# Patient Record
Sex: Female | Born: 1950 | Race: White | Hispanic: No | Smoking: Never smoker
Health system: Southern US, Community
[De-identification: ages and names within clinical notes are randomized; demographics above are authoritative.]

## PROBLEM LIST (undated history)

## (undated) DIAGNOSIS — M2550 Pain in unspecified joint: Secondary | ICD-10-CM

## (undated) DIAGNOSIS — I499 Cardiac arrhythmia, unspecified: Secondary | ICD-10-CM

## (undated) DIAGNOSIS — Z9889 Other specified postprocedural states: Secondary | ICD-10-CM

## (undated) DIAGNOSIS — M549 Dorsalgia, unspecified: Secondary | ICD-10-CM

## (undated) DIAGNOSIS — K829 Disease of gallbladder, unspecified: Secondary | ICD-10-CM

## (undated) DIAGNOSIS — E039 Hypothyroidism, unspecified: Secondary | ICD-10-CM

## (undated) DIAGNOSIS — E569 Vitamin deficiency, unspecified: Secondary | ICD-10-CM

## (undated) DIAGNOSIS — J189 Pneumonia, unspecified organism: Secondary | ICD-10-CM

## (undated) DIAGNOSIS — C801 Malignant (primary) neoplasm, unspecified: Secondary | ICD-10-CM

## (undated) DIAGNOSIS — R112 Nausea with vomiting, unspecified: Secondary | ICD-10-CM

## (undated) DIAGNOSIS — R519 Headache, unspecified: Secondary | ICD-10-CM

## (undated) DIAGNOSIS — G4730 Sleep apnea, unspecified: Secondary | ICD-10-CM

## (undated) DIAGNOSIS — M1990 Unspecified osteoarthritis, unspecified site: Secondary | ICD-10-CM

## (undated) DIAGNOSIS — I4891 Unspecified atrial fibrillation: Secondary | ICD-10-CM

## (undated) DIAGNOSIS — E785 Hyperlipidemia, unspecified: Secondary | ICD-10-CM

## (undated) DIAGNOSIS — E669 Obesity, unspecified: Secondary | ICD-10-CM

## (undated) DIAGNOSIS — K219 Gastro-esophageal reflux disease without esophagitis: Secondary | ICD-10-CM

## (undated) DIAGNOSIS — I10 Essential (primary) hypertension: Secondary | ICD-10-CM

## (undated) DIAGNOSIS — C50919 Malignant neoplasm of unspecified site of unspecified female breast: Secondary | ICD-10-CM

## (undated) DIAGNOSIS — E538 Deficiency of other specified B group vitamins: Secondary | ICD-10-CM

## (undated) DIAGNOSIS — G8929 Other chronic pain: Secondary | ICD-10-CM

## (undated) DIAGNOSIS — R600 Localized edema: Secondary | ICD-10-CM

## (undated) HISTORY — DX: Other chronic pain: G89.29

## (undated) HISTORY — DX: Malignant (primary) neoplasm, unspecified: C80.1

## (undated) HISTORY — DX: Obesity, unspecified: E66.9

## (undated) HISTORY — DX: Headache, unspecified: R51.9

## (undated) HISTORY — PX: CHOLECYSTECTOMY: SHX55

## (undated) HISTORY — DX: Deficiency of other specified B group vitamins: E53.8

## (undated) HISTORY — DX: Localized edema: R60.0

## (undated) HISTORY — DX: Essential (primary) hypertension: I10

## (undated) HISTORY — DX: Malignant neoplasm of unspecified site of unspecified female breast: C50.919

## (undated) HISTORY — DX: Vitamin deficiency, unspecified: E56.9

## (undated) HISTORY — DX: Unspecified atrial fibrillation: I48.91

## (undated) HISTORY — DX: Disease of gallbladder, unspecified: K82.9

## (undated) HISTORY — DX: Gastro-esophageal reflux disease without esophagitis: K21.9

## (undated) HISTORY — DX: Pain in unspecified joint: M25.50

## (undated) HISTORY — DX: Hyperlipidemia, unspecified: E78.5

## (undated) HISTORY — PX: CATARACT EXTRACTION: SUR2

## (undated) HISTORY — PX: DILATION AND CURETTAGE OF UTERUS: SHX78

## (undated) HISTORY — PX: BREAST SURGERY: SHX581

## (undated) HISTORY — PX: OTHER SURGICAL HISTORY: SHX169

## (undated) HISTORY — DX: Dorsalgia, unspecified: M54.9

---

## 1998-04-24 ENCOUNTER — Ambulatory Visit (HOSPITAL_COMMUNITY): Admission: RE | Admit: 1998-04-24 | Discharge: 1998-04-24 | Payer: Self-pay | Admitting: Family Medicine

## 1998-04-30 ENCOUNTER — Ambulatory Visit (HOSPITAL_COMMUNITY): Admission: RE | Admit: 1998-04-30 | Discharge: 1998-04-30 | Payer: Self-pay | Admitting: Oncology

## 1998-08-20 ENCOUNTER — Encounter: Payer: Self-pay | Admitting: General Surgery

## 1998-08-24 ENCOUNTER — Ambulatory Visit (HOSPITAL_COMMUNITY): Admission: RE | Admit: 1998-08-24 | Discharge: 1998-08-25 | Payer: Self-pay | Admitting: General Surgery

## 1998-08-24 ENCOUNTER — Encounter: Payer: Self-pay | Admitting: General Surgery

## 1999-03-26 ENCOUNTER — Other Ambulatory Visit: Admission: RE | Admit: 1999-03-26 | Discharge: 1999-03-26 | Payer: Self-pay | Admitting: General Surgery

## 1999-05-12 ENCOUNTER — Other Ambulatory Visit: Admission: RE | Admit: 1999-05-12 | Discharge: 1999-05-12 | Payer: Self-pay | Admitting: Obstetrics and Gynecology

## 1999-07-22 ENCOUNTER — Encounter: Payer: Self-pay | Admitting: *Deleted

## 1999-07-22 ENCOUNTER — Ambulatory Visit (HOSPITAL_COMMUNITY): Admission: RE | Admit: 1999-07-22 | Discharge: 1999-07-22 | Payer: Self-pay | Admitting: *Deleted

## 2000-09-13 ENCOUNTER — Other Ambulatory Visit: Admission: RE | Admit: 2000-09-13 | Discharge: 2000-09-13 | Payer: Self-pay | Admitting: Obstetrics and Gynecology

## 2000-09-19 ENCOUNTER — Encounter: Payer: Self-pay | Admitting: Obstetrics and Gynecology

## 2000-09-21 ENCOUNTER — Ambulatory Visit (HOSPITAL_COMMUNITY): Admission: RE | Admit: 2000-09-21 | Discharge: 2000-09-21 | Payer: Self-pay | Admitting: Obstetrics and Gynecology

## 2005-11-16 ENCOUNTER — Encounter: Payer: Self-pay | Admitting: Oncology

## 2013-07-23 ENCOUNTER — Encounter: Payer: PRIVATE HEALTH INSURANCE | Attending: Family Medicine | Admitting: *Deleted

## 2013-07-23 ENCOUNTER — Encounter: Payer: Self-pay | Admitting: *Deleted

## 2013-07-23 VITALS — Ht 68.0 in | Wt 349.6 lb

## 2013-07-23 DIAGNOSIS — Z713 Dietary counseling and surveillance: Secondary | ICD-10-CM | POA: Insufficient documentation

## 2013-07-23 DIAGNOSIS — E785 Hyperlipidemia, unspecified: Secondary | ICD-10-CM | POA: Insufficient documentation

## 2013-07-23 DIAGNOSIS — E669 Obesity, unspecified: Secondary | ICD-10-CM

## 2013-07-23 NOTE — Patient Instructions (Signed)
Plan:  Aim for 4 Carb Choices per meal (60 grams) +/- 1 either way  Aim for 0-2 Carbs per snack if hungry  Consider reading food labels for Total Carbohydrate of foods We will consider  increasing your activity in the future

## 2013-07-23 NOTE — Progress Notes (Signed)
  Medical Nutrition Therapy:  Appt start time: 0800 end time:  0900.  Assessment:  Primary concerns today: weight management and hyperlipidemia. She lives with her son, grandson and grand daughter. History of 100 pound weight loss in past, went back to work. 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 Went back to work so less time to walk and started eating high calorie foods. Sugar addictive.  States her son doesn't like lower calorie foods and is on bipolar meds which affects weight gain. 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  Can work on 1 behavior at a time. Wants to travel, has enlarged heart  Preferred Learning Style:   No preference indicated   Learning Readiness:   Contemplating  MEDICATIONS: see list   DIETARY INTAKE:  24-hr recall:  B ( AM): box of candy at work, occasionally a pkg PNB crackers, diet Coke in excess  Snk ( AM): yes, all day long  L ( PM): subway 6" sub, baked Lays and diet Coke Snk ( PM): none D ( PM): fast food: 2 pc fried chicken, fries, OR 2-3 large slices pizza OR large cheeseburger with fries, diet Coke or water Snk ( PM): occasionally ice cream Beverages: diet Coke, water  Usual physical activity: none ( has hip and knee pain )  Estimated energy needs: 2000 calories 225 g carbohydrates 150 g protein 56 g fat    Intervention:  Nutrition counseling for weight loss and hyperlipidemia initiated. Discussed Carb Counting and reading food labels as method of portion control. Patient expresses understanding of the benefits of increased activity but also verbalizes that she can only accomplish one goal at a time. She stated at end of visit that she "can do this" referring to the Carb Counting plan.  Plan:  Aim for 4 Carb Choices per meal (60 grams) +/- 1 either way  Aim for 0-2 Carbs per snack if hungry  Consider reading food labels for Total Carbohydrate of foods We will consider  increasing your activity in the  future  Teaching Method Utilized: Visual and Auditory  Handouts given during visit include: Carb Counting and Food Label handouts Meal Plan Card  Barriers to learning/adherence to lifestyle change: frustration of morbid obesity and living with extended family members that impacts her own food choices.  Demonstrated degree of understanding via:  Teach Back   Monitoring/Evaluation:  Dietary intake, exercise, reading food labels, and body weight in 2 week(s).

## 2013-08-06 ENCOUNTER — Encounter: Payer: PRIVATE HEALTH INSURANCE | Admitting: *Deleted

## 2013-08-06 VITALS — Ht 68.0 in | Wt 345.2 lb

## 2013-08-06 DIAGNOSIS — E785 Hyperlipidemia, unspecified: Secondary | ICD-10-CM

## 2013-08-06 NOTE — Progress Notes (Signed)
  Medical Nutrition Therapy:  Appt start time: 1730 end time:  1800.  Assessment:  Primary concerns today: weight management and hyperlipidemia follow up visit. Weight loss of 4 pounds in 2 weeks noted. She states she is disappointed with this weight loss in comparison to what she ultimately needs to lose. She acknowledges that over the last 2 weeks she has eaten less candy and is following the meal plan of 3-4 carb choices per meal. She had a milkshake but incorporated that into her carb choices. She also acknowledges that she would have gained 4 more pounds in the past 2 weeks so that adds up to a potential of an 8 pound improvement, she laughed at that comment.  Preferred Learning Style:   No preference indicated   Learning Readiness:   Contemplating  MEDICATIONS: see list   DIETARY INTAKE:  24-hr recall:  B ( AM): no more candy at work, occasionally a pkg PNB crackers, diet Coke in excess  Snk ( AM):not in the past 2 weeks L ( PM): subway 6" sub, 1/2 bag baked Lays and diet Coke Snk ( PM): none D ( PM): much healthier choices within the 4 Carb Choice plan Snk ( PM): no more ice cream Beverages: diet Coke, water  Usual physical activity: none ( has hip and knee pain )  Estimated energy needs: 2000 calories 225 g carbohydrates 150 g protein 56 g fat    Intervention:  Commended her on her success of 4 pound weight loss and her diligence to follow the meal plan. Suggested we work on exercise as the next step and she suggested and agreed to her walking down the long hallway at her work once or twice a day, which is more than she was doing. Also suggested arm chair exercises during commercials while she is watching tv.  Plan:  Continue to aim for 4 Carb Choices per meal (60 grams) +/- 1 either way  Continue to aim for 0-2 Carbs per snack if hungry  Continue reading food labels for Total Carbohydrate of foods Consider  increasing your activity by walking the hall at work and doing  armchair exercises during commercials when at home.  Teaching Method Utilized: Visual and Auditory  Handouts given during visit include: No new handouts today  Barriers to learning/adherence to lifestyle change: frustration of morbid obesity and living with extended family members that impacts her own food choices.  Demonstrated degree of understanding via:  Teach Back   Monitoring/Evaluation:  Dietary intake, exercise, reading food labels, and body weight in 2 week(s).

## 2013-08-06 NOTE — Patient Instructions (Signed)
Plan:  Continue to aim for 4 Carb Choices per meal (60 grams) +/- 1 either way  Continue to aim for 0-2 Carbs per snack if hungry  Continue reading food labels for Total Carbohydrate of foods Consider  increasing your activity by walking the hall at work and doing armchair exercises during commercials when at home.

## 2013-08-22 ENCOUNTER — Encounter: Payer: PRIVATE HEALTH INSURANCE | Attending: Family Medicine | Admitting: *Deleted

## 2013-08-22 DIAGNOSIS — Z713 Dietary counseling and surveillance: Secondary | ICD-10-CM | POA: Insufficient documentation

## 2013-08-22 DIAGNOSIS — E785 Hyperlipidemia, unspecified: Secondary | ICD-10-CM | POA: Insufficient documentation

## 2013-08-22 NOTE — Progress Notes (Signed)
  Medical Nutrition Therapy:  Appt start time: 1730 end time:  1800.  Assessment:  Primary concerns today: weight management and hyperlipidemia follow up visit. Weight loss of 1.5 pounds in 3 weeks noted. She states she is disappointed with this weight loss in comparison to what she ultimately needs to lose. She has walked more at work, at least once a day down the main hallway, she forgot about the arm chair exercises until a couple of days ago. She is confident that carb counting is going well, we may adjust portion per meal to better reflect her smaller portion sizes.  Preferred Learning Style:   No preference indicated   Learning Readiness:   Contemplating  MEDICATIONS: see list   DIETARY INTAKE:  24-hr recall:  B ( AM): no more candy at work, occasionally a pkg PNB crackers, diet Coke in excess  Snk ( AM):not in the past 2 weeks L ( PM): subway 6" sub, 1/2 bag baked Lays and diet Coke Snk ( PM): none D ( PM): much healthier choices within the 4 Carb Choice plan Snk ( PM): no more ice cream Beverages: diet Coke, water  Usual physical activity: she states she walked down the hallway at work once each day, which is more than she did before.  Estimated energy needs: 2000 calories 225 g carbohydrates 150 g protein 56 g fat    Intervention:  Commended her on her success of 1.5 pound weight loss and her diligence to follow the meal plan. Explained to her the relationship of her level of exercise in relation to the weight she is working to lose. Explained that she will need to work up to 30 minutes a day 5 days a week to see consistent weight loss. Also acknowledged that she needs to start small, but to make daily exercise part of her routine. She agreed to walking down the long hallway at her work at least twice a day. Also reminded her of the arm chair exercises that could be done during commercials while she is watching tv at home.  Plan:  Continue to aim for 4 Carb Choices per  meal (60 grams) +/- 1 either way  Continue to aim for 0-2 Carbs per snack if hungry  Continue reading food labels for Total Carbohydrate of foods Consider  increasing your activity by walking the hall at work and doing armchair exercises during commercials when at home.  Teaching Method Utilized: Visual and Auditory  Handouts given during visit include: No new handouts today  Barriers to learning/adherence to lifestyle change: frustration of morbid obesity and living with extended family members that impacts her own food choices.  Demonstrated degree of understanding via:  Teach Back   Monitoring/Evaluation:  Dietary intake, exercise, reading food labels, and body weight in 2 week(s) via e-mail communication this time per patient request.

## 2013-09-05 ENCOUNTER — Encounter: Payer: Self-pay | Admitting: *Deleted

## 2013-09-05 ENCOUNTER — Encounter: Payer: PRIVATE HEALTH INSURANCE | Admitting: *Deleted

## 2013-09-05 NOTE — Progress Notes (Signed)
  Medical Nutrition Therapy:  Appt start time: 1030 end time:  1100.  Assessment:  Primary concerns today: weight management and hyperlipidemia follow up visit. She is happy with weight loss of 6 pounds since last visit  2 weeks ago! Still not walking hallway at work, has been home more this past week due to vacation time and weather issues. She has done Arm Chair Exercises on occasion at home. She states she tried to walk outside but walking on pavement was painful to her joints compared to walking on carpet at work. She is using [employer]itness [employer] APP and brought the printout to this visit.   Preferred Learning Style:   No preference indicated   Learning Readiness:   Contemplating  MEDICATIONS: see list   DIETARY INTAKE:  24-hr recall:  B ( AM): no more candy at work, occasionally a pkg PNB crackers, diet Coke in excess  Snk ( AM):not in the past 2 weeks L ( PM): subway 6" sub, 1/2 bag baked Lays and diet Coke Snk ( PM): none D ( PM): much healthier choices within the 4 Carb Choice plan Snk ( PM): no more ice cream Beverages: diet Coke, water  Usual physical activity: she states she walked down the hallway at work once each day, which is more than she did before.  Estimated energy needs: 2000 calories 225 g carbohydrates 150 g protein 56 g fat    Intervention:  Commended her on her success of 6 pound weight loss and her diligence to follow the meal plan. Reminded her of the relationship of her level of exercise in relation to the weight she is working to lose. Explained that she will need to work up to 30 minutes a day 5 days a week to see consistent weight loss. Also acknowledged that she needs to start small, but to make daily exercise part of her routine. She agreed to try walking down the long hallway at her work at least twice a day. Also reminded her again of the arm chair exercises that could be done during commercials while she is watching tv at home.  Plan:  Continue to aim  for 3 Carb Choices per meal (45 grams) +/- 1 either way  Continue to aim for 0-2 Carbs per snack if hungry  Continue reading food labels for Total Carbohydrate of foods Again consider increasing your activity by walking the hall at work and doing armchair exercises during commercials when at home.   Teaching Method Utilized: Visual and Auditory  Handouts given during visit include: No new handouts today  Barriers to learning/adherence to lifestyle change: frustration of morbid obesity and living with extended family members that impacts her own food choices.  Demonstrated degree of understanding via:  Teach Back   Monitoring/Evaluation:  Dietary intake, exercise, reading food labels, and body weight in 4 week(s)

## 2013-09-05 NOTE — Patient Instructions (Signed)
Plan:  Continue to aim for 3 Carb Choices per meal (45 grams) +/- 1 either way  Continue to aim for 0-2 Carbs per snack if hungry  Continue reading food labels for Total Carbohydrate of foods Again consider increasing your activity by walking the hall at work and doing armchair exercises during commercials when at home.

## 2013-10-18 ENCOUNTER — Ambulatory Visit: Payer: PRIVATE HEALTH INSURANCE | Admitting: *Deleted

## 2013-10-21 ENCOUNTER — Other Ambulatory Visit: Payer: Self-pay | Admitting: Family Medicine

## 2013-10-21 ENCOUNTER — Other Ambulatory Visit (HOSPITAL_COMMUNITY)
Admission: RE | Admit: 2013-10-21 | Discharge: 2013-10-21 | Disposition: A | Discharge status: Home / Self Care | Payer: PRIVATE HEALTH INSURANCE | Source: Ambulatory Visit | Attending: Family Medicine | Admitting: Family Medicine

## 2013-10-21 DIAGNOSIS — Z124 Encounter for screening for malignant neoplasm of cervix: Secondary | ICD-10-CM | POA: Insufficient documentation

## 2013-10-21 DIAGNOSIS — Z1151 Encounter for screening for human papillomavirus (HPV): Secondary | ICD-10-CM | POA: Insufficient documentation

## 2013-10-25 ENCOUNTER — Other Ambulatory Visit: Payer: Self-pay | Admitting: Gastroenterology

## 2014-06-26 ENCOUNTER — Encounter (HOSPITAL_COMMUNITY): Payer: Self-pay | Admitting: *Deleted

## 2014-06-26 ENCOUNTER — Other Ambulatory Visit: Payer: Self-pay

## 2014-06-26 ENCOUNTER — Ambulatory Visit (HOSPITAL_COMMUNITY): Payer: PRIVATE HEALTH INSURANCE | Admitting: Anesthesiology

## 2014-06-26 ENCOUNTER — Ambulatory Visit (HOSPITAL_COMMUNITY)
Admission: AD | Admit: 2014-06-26 | Discharge: 2014-06-26 | Disposition: A | Discharge status: Home / Self Care | Payer: PRIVATE HEALTH INSURANCE | Source: Ambulatory Visit | Attending: Cardiology | Admitting: Cardiology

## 2014-06-26 ENCOUNTER — Encounter (HOSPITAL_COMMUNITY)
Admission: AD | Disposition: A | Discharge status: Home / Self Care | Payer: Self-pay | Source: Ambulatory Visit | Attending: Cardiology

## 2014-06-26 DIAGNOSIS — I4891 Unspecified atrial fibrillation: Secondary | ICD-10-CM | POA: Insufficient documentation

## 2014-06-26 DIAGNOSIS — I10 Essential (primary) hypertension: Secondary | ICD-10-CM | POA: Diagnosis not present

## 2014-06-26 HISTORY — DX: Other specified postprocedural states: Z98.890

## 2014-06-26 HISTORY — PX: CARDIOVERSION: SHX1299

## 2014-06-26 HISTORY — DX: Other specified postprocedural states: R11.2

## 2014-06-26 LAB — POCT I-STAT 4, (NA,K, GLUC, HGB,HCT)
GLUCOSE: 105 mg/dL — AB (ref 70–99)
HCT: 47 % — ABNORMAL HIGH (ref 36.0–46.0)
HEMOGLOBIN: 16 g/dL — AB (ref 12.0–15.0)
Potassium: 3.7 mEq/L (ref 3.7–5.3)
SODIUM: 140 meq/L (ref 137–147)

## 2014-06-26 SURGERY — CARDIOVERSION
Anesthesia: Monitor Anesthesia Care

## 2014-06-26 MED ORDER — METOPROLOL TARTRATE 1 MG/ML IV SOLN
INTRAVENOUS | Status: AC
  Filled 2014-06-26: qty 5

## 2014-06-26 MED ORDER — SODIUM CHLORIDE 0.9 % IV SOLN
INTRAVENOUS | Status: DC
  Administered 2014-06-26: 500 mL via INTRAVENOUS

## 2014-06-26 MED ORDER — SODIUM CHLORIDE 0.9 % IV SOLN
INTRAVENOUS | Status: DC | PRN
  Administered 2014-06-26: 16:00:00 via INTRAVENOUS

## 2014-06-26 MED ORDER — RIVAROXABAN 20 MG PO TABS: 20.0000 mg | ORAL_TABLET | Freq: Every day | ORAL | Status: DC

## 2014-06-26 MED ORDER — METOPROLOL TARTRATE 50 MG PO TABS: 50.0000 mg | ORAL_TABLET | Freq: Every day | ORAL | Status: DC

## 2014-06-26 MED ORDER — METOPROLOL TARTRATE 1 MG/ML IV SOLN
5.0000 mg | Freq: Once | INTRAVENOUS | Status: AC
  Administered 2014-06-26: 5 mg via INTRAVENOUS

## 2014-06-26 MED ORDER — PROPOFOL 10 MG/ML IV BOLUS
INTRAVENOUS | Status: DC | PRN
  Administered 2014-06-26: 100 mg via INTRAVENOUS

## 2014-06-26 NOTE — Discharge Instructions (Signed)

## 2014-06-26 NOTE — Interval H&P Note (Signed)
History and Physical Interval Note:  06/26/2014 4:09 PM  Robin Christensen  has presented today for surgery, with the diagnosis of a fib  The various methods of treatment have been discussed with the patient and family. After consideration of risks, benefits and other options for treatment, the patient has consented to  Procedure(s): CARDIOVERSION (N/A) as a surgical intervention .  The patient's history has been reviewed, patient examined, no change in status, stable for surgery.  I have reviewed the patient's chart and labs.  Questions were answered to the patient's satisfaction.     Laverda Page

## 2014-06-26 NOTE — H&P (Signed)
  Please see office visit notes for complete details of HPI.  

## 2014-06-26 NOTE — CV Procedure (Signed)
Direct current cardioversion:  Indication symptomatic A. Fibrillation.  Procedure: Using 100 mg of IV Propofol and 40 IV Lidocaine (for reducing venous pain) for achieving deep sedation, synchronized direct current cardioversion performed. Patient was delivered with 100 Joules of electricity X 1 then 120 Jx1 with success to NSR. Patient tolerated the procedure well. No immediate complication noted.

## 2014-06-26 NOTE — Anesthesia Preprocedure Evaluation (Addendum)
Anesthesia Evaluation  Patient identified by MRN, date of birth, ID band Patient awake    Reviewed: Allergy & Precautions, H&P , NPO status , Patient's Chart, lab work & pertinent test results  History of Anesthesia Complications (+) PONV and history of anesthetic complications  Airway Mallampati: II  TM Distance: <3 FB Neck ROM: Full    Dental   Pulmonary    Pulmonary exam normal       Cardiovascular hypertension, Pt. on medications Rhythm:Irregular Rate:Abnormal     Neuro/Psych    GI/Hepatic   Endo/Other    Renal/GU      Musculoskeletal   Abdominal Normal abdominal exam  (+)   Peds  Hematology   Anesthesia Other Findings   Reproductive/Obstetrics                            Anesthesia Physical Anesthesia Plan  ASA: III  Anesthesia Plan: General   Post-op Pain Management:    Induction: Intravenous  Airway Management Planned: Mask  Additional Equipment:   Intra-op Plan:   Post-operative Plan:   Informed Consent: I have reviewed the patients History and Physical, chart, labs and discussed the procedure including the risks, benefits and alternatives for the proposed anesthesia with the patient or authorized representative who has indicated his/her understanding and acceptance.   Dental advisory given  Plan Discussed with: CRNA and Surgeon  Anesthesia Plan Comments:        Anesthesia Quick Evaluation

## 2014-06-26 NOTE — Transfer of Care (Signed)
Immediate Anesthesia Transfer of Care Note  Patient: Robin Christensen  Procedure(s) Performed: Procedure(s): CARDIOVERSION (N/A)  Patient Location: PACU  Anesthesia Type:MAC  Level of Consciousness: awake, alert  and oriented  Airway & Oxygen Therapy: Patient Spontanous Breathing and Patient connected to nasal cannula oxygen  Post-op Assessment: Report given to PACU RN  Post vital signs: Reviewed and stable  Complications: No apparent anesthesia complications

## 2014-06-26 NOTE — Anesthesia Postprocedure Evaluation (Signed)
  Anesthesia Post-op Note  Patient: Robin Christensen  Procedure(s) Performed: Procedure(s): CARDIOVERSION (N/A)  Patient Location: Endoscopy Unit  Anesthesia Type:MAC  Level of Consciousness: awake, alert  and oriented  Airway and Oxygen Therapy: Patient Spontanous Breathing and Patient connected to nasal cannula oxygen  Post-op Pain: none  Post-op Assessment: Post-op Vital signs reviewed, Patient's Cardiovascular Status Stable, Respiratory Function Stable, Patent Airway, No signs of Nausea or vomiting and Pain level controlled  Post-op Vital Signs: Reviewed and stable  Last Vitals:  Filed Vitals:   06/26/14 1449  BP: 104/83  Resp: 12    Complications: No apparent anesthesia complications

## 2014-06-27 ENCOUNTER — Encounter (HOSPITAL_COMMUNITY): Payer: Self-pay | Admitting: Cardiology

## 2014-08-28 ENCOUNTER — Ambulatory Visit: Payer: PRIVATE HEALTH INSURANCE | Admitting: *Deleted

## 2014-09-18 ENCOUNTER — Encounter: Payer: PRIVATE HEALTH INSURANCE | Attending: Family Medicine | Admitting: *Deleted

## 2014-09-18 ENCOUNTER — Encounter: Payer: Self-pay | Admitting: *Deleted

## 2014-09-18 VITALS — Ht 68.0 in | Wt 285.3 lb

## 2014-09-18 DIAGNOSIS — Z713 Dietary counseling and surveillance: Secondary | ICD-10-CM | POA: Insufficient documentation

## 2014-09-18 DIAGNOSIS — E669 Obesity, unspecified: Secondary | ICD-10-CM | POA: Insufficient documentation

## 2014-09-18 DIAGNOSIS — Z6841 Body Mass Index (BMI) 40.0 and over, adult: Secondary | ICD-10-CM | POA: Insufficient documentation

## 2014-09-18 NOTE — Patient Instructions (Signed)
Plan:  Continue to aim for 2-3 Carb Choices per meal (30-45 grams)   Continue to aim for 0-1 Carbs per snack if hungry  Continue reading food labels for Total Carbohydrate of foods Continue with your activity walking outside with your Fitbit to monitor your success, increase intensity or time as tolerated at least 3 days a week

## 2014-09-18 NOTE — Progress Notes (Signed)
  Medical Nutrition Therapy:  Appt start time: 0830 end time:  0915.  Assessment:  Primary concerns today: 09/18/2014. Patient was last seen here in February, 2015. Her insurance covers 3 visits a year and that was her 4th visit in 2015 so she has postponed her follow up until today. Weight loss of over 50 pounds since then noted! She states she has support through her insurance company of a program called [employer] which promotes behavior modification techniques. 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 She is also active with her co-workers 3 days a week with weight loss challenges and planned activities they do together. She is planning to retire the end of August and she plans to attend the [facility] then so she can include water exercises in her activity plan.  Preferred Learning Style:   No preference indicated   Learning Readiness:   Contemplating  MEDICATIONS: see list   DIETARY INTAKE:  24-hr recall:  B ( AM): diet sandwich of 2 pieces of low calorie bread, lean deli meat, 40 calorie cheese, light mayo, diet soda  Snk ( AM):not anymore L ( PM): subway 6" sub, (no more baked Lays), has small bag of apples and water Snk ( PM): none D ( PM): varies, but mainly lean protein, vegetables and small serving of starch OR salad with lean meat added, occasionally with croutons, diet soda Snk ( PM): very rarely, if so a light yogurt Beverages: diet Coke, water  Usual physical activity: she is enjoying walking outside more now and is able to walk at least 3 days a week. Plans to increase that frequency as the weather improves.  Estimated energy needs: 1600 calories 180 g carbohydrates 120 g protein 44 g fat    Intervention:  Commended her on her wonderful success of losing over 50 pounds! Per her current diet history she is eating  appropriately and her activity level has increased significantly this past year. I have encouraged her to continue with both, and to consider increasing either the time or the intensity of her exercise as tolerated for continued weight loss. I offered to weigh her on the Tanita Scale today to differentiate the % of fat mass as a baseline that we can follow with future appointments:    TANITA  BODY COMP RESULTS 09/18/2014  Weight (lbs) 286.0   BMI (kg/m^2) 43.5   Fat Mass (lbs) 142.5    Fat Free Mass (lbs) 143.5    Total Body Water (lbs) 105.0   Plan:  Continue to aim for 2-3 Carb Choices per meal (30-45 grams)   Continue to aim for 0-1 Carbs per snack if hungry  Continue reading food labels for Total Carbohydrate of foods Continue with your activity walking outside with your Fitbit to monitor your success, increase intensity or time as tolerated at least 3 days a week   Teaching Method Utilized: Visual and Auditory  Handouts given during visit include: Copy of Tanita Scale Report  Barriers to learning/adherence to lifestyle change: no barriers today, she has overcome some of her frustration with her successes this past year.  Demonstrated degree of understanding via:  Teach Back   Monitoring/Evaluation:  Dietary intake, exercise, reading food labels, and body weight in 2 months

## 2014-11-20 ENCOUNTER — Encounter: Payer: PRIVATE HEALTH INSURANCE | Attending: Family Medicine | Admitting: *Deleted

## 2014-11-20 ENCOUNTER — Encounter: Payer: Self-pay | Admitting: *Deleted

## 2014-11-20 DIAGNOSIS — Z713 Dietary counseling and surveillance: Secondary | ICD-10-CM | POA: Insufficient documentation

## 2014-11-20 DIAGNOSIS — E669 Obesity, unspecified: Secondary | ICD-10-CM | POA: Insufficient documentation

## 2014-11-20 DIAGNOSIS — Z6841 Body Mass Index (BMI) 40.0 and over, adult: Secondary | ICD-10-CM | POA: Insufficient documentation

## 2014-11-20 NOTE — Progress Notes (Signed)
  Medical Nutrition Therapy:  Appt start time: 0830 end time:  0915.  Assessment:  Primary concerns today: 11/20/2014. Weight loss of 5 pounds noted! Just finished the support through her insurance company of a program called [employer] which promotes behavior modification techniques. She states her FBG decreased from 103 to 94 mg/dl at the end of the program. 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 She is still  planning to retire the end of August.  Preferred Learning Style:   No preference indicated   Learning Readiness:   Contemplating  MEDICATIONS: see list   DIETARY INTAKE:  24-hr recall:  B ( AM): diet sandwich of 2 pieces of low calorie bread, lean deli meat, 40 calorie cheese, light mayo, diet soda  Snk ( AM):not anymore L ( PM): subway 6" sub, (no more baked Lays), has small bag of apples and water Snk ( PM): none D ( PM): varies, but mainly lean protein, vegetables and small serving of starch OR salad with lean meat added, occasionally with croutons, diet soda Snk ( PM): very rarely, if so a light yogurt Beverages: diet Coke, water  Usual physical activity: she is enjoying walking outside more now and is able to walk at least 3 days a week. Plans to increase that frequency as the weather improves.  Estimated energy needs: 1600 calories 180 g carbohydrates 120 g protein 44 g fat    Intervention:  Commended her on her wonderful success of losing over 50 pounds, 5 of which are since her last visit here on 09/18/14. I reminded her that she is doing very well with the important behaviors of portion control and exercise! Per below she has lost 3 pounds of fat mass and her fat free mass loss includes 3 pounds less water than last visit.  TANITA  BODY COMP RESULTS 09/18/2014  Weight (lbs)  286.0   BMI (kg/m^2) 43.5   Fat Mass (lbs) 142.5    Fat Free Mass (lbs) 143.5    Total Body Water (lbs) 105.0   TANITA  BODY COMP RESULTS 11/20/14  Weight (lbs) 280.5   BMI (kg/m^2) 42.6   Fat Mass (lbs) 141.0   Fat Free Mass (lbs) 139.5   Total Body Water (lbs) 102.0     Plan:  Continue to aim for 2-3 Carb Choices per meal (30-45 grams)   Continue to aim for 0-1 Carbs per snack if hungry  Continue reading food labels for Total Carbohydrate of foods Continue with your activity walking outside with your Fitbit to monitor your success, increase intensity or time as tolerated at least 3 days a week GREAT JOB!   Teaching Method Utilized: Visual and Auditory  Handouts given during visit include: Copy of Tanita Scale Report  Barriers to learning/adherence to lifestyle change: no barriers today, she has overcome some of her frustration with her successes this past year.  Demonstrated degree of understanding via:  Teach Back   Monitoring/Evaluation:  Dietary intake, exercise, reading food labels, and body weight in 3 months

## 2014-11-20 NOTE — Patient Instructions (Signed)
Plan:  Continue to aim for 2-3 Carb Choices per meal (30-45 grams)   Continue to aim for 0-1 Carbs per snack if hungry  Continue reading food labels for Total Carbohydrate of foods Continue with your activity walking outside with your Fitbit to monitor your success, increase intensity or time as tolerated at least 3 days a week GREAT [employer]!

## 2014-12-22 ENCOUNTER — Ambulatory Visit
Admission: RE | Admit: 2014-12-22 | Discharge: 2014-12-22 | Disposition: A | Discharge status: Home / Self Care | Payer: PRIVATE HEALTH INSURANCE | Source: Ambulatory Visit | Attending: Family Medicine | Admitting: Family Medicine

## 2014-12-22 ENCOUNTER — Other Ambulatory Visit: Payer: Self-pay | Admitting: Family Medicine

## 2014-12-22 DIAGNOSIS — M25561 Pain in right knee: Secondary | ICD-10-CM

## 2015-02-12 ENCOUNTER — Encounter: Payer: PRIVATE HEALTH INSURANCE | Attending: Family Medicine | Admitting: *Deleted

## 2015-02-12 DIAGNOSIS — Z713 Dietary counseling and surveillance: Secondary | ICD-10-CM | POA: Insufficient documentation

## 2015-02-12 DIAGNOSIS — E669 Obesity, unspecified: Secondary | ICD-10-CM | POA: Diagnosis present

## 2015-02-12 DIAGNOSIS — Z6841 Body Mass Index (BMI) 40.0 and over, adult: Secondary | ICD-10-CM | POA: Diagnosis not present

## 2015-02-12 NOTE — Progress Notes (Signed)
Medical Nutrition Therapy:  Appt start time: 0730 end time:  0800.  Assessment:  Primary concerns today: 02/12/2015. Weight loss of 10 pounds noted! She states she has recently moved to [place] and bought a townhouse which she states she loves. She is looking forward to retiring the end of August. She states she took a "stay at home" vacation and walked about 2 miles each day that week. She has located a [employer] near her new home and plans to join when she retires so she can swim there too.    Preferred Learning Style:   No preference indicated   Learning Readiness:   Contemplating  MEDICATIONS: see list   DIETARY INTAKE:  24-hr recall:  B ( AM): diet sandwich of 2 pieces of low calorie bread, lean deli meat, 40 calorie cheese, light mayo, diet soda  Snk ( AM):not anymore L ( PM): subway 6" sub, (no more baked Lays), has small bag of apples and water Snk ( PM): none D ( PM): varies, but mainly lean protein, vegetables and small serving of starch OR salad with lean meat added, occasionally with croutons, diet soda Snk ( PM): very rarely, if so a light yogurt Beverages: diet Coke, water  Usual physical activity: she is walking outside in the early morning due to the heat, when she can.  Estimated energy needs: 1600 calories 180 g carbohydrates 120 g protein 44 g fat    Intervention:  Commended her on her wonderful success of losing over 50 pounds, 5 of which are since her last visit here on 09/18/14. I reminded her that she is doing very well with the important behaviors of portion control and exercise! Per below she has lost 3 pounds of fat mass and her fat free mass loss includes 3 pounds less water than last visit.  TANITA  BODY COMP RESULTS 09/18/2014  Weight (lbs) 286.0   BMI (kg/m^2) 43.5   Fat Mass (lbs) 142.5    Fat Free Mass (lbs) 143.5    Total Body Water (lbs) 105.0   TANITA  BODY COMP RESULTS 11/20/14  Weight (lbs) 280.5   BMI (kg/m^2) 42.6   Fat Mass (lbs) 141.0    Fat Free Mass (lbs) 139.5   Total Body Water (lbs) 102.0   TANITA  BODY COMP RESULTS  Weight (lbs) 269   BMI (kg/m^2) 40.9   Fat Mass (lbs) 137.5   Fat Free Mass (lbs) 131.5   Total Body Water (lbs) 96.5   Plan:  Continue to aim for 2-3 Carb Choices per meal (30-45 grams)   Continue to aim for 0-1 Carbs per snack if hungry  Continue reading food labels for Total Carbohydrate of foods Continue with your activity walking outside with your Fitbit to monitor your success, increase intensity or time as tolerated at least 3 days a week Consider using My Fitness Pal again to help document your food and activity level more accurately and increase your awareness of choices. GREAT JOB, I'M PROUD OF YOU!   Teaching Method Utilized: Visual and Auditory  Handouts given during visit include: Copy of [place] Scale Report  Barriers to learning/adherence to lifestyle change: no barriers today, she has overcome some of her frustration with her successes this past year.  Demonstrated degree of understanding via:  Teach Back   Monitoring/Evaluation:  Dietary intake, exercise, reading food labels, and body weight PRN. Her insurance allowed 3 visits per year, so she will not return unless her new insurance (after retirement) will cover the  visit.

## 2015-02-12 NOTE — Patient Instructions (Signed)
Plan:  Continue to aim for 2-3 Carb Choices per meal (30-45 grams)   Continue to aim for 0-1 Carbs per snack if hungry  Continue reading food labels for Total Carbohydrate of foods Continue with your activity walking outside with your Fitbit to monitor your success, increase intensity or time as tolerated at least 3 days a week Consider using My Fitness Pal again to help document your food and activity level more accurately and increase your awareness of choices. GREAT JOB, I'M PROUD OF YOU!

## 2015-03-24 DIAGNOSIS — R112 Nausea with vomiting, unspecified: Secondary | ICD-10-CM | POA: Diagnosis not present

## 2015-03-24 DIAGNOSIS — J329 Chronic sinusitis, unspecified: Secondary | ICD-10-CM | POA: Diagnosis not present

## 2015-04-24 DIAGNOSIS — E662 Morbid (severe) obesity with alveolar hypoventilation: Secondary | ICD-10-CM | POA: Diagnosis not present

## 2015-04-24 DIAGNOSIS — K219 Gastro-esophageal reflux disease without esophagitis: Secondary | ICD-10-CM | POA: Diagnosis not present

## 2015-04-24 DIAGNOSIS — I480 Paroxysmal atrial fibrillation: Secondary | ICD-10-CM | POA: Diagnosis not present

## 2015-04-24 DIAGNOSIS — Z6841 Body Mass Index (BMI) 40.0 and over, adult: Secondary | ICD-10-CM | POA: Diagnosis not present

## 2015-04-27 DIAGNOSIS — E039 Hypothyroidism, unspecified: Secondary | ICD-10-CM | POA: Diagnosis not present

## 2015-04-27 DIAGNOSIS — I4891 Unspecified atrial fibrillation: Secondary | ICD-10-CM | POA: Diagnosis not present

## 2015-04-27 DIAGNOSIS — I10 Essential (primary) hypertension: Secondary | ICD-10-CM | POA: Diagnosis not present

## 2015-04-27 DIAGNOSIS — E785 Hyperlipidemia, unspecified: Secondary | ICD-10-CM | POA: Diagnosis not present

## 2015-04-27 DIAGNOSIS — Z23 Encounter for immunization: Secondary | ICD-10-CM | POA: Diagnosis not present

## 2015-04-27 DIAGNOSIS — M2550 Pain in unspecified joint: Secondary | ICD-10-CM | POA: Diagnosis not present

## 2015-04-27 DIAGNOSIS — R739 Hyperglycemia, unspecified: Secondary | ICD-10-CM | POA: Diagnosis not present

## 2015-05-07 DIAGNOSIS — M1712 Unilateral primary osteoarthritis, left knee: Secondary | ICD-10-CM | POA: Diagnosis not present

## 2015-05-07 DIAGNOSIS — M79641 Pain in right hand: Secondary | ICD-10-CM | POA: Diagnosis not present

## 2015-05-07 DIAGNOSIS — M1711 Unilateral primary osteoarthritis, right knee: Secondary | ICD-10-CM | POA: Diagnosis not present

## 2015-05-15 ENCOUNTER — Other Ambulatory Visit: Payer: Self-pay | Admitting: Family Medicine

## 2015-05-15 ENCOUNTER — Ambulatory Visit
Admission: RE | Admit: 2015-05-15 | Discharge: 2015-05-15 | Disposition: A | Discharge status: Home / Self Care | Payer: Commercial Managed Care - HMO | Source: Ambulatory Visit | Attending: Family Medicine | Admitting: Family Medicine

## 2015-05-15 DIAGNOSIS — M79604 Pain in right leg: Secondary | ICD-10-CM

## 2015-05-15 DIAGNOSIS — M79661 Pain in right lower leg: Secondary | ICD-10-CM | POA: Diagnosis not present

## 2015-06-06 DIAGNOSIS — M170 Bilateral primary osteoarthritis of knee: Secondary | ICD-10-CM | POA: Diagnosis not present

## 2015-06-15 DIAGNOSIS — M170 Bilateral primary osteoarthritis of knee: Secondary | ICD-10-CM | POA: Diagnosis not present

## 2015-06-22 DIAGNOSIS — M170 Bilateral primary osteoarthritis of knee: Secondary | ICD-10-CM | POA: Diagnosis not present

## 2015-07-22 DIAGNOSIS — M1711 Unilateral primary osteoarthritis, right knee: Secondary | ICD-10-CM | POA: Diagnosis not present

## 2015-08-19 DIAGNOSIS — M170 Bilateral primary osteoarthritis of knee: Secondary | ICD-10-CM | POA: Diagnosis not present

## 2015-09-24 DIAGNOSIS — E039 Hypothyroidism, unspecified: Secondary | ICD-10-CM | POA: Diagnosis not present

## 2015-09-24 DIAGNOSIS — R8290 Unspecified abnormal findings in urine: Secondary | ICD-10-CM | POA: Diagnosis not present

## 2015-09-24 DIAGNOSIS — R109 Unspecified abdominal pain: Secondary | ICD-10-CM | POA: Diagnosis not present

## 2015-10-14 DIAGNOSIS — Z0100 Encounter for examination of eyes and vision without abnormal findings: Secondary | ICD-10-CM | POA: Diagnosis not present

## 2015-10-14 DIAGNOSIS — H5210 Myopia, unspecified eye: Secondary | ICD-10-CM | POA: Diagnosis not present

## 2015-10-15 ENCOUNTER — Other Ambulatory Visit: Payer: Self-pay | Admitting: Family Medicine

## 2015-10-15 DIAGNOSIS — R1084 Generalized abdominal pain: Secondary | ICD-10-CM

## 2015-10-26 ENCOUNTER — Ambulatory Visit
Admission: RE | Admit: 2015-10-26 | Discharge: 2015-10-26 | Disposition: A | Discharge status: Home / Self Care | Payer: Commercial Managed Care - HMO | Source: Ambulatory Visit | Attending: Family Medicine | Admitting: Family Medicine

## 2015-10-26 DIAGNOSIS — R109 Unspecified abdominal pain: Secondary | ICD-10-CM | POA: Diagnosis not present

## 2015-10-26 DIAGNOSIS — R102 Pelvic and perineal pain: Secondary | ICD-10-CM | POA: Diagnosis not present

## 2015-10-26 DIAGNOSIS — R1084 Generalized abdominal pain: Secondary | ICD-10-CM

## 2015-10-28 DIAGNOSIS — I480 Paroxysmal atrial fibrillation: Secondary | ICD-10-CM | POA: Diagnosis not present

## 2015-10-28 DIAGNOSIS — Z6837 Body mass index (BMI) 37.0-37.9, adult: Secondary | ICD-10-CM | POA: Diagnosis not present

## 2015-11-09 DIAGNOSIS — S83207A Unspecified tear of unspecified meniscus, current injury, left knee, initial encounter: Secondary | ICD-10-CM | POA: Diagnosis not present

## 2015-11-09 DIAGNOSIS — S83206A Unspecified tear of unspecified meniscus, current injury, right knee, initial encounter: Secondary | ICD-10-CM | POA: Diagnosis not present

## 2015-11-11 DIAGNOSIS — Z0000 Encounter for general adult medical examination without abnormal findings: Secondary | ICD-10-CM | POA: Diagnosis not present

## 2015-11-11 DIAGNOSIS — I10 Essential (primary) hypertension: Secondary | ICD-10-CM | POA: Diagnosis not present

## 2015-11-11 DIAGNOSIS — Z23 Encounter for immunization: Secondary | ICD-10-CM | POA: Diagnosis not present

## 2015-11-11 DIAGNOSIS — I4891 Unspecified atrial fibrillation: Secondary | ICD-10-CM | POA: Diagnosis not present

## 2015-11-11 DIAGNOSIS — H9190 Unspecified hearing loss, unspecified ear: Secondary | ICD-10-CM | POA: Diagnosis not present

## 2015-11-11 DIAGNOSIS — E785 Hyperlipidemia, unspecified: Secondary | ICD-10-CM | POA: Diagnosis not present

## 2015-12-01 DIAGNOSIS — H903 Sensorineural hearing loss, bilateral: Secondary | ICD-10-CM | POA: Diagnosis not present

## 2015-12-01 DIAGNOSIS — H93293 Other abnormal auditory perceptions, bilateral: Secondary | ICD-10-CM | POA: Insufficient documentation

## 2015-12-07 DIAGNOSIS — G4733 Obstructive sleep apnea (adult) (pediatric): Secondary | ICD-10-CM | POA: Diagnosis not present

## 2016-02-09 DIAGNOSIS — M170 Bilateral primary osteoarthritis of knee: Secondary | ICD-10-CM | POA: Diagnosis not present

## 2016-02-17 DIAGNOSIS — Z853 Personal history of malignant neoplasm of breast: Secondary | ICD-10-CM | POA: Diagnosis not present

## 2016-02-17 DIAGNOSIS — Z1231 Encounter for screening mammogram for malignant neoplasm of breast: Secondary | ICD-10-CM | POA: Diagnosis not present

## 2016-03-18 DIAGNOSIS — M170 Bilateral primary osteoarthritis of knee: Secondary | ICD-10-CM | POA: Diagnosis not present

## 2016-03-23 ENCOUNTER — Other Ambulatory Visit: Payer: Self-pay | Admitting: Orthopaedic Surgery

## 2016-04-21 DIAGNOSIS — Z6834 Body mass index (BMI) 34.0-34.9, adult: Secondary | ICD-10-CM | POA: Diagnosis not present

## 2016-04-21 DIAGNOSIS — I480 Paroxysmal atrial fibrillation: Secondary | ICD-10-CM | POA: Diagnosis not present

## 2016-04-21 NOTE — Pre-Procedure Instructions (Signed)
Robin Christensen  04/21/2016      [place], [place] [id number] [place] [place] [address] Phone: [phone] Fax: [phone]    Your procedure is scheduled on Tues, Oct 17 @ 7:30 AM  Report to [facility] Admitting at 5:30 AM  Call this number if you have problems the morning of surgery:  [phone]   Remember:  Do not eat food or drink liquids after midnight.  Take these medicines the morning of surgery with A SIP OF WATER Pepcid(Famotidine),Synthroid(Levothyroxine), and Metoprolol(Lopressor)             No Goody's,BC's,Aleve,Advil,Motrin,Ibuprofen,Aspirin,Fish Oil,or any Herbal Medications.    Do not wear jewelry, make-up or nail polish.  Do not wear lotions, powders, perfumes, or deoderant.  Do not shave 48 hours prior to surgery.    Do not bring valuables to the hospital.  [facility] is not responsible for any belongings or valuables.  Contacts, dentures or bridgework may not be worn into surgery.  Leave your suitcase in the car.  After surgery it may be brought to your room.  For patients admitted to the hospital, discharge time will be determined by your treatment team.  Patients discharged the day of surgery will not be allowed to drive home.    [place] - Preparing for Surgery  Before surgery, you can play an important role.  Because skin is not sterile, your skin needs to be as free of germs as possible.  You can reduce the number of germs on you skin by washing with CHG (chlorahexidine gluconate) soap before surgery.  CHG is an antiseptic cleaner which kills germs and bonds with the skin to continue killing germs even after washing.  Please DO NOT use if you have an allergy to CHG or antibacterial soaps.  If your skin becomes reddened/irritated stop using the CHG and inform your nurse when you arrive at Short Stay.  Do not shave (including legs and underarms) for at least 48 hours prior  to the first CHG shower.  You may shave your face.  Please follow these instructions carefully:   1.  Shower with CHG Soap the night before surgery and the                                morning of Surgery.  2.  If you choose to wash your hair, wash your hair first as usual with your       normal shampoo.  3.  After you shampoo, rinse your hair and body thoroughly to remove the                      Shampoo.  4.  Use CHG as you would any other liquid soap.  You can apply chg directly       to the skin and wash gently with scrungie or a clean washcloth.  5.  Apply the CHG Soap to your body ONLY FROM THE NECK DOWN.        Do not use on open wounds or open sores.  Avoid contact with your eyes,       ears, mouth and genitals (private parts).  Wash genitals (private parts)       with your normal soap.  6.  Wash thoroughly, paying special attention to the area where your surgery  will be performed.  7.  Thoroughly rinse your body with warm water from the neck down.  8.  DO NOT shower/wash with your normal soap after using and rinsing off       the CHG Soap.  9.  Pat yourself dry with a clean towel.            10.  Wear clean pajamas.            11.  Place clean sheets on your bed the night of your first shower and do not        sleep with pets.  Day of Surgery  Do not apply any lotions/deoderants the morning of surgery.  Please wear clean clothes to the hospital/surgery center.    Please read over the following fact sheets that you were given. Pain Booklet, Coughing and Deep Breathing, MRSA Information and Surgical Site Infection Prevention

## 2016-04-22 ENCOUNTER — Encounter (HOSPITAL_COMMUNITY): Payer: Self-pay

## 2016-04-22 ENCOUNTER — Ambulatory Visit (HOSPITAL_COMMUNITY)
Admission: RE | Admit: 2016-04-22 | Discharge: 2016-04-22 | Disposition: A | Discharge status: Home / Self Care | Payer: Commercial Managed Care - HMO | Source: Ambulatory Visit | Attending: Orthopaedic Surgery | Admitting: Orthopaedic Surgery

## 2016-04-22 ENCOUNTER — Encounter (HOSPITAL_COMMUNITY)
Admission: RE | Admit: 2016-04-22 | Discharge: 2016-04-22 | Disposition: A | Discharge status: Home / Self Care | Payer: Commercial Managed Care - HMO | Source: Ambulatory Visit | Attending: Orthopaedic Surgery | Admitting: Orthopaedic Surgery

## 2016-04-22 DIAGNOSIS — Z01812 Encounter for preprocedural laboratory examination: Secondary | ICD-10-CM | POA: Insufficient documentation

## 2016-04-22 DIAGNOSIS — Z01818 Encounter for other preprocedural examination: Secondary | ICD-10-CM

## 2016-04-22 DIAGNOSIS — Z23 Encounter for immunization: Secondary | ICD-10-CM | POA: Diagnosis not present

## 2016-04-22 DIAGNOSIS — M1711 Unilateral primary osteoarthritis, right knee: Secondary | ICD-10-CM | POA: Insufficient documentation

## 2016-04-22 HISTORY — DX: Sleep apnea, unspecified: G47.30

## 2016-04-22 HISTORY — DX: Cardiac arrhythmia, unspecified: I49.9

## 2016-04-22 LAB — BASIC METABOLIC PANEL
Anion gap: 7 (ref 5–15)
BUN: 13 mg/dL (ref 6–20)
CO2: 29 mmol/L (ref 22–32)
Calcium: 9.3 mg/dL (ref 8.9–10.3)
Chloride: 105 mmol/L (ref 101–111)
Creatinine, Ser: 0.72 mg/dL (ref 0.44–1.00)
GFR calc Af Amer: >60 mL/min (ref >60)
GLUCOSE: 93 mg/dL (ref 65–99)
POTASSIUM: 3.8 mmol/L (ref 3.5–5.1)
Sodium: 141 mmol/L (ref 135–145)

## 2016-04-22 LAB — CBC WITH DIFFERENTIAL/PLATELET
BASOS ABS: 0 10*3/uL (ref 0.0–0.1)
Basophils Relative: 0 %
EOS PCT: 3 %
Eosinophils Absolute: 0.2 10*3/uL (ref 0.0–0.7)
HCT: 43.9 % (ref 36.0–46.0)
Hemoglobin: 14.2 g/dL (ref 12.0–15.0)
LYMPHS PCT: 35 %
Lymphs Abs: 2.3 10*3/uL (ref 0.7–4.0)
MCH: 29.5 pg (ref 26.0–34.0)
MCHC: 32.3 g/dL (ref 30.0–36.0)
MCV: 91.1 fL (ref 78.0–100.0)
MONO ABS: 0.4 10*3/uL (ref 0.1–1.0)
MONOS PCT: 6 %
Neutro Abs: 3.7 10*3/uL (ref 1.7–7.7)
Neutrophils Relative %: 56 %
PLATELETS: 195 10*3/uL (ref 150–400)
RBC: 4.82 MIL/uL (ref 3.87–5.11)
RDW: 13.8 % (ref 11.5–15.5)
WBC: 6.5 10*3/uL (ref 4.0–10.5)

## 2016-04-22 LAB — SURGICAL PCR SCREEN
MRSA, PCR: NEGATIVE
STAPHYLOCOCCUS AUREUS: POSITIVE — AB

## 2016-04-22 LAB — URINALYSIS, ROUTINE W REFLEX MICROSCOPIC
Bilirubin Urine: NEGATIVE
Glucose, UA: NEGATIVE mg/dL
Hgb urine dipstick: NEGATIVE
Ketones, ur: NEGATIVE mg/dL
NITRITE: NEGATIVE
PROTEIN: NEGATIVE mg/dL
SPECIFIC GRAVITY, URINE: 1.007 (ref 1.005–1.030)
pH: 6.5 (ref 5.0–8.0)

## 2016-04-22 LAB — APTT: APTT: 51 s — AB (ref 24–36)

## 2016-04-22 LAB — URINE MICROSCOPIC-ADD ON
BACTERIA UA: NONE SEEN
RBC / HPF: NONE SEEN RBC/hpf (ref 0–5)

## 2016-04-22 LAB — PROTIME-INR
INR: 1.23
Prothrombin Time: 15.6 seconds — ABNORMAL HIGH (ref 11.4–15.2)

## 2016-04-22 LAB — TYPE AND SCREEN
ABO/RH(D): O POS
Antibody Screen: NEGATIVE

## 2016-04-22 LAB — ABO/RH: ABO/RH(D): O POS

## 2016-04-25 NOTE — Progress Notes (Addendum)
Anesthesia Chart Review:  Pt is a 65 year old female scheduled R total knee arthroplasty on 05/03/2016 with Melrose Nakayama, MD.   - Cardiologist is Kela Millin, MD, last office visit 04/21/16. Dr. Einar Gip has cleared pt for surgery at low risk.  - PCP is Elon Alas, MD.   PMH includes:  Atrial fibrillation, OSA, HTN, hyperlipidemia, post-op N/V. Never smoker. BMI 33.5  Medications include: eliquis, pepcid, lasix, levothyroxine, metoprolol, pravastatin. Dr. Einar Gip has given ok to stop eliquis 3 days before surgery. Pt's last dose will be 04/30/16 in the morning. She will hold the evening dose and resume eliquis post-op. I notified Juliann Pulse in Dr. Jerald Kief office about this.   Preoperative labs reviewed.  PT 15.6, PTT 51. Will recheck DOS.   EKG 04/21/16: sinus bradycardia (57 bpm).   From Dr. Irven Shelling notes, pt had an echo 06/02/11 that showed normal LVEF, Mild LVH, no significant valvular abnormalities. Nuclear stress test 06/26/14 that showed normal perfusion, normal LVEF, no ischemia.   If PT/PTT acceptable DOS, I anticipate pt can proceed as scheduled.   Willeen Cass, FNP-BC [facility] Short Stay Surgical Center/Anesthesiology Phone: [phone] 04/27/2016 2:09 PM

## 2016-05-02 NOTE — H&P (Signed)
TOTAL KNEE ADMISSION H&P  Patient is being admitted for right total knee arthroplasty.  Subjective:  Chief Complaint:right knee pain.  HPI: Robin Christensen, 65 y.o. female, has a history of pain and functional disability in the right knee due to arthritis and has failed non-surgical conservative treatments for greater than 12 weeks to includeNSAID's and/or analgesics, corticosteriod injections, viscosupplementation injections, flexibility and strengthening excercises, use of assistive devices, weight reduction as appropriate and activity modification.  Onset of symptoms was gradual, starting 5 years ago with gradually worsening course since that time. The patient noted no past surgery on the right knee(s).  Patient currently rates pain in the right knee(s) at 10 out of 10 with activity. Patient has night pain, worsening of pain with activity and weight bearing, pain that interferes with activities of daily living, crepitus and joint swelling.  Patient has evidence of subchondral cysts, subchondral sclerosis, periarticular osteophytes and joint space narrowing by imaging studies. There is no active infection.  There are no active problems to display for this patient.  Past Medical History:  Diagnosis Date  . Cancer ([place])   . Dysrhythmia    AF  . Hyperlipidemia   . Hypertension   . Obesity   . PONV (postoperative nausea and vomiting)   . Sleep apnea    cpcap    Past Surgical History:  Procedure Laterality Date  . BREAST SURGERY    . CARDIOVERSION N/A 06/26/2014   Procedure: CARDIOVERSION;  Surgeon: Laverda Page, MD;  Location: [facility];  Service: Cardiovascular;  Laterality: N/A;  . CHOLECYSTECTOMY      No prescriptions prior to admission.   Allergies  Allergen Reactions  . Xarelto [Rivaroxaban] Other (See Comments)    JOINT ACHES/PAIN    Social History  Substance Use Topics  . Smoking status: Never Smoker  . Smokeless tobacco: Never Used  . Alcohol use Yes   Comment: once a year, maybe    No family history on file.   Review of Systems  Musculoskeletal: Positive for joint pain.       Right knee  All other systems reviewed and are negative.   Objective:  Physical Exam  Constitutional: She is oriented to person, place, and time. She appears well-developed and well-nourished.  HENT:  Head: Normocephalic and atraumatic.  Eyes: Pupils are equal, round, and reactive to light.  Neck: Normal range of motion.  Cardiovascular: Normal rate.   Respiratory: Effort normal.  GI: Soft.  Musculoskeletal:  Right knee motion is good from 0-120. There is no effusion. She has medial joint line pain and some crepitation. Hip motion is full and straight leg raise is negative. Sensation and motor function are intact in her feet with palpable pulses on both sides. She has normal unlabored respirations. There is no palpable lymphadenopathy behind either knee.   Neurological: She is alert and oriented to person, place, and time.  Skin: Skin is warm and dry.  Psychiatric: She has a normal mood and affect. Her behavior is normal. Judgment and thought content normal.    Vital signs in last 24 hours:    Labs:   Estimated body mass index is 33.6 kg/m as calculated from the following:   Height as of 04/22/16: 5\' 8"  (1.727 m).   Weight as of 04/22/16: 100.2 kg (221 lb).   Imaging Review Plain radiographs demonstrate severe degenerative joint disease of the right knee(s). The overall alignment isneutral. The bone quality appears to be good for age and reported activity level.  Assessment/Plan:  End stage primary arthritis, right knee   The patient history, physical examination, clinical judgment of the provider and imaging studies are consistent with end stage degenerative joint disease of the right knee(s) and total knee arthroplasty is deemed medically necessary. The treatment options including medical management, injection therapy arthroscopy and  arthroplasty were discussed at length. The risks and benefits of total knee arthroplasty were presented and reviewed. The risks due to aseptic loosening, infection, stiffness, patella tracking problems, thromboembolic complications and other imponderables were discussed. The patient acknowledged the explanation, agreed to proceed with the plan and consent was signed. Patient is being admitted for inpatient treatment for surgery, pain control, PT, OT, prophylactic antibiotics, VTE prophylaxis, progressive ambulation and ADL's and discharge planning. The patient is planning to be discharged to skilled nursing facility

## 2016-05-03 ENCOUNTER — Inpatient Hospital Stay (HOSPITAL_COMMUNITY)
Admission: RE | Admit: 2016-05-03 | Discharge: 2016-05-06 | DRG: 470 | Disposition: A | Discharge status: Skilled Nursing Facility | Payer: Commercial Managed Care - HMO | Source: Ambulatory Visit | Attending: Orthopaedic Surgery | Admitting: Orthopaedic Surgery

## 2016-05-03 ENCOUNTER — Encounter (HOSPITAL_COMMUNITY)
Admission: RE | Disposition: A | Discharge status: Skilled Nursing Facility | Payer: Self-pay | Source: Ambulatory Visit | Attending: Orthopaedic Surgery

## 2016-05-03 ENCOUNTER — Inpatient Hospital Stay (HOSPITAL_COMMUNITY): Payer: Commercial Managed Care - HMO | Admitting: Emergency Medicine

## 2016-05-03 ENCOUNTER — Encounter (HOSPITAL_COMMUNITY): Payer: Self-pay | Admitting: Certified Registered Nurse Anesthetist

## 2016-05-03 DIAGNOSIS — Z6833 Body mass index (BMI) 33.0-33.9, adult: Secondary | ICD-10-CM

## 2016-05-03 DIAGNOSIS — M6281 Muscle weakness (generalized): Secondary | ICD-10-CM | POA: Diagnosis not present

## 2016-05-03 DIAGNOSIS — I10 Essential (primary) hypertension: Secondary | ICD-10-CM | POA: Diagnosis not present

## 2016-05-03 DIAGNOSIS — E785 Hyperlipidemia, unspecified: Secondary | ICD-10-CM | POA: Diagnosis present

## 2016-05-03 DIAGNOSIS — E669 Obesity, unspecified: Secondary | ICD-10-CM | POA: Diagnosis not present

## 2016-05-03 DIAGNOSIS — I480 Paroxysmal atrial fibrillation: Secondary | ICD-10-CM | POA: Diagnosis not present

## 2016-05-03 DIAGNOSIS — E039 Hypothyroidism, unspecified: Secondary | ICD-10-CM | POA: Diagnosis not present

## 2016-05-03 DIAGNOSIS — Z888 Allergy status to other drugs, medicaments and biological substances status: Secondary | ICD-10-CM

## 2016-05-03 DIAGNOSIS — Z5181 Encounter for therapeutic drug level monitoring: Secondary | ICD-10-CM | POA: Diagnosis not present

## 2016-05-03 DIAGNOSIS — G4730 Sleep apnea, unspecified: Secondary | ICD-10-CM | POA: Diagnosis present

## 2016-05-03 DIAGNOSIS — M25561 Pain in right knee: Secondary | ICD-10-CM | POA: Diagnosis present

## 2016-05-03 DIAGNOSIS — M25569 Pain in unspecified knee: Secondary | ICD-10-CM | POA: Diagnosis not present

## 2016-05-03 DIAGNOSIS — M1711 Unilateral primary osteoarthritis, right knee: Secondary | ICD-10-CM | POA: Diagnosis not present

## 2016-05-03 DIAGNOSIS — M179 Osteoarthritis of knee, unspecified: Secondary | ICD-10-CM | POA: Diagnosis not present

## 2016-05-03 HISTORY — PX: TOTAL KNEE ARTHROPLASTY: SHX125

## 2016-05-03 LAB — PROTIME-INR
INR: 1
PROTHROMBIN TIME: 13.2 s (ref 11.4–15.2)

## 2016-05-03 LAB — APTT: APTT: 38 s — AB (ref 24–36)

## 2016-05-03 SURGERY — ARTHROPLASTY, KNEE, TOTAL
Anesthesia: Spinal | Laterality: Right

## 2016-05-03 MED ORDER — ONDANSETRON HCL 4 MG/2ML IJ SOLN
4.0000 mg | Freq: Four times a day (QID) | INTRAMUSCULAR | Status: DC | PRN
  Administered 2016-05-03 (×2): 4 mg via INTRAVENOUS
  Filled 2016-05-03 (×2): qty 2

## 2016-05-03 MED ORDER — PROPOFOL 500 MG/50ML IV EMUL
INTRAVENOUS | Status: DC | PRN
  Administered 2016-05-03: 200 ug/kg/min via INTRAVENOUS

## 2016-05-03 MED ORDER — EPHEDRINE SULFATE-NACL 50-0.9 MG/10ML-% IV SOSY
PREFILLED_SYRINGE | INTRAVENOUS | Status: DC | PRN
  Administered 2016-05-03: 7.5 mg via INTRAVENOUS

## 2016-05-03 MED ORDER — FENTANYL CITRATE (PF) 100 MCG/2ML IJ SOLN
INTRAMUSCULAR | Status: AC
  Filled 2016-05-03: qty 2

## 2016-05-03 MED ORDER — FENTANYL CITRATE (PF) 100 MCG/2ML IJ SOLN
INTRAMUSCULAR | Status: AC
  Filled 2016-05-03: qty 4

## 2016-05-03 MED ORDER — 0.9 % SODIUM CHLORIDE (POUR BTL) OPTIME
TOPICAL | Status: DC | PRN
  Administered 2016-05-03: 1000 mL

## 2016-05-03 MED ORDER — MIDAZOLAM HCL 2 MG/2ML IJ SOLN
INTRAMUSCULAR | Status: AC
  Filled 2016-05-03: qty 2

## 2016-05-03 MED ORDER — MENTHOL 3 MG MT LOZG: 1.0000 | LOZENGE | OROMUCOSAL | Status: DC | PRN

## 2016-05-03 MED ORDER — LEVOTHYROXINE SODIUM 75 MCG PO TABS
75.0000 ug | ORAL_TABLET | Freq: Every day | ORAL | Status: DC
  Administered 2016-05-04 – 2016-05-06 (×3): 75 ug via ORAL
  Filled 2016-05-03 (×3): qty 1

## 2016-05-03 MED ORDER — CEFAZOLIN SODIUM-DEXTROSE 2-4 GM/100ML-% IV SOLN
2.0000 g | Freq: Four times a day (QID) | INTRAVENOUS | Status: AC
  Administered 2016-05-03 – 2016-05-04 (×2): 2 g via INTRAVENOUS
  Filled 2016-05-03 (×2): qty 100

## 2016-05-03 MED ORDER — BUPIVACAINE-EPINEPHRINE (PF) 0.25% -1:200000 IJ SOLN
INTRAMUSCULAR | Status: AC
  Filled 2016-05-03: qty 30

## 2016-05-03 MED ORDER — PROMETHAZINE HCL 25 MG/ML IJ SOLN
INTRAMUSCULAR | Status: AC
  Filled 2016-05-03: qty 1

## 2016-05-03 MED ORDER — FENTANYL CITRATE (PF) 100 MCG/2ML IJ SOLN
INTRAMUSCULAR | Status: DC | PRN
  Administered 2016-05-03 (×2): 50 ug via INTRAVENOUS
  Administered 2016-05-03: 100 ug via INTRAVENOUS
  Administered 2016-05-03 (×2): 50 ug via INTRAVENOUS

## 2016-05-03 MED ORDER — SODIUM CHLORIDE 0.9 % IV SOLN
1000.0000 mg | INTRAVENOUS | Status: AC
  Administered 2016-05-03: 1000 mg via INTRAVENOUS
  Filled 2016-05-03: qty 10

## 2016-05-03 MED ORDER — BUPIVACAINE HCL (PF) 0.75 % IJ SOLN
INTRAMUSCULAR | Status: DC | PRN
  Administered 2016-05-03: 2 mL via INTRATHECAL

## 2016-05-03 MED ORDER — PHENOL 1.4 % MT LIQD: 1.0000 | OROMUCOSAL | Status: DC | PRN

## 2016-05-03 MED ORDER — BUPIVACAINE-EPINEPHRINE (PF) 0.25% -1:200000 IJ SOLN
INTRAMUSCULAR | Status: DC | PRN
  Administered 2016-05-03: 20 mL

## 2016-05-03 MED ORDER — CHLORHEXIDINE GLUCONATE 4 % EX LIQD: 60.0000 mL | Freq: Once | CUTANEOUS | Status: DC

## 2016-05-03 MED ORDER — PROMETHAZINE HCL 25 MG/ML IJ SOLN
INTRAMUSCULAR | Status: AC
  Administered 2016-05-03: 6.25 mg via INTRAVENOUS
  Filled 2016-05-03: qty 1

## 2016-05-03 MED ORDER — ONDANSETRON HCL 4 MG/2ML IJ SOLN
INTRAMUSCULAR | Status: AC
  Administered 2016-05-03: 4 mg via INTRAVENOUS
  Filled 2016-05-03: qty 2

## 2016-05-03 MED ORDER — TRANEXAMIC ACID 1000 MG/10ML IV SOLN
2000.0000 mg | INTRAVENOUS | Status: AC
  Administered 2016-05-03: 2000 mg via TOPICAL
  Filled 2016-05-03: qty 20

## 2016-05-03 MED ORDER — EPHEDRINE 5 MG/ML INJ
INTRAVENOUS | Status: AC
  Filled 2016-05-03: qty 10

## 2016-05-03 MED ORDER — BUPIVACAINE LIPOSOME 1.3 % IJ SUSP: INTRAMUSCULAR | Status: DC | PRN

## 2016-05-03 MED ORDER — METOPROLOL SUCCINATE ER 25 MG PO TB24
25.0000 mg | ORAL_TABLET | Freq: Every day | ORAL | Status: DC
  Administered 2016-05-03 – 2016-05-04 (×2): 25 mg via ORAL
  Filled 2016-05-03 (×3): qty 1

## 2016-05-03 MED ORDER — METHOCARBAMOL 500 MG PO TABS
ORAL_TABLET | ORAL | Status: AC
  Administered 2016-05-03: 500 mg via ORAL
  Filled 2016-05-03: qty 1

## 2016-05-03 MED ORDER — HYDROCODONE-ACETAMINOPHEN 5-325 MG PO TABS
ORAL_TABLET | ORAL | Status: AC
  Administered 2016-05-03: 2 via ORAL
  Filled 2016-05-03: qty 2

## 2016-05-03 MED ORDER — ARTIFICIAL TEARS OP OINT
TOPICAL_OINTMENT | OPHTHALMIC | Status: AC
  Filled 2016-05-03: qty 3.5

## 2016-05-03 MED ORDER — HYDROMORPHONE HCL 1 MG/ML IJ SOLN
0.2500 mg | INTRAMUSCULAR | Status: DC | PRN
  Administered 2016-05-03 (×2): 0.5 mg via INTRAVENOUS
  Administered 2016-05-03 (×2): 0.25 mg via INTRAVENOUS

## 2016-05-03 MED ORDER — ONDANSETRON HCL 4 MG PO TABS
4.0000 mg | ORAL_TABLET | Freq: Four times a day (QID) | ORAL | Status: DC | PRN
  Administered 2016-05-04: 4 mg via ORAL
  Filled 2016-05-03 (×2): qty 1

## 2016-05-03 MED ORDER — METHOCARBAMOL 1000 MG/10ML IJ SOLN
500.0000 mg | Freq: Four times a day (QID) | INTRAVENOUS | Status: DC | PRN
  Filled 2016-05-03: qty 5

## 2016-05-03 MED ORDER — DOCUSATE SODIUM 100 MG PO CAPS
100.0000 mg | ORAL_CAPSULE | Freq: Two times a day (BID) | ORAL | Status: DC
  Administered 2016-05-03 – 2016-05-06 (×6): 100 mg via ORAL
  Filled 2016-05-03 (×6): qty 1

## 2016-05-03 MED ORDER — HYDROCODONE-ACETAMINOPHEN 5-325 MG PO TABS
1.0000 | ORAL_TABLET | ORAL | Status: DC | PRN
  Administered 2016-05-03 – 2016-05-04 (×4): 2 via ORAL
  Administered 2016-05-04: 1 via ORAL
  Administered 2016-05-05 – 2016-05-06 (×7): 2 via ORAL
  Filled 2016-05-03 (×12): qty 2

## 2016-05-03 MED ORDER — METOCLOPRAMIDE HCL 5 MG PO TABS: 5.0000 mg | ORAL_TABLET | Freq: Three times a day (TID) | ORAL | Status: DC | PRN

## 2016-05-03 MED ORDER — PROPOFOL 10 MG/ML IV BOLUS
INTRAVENOUS | Status: AC
  Filled 2016-05-03: qty 20

## 2016-05-03 MED ORDER — CEFAZOLIN SODIUM-DEXTROSE 2-4 GM/100ML-% IV SOLN
2.0000 g | INTRAVENOUS | Status: AC
  Administered 2016-05-03: 2 g via INTRAVENOUS
  Filled 2016-05-03: qty 100

## 2016-05-03 MED ORDER — ROCURONIUM BROMIDE 10 MG/ML (PF) SYRINGE
PREFILLED_SYRINGE | INTRAVENOUS | Status: AC
  Filled 2016-05-03: qty 10

## 2016-05-03 MED ORDER — HYDROMORPHONE HCL 2 MG/ML IJ SOLN
0.5000 mg | INTRAMUSCULAR | Status: DC | PRN
  Administered 2016-05-03 – 2016-05-05 (×5): 1 mg via INTRAVENOUS
  Filled 2016-05-03 (×5): qty 1

## 2016-05-03 MED ORDER — HYDROMORPHONE HCL 1 MG/ML IJ SOLN
INTRAMUSCULAR | Status: AC
  Filled 2016-05-03: qty 1

## 2016-05-03 MED ORDER — FAMOTIDINE 20 MG PO TABS: 10.0000 mg | ORAL_TABLET | Freq: Every day | ORAL | Status: DC | PRN

## 2016-05-03 MED ORDER — ONDANSETRON HCL 4 MG/2ML IJ SOLN
INTRAMUSCULAR | Status: AC
  Filled 2016-05-03: qty 2

## 2016-05-03 MED ORDER — HYDROMORPHONE HCL 1 MG/ML IJ SOLN: 0.5000 mg | INTRAMUSCULAR | Status: DC | PRN

## 2016-05-03 MED ORDER — ACETAMINOPHEN 650 MG RE SUPP: 650.0000 mg | Freq: Four times a day (QID) | RECTAL | Status: DC | PRN

## 2016-05-03 MED ORDER — PRAVASTATIN SODIUM 40 MG PO TABS
40.0000 mg | ORAL_TABLET | Freq: Every day | ORAL | Status: DC
Start: 2016-05-03 — End: 2016-05-06
  Administered 2016-05-04 – 2016-05-06 (×3): 40 mg via ORAL
  Filled 2016-05-03 (×4): qty 1

## 2016-05-03 MED ORDER — HYDROCODONE-ACETAMINOPHEN 5-325 MG PO TABS: 1.0000 | ORAL_TABLET | Freq: Once | ORAL | Status: DC | PRN

## 2016-05-03 MED ORDER — FUROSEMIDE 40 MG PO TABS
40.0000 mg | ORAL_TABLET | Freq: Every day | ORAL | Status: DC
  Administered 2016-05-04 – 2016-05-06 (×3): 40 mg via ORAL
  Filled 2016-05-03 (×3): qty 1

## 2016-05-03 MED ORDER — LIDOCAINE 2% (20 MG/ML) 5 ML SYRINGE
INTRAMUSCULAR | Status: AC
  Filled 2016-05-03: qty 5

## 2016-05-03 MED ORDER — ACETAMINOPHEN 325 MG PO TABS: 650.0000 mg | ORAL_TABLET | Freq: Four times a day (QID) | ORAL | Status: DC | PRN

## 2016-05-03 MED ORDER — METHOCARBAMOL 500 MG PO TABS
500.0000 mg | ORAL_TABLET | Freq: Four times a day (QID) | ORAL | Status: DC | PRN
  Administered 2016-05-03 – 2016-05-06 (×9): 500 mg via ORAL
  Filled 2016-05-03 (×12): qty 1

## 2016-05-03 MED ORDER — PROMETHAZINE HCL 25 MG/ML IJ SOLN
6.2500 mg | INTRAMUSCULAR | Status: AC | PRN
  Administered 2016-05-03 (×2): 6.25 mg via INTRAVENOUS

## 2016-05-03 MED ORDER — LACTATED RINGERS IV SOLN
INTRAVENOUS | Status: DC
  Administered 2016-05-03: 18:00:00 via INTRAVENOUS

## 2016-05-03 MED ORDER — BISACODYL 5 MG PO TBEC: 5.0000 mg | DELAYED_RELEASE_TABLET | Freq: Every day | ORAL | Status: DC | PRN

## 2016-05-03 MED ORDER — SODIUM CHLORIDE 0.9 % IJ SOLN
INTRAMUSCULAR | Status: DC | PRN
  Administered 2016-05-03: 40 mL via INTRAVENOUS

## 2016-05-03 MED ORDER — MIDAZOLAM HCL 5 MG/5ML IJ SOLN
INTRAMUSCULAR | Status: DC | PRN
  Administered 2016-05-03 (×2): 1 mg via INTRAVENOUS

## 2016-05-03 MED ORDER — DIPHENHYDRAMINE HCL 12.5 MG/5ML PO ELIX: 12.5000 mg | ORAL_SOLUTION | ORAL | Status: DC | PRN

## 2016-05-03 MED ORDER — SODIUM CHLORIDE 0.9 % IR SOLN
Status: DC | PRN
  Administered 2016-05-03: 3000 mL

## 2016-05-03 MED ORDER — LACTATED RINGERS IV SOLN
INTRAVENOUS | Status: DC
  Administered 2016-05-03 (×2): via INTRAVENOUS

## 2016-05-03 MED ORDER — METOCLOPRAMIDE HCL 5 MG/ML IJ SOLN
5.0000 mg | Freq: Three times a day (TID) | INTRAMUSCULAR | Status: DC | PRN
  Administered 2016-05-03 – 2016-05-04 (×2): 10 mg via INTRAVENOUS
  Filled 2016-05-03 (×2): qty 2

## 2016-05-03 MED ORDER — APIXABAN 5 MG PO TABS
5.0000 mg | ORAL_TABLET | Freq: Two times a day (BID) | ORAL | Status: DC
  Administered 2016-05-04 – 2016-05-06 (×5): 5 mg via ORAL
  Filled 2016-05-03 (×5): qty 1

## 2016-05-03 MED ORDER — BUPIVACAINE LIPOSOME 1.3 % IJ SUSP
20.0000 mL | INTRAMUSCULAR | Status: AC
  Administered 2016-05-03: 20 mL
  Filled 2016-05-03: qty 20

## 2016-05-03 MED ORDER — TRANEXAMIC ACID 1000 MG/10ML IV SOLN
1000.0000 mg | Freq: Once | INTRAVENOUS | Status: AC
  Administered 2016-05-03: 1000 mg via INTRAVENOUS
  Filled 2016-05-03: qty 10

## 2016-05-03 MED ORDER — ALUM & MAG HYDROXIDE-SIMETH 200-200-20 MG/5ML PO SUSP: 30.0000 mL | ORAL | Status: DC | PRN

## 2016-05-03 SURGICAL SUPPLY — 69 items
BAG DECANTER FOR FLEXI CONT (MISCELLANEOUS) IMPLANT
BANDAGE ACE 4X5 VEL STRL LF (GAUZE/BANDAGES/DRESSINGS) ×3 IMPLANT
BANDAGE ACE 6X5 VEL STRL LF (GAUZE/BANDAGES/DRESSINGS) ×3 IMPLANT
BANDAGE ESMARK 6X9 LF (GAUZE/BANDAGES/DRESSINGS) ×1 IMPLANT
BLADE SAGITTAL 25.0X1.19X90 (BLADE) IMPLANT
BLADE SAGITTAL 25.0X1.19X90MM (BLADE)
BLADE SAW SGTL 13.0X1.19X90.0M (BLADE) IMPLANT
BLADE SURG ROTATE 9660 (MISCELLANEOUS) IMPLANT
BNDG ELASTIC 6X10 VLCR STRL LF (GAUZE/BANDAGES/DRESSINGS) ×3 IMPLANT
BNDG ESMARK 6X9 LF (GAUZE/BANDAGES/DRESSINGS) ×3
BNDG GAUZE ELAST 4 BULKY (GAUZE/BANDAGES/DRESSINGS) ×6 IMPLANT
BOWL SMART MIX CTS (DISPOSABLE) ×3 IMPLANT
CAP KNEE TOTAL 3 SIGMA ×3 IMPLANT
CEMENT HV SMART SET (Cement) ×6 IMPLANT
CLOSURE WOUND 1/2 X4 (GAUZE/BANDAGES/DRESSINGS) ×1
COVER SURGICAL LIGHT HANDLE (MISCELLANEOUS) ×3 IMPLANT
CUFF TOURNIQUET SINGLE 34IN LL (TOURNIQUET CUFF) ×3 IMPLANT
CUFF TOURNIQUET SINGLE 44IN (TOURNIQUET CUFF) IMPLANT
DECANTER SPIKE VIAL GLASS SM (MISCELLANEOUS) ×3 IMPLANT
DRAPE EXTREMITY T 121X128X90 (DRAPE) ×3 IMPLANT
DRAPE HALF SHEET 40X57 (DRAPES) ×3 IMPLANT
DRAPE PROXIMA HALF (DRAPES) ×3 IMPLANT
DRAPE U-SHAPE 47X51 STRL (DRAPES) ×3 IMPLANT
DRSG ADAPTIC 3X8 NADH LF (GAUZE/BANDAGES/DRESSINGS) ×3 IMPLANT
DRSG PAD ABDOMINAL 8X10 ST (GAUZE/BANDAGES/DRESSINGS) ×3 IMPLANT
DURAPREP 26ML APPLICATOR (WOUND CARE) ×3 IMPLANT
ELECT REM PT RETURN 9FT ADLT (ELECTROSURGICAL) ×3
ELECTRODE REM PT RTRN 9FT ADLT (ELECTROSURGICAL) ×1 IMPLANT
GAUZE SPONGE 4X4 12PLY STRL (GAUZE/BANDAGES/DRESSINGS) ×3 IMPLANT
GLOVE BIO SURGEON STRL SZ8 (GLOVE) ×6 IMPLANT
GLOVE BIOGEL PI IND STRL 8 (GLOVE) ×2 IMPLANT
GLOVE BIOGEL PI INDICATOR 8 (GLOVE) ×4
GOWN STRL REUS W/ TWL LRG LVL3 (GOWN DISPOSABLE) ×1 IMPLANT
GOWN STRL REUS W/ TWL XL LVL3 (GOWN DISPOSABLE) ×2 IMPLANT
GOWN STRL REUS W/TWL LRG LVL3 (GOWN DISPOSABLE) ×2
GOWN STRL REUS W/TWL XL LVL3 (GOWN DISPOSABLE) ×4
HANDPIECE INTERPULSE COAX TIP (DISPOSABLE) ×2
HOOD PEEL AWAY FACE SHEILD DIS (HOOD) ×6 IMPLANT
IMMOBILIZER KNEE 22 UNIV (SOFTGOODS) ×3 IMPLANT
KIT BASIN OR (CUSTOM PROCEDURE TRAY) ×3 IMPLANT
KIT ROOM TURNOVER OR (KITS) ×3 IMPLANT
MANIFOLD NEPTUNE II (INSTRUMENTS) ×3 IMPLANT
NEEDLE HYPO 21X1 ECLIPSE (NEEDLE) ×3 IMPLANT
NS IRRIG 1000ML POUR BTL (IV SOLUTION) ×3 IMPLANT
PACK TOTAL JOINT (CUSTOM PROCEDURE TRAY) ×3 IMPLANT
PACK UNIVERSAL I (CUSTOM PROCEDURE TRAY) ×3 IMPLANT
PAD ARMBOARD 7.5X6 YLW CONV (MISCELLANEOUS) ×6 IMPLANT
PAD CAST 4YDX4 CTTN HI CHSV (CAST SUPPLIES) ×1 IMPLANT
PADDING CAST COTTON 4X4 STRL (CAST SUPPLIES) ×2
PADDING CAST COTTON 6X4 STRL (CAST SUPPLIES) ×3 IMPLANT
SET HNDPC FAN SPRY TIP SCT (DISPOSABLE) ×1 IMPLANT
SPONGE GAUZE 4X4 12PLY STER LF (GAUZE/BANDAGES/DRESSINGS) ×3 IMPLANT
STAPLER VISISTAT 35W (STAPLE) IMPLANT
STRIP CLOSURE SKIN 1/2X4 (GAUZE/BANDAGES/DRESSINGS) ×2 IMPLANT
SUCTION FRAZIER HANDLE 10FR (MISCELLANEOUS)
SUCTION TUBE FRAZIER 10FR DISP (MISCELLANEOUS) IMPLANT
SUT MNCRL AB 3-0 PS2 18 (SUTURE) IMPLANT
SUT VIC AB 0 CT1 27 (SUTURE) ×4
SUT VIC AB 0 CT1 27XBRD ANBCTR (SUTURE) ×2 IMPLANT
SUT VIC AB 2-0 CT1 27 (SUTURE) ×4
SUT VIC AB 2-0 CT1 TAPERPNT 27 (SUTURE) ×2 IMPLANT
SUT VLOC 180 0 24IN GS25 (SUTURE) ×3 IMPLANT
SYR 50ML LL SCALE MARK (SYRINGE) ×3 IMPLANT
TAPE STRIPS DRAPE STRL (GAUZE/BANDAGES/DRESSINGS) ×3 IMPLANT
TOWEL OR 17X24 6PK STRL BLUE (TOWEL DISPOSABLE) ×3 IMPLANT
TOWEL OR 17X26 10 PK STRL BLUE (TOWEL DISPOSABLE) ×3 IMPLANT
TRAY FOLEY CATH 14FR (SET/KITS/TRAYS/PACK) IMPLANT
UPCHARGE REV TRAY MBT KNEE ×3 IMPLANT
WATER STERILE IRR 1000ML POUR (IV SOLUTION) ×6 IMPLANT

## 2016-05-03 NOTE — Progress Notes (Signed)
Orthopedic Tech Progress Note Patient Details:  Robin Christensen 01-Aug-1950 [id number] Patient was having pain with CPM, adjusted machine, and lowered flexion to 80 degrees. Patient ID: Robin Christensen, female   DOB: 07-05-1951, 65 y.o.   MRN: [id number]   [name] 05/03/2016, 2:03 PM

## 2016-05-03 NOTE — Anesthesia Preprocedure Evaluation (Signed)
Anesthesia Evaluation  Patient identified by MRN, date of birth, ID band Patient awake    Reviewed: Allergy & Precautions, H&P , NPO status , Patient's Chart, lab work & pertinent test results  History of Anesthesia Complications (+) PONV and history of anesthetic complications  Airway Mallampati: II  TM Distance: <3 FB Neck ROM: Full    Dental  (+) Teeth Intact   Pulmonary sleep apnea ,    Pulmonary exam normal        Cardiovascular hypertension, Pt. on medications + dysrhythmias Atrial Fibrillation  Rhythm:Irregular Rate:Abnormal     Neuro/Psych    GI/Hepatic   Endo/Other    Renal/GU      Musculoskeletal   Abdominal Normal abdominal exam  (+)   Peds  Hematology   Anesthesia Other Findings Is on eliquous, per cardiologist can hold for 3 days prior to surgery, can perform neuraxial per our protocol if has been off eliquous for 3 days  Reproductive/Obstetrics                             Anesthesia Physical  Anesthesia Plan  ASA: III  Anesthesia Plan: Spinal   Post-op Pain Management:    Induction: Intravenous  Airway Management Planned: Simple Face Mask  Additional Equipment:   Intra-op Plan:   Post-operative Plan:   Informed Consent: I have reviewed the patients History and Physical, chart, labs and discussed the procedure including the risks, benefits and alternatives for the proposed anesthesia with the patient or authorized representative who has indicated his/her understanding and acceptance.   Dental advisory given  Plan Discussed with: CRNA and Surgeon  Anesthesia Plan Comments:         Anesthesia Quick Evaluation

## 2016-05-03 NOTE — Progress Notes (Signed)
Orthopedic Tech Progress Note Patient Details:  Robin Christensen 07-26-1950 [id number]  CPM Right Knee CPM Right Knee: On Right Knee Flexion (Degrees): 90 Right Knee Extension (Degrees): 0 Additional Comments: Trapeze bar and foot roll   [name] 05/03/2016, 10:58 AM

## 2016-05-03 NOTE — Op Note (Signed)
PREOP DIAGNOSIS: DJD RIGHT KNEE POSTOP DIAGNOSIS: same PROCEDURE: RIGHT TKR ANESTHESIA: Spinal and MAC ATTENDING SURGEON: Martavia Tye G ASSISTANT: Loni Dolly PA  INDICATIONS FOR PROCEDURE: Robin Christensen is a 65 y.o. female who has struggled for a long time with pain due to degenerative arthritis of the right knee.  The patient has failed many conservative non-operative measures and at this point has pain which limits the ability to sleep and walk.  The patient is offered total knee replacement.  Informed operative consent was obtained after discussion of possible risks of anesthesia, infection, neurovascular injury, DVT, and death.  The importance of the post-operative rehabilitation protocol to optimize result was stressed extensively with the patient.  SUMMARY OF FINDINGS AND PROCEDURE:  VERDIA GELB was taken to the operative suite where under the above anesthesia a right knee replacement was performed.  There were advanced degenerative changes and the bone quality was good.  We used the DePuy LCS system and placed size standard plus femur, 4MBT revision tibia, 38 mm all polyethylene patella, and a size 10 mm spacer.  Loni Dolly PA-C assisted throughout and was invaluable to the completion of the case in that he helped retract and maintain exposure while I placed components.  He also helped close thereby minimizing OR time.  The patient was admitted for appropriate post-op care to include perioperative antibiotics and mechanical and pharmacologic measures for DVT prophylaxis.  DESCRIPTION OF PROCEDURE:  ISSIS HASSING was taken to the operative suite where the above anesthesia was applied.  The patient was positioned supine and prepped and draped in normal sterile fashion.  An appropriate time out was performed.  After the administration of kefzol pre-op antibiotic the leg was elevated and exsanguinated and a tourniquet inflated. A standard longitudinal incision was made on the  anterior knee.  Dissection was carried down to the extensor mechanism.  All appropriate anti-infective measures were used including the pre-operative antibiotic, betadine impregnated drape, and closed hooded exhaust systems for each member of the surgical team.  A medial parapatellar incision was made in the extensor mechanism and the knee cap flipped and the knee flexed.  Some residual meniscal tissues were removed along with any remaining ACL/PCL tissue.  A guide was placed on the tibia and a flat cut was made on it's superior surface.  An intramedullary guide was placed in the femur and was utilized to make anterior and posterior cuts creating an appropriate flexion gap.  A second intramedullary guide was placed in the femur to make a distal cut properly balancing the knee with an extension gap equal to the flexion gap.  The three bones sized to the above mentioned sizes and the appropriate guides were placed and utilized.  A trial reduction was done and the knee easily came to full extension and the patella tracked well on flexion.  The trial components were removed and all bones were cleaned with pulsatile lavage and then dried thoroughly.  Cement was mixed and was pressurized onto the bones followed by placement of the aforementioned components.  Excess cement was trimmed and pressure was held on the components until the cement had hardened.  The tourniquet was deflated and a small amount of bleeding was controlled with cautery and pressure.  The knee was irrigated thoroughly.  The extensor mechanism was re-approximated with V-loc suture in running fashion.  The knee was flexed and the repair was solid.  The subcutaneous tissues were re-approximated with #0 and #2-0 vicryl and the skin closed  with a subcuticular stitch and steristrips.  A sterile dressing was applied.  Intraoperative fluids, EBL, and tourniquet time can be obtained from anesthesia records.  DISPOSITION:  The patient was taken to recovery  room in stable condition and admitted for appropriate post-op care to include peri-operative antibiotic and DVT prophylaxis with mechanical and pharmacologic measures.  Aaliyah Gavel G 05/03/2016, 9:10 AM

## 2016-05-03 NOTE — Interval H&P Note (Signed)
History and Physical Interval Note:  05/03/2016 7:16 AM  Robin Christensen  has presented today for surgery, with the diagnosis of OSTEOARTHRITIS RIGHT KNEE  The various methods of treatment have been discussed with the patient and family. After consideration of risks, benefits and other options for treatment, the patient has consented to  Procedure(s): TOTAL KNEE ARTHROPLASTY (Right) as a surgical intervention .  The patient's history has been reviewed, patient examined, no change in status, stable for surgery.  I have reviewed the patient's chart and labs.  Questions were answered to the patient's satisfaction.     Erinne Gillentine G

## 2016-05-03 NOTE — Anesthesia Procedure Notes (Signed)
Procedure Name: MAC Date/Time: 05/03/2016 7:35 AM Performed by: Willeen Cass P Pre-anesthesia Checklist: Patient identified, Emergency Drugs available, Suction available, Patient being monitored and Timeout performed Oxygen Delivery Method: Simple face mask Intubation Type: IV induction Placement Confirmation: positive ETCO2 Dental Injury: Teeth and Oropharynx as per pre-operative assessment

## 2016-05-03 NOTE — Transfer of Care (Signed)
Immediate Anesthesia Transfer of Care Note  Patient: Robin Christensen  Procedure(s) Performed: Procedure(s): TOTAL KNEE ARTHROPLASTY (Right)  Patient Location: PACU  Anesthesia Type:MAC  Level of Consciousness: awake, alert , oriented and patient cooperative  Airway & Oxygen Therapy: Patient Spontanous Breathing and Patient connected to face mask oxygen  Post-op Assessment: Report given to RN, Post -op Vital signs reviewed and stable and Patient moving all extremities X 4  Post vital signs: Reviewed and stable  Last Vitals:  Vitals:   05/03/16 0718 05/03/16 0945  BP: (!) 119/46   Pulse: (!) 48   Resp: 18   Temp: 36.8 C 36.4 C    Last Pain:  Vitals:   05/03/16 0718  TempSrc: Oral  PainSc:          Complications: No apparent anesthesia complications

## 2016-05-03 NOTE — Anesthesia Procedure Notes (Signed)
Spinal  Patient location during procedure: OR Staffing Anesthesiologist: Amberlyn Martinezgarcia Performed: anesthesiologist  Preanesthetic Checklist Completed: patient identified, site marked, surgical consent, pre-op evaluation, timeout performed, IV checked, risks and benefits discussed and monitors and equipment checked Spinal Block Patient position: sitting Prep: DuraPrep Patient monitoring: blood pressure, continuous pulse ox, cardiac monitor and heart rate Approach: midline Location: L4-5 Injection technique: single-shot Needle Needle type: Pencan  Needle gauge: 24 G Assessment Sensory level: T6 Additional Notes Functioning IV was confirmed and monitors were applied. Sterile prep and drape, including hand hygiene, mask and sterile gloves were used. The patient was positioned and the spine was prepped. The skin was anesthetized with lidocaine.  Free flow of clear CSF was obtained prior to injecting local anesthetic into the CSF.  The spinal needle aspirated freely following injection.  The needle was carefully withdrawn.  The patient tolerated the procedure well. Consent was obtained prior to procedure with all questions answered and concerns addressed.  [name], MD

## 2016-05-03 NOTE — Anesthesia Postprocedure Evaluation (Signed)
Anesthesia Post Note  Patient: Robin Christensen  Procedure(s) Performed: Procedure(s) (LRB): TOTAL KNEE ARTHROPLASTY (Right)  Patient location during evaluation: PACU Anesthesia Type: Spinal Level of consciousness: oriented and awake and alert Pain management: pain level controlled Vital Signs Assessment: post-procedure vital signs reviewed and stable Respiratory status: spontaneous breathing, respiratory function stable and patient connected to nasal cannula oxygen Cardiovascular status: blood pressure returned to baseline and stable Postop Assessment: no headache and no backache Anesthetic complications: no    Last Vitals:  Vitals:   05/03/16 1000 05/03/16 1015  BP: (!) 143/57 (!) 152/65  Pulse:    Resp:    Temp:      Last Pain:  Vitals:   05/03/16 1000  TempSrc:   PainSc: 4                  Robin Christensen

## 2016-05-04 MED ORDER — PROMETHAZINE HCL 25 MG/ML IJ SOLN: 12.5000 mg | Freq: Four times a day (QID) | INTRAMUSCULAR | Status: DC | PRN

## 2016-05-04 MED ORDER — PROMETHAZINE HCL 25 MG PO TABS
12.5000 mg | ORAL_TABLET | Freq: Four times a day (QID) | ORAL | Status: DC | PRN
  Administered 2016-05-04: 25 mg via ORAL
  Filled 2016-05-04: qty 1

## 2016-05-04 NOTE — Evaluation (Signed)
Physical Therapy Evaluation Patient Details Name: Robin Christensen MRN: [id number] DOB: 1950/09/23 Today's Date: 05/04/2016   History of Present Illness  Pt is a 65 y/o female s/p R TKA. PMH including but not limited to HTN and obesity.  Clinical Impression  Pt presented supine in bed with HOB elevated, awake and willing to participate in therapy session. Prior to admission, pt reported using a SPC to ambulate with on days when her knee pain increased. Pt limited this session secondary to pain and nausea upon sitting EOB. Pt able to perform STS with mod A and SPT with min A and RW to recliner in room. Pt would continue to benefit from skilled physical therapy services at this time while admitted and after d/c to address her below listed limitations in order to improve her overall safety and independence with functional mobility.     Follow Up Recommendations SNF    Equipment Recommendations  Other (comment) (defer to next venue)    Recommendations for Other Services       Precautions / Restrictions Precautions Precautions: Fall;Knee Precaution Booklet Issued: Yes (comment) Required Braces or Orthoses: Knee Immobilizer - Right Restrictions Weight Bearing Restrictions: Yes RLE Weight Bearing: Weight bearing as tolerated      Mobility  Bed Mobility Overal bed mobility: Needs Assistance Bed Mobility: Supine to Sit     Supine to sit: Min assist;HOB elevated     General bed mobility comments: pt required increased time and min A with R LE movement  Transfers Overall transfer level: Needs assistance Equipment used: Rolling walker (2 wheeled) Transfers: Sit to/from [facility] Sit to Stand: Mod assist Stand pivot transfers: Min assist       General transfer comment: pt required increased time, VC'ing for bilateral hand placement and mod A to achieve full, erect standing position. Pt perform STS x3 during this session. With SPT, pt required min A with  movement of RW and for safety  Ambulation/Gait                Stairs            Wheelchair Mobility    Modified Rankin (Stroke Patients Only)       Balance Overall balance assessment: Needs assistance Sitting-balance support: Feet supported;Bilateral upper extremity supported Sitting balance-Leahy Scale: Poor     Standing balance support: During functional activity;Bilateral upper extremity supported Standing balance-Leahy Scale: Poor Standing balance comment: pt reliant on bilateral UEs on RW                             Pertinent Vitals/Pain Pain Assessment: 0-10 Pain Score: 4  Pain Location: R knee Pain Descriptors / Indicators: Grimacing;Guarding;Sore Pain Intervention(s): Monitored during session;Repositioned    Home Living Family/patient expects to be discharged to:: Skilled nursing facility Living Arrangements: Alone             Home Equipment: Kasandra Knudsen - single point      Prior Function Level of Independence: Independent with assistive device(s)         Comments: Pt reported using SPC to ambulate with when she was having increased pain.     Hand Dominance        Extremity/Trunk Assessment   Upper Extremity Assessment: Defer to OT evaluation           Lower Extremity Assessment: RLE deficits/detail RLE Deficits / Details: pt with decreased strength and ROM limitations secondary to post-op. Pt  with decreased sensation on dorsal aspect of foot.       Communication   Communication: No difficulties  Cognition Arousal/Alertness: Awake/alert Behavior During Therapy: WFL for tasks assessed/performed Overall Cognitive Status: Within Functional Limits for tasks assessed                      General Comments      Exercises Total Joint Exercises Long Arc Quad: AAROM;Right;10 reps;Seated Knee Flexion: AAROM;Right;10 reps;Seated Goniometric ROM: Flexion = 70 degrees; Extension = lacking 18 degrees to neutral;  measured in sitting   Assessment/Plan    PT Assessment Patient needs continued PT services  PT Problem List Decreased strength;Decreased range of motion;Decreased activity tolerance;Decreased balance;Decreased mobility;Decreased coordination;Decreased knowledge of use of DME;Pain          PT Treatment Interventions DME instruction;Gait training;Stair training;Functional mobility training;Therapeutic activities;Therapeutic exercise;Balance training;Neuromuscular re-education;Patient/family education    PT Goals (Current goals can be found in the Care Plan section)  Acute Rehab PT Goals Patient Stated Goal: go to rehab prior to d/c home PT Goal Formulation: With patient Time For Goal Achievement: 05/11/16 Potential to Achieve Goals: Good    Frequency 7X/week   Barriers to discharge        Co-evaluation               End of Session Equipment Utilized During Treatment: Gait belt;Right knee immobilizer Activity Tolerance: Patient limited by pain;Other (comment) (pt limited secondary to increased nausea) Patient left: in chair;with call bell/phone within reach Nurse Communication: Mobility status         Time: 0930-1001 PT Time Calculation (min) (ACUTE ONLY): 31 min   Charges:   PT Evaluation $PT Eval Moderate Complexity: 1 Procedure PT Treatments $Therapeutic Activity: 8-22 mins   PT G CodesClearnce Sorrel Jayshon Dommer 05/04/2016, 10:14 AM Sherie Don, PT, DPT [phone]

## 2016-05-04 NOTE — NC FL2 (Signed)
[place] LEVEL OF CARE SCREENING TOOL     IDENTIFICATION  Patient Name: Robin Christensen Birthdate: 1950/10/08 Sex: female Admission Date (Current Location): 05/03/2016  [facility] and [place] Number:  Herbalist and Address:  [facility], [place] [address] [place], [place] [address]      Provider Number: [id number]  Attending Physician Name and Address:  Melrose Nakayama, MD  Relative Name and Phone Number:       Current Level of Care: Hospital Recommended Level of Care: [place] Prior Approval Number:    Date Approved/Denied:   PASRR Number: [id number]  Discharge Plan: SNF    Current Diagnoses: Patient Active Problem List   Diagnosis Date Noted  . Primary localized osteoarthritis of right knee 05/03/2016  . Primary osteoarthritis of right knee 05/03/2016    Orientation RESPIRATION BLADDER Height & Weight     Self, Time, Situation, Place    Continent Weight: 221 lb (100.2 kg) Height:  5\' 8"  (172.7 cm)  BEHAVIORAL SYMPTOMS/MOOD NEUROLOGICAL BOWEL NUTRITION STATUS      Continent    AMBULATORY STATUS COMMUNICATION OF NEEDS Skin   Extensive Assist Verbally Surgical wounds                       Personal Care Assistance Level of Assistance  Bathing, Feeding, Dressing Bathing Assistance: Limited assistance Feeding assistance: Limited assistance Dressing Assistance: Limited assistance     Functional Limitations Info  Sight (glasses) Sight Info: Impaired        SPECIAL CARE FACTORS FREQUENCY  PT (By licensed PT), OT (By licensed OT)                    Contractures Contractures Info: Not present    Additional Factors Info  Code Status Code Status Info: FULL CODE             Current Medications (05/04/2016):  This is the current hospital active medication list Current Facility-Administered Medications  Medication Dose Route Frequency Provider Last Rate Last Dose  .  acetaminophen (TYLENOL) tablet 650 mg  650 mg Oral Q6H PRN Loni Dolly, PA-C       Or  . acetaminophen (TYLENOL) suppository 650 mg  650 mg Rectal Q6H PRN Loni Dolly, PA-C      . alum & mag hydroxide-simeth (MAALOX/MYLANTA) 200-200-20 MG/5ML suspension 30 mL  30 mL Oral Q4H PRN Loni Dolly, PA-C      . apixaban (ELIQUIS) tablet 5 mg  5 mg Oral BID Loni Dolly, PA-C   5 mg at 05/04/16 0900  . bisacodyl (DULCOLAX) EC tablet 5 mg  5 mg Oral Daily PRN Loni Dolly, PA-C      . diphenhydrAMINE (BENADRYL) 12.5 MG/5ML elixir 12.5-25 mg  12.5-25 mg Oral Q4H PRN Loni Dolly, PA-C      . docusate sodium (COLACE) capsule 100 mg  100 mg Oral BID Loni Dolly, PA-C   100 mg at 05/04/16 0900  . famotidine (PEPCID) tablet 10 mg  10 mg Oral Daily PRN Loni Dolly, PA-C      . furosemide (LASIX) tablet 40 mg  40 mg Oral Daily Loni Dolly, PA-C   40 mg at 05/04/16 0901  . HYDROcodone-acetaminophen (NORCO/VICODIN) 5-325 MG per tablet 1 tablet  1 tablet Oral Once PRN Jillyn Hidden, MD      . HYDROcodone-acetaminophen (NORCO/VICODIN) 5-325 MG per tablet 1-2 tablet  1-2 tablet Oral Q4H PRN Loni Dolly, PA-C  2 tablet at 05/04/16 0645  . HYDROmorphone (DILAUDID) injection 0.5-1 mg  0.5-1 mg Intravenous Q3H PRN Einar Grad, RPH   1 mg at 05/04/16 0900  . lactated ringers infusion   Intravenous Continuous Loni Dolly, PA-C 50 mL/hr at 05/04/16 0100    . levothyroxine (SYNTHROID, LEVOTHROID) tablet 75 mcg  75 mcg Oral QAC breakfast Loni Dolly, PA-C   75 mcg at 05/04/16 0900  . menthol-cetylpyridinium (CEPACOL) lozenge 3 mg  1 lozenge Oral PRN Loni Dolly, PA-C       Or  . phenol (CHLORASEPTIC) mouth spray 1 spray  1 spray Mouth/Throat PRN Loni Dolly, PA-C      . methocarbamol (ROBAXIN) tablet 500 mg  500 mg Oral Q6H PRN Loni Dolly, PA-C   500 mg at 05/04/16 0646   Or  . methocarbamol (ROBAXIN) 500 mg in dextrose 5 % 50 mL IVPB  500 mg Intravenous Q6H PRN Loni Dolly, PA-C      . metoCLOPramide (REGLAN) tablet  5-10 mg  5-10 mg Oral Q8H PRN Loni Dolly, PA-C       Or  . metoCLOPramide (REGLAN) injection 5-10 mg  5-10 mg Intravenous Q8H PRN Loni Dolly, PA-C   10 mg at 05/04/16 0424  . metoprolol succinate (TOPROL-XL) 24 hr tablet 25 mg  25 mg Oral QHS Loni Dolly, PA-C   25 mg at 05/03/16 2122  . ondansetron (ZOFRAN) tablet 4 mg  4 mg Oral Q6H PRN Loni Dolly, PA-C       Or  . ondansetron [facility]) injection 4 mg  4 mg Intravenous Q6H PRN Loni Dolly, PA-C   4 mg at 05/03/16 2308  . pravastatin (PRAVACHOL) tablet 40 mg  40 mg Oral Daily Loni Dolly, PA-C   40 mg at 05/04/16 0900  . promethazine (PHENERGAN) tablet 12.5-25 mg  12.5-25 mg Oral Q6H PRN Loni Dolly, PA-C   25 mg at 05/04/16 0900   Or  . promethazine (PHENERGAN) injection 12.5-25 mg  12.5-25 mg Intravenous Q6H PRN Loni Dolly, PA-C         Discharge Medications: Please see discharge summary for a list of discharge medications.  Relevant Imaging Results:  Relevant Lab Results:   Additional Information SS#: [id number]  Amador Cunas, [place]

## 2016-05-04 NOTE — Progress Notes (Signed)
Subjective: 1 Day Post-Op Procedure(s) (LRB): TOTAL KNEE ARTHROPLASTY (Right)   Patient having some persistent nausea but otherwise if doing ok.   Activity level:  wbat Diet tolerance:  ok Voiding:  ok Patient reports pain as mild and moderate.    Objective: Vital signs in last 24 hours: Temp:  [97.2 F (36.2 C)-99.8 F (37.7 C)] 99.1 F (37.3 C) (10/18 0015) Pulse Rate:  [51-64] 59 (10/18 0015) Resp:  [9-18] 16 (10/18 0015) BP: (127-157)/(45-74) 128/58 (10/18 0015) SpO2:  [97 %-100 %] 98 % (10/18 0015)  Labs: No results for input(s): HGB in the last 72 hours. No results for input(s): WBC, RBC, HCT, PLT in the last 72 hours. No results for input(s): NA, K, CL, CO2, BUN, CREATININE, GLUCOSE, CALCIUM in the last 72 hours.  Recent Labs  05/03/16 0624  INR 1.00    Physical Exam:  Neurologically intact ABD soft Neurovascular intact Sensation intact distally Intact pulses distally Dorsiflexion/Plantar flexion intact Incision: dressing C/D/I and no drainage No cellulitis present Compartment soft  Assessment/Plan:  1 Day Post-Op Procedure(s) (LRB): TOTAL KNEE ARTHROPLASTY (Right) Advance diet Up with therapy D/C IV fluids Plan for discharge tomorrow Discharge to SNF if doing well with PT. Follow up in office 2 weeks post op. We have resumed her Eliquis for DVT prevention.  I will change her bandage to aquacel tomorrow.   Tag Wurtz, Larwance Sachs 05/04/2016, 8:04 AM

## 2016-05-04 NOTE — Progress Notes (Signed)
Physical Therapy Treatment Patient Details Name: Robin Christensen MRN: [id number] DOB: 07-03-1951 Today's Date: 05/04/2016    History of Present Illness Pt is a 65 y/o female s/p R TKA. PMH including but not limited to HTN and obesity.    PT Comments    Pt presented supine in bed with HOB elevated, R LE in CPM, awake and willing to participate in therapy session. Pt performed SPT x2 during session from bed to bedside commode and then to recliner. Pt making slow progress towards achieving her functional goals. Pt would continue to benefit from skilled physical therapy services at this time while admitted and after d/c to address her limitations in order to improve her overall safety and independence with functional mobility.   Follow Up Recommendations  SNF     Equipment Recommendations  Other (comment) (defer to next venue)    Recommendations for Other Services       Precautions / Restrictions Precautions Precautions: Fall;Knee Precaution Booklet Issued: Yes (comment) Required Braces or Orthoses: Knee Immobilizer - Right Restrictions Weight Bearing Restrictions: Yes RLE Weight Bearing: Weight bearing as tolerated    Mobility  Bed Mobility Overal bed mobility: Needs Assistance Bed Mobility: Supine to Sit     Supine to sit: Min assist;HOB elevated     General bed mobility comments: pt required increased time and min A with R LE movement  Transfers Overall transfer level: Needs assistance Equipment used: Rolling walker (2 wheeled) Transfers: Sit to/from [facility] Sit to Stand: Mod assist Stand pivot transfers: Min guard       General transfer comment: pt required increased time, VC'ing for bilateral hand placement and mod A to achieve full, erect standing position.   Ambulation/Gait                 Stairs            Wheelchair Mobility    Modified Rankin (Stroke Patients Only)       Balance Overall balance assessment:  Needs assistance Sitting-balance support: Feet supported;Bilateral upper extremity supported Sitting balance-Leahy Scale: Poor     Standing balance support: During functional activity;Bilateral upper extremity supported Standing balance-Leahy Scale: Poor Standing balance comment: pt reliant on bilateral UEs on RW                    Cognition Arousal/Alertness: Awake/alert Behavior During Therapy: WFL for tasks assessed/performed Overall Cognitive Status: Within Functional Limits for tasks assessed                      Exercises      General Comments        Pertinent Vitals/Pain Pain Assessment: Faces Faces Pain Scale: Hurts little more Pain Location: R knee Pain Descriptors / Indicators: Sore;Guarding Pain Intervention(s): Monitored during session;Repositioned    Home Living                      Prior Function            PT Goals (current goals can now be found in the care plan section) Acute Rehab PT Goals Patient Stated Goal: go to rehab prior to d/c home PT Goal Formulation: With patient Time For Goal Achievement: 05/11/16 Potential to Achieve Goals: Good Progress towards PT goals: Progressing toward goals    Frequency    7X/week      PT Plan Current plan remains appropriate    Co-evaluation  End of Session Equipment Utilized During Treatment: Gait belt;Right knee immobilizer Activity Tolerance: Patient limited by pain;Patient limited by fatigue Patient left: in chair;with call bell/phone within reach     Time: [id number] PT Time Calculation (min) (ACUTE ONLY): 20 min  Charges:  $Therapeutic Activity: 8-22 mins                    G CodesClearnce Sorrel Floetta Brickey May 23, 2016, 5:44 PM Sherie Don, [facility], DPT [phone]

## 2016-05-04 NOTE — Clinical Social Work Placement (Signed)
   CLINICAL SOCIAL WORK PLACEMENT  NOTE  Date:  05/04/2016  Patient Details  Name: Robin Christensen MRN: [id number] Date of Birth: 1950-08-31  Clinical Social Work is seeking post-discharge placement for this patient at the [place] level of care (*CSW will initial, date and re-position this form in  chart as items are completed):  Yes   Patient/family provided with [place] Work Department's list of facilities offering this level of care within the geographic area requested by the patient (or if unable, by the patient's family).  Yes   Patient/family informed of their freedom to choose among providers that offer the needed level of care, that participate in Medicare, Medicaid or managed care program needed by the patient, have an available bed and are willing to accept the patient.  Yes   Patient/family informed of [facility]'s ownership interest in [facility] and [facility], as well as of the fact that they are under no obligation to receive care at these facilities.  PASRR submitted to EDS on       PASRR number received on       Existing PASRR number confirmed on       FL2 transmitted to all facilities in geographic area requested by pt/family on       FL2 transmitted to all facilities within larger geographic area on       Patient informed that his/her managed care company has contracts with or will negotiate with certain facilities, including the following:        Yes   Patient/family informed of bed offers received.  Patient chooses bed at [facility]     Physician recommends and patient chooses bed at [facility]    Patient to be transferred to [facility] on  .  Patient to be transferred to facility by       Patient family notified on   of transfer.  Name of family member notified:        PHYSICIAN Please sign FL2     Additional Comment:     _______________________________________________ Amador Cunas, LCSW 05/04/2016, 2:15 PM

## 2016-05-04 NOTE — Discharge Instructions (Signed)

## 2016-05-04 NOTE — Clinical Social Work Note (Signed)
Clinical Social Work Assessment  Patient Details  Name: Robin Christensen MRN: [id number] Date of Birth: 09/10/1950  Date of referral:  05/04/16               Reason for consult:  Facility Placement                Permission sought to share information with:  Chartered certified accountant granted to share information::  Yes, Verbal Permission Granted  Name::        Agency::  [facility]  Relationship::     Contact Information:     Housing/Transportation Living arrangements for the past 2 months:  Single Family Home Source of Information:  Patient Patient Interpreter Needed:  None Criminal Activity/Legal Involvement Pertinent to Current Situation/Hospitalization:  No - Comment as needed Significant Relationships:  Adult Children Lives with:  Self Do you feel safe going back to the place where you live?    Need for family participation in patient care:  No (Coment)  Care giving concerns:     Facilities manager / plan:  CSW met with pt re PT recommendation for SNF. Pt reports agreeable to SNF and completed admission paperwork with [facility] pre[facility]. CSW will complete FL2/PASRR and send necessary paperwork to [facility].   Employment status:  Retired Nurse, adult PT Recommendations:  [place] / Referral to community resources:  [facility])  Patient/Family's Response to care:  Pt verbalized understanding of d/c plan and reports agreeable.   Patient/Family's Understanding of and Emotional Response to Diagnosis, Current Treatment, and Prognosis:    Emotional Assessment Appearance:  Appears stated age Attitude/Demeanor/Rapport:    Affect (typically observed):    Orientation:  Oriented to Self, Oriented to Situation, Oriented to Place, Oriented to  Time Alcohol / Substance use:  Not Applicable Psych involvement (Current and /or in the community):  No (Comment)  Discharge Needs  Concerns to be  addressed:  Discharge Planning Concerns Readmission within the last 30 days:  No Current discharge risk:    Barriers to Discharge:  Insurance Authorization   Amador Cunas, [place] 05/04/2016, 2:04 PM

## 2016-05-04 NOTE — Progress Notes (Signed)
Orthopedic Tech Progress Note Patient Details:  Robin Christensen 1951/04/10 [id number]  Patient ID: [name], female   DOB: June 04, 1951, 65 y.o.   MRN: [id number] Applied cpm 0-50  [name] 05/04/2016, 6:45 AM

## 2016-05-04 NOTE — Progress Notes (Signed)
Orthopedic Tech Progress Note Patient Details:  Robin Christensen 03/07/1951 [id number]  Patient ID: Robin Christensen, female   DOB: 1951/02/16, 65 y.o.   MRN: [id number]   Robin Christensen 05/04/2016, 2:24 PM Placed pt's rle on cpm @0 -50 degrees @1425 ; RN notified

## 2016-05-05 ENCOUNTER — Encounter (HOSPITAL_COMMUNITY): Payer: Self-pay | Admitting: Orthopaedic Surgery

## 2016-05-05 NOTE — Evaluation (Signed)
Occupational Therapy Evaluation Patient Details Name: Robin Christensen MRN: [id number] DOB: [birth year] Today's Date: 05/05/2016    History of Present Illness Pt is a 65 y/o female s/p R TKA. PMH including but not limited to HTN and obesity.   Clinical Impression   PTA Pt independent in ADL and used a SPC for mobility. Pt currently max assist for LB ADL and min assist with RW for short distances (bout of dizziness during mobilization today prior to ADL). Pt making progress towards goals, and agreeable to therapy. Pt continues to benefit from skilled OT in the acute setting, prior to d/c to SNF for further therapy to maximize independence in ADL. Next session to practice grabber, and work on standing ADL.    Follow Up Recommendations  SNF    Equipment Recommendations  3 in 1 bedside comode;Other (comment) (to be finalized by next venue of care)    Recommendations for Other Services       Precautions / Restrictions Precautions Precautions: Fall;Knee Precaution Booklet Issued: Yes (comment) Required Braces or Orthoses: Knee Immobilizer - Right Knee Immobilizer - Right: On when out of bed or walking Restrictions Weight Bearing Restrictions: Yes RLE Weight Bearing: Weight bearing as tolerated      Mobility Bed Mobility Overal bed mobility: Needs Assistance Bed Mobility: Supine to Sit     Supine to sit: Min assist;HOB elevated     General bed mobility comments: pt required increased time and min A with R LE movement  Transfers Overall transfer level: Needs assistance Equipment used: Rolling walker (2 wheeled) Transfers: Sit to/from Stand Sit to Stand: Min assist         General transfer comment: Pt demonstrating safe hand placement on bed/RW for power up, required increased time    Balance Overall balance assessment: Needs assistance Sitting-balance support: No upper extremity supported;Feet supported Sitting balance-Leahy Scale: Fair     Standing balance  support: Bilateral upper extremity supported;During functional activity Standing balance-Leahy Scale: Poor                              ADL Overall ADL's : Needs assistance/impaired Eating/Feeding: Modified independent;Sitting   Grooming: Wash/dry hands;Wash/dry face;Oral care;Brushing hair;Set up;Sitting Grooming Details (indicate cue type and reason): in recliner Upper Body Bathing: Set up;Sitting Upper Body Bathing Details (indicate cue type and reason): sponge bath in recliner Lower Body Bathing: Maximal assistance (educated on long handle sponge. Pt says she already has one)   Upper Body Dressing : Sitting;Set up   Lower Body Dressing: Moderate assistance;Sit to/from stand (Ed on AE kit, interested Civil Service fast streamer but no practice)   Armed forces technical officer: Min guard;Ambulation;BSC;RW Toilet Transfer Details (indicate cue type and reason): Pt able to ambulate 7 feet to door of bathroom before feeling dizzy/unsteady. Pt used BSC. Toileting- Water quality scientist and Hygiene: Min guard;Sit to/from stand   Tub/ Banker: Walk-in shower;Moderate assistance;Cueing for sequencing;Ambulation;3 in 1;Rolling walker Tub/Shower Transfer Details (indicate cue type and reason): educated on safety of strong leg in first, then operated leg. Functional mobility during ADLs: Minimal assistance;Rolling walker;Cueing for sequencing (walked with RW about 7 feet and then had to sit due to dizzy) General ADL Comments: concerned about taking care of her pets too. "I guess I'll have to get someone to help me with them"     Vision     Perception     Praxis      Pertinent Vitals/Pain Pain Assessment: 0-10 Pain Score:  4  Pain Location: R knee Pain Descriptors / Indicators: Grimacing;Sore Pain Intervention(s): Limited activity within patient's tolerance;Monitored during session;Repositioned;Ice applied     Hand Dominance Right   Extremity/Trunk Assessment Upper Extremity  Assessment Upper Extremity Assessment: Overall WFL for tasks assessed   Lower Extremity Assessment Lower Extremity Assessment: RLE deficits/detail RLE Deficits / Details: decreased strength and ROM post-op   Cervical / Trunk Assessment Cervical / Trunk Assessment: Normal   Communication Communication Communication: No difficulties   Cognition Arousal/Alertness: Awake/alert Behavior During Therapy: WFL for tasks assessed/performed Overall Cognitive Status: Within Functional Limits for tasks assessed                     General Comments       Exercises       Shoulder Instructions      Home Living Family/patient expects to be discharged to:: Skilled nursing facility Living Arrangements: Alone                           Home Equipment: Cane - single point   Additional Comments: has a dog and 2 cats      Prior Functioning/Environment Level of Independence: Independent with assistive device(s)        Comments: Pt reported using SPC to ambulate with when she was having increased pain.        OT Problem List: Decreased strength;Decreased range of motion;Decreased activity tolerance;Impaired balance (sitting and/or standing);Decreased knowledge of use of DME or AE;Obesity;Pain   OT Treatment/Interventions: Self-care/ADL training;DME and/or AE instruction;Therapeutic activities;Patient/family education;Balance training    OT Goals(Current goals can be found in the care plan section) Acute Rehab OT Goals Patient Stated Goal: go to rehab prior to d/c home OT Goal Formulation: With patient Time For Goal Achievement: 05/12/16 Potential to Achieve Goals: Good ADL Goals Pt Will Perform Grooming: with supervision;standing Pt Will Perform Lower Body Bathing: with supervision;with adaptive equipment;sit to/from stand Pt Will Perform Lower Body Dressing: with adaptive equipment;with modified independence;sit to/from stand Pt Will Transfer to Toilet: with  modified independence;bedside commode;ambulating Pt Will Perform Toileting - Clothing Manipulation and hygiene: with modified independence;sit to/from stand Pt Will Perform Tub/Shower Transfer: Shower transfer;with modified independence;ambulating;rolling walker  OT Frequency: Min 2X/week   Barriers to D/C: Decreased caregiver support (lives alone)          Co-evaluation              End of Session Equipment Utilized During Treatment: Gait belt;Rolling walker;Right knee immobilizer;Oxygen (once sitting in recliner, during ADL) CPM Right Knee CPM Right Knee: Off Right Knee Flexion (Degrees): 50 Right Knee Extension (Degrees): 0 Nurse Communication: Mobility status  Activity Tolerance: Patient tolerated treatment well Patient left: in chair;with call bell/phone within reach   Time: 0831-0921 OT Time Calculation (min): 50 min Charges:  OT General Charges $OT Visit: 1 Procedure OT Evaluation $OT Eval Low Complexity: 1 Procedure OT Treatments $Self Care/Home Management : 23-37 mins G-Codes:    Merri Ray Oluwatamilore Starnes 2016/06/04, 9:44 AM  Hulda Humphrey OTR/L [phone]

## 2016-05-05 NOTE — Consult Note (Signed)
           [facility] CM Primary Care Navigator  05/05/2016  LORELLE RINTOUL Aug 08, 1950 [id number]   Patient seen at the bedside to identify discharge needs. Patient shared that she had been having limited activities and movements due to increased/ worsening pain to right knee wherein other interventions failed that had led to this admission/ surgery. Patient mentioned that discharge plan is for short term rehabilitation at [facility] skilled nursing facility.  Patient confirms that primary care provider is Dr. London Pepper with [place] at [facility]. Patient verbalized being able to drive prior to this admission. Transportation to doctors' appointments will be provided by best friend Mickel Baas) or neighbors as stated. [employer] transportation benefits discussed with patient.  Patient states using [facility] service for regular prescriptions and [facility] [place] for immediate needs. She denies difficulty in obtaining and affording medications. Patient mentioned that cardiologist is assisting her with Eliquis medication while in the "donut hole".  Patient does her own  medication management at home straight from the bottle containers as patient stated .  Patient will be the primary caregiver for herself at home but has a neighbor Hassan Rowan) who will be available to assist her when needed per patient.  Patient had expressed understanding to call primary care provider's office for a post discharge follow-up appointment within a week or sooner if needs arise, once discharged home. Patient letter provided as a reminder.  Patient denies further needs or concerns at this time.  For questions, please contact:  Dannielle Huh, BSN, RN- [facility] Primary Care Navigator  Telephone: [phone] [place]

## 2016-05-05 NOTE — Progress Notes (Signed)
Orthopedic Tech Progress Note Patient Details:  Robin Christensen [birth year] [id number]  Patient ID: Robin Christensen, female   DOB: 1950-12-20, 65 y.o.   MRN: [id number] Applied cpm 0-50  Robin Christensen 05/05/2016, 6:20 AM

## 2016-05-05 NOTE — Progress Notes (Signed)
Subjective: 2 Days Post-Op Procedure(s) (LRB): TOTAL KNEE ARTHROPLASTY (Right)   Patient is doing well but has not walked into hallway yet. She has a bed available at SNF today but she does not appear quite ready to go yet.  Activity level:  wbat Diet tolerance:  ok Voiding:  ok Patient reports pain as mild.    Objective: Vital signs in last 24 hours: Temp:  [98.5 F (36.9 C)-98.8 F (37.1 C)] 98.5 F (36.9 C) (10/19 0429) Pulse Rate:  [57-62] 57 (10/19 0429) Resp:  [16-17] 16 (10/19 0429) BP: (107-117)/(41-50) 107/46 (10/19 0429) SpO2:  [97 %-100 %] 100 % (10/19 0429)  Labs: No results for input(s): HGB in the last 72 hours. No results for input(s): WBC, RBC, HCT, PLT in the last 72 hours. No results for input(s): NA, K, CL, CO2, BUN, CREATININE, GLUCOSE, CALCIUM in the last 72 hours.  Recent Labs  05/03/16 0624  INR 1.00    Physical Exam:  Neurologically intact ABD soft Neurovascular intact Sensation intact distally Intact pulses distally Dorsiflexion/Plantar flexion intact Incision: dressing C/D/I and no drainage No cellulitis present Compartment soft  Assessment/Plan:  2 Days Post-Op Procedure(s) (LRB): TOTAL KNEE ARTHROPLASTY (Right) Advance diet Up with therapy Plan for discharge tomorrow Discharge to SNF if cleared by PT and doing well. Bandage was changed to aquacel today. Continue home eliquis for DVT prevention. Follow up in office 2 weeks post op.    Robin Christensen, Larwance Sachs 05/05/2016, 1:26 PM

## 2016-05-05 NOTE — Care Management Note (Signed)
Case Management Note  Patient Details  Name: Robin Christensen MRN: [id number] Date of Birth: [birth year]  Subjective/Objective:    CM following for progression and d/c pllanning.                 Action/Plan: 05/05/2016 Noted plan for d/c to SNF today. No DME or HH needs at this time. Pt for D/C to [facility] today.   Expected Discharge Date:     05/05/2016             Expected Discharge Plan:  Skilled Nursing Facility  In-House Referral:  Clinical Social Work  Discharge planning Services  NA  Post Acute Care Choice:  NA Choice offered to:  NA  DME Arranged:   NA DME Agency:   NA  HH Arranged:   NA HH Agency:   NA  Status of Service:   Complete.   If discussed at [place] of Stay Meetings, dates discussed:    Additional Comments:  Adron Bene, RN 05/05/2016, 10:39 AM

## 2016-05-05 NOTE — Progress Notes (Signed)
Physical Therapy Treatment Patient Details Name: Robin Christensen MRN: [id number] DOB: 14-Jul-1951 Today's Date: 05/05/2016    History of Present Illness Pt is a 65 y/o female s/p R TKA. PMH including but not limited to HTN and obesity.    PT Comments    Pt presented sitting OOB in recliner when PT entered room. Pt making good progress towards achieving her functional goals and was able to increase distance ambulated this session. Pt would continue to benefit from skilled physical therapy services at this time while admitted and after d/c to address her limitations in order to improve her overall safety and independence with functional mobility.   Follow Up Recommendations  SNF     Equipment Recommendations  Other (comment) (defer to next venue)    Recommendations for Other Services       Precautions / Restrictions Precautions Precautions: Fall;Knee Precaution Booklet Issued: Yes (comment) Required Braces or Orthoses: Knee Immobilizer - Right Restrictions Weight Bearing Restrictions: Yes RLE Weight Bearing: Weight bearing as tolerated    Mobility  Bed Mobility               General bed mobility comments: pt sitting OOB in recliner when PT entered room  Transfers Overall transfer level: Needs assistance Equipment used: Rolling walker (2 wheeled) Transfers: Sit to/from Stand Sit to Stand: Min guard         General transfer comment: Pt required increased time, good hand placement  Ambulation/Gait Ambulation/Gait assistance: Min guard Ambulation Distance (Feet): 40 Feet (with multiple standing rest breaks) Assistive device: Rolling walker (2 wheeled) Gait Pattern/deviations: Step-to pattern;Step-through pattern;Decreased step length - left;Decreased stance time - right;Decreased weight shift to right Gait velocity: decreased Gait velocity interpretation: Below normal speed for age/gender General Gait Details: pt required multiple standing rest breaks  secondary to fatigue during ambulation   Stairs            Wheelchair Mobility    Modified Rankin (Stroke Patients Only)       Balance Overall balance assessment: Needs assistance Sitting-balance support: Feet supported;No upper extremity supported Sitting balance-Leahy Scale: Fair     Standing balance support: During functional activity;Bilateral upper extremity supported Standing balance-Leahy Scale: Poor Standing balance comment: pt reliant on bilateral UEs on RW                    Cognition Arousal/Alertness: Awake/alert Behavior During Therapy: WFL for tasks assessed/performed Overall Cognitive Status: Within Functional Limits for tasks assessed                      Exercises Total Joint Exercises Heel Slides: AAROM;Right;10 reps;Seated Hip ABduction/ADduction: AAROM;Right;10 reps;Seated Straight Leg Raises: AAROM;Right;10 reps;Seated    General Comments        Pertinent Vitals/Pain Pain Assessment: Faces Faces Pain Scale: Hurts a little bit Pain Location: R knee Pain Descriptors / Indicators: Sore Pain Intervention(s): Monitored during session;Repositioned    Home Living                      Prior Function            PT Goals (current goals can now be found in the care plan section) Acute Rehab PT Goals Patient Stated Goal: go to rehab prior to d/c home PT Goal Formulation: With patient Time For Goal Achievement: 05/11/16 Potential to Achieve Goals: Good Progress towards PT goals: Progressing toward goals    Frequency    7X/week  PT Plan Current plan remains appropriate    Co-evaluation             End of Session Equipment Utilized During Treatment: Gait belt;Right knee immobilizer Activity Tolerance: Patient limited by pain;Patient limited by fatigue Patient left: in chair;with call bell/phone within reach     Time: [id number] PT Time Calculation (min) (ACUTE ONLY): 26 min  Charges:  $Gait  Training: 8-22 mins $Therapeutic Exercise: 8-22 mins                    G Codes:      Clearnce Sorrel Verne Lanuza 05/18/16, 4:53 PM Sherie Don, [place], DPT [phone]

## 2016-05-05 NOTE — Clinical Social Work Note (Signed)
CSW received insurance authorization: [id number].  Nonnie Done, MSW, LCSW  ([id number]) [id number]  Licensed Clinical Social Worker

## 2016-05-05 NOTE — Clinical Social Work Placement (Signed)
   CLINICAL SOCIAL WORK PLACEMENT  NOTE  Date:  05/05/2016  Patient Details  Name: Robin Christensen MRN: [id number] Date of Birth: 12/07/1950  Clinical Social Work is seeking post-discharge placement for this patient at the [place] level of care (*CSW will initial, date and re-position this form in  chart as items are completed):  Yes   Patient/family provided with [place] Work Department's list of facilities offering this level of care within the geographic area requested by the patient (or if unable, by the patient's family).  Yes   Patient/family informed of their freedom to choose among providers that offer the needed level of care, that participate in Medicare, Medicaid or managed care program needed by the patient, have an available bed and are willing to accept the patient.  Yes   Patient/family informed of 's ownership interest in [facility], as well as of the fact that they are under no obligation to receive care at these facilities.  PASRR submitted to EDS on       PASRR number received on       Existing PASRR number confirmed on       FL2 transmitted to all facilities in geographic area requested by pt/family on       FL2 transmitted to all facilities within larger geographic area on       Patient informed that his/her managed care company has contracts with or will negotiate with certain facilities, including the following:        Yes   Patient/family informed of bed offers received.  Patient chooses bed at [facility]     Physician recommends and patient chooses bed at [facility]    Patient to be transferred to [facility] on 05/05/16.  Patient to be transferred to facility by PTAR     Patient family notified on 05/05/16 of transfer.  Name of family member notified:  patient was notified at bedside      PHYSICIAN Please prepare priority  discharge summary, including medications     Additional Comment:    _______________________________________________ Dulcy Fanny, LCSW 05/05/2016, 10:21 AM

## 2016-05-05 NOTE — Clinical Social Work Note (Addendum)
Patient will likely discharge today pending MD order. Patient will discharge to: [facility] RN to call report prior to transportation to: [phone] Transportation: [facility] to be arranged after MD clearance for dc and once insurance ([place]) authorization received.  CSW sent discharge summary to SNF for review.    Nonnie Done, MSW, LCSW  ([id number]) [id number]  Licensed Clinical Social Worker

## 2016-05-06 DIAGNOSIS — I10 Essential (primary) hypertension: Secondary | ICD-10-CM | POA: Diagnosis not present

## 2016-05-06 DIAGNOSIS — M1990 Unspecified osteoarthritis, unspecified site: Secondary | ICD-10-CM | POA: Diagnosis not present

## 2016-05-06 DIAGNOSIS — I480 Paroxysmal atrial fibrillation: Secondary | ICD-10-CM | POA: Diagnosis not present

## 2016-05-06 DIAGNOSIS — Z96651 Presence of right artificial knee joint: Secondary | ICD-10-CM | POA: Diagnosis not present

## 2016-05-06 DIAGNOSIS — Z5181 Encounter for therapeutic drug level monitoring: Secondary | ICD-10-CM | POA: Diagnosis not present

## 2016-05-06 DIAGNOSIS — E785 Hyperlipidemia, unspecified: Secondary | ICD-10-CM | POA: Diagnosis not present

## 2016-05-06 DIAGNOSIS — M1711 Unilateral primary osteoarthritis, right knee: Secondary | ICD-10-CM | POA: Diagnosis not present

## 2016-05-06 DIAGNOSIS — M6281 Muscle weakness (generalized): Secondary | ICD-10-CM | POA: Diagnosis not present

## 2016-05-06 DIAGNOSIS — E039 Hypothyroidism, unspecified: Secondary | ICD-10-CM | POA: Diagnosis not present

## 2016-05-06 DIAGNOSIS — M25569 Pain in unspecified knee: Secondary | ICD-10-CM | POA: Diagnosis not present

## 2016-05-06 DIAGNOSIS — I4891 Unspecified atrial fibrillation: Secondary | ICD-10-CM | POA: Diagnosis not present

## 2016-05-06 MED ORDER — HYDROCODONE-ACETAMINOPHEN 5-325 MG PO TABS: 1.0000 | ORAL_TABLET | ORAL | 0 refills | Status: DC | PRN

## 2016-05-06 MED ORDER — METHOCARBAMOL 500 MG PO TABS: 500.0000 mg | ORAL_TABLET | Freq: Four times a day (QID) | ORAL | 0 refills | Status: DC | PRN

## 2016-05-06 MED ORDER — ELIQUIS 5 MG PO TABS: 5.0000 mg | ORAL_TABLET | Freq: Two times a day (BID) | ORAL | 0 refills | Status: DC

## 2016-05-06 NOTE — Care Management Important Message (Signed)
Important Message  Patient Details  Name: Robin Christensen MRN: [id number] Date of Birth: [birth year]   Medicare Important Message Given:  Yes    [name] 05/06/2016, 4:01 PM

## 2016-05-06 NOTE — Progress Notes (Signed)
Report called to [facility] at  [facility]

## 2016-05-06 NOTE — Discharge Summary (Signed)
Patient ID: Robin Christensen MRN: [id number] DOB/AGE: 66/30/1952 66 y.o.  Admit date: 05/03/2016 Discharge date: 05/06/2016  Admission Diagnoses:  Principal Problem:   Primary localized osteoarthritis of right knee Active Problems:   Primary osteoarthritis of right knee   Discharge Diagnoses:  Same  Past Medical History:  Diagnosis Date  . Cancer ([place])   . Dysrhythmia    AF  . Hyperlipidemia   . Hypertension   . Obesity   . PONV (postoperative nausea and vomiting)   . Sleep apnea    cpcap    Surgeries: Procedure(s): TOTAL KNEE ARTHROPLASTY on 05/03/2016   Consultants:   Discharged Condition: Improved  Hospital Course: Robin Christensen is an 65 y.o. female who was admitted 05/03/2016 for operative treatment ofPrimary localized osteoarthritis of right knee. Patient has severe unremitting pain that affects sleep, daily activities, and work/hobbies. After pre-op clearance the patient was taken to the operating room on 05/03/2016 and underwent  Procedure(s): TOTAL KNEE ARTHROPLASTY.    Patient was given perioperative antibiotics: Anti-infectives    Start     Dose/Rate Route Frequency Ordered Stop   05/03/16 1930  ceFAZolin (ANCEF) IVPB 2g/100 mL premix     2 g 200 mL/hr over 30 Minutes Intravenous Every 6 hours 05/03/16 1825 05/04/16 0253   05/03/16 0529  ceFAZolin (ANCEF) IVPB 2g/100 mL premix     2 g 200 mL/hr over 30 Minutes Intravenous On call to O.R. 05/03/16 0529 05/03/16 0750       Patient was given sequential compression devices, early ambulation, and chemoprophylaxis to prevent DVT.  Patient benefited maximally from hospital stay and there were no complications.    Recent vital signs: Patient Vitals for the past 24 hrs:  BP Temp Temp src Pulse Resp SpO2  05/06/16 0442 (!) 118/37 98.8 F (37.1 C) Oral (!) 58 16 99 %  05/05/16 1956 (!) 100/50 98.8 F (37.1 C) Oral 60 16 100 %     Recent laboratory studies: No results for input(s): WBC, HGB, HCT,  PLT, NA, K, CL, CO2, BUN, CREATININE, GLUCOSE, INR, CALCIUM in the last 72 hours.  Invalid input(s): PT, 2   Discharge Medications:     Medication List    TAKE these medications   acetaminophen 500 MG tablet Commonly known as:  TYLENOL Take 500 mg by mouth 3 (three) times daily as needed for mild pain or headache.   ELIQUIS 5 MG Tabs tablet Generic drug:  apixaban Take 1 tablet (5 mg total) by mouth 2 (two) times daily.   famotidine 10 MG tablet Commonly known as:  PEPCID Take 10 mg by mouth daily as needed for heartburn or indigestion.   furosemide 40 MG tablet Commonly known as:  LASIX Take 40 mg by mouth daily.   HYDROcodone-acetaminophen 5-325 MG tablet Commonly known as:  NORCO/VICODIN Take 1-2 tablets by mouth every 4 (four) hours as needed (breakthrough pain).   levothyroxine 75 MCG tablet Commonly known as:  SYNTHROID, LEVOTHROID Take 75 mcg by mouth daily before breakfast.   methocarbamol 500 MG tablet Commonly known as:  ROBAXIN Take 1 tablet (500 mg total) by mouth every 6 (six) hours as needed for muscle spasms.   metoprolol succinate 25 MG 24 hr tablet Commonly known as:  TOPROL-XL Take 25 mg by mouth at bedtime.   pravastatin 40 MG tablet Commonly known as:  PRAVACHOL Take 40 mg by mouth daily.       Diagnostic Studies: Dg Chest 2 View  Result Date: 04/22/2016 CLINICAL  DATA:  Preop for knee surgery. EXAM: CHEST  2 VIEW COMPARISON:  None. FINDINGS: There is no focal parenchymal opacity. There is no pleural effusion or pneumothorax. The heart and mediastinal contours are unremarkable. Multiple surgical clips in the right axilla and right inferior chest wall and upper abdomen. The osseous structures are unremarkable. IMPRESSION: No active cardiopulmonary disease. Electronically Signed   By: Kathreen Devoid   On: 04/22/2016 09:16    Disposition: 01-Home or Self Care  Discharge Instructions    Call MD / Call 911    Complete by:  As directed    If you  experience chest pain or shortness of breath, CALL 911 and be transported to the hospital emergency room.  If you develope a fever above 101 F, pus (white drainage) or increased drainage or redness at the wound, or calf pain, call your surgeon's office.   Constipation Prevention    Complete by:  As directed    Drink plenty of fluids.  Prune juice may be helpful.  You may use a stool softener, such as Colace (over the counter) 100 mg twice a day.  Use MiraLax (over the counter) for constipation as needed.   Diet - low sodium heart healthy    Complete by:  As directed    Discharge instructions    Complete by:  As directed    INSTRUCTIONS AFTER JOINT REPLACEMENT   Remove items at home which could result in a fall. This includes throw rugs or furniture in walking pathways ICE to the affected joint every three hours while awake for 30 minutes at a time, for at least the first 3-5 days, and then as needed for pain and swelling.  Continue to use ice for pain and swelling. You may notice swelling that will progress down to the foot and ankle.  This is normal after surgery.  Elevate your leg when you are not up walking on it.   Continue to use the breathing machine you got in the hospital (incentive spirometer) which will help keep your temperature down.  It is common for your temperature to cycle up and down following surgery, especially at night when you are not up moving around and exerting yourself.  The breathing machine keeps your lungs expanded and your temperature down.   DIET:  As you were doing prior to hospitalization, we recommend a well-balanced diet.  DRESSING / WOUND CARE / SHOWERING  You may shower 3 days after surgery, but keep the wounds dry during showering.  You may use an occlusive plastic wrap (Press'n Seal for example), NO SOAKING/SUBMERGING IN THE BATHTUB.  If the bandage gets wet, change with a clean dry gauze.  If the incision gets wet, pat the wound dry with a clean  towel.  ACTIVITY  Increase activity slowly as tolerated, but follow the weight bearing instructions below.   No driving for 6 weeks or until further direction given by your physician.  You cannot drive while taking narcotics.  No lifting or carrying greater than 10 lbs. until further directed by your surgeon. Avoid periods of inactivity such as sitting longer than an hour when not asleep. This helps prevent blood clots.  You may return to work once you are authorized by your doctor.     WEIGHT BEARING   Weight bearing as tolerated with assist device (walker, cane, etc) as directed, use it as long as suggested by your surgeon or therapist, typically at least 4-6 weeks.   EXERCISES  Results after  joint replacement surgery are often greatly improved when you follow the exercise, range of motion and muscle strengthening exercises prescribed by your doctor. Safety measures are also important to protect the joint from further injury. Any time any of these exercises cause you to have increased pain or swelling, decrease what you are doing until you are comfortable again and then slowly increase them. If you have problems or questions, call your caregiver or physical therapist for advice.   Rehabilitation is important following a joint replacement. After just a few days of immobilization, the muscles of the leg can become weakened and shrink (atrophy).  These exercises are designed to build up the tone and strength of the thigh and leg muscles and to improve motion. Often times heat used for twenty to thirty minutes before working out will loosen up your tissues and help with improving the range of motion but do not use heat for the first two weeks following surgery (sometimes heat can increase post-operative swelling).   These exercises can be done on a training (exercise) mat, on the floor, on a table or on a bed. Use whatever works the best and is most comfortable for you.    Use music or  television while you are exercising so that the exercises are a pleasant break in your day. This will make your life better with the exercises acting as a break in your routine that you can look forward to.   Perform all exercises about fifteen times, three times per day or as directed.  You should exercise both the operative leg and the other leg as well.   Exercises include:   Quad Sets - Tighten up the muscle on the front of the thigh (Quad) and hold for 5-10 seconds.   Straight Leg Raises - With your knee straight (if you were given a brace, keep it on), lift the leg to 60 degrees, hold for 3 seconds, and slowly lower the leg.  Perform this exercise against resistance later as your leg gets stronger.  Leg Slides: Lying on your back, slowly slide your foot toward your buttocks, bending your knee up off the floor (only go as far as is comfortable). Then slowly slide your foot back down until your leg is flat on the floor again.  Angel Wings: Lying on your back spread your legs to the side as far apart as you can without causing discomfort.  Hamstring Strength:  Lying on your back, push your heel against the floor with your leg straight by tightening up the muscles of your buttocks.  Repeat, but this time bend your knee to a comfortable angle, and push your heel against the floor.  You may put a pillow under the heel to make it more comfortable if necessary.   A rehabilitation program following joint replacement surgery can speed recovery and prevent re-injury in the future due to weakened muscles. Contact your doctor or a physical therapist for more information on knee rehabilitation.    CONSTIPATION  Constipation is defined medically as fewer than three stools per week and severe constipation as less than one stool per week.  Even if you have a regular bowel pattern at home, your normal regimen is likely to be disrupted due to multiple reasons following surgery.  Combination of anesthesia,  postoperative narcotics, change in appetite and fluid intake all can affect your bowels.   YOU MUST use at least one of the following options; they are listed in order of increasing strength to  get the job done.  They are all available over the counter, and you may need to use some, POSSIBLY even all of these options:    Drink plenty of fluids (prune juice may be helpful) and high fiber foods Colace 100 mg by mouth twice a day  Senokot for constipation as directed and as needed Dulcolax (bisacodyl), take with full glass of water  Miralax (polyethylene glycol) once or twice a day as needed.  If you have tried all these things and are unable to have a bowel movement in the first 3-4 days after surgery call either your surgeon or your primary doctor.    If you experience loose stools or diarrhea, hold the medications until you stool forms back up.  If your symptoms do not get better within 1 week or if they get worse, check with your doctor.  If you experience "the worst abdominal pain ever" or develop nausea or vomiting, please contact the office immediately for further recommendations for treatment.   ITCHING:  If you experience itching with your medications, try taking only a single pain pill, or even half a pain pill at a time.  You can also use Benadryl over the counter for itching or also to help with sleep.   TED HOSE STOCKINGS:  Use stockings on both legs until for at least 2 weeks or as directed by physician office. They may be removed at night for sleeping.  MEDICATIONS:  See your medication summary on the "After Visit Summary" that nursing will review with you.  You may have some home medications which will be placed on hold until you complete the course of blood thinner medication.  It is important for you to complete the blood thinner medication as prescribed.  PRECAUTIONS:  If you experience chest pain or shortness of breath - call 911 immediately for transfer to the hospital emergency  department.   If you develop a fever greater that 101 F, purulent drainage from wound, increased redness or drainage from wound, foul odor from the wound/dressing, or calf pain - CONTACT YOUR SURGEON.                                                   FOLLOW-UP APPOINTMENTS:  If you do not already have a post-op appointment, please call the office for an appointment to be seen by your surgeon.  Guidelines for how soon to be seen are listed in your "After Visit Summary", but are typically between 1-4 weeks after surgery.  OTHER INSTRUCTIONS:   Knee Replacement:  Do not place pillow under knee, focus on keeping the knee straight while resting. CPM instructions: 0-90 degrees, 2 hours in the morning, 2 hours in the afternoon, and 2 hours in the evening. Place foam block, curve side up under heel at all times except when in CPM or when walking.  DO NOT modify, tear, cut, or change the foam block in any way.  MAKE SURE YOU:  Understand these instructions.  Get help right away if you are not doing well or get worse.    Thank you for letting us be a part of your medical care team.  It is a privilege we respect greatly.  We hope these instructions will help you stay on track for a fast and full recovery!   Increase activity slowly as tolerated  Complete by:  As directed       Follow-up Information    DALLDORF,PETER G, MD. Schedule an appointment as soon as possible for a visit in 2 weeks.   Specialty:  Orthopedic Surgery Contact information: [address] [place] [address] [phone]            Signed: Rich Fuchs 05/06/2016, 7:53 AM

## 2016-05-06 NOTE — Progress Notes (Signed)
Occupational Therapy Treatment Patient Details Name: Robin Christensen MRN: [id number] DOB: 1951/04/15 Today's Date: 05/06/2016    History of present illness Pt is a 65 y/o female s/p R TKA. PMH including but not limited to HTN and obesity.   OT comments  Pt making progress towards goals. Today's session focused on education and practicing use of AE for dressing in preparation for d/c to SNF. Pt pleasant and willing to work with therapy.  Follow Up Recommendations  SNF    Equipment Recommendations  3 in 1 bedside comode;Other (comment) (to be finalized by next venue of care)    Recommendations for Other Services      Precautions / Restrictions Precautions Precautions: Fall;Knee Precaution Booklet Issued: Yes (comment) Precaution Comments: Pt able to  Required Braces or Orthoses: Knee Immobilizer - Right Knee Immobilizer - Right: On when out of bed or walking Restrictions Weight Bearing Restrictions: Yes RLE Weight Bearing: Weight bearing as tolerated       Mobility Bed Mobility               General bed mobility comments: pt sitting OOB in recliner when OT entered room  Transfers Overall transfer level: Needs assistance Equipment used: Rolling walker (2 wheeled) Transfers: Sit to/from Stand Sit to Stand: Min guard         General transfer comment: Pt required increased time, good hand placement    Balance Overall balance assessment: Needs assistance Sitting-balance support: No upper extremity supported;Feet supported Sitting balance-Leahy Scale: Good                             ADL                   Upper Body Dressing : Sitting;Set up (able to don sweater)   Lower Body Dressing: Minimal assistance;Cueing for sequencing;Sit to/from stand;With adaptive equipment (Able to use grabber and practice LB dressing)                        Vision                     Perception     Praxis      Cognition   Behavior  During Therapy: [facility] for tasks assessed/performed Overall Cognitive Status: Within Functional Limits for tasks assessed                       Extremity/Trunk Assessment               Exercises     Shoulder Instructions       General Comments      Pertinent Vitals/ Pain       Pain Assessment: 0-10 Pain Score: 5  Faces Pain Scale: Hurts a little bit Pain Location: R knee Pain Descriptors / Indicators: Sore;Grimacing Pain Intervention(s): Limited activity within patient's tolerance;Monitored during session;Ice applied  Home Living                                          Prior Functioning/Environment              Frequency  Min 2X/week        Progress Toward Goals  OT Goals(current goals can now be found in the care plan section)  Progress  towards OT goals: Progressing toward goals  Acute Rehab OT Goals Patient Stated Goal: To get better so she can see her dog Toney Reil OT Goal Formulation: With patient Time For Goal Achievement: 05/12/16 Potential to Achieve Goals: Good  Plan Discharge plan remains appropriate    Co-evaluation                 End of Session Equipment Utilized During Treatment: Rolling walker;Other (comment);Right knee immobilizer (AE - grabber/reacher)   Activity Tolerance Patient tolerated treatment well   Patient Left in chair;with call bell/phone within reach   Nurse Communication Mobility status        Time: [id number] OT Time Calculation (min): 17 min  Charges: OT General Charges $OT Visit: 1 Procedure OT Treatments $Self Care/Home Management : 8-22 mins  Merri Ray Avira Tillison 05/06/2016, 12:32 PM  Hulda Humphrey OTR/L [phone]

## 2016-05-12 DIAGNOSIS — E039 Hypothyroidism, unspecified: Secondary | ICD-10-CM | POA: Diagnosis not present

## 2016-05-12 DIAGNOSIS — M1990 Unspecified osteoarthritis, unspecified site: Secondary | ICD-10-CM | POA: Diagnosis not present

## 2016-05-12 DIAGNOSIS — Z96651 Presence of right artificial knee joint: Secondary | ICD-10-CM | POA: Diagnosis not present

## 2016-05-12 DIAGNOSIS — I4891 Unspecified atrial fibrillation: Secondary | ICD-10-CM | POA: Diagnosis not present

## 2016-05-13 DIAGNOSIS — M1711 Unilateral primary osteoarthritis, right knee: Secondary | ICD-10-CM | POA: Diagnosis not present

## 2016-05-18 DIAGNOSIS — M6281 Muscle weakness (generalized): Secondary | ICD-10-CM | POA: Diagnosis not present

## 2016-05-18 DIAGNOSIS — Z4789 Encounter for other orthopedic aftercare: Secondary | ICD-10-CM | POA: Diagnosis not present

## 2016-05-18 DIAGNOSIS — G4733 Obstructive sleep apnea (adult) (pediatric): Secondary | ICD-10-CM | POA: Diagnosis not present

## 2016-05-19 DIAGNOSIS — I4891 Unspecified atrial fibrillation: Secondary | ICD-10-CM | POA: Diagnosis not present

## 2016-05-19 DIAGNOSIS — Z96651 Presence of right artificial knee joint: Secondary | ICD-10-CM | POA: Diagnosis not present

## 2016-05-19 DIAGNOSIS — I10 Essential (primary) hypertension: Secondary | ICD-10-CM | POA: Diagnosis not present

## 2016-05-19 DIAGNOSIS — E785 Hyperlipidemia, unspecified: Secondary | ICD-10-CM | POA: Diagnosis not present

## 2016-05-19 DIAGNOSIS — Z471 Aftercare following joint replacement surgery: Secondary | ICD-10-CM | POA: Diagnosis not present

## 2016-05-19 DIAGNOSIS — E039 Hypothyroidism, unspecified: Secondary | ICD-10-CM | POA: Diagnosis not present

## 2016-05-20 DIAGNOSIS — I4891 Unspecified atrial fibrillation: Secondary | ICD-10-CM | POA: Diagnosis not present

## 2016-05-20 DIAGNOSIS — E039 Hypothyroidism, unspecified: Secondary | ICD-10-CM | POA: Diagnosis not present

## 2016-05-20 DIAGNOSIS — I10 Essential (primary) hypertension: Secondary | ICD-10-CM | POA: Diagnosis not present

## 2016-05-20 DIAGNOSIS — E785 Hyperlipidemia, unspecified: Secondary | ICD-10-CM | POA: Diagnosis not present

## 2016-05-20 DIAGNOSIS — Z471 Aftercare following joint replacement surgery: Secondary | ICD-10-CM | POA: Diagnosis not present

## 2016-05-23 DIAGNOSIS — I4891 Unspecified atrial fibrillation: Secondary | ICD-10-CM | POA: Diagnosis not present

## 2016-05-23 DIAGNOSIS — I10 Essential (primary) hypertension: Secondary | ICD-10-CM | POA: Diagnosis not present

## 2016-05-23 DIAGNOSIS — E785 Hyperlipidemia, unspecified: Secondary | ICD-10-CM | POA: Diagnosis not present

## 2016-05-23 DIAGNOSIS — E039 Hypothyroidism, unspecified: Secondary | ICD-10-CM | POA: Diagnosis not present

## 2016-05-23 DIAGNOSIS — Z471 Aftercare following joint replacement surgery: Secondary | ICD-10-CM | POA: Diagnosis not present

## 2016-05-24 DIAGNOSIS — I4891 Unspecified atrial fibrillation: Secondary | ICD-10-CM | POA: Diagnosis not present

## 2016-05-24 DIAGNOSIS — I10 Essential (primary) hypertension: Secondary | ICD-10-CM | POA: Diagnosis not present

## 2016-05-24 DIAGNOSIS — Z09 Encounter for follow-up examination after completed treatment for conditions other than malignant neoplasm: Secondary | ICD-10-CM | POA: Diagnosis not present

## 2016-05-24 DIAGNOSIS — E039 Hypothyroidism, unspecified: Secondary | ICD-10-CM | POA: Diagnosis not present

## 2016-05-25 DIAGNOSIS — Z471 Aftercare following joint replacement surgery: Secondary | ICD-10-CM | POA: Diagnosis not present

## 2016-05-25 DIAGNOSIS — E785 Hyperlipidemia, unspecified: Secondary | ICD-10-CM | POA: Diagnosis not present

## 2016-05-25 DIAGNOSIS — I10 Essential (primary) hypertension: Secondary | ICD-10-CM | POA: Diagnosis not present

## 2016-05-25 DIAGNOSIS — E039 Hypothyroidism, unspecified: Secondary | ICD-10-CM | POA: Diagnosis not present

## 2016-05-25 DIAGNOSIS — I4891 Unspecified atrial fibrillation: Secondary | ICD-10-CM | POA: Diagnosis not present

## 2016-05-26 DIAGNOSIS — E039 Hypothyroidism, unspecified: Secondary | ICD-10-CM | POA: Diagnosis not present

## 2016-05-26 DIAGNOSIS — I4891 Unspecified atrial fibrillation: Secondary | ICD-10-CM | POA: Diagnosis not present

## 2016-05-26 DIAGNOSIS — E785 Hyperlipidemia, unspecified: Secondary | ICD-10-CM | POA: Diagnosis not present

## 2016-05-26 DIAGNOSIS — I10 Essential (primary) hypertension: Secondary | ICD-10-CM | POA: Diagnosis not present

## 2016-05-26 DIAGNOSIS — Z471 Aftercare following joint replacement surgery: Secondary | ICD-10-CM | POA: Diagnosis not present

## 2016-05-27 DIAGNOSIS — M1711 Unilateral primary osteoarthritis, right knee: Secondary | ICD-10-CM | POA: Diagnosis not present

## 2016-05-27 DIAGNOSIS — E785 Hyperlipidemia, unspecified: Secondary | ICD-10-CM | POA: Diagnosis not present

## 2016-05-27 DIAGNOSIS — I10 Essential (primary) hypertension: Secondary | ICD-10-CM | POA: Diagnosis not present

## 2016-05-27 DIAGNOSIS — Z471 Aftercare following joint replacement surgery: Secondary | ICD-10-CM | POA: Diagnosis not present

## 2016-05-27 DIAGNOSIS — I4891 Unspecified atrial fibrillation: Secondary | ICD-10-CM | POA: Diagnosis not present

## 2016-05-27 DIAGNOSIS — E039 Hypothyroidism, unspecified: Secondary | ICD-10-CM | POA: Diagnosis not present

## 2016-05-30 DIAGNOSIS — Z471 Aftercare following joint replacement surgery: Secondary | ICD-10-CM | POA: Diagnosis not present

## 2016-05-30 DIAGNOSIS — I4891 Unspecified atrial fibrillation: Secondary | ICD-10-CM | POA: Diagnosis not present

## 2016-05-30 DIAGNOSIS — I10 Essential (primary) hypertension: Secondary | ICD-10-CM | POA: Diagnosis not present

## 2016-05-30 DIAGNOSIS — E785 Hyperlipidemia, unspecified: Secondary | ICD-10-CM | POA: Diagnosis not present

## 2016-05-30 DIAGNOSIS — E039 Hypothyroidism, unspecified: Secondary | ICD-10-CM | POA: Diagnosis not present

## 2016-05-31 DIAGNOSIS — I4891 Unspecified atrial fibrillation: Secondary | ICD-10-CM | POA: Diagnosis not present

## 2016-05-31 DIAGNOSIS — E039 Hypothyroidism, unspecified: Secondary | ICD-10-CM | POA: Diagnosis not present

## 2016-05-31 DIAGNOSIS — E785 Hyperlipidemia, unspecified: Secondary | ICD-10-CM | POA: Diagnosis not present

## 2016-05-31 DIAGNOSIS — I10 Essential (primary) hypertension: Secondary | ICD-10-CM | POA: Diagnosis not present

## 2016-05-31 DIAGNOSIS — Z471 Aftercare following joint replacement surgery: Secondary | ICD-10-CM | POA: Diagnosis not present

## 2016-06-01 DIAGNOSIS — I10 Essential (primary) hypertension: Secondary | ICD-10-CM | POA: Diagnosis not present

## 2016-06-01 DIAGNOSIS — E039 Hypothyroidism, unspecified: Secondary | ICD-10-CM | POA: Diagnosis not present

## 2016-06-01 DIAGNOSIS — I4891 Unspecified atrial fibrillation: Secondary | ICD-10-CM | POA: Diagnosis not present

## 2016-06-01 DIAGNOSIS — Z471 Aftercare following joint replacement surgery: Secondary | ICD-10-CM | POA: Diagnosis not present

## 2016-06-01 DIAGNOSIS — E785 Hyperlipidemia, unspecified: Secondary | ICD-10-CM | POA: Diagnosis not present

## 2016-06-03 DIAGNOSIS — Z471 Aftercare following joint replacement surgery: Secondary | ICD-10-CM | POA: Diagnosis not present

## 2016-06-03 DIAGNOSIS — I4891 Unspecified atrial fibrillation: Secondary | ICD-10-CM | POA: Diagnosis not present

## 2016-06-03 DIAGNOSIS — E785 Hyperlipidemia, unspecified: Secondary | ICD-10-CM | POA: Diagnosis not present

## 2016-06-03 DIAGNOSIS — I10 Essential (primary) hypertension: Secondary | ICD-10-CM | POA: Diagnosis not present

## 2016-06-03 DIAGNOSIS — E039 Hypothyroidism, unspecified: Secondary | ICD-10-CM | POA: Diagnosis not present

## 2016-06-07 ENCOUNTER — Ambulatory Visit (INDEPENDENT_AMBULATORY_CARE_PROVIDER_SITE_OTHER): Payer: Commercial Managed Care - HMO | Admitting: Physical Therapy

## 2016-06-07 ENCOUNTER — Encounter: Payer: Self-pay | Admitting: Physical Therapy

## 2016-06-07 DIAGNOSIS — R2689 Other abnormalities of gait and mobility: Secondary | ICD-10-CM | POA: Diagnosis not present

## 2016-06-07 DIAGNOSIS — M7989 Other specified soft tissue disorders: Secondary | ICD-10-CM

## 2016-06-07 DIAGNOSIS — M6281 Muscle weakness (generalized): Secondary | ICD-10-CM | POA: Diagnosis not present

## 2016-06-07 DIAGNOSIS — M25661 Stiffness of right knee, not elsewhere classified: Secondary | ICD-10-CM | POA: Diagnosis not present

## 2016-06-07 DIAGNOSIS — M79661 Pain in right lower leg: Secondary | ICD-10-CM

## 2016-06-07 NOTE — Patient Instructions (Addendum)
WALKING  Walking is a great form of exercise to increase your strength, endurance and overall fitness.  A walking program can help you start slowly and gradually build endurance as you go.  Everyone's ability is different, so each person's starting point will be different.  You do not have to follow them exactly.  The are just samples. You should simply find out what's right for you and stick to that program.   In the beginning, you'll start off walking 2-3 times a day for short distances.  As you get stronger, you'll be walking further at just 1-2 times per day.  A. You Can Walk For A Certain Length Of Time Each Day    Walk 5 minutes 3 times per day.  Increase 2 minutes every 2 days (3 times per day).  Work up to 25-30 minutes (1-2 times per day).   Example:   Day 1-2 5 minutes 3 times per day   Day 7-8 12 minutes 2-3 times per day   Day 13-14 25 minutes 1-2 times per day  B. You Can Walk For a Certain Distance Each Day     Distance can be substituted for time.    Example:   3 trips to mailbox (at road)   3 trips to corner of block   3 trips around the block  C. Go to local [employer] and use the track.    Walk for distance ____ around track  Or time ____ minutes  D. Walk ____ Jog ____ Run ___  Please only do the exercises that your therapist has initialed and dated  Balance: Unilateral - do next to a counter for safety    Attempt to balance on right leg. Hold __as long as able__ seconds. Repeat _5-10___ times per set. Do _1___ sets per session. Do __1-2__ sessions per day.  Heel Raise: Bilateral (Standing) - do next to a counter for safety.     Rise on balls of feet. Repeat __10__ times per set. Do __2-3__ sets per session. Do _1-2___ sessions per day.  Bridging    Slowly raise buttocks from floor, keeping stomach tight. Repeat __10__ times per set. Do __2-3__ sets per session. Do _1-2___ sessions per day.  Strengthening: Hip Abduction  (Side-Lying)    Tighten muscles on front of right thigh, then lift leg up and slightly back__15-20__ inches from surface, keeping knee locked.  Repeat __10__ times per set. Do __2-3__ sets per session. Do _1-2___ sessions per day.   Self-Mobilization: Knee Flexion / Extension (Sitting)    Gently pull right leg back, push with other leg until a stretch for more of a stretch, or scoot your bottom forward. Hold __10__ seconds. Relax. RRepeat ____ times per set. Do __10_ sets per session. Do __1-2__ sessions per day.  Gastroc Stretch    Stand with right foot back, leg straight, forward leg bent. Keeping heel on floor, turned slightly out, lean into wall until stretch is felt in calf. Hold __30__ seconds. Repeat _1___ times per set. Do __1__ sets per session. Do _1-2___ sessions per day. Copyright  [employer]. All rights reserved.

## 2016-06-07 NOTE — Therapy (Signed)
[place], [place], [address] Phone: [phone]   Fax:  [phone]  Physical Therapy Evaluation  Patient Details  Name: Robin Christensen MRN: [id number] Date of Birth: 18-Dec-1950 Referring Provider: Dr Donavan Burnet  Encounter Date: 06/07/2016      PT End of Session - 06/07/16 0959    Visit Number 1   Number of Visits 12   Date for PT Re-Evaluation 07/19/16   PT Start Time 0932   PT Stop Time 1022   PT Time Calculation (min) 50 min   Activity Tolerance Patient tolerated treatment well      Past Medical History:  Diagnosis Date  . Cancer ([place])   . Dysrhythmia    AF  . Hyperlipidemia   . Hypertension   . Obesity   . PONV (postoperative nausea and vomiting)   . Sleep apnea    cpcap    Past Surgical History:  Procedure Laterality Date  . BREAST SURGERY    . CARDIOVERSION N/A 06/26/2014   Procedure: CARDIOVERSION;  Surgeon: Laverda Page, MD;  Location: [facility];  Service: Cardiovascular;  Laterality: N/A;  . CHOLECYSTECTOMY    . TOTAL KNEE ARTHROPLASTY Right 05/03/2016   Procedure: TOTAL KNEE ARTHROPLASTY;  Surgeon: Melrose Nakayama, MD;  Location: [facility];  Service: Orthopedics;  Laterality: Right;    There were no vitals filed for this visit.       Subjective Assessment - 06/07/16 0927    Subjective Pt has her Rt knee replaced 05/03/16, Had 10 days of post acute rehab, then home with HHPT.  She presents today without an assistive device. She reports she is doing better however still has issues.    How long can you sit comfortably? 30 min   How long can you walk comfortably? ~ 2 blocks, limited by pain and fatigue   Patient Stated Goals improve her stamina, get as much motion as she can in her knee.  Walk 2 miles for exercise.    Currently in Pain? Yes   Pain Score 3    Pain Location Knee   Pain Orientation Right   Pain Descriptors / Indicators Aching   Pain Type  Surgical pain   Pain Onset More than a month ago   Pain Frequency Intermittent   Aggravating Factors  going to bed, side sleeper   Pain Relieving Factors recliner with leg elevated, does some icing.             [facility] PT Assessment - 06/07/16 0001      Assessment   Medical Diagnosis Rt TKR   Referring Provider Dr Donavan Burnet   Onset Date/Surgical Date 05/03/16   Hand Dominance Right   Next MD Visit 06/23/06   Prior Therapy HHPT     Precautions   Precautions None     Restrictions   Weight Bearing Restrictions --  WBAT     Balance Screen   Has the patient fallen in the past 6 months No     [place] residence   [place] Two level  takes one step at a time     Prior Function   Level of [place] Retired   Leisure walking for exercise, read, go out with friends, walk dog.      Observation/Other Assessments   Focus on Therapeutic Outcomes (FOTO)  50% limited     Observation/Other Assessments-Edema  Edema --  (+) in Rt knee joint     Functional Tests   Functional tests Squat;Single leg stance     Squat   Comments shifts to Lt      Single Leg Stance   Comments Lt > 12 sec, Rt 2 sec.      ROM / Strength   AROM / PROM / Strength AROM;PROM;Strength     AROM   AROM Assessment Site Knee   Right/Left Knee Right;Left   Right Knee Extension 107   Right Knee Flexion -6   Left Knee Extension 0   Left Knee Flexion 125     PROM   PROM Assessment Site Knee   Right/Left Knee Right   Right Knee Extension 63   Right Knee Flexion 114     Strength   Strength Assessment Site Hip;Knee;Ankle   Right/Left Hip Right   Right Hip Flexion 4+/5   Right Hip Extension 4/5   Right Hip ABduction 4-/5   Right/Left Knee Right  Lt WNL   Right Knee Flexion 4+/5   Right Knee Extension 4-/5   Right/Left Ankle --  Lt WNL, Rt WNL except eversion 4/5     Flexibility   Soft Tissue  Assessment /Muscle Length yes   Hamstrings supine SLR > 85 degrees   Quadriceps prone knee flex Rt 90, Lt 115     Palpation   Patella mobility Rt hypomobile.    Palpation comment decreased scar mobility mid and distal.      Ambulation/Gait   Gait Pattern Decreased stance time - right;Decreased hip/knee flexion - right;Antalgic                   [facility] Adult PT Treatment/Exercise - 06/07/16 0001      Exercises   Exercises Knee/Hip     Knee/Hip Exercises: Stretches   Gastroc Stretch Both;30 seconds     Knee/Hip Exercises: Aerobic   Other Aerobic 5' walking around gym, working on heel toe pattern     Knee/Hip Exercises: Standing   Heel Raises Both;10 reps     Knee/Hip Exercises: Seated   Heel Slides AROM;Right;10 reps     Knee/Hip Exercises: Supine   Bridges Both;10 reps     Knee/Hip Exercises: Sidelying   Hip ABduction Right;Strengthening;10 reps  VC for form     Modalities   Modalities Passenger transport manager Location Rt knee   Electrical Stimulation Action ion repelling   Electrical Stimulation Parameters to tolerance   Electrical Stimulation Goals Edema     Vasopneumatic   Number Minutes Vasopneumatic  15 minutes   Vasopnuematic Location  Knee  Rt   Vasopneumatic Pressure Low   Vasopneumatic Temperature  3*                PT Education - 06/07/16 1009    Education provided Yes   Education Details HEP   Person(s) Educated Patient   Methods Explanation;Demonstration;Handout   Comprehension Returned demonstration;Verbalized understanding             PT Long Term Goals - 06/07/16 1010      PT LONG TERM GOAL #1   Title I with advanced HEP to include a walking program (07/19/16)    Time 6   Period Weeks   Status New     PT LONG TERM GOAL #2   Title ambulate without any gait deviations on even and uneven surfaces ( 07/19/16)  Time 6   Period Weeks   Status New      PT LONG TERM GOAL #3   Title demo Rt knee ROM -2 extension to 120 degrees flexion to assist with alternating steps ( 07/19/16)    Time 6   Period Weeks   Status New     PT LONG TERM GOAL #4   Title demo Rt LE strength =/ 5-/5 ( 07/19/16)    Time 6   Period Weeks   Status New     PT LONG TERM GOAL #5   Title improve FOTO =/< 36% limited, CJ level ( 07/19/16)    Time 6   Period Weeks   Status New     Additional Long Term Goals   Additional Long Term Goals Yes     PT LONG TERM GOAL #6   Title report minimal to no pain in Rt knee to allow her to sleep per her previous level ( 07/19/16)    Time 6   Period Weeks   Status New               Plan - 06/07/16 1301    Clinical Impression Statement 65 yo female ~ 5 wks s/p Rt TKA. Presents with antalgic gait, weakness, stiffness, pain and edema.  She has done well so far with post surgery rehab.     Rehab Potential Excellent   PT Frequency 2x / week   PT Duration 6 weeks   PT Treatment/Interventions Moist Heat;Ultrasound;Therapeutic exercise;Dry needling;Taping;Vasopneumatic Device;Manual techniques;Neuromuscular re-education;Cryotherapy;Electrical Stimulation;Patient/family education   PT Next Visit Plan progress knee ROM, LE ther ex and proprioception, modalities PRN   Consulted and Agree with Plan of Care Patient      Patient will benefit from skilled therapeutic intervention in order to improve the following deficits and impairments:  Decreased range of motion, Difficulty walking, Pain, Increased edema, Decreased strength, Decreased mobility  Visit Diagnosis: Stiffness of right knee, not elsewhere classified - Plan: PT plan of care cert/re-cert  Pain and swelling of right lower leg - Plan: PT plan of care cert/re-cert  Other abnormalities of gait and mobility - Plan: PT plan of care cert/re-cert  Muscle weakness (generalized) - Plan: PT plan of care cert/re-cert     Problem List Patient Active Problem List   Diagnosis  Date Noted  . Primary localized osteoarthritis of right knee 05/03/2016  . Primary osteoarthritis of right knee 05/03/2016    Jeral Pinch PT  06/07/2016, 1:14 PM  [facility] [place], [place], [address] Phone: [phone]   Fax:  [phone]  Name: Robin Christensen MRN: [id number] Date of Birth: [birth year]

## 2016-06-13 ENCOUNTER — Ambulatory Visit (INDEPENDENT_AMBULATORY_CARE_PROVIDER_SITE_OTHER): Payer: Commercial Managed Care - HMO | Admitting: Physical Therapy

## 2016-06-13 DIAGNOSIS — M25661 Stiffness of right knee, not elsewhere classified: Secondary | ICD-10-CM

## 2016-06-13 DIAGNOSIS — M6281 Muscle weakness (generalized): Secondary | ICD-10-CM | POA: Diagnosis not present

## 2016-06-13 DIAGNOSIS — R2689 Other abnormalities of gait and mobility: Secondary | ICD-10-CM | POA: Diagnosis not present

## 2016-06-13 DIAGNOSIS — M79661 Pain in right lower leg: Secondary | ICD-10-CM | POA: Diagnosis not present

## 2016-06-13 DIAGNOSIS — M7989 Other specified soft tissue disorders: Secondary | ICD-10-CM

## 2016-06-13 NOTE — Therapy (Signed)
[place], [place], [address] Phone: [phone]   Fax:  [phone]  Physical Therapy Treatment  Patient Details  Name: Robin Christensen MRN: [id number] Date of Birth: 04-Mar-1951 Referring Provider: Dr. Melrose Nakayama  Encounter Date: 06/13/2016      PT End of Session - 06/13/16 1145    Visit Number 2   Number of Visits 12   Date for PT Re-Evaluation 07/19/16   PT Start Time 1100   PT Stop Time 1159   PT Time Calculation (min) 59 min   Activity Tolerance Patient tolerated treatment well      Past Medical History:  Diagnosis Date  . Cancer ([place])   . Dysrhythmia    AF  . Hyperlipidemia   . Hypertension   . Obesity   . PONV (postoperative nausea and vomiting)   . Sleep apnea    cpcap    Past Surgical History:  Procedure Laterality Date  . BREAST SURGERY    . CARDIOVERSION N/A 06/26/2014   Procedure: CARDIOVERSION;  Surgeon: Laverda Page, MD;  Location: [facility];  Service: Cardiovascular;  Laterality: N/A;  . CHOLECYSTECTOMY    . TOTAL KNEE ARTHROPLASTY Right 05/03/2016   Procedure: TOTAL KNEE ARTHROPLASTY;  Surgeon: Melrose Nakayama, MD;  Location: [facility];  Service: Orthopedics;  Laterality: Right;    There were no vitals filed for this visit.      Subjective Assessment - 06/13/16 1108    Subjective Pt reports she feels she's had a set back and she's not sure if it is from some of the exercises - especially the SLS.     Patient Stated Goals improve her stamina, get as much motion as she can in her knee.  Walk 2 miles for exercise.    Currently in Pain? Yes   Pain Score 3    Pain Location Knee   Pain Orientation Right   Pain Descriptors / Indicators Aching   Aggravating Factors  standing on one leg, standing still    Pain Relieving Factors ice, elevation.             [facility] PT Assessment - 06/13/16 0001      Assessment   Medical Diagnosis Rt TKR   Referring  Provider Dr. Melrose Nakayama   Onset Date/Surgical Date 05/03/16   Hand Dominance Right   Next MD Visit 06/23/06   Prior Therapy HHPT     AROM   AROM Assessment Site Knee   Right/Left Knee Right   Right Knee Extension -3   Right Knee Flexion 110  seated scoot           [facility] Adult PT Treatment/Exercise - 06/13/16 0001      Knee/Hip Exercises: Stretches   Passive Hamstring Stretch Right;30 seconds;3 reps  overpressure with opp hand   Quad Stretch Right;3 reps   Gastroc Stretch Right;Left;2 reps;30 seconds     Knee/Hip Exercises: Aerobic   Recumbent Bike 5 min, 1/2 revolutions for ROM.      Knee/Hip Exercises: Seated   Other Seated Knee/Hip Exercises seated scoots with 10 sec hold x 10 reps      Knee/Hip Exercises: Supine   Bridges 2 sets;10 reps  2nd set with 3 sec hold.      Knee/Hip Exercises: Prone   Hamstring Curl 1 set;10 reps   Prone Knee Hang 1 minute  2 reps     Electrical Stimulation   Electrical Stimulation Location Rt knee  Astronomer Parameters to tolerance    Electrical Stimulation Goals Edema     Vasopneumatic   Number Minutes Vasopneumatic  15 minutes   Vasopnuematic Location  Knee  Rt   Vasopneumatic Pressure Low   Vasopneumatic Temperature  3*      Tandem stance x 15 sec x 2 reps each side.          PT Long Term Goals - 06/13/16 1147      PT LONG TERM GOAL #1   Title I with advanced HEP to include a walking program (07/19/16)    Time 6   Period Weeks   Status On-going     PT LONG TERM GOAL #2   Title ambulate without any gait deviations on even and uneven surfaces ( 07/19/16)    Time 6   Period Weeks   Status On-going     PT LONG TERM GOAL #3   Title demo Rt knee ROM -2 extension to 120 degrees flexion to assist with alternating steps ( 07/19/16)    Time 6   Period Weeks   Status On-going     PT LONG TERM GOAL #4   Title demo Rt LE strength =/ 5-/5 ( 07/19/16)    Time 6    Period Weeks   Status On-going     PT LONG TERM GOAL #5   Title improve FOTO =/< 36% limited, CJ level ( 07/19/16)    Time 6   Period Weeks   Status On-going     PT LONG TERM GOAL #6   Title report minimal to no pain in Rt knee to allow her to sleep per her previous level ( 07/19/16)    Time 6   Period Weeks   Status On-going               Plan - 06/13/16 1146    Clinical Impression Statement Pt tolerated all exercises well, with mild increase in pain in Rt knee.  She demonstrated improved Rt knee ROM.  She is progressing towards goals.    Rehab Potential Excellent   PT Frequency 2x / week   PT Duration 6 weeks   PT Treatment/Interventions Moist Heat;Ultrasound;Therapeutic exercise;Dry needling;Taping;Vasopneumatic Device;Manual techniques;Neuromuscular re-education;Cryotherapy;Electrical Stimulation;Patient/family education   PT Next Visit Plan progress knee ROM, LE ther ex and proprioception, modalities PRN   Consulted and Agree with Plan of Care Patient      Patient will benefit from skilled therapeutic intervention in order to improve the following deficits and impairments:  Decreased range of motion, Difficulty walking, Pain, Increased edema, Decreased strength, Decreased mobility  Visit Diagnosis: Stiffness of right knee, not elsewhere classified  Pain and swelling of right lower leg  Other abnormalities of gait and mobility  Muscle weakness (generalized)     Problem List Patient Active Problem List   Diagnosis Date Noted  . Primary localized osteoarthritis of right knee 05/03/2016  . Primary osteoarthritis of right knee 05/03/2016   Kerin Perna, PTA 06/13/16 11:50 AM  [facility] [place], [place], [address] Phone: [phone]   Fax:  [phone]  Name: Robin Christensen MRN: [id number] Date of Birth: 12/20/1950

## 2016-06-13 NOTE — Patient Instructions (Signed)
KNEE: Knee Hang - Prone    Lie on stomach. Place towel above knee; hang feet off surface. Keep feet straight. Hold __60_ seconds. _2__ reps per set.  As this gets easy, slide more of your leg off.   KNEE: Quadriceps - Prone    Place strap around ankle. Bring ankle toward buttocks. Press hip into surface. Hold __30_ seconds. __3_ reps per set,   Tandem Stance    Right foot in front of left, heel touching toe both feet "straight ahead". Stand on Foot Triangle of Support with both feet. Balance in this position _30__ seconds. Do with left foot in front of right.   [facility] [place], [place] [address]  [phone] (office) [phone] (fax)

## 2016-06-16 ENCOUNTER — Ambulatory Visit (INDEPENDENT_AMBULATORY_CARE_PROVIDER_SITE_OTHER): Payer: Commercial Managed Care - HMO | Admitting: Physical Therapy

## 2016-06-16 DIAGNOSIS — M25661 Stiffness of right knee, not elsewhere classified: Secondary | ICD-10-CM | POA: Diagnosis not present

## 2016-06-16 DIAGNOSIS — M7989 Other specified soft tissue disorders: Secondary | ICD-10-CM

## 2016-06-16 DIAGNOSIS — R2689 Other abnormalities of gait and mobility: Secondary | ICD-10-CM | POA: Diagnosis not present

## 2016-06-16 DIAGNOSIS — M79661 Pain in right lower leg: Secondary | ICD-10-CM

## 2016-06-16 DIAGNOSIS — M6281 Muscle weakness (generalized): Secondary | ICD-10-CM | POA: Diagnosis not present

## 2016-06-16 NOTE — Patient Instructions (Signed)
*   compression stocking for swelling reduction  SIT TO STAND: Feet in Modified Tandem    Place left foot in front of the right. Lean chest forward. Raise hips and straighten knees to stand. _10__ reps per set, _1__ sets per day, Hold onto a support as needed. Repeat with other leg.  Step-Up: Forward    Leading with right leg, bring both feet onto _6___ inch step. Return to starting position, leading with left leg. Repeat _10___ times per session. Do _2___ sessions per day.   [facility] at [facility] [place], [place] [address]  [phone] (office) [phone] (fax)

## 2016-06-16 NOTE — Therapy (Signed)
[place], [place], [address] Phone: [phone]   Fax:  [phone]  Physical Therapy Treatment  Patient Details  Name: Robin Christensen MRN: [id number] Date of Birth: 07/10/1951 Referring Provider: Dr. Melrose Nakayama  Encounter Date: 06/16/2016      PT End of Session - 06/16/16 0815    Visit Number 3   Number of Visits 12   Date for PT Re-Evaluation 07/19/16   PT Start Time 0805   PT Stop Time 0905   PT Time Calculation (min) 60 min   Activity Tolerance Patient tolerated treatment well;No increased pain      Past Medical History:  Diagnosis Date  . Cancer ([place])   . Dysrhythmia    AF  . Hyperlipidemia   . Hypertension   . Obesity   . PONV (postoperative nausea and vomiting)   . Sleep apnea    cpcap    Past Surgical History:  Procedure Laterality Date  . BREAST SURGERY    . CARDIOVERSION N/A 06/26/2014   Procedure: CARDIOVERSION;  Surgeon: Laverda Page, MD;  Location: [facility];  Service: Cardiovascular;  Laterality: N/A;  . CHOLECYSTECTOMY    . TOTAL KNEE ARTHROPLASTY Right 05/03/2016   Procedure: TOTAL KNEE ARTHROPLASTY;  Surgeon: Melrose Nakayama, MD;  Location: [facility];  Service: Orthopedics;  Laterality: Right;    There were no vitals filed for this visit.      Subjective Assessment - 06/16/16 0811    Subjective Pt reports her Rt shin is bothering her. She forgot to wear compression stocking to help with swelling.  Exercises are going well.    Currently in Pain? Yes   Pain Score 4    Pain Orientation Right   Pain Descriptors / Indicators Aching   Aggravating Factors  prolonged standing   Pain Relieving Factors elevation, ice             [facility] PT Assessment - 06/16/16 0001      Assessment   Medical Diagnosis Rt TKR     AROM   Right Knee Flexion 112  with seated scoot          [facility] Adult PT Treatment/Exercise - 06/16/16 0001      Knee/Hip  Exercises: Stretches   Passive Hamstring Stretch Right;30 seconds;3 reps  overpressure with opp hand   Multimedia programmer Right;Left;2 reps;30 seconds     Knee/Hip Exercises: Aerobic   Recumbent Bike 7 min, 1/2 revolutions for ROM.      Knee/Hip Exercises: Standing   Lateral Step Up Right;1 set;10 reps;Hand Hold: 2;Step Height: 6"   Forward Step Up Right;1 set;10 reps;Hand Hold: 2;Step Height: 6"   Step Down Left;1 set;10 reps;Hand Hold: 2;Step Height: 2"  (and step up backwards with Rt foot)    Other Standing Knee Exercises Reciprocal pattern on 3" stairs with BUE on rails x 12 steps      Knee/Hip Exercises: Seated   Other Seated Knee/Hip Exercises seated scoots with 10 sec hold x 10 reps    Sit to Sand without UE support;15 reps  high low table; VC + demo to improve form.      Acupuncturist Location Rt knee /shin   Astronomer Parameters to tolerance    Printmaker Goals Edema;Pain     Vasopneumatic   Number Minutes Vasopneumatic  15 minutes   Vasopnuematic  Location  Knee  Rt   Vasopneumatic Pressure Low   Vasopneumatic Temperature  3*      Manual Therapy   Manual Therapy Taping   Manual therapy comments I strip of Rock tape applied with 50% stretch applied to Rt knee incision for scar management.                 PT Education - 06/16/16 0858    Education provided Yes   Education Details HEP - added stairs and sit to/from stand   Person(s) Educated Patient   Methods Explanation;Handout;Demonstration   Comprehension Returned demonstration;Verbalized understanding             PT Long Term Goals - 06/13/16 1147      PT LONG TERM GOAL #1   Title I with advanced HEP to include a walking program (07/19/16)    Time 6   Period Weeks   Status On-going     PT LONG TERM GOAL #2   Title ambulate without any gait deviations on even  and uneven surfaces ( 07/19/16)    Time 6   Period Weeks   Status On-going     PT LONG TERM GOAL #3   Title demo Rt knee ROM -2 extension to 120 degrees flexion to assist with alternating steps ( 07/19/16)    Time 6   Period Weeks   Status On-going     PT LONG TERM GOAL #4   Title demo Rt LE strength =/ 5-/5 ( 07/19/16)    Time 6   Period Weeks   Status On-going     PT LONG TERM GOAL #5   Title improve FOTO =/< 36% limited, CJ level ( 07/19/16)    Time 6   Period Weeks   Status On-going     PT LONG TERM GOAL #6   Title report minimal to no pain in Rt knee to allow her to sleep per her previous level ( 07/19/16)    Time 6   Period Weeks   Status On-going               Plan - 06/16/16 0827    Clinical Impression Statement Pt demonstrated difficulty with sit to stand from chair without compensatory strategies.  Improved form, with less compensation and use of UE with reps performed from high low table and VC.   Slight improvement in Rt knee flexion ROM.   Progressing towards established goals.    Rehab Potential Excellent   PT Frequency 2x / week   PT Duration 6 weeks   PT Treatment/Interventions Moist Heat;Ultrasound;Therapeutic exercise;Dry needling;Taping;Vasopneumatic Device;Manual techniques;Neuromuscular re-education;Cryotherapy;Electrical Stimulation;Patient/family education   PT Next Visit Plan Continue progressive knee ROM, LE strengthening and proprioception; modalities PRN   Consulted and Agree with Plan of Care Patient      Patient will benefit from skilled therapeutic intervention in order to improve the following deficits and impairments:  Decreased range of motion, Difficulty walking, Pain, Increased edema, Decreased strength, Decreased mobility  Visit Diagnosis: Stiffness of right knee, not elsewhere classified  Pain and swelling of right lower leg  Other abnormalities of gait and mobility  Muscle weakness (generalized)     Problem List Patient  Active Problem List   Diagnosis Date Noted  . Primary localized osteoarthritis of right knee 05/03/2016  . Primary osteoarthritis of right knee 05/03/2016   Kerin Perna, PTA 06/16/16 9:02 AM  [place], [place], [address] Phone: [phone]  Fax:  [phone]  Name: TAMECKA MCALPINE MRN: [id number] Date of Birth: 1951-05-21

## 2016-06-20 ENCOUNTER — Ambulatory Visit (INDEPENDENT_AMBULATORY_CARE_PROVIDER_SITE_OTHER): Payer: Commercial Managed Care - HMO | Admitting: Physical Therapy

## 2016-06-20 DIAGNOSIS — M25661 Stiffness of right knee, not elsewhere classified: Secondary | ICD-10-CM

## 2016-06-20 DIAGNOSIS — M6281 Muscle weakness (generalized): Secondary | ICD-10-CM

## 2016-06-20 DIAGNOSIS — M79661 Pain in right lower leg: Secondary | ICD-10-CM | POA: Diagnosis not present

## 2016-06-20 DIAGNOSIS — M7989 Other specified soft tissue disorders: Secondary | ICD-10-CM

## 2016-06-20 DIAGNOSIS — R2689 Other abnormalities of gait and mobility: Secondary | ICD-10-CM | POA: Diagnosis not present

## 2016-06-20 NOTE — Therapy (Signed)
[place], [place], [address] Phone: [phone]   Fax:  [phone]  Physical Therapy Treatment  Patient Details  Name: Robin Christensen MRN: [id number] Date of Birth: September 01, 1950 Referring Provider: Dr. Melrose Nakayama   Encounter Date: 06/20/2016      PT End of Session - 06/20/16 0805    Visit Number 4   Number of Visits 12   Date for PT Re-Evaluation 07/19/16   PT Start Time 0803   PT Stop Time [id number]   PT Time Calculation (min) 55 min      Past Medical History:  Diagnosis Date  . Cancer ([place])   . Dysrhythmia    AF  . Hyperlipidemia   . Hypertension   . Obesity   . PONV (postoperative nausea and vomiting)   . Sleep apnea    cpcap    Past Surgical History:  Procedure Laterality Date  . BREAST SURGERY    . CARDIOVERSION N/A 06/26/2014   Procedure: CARDIOVERSION;  Surgeon: Laverda Page, MD;  Location: [facility];  Service: Cardiovascular;  Laterality: N/A;  . CHOLECYSTECTOMY    . 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      Subjective Assessment - 06/20/16 0805    Subjective Pt has been wearing compression stocking on her Rt leg and it has helped reduce her Rt shin pain.  Otherwise, nothing new to report.     Currently in Pain? No/denies   Pain Score 0-No pain            [facility] PT Assessment - 06/20/16 0001      Assessment   Medical Diagnosis Rt TKR   Referring Provider Dr. Melrose Nakayama    Onset Date/Surgical Date 05/03/16   Hand Dominance Right   Next MD Visit 06/23/06     AROM   Right Knee Extension -6  with quad set   Right Knee Flexion 112  with heel slide.            [facility] Adult PT Treatment/Exercise - 06/20/16 0001      Knee/Hip Exercises: Stretches   Gastroc Stretch  Right;Left;2 reps;30 seconds     Knee/Hip Exercises: Aerobic   Recumbent Bike 7 min, 1/2 revolutions for ROM.      Knee/Hip Exercises: Standing   Heel Raises Both;10 reps   Lateral Step Up Right;20 reps;Hand Hold: 2;Step Height: 6"   Forward Step Up Right;1 set;Hand Hold: 1;Step Height: 6";10 reps   Step Down Left;1 set;Hand Hold: 2;Step Height: 2";15 reps  (and step up backwards with Rt foot)    SLS 3 trials:  upto 6 sec on LLE, 3 sec on RLE.    Other Standing Knee Exercises tandem stance x 30 sec each leg;      Knee/Hip Exercises: Seated   Other Seated Knee/Hip Exercises seated scoots with 10 sec hold x 10 reps      Knee/Hip Exercises: Supine   Heel Slides Right;1 set;5 reps     Electrical Stimulation   Electrical Stimulation Location Rt knee/ shin   Electrical Stimulation Action ion repelling   Electrical Stimulation Parameters to tolerance    Electrical Stimulation Goals Edema;Pain     Vasopneumatic   Number Minutes Vasopneumatic  15 minutes   Vasopnuematic Location  Knee  Rt  Vasopneumatic Pressure Low                     PT Long Term Goals - 06/13/16 1147      PT LONG TERM GOAL #1   Title I with advanced HEP to include a walking program (07/19/16)    Time 6   Period Weeks   Status On-going     PT LONG TERM GOAL #2   Title ambulate without any gait deviations on even and uneven surfaces ( 07/19/16)    Time 6   Period Weeks   Status On-going     PT LONG TERM GOAL #3   Title demo Rt knee ROM -2 extension to 120 degrees flexion to assist with alternating steps ( 07/19/16)    Time 6   Period Weeks   Status On-going     PT LONG TERM GOAL #4   Title demo Rt LE strength =/ 5-/5 ( 07/19/16)    Time 6   Period Weeks   Status On-going     PT LONG TERM GOAL #5   Title improve FOTO =/< 36% limited, CJ level ( 07/19/16)    Time 6   Period Weeks   Status On-going     PT LONG TERM GOAL #6   Title report minimal to no pain in Rt knee to allow her to sleep  per her previous level ( 07/19/16)    Time 6   Period Weeks   Status On-going               Plan - 06/20/16 1136    Clinical Impression Statement Pt continues with antalgic gait when observed between exercises.  Her Rt knee flexion ROM unchanged, extension ROM decreased from last visit.  She tolerated stairs with greater ease this time.  She is making gradual gains towards established goals.    Rehab Potential Excellent   PT Frequency 2x / week   PT Duration 6 weeks   PT Treatment/Interventions Moist Heat;Ultrasound;Therapeutic exercise;Dry needling;Taping;Vasopneumatic Device;Manual techniques;Neuromuscular re-education;Cryotherapy;Electrical Stimulation;Patient/family education   PT Next Visit Plan Write MD note (appt following day).  Continue progressive ROM for knee; trial of [name] and manual to post thigh/knee.    Consulted and Agree with Plan of Care Patient      Patient will benefit from skilled therapeutic intervention in order to improve the following deficits and impairments:  Decreased range of motion, Difficulty walking, Pain, Increased edema, Decreased strength, Decreased mobility  Visit Diagnosis: Stiffness of right knee, not elsewhere classified  Pain and swelling of right lower leg  Other abnormalities of gait and mobility  Muscle weakness (generalized)     Problem List Patient Active Problem List   Diagnosis Date Noted  . Primary localized osteoarthritis of right knee 05/03/2016  . Primary osteoarthritis of right knee 05/03/2016   Kerin Perna, PTA 06/20/16 11:39 AM  [place], [place], [address] Phone: [phone]   Fax:  [phone]  Name: Robin Christensen MRN: [id number] Date of Birth: 1951-03-20

## 2016-06-23 ENCOUNTER — Ambulatory Visit (INDEPENDENT_AMBULATORY_CARE_PROVIDER_SITE_OTHER): Payer: Commercial Managed Care - HMO | Admitting: Physical Therapy

## 2016-06-23 DIAGNOSIS — M25661 Stiffness of right knee, not elsewhere classified: Secondary | ICD-10-CM

## 2016-06-23 DIAGNOSIS — M6281 Muscle weakness (generalized): Secondary | ICD-10-CM | POA: Diagnosis not present

## 2016-06-23 DIAGNOSIS — R2689 Other abnormalities of gait and mobility: Secondary | ICD-10-CM | POA: Diagnosis not present

## 2016-06-23 DIAGNOSIS — M7989 Other specified soft tissue disorders: Secondary | ICD-10-CM

## 2016-06-23 DIAGNOSIS — M79661 Pain in right lower leg: Secondary | ICD-10-CM | POA: Diagnosis not present

## 2016-06-23 NOTE — Patient Instructions (Addendum)
*   Self massage with roller to Right leg (thigh - top, bottom, inside, outside) ~3-5 minutes each day.   Adductor Stretch: Reclined (Strap, Wall)    Warm up with leg vertical. Rotate leg to side and fix foot to wall. Anchor opposite hip. Hold for _30___ seconds. Repeat _2___ times each leg.   [facility] [address] [place], [place] [address]  [phone] (office) [phone] (fax)

## 2016-06-23 NOTE — Therapy (Signed)
[place], [place], [address] Phone: [phone]   Fax:  [phone]  Physical Therapy Treatment  Patient Details  Name: Robin Christensen MRN: [id number] Date of Birth: September 05, 1950 Referring Provider: Dr. Melrose Nakayama  Encounter Date: 06/23/2016      PT End of Session - 06/23/16 0852    Visit Number 5   Number of Visits 12   Date for PT Re-Evaluation 07/19/16   PT Start Time 0803   PT Stop Time 0901   PT Time Calculation (min) 58 min   Activity Tolerance Patient tolerated treatment well;No increased pain      Past Medical History:  Diagnosis Date  . Cancer ([place])   . Dysrhythmia    AF  . Hyperlipidemia   . Hypertension   . Obesity   . PONV (postoperative nausea and vomiting)   . Sleep apnea    cpcap    Past Surgical History:  Procedure Laterality Date  . BREAST SURGERY    . CARDIOVERSION N/A 06/26/2014   Procedure: CARDIOVERSION;  Surgeon: Laverda Page, MD;  Location: [facility];  Service: Cardiovascular;  Laterality: N/A;  . CHOLECYSTECTOMY    . 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      Subjective Assessment - 06/23/16 0810    Subjective "Sitting for long periods is creating a problem".  She reports when she has to drive or sit for prolonged periods, her knee gets stiff.  She has been working more on extension for last 2 days.    Currently in Pain? No/denies  had twinge of pain getting out of car; resolved with walking   Pain Score 0-No pain            [facility] PT Assessment - 06/23/16 0001      Assessment   Medical Diagnosis Rt TKR   Referring Provider Dr. Melrose Nakayama   Onset Date/Surgical Date 05/03/16   Hand Dominance Right   Next MD Visit 06/24/06     AROM   Right Knee  Extension -2  after stretches    Right Knee Flexion 107  has been up to 112 deg in previous session.      Strength   Right Knee Flexion --  5-/5   Right Knee Extension 5/5     Palpation   Palpation comment Tight medial Rt hamstring           [facility] Adult PT Treatment/Exercise - 06/23/16 0001      Self-Care   Self-Care Other Self-Care Comments   Other Self-Care Comments  Pt educated on self massage with roller/stick. Pt returned demo.      Knee/Hip Exercises: Stretches   Passive Hamstring Stretch Right;2 reps;60 seconds   Gastroc Stretch 2 reps;Right;30 seconds   Other Knee/Hip Stretches Adductor stretch with strap x 60 sec x 2 reps (RLE, supine)     Knee/Hip Exercises: Aerobic   Recumbent Bike 8 min, 1/2 revolutions to slow full revolutions.    Other Aerobic Laps around gym (3) between exercises.      Knee/Hip Exercises: Standing   Step Down Left;1 set;Hand Hold: 2;Step Height: 2";10 reps  (and step up backwards with Rt foot)    Step Down Limitations (then 10 reps on 6" step.    Other Standing Knee Exercises  Rt TKE with ball against wall, 5 sec hold x 10 reps      Knee/Hip Exercises: Prone   Prone Knee Hang 3 minutes  with STM to Rt posterior thigh     Modalities   Modalities Cryotherapy;Electrical Stimulation;Moist Heat     Moist Heat Therapy   Number Minutes Moist Heat 15 Minutes   Moist Heat Location Knee  Rt posterior     Cryotherapy   Number Minutes Cryotherapy 15 Minutes   Cryotherapy Location Knee  Rt anterior   Type of Cryotherapy Ice pack     Electrical Stimulation   Electrical Stimulation Location Rt knee/hamstring    Electrical Stimulation Action IFC   Electrical Stimulation Parameters to tolerance   Electrical Stimulation Goals Pain                PT Education - 06/23/16 0102    Education provided Yes   Education Details HEP - added adductor stretch.    Person(s) Educated Patient   Methods Explanation;Handout;Demonstration    Comprehension Verbalized understanding;Returned demonstration             PT Long Term Goals - 06/23/16 0906      PT LONG TERM GOAL #1   Title I with advanced HEP to include a walking program (07/19/16)    Time 6   Period Weeks   Status On-going     PT LONG TERM GOAL #2   Title ambulate without any gait deviations on even and uneven surfaces ( 07/19/16)    Time 6   Period Weeks   Status On-going     PT LONG TERM GOAL #3   Title demo Rt knee ROM -2 extension to 120 degrees flexion to assist with alternating steps ( 07/19/16)    Time 6   Period Weeks   Status Partially Met     PT LONG TERM GOAL #4   Title demo Rt LE strength =/ 5-/5 ( 07/19/16)    Time 6   Period Weeks   Status Partially Met     PT LONG TERM GOAL #5   Title improve FOTO =/< 36% limited, CJ level ( 07/19/16)    Time 6   Period Weeks   Status On-going     PT LONG TERM GOAL #6   Title report minimal to no pain in Rt knee to allow her to sleep per her previous level ( 07/19/16)    Time 6   Period Weeks   Status On-going               Plan - 06/23/16 0856    Clinical Impression Statement Pt demonstrated improved Rt knee extension today.  Notable tightness/tenderness in Rt hamstring and adductor group.  Pt now able to make full revolutions on recumbent bike.   Her Rt knee strength has improved; still demonstrates functional weakness with sit to/from stand and stairs.   Pt is progressing towards established goals and will benefit from continued PT intervention to maximize functional mobility.    Rehab Potential Excellent   PT Frequency 2x / week   PT Duration 6 weeks   PT Treatment/Interventions Moist Heat;Ultrasound;Therapeutic exercise;Dry needling;Taping;Vasopneumatic Device;Manual techniques;Neuromuscular re-education;Cryotherapy;Electrical Stimulation;Patient/family education   PT Next Visit Plan  Continue progressive ROM for knee; trial of [name] and manual to post thigh/knee. Possible TDN to Rt hamstring  (pt has been informed about procedure)   Consulted and Agree with Plan of Care Patient      Patient will benefit from skilled  therapeutic intervention in order to improve the following deficits and impairments:  Decreased range of motion, Difficulty walking, Pain, Increased edema, Decreased strength, Decreased mobility  Visit Diagnosis: Stiffness of right knee, not elsewhere classified  Pain and swelling of right lower leg  Other abnormalities of gait and mobility  Muscle weakness (generalized)     Problem List Patient Active Problem List   Diagnosis Date Noted  . Primary localized osteoarthritis of right knee 05/03/2016  . Primary osteoarthritis of right knee 05/03/2016   Kerin Perna, PTA 06/23/16 9:14 AM  [facility] [place], [place], [address] Phone: [phone]   Fax:  [phone]  Name: Robin Christensen MRN: [id number] Date of Birth: 03/06/1951

## 2016-06-24 DIAGNOSIS — M1711 Unilateral primary osteoarthritis, right knee: Secondary | ICD-10-CM | POA: Diagnosis not present

## 2016-06-24 DIAGNOSIS — M1712 Unilateral primary osteoarthritis, left knee: Secondary | ICD-10-CM | POA: Diagnosis not present

## 2016-06-27 ENCOUNTER — Encounter: Payer: Self-pay | Admitting: Physical Therapy

## 2016-06-27 ENCOUNTER — Ambulatory Visit (INDEPENDENT_AMBULATORY_CARE_PROVIDER_SITE_OTHER): Payer: Commercial Managed Care - HMO | Admitting: Physical Therapy

## 2016-06-27 DIAGNOSIS — M6281 Muscle weakness (generalized): Secondary | ICD-10-CM

## 2016-06-27 DIAGNOSIS — R2689 Other abnormalities of gait and mobility: Secondary | ICD-10-CM

## 2016-06-27 DIAGNOSIS — M7989 Other specified soft tissue disorders: Secondary | ICD-10-CM

## 2016-06-27 DIAGNOSIS — M25661 Stiffness of right knee, not elsewhere classified: Secondary | ICD-10-CM | POA: Diagnosis not present

## 2016-06-27 DIAGNOSIS — M79661 Pain in right lower leg: Secondary | ICD-10-CM

## 2016-06-27 NOTE — Therapy (Signed)
[place], [place], [address] Phone: [phone]   Fax:  [phone]  Physical Therapy Treatment  Patient Details  Name: Robin Christensen MRN: [id number] Date of Birth: 09/26/1950 Referring Provider: Dr Melrose Nakayama  Encounter Date: 06/27/2016      PT End of Session - 06/27/16 0801    Visit Number 6   Number of Visits 12   Date for PT Re-Evaluation 07/19/16   PT Start Time 0801   PT Stop Time 0900   PT Time Calculation (min) 59 min   Activity Tolerance Patient tolerated treatment well      Past Medical History:  Diagnosis Date  . Cancer ([place])   . Dysrhythmia    AF  . Hyperlipidemia   . Hypertension   . Obesity   . PONV (postoperative nausea and vomiting)   . Sleep apnea    cpcap    Past Surgical History:  Procedure Laterality Date  . BREAST SURGERY    . CARDIOVERSION N/A 06/26/2014   Procedure: CARDIOVERSION;  Surgeon: Laverda Page, MD;  Location: [facility];  Service: Cardiovascular;  Laterality: N/A;  . CHOLECYSTECTOMY    . 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      Subjective Assessment - 06/27/16 0801    Subjective Pt reports she went up to a different bathroom that didn't have a raised commode and felt pain and more bend. She has been walking more and using roller on leg. Saw MD last week and they are not renewing her pain medicine.    Currently in Pain? Yes   Pain Score 3    Pain Location Knee   Pain Orientation Right   Pain Descriptors / Indicators Aching   Pain Type Surgical pain   Aggravating Factors  big bend   Pain Relieving Factors ice            [facility] PT Assessment - 06/27/16 0001      Assessment   Medical Diagnosis Rt TKR   Referring Provider Dr Melrose Nakayama   Onset Date/Surgical Date 05/03/16   Next MD Visit 07/23/16     AROM   Right Knee Extension -1   Right Knee Flexion 110     Palpation   Palpation comment tightness posterior Rt knee, decreased after ultrasound                     [facility] Adult PT Treatment/Exercise - 06/27/16 0001      Knee/Hip Exercises: Aerobic   Recumbent Bike full revolutions x 8'  L1x2'     Knee/Hip Exercises: Standing   Wall Squat 2 sets;10 reps     Knee/Hip Exercises: Supine   Short Arc Quad Sets Strengthening;Right;3 sets;10 reps  3#   Heel Slides AROM;Right;10 reps   Heel Slides Limitations 5-10 sec holds, self mobs     Knee/Hip Exercises: Prone   Hamstring Curl 3 sets;10 reps   Hamstring Curl Limitations 3# Rt    Prone Knee Hang Limitations quad setsx10, 10 sec holds     Modalities   Modalities Electrical Stimulation;Vasopneumatic;Ultrasound     Acupuncturist Location Rt knee   Electrical Stimulation Action ion repelling   Electrical Stimulation Parameters to tolerance   Electrical Stimulation Goals Edema;Pain  Ultrasound   Ultrasound Location posterior rt knee   Ultrasound Parameters 100%, 1.5w/cm2, 1.100mz   Ultrasound Goals Pain     Vasopneumatic   Number Minutes Vasopneumatic  15 minutes   Vasopnuematic Location  Knee   Vasopneumatic Pressure Low                     PT Long Term Goals - 06/27/16 07530     PT LONG TERM GOAL #1   Title I with advanced HEP to include a walking program (07/19/16)    Status On-going     PT LONG TERM GOAL #2   Title ambulate without any gait deviations on even and uneven surfaces ( 07/19/16)    Status On-going  walking on different surfaces, does still have antalgic gait.     PT LONG TERM GOAL #3   Title demo Rt knee ROM -2 extension to 120 degrees flexion to assist with alternating steps ( 07/19/16)    Status Partially Met     PT LONG TERM GOAL #4   Title demo Rt LE strength =/ 5-/5 (  07/19/16)    Status Partially Met     PT LONG TERM GOAL #5   Title improve FOTO =/< 36% limited, CJ level ( 07/19/16)    Status On-going     PT LONG TERM GOAL #6   Title report minimal to no pain in Rt knee to allow her to sleep per her previous level ( 07/19/16)    Status On-going  limited to about 4 hrs a night               Plan - 06/27/16 0844    Clinical Impression Statement PJennyfercontinues to make improvements.  Her knee ROM is getting better.  She has less tightness in the hamstring however more in the posterior knee.  The roller is loosening her muscles.  Making progress to goals.    Rehab Potential Excellent   PT Frequency 2x / week   PT Duration 6 weeks   PT Treatment/Interventions Moist Heat;Ultrasound;Therapeutic exercise;Dry needling;Taping;Vasopneumatic Device;Manual techniques;Neuromuscular re-education;Cryotherapy;Electrical Stimulation;Patient/family education   PT Next Visit Plan assess response to [place] progress proprioception.       Patient will benefit from skilled therapeutic intervention in order to improve the following deficits and impairments:  Decreased range of motion, Difficulty walking, Pain, Increased edema, Decreased strength, Decreased mobility  Visit Diagnosis: Stiffness of right knee, not elsewhere classified  Pain and swelling of right lower leg  Other abnormalities of gait and mobility  Muscle weakness (generalized)     Problem List Patient Active Problem List   Diagnosis Date Noted  . Primary localized osteoarthritis of right knee 05/03/2016  . Primary osteoarthritis of right knee 05/03/2016    SJeral PinchPT  06/27/2016, 8:46 AM  [facility] [address]Phone: [phone]  Fax:  [phone] Name: Robin JANSMAMRN: [id number] of Birth: 208/23/1952

## 2016-06-30 ENCOUNTER — Ambulatory Visit (INDEPENDENT_AMBULATORY_CARE_PROVIDER_SITE_OTHER): Payer: Commercial Managed Care - HMO | Admitting: Physical Therapy

## 2016-06-30 DIAGNOSIS — M79661 Pain in right lower leg: Secondary | ICD-10-CM | POA: Diagnosis not present

## 2016-06-30 DIAGNOSIS — R2689 Other abnormalities of gait and mobility: Secondary | ICD-10-CM

## 2016-06-30 DIAGNOSIS — M7989 Other specified soft tissue disorders: Secondary | ICD-10-CM | POA: Diagnosis not present

## 2016-06-30 DIAGNOSIS — M25661 Stiffness of right knee, not elsewhere classified: Secondary | ICD-10-CM

## 2016-06-30 NOTE — Therapy (Signed)
[place], [place], [address] Phone: [phone]   Fax:  [phone]  Physical Therapy Treatment  Patient Details  Name: Robin Christensen MRN: [id number] Date of Birth: 10/17/1950 Referring Provider: Dr. Melrose Nakayama  Encounter Date: 06/30/2016      PT End of Session - 06/30/16 0847    Visit Number 7   Number of Visits 12   Date for PT Re-Evaluation 07/19/16   PT Start Time 0807   PT Stop Time 0858   PT Time Calculation (min) 51 min   Activity Tolerance Patient tolerated treatment well   Behavior During Therapy [facility] for tasks assessed/performed      Past Medical History:  Diagnosis Date  . Cancer ([place])   . Dysrhythmia    AF  . Hyperlipidemia   . Hypertension   . Obesity   . PONV (postoperative nausea and vomiting)   . Sleep apnea    cpcap    Past Surgical History:  Procedure Laterality Date  . BREAST SURGERY    . CARDIOVERSION N/A 06/26/2014   Procedure: CARDIOVERSION;  Surgeon: Laverda Page, MD;  Location: [facility];  Service: Cardiovascular;  Laterality: N/A;  . CHOLECYSTECTOMY    . 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      Subjective Assessment - 06/30/16 0812    Subjective Pt reports she didn't do any exercise over last 2 days since she has been so busy.  She has been walking a lot. She feels the ultrasound to back of knee helped last session.    Currently in Pain? Yes   Pain Score 2    Pain Location Knee   Pain Orientation Right   Pain Descriptors / Indicators Aching   Aggravating Factors  bending knee past the comfort point    Pain Relieving Factors ice             [facility] PT Assessment - 06/30/16 0001      Assessment   Medical Diagnosis Rt TKR   Referring Provider Dr.  Melrose Nakayama   Onset Date/Surgical Date 05/03/16   Hand Dominance Right   Next MD Visit 07/23/16          [facility] Adult PT Treatment/Exercise - 06/30/16 0001      Knee/Hip Exercises: Stretches   Passive Hamstring Stretch Right;2 reps;30 seconds   Quad Stretch Right;2 reps;60 seconds   Gastroc Stretch Left;Right;2 reps     Knee/Hip Exercises: Aerobic   Recumbent Bike full revolutions x 6 min  L2x 3 min    Other Aerobic Laps around gym (3) between exercises.      Knee/Hip Exercises: Standing   Heel Raises Both;1 set;10 reps   Forward Step Up Right;1 set;15 reps;Hand Hold: 1;Step Height: 6"   Functional Squat 1 set;10 reps   Functional Squat Limitations tactile to increase weight shift to Rt.    Wall Squat 10 reps   Wall Squat Limitations mirror and VC for improved wt distribution    SLS Multiple trials RLE, with occasional UE support    Other Standing Knee Exercises Rt TKE with ball against wall, 5 sec hold x 10 reps    Other Standing Knee Exercises tandem stance with horiz head turns x 30 sec x 1 rep each foot forward.   1  rep of Lt high kneeling to standing (wiht Lt knee on 3" pad)      Electrical Stimulation   Electrical Stimulation Location Rt knee   Electrical Stimulation Action ion repelling   Electrical Stimulation Parameters to tolerance    Electrical Stimulation Goals Edema;Pain     Vasopneumatic   Number Minutes Vasopneumatic  15 minutes   Vasopnuematic Location  Knee   Vasopneumatic Pressure Low   Vasopneumatic Temperature  3*                      PT Long Term Goals - 06/27/16 0806      PT LONG TERM GOAL #1   Title I with advanced HEP to include a walking program (07/19/16)    Status On-going     PT LONG TERM GOAL #2   Title ambulate without any gait deviations on even and uneven surfaces ( 07/19/16)    Status On-going  walking on different surfaces, does still have antalgic gait.     PT LONG TERM GOAL #3   Title demo Rt knee ROM -2 extension  to 120 degrees flexion to assist with alternating steps ( 07/19/16)    Status Partially Met     PT LONG TERM GOAL #4   Title demo Rt LE strength =/ 5-/5 ( 07/19/16)    Status Partially Met     PT LONG TERM GOAL #5   Title improve FOTO =/< 36% limited, CJ level ( 07/19/16)    Status On-going     PT LONG TERM GOAL #6   Title report minimal to no pain in Rt knee to allow her to sleep per her previous level ( 07/19/16)    Status On-going  limited to about 4 hrs a night               Plan - 06/30/16 1227    Rehab Potential Excellent   PT Frequency 2x / week   PT Duration 6 weeks   PT Treatment/Interventions Moist Heat;Ultrasound;Therapeutic exercise;Dry needling;Taping;Vasopneumatic Device;Manual techniques;Neuromuscular re-education;Cryotherapy;Electrical Stimulation;Patient/family education   PT Next Visit Plan Continue progressive ROM/ strengthening for Rt knee    Consulted and Agree with Plan of Care Patient      Patient will benefit from skilled therapeutic intervention in order to improve the following deficits and impairments:  Decreased range of motion, Difficulty walking, Pain, Increased edema, Decreased strength, Decreased mobility  Visit Diagnosis: Stiffness of right knee, not elsewhere classified  Pain and swelling of right lower leg  Other abnormalities of gait and mobility     Problem List Patient Active Problem List   Diagnosis Date Noted  . Primary localized osteoarthritis of right knee 05/03/2016  . Primary osteoarthritis of right knee 05/03/2016   Kerin Perna, PTA 06/30/16 12:30 PM  [facility] [place], [place], [address] Phone: [phone]   Fax:  [phone]  Name: Robin Christensen MRN: [id number] Date of Birth: September 28, 1950

## 2016-07-04 ENCOUNTER — Ambulatory Visit (INDEPENDENT_AMBULATORY_CARE_PROVIDER_SITE_OTHER): Payer: Commercial Managed Care - HMO | Admitting: Physical Therapy

## 2016-07-04 DIAGNOSIS — R2689 Other abnormalities of gait and mobility: Secondary | ICD-10-CM

## 2016-07-04 DIAGNOSIS — M7989 Other specified soft tissue disorders: Secondary | ICD-10-CM

## 2016-07-04 DIAGNOSIS — M6281 Muscle weakness (generalized): Secondary | ICD-10-CM

## 2016-07-04 DIAGNOSIS — M79661 Pain in right lower leg: Secondary | ICD-10-CM | POA: Diagnosis not present

## 2016-07-04 DIAGNOSIS — M25661 Stiffness of right knee, not elsewhere classified: Secondary | ICD-10-CM | POA: Diagnosis not present

## 2016-07-04 NOTE — Therapy (Signed)
[place], [place], [address] Phone: [phone]   Fax:  [phone]  Physical Therapy Treatment  Patient Details  Name: Robin Christensen MRN: [id number] Date of Birth: [birth year] Referring Provider: Dr. Melrose Nakayama  Encounter Date: 07/04/2016      PT End of Session - 07/04/16 0809    Visit Number 8   Number of Visits 12   Date for PT Re-Evaluation 07/19/16   PT Start Time 0800   PT Stop Time 0856   PT Time Calculation (min) 56 min   Activity Tolerance Patient tolerated treatment well      Past Medical History:  Diagnosis Date  . Cancer ([place])   . Dysrhythmia    AF  . Hyperlipidemia   . Hypertension   . Obesity   . PONV (postoperative nausea and vomiting)   . Sleep apnea    cpcap    Past Surgical History:  Procedure Laterality Date  . BREAST SURGERY    . CARDIOVERSION N/A 06/26/2014   Procedure: CARDIOVERSION;  Surgeon: Laverda Page, MD;  Location: [place];  Service: Cardiovascular;  Laterality: N/A;  . CHOLECYSTECTOMY    . 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      Subjective Assessment - 07/04/16 0837    Subjective Pt reports she has been busy with christmas activities, but was able to do some exercises this weekend.     Patient Stated Goals improve her stamina, get as much motion as she can in her knee.  Walk 2 miles for exercise.    Currently in Pain? No/denies   Pain Score 0-No pain            [facility] PT Assessment - 07/04/16 0001      Assessment   Medical Diagnosis Rt TKR   Referring Provider Dr. Melrose Nakayama   Onset Date/Surgical Date 05/03/16   Hand Dominance Right   Next MD Visit 07/23/16     AROM   AROM Assessment Site Knee   Right/Left Knee Right   Right Knee  Extension 0   Right Knee Flexion 110                     [facility] Adult PT Treatment/Exercise - 07/04/16 0001      Knee/Hip Exercises: Stretches   Passive Hamstring Stretch Right;2 reps;30 seconds   Quad Stretch Right;2 reps;60 seconds   Gastroc Stretch Right;Left;3 reps;30 seconds     Knee/Hip Exercises: Aerobic   Recumbent Bike L2: 7 min     Knee/Hip Exercises: Standing   Heel Raises 1 set;20 reps;Both   Lateral Step Up Right;1 set;20 reps;Hand Hold: 1;Step Height: 6"   Forward Step Up Right;1 set;20 reps;Hand Hold: 1;Step Height: 6"   Wall Squat 15 reps   Wall Squat Limitations VC to shift weight more to right.     SLS Multiple trials RLE, up to 11 seconds. (up to 20 sec on LLE)     Knee/Hip Exercises: Seated   Sit to Sand without UE support;15 reps  with pad under Lt foot to encourage increase wt in RLE>      Vasopneumatic   Number Minutes Vasopneumatic  15 minutes   Vasopnuematic Location  Knee   Vasopneumatic Pressure Low   Vasopneumatic Temperature  3*  PT Long Term Goals - 07/04/16 8786      PT LONG TERM GOAL #1   Title I with advanced HEP to include a walking program (07/19/16)    Time 6   Period Weeks   Status Partially Met  now walking 15 min with dog.      PT LONG TERM GOAL #2   Title ambulate without any gait deviations on even and uneven surfaces ( 07/19/16)    Time 6   Period Weeks   Status On-going     PT LONG TERM GOAL #3   Title demo Rt knee ROM -2 extension to 120 degrees flexion to assist with alternating steps ( 07/19/16)    Time 6   Period Weeks   Status Partially Met     PT LONG TERM GOAL #4   Title demo Rt LE strength =/ 5-/5 ( 07/19/16)    Time 6   Period Weeks   Status Partially Met     PT LONG TERM GOAL #5   Title improve FOTO =/< 36% limited, CJ level ( 07/19/16)    Time 6   Period Weeks   Status On-going     PT LONG TERM GOAL #6   Title report minimal to no pain in Rt knee to allow her to  sleep per her previous level ( 07/19/16)    Time 6   Period Weeks   Status On-going  sleeping 5 hours at a time.  (6-7 prior to surgery)                Plan - 07/04/16 0835    Clinical Impression Statement Pt's Rt knee extension ROM now at 0 deg. She is tolerating more reps of exercises before fatiguing.  She is progressing towards established goals.    Rehab Potential Excellent   PT Frequency 2x / week   PT Duration 6 weeks   PT Treatment/Interventions Moist Heat;Ultrasound;Therapeutic exercise;Dry needling;Taping;Vasopneumatic Device;Manual techniques;Neuromuscular re-education;Cryotherapy;Electrical Stimulation;Patient/family education   PT Next Visit Plan Continue progressive ROM/ strengthening for Rt knee    Consulted and Agree with Plan of Care Patient      Patient will benefit from skilled therapeutic intervention in order to improve the following deficits and impairments:  Decreased range of motion, Difficulty walking, Pain, Increased edema, Decreased strength, Decreased mobility  Visit Diagnosis: Stiffness of right knee, not elsewhere classified  Pain and swelling of right lower leg  Other abnormalities of gait and mobility  Muscle weakness (generalized)     Problem List Patient Active Problem List   Diagnosis Date Noted  . Primary localized osteoarthritis of right knee 05/03/2016  . Primary osteoarthritis of right knee 05/03/2016   Kerin Perna, PTA 07/04/16 8:45 AM  [facility] [place], [place], [address] Phone: [phone]   Fax:  [phone]  Name: Robin Christensen MRN: [id number] Date of Birth: [birth year]

## 2016-07-07 ENCOUNTER — Encounter: Payer: Self-pay | Admitting: Physical Therapy

## 2016-07-07 ENCOUNTER — Ambulatory Visit (INDEPENDENT_AMBULATORY_CARE_PROVIDER_SITE_OTHER): Payer: Commercial Managed Care - HMO | Admitting: Physical Therapy

## 2016-07-07 DIAGNOSIS — R2689 Other abnormalities of gait and mobility: Secondary | ICD-10-CM

## 2016-07-07 DIAGNOSIS — M25661 Stiffness of right knee, not elsewhere classified: Secondary | ICD-10-CM | POA: Diagnosis not present

## 2016-07-07 DIAGNOSIS — M7989 Other specified soft tissue disorders: Secondary | ICD-10-CM

## 2016-07-07 DIAGNOSIS — M79661 Pain in right lower leg: Secondary | ICD-10-CM

## 2016-07-07 DIAGNOSIS — M6281 Muscle weakness (generalized): Secondary | ICD-10-CM

## 2016-07-07 NOTE — Therapy (Signed)
Robin Christensen, [place], [address] Phone: [phone]   Fax:  [phone]  Physical Therapy Treatment  Patient Details  Name: Robin Christensen MRN: [id number] Date of Birth: [birth year] Referring Provider: Dr. Melrose Nakayama  Encounter Date: 07/07/2016      PT End of Session - 07/07/16 1129    Visit Number 9   Number of Visits 12   Date for PT Re-Evaluation 07/19/16   PT Start Time 1105   PT Stop Time 1145   PT Time Calculation (min) 40 min   Activity Tolerance Patient tolerated treatment well   Behavior During Therapy [facility] for tasks assessed/performed      Past Medical History:  Diagnosis Date  . Cancer ([place])   . Dysrhythmia    AF  . Hyperlipidemia   . Hypertension   . Obesity   . PONV (postoperative nausea and vomiting)   . Sleep apnea    cpcap    Past Surgical History:  Procedure Laterality Date  . BREAST SURGERY    . CARDIOVERSION N/A 06/26/2014   Procedure: CARDIOVERSION;  Surgeon: Laverda Page, MD;  Location: [facility];  Service: Cardiovascular;  Laterality: N/A;  . CHOLECYSTECTOMY    . 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      Subjective Assessment - 07/07/16 1116    Subjective Pt reports 1/10 pain in R knee, more stiffness. Pt reports difficutly with donning pants and shoes.    How long can you sit comfortably? 30 min   How long can you walk comfortably? ~ 2 blocks, limited by pain and fatigue   Patient Stated Goals improve her stamina, get as much motion as she can in her knee.  Walk 2 miles for exercise. Get my shoes and pants on without difficutly.    Currently in Pain? Yes   Pain Score 1    Pain Location Knee   Pain Orientation Right   Pain Descriptors / Indicators Aching   Pain  Type Surgical pain   Pain Onset More than a month ago   Pain Frequency Intermittent   Aggravating Factors  bending knee, putting on shoes,    Pain Relieving Factors ice   Multiple Pain Sites No                         [facility] Adult PT Treatment/Exercise - 07/07/16 0001      Knee/Hip Exercises: Stretches   Gastroc Stretch Right;Left;3 reps;30 seconds     Knee/Hip Exercises: Aerobic   Recumbent Bike L2, 8 minutes     Knee/Hip Exercises: Standing   Heel Raises 1 set;20 reps;Both   Lateral Step Up Right;1 set;20 reps;Hand Hold: 1;Step Height: 6"   Forward Step Up Right;1 set;20 reps;Hand Hold: 1;Step Height: 6"   Wall Squat 15 reps   Wall Squat Limitations VC to shift weight more to right.       Knee/Hip Exercises: Seated   Sit to Sand without UE support;15 reps  with pad under Lt foot to encourage increase wt in RLE>      Vasopneumatic   Number Minutes Vasopneumatic  15 minutes   Vasopnuematic Location  Knee   Vasopneumatic Pressure Low   Vasopneumatic Temperature  3*  PT Education - 07/07/16 1127    Education provided Yes   Education Details reviewed exercises to help donn/doff shoes and pants.    Person(s) Educated Patient   Methods Explanation;Demonstration   Comprehension Verbalized understanding;Returned demonstration             PT Long Term Goals - 07/07/16 1132      PT LONG TERM GOAL #1   Title I with advanced HEP to include a walking program (07/19/16)    Time 6   Status Partially Met     PT LONG TERM GOAL #2   Title ambulate without any gait deviations on even and uneven surfaces ( 07/19/16)    Time 6   Period Weeks   Status On-going     PT LONG TERM GOAL #3   Title demo Rt knee ROM -2 extension to 120 degrees flexion to assist with alternating steps ( 07/19/16)    Time 6   Period Weeks   Status Partially Met     PT LONG TERM GOAL #4   Title demo Rt LE strength =/ 5-/5 ( 07/19/16)    Time 6   Period Weeks    Status Partially Met     PT LONG TERM GOAL #5   Title improve FOTO =/< 36% limited, CJ level ( 07/19/16)    Time 6   Period Weeks   Status On-going     PT LONG TERM GOAL #6   Title report minimal to no pain in Rt knee to allow her to sleep per her previous level ( 07/19/16)    Time 6   Period Weeks   Status On-going               Plan - 07/07/16 1129    Clinical Impression Statement Pt's making great progress with ROM and tolerating exercises well reporting pain of only 1/10. Continue to progress toward LTG's.    Rehab Potential Excellent   PT Frequency 2x / week   PT Duration 6 weeks   PT Treatment/Interventions Moist Heat;Ultrasound;Therapeutic exercise;Dry needling;Taping;Vasopneumatic Device;Manual techniques;Neuromuscular re-education;Cryotherapy;Electrical Stimulation;Patient/family education   PT Next Visit Plan Continue progressive ROM/ strengthening for Rt knee, working on standing/sitting  hip flexion/abduction/knee flexion/df in figure 4 position.    PT Home Exercise Plan Continue HEP   Consulted and Agree with Plan of Care Patient      Patient will benefit from skilled therapeutic intervention in order to improve the following deficits and impairments:  Decreased range of motion, Difficulty walking, Pain, Increased edema, Decreased strength, Decreased mobility  Visit Diagnosis: Stiffness of right knee, not elsewhere classified  Pain and swelling of right lower leg  Other abnormalities of gait and mobility  Muscle weakness (generalized)     Problem List Patient Active Problem List   Diagnosis Date Noted  . Primary localized osteoarthritis of right knee 05/03/2016  . Primary osteoarthritis of right knee 05/03/2016    Robin Christensen, MPT 07/07/2016, 11:36 AM  [facility] [place], [place], [address] Phone: [phone]   Fax:  [phone]  Name: Robin Christensen MRN:  [id number] Date of Birth: Oct 06, 1950

## 2016-07-14 ENCOUNTER — Ambulatory Visit (INDEPENDENT_AMBULATORY_CARE_PROVIDER_SITE_OTHER): Payer: Commercial Managed Care - HMO | Admitting: Physical Therapy

## 2016-07-14 DIAGNOSIS — R2689 Other abnormalities of gait and mobility: Secondary | ICD-10-CM

## 2016-07-14 DIAGNOSIS — M25661 Stiffness of right knee, not elsewhere classified: Secondary | ICD-10-CM | POA: Diagnosis not present

## 2016-07-14 DIAGNOSIS — M7989 Other specified soft tissue disorders: Secondary | ICD-10-CM | POA: Diagnosis not present

## 2016-07-14 DIAGNOSIS — M79661 Pain in right lower leg: Secondary | ICD-10-CM

## 2016-07-14 NOTE — Therapy (Signed)
[place], [place], [address] Phone: [phone]   Fax:  [phone]  Physical Therapy Treatment  Patient Details  Name: Robin Christensen MRN: [id number] Date of Birth: 17-Apr-1951 Referring Provider: Dr. Melrose Nakayama   Encounter Date: 07/14/2016      PT End of Session - 07/14/16 0809    Visit Number 10   Number of Visits 12   Date for PT Re-Evaluation 07/19/16   PT Start Time 0804   PT Stop Time 0859   PT Time Calculation (min) 55 min   Activity Tolerance Patient tolerated treatment well   Behavior During Therapy [facility] for tasks assessed/performed      Past Medical History:  Diagnosis Date  . Cancer ([place])   . Dysrhythmia    AF  . Hyperlipidemia   . Hypertension   . Obesity   . PONV (postoperative nausea and vomiting)   . Sleep apnea    cpcap    Past Surgical History:  Procedure Laterality Date  . BREAST SURGERY    . CARDIOVERSION N/A 06/26/2014   Procedure: CARDIOVERSION;  Surgeon: Laverda Page, MD;  Location: [place];  Service: Cardiovascular;  Laterality: N/A;  . CHOLECYSTECTOMY    . 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      Subjective Assessment - 07/14/16 0809    Subjective Pt reports she is stiff, but her Rt knee is feeling more "normal".   She is having an easier time donning shoes since last visit.     Patient Stated Goals improve her stamina, get as much motion as she can in her knee.  Walk 2 miles for exercise. Get my shoes and pants on without difficutly.    Currently in Pain? No/denies   Pain Score 0-No pain            [facility] PT Assessment - 07/14/16 0001      Assessment   Medical Diagnosis Rt TKR   Referring Provider Dr. Melrose Nakayama    Onset Date/Surgical Date  05/03/16   Hand Dominance Right   Next MD Visit 07/23/16     Observation/Other Assessments   Focus on Therapeutic Outcomes (FOTO)  32% limited.      AROM   Right/Left Knee Right   Right Knee Extension -4  supine with quad set   Right Knee Flexion 110  supine with heel slide     Strength   Right Hip Flexion 5/5   Right Hip Extension 4+/5   Right Hip ABduction --  5-/5   Right Knee Flexion --  5-/5   Right Knee Extension 5/5                     [facility] Adult PT Treatment/Exercise - 07/14/16 0001      Knee/Hip Exercises: Stretches   Passive Hamstring Stretch Right;2 reps;30 seconds   Quad Stretch Right;2 reps;30 seconds   Gastroc Stretch Right;Left;2 reps;30 seconds   Soleus Stretch Right;Left;2 reps;30 seconds     Knee/Hip Exercises: Aerobic   Recumbent Bike L2: 5 min      Knee/Hip Exercises: Standing   Wall Squat 1 set;5 seconds  12 reps   Wall Squat Limitations VC to shift weight more to right.     SLS Multiple trials RLE, up  to 19 seconds. (up to 25 sec on LLE)   Other Standing Knee Exercises Rt lunge on 13" step, followed by hamstring stretch x 5 reps each direction.      Knee/Hip Exercises: Seated   Sit to Sand without UE support;15 reps  with pad under Lt foot to encourage increase wt in RLE>      Vasopneumatic   Number Minutes Vasopneumatic  15 minutes   Vasopnuematic Location  Knee   Vasopneumatic Pressure Low   Vasopneumatic Temperature  3*      Supine heel slides for ROM measurement.  Supine quad sets for ROM measurement.                 PT Long Term Goals - 07/14/16 9675      PT LONG TERM GOAL #1   Title I with advanced HEP to include a walking program (07/19/16)    Time 6   Period Weeks   Status Partially Met     PT LONG TERM GOAL #2   Title ambulate without any gait deviations on even and uneven surfaces ( 07/19/16)    Time 6   Period Weeks   Status Partially Met  not attempted uneven ground.      PT LONG TERM GOAL #3    Title demo Rt knee ROM -2 extension to 120 degrees flexion to assist with alternating steps ( 07/19/16)    Time 6   Period Weeks   Status Partially Met     PT LONG TERM GOAL #4   Title demo Rt LE strength =/ 5-/5 ( 07/19/16)    Time 6   Period Weeks   Status Partially Met     PT LONG TERM GOAL #5   Title improve FOTO =/< 36% limited, CJ level ( 07/19/16)    Time 6   Period Weeks   Status Achieved     PT LONG TERM GOAL #6   Title report minimal to no pain in Rt knee to allow her to sleep per her previous level ( 07/19/16)    Time 6   Period Weeks   Status Achieved  reported 07/14/16               Plan - 07/14/16 0927    Clinical Impression Statement Pt demonstrated increased stiffness in knee today; ROM in Rt knee slightly less than last visit.  Her strength in her RLE has improved.  She has partially met her goals and will benefit from continued PT intervention to maximize functional mobility independence.  Renewal sent to MD to cover her treatments until she has her follow up.    Rehab Potential Excellent   PT Frequency 2x / week   PT Duration 6 weeks   PT Treatment/Interventions Moist Heat;Ultrasound;Therapeutic exercise;Dry needling;Taping;Vasopneumatic Device;Manual techniques;Neuromuscular re-education;Cryotherapy;Electrical Stimulation;Patient/family education   PT Next Visit Plan Spoke to supervising PT regarding pt's progress.  Will request additional visits to assist pt in meeting established goals.    Consulted and Agree with Plan of Care Patient      Patient will benefit from skilled therapeutic intervention in order to improve the following deficits and impairments:  Decreased range of motion, Difficulty walking, Pain, Increased edema, Decreased strength, Decreased mobility  Visit Diagnosis: Stiffness of right knee, not elsewhere classified - Plan: PT plan of care cert/re-cert  Pain and swelling of right lower leg - Plan: PT plan of care cert/re-cert  Other  abnormalities of gait and mobility - Plan: PT plan of  care cert/re-cert     Problem List Patient Active Problem List   Diagnosis Date Noted  . Primary localized osteoarthritis of right knee 05/03/2016  . Primary osteoarthritis of right knee 05/03/2016   Kerin Perna, PTA 07/14/16 11:46 AM  [facility] [place], [place], [address] Phone: [phone]   Fax:  [phone]  Name: LIBERTI APPLETON MRN: [id number] Date of Birth: 1950/12/09  Jeral Pinch, PT 07/14/16 11:46 AM

## 2016-07-20 ENCOUNTER — Ambulatory Visit (INDEPENDENT_AMBULATORY_CARE_PROVIDER_SITE_OTHER): Payer: Commercial Managed Care - HMO | Admitting: Physical Therapy

## 2016-07-20 DIAGNOSIS — R2689 Other abnormalities of gait and mobility: Secondary | ICD-10-CM

## 2016-07-20 DIAGNOSIS — M7989 Other specified soft tissue disorders: Secondary | ICD-10-CM | POA: Diagnosis not present

## 2016-07-20 DIAGNOSIS — M79661 Pain in right lower leg: Secondary | ICD-10-CM

## 2016-07-20 DIAGNOSIS — M25661 Stiffness of right knee, not elsewhere classified: Secondary | ICD-10-CM

## 2016-07-20 NOTE — Therapy (Signed)
[place], [place], [address] Phone: [phone]   Fax:  [phone]  Physical Therapy Treatment  Patient Details  Name: Robin Christensen MRN: [id number] Date of Birth: 09/30/1950 Referring Provider: Dr. Melrose Nakayama  Encounter Date: 07/20/2016      PT End of Session - 07/20/16 0806    Visit Number 11   Number of Visits 12   Date for PT Re-Evaluation 07/19/16   PT Start Time 0800   PT Stop Time 0906   PT Time Calculation (min) 66 min   Activity Tolerance Patient tolerated treatment well   Behavior During Therapy [facility] for tasks assessed/performed      Past Medical History:  Diagnosis Date  . Cancer ([place])   . Dysrhythmia    AF  . Hyperlipidemia   . Hypertension   . Obesity   . PONV (postoperative nausea and vomiting)   . Sleep apnea    cpcap    Past Surgical History:  Procedure Laterality Date  . BREAST SURGERY    . CARDIOVERSION N/A 06/26/2014   Procedure: CARDIOVERSION;  Surgeon: Laverda Page, MD;  Location: [place];  Service: Cardiovascular;  Laterality: N/A;  . CHOLECYSTECTOMY    . 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      Subjective Assessment - 07/20/16 0806    Subjective Pt reports no new changes since last visit.  "I'm curious how straight my knee really is (what measurement will be)"   Pt returns to MD in 2 days.    Currently in Pain? No/denies   Pain Score 0-No pain   Pain Location Knee   Pain Orientation Right            [facility] PT Assessment - 07/20/16 0001      Assessment   Medical Diagnosis Rt TKR   Referring Provider Dr. Melrose Nakayama   Onset Date/Surgical Date 05/03/16   Hand Dominance Right   Next MD Visit 07/23/16     AROM   Right Knee Extension -1  supine with  quad set.    Right Knee Flexion 113  seated, while scooting forward.      PROM   Right Knee Extension 0     Strength   Right/Left Hip Right   Right Hip Flexion 5/5   Right Hip Extension --  5-/5   Right Hip ABduction 5/5   Right Knee Flexion --  5-/5   Right Knee Extension 5/5                     [facility] Adult PT Treatment/Exercise - 07/20/16 0001      Knee/Hip Exercises: Stretches   Passive Hamstring Stretch Right;2 reps;30 seconds   Quad Stretch Right;2 reps;30 seconds   Gastroc Stretch Right;2 reps;60 seconds;Left;30 seconds     Knee/Hip Exercises: Aerobic   Recumbent Bike L3: 3 min, L1: 2 min       Knee/Hip Exercises: Standing   Heel Raises Both;1 set;10 reps   Forward Step Up Right;1 set;10 reps;Hand Hold: 2;Step Height: 6"   Step Down Left;1 set;15 reps;Hand Hold: 2;Step Height: 6"  continues to be challenging.    Wall Squat 1 set;10 reps  with ball squeeze   SLS Multiple trials - up to 5 sec on RLE.  Other Standing Knee Exercises Rt TKE with ball behind knee x 10 sec x 10 reps    Other Standing Knee Exercises Rt/Lt toe taps to cup on 3" step x 10 reps each side, no UE support.      Knee/Hip Exercises: Seated   Other Seated Knee/Hip Exercises seated scoots with 10 sec hold x 5 reps      Knee/Hip Exercises: Supine   Quad Sets Right;1 set;5 reps  for measurement.    Heel Slides AROM;Right;1 set;5 reps  for measurement     Knee/Hip Exercises: Prone   Prone Knee Hang 2 minutes     Vasopneumatic   Number Minutes Vasopneumatic  15 minutes   Vasopnuematic Location  Knee   Vasopneumatic Pressure Low   Vasopneumatic Temperature  3*           PT Long Term Goals - 07/20/16 4503      PT LONG TERM GOAL #1   Title I with advanced HEP to include a walking program (07/19/16)    Time 6   Period Weeks   Status Achieved     PT LONG TERM GOAL #2   Title ambulate without any gait deviations on even and uneven surfaces ( 07/19/16)    Time 6   Period  Weeks   Status Achieved     PT LONG TERM GOAL #3   Title demo Rt knee ROM -2 extension to 120 degrees flexion to assist with alternating steps ( 07/19/16)    Time 6   Period Weeks   Status Partially Met     PT LONG TERM GOAL #4   Title demo Rt LE strength =/ 5-/5 ( 07/19/16)    Time 6   Period Weeks   Status Achieved     PT LONG TERM GOAL #5   Title improve FOTO =/< 36% limited, CJ level ( 07/19/16)    Time 6   Period Weeks   Status Achieved     PT LONG TERM GOAL #6   Title report minimal to no pain in Rt knee to allow her to sleep per her previous level ( 07/19/16)    Time 6   Period Weeks   Status Achieved               Plan - 07/20/16 0843    Clinical Impression Statement Pt tolerated all exercises well, with minimal increase in pain.  Rt knee ROM improved slightly in both flex and extension; she has partially met LTG#3.  she has met all other goals and requests to d/c to HEP at this time.    Rehab Potential Excellent   PT Frequency 2x / week   PT Duration 6 weeks   PT Treatment/Interventions Moist Heat;Ultrasound;Therapeutic exercise;Dry needling;Taping;Vasopneumatic Device;Manual techniques;Neuromuscular re-education;Cryotherapy;Electrical Stimulation;Patient/family education   PT Next Visit Plan Spoke to supervising PT; will d/c to HEP per pt request.    Consulted and Agree with Plan of Care Patient      Patient will benefit from skilled therapeutic intervention in order to improve the following deficits and impairments:  Decreased range of motion, Difficulty walking, Pain, Increased edema, Decreased strength, Decreased mobility  Visit Diagnosis: Stiffness of right knee, not elsewhere classified  Pain and swelling of right lower leg  Other abnormalities of gait and mobility     Problem List Patient Active Problem List   Diagnosis Date Noted  . Primary localized osteoarthritis of right knee 05/03/2016  . Primary osteoarthritis of right knee 05/03/2016  Kerin Perna, PTA 07/20/16 8:58 AM  [facility] [place], [place], [address] Phone: [phone]   Fax:  [phone]  Name: Robin Christensen MRN: [id number] Date of Birth: September 27, 1950  PHYSICAL THERAPY DISCHARGE SUMMARY  Visits from Start of Care: 11  Current functional level related to goals / functional outcomes: See above   Remaining deficits: Slight stiffness in kne   Education / Equipment: HEP Plan: Patient agrees to discharge.  Patient goals were partially met. Patient is being discharged due to being pleased with the current functional level.  ?????Doing well with HEP      Jeral Pinch, PT 07/20/16 10:38 AM

## 2016-07-22 ENCOUNTER — Encounter: Payer: Commercial Managed Care - HMO | Admitting: Physical Therapy

## 2016-07-22 DIAGNOSIS — Z96651 Presence of right artificial knee joint: Secondary | ICD-10-CM | POA: Diagnosis not present

## 2016-07-22 DIAGNOSIS — Z471 Aftercare following joint replacement surgery: Secondary | ICD-10-CM | POA: Diagnosis not present

## 2016-07-22 DIAGNOSIS — M1711 Unilateral primary osteoarthritis, right knee: Secondary | ICD-10-CM | POA: Diagnosis not present

## 2016-09-02 DIAGNOSIS — E785 Hyperlipidemia, unspecified: Secondary | ICD-10-CM | POA: Diagnosis not present

## 2016-09-02 DIAGNOSIS — L819 Disorder of pigmentation, unspecified: Secondary | ICD-10-CM | POA: Diagnosis not present

## 2016-09-02 DIAGNOSIS — M2550 Pain in unspecified joint: Secondary | ICD-10-CM | POA: Diagnosis not present

## 2016-09-02 DIAGNOSIS — E039 Hypothyroidism, unspecified: Secondary | ICD-10-CM | POA: Diagnosis not present

## 2016-09-02 DIAGNOSIS — R5383 Other fatigue: Secondary | ICD-10-CM | POA: Diagnosis not present

## 2016-09-02 DIAGNOSIS — I4891 Unspecified atrial fibrillation: Secondary | ICD-10-CM | POA: Diagnosis not present

## 2016-09-02 DIAGNOSIS — I10 Essential (primary) hypertension: Secondary | ICD-10-CM | POA: Diagnosis not present

## 2016-09-02 DIAGNOSIS — R739 Hyperglycemia, unspecified: Secondary | ICD-10-CM | POA: Diagnosis not present

## 2016-09-08 DIAGNOSIS — D1801 Hemangioma of skin and subcutaneous tissue: Secondary | ICD-10-CM | POA: Diagnosis not present

## 2016-09-08 DIAGNOSIS — D235 Other benign neoplasm of skin of trunk: Secondary | ICD-10-CM | POA: Diagnosis not present

## 2016-09-08 DIAGNOSIS — L821 Other seborrheic keratosis: Secondary | ICD-10-CM | POA: Diagnosis not present

## 2016-10-24 DIAGNOSIS — Z96651 Presence of right artificial knee joint: Secondary | ICD-10-CM | POA: Diagnosis not present

## 2016-10-24 DIAGNOSIS — M1712 Unilateral primary osteoarthritis, left knee: Secondary | ICD-10-CM | POA: Diagnosis not present

## 2016-11-01 DIAGNOSIS — Z86010 Personal history of colonic polyps: Secondary | ICD-10-CM | POA: Diagnosis not present

## 2016-11-01 DIAGNOSIS — Z7901 Long term (current) use of anticoagulants: Secondary | ICD-10-CM | POA: Diagnosis not present

## 2016-11-01 DIAGNOSIS — I4891 Unspecified atrial fibrillation: Secondary | ICD-10-CM | POA: Diagnosis not present

## 2016-11-01 DIAGNOSIS — K5900 Constipation, unspecified: Secondary | ICD-10-CM | POA: Diagnosis not present

## 2016-11-11 DIAGNOSIS — Z6834 Body mass index (BMI) 34.0-34.9, adult: Secondary | ICD-10-CM | POA: Diagnosis not present

## 2016-11-11 DIAGNOSIS — I480 Paroxysmal atrial fibrillation: Secondary | ICD-10-CM | POA: Diagnosis not present

## 2016-11-14 DIAGNOSIS — N951 Menopausal and female climacteric states: Secondary | ICD-10-CM | POA: Diagnosis not present

## 2016-11-14 DIAGNOSIS — Z0000 Encounter for general adult medical examination without abnormal findings: Secondary | ICD-10-CM | POA: Diagnosis not present

## 2016-11-14 DIAGNOSIS — E785 Hyperlipidemia, unspecified: Secondary | ICD-10-CM | POA: Diagnosis not present

## 2016-11-14 DIAGNOSIS — Z13220 Encounter for screening for lipoid disorders: Secondary | ICD-10-CM | POA: Diagnosis not present

## 2016-11-14 DIAGNOSIS — I4891 Unspecified atrial fibrillation: Secondary | ICD-10-CM | POA: Diagnosis not present

## 2016-11-14 DIAGNOSIS — I10 Essential (primary) hypertension: Secondary | ICD-10-CM | POA: Diagnosis not present

## 2016-12-05 DIAGNOSIS — G4733 Obstructive sleep apnea (adult) (pediatric): Secondary | ICD-10-CM | POA: Diagnosis not present

## 2016-12-05 DIAGNOSIS — G4734 Idiopathic sleep related nonobstructive alveolar hypoventilation: Secondary | ICD-10-CM | POA: Diagnosis not present

## 2016-12-07 DIAGNOSIS — Z86010 Personal history of colonic polyps: Secondary | ICD-10-CM | POA: Diagnosis not present

## 2016-12-07 DIAGNOSIS — K640 First degree hemorrhoids: Secondary | ICD-10-CM | POA: Diagnosis not present

## 2016-12-26 DIAGNOSIS — Z780 Asymptomatic menopausal state: Secondary | ICD-10-CM | POA: Diagnosis not present

## 2016-12-26 DIAGNOSIS — M8588 Other specified disorders of bone density and structure, other site: Secondary | ICD-10-CM | POA: Diagnosis not present

## 2016-12-28 DIAGNOSIS — G4733 Obstructive sleep apnea (adult) (pediatric): Secondary | ICD-10-CM | POA: Diagnosis not present

## 2017-01-16 DIAGNOSIS — M1712 Unilateral primary osteoarthritis, left knee: Secondary | ICD-10-CM | POA: Diagnosis not present

## 2017-02-06 DIAGNOSIS — G4733 Obstructive sleep apnea (adult) (pediatric): Secondary | ICD-10-CM | POA: Diagnosis not present

## 2017-03-17 DIAGNOSIS — H5210 Myopia, unspecified eye: Secondary | ICD-10-CM | POA: Diagnosis not present

## 2017-03-28 DIAGNOSIS — H02833 Dermatochalasis of right eye, unspecified eyelid: Secondary | ICD-10-CM | POA: Diagnosis not present

## 2017-03-28 DIAGNOSIS — H2512 Age-related nuclear cataract, left eye: Secondary | ICD-10-CM | POA: Diagnosis not present

## 2017-03-28 DIAGNOSIS — H2513 Age-related nuclear cataract, bilateral: Secondary | ICD-10-CM | POA: Diagnosis not present

## 2017-03-28 DIAGNOSIS — H2511 Age-related nuclear cataract, right eye: Secondary | ICD-10-CM | POA: Diagnosis not present

## 2017-03-29 DIAGNOSIS — H2511 Age-related nuclear cataract, right eye: Secondary | ICD-10-CM | POA: Diagnosis not present

## 2017-03-31 DIAGNOSIS — H2511 Age-related nuclear cataract, right eye: Secondary | ICD-10-CM | POA: Diagnosis not present

## 2017-04-12 DIAGNOSIS — H2512 Age-related nuclear cataract, left eye: Secondary | ICD-10-CM | POA: Diagnosis not present

## 2017-04-12 DIAGNOSIS — H25812 Combined forms of age-related cataract, left eye: Secondary | ICD-10-CM | POA: Diagnosis not present

## 2017-04-26 DIAGNOSIS — M1712 Unilateral primary osteoarthritis, left knee: Secondary | ICD-10-CM | POA: Diagnosis not present

## 2017-04-26 DIAGNOSIS — Z96651 Presence of right artificial knee joint: Secondary | ICD-10-CM | POA: Diagnosis not present

## 2017-05-08 DIAGNOSIS — Z1231 Encounter for screening mammogram for malignant neoplasm of breast: Secondary | ICD-10-CM | POA: Diagnosis not present

## 2017-05-08 DIAGNOSIS — Z853 Personal history of malignant neoplasm of breast: Secondary | ICD-10-CM | POA: Diagnosis not present

## 2017-05-09 DIAGNOSIS — Z0100 Encounter for examination of eyes and vision without abnormal findings: Secondary | ICD-10-CM | POA: Diagnosis not present

## 2017-05-16 ENCOUNTER — Encounter: Payer: Self-pay | Admitting: Neurology

## 2017-05-16 DIAGNOSIS — E785 Hyperlipidemia, unspecified: Secondary | ICD-10-CM | POA: Diagnosis not present

## 2017-05-16 DIAGNOSIS — E039 Hypothyroidism, unspecified: Secondary | ICD-10-CM | POA: Diagnosis not present

## 2017-05-16 DIAGNOSIS — I4891 Unspecified atrial fibrillation: Secondary | ICD-10-CM | POA: Diagnosis not present

## 2017-05-16 DIAGNOSIS — L989 Disorder of the skin and subcutaneous tissue, unspecified: Secondary | ICD-10-CM | POA: Diagnosis not present

## 2017-05-16 DIAGNOSIS — K5900 Constipation, unspecified: Secondary | ICD-10-CM | POA: Diagnosis not present

## 2017-05-16 DIAGNOSIS — R51 Headache: Secondary | ICD-10-CM | POA: Diagnosis not present

## 2017-05-16 DIAGNOSIS — Z23 Encounter for immunization: Secondary | ICD-10-CM | POA: Diagnosis not present

## 2017-05-17 DIAGNOSIS — H02413 Mechanical ptosis of bilateral eyelids: Secondary | ICD-10-CM | POA: Diagnosis not present

## 2017-05-22 DIAGNOSIS — H02413 Mechanical ptosis of bilateral eyelids: Secondary | ICD-10-CM | POA: Diagnosis not present

## 2017-06-12 DIAGNOSIS — H02834 Dermatochalasis of left upper eyelid: Secondary | ICD-10-CM | POA: Diagnosis not present

## 2017-06-12 DIAGNOSIS — H02413 Mechanical ptosis of bilateral eyelids: Secondary | ICD-10-CM | POA: Diagnosis not present

## 2017-06-12 DIAGNOSIS — H02831 Dermatochalasis of right upper eyelid: Secondary | ICD-10-CM | POA: Diagnosis not present

## 2017-08-07 DIAGNOSIS — M1712 Unilateral primary osteoarthritis, left knee: Secondary | ICD-10-CM | POA: Diagnosis not present

## 2017-08-11 DIAGNOSIS — R509 Fever, unspecified: Secondary | ICD-10-CM | POA: Diagnosis not present

## 2017-08-11 DIAGNOSIS — J329 Chronic sinusitis, unspecified: Secondary | ICD-10-CM | POA: Diagnosis not present

## 2017-08-29 ENCOUNTER — Ambulatory Visit: Payer: Commercial Managed Care - HMO | Admitting: Neurology

## 2017-10-07 IMAGING — US US TRANSVAGINAL NON-OB
1 series · 14 of 25 positions shown · non-contrast
Comparison: None

CLINICAL DATA: Right abdominal and pelvic pain with pressure

EXAM:
TRANSABDOMINAL AND TRANSVAGINAL ULTRASOUND OF PELVIS
TECHNIQUE: Both transabdominal and transvaginal ultrasound examinations of the
pelvis were performed. Transabdominal technique was performed for
global imaging of the pelvis including uterus, ovaries, adnexal
regions, and pelvic cul-de-sac. It was necessary to proceed with
endovaginal exam following the transabdominal exam to visualize the
ovaries and endometrium.

[Series 1: us transvaginal non-ob · 0.24mm/px · 14 of 39 slices shown]
[im 1/39]
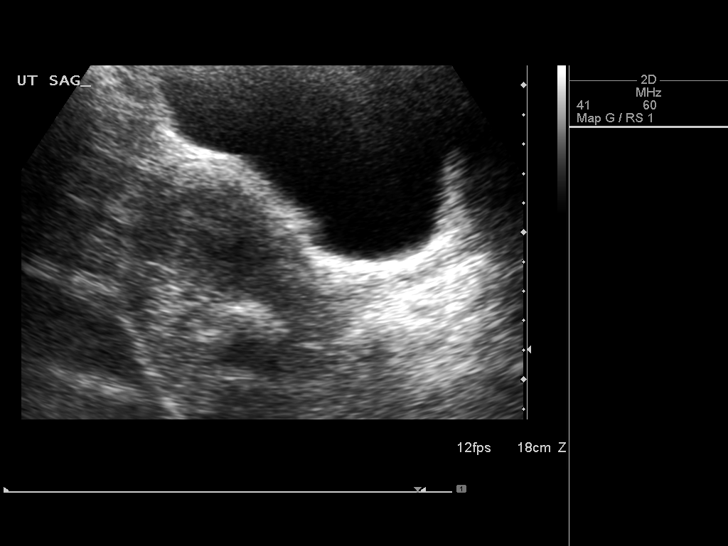
[im 4/39]
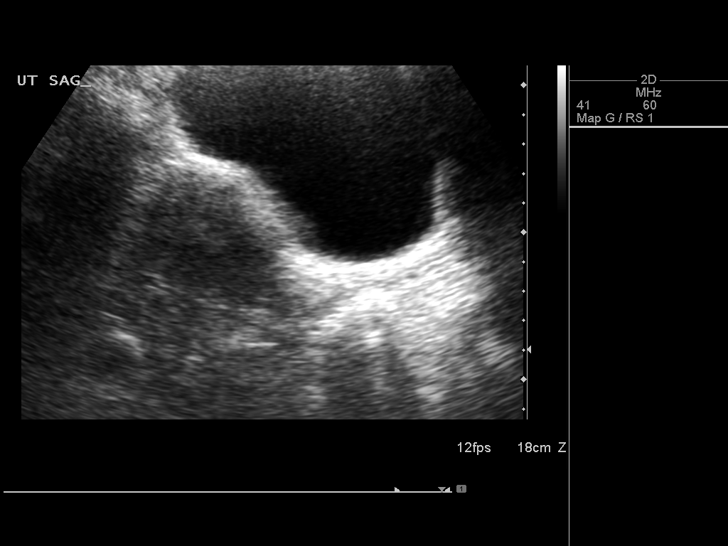
[im 7/39]
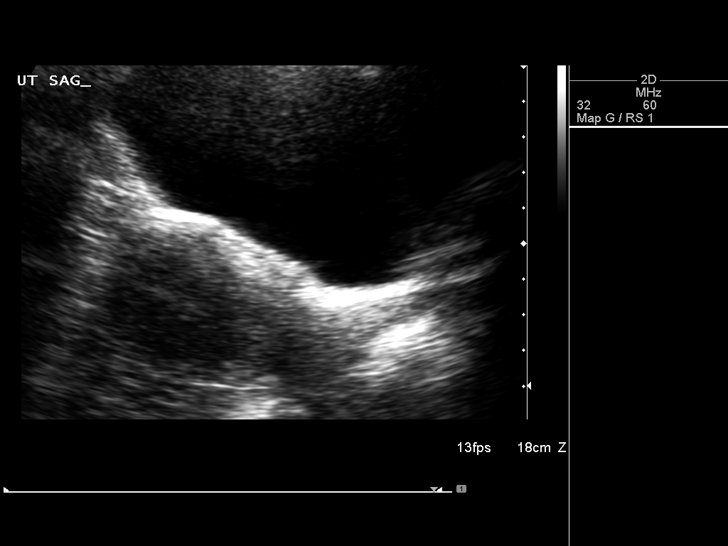
[im 10/39]
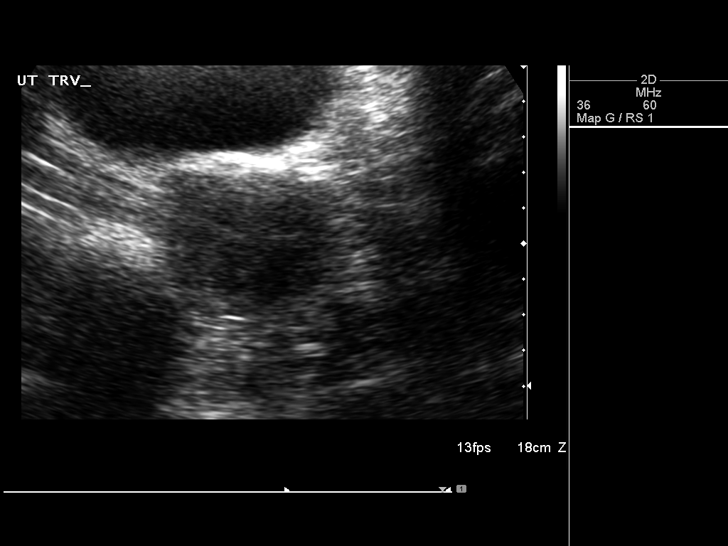
[im 13/39]
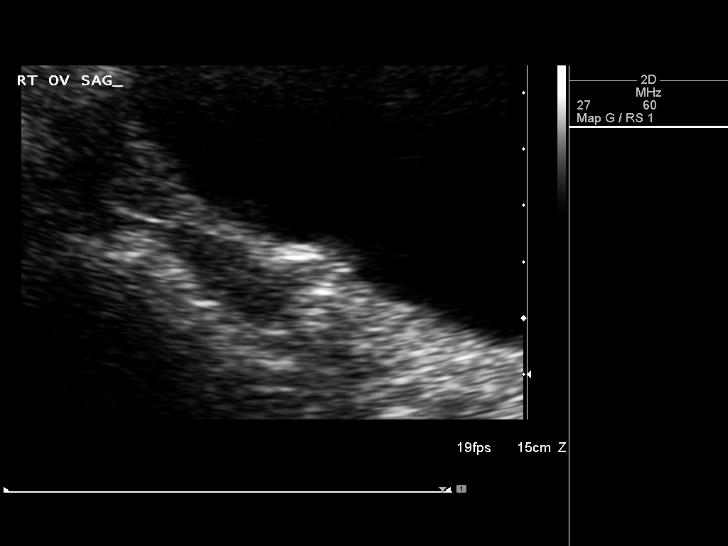
[im 15/39]
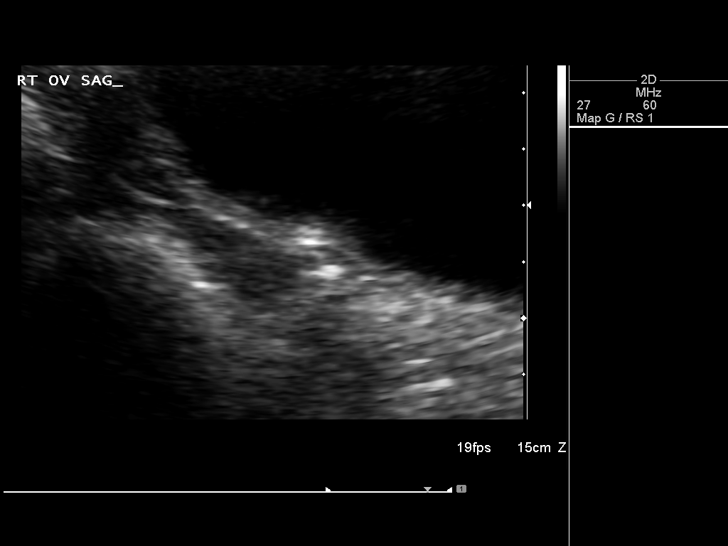
[im 18/39]
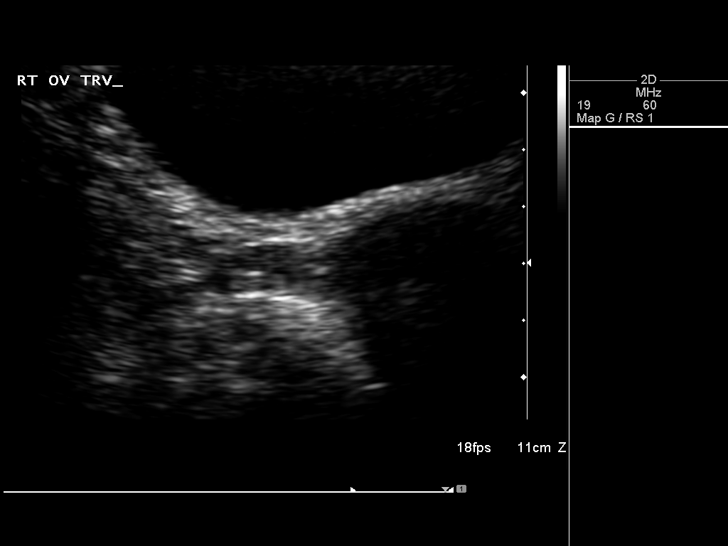
[im 21/39]
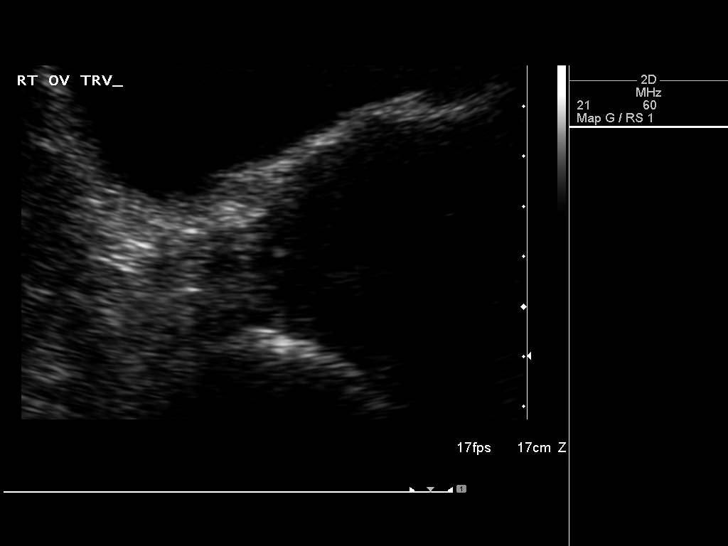
[im 24/39]
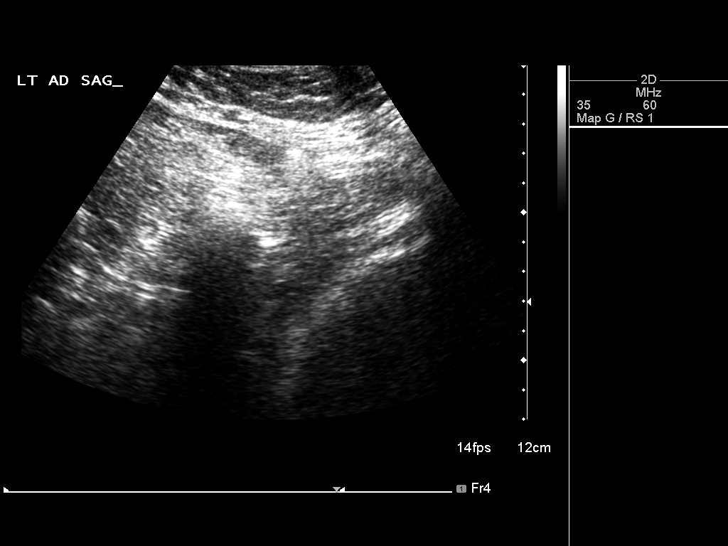
[im 26/39]
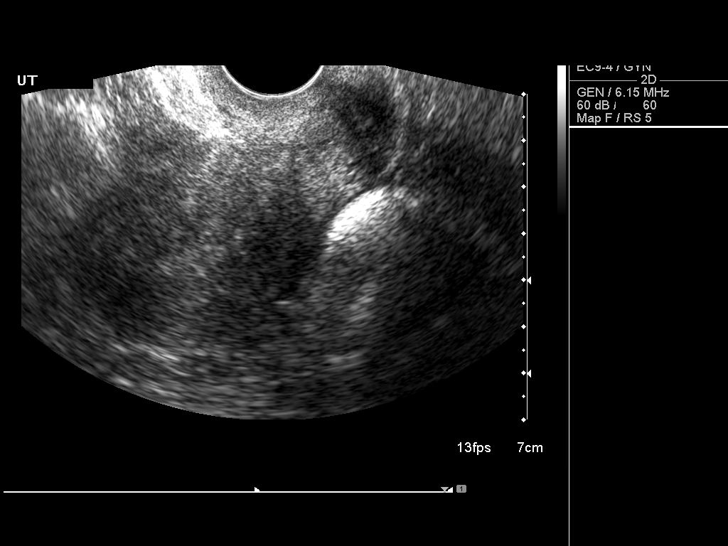
[im 29/39]
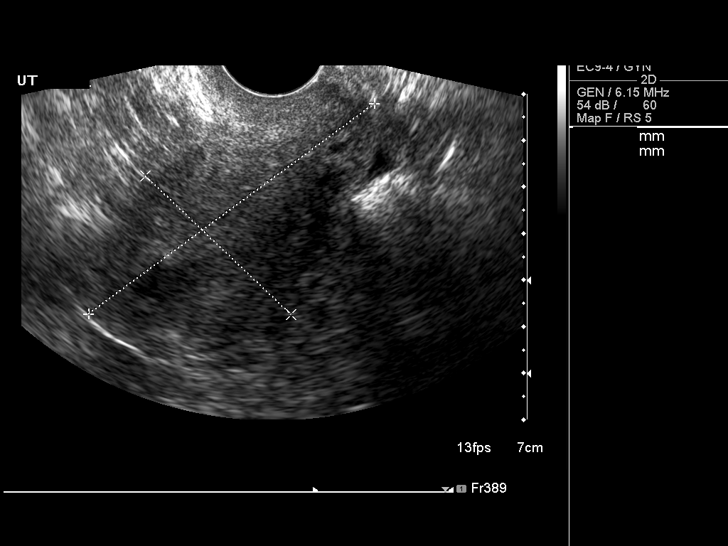
[im 32/39]
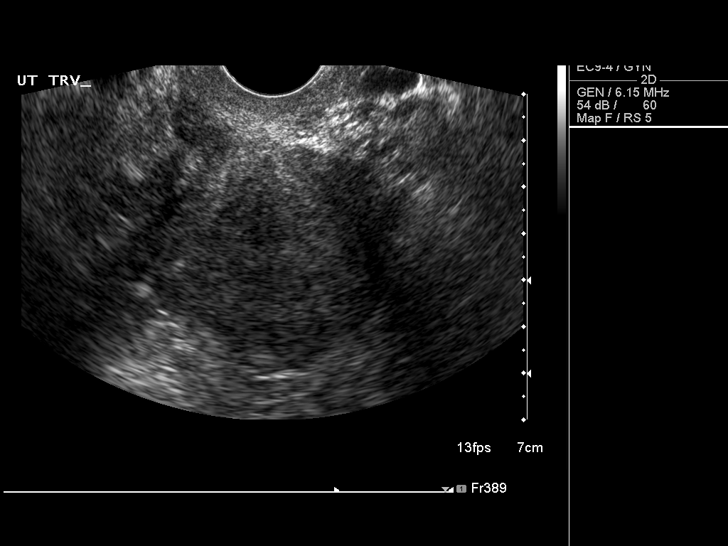
[im 35/39]
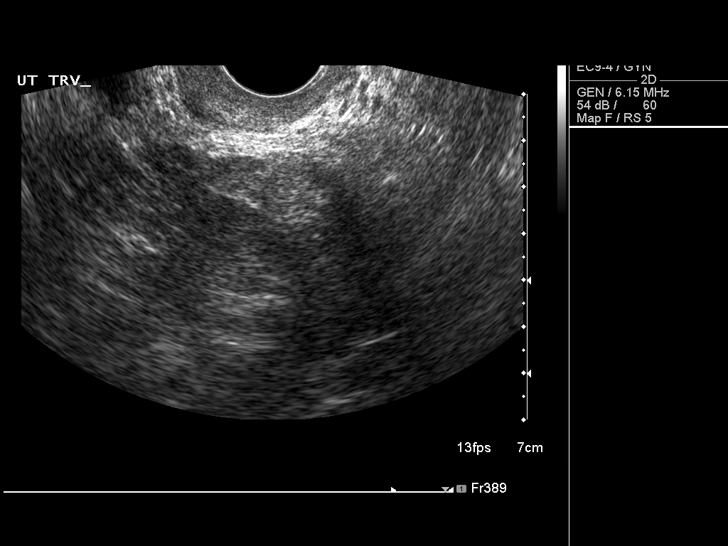
[im 39/39]
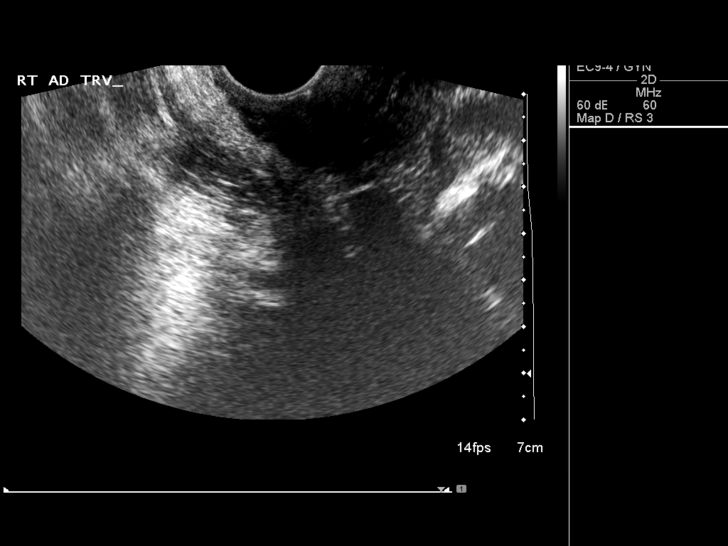

[14 of 25 positions shown; findings below may reference images not displayed]

FINDINGS: Uterus

Measurements: 8 x 4 x 5 cm. No fibroids or other mass visualized.

Endometrium

Thickness: 4 mm.  No focal abnormality visualized.

Right ovary

Measurements: 24 x 11 x 17 mm. Normal appearance/no adnexal mass.

Left ovary

Could not be visualized.

Other findings

No abnormal free fluid.
IMPRESSION: 1. No explanation for pain. Normal appearance of the uterus and
right ovary.
2. Nonvisualized left ovary.

## 2017-10-07 IMAGING — US US ABDOMEN COMPLETE
1 series · 14 of 25 positions shown · non-contrast
Comparison: None.

CLINICAL DATA: Right abdominal pain and pressure for 6 weeks

EXAM:
ABDOMEN ULTRASOUND COMPLETE

[Series 1: us abdomen complete · 0.31mm/px · 14 of 76 slices shown]
[im 1/76]
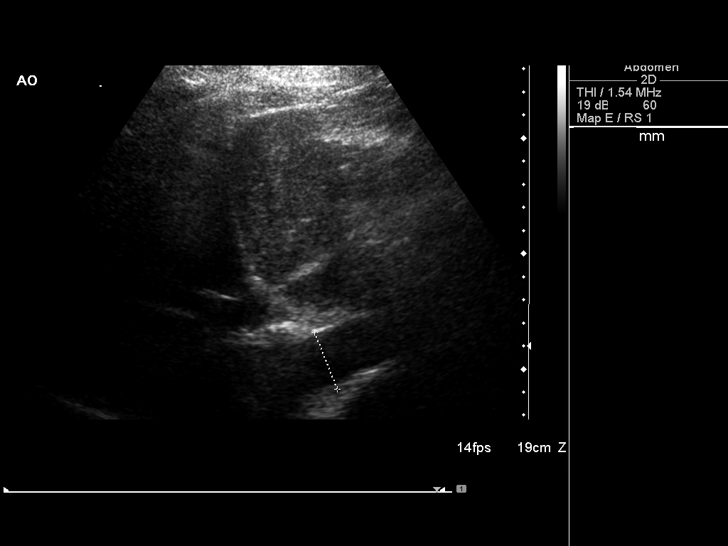
[im 7/76]
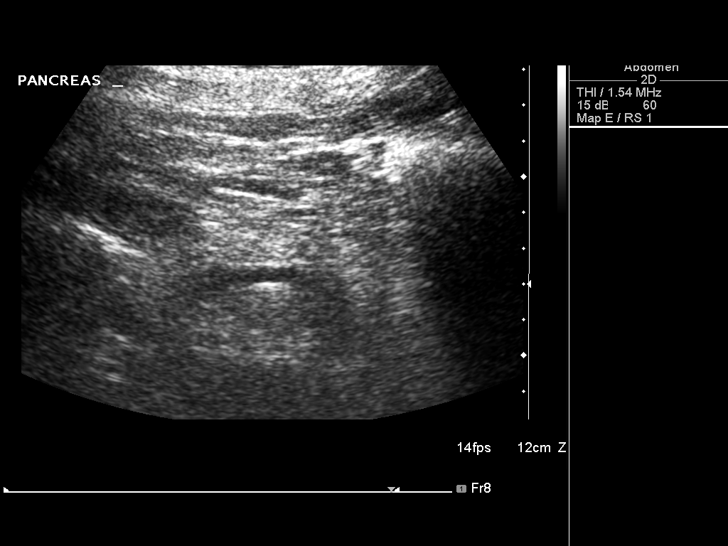
[im 13/76]
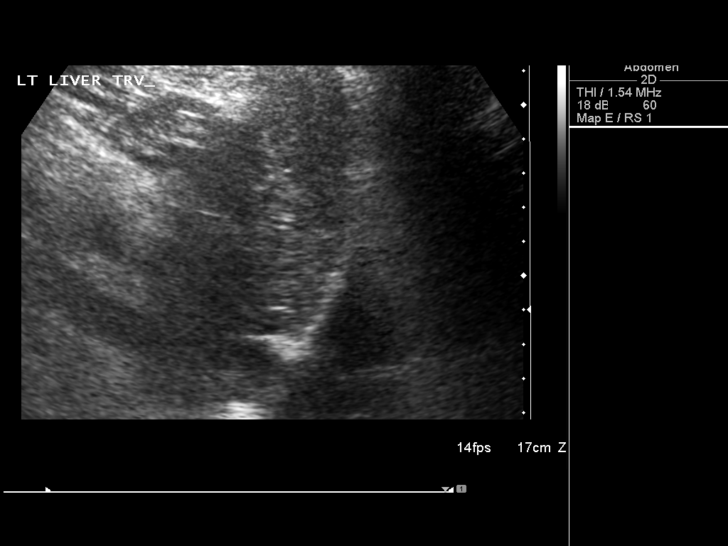
[im 19/76]
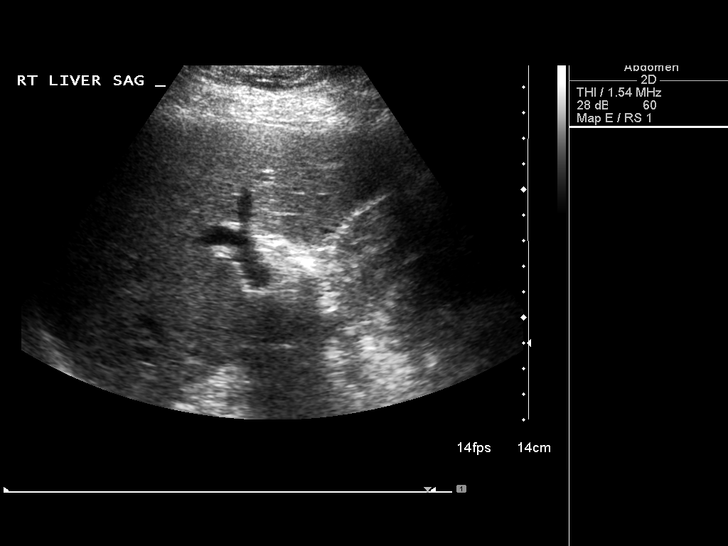
[im 26/76]
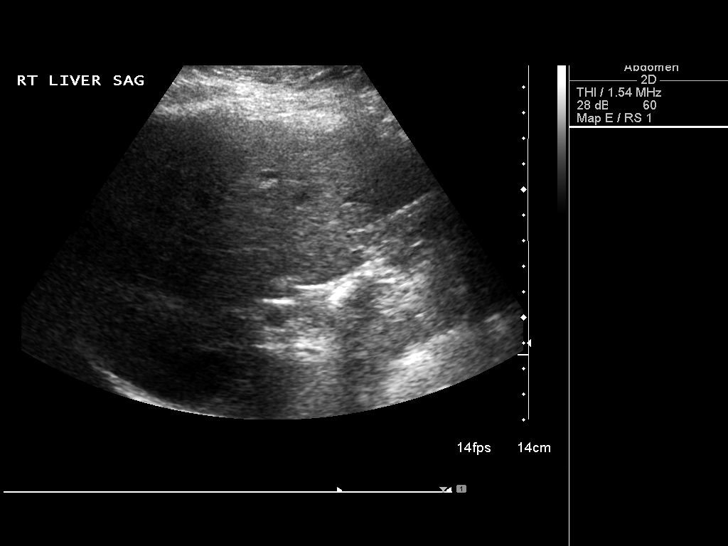
[im 29/76]
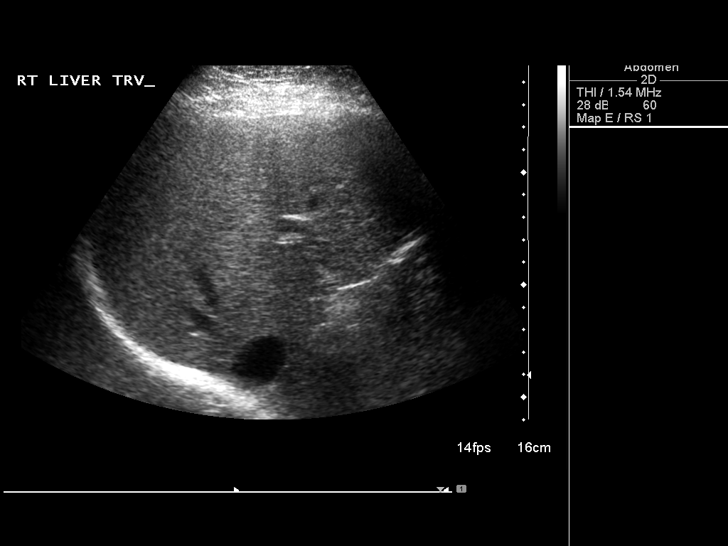
[im 35/76]
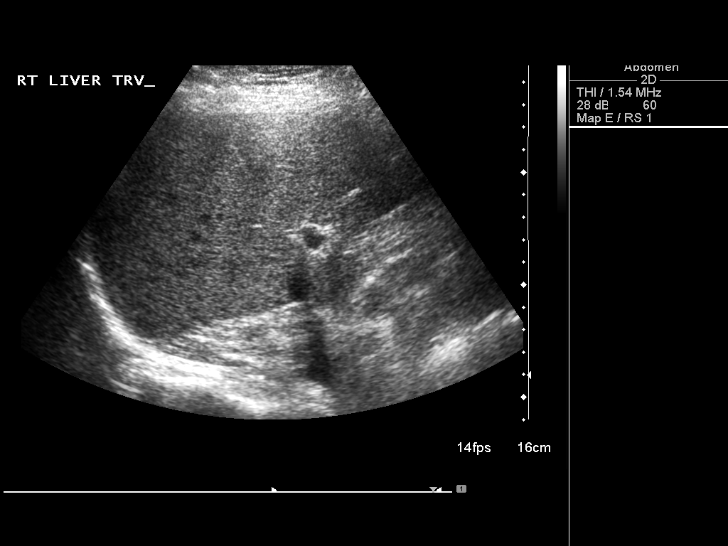
[im 41/76]
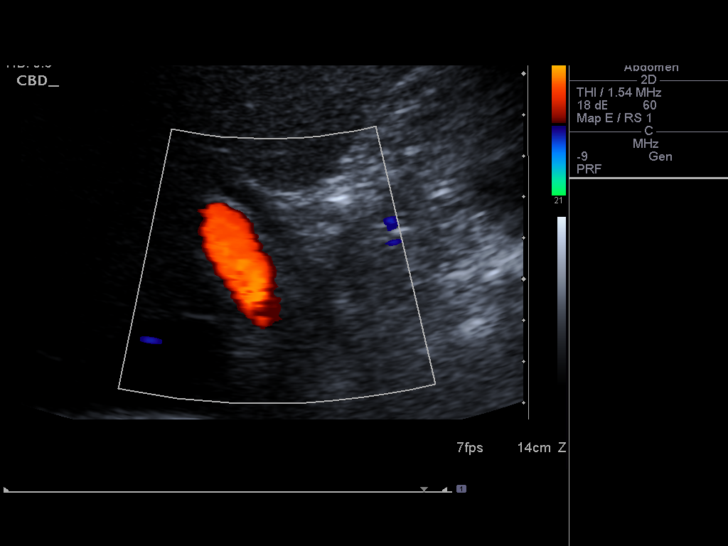
[im 47/76]
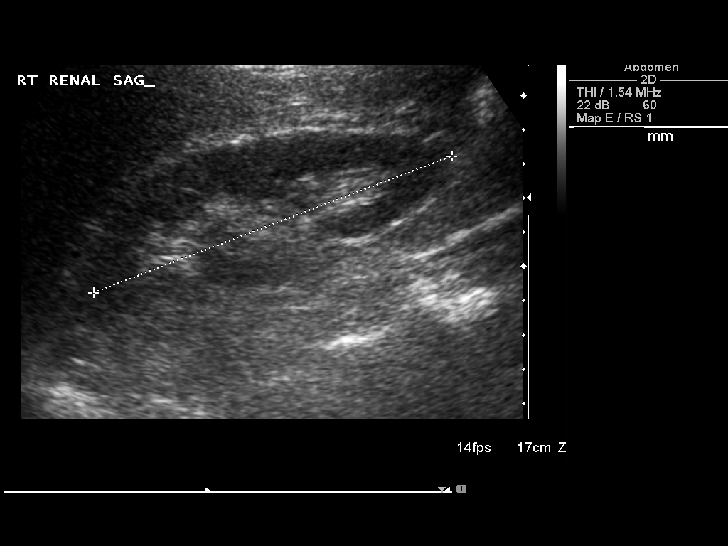
[im 51/76]
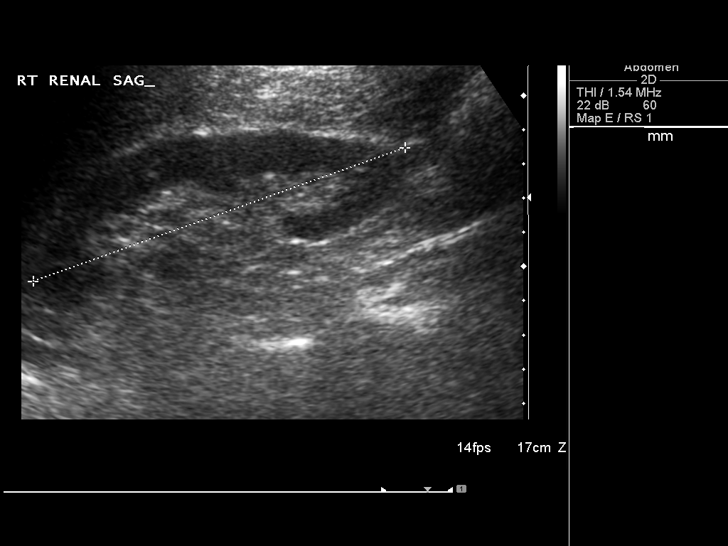
[im 57/76]
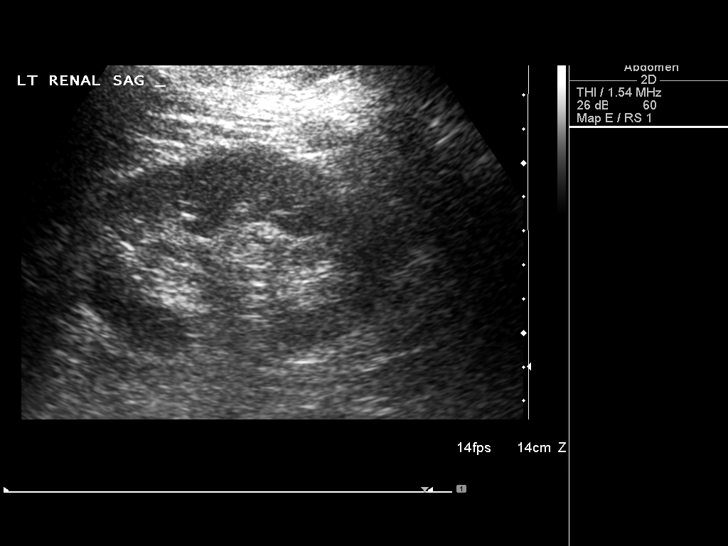
[im 63/76]
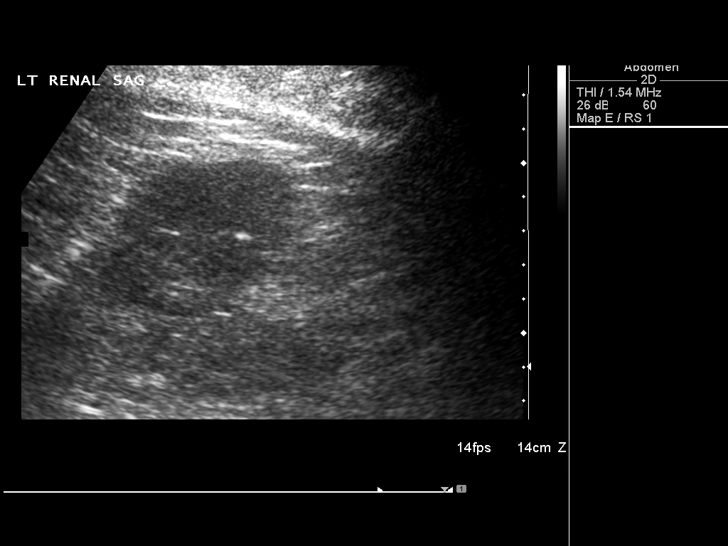
[im 69/76]
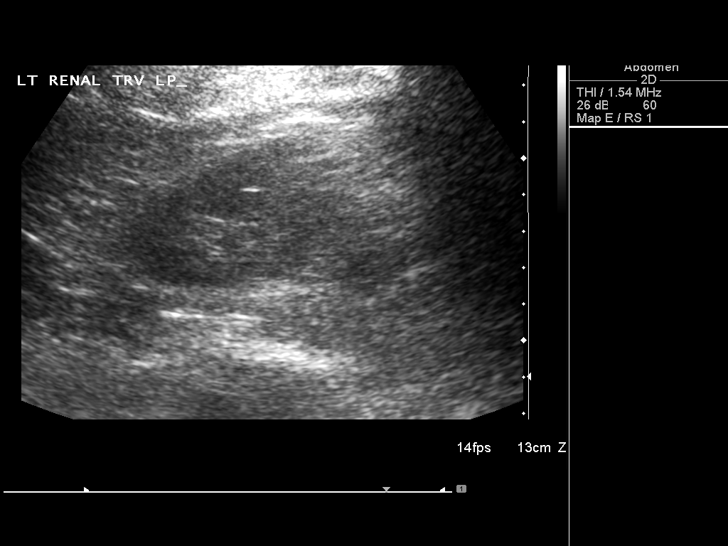
[im 76/76]
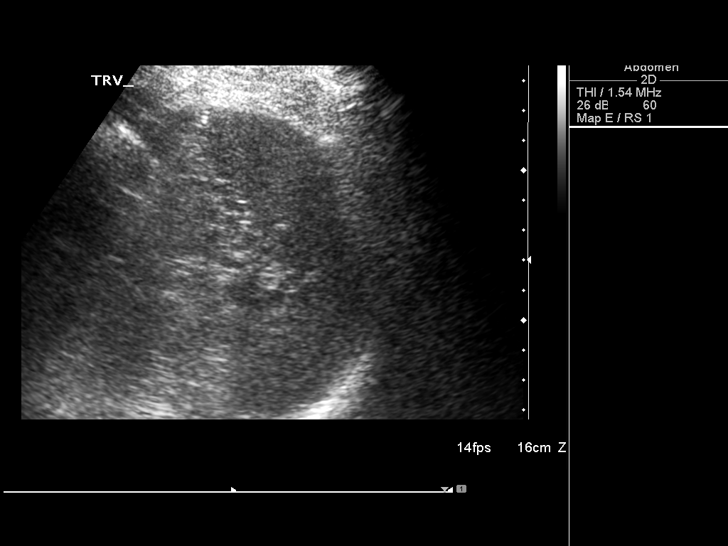

[14 of 25 positions shown; findings below may reference images not displayed]

FINDINGS: Gallbladder: Surgically absent

Common bile duct: Diameter: 5.6 mm in diameter within normal limits

Liver: No focal lesion identified. Within normal limits in
parenchymal echogenicity.

IVC: No abnormality visualized.

Pancreas: Visualized portion unremarkable.

Spleen: Size and appearance within normal limits. Measures 6.4 cm in
length.

Right Kidney: Length: 11.6 cm. Echogenicity within normal limits. No
mass or hydronephrosis visualized.

Left Kidney: Length: 10.7 cm. Echogenicity within normal limits. No
mass or hydronephrosis visualized.

Abdominal aorta: No aneurysm visualized. Measures up to 2.7 cm in
diameter.

Other findings: None.
IMPRESSION: 1. Surgical absent gallbladder.  Normal CBD.
2. No hydronephrosis.
3. No aortic aneurysm.

## 2017-10-24 DIAGNOSIS — M5441 Lumbago with sciatica, right side: Secondary | ICD-10-CM | POA: Diagnosis not present

## 2017-10-24 DIAGNOSIS — M1712 Unilateral primary osteoarthritis, left knee: Secondary | ICD-10-CM | POA: Diagnosis not present

## 2017-10-27 ENCOUNTER — Ambulatory Visit: Payer: Medicare HMO | Admitting: Physical Therapy

## 2017-10-27 ENCOUNTER — Encounter: Payer: Self-pay | Admitting: Physical Therapy

## 2017-10-27 DIAGNOSIS — M6281 Muscle weakness (generalized): Secondary | ICD-10-CM | POA: Diagnosis not present

## 2017-10-27 DIAGNOSIS — G8929 Other chronic pain: Secondary | ICD-10-CM

## 2017-10-27 DIAGNOSIS — M5441 Lumbago with sciatica, right side: Secondary | ICD-10-CM | POA: Diagnosis not present

## 2017-10-27 DIAGNOSIS — M62830 Muscle spasm of back: Secondary | ICD-10-CM

## 2017-10-27 NOTE — Therapy (Signed)
[facility], [place], [address] Phone: [phone]   Fax:  [phone]  Physical Therapy Evaluation  Patient Details  Name: Robin Christensen MRN: [id number] Date of Birth: August 29, 1950 Referring Provider: Dr Melrose Nakayama   Encounter Date: 10/27/2017  PT End of Session - 10/27/17 1340    Visit Number  1    Number of Visits  8    Date for PT Re-Evaluation  11/24/17    PT Start Time  1341    PT Stop Time  1450    PT Time Calculation (min)  69 min    Activity Tolerance  Patient limited by pain       Past Medical History:  Diagnosis Date  . Cancer ([place])   . Dysrhythmia    AF  . Hyperlipidemia   . Hypertension   . Obesity   . PONV (postoperative nausea and vomiting)   . Sleep apnea    cpcap    Past Surgical History:  Procedure Laterality Date  . BREAST SURGERY    . CARDIOVERSION N/A 06/26/2014   Procedure: CARDIOVERSION;  Surgeon: Laverda Page, MD;  Location: [facility];  Service: Cardiovascular;  Laterality: N/A;  . CHOLECYSTECTOMY    . TOTAL KNEE ARTHROPLASTY Right 05/03/2016   Procedure: TOTAL KNEE ARTHROPLASTY;  Surgeon: Melrose Nakayama, MD;  Location: [facility];  Service: Orthopedics;  Laterality: Right;    There were no vitals filed for this visit.   Subjective Assessment - 10/27/17 1341    Subjective  Pat reports her low back started end of last year, she would use OTC med and it would get better. About 2-3 wks ago the pain intensified and was interruptting her sleep.  Pain was in the R tbuttocks and throb down to the ankle. On prednisone for 6 days, has two more days.  Gets relief with walking and she does this daily.     Pertinent History   Breast CA 1992, Rt TKA 04/2016, going to have Lt TKA probably in Aug.     How long can you sit comfortably?  20'    How long can you walk comfortably?  no difficulty    Diagnostic tests  x-rays curve in low back and DDD in lumbar spine     Patient Stated Goals  Get exercises to strengthen the back. Play on her computer without pain. Tolerate flight to [place] beginning of May    Currently in Pain?  No/denies worse with  sitting         [facility] PT Assessment - 10/27/17 0001      Assessment   Medical Diagnosis  Rt lumbar radiculpathy    Referring Provider  Dr Melrose Nakayama    Onset Date/Surgical Date  10/06/17    Hand Dominance  Right    Next MD Visit  11/23/17    Prior Therapy  not for back      Precautions   Precautions  None      Balance Screen   Has the patient fallen in the past 6 months  No      [place] residence    Home Layout  Two level some toruble ascending stairs d/t pain      Prior Function   Level of [place]  Retired    Leisure  travel, play games on computer, walk dog and shop      Observation/Other Assessments  Focus on Therapeutic Outcomes (FOTO)   43% limited      Functional Tests   Functional tests  Squat      Squat   Comments  WNL for mini squat      Posture/Postural Control   Posture/Postural Control  Postural limitations    Postural Limitations  Decreased lumbar lordosis;Rounded Shoulders;Forward head posterior lean, Rt shoulder complex elevated      ROM / Strength   AROM / PROM / Strength  AROM;Strength      AROM   AROM Assessment Site  Lumbar    Lumbar Flexion  to floor,pain with return to stand    Lumbar Extension  75% present with some Rt buttock pain    Lumbar - Right Side Bend  WNL    Lumbar - Left Side Bend  75% present with pain    Lumbar - Right Rotation  75% present with pain    Lumbar - Left Rotation  WNL      Strength   Strength Assessment Site  Hip;Knee;Ankle;Lumbar    Right/Left Hip  -- Lt WNL except hip ext 4/5, Rt grossly 4+/5    Right/Left Knee  -- WNL    Right/Left Ankle  -- WNL    Lumbar Flexion  -- poor TA contaction    Lumbar Extension  -- multifidi lumbar fair      Palpation   Spinal  mobility  hypomobile in lower lumbar spine and sacrum CPA and bilat UPA Rt more tender than left.     Palpation comment  tight in bilat gluts, piriformis and QLs Rt more so than Lt very tender on the Rt side.                 Objective measurements completed on examination: See above findings.      [facility] Adult PT Treatment/Exercise - 10/27/17 0001      Exercises   Exercises  Lumbar      Lumbar Exercises: Stretches   Single Knee to Chest Stretch  Left;Right;20 seconds    Double Knee to Chest Stretch  20 seconds    Lower Trunk Rotation  -- 10 reps x 2-3 sec    Figure 4 Stretch  30 seconds;With overpressure bilat      Modalities   Modalities  Electrical Stimulation;Moist Heat      Moist Heat Therapy   Number Minutes Moist Heat  20 Minutes    Moist Heat Location  Lumbar Spine buttocks      Electrical Stimulation   Electrical Stimulation Location  lumbar and Rt buttocks    Electrical Stimulation Action  IFC    Electrical Stimulation Parameters  to tolerance    Electrical Stimulation Goals  Pain;Tone             PT Education - 10/27/17 1430    Education provided  Yes    Education Details  HEP     Person(s) Educated  Patient    Methods  Explanation;Demonstration;Handout    Comprehension  Returned demonstration;Verbalized understanding          PT Long Term Goals - 10/27/17 1339      PT LONG TERM GOAL #1   Title  I with advanced HEP to include a walking program (11/24/17)     Time  4    Period  Weeks    Status  New      PT LONG TERM GOAL #2   Title  improve FOTO =/< 34% limited ( 11/24/17)  Time  4    Period  Weeks    Status  New      PT LONG TERM GOAL #3   Title  report =/> 75% reduction of Rt buttock pain with sitting to prepare her for her flight to [place] ( 11/24/17)     Time  4    Period  Weeks    Status  New      PT LONG TERM GOAL #4   Title  demo Rt hip strength =/ 5-/5 ( 11/24/17)     Time  4    Period  Weeks    Status  New      PT  LONG TERM GOAL #5   Title  tolerate core strengthening with good form ( 11/24/17)     Time  4    Period  Weeks    Status  New             Plan - 10/27/17 1435    Clinical Impression Statement  67 yo female presents with Rt buttock and low back pain that occassionally goes down her leg. She is very tender in her Rt buttocks and lumbar paraspinals.  Has a little tenderness in the left side. She is hypomobile and painful with CPA and bilat UPA mobs L3-5 and sacrum.  She has significant weakness in her core and some in her Rt hip.     History and Personal Factors relevant to plan of care:  Rt TKA, breast CA 1992, large weight loss    Clinical Presentation  Stable    Clinical Decision Making  Low    Rehab Potential  Good    PT Frequency  2x / week    PT Duration  4 weeks    PT Treatment/Interventions  Iontophoresis 4mg /ml Dexamethasone;Dry needling;Manual techniques;Moist Heat;Therapeutic activities;Patient/family education;Taping;Therapeutic exercise;Cryotherapy;Electrical Stimulation;Ultrasound    PT Next Visit Plan  manual work to bilat buttocks and low back, core and hip strengthening, modalities PRN.  If she is still tight and wants DN then schedule that     Consulted and Agree with Plan of Care  Patient       Patient will benefit from skilled therapeutic intervention in order to improve the following deficits and impairments:  Pain, Increased muscle spasms, Decreased strength, Decreased range of motion  Visit Diagnosis: Chronic right-sided low back pain with right-sided sciatica - Plan: PT plan of care cert/re-cert  Muscle weakness (generalized) - Plan: PT plan of care cert/re-cert  Muscle spasm of back - Plan: PT plan of care cert/re-cert     Problem List Patient Active Problem List   Diagnosis Date Noted  . Primary localized osteoarthritis of right knee 05/03/2016  . Primary osteoarthritis of right knee 05/03/2016    Jeral Pinch PT  10/27/2017, 2:45 PM  [facility] [place], [place], [address] Phone: [phone]   Fax:  [phone]  Name: Robin Christensen MRN: [id number] Date of Birth: April 22, 1951

## 2017-10-27 NOTE — Patient Instructions (Addendum)
Self trigger point release with ball on wall.  Place ball on the wall , lean into it and find a tender spot.  Then roll around that area for 20-30 sec, Look for more tender spots and roll.

## 2017-11-06 ENCOUNTER — Ambulatory Visit: Payer: Medicare HMO | Admitting: Physical Therapy

## 2017-11-06 ENCOUNTER — Encounter: Payer: Self-pay | Admitting: Physical Therapy

## 2017-11-06 DIAGNOSIS — G8929 Other chronic pain: Secondary | ICD-10-CM

## 2017-11-06 DIAGNOSIS — M5441 Lumbago with sciatica, right side: Secondary | ICD-10-CM | POA: Diagnosis not present

## 2017-11-06 DIAGNOSIS — M62830 Muscle spasm of back: Secondary | ICD-10-CM | POA: Diagnosis not present

## 2017-11-06 DIAGNOSIS — M6281 Muscle weakness (generalized): Secondary | ICD-10-CM | POA: Diagnosis not present

## 2017-11-06 NOTE — Patient Instructions (Signed)
Abdominal Bracing With Pelvic Floor (Hook-Lying)   With neutral spine, tighten pelvic floor and abdominals. Hold 10 seconds. Repeat __10_ times. Do _several__ times a day. Can be performed in sitting, standing. .    Sleeping on Back  Place pillow under knees. A pillow with cervical support and a roll around waist are also helpful. Copyright  VHI. All rights reserved.  Sleeping on Side Place pillow between knees. Use cervical support under neck and a roll around waist as needed. Copyright  VHI. All rights reserved.   Sleeping on Stomach   If this is the only desirable sleeping position, place pillow under lower legs, and under stomach or chest as needed.  Posture - Sitting   Sit upright, head facing forward. Try using a roll to support lower back. Keep shoulders relaxed, and avoid rounded back. Keep hips level with knees. Avoid crossing legs for long periods. Stand to Sit / Sit to Stand   To sit: Bend knees to lower self onto front edge of chair, then scoot back on seat. To stand: Reverse sequence by placing one foot forward, and scoot to front of seat. Use rocking motion to stand up.   Work Height and Reach  Ideal work height is no more than 2 to 4 inches below elbow level when standing, and at elbow level when sitting. Reaching should be limited to arm's length, with elbows slightly bent.  Bending  Bend at hips and knees, not back. Keep feet shoulder-width apart.    Posture - Standing   Good posture is important. Avoid slouching and forward head thrust. Maintain curve in low back and align ears over shoul- ders, hips over ankles.  Alternating Positions   Alternate tasks and change positions frequently to reduce fatigue and muscle tension. Take rest breaks. Computer Work   Position work to Programmer, multimedia. Use proper work and seat height. Keep shoulders back and down, wrists straight, and elbows at right angles. Use chair that provides full back support. Add footrest  and lumbar roll as needed.  Getting Into / Out of Car  Lower self onto seat, scoot back, then bring in one leg at a time. Reverse sequence to get out.  Dressing  Lie on back to pull socks or slacks over feet, or sit and bend leg while keeping back straight.    Housework - Sink  Place one foot on ledge of cabinet under sink when standing at sink for prolonged periods.   Pushing / Pulling  Pushing is preferable to pulling. Keep back in proper alignment, and use leg muscles to do the work.  Deep Squat   Squat and lift with both arms held against upper trunk. Tighten stomach muscles without holding breath. Use smooth movements to avoid jerking.  Avoid Twisting   Avoid twisting or bending back. Pivot around using foot movements, and bend at knees if needed when reaching for articles.  Carrying Luggage   Distribute weight evenly on both sides. Use a cart whenever possible. Do not twist trunk. Move body as a unit.   Lifting Principles .Maintain proper posture and head alignment. .Slide object as close as possible before lifting. .Move obstacles out of the way. .Test before lifting; ask for help if too heavy. .Tighten stomach muscles without holding breath. .Use smooth movements; do not jerk. .Use legs to do the work, and pivot with feet. .Distribute the work load symmetrically and close to the center of trunk. .Push instead of pull whenever possible.   Ask For Help  Ask for help and delegate to others when possible. Coordinate your movements when lifting together, and maintain the low back curve.  Log Roll   Lying on back, bend left knee and place left arm across chest. Roll all in one movement to the right. Reverse to roll to the left. Always move as one unit. Housework - Sweeping  Use long-handled equipment to avoid stooping.   Housework - Wiping  Position yourself as close as possible to reach work surface. Avoid straining your back.  Laundry - Unloading  Wash   To unload small items at bottom of washer, lift leg opposite to arm being used to reach.  [place] close to area to be raked. Use arm movements to do the work. Keep back straight and avoid twisting.     Cart  When reaching into cart with one arm, lift opposite leg to keep back straight.   Getting Into / Out of Bed  Lower self to lie down on one side by raising legs and lowering head at the same time. Use arms to assist moving without twisting. Bend both knees to roll onto back if desired. To sit up, start from lying on side, and use same move-ments in reverse. Housework - Vacuuming  Hold the vacuum with arm held at side. Step back and forth to move it, keeping head up. Avoid twisting.   Laundry - IT consultant so that bending and twisting can be avoided.   Laundry - Unloading Dryer  Squat down to reach into clothes dryer or use a reacher.  Gardening - Weeding / Probation officer or Kneel. Knee pads may be helpful.

## 2017-11-06 NOTE — Therapy (Addendum)
[facility] [place], [place], [address] Phone: [phone]   Fax:  [phone]  Physical Therapy Treatment  Patient Details  Name: Robin Christensen MRN: [id number] Date of Birth: 1951-05-24 Referring Provider: Dr. Melrose Nakayama   Encounter Date: 11/06/2017  PT End of Session - 11/06/17 0904    Visit Number  2    Number of Visits  8    Date for PT Re-Evaluation  11/24/17    PT Start Time  0845    PT Stop Time  0948    PT Time Calculation (min)  63 min    Activity Tolerance  Patient limited by pain       Past Medical History:  Diagnosis Date  . Cancer ([facility])   . Dysrhythmia    AF  . Hyperlipidemia   . Hypertension   . Obesity   . PONV (postoperative nausea and vomiting)   . Sleep apnea    cpcap    Past Surgical History:  Procedure Laterality Date  . BREAST SURGERY    . CARDIOVERSION N/A 06/26/2014   Procedure: CARDIOVERSION;  Surgeon: Laverda Page, MD;  Location: [facility];  Service: Cardiovascular;  Laterality: N/A;  . CHOLECYSTECTOMY    . TOTAL KNEE ARTHROPLASTY Right 05/03/2016   Procedure: TOTAL KNEE ARTHROPLASTY;  Surgeon: Melrose Nakayama, MD;  Location: [facility];  Service: Orthopedics;  Laterality: Right;    There were no vitals filed for this visit.  Subjective Assessment - 11/06/17 0847    Subjective  Robin Christensen says she has been doing her home exercises daily, occasionally twice daily. She doesn't find any relief from self TPR using a ball on a wall. She remarked she "always has to find something to lean on." She gets the most relief from her heating pad.  She has finished taking her prednisone.    Pertinent History   Breast CA 1992, Rt TKA 04/2016, going to have Lt TKA probably in Aug.     Currently in Pain?  Yes    Pain Score  4     Pain Location  Buttocks radiates down ant leg, sometimes down to ankle    Pain Orientation  Right    Pain Descriptors / Indicators  Constant;Burning  sharp when bending    Aggravating Factors   sitting, sit to/from stand, bending    Pain Relieving Factors  walking (sometimes), heat         [facility] PT Assessment - 11/06/17 0001      Assessment   Medical Diagnosis  Rt lumbar radiculpathy    Referring Provider  Dr. Melrose Nakayama    Onset Date/Surgical Date  10/06/17    Hand Dominance  Right    Next MD Visit  11/23/17      Strength   Strength Assessment Site  Hip    Right/Left Hip  Right;Left    Right Hip Flexion  4+/5    Right Hip Extension  4/5    Right Hip ABduction  4+/5       [facility] Adult PT Treatment/Exercise - 11/06/17 0001      Self-Care   Self-Care  Other Self-Care Comments    Other Self-Care Comments   Educated on supine to/from sit via logrolling, core engagement with transitional movements (tactile and verbal cues for improved core engagement)      Lumbar Exercises: Stretches   Passive Hamstring Stretch  Right;Left;2 reps;30 seconds VCs to keep knee extended    Single Knee to  Chest Stretch  Left;Right;20 seconds    Double Knee to Chest Stretch  20 seconds    Lower Trunk Rotation  -- 10 reps x 2-3 sec; R rotation limited due to pain    Piriformis Stretch  Right;Left;2 reps;30 seconds    Figure 4 Stretch  30 seconds;With overpressure bilat      Lumbar Exercises: Aerobic   Tread Mill  5 min @ 1.5-2.0 MPH      Lumbar Exercises: Seated   Other Seated Lumbar Exercises  Core engagement with hands pressing into thighs x5 sec x5 reps, improved awareness of TA contraction      Lumbar Exercises: Supine   Bridge  Limitations    Bridge Limitations  trial, skipped due to increased LBP      Lumbar Exercises: Sidelying   Clam  10 reps;Right 2 sets, VCs to perform slowly    Other Sidelying Lumbar Exercises  Reverse Clam 10 reps;Right      Lumbar Exercises: Prone   Straight Leg Raise  -- 3 reps, stopped due to increased LBP      Moist Heat Therapy   Number Minutes Moist Heat  15 Minutes    Moist Heat Location  Lumbar  Spine      Electrical Stimulation   Electrical Stimulation Location  lumbar and Rt buttocks    Electrical Stimulation Action  IFC    Electrical Stimulation Parameters  to tolerance    Electrical Stimulation Goals  Pain;Tone      Manual Therapy   Manual Therapy  Taping    Manual therapy comments  2- I strips of regular Rock tape applied to Rt SI joint to assist in decompressing tissue, decrease pain and sensitivity.              PT Education - 11/06/17 0928    Education provided  Yes    Education Details  HEP, body mechanics    Person(s) Educated  Patient    Methods  Explanation;Handout    Comprehension  Verbalized understanding          PT Long Term Goals - 11/06/17 0956      PT LONG TERM GOAL #1   Title  I with advanced HEP to include a walking program (11/24/17)     Time  4    Period  Weeks    Status  On-going      PT LONG TERM GOAL #2   Title  improve FOTO =/< 34% limited ( 11/24/17)     Time  4    Period  Weeks    Status  On-going      PT LONG TERM GOAL #3   Title  report =/> 75% reduction of Rt buttock pain with sitting to prepare her for her flight to [place] ( 11/24/17)     Time  4    Period  Weeks    Status  On-going      PT LONG TERM GOAL #4   Title  demo Rt hip strength =/ 5-/5 ( 11/24/17)     Time  4    Period  Weeks    Status  On-going      PT LONG TERM GOAL #5   Title  tolerate core strengthening with good form ( 11/24/17)     Time  4    Period  Weeks    Status  On-going            Plan - 11/06/17 0958    Clinical  Impression Statement  Robin Christensen continues to present with core weakness and some resulting poor body mechanics. Pt reported pain with prone hip extension, bridging, lower trunk rotation to the R, supine SLR, and [place] hip stretch; modified HEP to avoid exercises that increase pain at this time. No changed in hip strength and no goals met at this time.    Rehab Potential  Good    PT Frequency  2x / week    PT Duration  4 weeks     PT Treatment/Interventions  Iontophoresis 38m/ml Dexamethasone;Dry needling;Manual techniques;Moist Heat;Therapeutic activities;Patient/family education;Taping;Therapeutic exercise;Cryotherapy;Electrical Stimulation;Ultrasound    PT Next Visit Plan  Assess and further educate on body mechanics. Manual work to B buttocks and low back, strengthening for core and hip. Assess pelvis alignment. Discuss DN.    Consulted and Agree with Plan of Care  Patient       Patient will benefit from skilled therapeutic intervention in order to improve the following deficits and impairments:  Pain, Increased muscle spasms, Decreased strength, Decreased range of motion  Visit Diagnosis: Chronic right-sided low back pain with right-sided sciatica  Muscle weakness (generalized)  Muscle spasm of back     Problem List Patient Active Problem List   Diagnosis Date Noted  . Primary localized osteoarthritis of right knee 05/03/2016  . Primary osteoarthritis of right knee 05/03/2016    AScarlett Presto SPTA 11/06/2017, 12:54 PM  This entire session was performed under direct supervision and direction of a licensed physical therapist assistant. I have personally read, edited and approve of the note as written.  JKerin Perna PTA 11/06/17 12:54 PM  [place] [address]Phone: [phone]  Fax:  [phone] Name: Robin DEESEMRN: [id number] of Birth: [birth year]

## 2017-11-08 ENCOUNTER — Encounter: Payer: Self-pay | Admitting: Physical Therapy

## 2017-11-08 ENCOUNTER — Ambulatory Visit: Payer: Medicare HMO | Admitting: Physical Therapy

## 2017-11-08 DIAGNOSIS — G8929 Other chronic pain: Secondary | ICD-10-CM

## 2017-11-08 DIAGNOSIS — M5441 Lumbago with sciatica, right side: Secondary | ICD-10-CM | POA: Diagnosis not present

## 2017-11-08 DIAGNOSIS — M62830 Muscle spasm of back: Secondary | ICD-10-CM

## 2017-11-08 DIAGNOSIS — M6281 Muscle weakness (generalized): Secondary | ICD-10-CM

## 2017-11-08 NOTE — Therapy (Signed)
[place], [place], [address] Phone: [phone]   Fax:  [phone]  Physical Therapy Treatment  Patient Details  Name: Robin Christensen MRN: [id number] Date of Birth: August 31, 1950 Referring Provider: Dr. Melrose Nakayama   Encounter Date: 11/08/2017  PT End of Session - 11/08/17 0851    Visit Number  3    Number of Visits  8    Date for PT Re-Evaluation  11/24/17    PT Start Time  0845    PT Stop Time  0946    PT Time Calculation (min)  61 min    Activity Tolerance  Patient tolerated treatment well       Past Medical History:  Diagnosis Date  . Cancer ([facility] [place])   . Dysrhythmia    AF  . Hyperlipidemia   . Hypertension   . Obesity   . PONV (postoperative nausea and vomiting)   . Sleep apnea    cpcap    Past Surgical History:  Procedure Laterality Date  . BREAST SURGERY    . CARDIOVERSION N/A 06/26/2014   Procedure: CARDIOVERSION;  Surgeon: Laverda Page, MD;  Location: [facility];  Service: Cardiovascular;  Laterality: N/A;  . CHOLECYSTECTOMY    . TOTAL KNEE ARTHROPLASTY Right 05/03/2016   Procedure: TOTAL KNEE ARTHROPLASTY;  Surgeon: Melrose Nakayama, MD;  Location: [facility];  Service: Orthopedics;  Laterality: Right;    There were no vitals filed for this visit.  Subjective Assessment - 11/08/17 0846    Subjective  Pat says her pain is on fire today. She forgot to try placing a rolled-up towel behind her back on her chair. She has been trying to bend more at the knees and less at the waist; she finds it helpful. She didn't notice much difference from the tape, and actually forgot it was there.    Pertinent History   Breast CA 1992, Rt TKA 04/2016, going to have Lt TKA probably in Aug.     Diagnostic tests  x-rays curve in low back and DDD in lumbar spine    Patient Stated Goals  Get exercises to strengthen the back. Play on her computer without pain. Tolerate flight to [place] beginning  of May    Pain Score  5     Pain Location  Buttocks    Pain Orientation  Right    Pain Descriptors / Indicators  Burning;Discomfort    Aggravating Factors   sitting, sit to/from stand, bending    Pain Relieving Factors  walking (sometimes), heat         [facility] PT Assessment - 11/08/17 0001      Assessment   Medical Diagnosis  Rt lumbar radiculpathy    Referring Provider  Dr. Melrose Nakayama    Onset Date/Surgical Date  10/06/17    Hand Dominance  Right    Next MD Visit  11/23/17      Palpation   SI assessment   Rt sacral torsion in prone, Rt ilium higher in standing; Rt ASIS lower than Lt.     Palpation comment  Point tender in Rt psoas        [facility] Adult PT Treatment/Exercise - 11/08/17 0001      Lumbar Exercises: Stretches   Passive Hamstring Stretch  Right;Left;2 reps;30 seconds    Single Knee to Chest Stretch  Left;Right;20 seconds    Double Knee to Chest Stretch  20 seconds    Figure 4 Stretch  30 seconds;With overpressure;2  reps bilat      Lumbar Exercises: Supine   Bent Knee Raise  10 reps VCs to keep core engaged    Bridge Limitations  trial, 1 rep - stopped due to increased LBP      Lumbar Exercises: Sidelying   Clam  10 reps;Right 2 sets; VCs to perform slowly, yellow TB on 2nd set    Other Sidelying Lumbar Exercises  Reverse Clam 10 reps;Right      Lumbar Exercises: Prone   Other Prone Lumbar Exercises  Prone pelvic press, 5s hold x10 reps with tactile cues for form; pelvic press with unilateral knee bend x5 reps each leg      Moist Heat Therapy   Number Minutes Moist Heat  15 Minutes    Moist Heat Location  Lumbar Spine      Electrical Stimulation   Electrical Stimulation Location  lumbar and Rt buttocks    Electrical Stimulation Action  IFC    Electrical Stimulation Parameters  to tolerance    Electrical Stimulation Goals  Pain;Tone      Manual Therapy   Manual Therapy  Muscle Energy Technique;Soft tissue mobilization    Soft tissue mobilization   gentle STM to Rt glute, hip rotators (very point tender)    Muscle Energy Technique  to correct R anterior pelvic rotation, 2 sets        PT Education - 11/08/17 0914    Education provided  Yes    Education Details  HEP    Person(s) Educated  Patient    Methods  Explanation;Demonstration;Handout;Verbal cues;Tactile cues    Comprehension  Verbalized understanding;Returned demonstration        PT Long Term Goals - 11/08/17 0944      PT LONG TERM GOAL #1   Title  I with advanced HEP to include a walking program (11/24/17)     Time  4    Period  Weeks    Status  On-going      PT LONG TERM GOAL #2   Title  improve FOTO =/< 34% limited ( 11/24/17)     Time  4    Period  Weeks    Status  On-going      PT LONG TERM GOAL #3   Title  report =/> 75% reduction of Rt buttock pain with sitting to prepare her for her flight to [place] ( 11/24/17)     Time  4    Period  Weeks    Status  On-going      PT LONG TERM GOAL #4   Title  demo Rt hip strength =/ 5-/5 ( 11/24/17)     Time  4    Period  Weeks    Status  On-going      PT LONG TERM GOAL #5   Title  tolerate core strengthening with good form ( 11/24/17)     Time  4    Period  Weeks    Status  On-going            Plan - 11/08/17 0915    Clinical Impression Statement  Robin Christensen is developing improved ability to engage her core and improved body mechanics. She is progressing toward her goals. She continues to be intolerant of bridging and have pain with transfers. Noted pelvis asymmetry; slightly improved with MET corrections.    Rehab Potential  Good    PT Frequency  2x / week    PT Duration  4 weeks    PT  Next Visit Plan  Reassess pelvic alignment; educate pt on prone pelvic series.     Consulted and Agree with Plan of Care  Patient       Patient will benefit from skilled therapeutic intervention in order to improve the following deficits and impairments:  Pain, Increased muscle spasms, Decreased strength, Decreased range of  motion  Visit Diagnosis: Chronic right-sided low back pain with right-sided sciatica  Muscle weakness (generalized)  Muscle spasm of back     Problem List Patient Active Problem List   Diagnosis Date Noted  . Primary localized osteoarthritis of right knee 05/03/2016  . Primary osteoarthritis of right knee 05/03/2016   Robin Christensen, Robin Christensen 11/08/17  This entire session was performed under direct supervision and direction of a licensed physical therapist assistant. I have personally read, edited and approve of the note as written.  Robin Christensen, PTA 11/08/17 12:50 PM  [place], [place], [address] Phone: [phone]   Fax:  [phone]  Name: BELLE CHARLIE MRN: [id number] Date of Birth: 1950-09-03

## 2017-11-08 NOTE — Patient Instructions (Signed)
  Knee to Chest: Transverse Plane Stability  ( CORE ENGAGEMENT)   Tighten abdominals.  Bring one knee up, then return. Be sure pelvis does not roll side to side. Keep pelvis still. Lift knee __10_ times each leg. Restabilize pelvis. Repeat with other leg. Do _1-2__ sets, _1-2__ times per day.  Abduction: Clam (Eccentric) - Side-Lying    Lie on side with knees bent. Lift top knee, keeping feet together. Keep trunk steady. Slowly lower for 3-5 seconds. _10__ reps per set, _2__ sets per day  [facility] [place], [place] [address]  [phone] (office) [phone] (fax)

## 2017-11-13 ENCOUNTER — Encounter: Payer: Self-pay | Admitting: Physical Therapy

## 2017-11-13 ENCOUNTER — Ambulatory Visit: Payer: Medicare HMO | Admitting: Physical Therapy

## 2017-11-13 DIAGNOSIS — M6281 Muscle weakness (generalized): Secondary | ICD-10-CM | POA: Diagnosis not present

## 2017-11-13 DIAGNOSIS — G8929 Other chronic pain: Secondary | ICD-10-CM

## 2017-11-13 DIAGNOSIS — M5441 Lumbago with sciatica, right side: Secondary | ICD-10-CM | POA: Diagnosis not present

## 2017-11-13 NOTE — Therapy (Signed)
[place], [place], [address] Phone: [phone]   Fax:  [phone]  Physical Therapy Treatment  Patient Details  Name: Robin Christensen MRN: [id number] Date of Birth: January 27, 1951 Referring Provider: Dr. Melrose Nakayama   Encounter Date: 11/13/2017  PT End of Session - 11/13/17 0844    Visit Number  4    Number of Visits  8    Date for PT Re-Evaluation  11/24/17    PT Start Time  0845    PT Stop Time  0945    PT Time Calculation (min)  60 min       Past Medical History:  Diagnosis Date  . Cancer ([facility])   . Dysrhythmia    AF  . Hyperlipidemia   . Hypertension   . Obesity   . PONV (postoperative nausea and vomiting)   . Sleep apnea    cpcap    Past Surgical History:  Procedure Laterality Date  . BREAST SURGERY    . CARDIOVERSION N/A 06/26/2014   Procedure: CARDIOVERSION;  Surgeon: Laverda Page, MD;  Location: [facility];  Service: Cardiovascular;  Laterality: N/A;  . CHOLECYSTECTOMY    . TOTAL KNEE ARTHROPLASTY Right 05/03/2016   Procedure: TOTAL KNEE ARTHROPLASTY;  Surgeon: Melrose Nakayama, MD;  Location: [facility];  Service: Orthopedics;  Laterality: Right;    There were no vitals filed for this visit.  Subjective Assessment - 11/13/17 0844    Subjective  Leaving for [place] in 1 week (staying one week). When pain gets aggravted, she gets up or gets on heating pad. Could tell a huge difference by the afternoon after previous pelvic adjustment. Previously-scheduled doctor's appointment is during upcoming trip, so she must reschedule.    Pertinent History   Breast CA 1992, Rt TKA 04/2016, going to have Lt TKA probably in Aug.     How long can you sit comfortably?  20'    How long can you walk comfortably?  no difficulty    Diagnostic tests  x-rays curve in low back and DDD in lumbar spine    Patient Stated Goals  Get exercises to strengthen the back. Play on her computer without pain.  Tolerate flight to [place] beginning of May    Pain Score  1     Pain Location  Buttocks    Pain Orientation  Right         [facility] PT Assessment - 11/13/17 0001      Assessment   Medical Diagnosis  Rt lumbar radiculpathy    Referring Provider  Dr. Melrose Nakayama    Onset Date/Surgical Date  10/06/17    Hand Dominance  Right    Next MD Visit  to be rescheduled      Palpation   SI assessment   R ASIS lower than L; R ilium slightly higher than L         [facility] Adult PT Treatment/Exercise - 11/13/17 0001      Lumbar Exercises: Stretches   Passive Hamstring Stretch  Right;Left;2 reps;30 seconds R hamstring held for 1 rep, 60s    Single Knee to Chest Stretch  Left;Right;20 seconds;2 reps    Figure 4 Stretch  2 reps;30 seconds;Supine;With overpressure B      Lumbar Exercises: Aerobic   Tread Mill  5.5 min @ 2.0-2.2 MPH      Lumbar Exercises: Supine   Bridge  5 reps    Bridge Limitations  2 trials of 1 rep,  stopped due to increased LBP; third trial after MET with no c/o      Lumbar Exercises: Sidelying   Clam  Left;10 reps 2 lb. weight added to knee for last 5 reps    Other Sidelying Lumbar Exercises  L reverse clam, 10 reps, 2 lb. weight on ankle      Lumbar Exercises: Prone   Other Prone Lumbar Exercises  Prone pelvic press, 5s hold x10 reps with tactile cues for form; pelvic press with unilateral knee bend x5 reps each leg      Moist Heat Therapy   Number Minutes Moist Heat  15 Minutes    Moist Heat Location  Lumbar Spine      Electrical Stimulation   Electrical Stimulation Location  lumbar and Rt buttocks    Electrical Stimulation Action  IFC    Electrical Stimulation Parameters  to tolerance    Electrical Stimulation Goals  Pain;Tone      Manual Therapy   Muscle Energy Technique  to correct R anterior pelvic rotation, 2 sets; MET correction for elevated R ilium          PT Long Term Goals - 11/13/17 1102      PT LONG TERM GOAL #1   Title  I with advanced HEP  to include a walking program (11/24/17)     Time  4    Period  Weeks    Status  On-going      PT LONG TERM GOAL #2   Title  improve FOTO =/< 34% limited ( 11/24/17)     Time  4    Period  Weeks    Status  On-going      PT LONG TERM GOAL #3   Title  report =/> 75% reduction of Rt buttock pain with sitting to prepare her for her flight to [place] ( 11/24/17)     Time  4    Period  Weeks    Status  On-going      PT LONG TERM GOAL #4   Title  demo Rt hip strength =/ 5-/5 ( 11/24/17)     Time  4    Period  Weeks    Status  On-going      PT LONG TERM GOAL #5   Title  tolerate core strengthening with good form ( 11/24/17)     Time  4    Period  Weeks    Status  On-going          Plan - 11/13/17 1103    Clinical Impression Statement  Pat's pelvis asymmetry was further improved with MET corrections, after which she was able to tolerate bridging for the first time. She is progressing toward her goals.    Rehab Potential  Good    PT Frequency  2x / week    PT Duration  4 weeks    PT Treatment/Interventions  Iontophoresis 34m/ml Dexamethasone;Dry needling;Manual techniques;Moist Heat;Therapeutic activities;Patient/family education;Taping;Therapeutic exercise;Cryotherapy;Electrical Stimulation;Ultrasound    PT Next Visit Plan  Reassess pelvic alignment & pt's performance of prone pelvic series.    Consulted and Agree with Plan of Care  Patient       Patient will benefit from skilled therapeutic intervention in order to improve the following deficits and impairments:  Pain, Increased muscle spasms, Decreased strength, Decreased range of motion  Visit Diagnosis: Chronic right-sided low back pain with right-sided sciatica  Muscle weakness (generalized)     Problem List Patient Active Problem List   Diagnosis Date  Noted  . Primary localized osteoarthritis of right knee 05/03/2016  . Primary osteoarthritis of right knee 05/03/2016    Scarlett Presto, SPTA 11/13/2017, 11:05 AM    Kerin Perna, PTA 11/13/17 12:46 PM  [facility] [place] [facility] [place], [place], [address] Phone: [phone]   Fax:  [phone]  Name: Robin Christensen MRN: [id number] Date of Birth: October 23, 1950

## 2017-11-15 ENCOUNTER — Ambulatory Visit: Payer: Medicare HMO | Admitting: Physical Therapy

## 2017-11-15 DIAGNOSIS — G8929 Other chronic pain: Secondary | ICD-10-CM

## 2017-11-15 DIAGNOSIS — M62830 Muscle spasm of back: Secondary | ICD-10-CM | POA: Diagnosis not present

## 2017-11-15 DIAGNOSIS — M6281 Muscle weakness (generalized): Secondary | ICD-10-CM | POA: Diagnosis not present

## 2017-11-15 DIAGNOSIS — M5441 Lumbago with sciatica, right side: Secondary | ICD-10-CM | POA: Diagnosis not present

## 2017-11-15 NOTE — Patient Instructions (Signed)
*   heating pad * ball for self massage * small blanket or towel to roll for lumbar support in car/plane  On Elbows (Prone)    Rise up on elbows as high as possible, keeping hips on floor. Hold __15__ seconds. Repeat __3__ times per set.   Trunk Extension    Standing, place back of open hands on low back. Straighten spine then arch the back and move shoulders back.  Hold 3 seconds.  Repeat _2-3___ times per session. Do _several___ sessions per week.   [facility] at [facility] [place], [place] [address]  [phone] (office) [phone] (fax)

## 2017-11-15 NOTE — Therapy (Signed)
[place], [place], [address] Phone: [phone]   Fax:  [phone]  Physical Therapy Treatment  Patient Details  Name: Robin Christensen MRN: [id number] Date of Birth: 10/04/1950 Referring Provider: Dr. Melrose Nakayama   Encounter Date: 11/15/2017  PT End of Session - 11/15/17 0855    Visit Number  5    Number of Visits  8    Date for PT Re-Evaluation  11/24/17    PT Start Time  0850    PT Stop Time  0945    PT Time Calculation (min)  55 min    Activity Tolerance  Patient tolerated treatment well    Behavior During Therapy  [facility] for tasks assessed/performed       Past Medical History:  Diagnosis Date  . Cancer ([facility])   . Dysrhythmia    AF  . Hyperlipidemia   . Hypertension   . Obesity   . PONV (postoperative nausea and vomiting)   . Sleep apnea    cpcap    Past Surgical History:  Procedure Laterality Date  . BREAST SURGERY    . CARDIOVERSION N/A 06/26/2014   Procedure: CARDIOVERSION;  Surgeon: Laverda Page, MD;  Location: [facility];  Service: Cardiovascular;  Laterality: N/A;  . CHOLECYSTECTOMY    . TOTAL KNEE ARTHROPLASTY Right 05/03/2016   Procedure: TOTAL KNEE ARTHROPLASTY;  Surgeon: Melrose Nakayama, MD;  Location: [facility];  Service: Orthopedics;  Laterality: Right;    There were no vitals filed for this visit.  Subjective Assessment - 11/15/17 0855    Subjective  Pt reports she felt "excellent" after last session and was pain free.  She mowed yesterday and this morning she woke up with pain in Rt low back down to ankle today.  She took extra strength tylenol prior to the session.  She leaves on Monday for [place]; plans to take her HEP with her.     Patient Stated Goals  Get exercises to strengthen the back. Play on her computer without pain. Tolerate flight to [place] beginning of May    Currently in Pain?  Yes    Pain Score  3     Pain Location  Buttocks    Pain Orientation   Right    Pain Descriptors / Indicators  Aching;Burning    Aggravating Factors   bending over.     Pain Relieving Factors  medicine, walking, heat          [facility] PT Assessment - 11/15/17 0001      Assessment   Medical Diagnosis  Rt lumbar radiculpathy    Referring Provider  Dr. Melrose Nakayama    Onset Date/Surgical Date  10/06/17    Hand Dominance  Right    Next MD Visit  11/29/17       [facility] Adult PT Treatment/Exercise - 11/15/17 0001      Lumbar Exercises: Stretches   Passive Hamstring Stretch  Right;Left;2 reps 45 sec, supine with strap     Standing Extension  5 reps;5 seconds    Prone on Elbows Stretch  4 reps;10 seconds    Piriformis Stretch  Right;2 reps;30 seconds    Figure 4 Stretch  2 reps;30 seconds;Supine;With overpressure B      Lumbar Exercises: Aerobic   Tread Mill  5.5 min @ 2.0-2.3 MPH      Lumbar Exercises: Supine   Bridge  5 reps    Bridge Limitations  1 rep painful; repeated after MET -  improved tolerance.       Lumbar Exercises: Prone   Other Prone Lumbar Exercises  prone pelvic press with tactile cues x 5 sec x 5 reps      Moist Heat Therapy   Number Minutes Moist Heat  20 Minutes    Moist Heat Location  Lumbar Spine;Hip      Electrical Stimulation   Electrical Stimulation Location  Rt lumbar and Rt buttocks    Electrical Stimulation Action  IFC    Electrical Stimulation Parameters  to tolerance    Electrical Stimulation Goals  Tone;Pain      Manual Therapy   Soft tissue mobilization  gentle STM to Rt lumbar paraspinals and QL, Rt glute    Muscle Energy Technique  to correct R anterior pelvic rotation, 2 sets                  PT Long Term Goals - 11/15/17 0902      PT LONG TERM GOAL #1   Title  I with advanced HEP to include a walking program (11/24/17)     Time  4    Period  Weeks    Status  Partially Met back to her regular walking program      PT LONG TERM GOAL #2   Title  improve FOTO =/< 34% limited ( 11/24/17)     Time   4    Period  Weeks    Status  On-going      PT LONG TERM GOAL #3   Title  report =/> 75% reduction of Rt buttock pain with sitting to prepare her for her flight to [place] ( 11/24/17)     Time  4    Period  Weeks    Status  On-going      PT LONG TERM GOAL #4   Title  demo Rt hip strength =/ 5-/5 ( 11/24/17)     Time  4    Period  Weeks    Status  On-going      PT LONG TERM GOAL #5   Title  tolerate core strengthening with good form ( 11/24/17)     Time  4    Period  Weeks    Status  On-going            Plan - 11/15/17 0940    Clinical Impression Statement  Pt had positive response to last treatment and reported she was painfree until mowing her lawn yesterday; flare up of radicular symptoms today.   Reduced symptoms and improved alignment with MET correction.  Further reduction of pain with use of estim and MHP.     Rehab Potential  Good    PT Frequency  2x / week    PT Duration  4 weeks    PT Treatment/Interventions  Iontophoresis 82m/ml Dexamethasone;Dry needling;Manual techniques;Moist Heat;Therapeutic activities;Patient/family education;Taping;Therapeutic exercise;Cryotherapy;Electrical Stimulation;Ultrasound    PT Next Visit Plan  MD note, assess goals, assess pelvic alignment.     Consulted and Agree with Plan of Care  Patient       Patient will benefit from skilled therapeutic intervention in order to improve the following deficits and impairments:  Pain, Increased muscle spasms, Decreased strength, Decreased range of motion  Visit Diagnosis: Chronic right-sided low back pain with right-sided sciatica  Muscle weakness (generalized)  Muscle spasm of back     Problem List Patient Active Problem List   Diagnosis Date Noted  . Primary localized osteoarthritis of right knee 05/03/2016  .  Primary osteoarthritis of right knee 05/03/2016    Kerin Perna, PTA 11/15/17 9:45 AM  [place]  [place], [place], [address] Phone: [phone]   Fax:  [phone]  Name: Robin Christensen MRN: [id number] Date of Birth: [birth year]

## 2017-11-28 ENCOUNTER — Ambulatory Visit: Payer: Medicare HMO | Admitting: Physical Therapy

## 2017-11-28 DIAGNOSIS — M6281 Muscle weakness (generalized): Secondary | ICD-10-CM | POA: Diagnosis not present

## 2017-11-28 DIAGNOSIS — G8929 Other chronic pain: Secondary | ICD-10-CM

## 2017-11-28 DIAGNOSIS — M5441 Lumbago with sciatica, right side: Secondary | ICD-10-CM

## 2017-11-28 DIAGNOSIS — M62830 Muscle spasm of back: Secondary | ICD-10-CM

## 2017-11-28 NOTE — Therapy (Signed)
[place], [place], [address] Phone: [phone]   Fax:  [phone]  Physical Therapy Treatment  Patient Details  Name: Robin Christensen MRN: [id number] Date of Birth: August 13, 1950 Referring Provider: Dr. Melrose Nakayama   Encounter Date: 11/28/2017  PT End of Session - 11/28/17 1257    Visit Number  6    Number of Visits  14    Date for PT Re-Evaluation  12/26/17    PT Start Time  1150    PT Stop Time  1245    PT Time Calculation (min)  55 min    Behavior During Therapy  [facility] for tasks assessed/performed       Past Medical History:  Diagnosis Date  . Cancer ([place])   . Dysrhythmia    AF  . Hyperlipidemia   . Hypertension   . Obesity   . PONV (postoperative nausea and vomiting)   . Sleep apnea    cpcap    Past Surgical History:  Procedure Laterality Date  . BREAST SURGERY    . CARDIOVERSION N/A 06/26/2014   Procedure: CARDIOVERSION;  Surgeon: Laverda Page, MD;  Location: [place];  Service: Cardiovascular;  Laterality: N/A;  . CHOLECYSTECTOMY    . TOTAL KNEE ARTHROPLASTY Right 05/03/2016   Procedure: TOTAL KNEE ARTHROPLASTY;  Surgeon: Melrose Nakayama, MD;  Location: [place];  Service: Orthopedics;  Laterality: Right;    There were no vitals filed for this visit.  Subjective Assessment - 11/28/17 1151    Subjective  Robin Christensen returns to therapy after 2 weeks away; was on a trip to [place].  Did a lot of walking and stair climbing.  She had some pain in Rt low back, "it wasn't perfect, but it wasn't enough to make me stop".     Currently in Pain?  Yes    Pain Score  4     Pain Location  Hip    Pain Orientation  Right;Lateral    Pain Descriptors / Indicators  Discomfort;Aching    Aggravating Factors   sitting     Pain Relieving Factors  walking, heat         [facility] PT Assessment - 11/28/17 0001      Assessment   Medical Diagnosis  Rt lumbar radiculpathy    Referring Provider   Dr. Melrose Nakayama    Onset Date/Surgical Date  10/06/17    Hand Dominance  Right    Next MD Visit  11/29/17      Strength   Strength Assessment Site  Hip    Right/Left Hip  Right    Right Hip Flexion  -- 5-/5    Right Hip ABduction  4+/5 with discomfort      Flexibility   Soft Tissue Assessment /Muscle Length  yes    Quadriceps  prone knee flex Rt 92, Lt 115      Palpation   SI assessment   Rt sacral torsion; iliac crest level; Rt LLE appear longer in supine; Rt ASIS lower than Lt.       [place] [facility] Adult PT Treatment/Exercise - 11/28/17 0001      Lumbar Exercises: Stretches   Standing Extension  1 rep;5 seconds    Prone on Elbows Stretch  1 rep;10 seconds    Quad Stretch  Right;Left;2 reps;30 seconds      Lumbar Exercises: Aerobic   Tread Mill  5.5 min @ 2.0 MPH      Lumbar Exercises: Supine  Clam  5 reps unilateral with core engaged, then bil with red band x 10    Bridge  5 reps    Bridge Limitations  1 rep painful; repeated after MET - improved tolerance.       Modalities   Modalities  Iontophoresis;Electrical Stimulation;Moist Heat      Moist Heat Therapy   Number Minutes Moist Heat  15 Minutes    Moist Heat Location  Lumbar Spine;Hip      Electrical Stimulation   Electrical Stimulation Location  Rt lumbar and Rt buttocks    Electrical Stimulation Action  IFC    Electrical Stimulation Parameters  to tolerance    Electrical Stimulation Goals  Tone;Pain      Manual Therapy   Soft tissue mobilization  sacral rocking to Rt to correct torsion; STM to Rt ant/lat hip to decrease fascial adhesions.   TPR to Rt piriformis.     Muscle Energy Technique  to correct R anterior pelvic rotation, 2 sets                  PT Long Term Goals - 11/28/17 1219      PT LONG TERM GOAL #1   Title  I with advanced HEP to include a walking program (12/26/17)     Time  4    Period  Weeks    Status  Partially Met back to walking program      PT LONG TERM GOAL #2   Title   improve FOTO =/< 34% limited ( 12/26/17)     Time  4    Period  Weeks    Status  On-going      PT LONG TERM GOAL #3   Title  report =/> 75% reduction of Rt buttock pain with sitting to prepare her for her flight to [place] ( 11/24/17)     Time  4    Period  Weeks    Status  Achieved      PT LONG TERM GOAL #4   Title  demo Rt hip strength =/ 5-/5 ( 12/26/17)     Time  4    Period  Weeks    Status  Partially Met      PT LONG TERM GOAL #5   Title  tolerate core strengthening with good form ( 12/26/17)     Time  4    Period  Weeks    Status  On-going improving            Plan - 11/28/17 1239    Clinical Impression Statement  Pt has had a flare up in Rt hip pain since traveling to [place].  Improved pelvic alignment after MET corrections.  Pt had some difficulty tolerating position changes and exercises due to increased pain in Rt lateral/posterior hip.  Pain improved with use of estim/MHP at end of session.  Pt has partially met her goals and will benefit from continued PT intervention to max functional mobility.     Rehab Potential  Good    PT Frequency  2x / week    PT Duration  4 weeks    PT Treatment/Interventions  Iontophoresis 62m/ml Dexamethasone;Dry needling;Manual techniques;Moist Heat;Therapeutic activities;Patient/family education;Taping;Therapeutic exercise;Cryotherapy;Electrical Stimulation;Ultrasound    PT Next Visit Plan  spoke to supervising PT; will request additional visits and continue pelvic/spinal stability exercises.      Consulted and Agree with Plan of Care  Patient       Patient will benefit from skilled therapeutic intervention  in order to improve the following deficits and impairments:  Pain, Increased muscle spasms, Decreased strength, Decreased range of motion  Visit Diagnosis: Chronic right-sided low back pain with right-sided sciatica  Muscle weakness (generalized)  Muscle spasm of back     Problem List Patient Active Problem List   Diagnosis  Date Noted  . Primary localized osteoarthritis of right knee 05/03/2016  . Primary osteoarthritis of right knee 05/03/2016   Kerin Perna, PTA 11/28/17 1:16 PM  Jeral Pinch, PT 11/28/17 1:16 PM  [facility] [place], [place], [address] Phone: [phone]   Fax:  [phone]  Name: NEKA BISE MRN: [id number] Date of Birth: 1950/09/28

## 2017-11-29 DIAGNOSIS — M545 Low back pain: Secondary | ICD-10-CM | POA: Diagnosis not present

## 2017-11-29 DIAGNOSIS — M7061 Trochanteric bursitis, right hip: Secondary | ICD-10-CM | POA: Diagnosis not present

## 2017-11-30 DIAGNOSIS — R600 Localized edema: Secondary | ICD-10-CM | POA: Diagnosis not present

## 2017-11-30 DIAGNOSIS — G4733 Obstructive sleep apnea (adult) (pediatric): Secondary | ICD-10-CM | POA: Diagnosis not present

## 2017-11-30 DIAGNOSIS — I480 Paroxysmal atrial fibrillation: Secondary | ICD-10-CM | POA: Diagnosis not present

## 2017-12-01 ENCOUNTER — Ambulatory Visit: Payer: Medicare HMO | Admitting: Physical Therapy

## 2017-12-01 DIAGNOSIS — M62830 Muscle spasm of back: Secondary | ICD-10-CM

## 2017-12-01 DIAGNOSIS — G8929 Other chronic pain: Secondary | ICD-10-CM

## 2017-12-01 DIAGNOSIS — M5441 Lumbago with sciatica, right side: Secondary | ICD-10-CM

## 2017-12-01 DIAGNOSIS — M6281 Muscle weakness (generalized): Secondary | ICD-10-CM

## 2017-12-01 NOTE — Therapy (Signed)
[place], [place], [address] Phone: [phone]   Fax:  [phone]  Physical Therapy Treatment  Patient Details  Name: Robin Christensen MRN: [id number] Date of Birth: 10-16-1950 Referring Provider: Dr. Melrose Nakayama   Encounter Date: 12/01/2017  PT End of Session - 12/01/17 0857    Visit Number  7    Number of Visits  14    Date for PT Re-Evaluation  12/26/17    PT Start Time  0856 pt arrived late    PT Stop Time  0952    PT Time Calculation (min)  56 min    Activity Tolerance  Patient tolerated treatment well    Behavior During Therapy  [facility] for tasks assessed/performed       Past Medical History:  Diagnosis Date  . Cancer ([place])   . Dysrhythmia    AF  . Hyperlipidemia   . Hypertension   . Obesity   . PONV (postoperative nausea and vomiting)   . Sleep apnea    cpcap    Past Surgical History:  Procedure Laterality Date  . BREAST SURGERY    . CARDIOVERSION N/A 06/26/2014   Procedure: CARDIOVERSION;  Surgeon: Laverda Page, MD;  Location: [place];  Service: Cardiovascular;  Laterality: N/A;  . CHOLECYSTECTOMY    . TOTAL KNEE ARTHROPLASTY Right 05/03/2016   Procedure: TOTAL KNEE ARTHROPLASTY;  Surgeon: Melrose Nakayama, MD;  Location: [place];  Service: Orthopedics;  Laterality: Right;    There were no vitals filed for this visit.  Subjective Assessment - 12/01/17 0858    Subjective  Pt reports she visited Dr since last visit.  She received an injection in her Rt hip 2 days ago and a script for additional PT sessions.  Otherwise no other changes.      Patient Stated Goals  Get exercises to strengthen the back. Play on her computer without pain.    Currently in Pain?  Yes    Pain Score  3     Pain Location  Hip    Pain Orientation  Right;Lateral    Pain Descriptors / Indicators  Discomfort    Aggravating Factors   sitting, transitional movements    Pain Relieving Factors   walking, heat          [facility] PT Assessment - 12/01/17 0001      Assessment   Medical Diagnosis  Rt lumbar radiculpathy    Referring Provider  Dr. Melrose Nakayama    Onset Date/Surgical Date  10/06/17    Hand Dominance  Right    Next MD Visit  12/30/17        [facility] Adult PT Treatment/Exercise - 12/01/17 0001      Lumbar Exercises: Stretches   Passive Hamstring Stretch  Right;Left;2 reps 45 sec, supine with strap     Standing Extension  1 rep;5 seconds    Prone on Elbows Stretch  --    Quad Stretch  Right;Left;2 reps;30 seconds    ITB Stretch  Right;Left;2 reps;30 seconds    Piriformis Stretch  Right;2 reps;30 seconds      Lumbar Exercises: Aerobic   Tread Mill  5.5 min @ up to 2.6 mph.        Moist Heat Therapy   Number Minutes Moist Heat  20 Minutes    Moist Heat Location  Lumbar Spine;Hip      Electrical Stimulation   Electrical Stimulation Location  Rt lumbar and Rt buttocks  Electrical Stimulation Action  IFC    Electrical Stimulation Parameters  to tolerance    Electrical Stimulation Goals  Tone;Pain      Manual Therapy   Soft tissue mobilization  TPR to Rt hip rotators with contract / relax of IR/ER of RLE; STM to Rt glutes and lumbar musculature.                   PT Long Term Goals - 11/28/17 1219      PT LONG TERM GOAL #1   Title  I with advanced HEP to include a walking program (12/26/17)     Time  4    Period  Weeks    Status  Partially Met back to walking program      PT LONG TERM GOAL #2   Title  improve FOTO =/< 34% limited ( 12/26/17)     Time  4    Period  Weeks    Status  On-going      PT LONG TERM GOAL #3   Title  report =/> 75% reduction of Rt buttock pain with sitting to prepare her for her flight to [place] ( 11/24/17)     Time  4    Period  Weeks    Status  Achieved      PT LONG TERM GOAL #4   Title  demo Rt hip strength =/ 5-/5 ( 12/26/17)     Time  4    Period  Weeks    Status  Partially Met      PT LONG TERM GOAL #5    Title  tolerate core strengthening with good form ( 12/26/17)     Time  4    Period  Weeks    Status  On-going improving            Plan - 12/01/17 1255    Clinical Impression Statement  Pt's Rt hip continues to be flared up from recent international travel.  She was point tender in Rt deep hip rotators; improved with TPR to area.  Pt reported reduction of pain to 1/10 at end of session.      PT Frequency  2x / week    PT Duration  4 weeks    PT Next Visit Plan  continue spinal stability exercises, manual therapy to Rt hip.     Consulted and Agree with Plan of Care  Patient       Patient will benefit from skilled therapeutic intervention in order to improve the following deficits and impairments:  Pain, Increased muscle spasms, Decreased strength, Decreased range of motion  Visit Diagnosis: Chronic right-sided low back pain with right-sided sciatica  Muscle weakness (generalized)  Muscle spasm of back     Problem List Patient Active Problem List   Diagnosis Date Noted  . Primary localized osteoarthritis of right knee 05/03/2016  . Primary osteoarthritis of right knee 05/03/2016   Kerin Perna, PTA 12/01/17 1:00 PM  [place], [place], [address] Phone: [phone]   Fax:  [phone]  Name: ZOUA CAPORASO MRN: [id number] Date of Birth: [birth year]

## 2017-12-04 DIAGNOSIS — G4733 Obstructive sleep apnea (adult) (pediatric): Secondary | ICD-10-CM | POA: Diagnosis not present

## 2017-12-05 ENCOUNTER — Ambulatory Visit: Payer: Medicare HMO | Admitting: Physical Therapy

## 2017-12-05 DIAGNOSIS — M25661 Stiffness of right knee, not elsewhere classified: Secondary | ICD-10-CM

## 2017-12-05 DIAGNOSIS — M5441 Lumbago with sciatica, right side: Secondary | ICD-10-CM | POA: Diagnosis not present

## 2017-12-05 DIAGNOSIS — G8929 Other chronic pain: Secondary | ICD-10-CM

## 2017-12-05 DIAGNOSIS — M62830 Muscle spasm of back: Secondary | ICD-10-CM | POA: Diagnosis not present

## 2017-12-05 DIAGNOSIS — M79661 Pain in right lower leg: Secondary | ICD-10-CM

## 2017-12-05 DIAGNOSIS — M6281 Muscle weakness (generalized): Secondary | ICD-10-CM | POA: Diagnosis not present

## 2017-12-05 DIAGNOSIS — M7989 Other specified soft tissue disorders: Secondary | ICD-10-CM

## 2017-12-05 DIAGNOSIS — R2689 Other abnormalities of gait and mobility: Secondary | ICD-10-CM | POA: Diagnosis not present

## 2017-12-05 NOTE — Therapy (Signed)
Robin Christ[place], [place], [address] Phone: [phone]   Fax:  [phone]  Physical Therapy Treatment  Patient Details  Name: Robin Christensen MRN: [id number] Date of Birth: [birth year] Referring Provider: Dr. Melrose Nakayama   Encounter Date: 12/05/2017  PT End of Session - 12/05/17 0943    Visit Number  8    Number of Visits  14    Date for PT Re-Evaluation  12/26/17    PT Start Time  0900    PT Stop Time  0950    PT Time Calculation (min)  50 min    Activity Tolerance  Patient tolerated treatment well    Behavior During Therapy  [facility] for tasks assessed/performed       Past Medical History:  Diagnosis Date  . Cancer ([place])   . Dysrhythmia    AF  . Hyperlipidemia   . Hypertension   . Obesity   . PONV (postoperative nausea and vomiting)   . Sleep apnea    cpcap    Past Surgical History:  Procedure Laterality Date  . BREAST SURGERY    . CARDIOVERSION N/A 06/26/2014   Procedure: CARDIOVERSION;  Surgeon: Laverda Page, MD;  Location: [place];  Service: Cardiovascular;  Laterality: N/A;  . CHOLECYSTECTOMY    . TOTAL KNEE ARTHROPLASTY Right 05/03/2016   Procedure: TOTAL KNEE ARTHROPLASTY;  Surgeon: Melrose Nakayama, MD;  Location: [place];  Service: Orthopedics;  Laterality: Right;    There were no vitals filed for this visit.  Subjective Assessment - 12/05/17 0906    Subjective  Pt reporting feeling a "knot" in her R buttock. Pt reporting pain was 7-8/10 on Sunday but 3-4/10 pain today.     Pertinent History   Breast CA 1992, Rt TKA 04/2016, going to have Lt TKA probably in Aug.     Patient Stated Goals  Get exercises to strengthen the back. Play on her computer without pain.    Currently in Pain?  Yes    Pain Score  4     Pain Location  Hip    Pain Orientation  Right;Lateral    Pain Descriptors / Indicators  Aching;Discomfort    Pain Type  Acute pain    Pain Onset  More than a  month ago    Aggravating Factors   sitting, transitional movement    Pain Relieving Factors  walking, heat         [facility] PT Assessment - 12/05/17 0001      Assessment   Medical Diagnosis  Rt lumbar radiculpathy    Referring Provider  Dr. Melrose Nakayama    Onset Date/Surgical Date  10/06/17    Hand Dominance  Right    Next MD Visit  12/30/17                   [facility] Adult PT Treatment/Exercise - 12/05/17 0001      Lumbar Exercises: Stretches   Passive Hamstring Stretch  Right;Left;2 reps seated holding 30-40 seconds    Standing Extension  1 rep;5 seconds    Quad Stretch  Right;Left;2 reps;30 seconds    ITB Stretch  Right;Left;2 reps;30 seconds    Piriformis Stretch  Right;2 reps;30 seconds      Lumbar Exercises: Aerobic   Tread Mill  6 min @ up to 2.3 mph to prevent foot dragging and work on heel to toe gait pattern        Lumbar Exercises: Supine  Clam  15 reps;Limitations    Clam Limitations  green theraband    Bridge  10 reps;5 seconds;Limitations    Bridge Limitations  tightening transverse abdominals first and performing PPT before lifting      Moist Heat Therapy   Number Minutes Moist Heat  15 Minutes    Moist Heat Location  Lumbar Spine      Electrical Stimulation   Electrical Stimulation Location  Rt lumbar and Rt buttocks    Electrical Stimulation Action  IFC    Electrical Stimulation Parameters  to tolerance    Electrical Stimulation Goals  Tone;Pain      Manual Therapy   Soft tissue mobilization  TPR to Rt hip rotators with contract / relax of IR/ER of RLE; STM to Rt glutes and lumbar musculature.              PT Education - 12/05/17 0916    Education provided  Yes    Education Details  seated HS stretches, hip abductors    Person(s) Educated  Patient    Methods  Explanation;Demonstration    Comprehension  Verbalized understanding          PT Long Term Goals - 11/28/17 1219      PT LONG TERM GOAL #1   Title  I with advanced  HEP to include a walking program (12/26/17)     Time  4    Period  Weeks    Status  Partially Met back to walking program      PT LONG TERM GOAL #2   Title  improve FOTO =/< 34% limited ( 12/26/17)     Time  4    Period  Weeks    Status  On-going      PT LONG TERM GOAL #3   Title  report =/> 75% reduction of Rt buttock pain with sitting to prepare her for her flight to [place] ( 11/24/17)     Time  4    Period  Weeks    Status  Achieved      PT LONG TERM GOAL #4   Title  demo Rt hip strength =/ 5-/5 ( 12/26/17)     Time  4    Period  Weeks    Status  Partially Met      PT LONG TERM GOAL #5   Title  tolerate core strengthening with good form ( 12/26/17)     Time  4    Period  Weeks    Status  On-going improving            Plan - 12/05/17 0925    Clinical Impression Statement  Pt continues to report pain in her L hip/SI joint. Pt reporting less pain today vs over the weekend. pt reporting relief following trigger point release, e-stim and moist heat. Continue with skilled PT and pt wiling to try TPDN at upcoming visit.     Rehab Potential  Good    PT Frequency  2x / week    PT Treatment/Interventions  Iontophoresis 23m/ml Dexamethasone;Dry needling;Manual techniques;Moist Heat;Therapeutic activities;Patient/family education;Taping;Therapeutic exercise;Cryotherapy;Electrical Stimulation;Ultrasound    PT Next Visit Plan  continue spinal stability exercises, manual therapy to Rt hip.     Consulted and Agree with Plan of Care  Patient       Patient will benefit from skilled therapeutic intervention in order to improve the following deficits and impairments:  Pain, Increased muscle spasms, Decreased strength, Decreased range of motion  Visit Diagnosis: Chronic  right-sided low back pain with right-sided sciatica  Muscle weakness (generalized)  Muscle spasm of back  Stiffness of right knee, not elsewhere classified  Pain and swelling of right lower leg  Other abnormalities  of gait and mobility     Problem List Patient Active Problem List   Diagnosis Date Noted  . Primary localized osteoarthritis of right knee 05/03/2016  . Primary osteoarthritis of right knee 05/03/2016    Robin Christensen, MPT 12/05/2017, 9:48 AM  [facility] [place], [place], [address] Phone: [phone]   Fax:  [phone]  Name: BURMA KETCHER MRN: [id number] Date of Birth: 1950/11/06

## 2017-12-07 ENCOUNTER — Ambulatory Visit: Payer: Medicare HMO | Admitting: Physical Therapy

## 2017-12-07 DIAGNOSIS — M25661 Stiffness of right knee, not elsewhere classified: Secondary | ICD-10-CM | POA: Diagnosis not present

## 2017-12-07 DIAGNOSIS — M6281 Muscle weakness (generalized): Secondary | ICD-10-CM | POA: Diagnosis not present

## 2017-12-07 DIAGNOSIS — G8929 Other chronic pain: Secondary | ICD-10-CM

## 2017-12-07 DIAGNOSIS — M62830 Muscle spasm of back: Secondary | ICD-10-CM | POA: Diagnosis not present

## 2017-12-07 DIAGNOSIS — M5441 Lumbago with sciatica, right side: Secondary | ICD-10-CM | POA: Diagnosis not present

## 2017-12-07 NOTE — Therapy (Addendum)
[place], [place], [address] Phone: [phone]   Fax:  [phone]  Physical Therapy Evaluation  Patient Details  Name: Robin Christensen MRN: [id number] Date of Birth: October 20, 1950 Referring Provider: Dr. Melrose Nakayama   Encounter Date: 12/07/2017  PT End of Session - 12/07/17 0910    Visit Number  9    Number of Visits  14    Date for PT Re-Evaluation  12/26/17    PT Start Time  0900    PT Stop Time  0955    PT Time Calculation (min)  55 min    Activity Tolerance  Patient tolerated treatment well    Behavior During Therapy  [facility] for tasks assessed/performed       Past Medical History:  Diagnosis Date  . Cancer ([place])   . Dysrhythmia    AF  . Hyperlipidemia   . Hypertension   . Obesity   . PONV (postoperative nausea and vomiting)   . Sleep apnea    cpcap    Past Surgical History:  Procedure Laterality Date  . BREAST SURGERY    . CARDIOVERSION N/A 06/26/2014   Procedure: CARDIOVERSION;  Surgeon: Laverda Page, MD;  Location: [place];  Service: Cardiovascular;  Laterality: N/A;  . CHOLECYSTECTOMY    . TOTAL KNEE ARTHROPLASTY Right 05/03/2016   Procedure: TOTAL KNEE ARTHROPLASTY;  Surgeon: Melrose Nakayama, MD;  Location: [place];  Service: Orthopedics;  Laterality: Right;    There were no vitals filed for this visit.   Subjective Assessment - 12/07/17 0902    Subjective  Pt arriving to therapy reporting "knot" in her R buttock, but reports it's much better today. Pt reporting pain of 1/10.     Pertinent History   Breast CA 1992, Rt TKA 04/2016, going to have Lt TKA probably in Aug.     How long can you sit comfortably?  20'    How long can you walk comfortably?  no difficulty    Diagnostic tests  x-rays curve in low back and DDD in lumbar spine    Patient Stated Goals  Get exercises to strengthen the back. Play on her computer without pain.    Currently in Pain?  Yes    Pain Score  1     Pain Location  Hip    Pain Orientation  Right    Pain Descriptors / Indicators  Aching;Discomfort    Pain Type  Acute pain    Pain Onset  More than a month ago    Aggravating Factors   sitting, transitional movements    Pain Relieving Factors  walking, heat         [facility] PT Assessment - 12/07/17 0001      Assessment   Medical Diagnosis  Rt lumbar radiculpathy    Referring Provider  Dr. Melrose Nakayama    Onset Date/Surgical Date  10/06/17    Hand Dominance  Right    Next MD Visit  12/30/17                Objective measurements completed on examination: See above findings.      [place] Adult PT Treatment/Exercise - 12/07/17 0001      Lumbar Exercises: Stretches   ITB Stretch  Right;Left;2 reps;30 seconds    Piriformis Stretch  Right;2 reps;30 seconds      Lumbar Exercises: Supine   Clam  15 reps;Limitations    Clam Limitations  green theraband  Bridge  10 reps;5 seconds;Limitations    Bridge Limitations  tightening transverse abdominals first and performing PPT before lifting    Other Supine Lumbar Exercises  PPT tilts with abdominal tightening for warm up      Lumbar Exercises: Sidelying   Hip Abduction  Right;10 reps;3 seconds;Limitations    Hip Abduction Limitations  10 reps with toes pointing down      Moist Heat Therapy   Number Minutes Moist Heat  20 Minutes    Moist Heat Location  Lumbar Spine;Hip      Electrical Stimulation   Electrical Stimulation Location  right lumbar spine, R piriformis    Electrical Stimulation Action  IFC    Electrical Stimulation Parameters  80-150 Hz x 20 minutes, intensity to tolerance    Electrical Stimulation Goals  Tone;Pain      Manual Therapy   Manual therapy comments  Instructed pt in self massage using a tennis ball     Soft tissue mobilization  TPR to Rt hip rotators with contract / relax of IR/ER of RLE; STM to Rt glutes and lumbar musculature.              PT Education - 12/07/17 1010     Education provided  Yes    Education Details  self massage with tennis ball    Person(s) Educated  Patient    Methods  Explanation;Demonstration    Comprehension  Verbalized understanding          PT Long Term Goals - 12/07/17 0944      PT LONG TERM GOAL #1   Title  I with advanced HEP to include a walking program (12/26/17)     Time  4    Period  Weeks    Status  Partially Met      PT LONG TERM GOAL #2   Title  improve FOTO =/< 34% limited ( 12/26/17)     Status  Unable to assess      PT LONG TERM GOAL #3   Title  report =/> 75% reduction of Rt buttock pain with sitting to prepare her for her flight to [place] ( 11/24/17)     Time  4    Period  Weeks    Status  Achieved      PT LONG TERM GOAL #4   Title  demo Rt hip strength =/ 5-/5 ( 12/26/17)     Time  4    Period  Weeks    Status  Partially Met      PT LONG TERM GOAL #5   Title  tolerate core strengthening with good form ( 12/26/17)     Time  4    Period  Weeks    Status  On-going               Patient will benefit from skilled therapeutic intervention in order to improve the following deficits and impairments:     Visit Diagnosis: Chronic right-sided low back pain with right-sided sciatica  Muscle weakness (generalized)  Muscle spasm of back  Stiffness of right knee, not elsewhere classified     Problem List Patient Active Problem List   Diagnosis Date Noted  . Primary localized osteoarthritis of right knee 05/03/2016  . Primary osteoarthritis of right knee 05/03/2016    [name], MPT 12/07/2017, 10:11 AM  [facility] [place], [place], [address] Phone: [phone]   Fax:  [phone]  Name:  Robin Christensen MRN: [id number] Date of Birth: 1951-01-25

## 2017-12-08 ENCOUNTER — Encounter: Payer: Self-pay | Admitting: Neurology

## 2017-12-08 ENCOUNTER — Ambulatory Visit: Payer: Medicare HMO | Admitting: Neurology

## 2017-12-08 VITALS — BP 128/78 | HR 52 | Ht 68.0 in | Wt 194.0 lb

## 2017-12-08 DIAGNOSIS — R51 Headache: Secondary | ICD-10-CM

## 2017-12-08 DIAGNOSIS — R519 Headache, unspecified: Secondary | ICD-10-CM

## 2017-12-08 DIAGNOSIS — G4733 Obstructive sleep apnea (adult) (pediatric): Secondary | ICD-10-CM | POA: Diagnosis not present

## 2017-12-08 DIAGNOSIS — G44219 Episodic tension-type headache, not intractable: Secondary | ICD-10-CM

## 2017-12-08 MED ORDER — NORTRIPTYLINE HCL 10 MG PO CAPS: 10.0000 mg | ORAL_CAPSULE | Freq: Every day | ORAL | 3 refills | Status: DC

## 2017-12-08 NOTE — Patient Instructions (Addendum)
1.  We will start nortriptyline 10mg  at bedtime to help reduce headaches.  If headaches not improved in 4 weeks, contact me and I will increase dose. 2.  Limit use of Tylenol (or any pain reliever) to no more than 2 days out of week to prevent rebound headache 3.  Follow sleep hygiene sheet 4.  Follow up with your dentist to evaluate for possible temporomandibular joint (TMJ) dysfunction, which may cause headaches. 5.  Drink plenty of water, continue exercising 6.  We will check MRI of brain with and without contrast. We have sent a referral to [facility] for your MRI and they will call you directly to schedule your appt. They are located at [place]. If you need to contact them directly please call [phone]. 7.  Keep headache diary 8.  Follow up in 3 months.

## 2017-12-08 NOTE — Progress Notes (Signed)
NEUROLOGY CONSULTATION NOTE  ILYANA MANUELE MRN: [id number] DOB: 1951/02/04  Referring provider: Dr. Orland Mustard Primary care provider: Dr. Orland Mustard  Reason for consult:  headache  HISTORY OF PRESENT ILLNESS: Robin Christensen is a 67 year old right-handed female with hypothyroidism, hyperlipidemia, hypertension, OSA, atrial fibrillation status post cardioversion and history of breast cancer status post mastectomy who presents for headache.  History supplemented by PCP note.  Sleep study report reviewed.  Onset:  A year ago.  At the time, they were severe and daily.  It was found that her OSA was uncontrolled and CPAP was adjusted.  Headaches resolved but returned about 2 months ago, albeit less severe.  CPAP was rechecked and is adequate. Location:  Bifrontal/across top of head Quality:  pressure Intensity:  mild.  She denies new headache, thunderclap headache or severe headache that wakes her from sleep. Aura:  no Prodrome:  no Postdrome:  no Associated symptoms:  Initially some nausea a year ago.  Now without nausea, vomiting, photophobia, phonophobia, autonomic symptoms or visual disturbance.  She denies associated unilateral numbness or weakness. Duration:  1 hour to all day Frequency:  3 days a week Frequency of abortive medication: Extra-strength Tylenol 3 days a week Triggers/exacerbating factors:  Any stressor, no matter how minor Relieving factors:  When she occupies herself with another activity Activity:  Does not aggravate She endorses aching pain in her jaw, right worse than left.  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  She hasn't seen a dentist in years.  She has seen ophthalmology and had cataract surgery this year.  Vision is improved.    Current NSAIDS:  Contraindicated (on anticoagulation) Current analgesics:  Extra-strength Tylenol Current triptans:  no Current anti-emetic:  no Current muscle relaxants:  no Current anti-anxiolytic:   no Current sleep aide:  no Current Antihypertensive medications:  furosemide 40mg  Current Antidepressant medications:  no Current Anticonvulsant medications:  no Current Vitamins/Herbal/Supplements:  Potassium Current Antihistamines/Decongestants:  no Other therapy:  no Other medication:  Eliquis, levothyroxine  Past NSAIDS:  no Past analgesics:  no Past abortive triptans:  no Past muscle relaxants:  no Past anti-emetic:  no Past antihypertensive medications:  Toprol XL Past antidepressant medications:  no Past anticonvulsant medications:  no Past vitamins/Herbal/Supplements:  no Past antihistamines/decongestants:  no Other past therapies:  no  Caffeine:  no Alcohol:  no Smoker:  no Diet:  Hydrates. Exercise:  Walks 3 miles 5 days a week Depression:  no; Anxiety:  no Other pain:  Mild neck pain Sleep hygiene:  Sleeps 4-5 hours straight, wakes up for 2 hours and then sleeps for another 2 hours.  She has OSA which is successfully treated with CPAP. She has history of headaches off and on throughout her life.  Family history of headache:  No   PAST MEDICAL HISTORY: Past Medical History:  Diagnosis Date  . Cancer ([place])   . Dysrhythmia    AF  . Hyperlipidemia   . Hypertension   . Obesity   . PONV (postoperative nausea and vomiting)   . Sleep apnea    cpcap    PAST SURGICAL HISTORY: Past Surgical History:  Procedure Laterality Date  . BREAST SURGERY    . CARDIOVERSION N/A 06/26/2014   Procedure: CARDIOVERSION;  Surgeon: Laverda Page, MD;  Location: [place];  Service: Cardiovascular;  Laterality: N/A;  . CHOLECYSTECTOMY    . TOTAL KNEE ARTHROPLASTY Right 05/03/2016   Procedure: TOTAL KNEE ARTHROPLASTY;  Surgeon:  Melrose Nakayama, MD;  Location: [place];  Service: Orthopedics;  Laterality: Right;    MEDICATIONS: Current Outpatient Medications on File Prior to Visit  Medication Sig Dispense Refill  . acetaminophen (TYLENOL) 500 MG tablet Take 500 mg by mouth  3 (three) times daily as needed for mild pain or headache.     Marland Kitchen ELIQUIS 5 MG TABS tablet Take 1 tablet (5 mg total) by mouth 2 (two) times daily. 60 tablet 0  . famotidine (PEPCID) 10 MG tablet Take 10 mg by mouth daily as needed for heartburn or indigestion.     . furosemide (LASIX) 40 MG tablet Take 40 mg by mouth daily.    Marland Kitchen HYDROcodone-acetaminophen (NORCO/VICODIN) 5-325 MG tablet Take 1-2 tablets by mouth every 4 (four) hours as needed (breakthrough pain). (Patient not taking: Reported on 10/27/2017) 40 tablet 0  . levothyroxine (SYNTHROID, LEVOTHROID) 75 MCG tablet Take 75 mcg by mouth daily before breakfast.    . methocarbamol (ROBAXIN) 500 MG tablet Take 1 tablet (500 mg total) by mouth every 6 (six) hours as needed for muscle spasms. 40 tablet 0  . metoprolol succinate (TOPROL-XL) 25 MG 24 hr tablet Take 25 mg by mouth at bedtime.    . pravastatin (PRAVACHOL) 40 MG tablet Take 40 mg by mouth daily.     . predniSONE (DELTASONE) 50 MG tablet Take 50 mg by mouth daily with breakfast.     No current facility-administered medications on file prior to visit.     ALLERGIES: Allergies  Allergen Reactions  . Xarelto [Rivaroxaban] Other (See Comments)    JOINT ACHES/PAIN    FAMILY HISTORY: Uncle:  [employer]  SOCIAL HISTORY: Social History   Socioeconomic History  . Marital status: Divorced    Spouse name: Not on file  . Number of children: 2  . Years of education: Not on file  . Highest education level: Bachelor's degree (e.g., BA, AB, BS)  Occupational History  . Occupation: RETIRED  Social Needs  . Financial resource strain: Not on file  . Food insecurity:    Worry: Not on file    Inability: Not on file  . Transportation needs:    Medical: Not on file    Non-medical: Not on file  Tobacco Use  . Smoking status: Never Smoker  . Smokeless tobacco: Never Used  Substance and Sexual Activity  . Alcohol use: Yes    Comment: once a year, maybe  . Drug use: No  . Sexual activity:  Not on file  Lifestyle  . Physical activity:    Days per week: Not on file    Minutes per session: Not on file  . Stress: Not on file  Relationships  . Social connections:    Talks on phone: Not on file    Gets together: Not on file    Attends religious service: Not on file    Active member of club or organization: Not on file    Attends meetings of clubs or organizations: Not on file    Relationship status: Not on file  . Intimate partner violence:    Fear of current or ex partner: Not on file    Emotionally abused: Not on file    Physically abused: Not on file    Forced sexual activity: Not on file  Other Topics Concern  . Not on file  Social History Narrative   Patient is right-handed. She avoids caffeine, walks 3 miles 5 days a week. She lives in a 2 story house.  Prior to retirement, she was in Press photographer.    REVIEW OF SYSTEMS: Constitutional: No fevers, chills, or sweats, no generalized fatigue, change in appetite Eyes: No visual changes, double vision, eye pain Ear, nose and throat: No hearing loss, ear pain, nasal congestion, sore throat Cardiovascular: No chest pain, palpitations Respiratory:  No shortness of breath at rest or with exertion, wheezes GastrointestinaI: No nausea, vomiting, diarrhea, abdominal pain, fecal incontinence Genitourinary:  No dysuria, urinary retention or frequency Musculoskeletal:  No neck pain, back pain Integumentary: No rash, pruritus, skin lesions Neurological: as above Psychiatric: No depression, insomnia, anxiety Endocrine: No palpitations, fatigue, diaphoresis, mood swings, change in appetite, change in weight, increased thirst Hematologic/Lymphatic:  No purpura, petechiae. Allergic/Immunologic: no itchy/runny eyes, nasal congestion, recent allergic reactions, rashes  PHYSICAL EXAM: Vitals:   12/08/17 0808  BP: 128/78  Pulse: (!) 52  SpO2: 98%   General: No acute distress.  Patient appears well-groomed.   Head:   Normocephalic/atraumatic Face:  Tenderness to palpation of TMJ bilaterally, crepitus Eyes:  fundi examined but not visualized Neck: supple, no paraspinal tenderness, full range of motion Back: No paraspinal tenderness Heart: regular rate and rhythm Lungs: Clear to auscultation bilaterally. Vascular: No carotid bruits. Neurological Exam: Mental status: alert and oriented to person, place, and time, recent and remote memory intact, fund of knowledge intact, attention and concentration intact, speech fluent and not dysarthric, language intact. Cranial nerves: CN I: not tested CN II: pupils equal, round and reactive to light, visual fields intact CN III, IV, VI:  full range of motion, no nystagmus, no ptosis CN V: facial sensation intact CN VII: upper and lower face symmetric CN VIII: hearing intact CN IX, X: gag intact, uvula midline CN XI: sternocleidomastoid and trapezius muscles intact CN XII: tongue midline Bulk & Tone: normal, no fasciculations. Motor:  5/5 throughout  Sensation: temperature and vibration sensation intact. Deep Tendon Reflexes:  2+ throughout, toes downgoing.  Finger to nose testing:  Without dysmetria.  Heel to shin:  Without dysmetria.  Gait:  Normal station and stride.  Able to turn and tandem walk. Romberg negative.  IMPRESSION: Probably episodic tension-type headache, not intractable.  However, given her history of cancer and age over 24 for new-onset headache, I would like to image the brain for secondary etiology.  Initially it was triggered by her OSA.  However, now it is controlled.  She has tenderness to palpation of her TMJs and teeth, raising possibility of TMJ dysfunction.  PLAN: 1.  We will start nortriptyline 10mg  at bedtime to help reduce headaches.  If headaches not improved in 4 weeks, contact me and I will increase dose. 2.  Limit use of Tylenol (or any pain reliever) to no more than 2 days out of week to prevent rebound headache 3.  Follow  sleep hygiene sheet 4.  Follow up with dentist to evaluate for possible temporomandibular joint (TMJ) dysfunction, which may cause headaches. 5.  Drink plenty of water, continue exercising 6.  We will check MRI of brain with and without contrast.  7.  Keep headache diary 8.  Follow up in 3 months.  Thank you for allowing me to take part in the care of this patient.  Metta Clines, DO  CC: London Pepper, MD

## 2017-12-13 ENCOUNTER — Ambulatory Visit: Payer: Medicare HMO | Admitting: Physical Therapy

## 2017-12-13 DIAGNOSIS — G8929 Other chronic pain: Secondary | ICD-10-CM | POA: Diagnosis not present

## 2017-12-13 DIAGNOSIS — M6281 Muscle weakness (generalized): Secondary | ICD-10-CM

## 2017-12-13 DIAGNOSIS — M62830 Muscle spasm of back: Secondary | ICD-10-CM

## 2017-12-13 DIAGNOSIS — M5441 Lumbago with sciatica, right side: Secondary | ICD-10-CM | POA: Diagnosis not present

## 2017-12-13 NOTE — Therapy (Signed)
[facility] [place], [place], [address] Phone: [phone]   Fax:  [phone]  Physical Therapy Treatment  Patient Details  Name: Robin Christensen MRN: [id number] Date of Birth: [birth year] Referring Provider: Dr. Melrose Nakayama   Encounter Date: 12/13/2017  PT End of Session - 12/13/17 1524    Visit Number  10    Number of Visits  14    Date for PT Re-Evaluation  12/26/17    PT Start Time  1520    PT Stop Time  1618    PT Time Calculation (min)  58 min    Activity Tolerance  Patient tolerated treatment well    Behavior During Therapy  [facility] for tasks assessed/performed       Past Medical History:  Diagnosis Date  . Cancer ([place])   . Dysrhythmia    AF  . Hyperlipidemia   . Hypertension   . Obesity   . PONV (postoperative nausea and vomiting)   . Sleep apnea    cpcap    Past Surgical History:  Procedure Laterality Date  . BREAST SURGERY    . CARDIOVERSION N/A 06/26/2014   Procedure: CARDIOVERSION;  Surgeon: Laverda Page, MD;  Location: [place];  Service: Cardiovascular;  Laterality: N/A;  . CHOLECYSTECTOMY    . TOTAL KNEE ARTHROPLASTY Right 05/03/2016   Procedure: TOTAL KNEE ARTHROPLASTY;  Surgeon: Melrose Nakayama, MD;  Location: [place];  Service: Orthopedics;  Laterality: Right;    There were no vitals filed for this visit.  Subjective Assessment - 12/13/17 1525    Subjective  Pt reports her flare up continues; painful when she rises from chair, and when she bends over.      Currently in Pain?  Yes    Pain Score  4     Pain Location  Sacrum    Pain Orientation  Right    Pain Radiating Towards  down to ant Rt thigh.          [facility] PT Assessment - 12/13/17 0001      Palpation   SI assessment   elevated Rt ilium; Rt anterior rotated ilium;  Rt sacral rotation     Palpation comment  Point tender in Rt psoas, Rt glute med, Rt QL.          [facility] Adult PT Treatment/Exercise -  12/13/17 0001      Self-Care   Other Self-Care Comments   pt educated on self MFR with ball to Rt psoas muscle in prone; pt returned demo with VC.       Lumbar Exercises: Stretches   Passive Hamstring Stretch  Left;Right;2 reps;20 seconds    Prone on Elbows Stretch  2 reps;20 seconds    Quad Stretch  Right;Left;2 reps;30 seconds    Piriformis Stretch  Right;2 reps;30 seconds      Lumbar Exercises: Aerobic   Tread Mill  5.5 min @ 2.0-2.2 MPH PTA present to discuss progress      Lumbar Exercises: Supine   Bridge  10 reps      Moist Heat Therapy   Number Minutes Moist Heat  20 Minutes    Moist Heat Location  Lumbar Spine;Hip      Electrical Stimulation   Electrical Stimulation Location  right lumbar spine, R piriformis    Electrical Stimulation Action  IFC    Electrical Stimulation Parameters  to tolerance    Electrical Stimulation Goals  Tone;Pain      Manual Therapy  Soft tissue mobilization  TPR to Rt hip rotators with contract / relax of IR/ER of RLE; STM to Rt glutes and lumbar musculature.     Muscle Energy Technique  to correct R anterior pelvic rotation, 3 sets, in supine with contract/relax of Rt hamstring; MET to correct elevated Rt ilium in Rt sidelying; MET to correct Rt rotated sacrum in prone.         PT Long Term Goals - 12/07/17 0944      PT LONG TERM GOAL #1   Title  I with advanced HEP to include a walking program (12/26/17)     Time  4    Period  Weeks    Status  Partially Met      PT LONG TERM GOAL #2   Title  improve FOTO =/< 34% limited ( 12/26/17)     Status  Unable to assess      PT LONG TERM GOAL #3   Title  report =/> 75% reduction of Rt buttock pain with sitting to prepare her for her flight to [place] ( 11/24/17)     Time  4    Period  Weeks    Status  Achieved      PT LONG TERM GOAL #4   Title  demo Rt hip strength =/ 5-/5 ( 12/26/17)     Time  4    Period  Weeks    Status  Partially Met      PT LONG TERM GOAL #5   Title  tolerate core  strengthening with good form ( 12/26/17)     Time  4    Period  Weeks    Status  On-going            Plan - 12/13/17 1601    Clinical Impression Statement  Pt presented with pelvic asymmetries today; improved alignment with MET corrections.  Pt reported decreased pain after MET / STM; further reduction with estim/ MH.  Pt initially unable to tolerate bridge at beginning of session; able to complete 5 full bridges with minimal pain after manual therapy.  Pt making gradual progress towards goals.     Rehab Potential  Good    PT Frequency  2x / week    PT Duration  4 weeks    PT Treatment/Interventions  Iontophoresis 45m/ml Dexamethasone;Dry needling;Manual techniques;Moist Heat;Therapeutic activities;Patient/family education;Taping;Therapeutic exercise;Cryotherapy;Electrical Stimulation;Ultrasound    PT Next Visit Plan  assess response to MET.  possibly DN.  pelvic stabilization exercises.     Consulted and Agree with Plan of Care  Patient       Patient will benefit from skilled therapeutic intervention in order to improve the following deficits and impairments:  Pain, Increased muscle spasms, Decreased strength, Decreased range of motion  Visit Diagnosis: No diagnosis found.     Problem List Patient Active Problem List   Diagnosis Date Noted  . Primary localized osteoarthritis of right knee 05/03/2016  . Primary osteoarthritis of right knee 05/03/2016   JKerin Perna PTA 12/13/17 4:23 PM  [address] [address]Phone: [phone]  Fax:  [phone] Name: Robin SARVISMRN: [id number] of Birth: [birth year]

## 2017-12-18 ENCOUNTER — Encounter: Payer: Medicare HMO | Admitting: Rehabilitative and Restorative Service Providers"

## 2017-12-19 ENCOUNTER — Ambulatory Visit: Payer: Medicare HMO | Admitting: Physical Therapy

## 2017-12-19 ENCOUNTER — Encounter: Payer: Self-pay | Admitting: Physical Therapy

## 2017-12-19 DIAGNOSIS — G8929 Other chronic pain: Secondary | ICD-10-CM

## 2017-12-19 DIAGNOSIS — M6281 Muscle weakness (generalized): Secondary | ICD-10-CM | POA: Diagnosis not present

## 2017-12-19 DIAGNOSIS — M62830 Muscle spasm of back: Secondary | ICD-10-CM

## 2017-12-19 DIAGNOSIS — M5441 Lumbago with sciatica, right side: Secondary | ICD-10-CM

## 2017-12-19 NOTE — Patient Instructions (Signed)

## 2017-12-19 NOTE — Therapy (Signed)
Robin Christensen, [place], [address] Phone: [phone]   Fax:  [phone]  Physical Therapy Treatment  Patient Details  Name: Robin Christensen MRN: [id number] Date of Birth: 1950/11/22 Referring Provider: Dr. Melrose Christensen   Encounter Date: 12/19/2017  PT End of Session - 12/19/17 1146    Visit Number  11    Number of Visits  14    Date for PT Re-Evaluation  12/26/17    PT Start Time  1147    PT Stop Time  0539    PT Time Calculation (min)  58 min    Activity Tolerance  Patient tolerated treatment well       Past Medical History:  Diagnosis Date  . Cancer ([place])   . Dysrhythmia    AF  . Hyperlipidemia   . Hypertension   . Obesity   . PONV (postoperative nausea and vomiting)   . Sleep apnea    cpcap    Past Surgical History:  Procedure Laterality Date  . BREAST SURGERY    . CARDIOVERSION N/A 06/26/2014   Procedure: CARDIOVERSION;  Surgeon: Robin Page, MD;  Location: [place];  Service: Cardiovascular;  Laterality: N/A;  . CHOLECYSTECTOMY    . TOTAL KNEE ARTHROPLASTY Right 05/03/2016   Procedure: TOTAL KNEE ARTHROPLASTY;  Surgeon: Robin Nakayama, MD;  Location: [place];  Service: Orthopedics;  Laterality: Right;    There were no vitals filed for this visit.  Subjective Assessment - 12/19/17 1149    Subjective  Pat reports she hasn't had severe pain, only nagging in the Rt hip/low back    Currently in Pain?  Yes    Pain Score  3     Pain Location  Hip    Pain Orientation  Right gluts    Pain Descriptors / Indicators  Nagging    Pain Type  Acute pain    Pain Onset  More than a month ago    Pain Frequency  Constant    Aggravating Factors   going up/down stairs, sitting feels like she is sititng on a rock.     Pain Relieving Factors  heat         [facility] PT Assessment - 12/19/17 0001      Observation/Other Assessments   Focus on Therapeutic Outcomes (FOTO)   44% limited  however pt verbally reports that she does feel like she is doing better                   [facility] Adult PT Treatment/Exercise - 12/19/17 0001      Lumbar Exercises: Stretches   Piriformis Stretch  Right;30 seconds    Figure 4 Stretch  60 seconds;With overpressure      Lumbar Exercises: Aerobic   Tread Mill  5.5 min @ 2.0-2.2 MPH      Lumbar Exercises: Sidelying   Clam  20 reps;Right reverse and regular    Other Sidelying Lumbar Exercises  10 reps CW/CCW circles, FWD/BWD toe taps.       Modalities   Modalities  Electrical Stimulation;Moist Heat      Moist Heat Therapy   Number Minutes Moist Heat  20 Minutes    Moist Heat Location  -- Rt buttocks       Electrical Stimulation   Electrical Stimulation Location  Rt buttocks    Electrical Stimulation Action  IFC    Electrical Stimulation Parameters  to tolerance    Electrical Stimulation Goals  Tone;Pain      Manual Therapy   Soft tissue mobilization  STM to Rt gluts, piriformis and hip rotators, increased tissue pliability after DN       Trigger Christensen Dry Needling - 12/19/17 1158    Consent Given?  Yes    Education Handout Provided  Yes    Muscles Treated Lower Body  Gluteus maximus;Gluteus minimus;Piriformis Rt side    Gluteus Maximus Response  Twitch response elicited;Palpable increased muscle length    Gluteus Minimus Response  Palpable increased muscle length    Piriformis Response  Palpable increased muscle length;Twitch response elicited with stim                PT Long Term Goals - 12/19/17 1231      PT LONG TERM GOAL #1   Title  I with advanced HEP to include a walking program (12/26/17)     Status  Partially Met      PT LONG TERM GOAL #2   Title  improve FOTO =/< 34% limited ( 12/26/17)     Status  On-going scored 44% lmited       PT LONG TERM GOAL #3   Status  Achieved      PT LONG TERM GOAL #4   Title  demo Rt hip strength =/ 5-/5 ( 12/26/17)     Status  Partially Met      PT LONG  TERM GOAL #5   Title  tolerate core strengthening with good form ( 12/26/17)     Status  On-going            Plan - 12/19/17 1229    Clinical Impression Statement  Robin Christensen had a good response to DN in the Rt gluts and piriformis, increased tissue elasticity and decreased pt reports of tightness in standing. Her FOTO score was lower than on initial assessment however patient reports she is doing better.  She may need a little more DN and definetly needs hip and core stabilization exercise.     Rehab Potential  Good    PT Frequency  2x / week    PT Duration  4 weeks    PT Treatment/Interventions  Iontophoresis 69m/ml Dexamethasone;Dry needling;Manual techniques;Moist Heat;Therapeutic activities;Patient/family education;Taping;Therapeutic exercise;Cryotherapy;Electrical Stimulation;Ultrasound    PT Next Visit Plan  assess response to DN    Consulted and Agree with Plan of Care  Patient       Patient will benefit from skilled therapeutic intervention in order to improve the following deficits and impairments:  Pain, Increased muscle spasms, Decreased strength, Decreased range of motion  Visit Diagnosis: Chronic right-sided low back pain with right-sided sciatica  Muscle weakness (generalized)  Muscle spasm of back     Problem List Patient Active Problem List   Diagnosis Date Noted  . Primary localized osteoarthritis of right knee 05/03/2016  . Primary osteoarthritis of right knee 05/03/2016    SJeral PinchPT 12/19/2017, 12:33 PM  [facility] [address] [address]Phone: [phone]  Fax:  [phone] Name: Robin ANNASMRN: [id number] of Birth: 2Sep 11, 1952

## 2017-12-21 ENCOUNTER — Ambulatory Visit: Payer: Medicare HMO | Admitting: Physical Therapy

## 2017-12-21 DIAGNOSIS — M62830 Muscle spasm of back: Secondary | ICD-10-CM

## 2017-12-21 DIAGNOSIS — M6281 Muscle weakness (generalized): Secondary | ICD-10-CM

## 2017-12-21 DIAGNOSIS — M5441 Lumbago with sciatica, right side: Secondary | ICD-10-CM

## 2017-12-21 DIAGNOSIS — G8929 Other chronic pain: Secondary | ICD-10-CM | POA: Diagnosis not present

## 2017-12-21 NOTE — Therapy (Signed)
[place], [place], [address] Phone: [phone]   Fax:  [phone]  Physical Therapy Treatment  Patient Details  Name: Robin Christensen MRN: [id number] Date of Birth: 12-Apr-1951 Referring Provider: Dr. Melrose Nakayama   Encounter Date: 12/21/2017  PT End of Session - 12/21/17 1022    Visit Number  12    Number of Visits  14    Date for PT Re-Evaluation  12/26/17    PT Start Time  1016    PT Stop Time  1116    PT Time Calculation (min)  60 min    Activity Tolerance  Patient limited by pain    Behavior During Therapy  [facility] for tasks assessed/performed       Past Medical History:  Diagnosis Date  . Cancer ([place])   . Dysrhythmia    AF  . Hyperlipidemia   . Hypertension   . Obesity   . PONV (postoperative nausea and vomiting)   . Sleep apnea    cpcap    Past Surgical History:  Procedure Laterality Date  . BREAST SURGERY    . CARDIOVERSION N/A 06/26/2014   Procedure: CARDIOVERSION;  Surgeon: Laverda Page, MD;  Location: [facility];  Service: Cardiovascular;  Laterality: N/A;  . CHOLECYSTECTOMY    . 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  Subjective Assessment - 12/21/17 1022    Subjective  Pt reports her hip feels a little worse than last visit.  She reports she rode to/from [place] yesterday and walked around [employer].  She voices frustration with continued pain in back/hip/LE.     Patient Stated Goals  Get exercises to strengthen the back. Play on her computer without pain.    Currently in Pain?  Yes    Pain Score  5     Pain Location  Hip    Pain Orientation  Right glute    Pain Descriptors / Indicators  Nagging    Aggravating Factors   transitional movements    Pain Relieving Factors   heat          [facility] PT Assessment - 12/21/17 0001      Assessment   Medical Diagnosis  Rt lumbar radiculpathy    Referring Provider  Dr. Melrose Nakayama    Onset Date/Surgical Date  10/06/17    Hand Dominance  Right    Next MD Visit  12/29/17       [facility] Adult PT Treatment/Exercise - 12/21/17 0001      Lumbar Exercises: Stretches   Passive Hamstring Stretch  Right;Left;3 reps;30 seconds straight knee, then slightly bent knee.     Piriformis Stretch  Right;Left;3 reps;30 seconds      Lumbar Exercises: Aerobic   Tread Mill  5.5 min @ 2.0-2.2 MPH PTA present to discuss progress      Lumbar Exercises: Supine   Bridge  5 reps core engaged, between MET corrections      Moist Heat Therapy   Number Minutes Moist Heat  20 Minutes    Moist Heat Location  -- Rt buttocks       Electrical Stimulation   Electrical Stimulation Location  Rt buttocks    Electrical Stimulation Action  TENS    Electrical Stimulation Parameters  to tolerance  Electrical Stimulation Goals  Pain      Manual Therapy   Soft tissue mobilization  TPR and deep tissue release to Rt hip rotators with contract / relax of IR/ER of RLE; STM to Rt glutes.  Pin and stretch to Rt hamstring in Lt sidelying with active knee ext;    Muscle Energy Technique  to correct R anterior pelvic rotation, 1 set, in supine with contract/relax of Rt hamstring; attempted MET to correct Rt rotated sacrum in prone, unable to tolerate due to knee pain.       discussed current HEP to check if any modifications need to be made.  Recommended pt begin trial of ice pack to affected area at home, and educated her on parameters.      PT Education - 12/21/17 1119    Education provided  Yes    Education Details  TENS    Person(s) Educated  Patient    Methods  Explanation;Handout    Comprehension  Verbalized understanding          PT Long Term Goals - 12/21/17 1028      PT LONG TERM GOAL #1   Title  I with advanced HEP to include a  walking program (12/26/17)     Time  4    Period  Weeks    Status  Partially Met      PT LONG TERM GOAL #2   Title  improve FOTO =/< 34% limited ( 12/26/17)     Time  4    Period  Weeks    Status  On-going      PT LONG TERM GOAL #3   Title  report =/> 75% reduction of Rt buttock pain with sitting to prepare her for her flight to [place] ( 11/24/17)     Time  4    Period  Weeks    Status  Achieved      PT LONG TERM GOAL #4   Title  demo Rt hip strength =/ 5-/5 ( 12/26/17)     Time  4    Period  Weeks    Status  Partially Met      PT LONG TERM GOAL #5   Title  tolerate core strengthening with good form ( 12/26/17)     Time  4    Period  Weeks    Status  On-going            Plan - 12/21/17 1108    Clinical Impression Statement  Pt reporting increased Rt hip symptoms; limited tolerance for exercises this visit.  Very point tender with palpation to prox Rt hamstring at ischial tuberosity, and obterator internus; slightly improved with Manual therapy.  Pt making slow progress since recent flare up.     Rehab Potential  Good    PT Frequency  2x / week    PT Duration  4 weeks    PT Treatment/Interventions  Iontophoresis 1m/ml Dexamethasone;Dry needling;Manual techniques;Moist Heat;Therapeutic activities;Patient/family education;Taping;Therapeutic exercise;Cryotherapy;Electrical Stimulation;Ultrasound    PT Next Visit Plan  end of POC 6/11.  continue core strengthening and releasing tightness in Rt posterior hip.     Consulted and Agree with Plan of Care  Patient       Patient will benefit from skilled therapeutic intervention in order to improve the following deficits and impairments:  Pain, Increased muscle spasms, Decreased strength, Decreased range of motion  Visit Diagnosis: Chronic right-sided low back pain with right-sided sciatica  Muscle weakness (generalized)  Muscle spasm  of back     Problem List Patient Active Problem List   Diagnosis Date Noted  . Primary  localized osteoarthritis of right knee 05/03/2016  . Primary osteoarthritis of right knee 05/03/2016   Kerin Perna, PTA 12/21/17 1:23 PM  [facility] [place], [place], [address] Phone: [phone]   Fax:  [phone]  Name: PORCHA DEBLANC MRN: [id number] Date of Birth: Aug 20, 1950

## 2017-12-21 NOTE — Patient Instructions (Signed)
TENS UNIT  This is helpful for muscle pain and spasm.   Search and Purchase a TENS 7000 2nd edition at [email or web address]  (It should be less than $30)     TENS unit instructions:   Do not shower or bathe with the unit on  Turn the unit off before removing electrodes or batteries  If the electrodes lose stickiness add a drop of water to the electrodes after they are disconnected from the unit and place on plastic sheet. If you continued to have difficulty, call the TENS unit company to purchase more electrodes.  Do not apply lotion on the skin area prior to use. Make sure the skin is clean and dry as this will help prolong the life of the electrodes.  After use, always check skin for unusual red areas, rash or other skin difficulties. If there are any skin problems, does not apply electrodes to the same area.  Never remove the electrodes from the unit by pulling the wires.  Do not use the TENS unit or electrodes other than as directed.  Do not change electrode placement without consulting your therapist or physician.  Keep 2 fingers with between each electrode.

## 2017-12-27 ENCOUNTER — Ambulatory Visit: Payer: Medicare HMO | Admitting: Physical Therapy

## 2017-12-27 ENCOUNTER — Encounter: Payer: Self-pay | Admitting: Physical Therapy

## 2017-12-27 DIAGNOSIS — M5441 Lumbago with sciatica, right side: Secondary | ICD-10-CM | POA: Diagnosis not present

## 2017-12-27 DIAGNOSIS — G8929 Other chronic pain: Secondary | ICD-10-CM | POA: Diagnosis not present

## 2017-12-27 DIAGNOSIS — M6281 Muscle weakness (generalized): Secondary | ICD-10-CM

## 2017-12-27 DIAGNOSIS — M62830 Muscle spasm of back: Secondary | ICD-10-CM

## 2017-12-27 NOTE — Therapy (Addendum)
[place], [place], [address] Phone: [phone]   Fax:  [phone]  Physical Therapy Treatment  Patient Details  Name: Robin Christensen MRN: [id number] Date of Birth: [birth year] Referring Provider: Dr. Melrose Nakayama   Encounter Date: 12/27/2017  PT End of Session - 12/27/17 0848    Visit Number  13    Number of Visits  14    Date for PT Re-Evaluation  12/26/17    PT Start Time  0849    PT Stop Time  0950    PT Time Calculation (min)  61 min    Activity Tolerance  Patient tolerated treatment well    Behavior During Therapy  [facility] for tasks assessed/performed       Past Medical History:  Diagnosis Date  . Cancer ([place])   . Dysrhythmia    AF  . Hyperlipidemia   . Hypertension   . Obesity   . PONV (postoperative nausea and vomiting)   . Sleep apnea    cpcap    Past Surgical History:  Procedure Laterality Date  . BREAST SURGERY    . CARDIOVERSION N/A 06/26/2014   Procedure: CARDIOVERSION;  Surgeon: Laverda Page, MD;  Location: [facility];  Service: Cardiovascular;  Laterality: N/A;  . CHOLECYSTECTOMY    . 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  Subjective Assessment - 12/27/17 0851    Subjective  Pt reports she had some relief in her hip during last session.  She felt even better the next day. She has not been walking like she normally does (60 min flat terraine) for 6 days.  She has noticed her pain has improved to where she can now get out of chair and bed without pain.      Patient Stated Goals  Get exercises to strengthen the back. Play on her computer without pain.    Currently in Pain?  No/denies    Pain Score  0-No pain         [facility] PT Assessment - 12/27/17 0001      Assessment   Medical  Diagnosis  Rt lumbar radiculpathy    Referring Provider  Dr. Melrose Nakayama    Onset Date/Surgical Date  10/06/17    Hand Dominance  Right    Next MD Visit  01/01/18      Strength   Right Hip Extension  -- 5-/5    Right Hip ABduction  -- 5-/5      Palpation   SI assessment   Rt slight anterior rotated ilium;  Rt sacral rotation        [facility] Adult PT Treatment/Exercise - 12/27/17 0001      Lumbar Exercises: Stretches   Passive Hamstring Stretch  Right;Left;3 reps;30 seconds straight knee, then slightly bent knee.     Prone on Elbows Stretch  3 reps;10 seconds    Quad Stretch  Right;Left;60 seconds prone with strap    Piriformis Stretch  Right;Left;3 reps;30 seconds      Lumbar Exercises: Aerobic   Tread Mill  6.5 min @ 2.0-2.2 MPH PTA present to discuss progress; some discomfort @ 80mn      Lumbar Exercises: Supine   Bridge  10 reps      Lumbar Exercises: Sidelying   Clam  20 reps;Right reverse  and regular      Lumbar Exercises: Prone   Opposite Arm/Leg Raise  Right arm/Left leg;Left arm/Right leg;5 reps;2 seconds;Limitations 2 sets on RLE/LUE      Moist Heat Therapy   Number Minutes Moist Heat  20 Minutes    Moist Heat Location  -- Rt buttocks       Electrical Stimulation   Electrical Stimulation Location  Rt buttocks    Electrical Stimulation Action  IFC    Electrical Stimulation Parameters  to tolerance    Electrical Stimulation Goals  Pain      Manual Therapy   Soft tissue mobilization  TPR to Rt hip rotators with contract / relax of IR/ER of RLE; STM to Rt glutes and lumbar musculature.     Muscle Energy Technique  to correct R anterior pelvic rotation, 1 set in supine with contract/relax of Rt hamstring;  MET to correct Rt rotated sacrum in prone.              PT Education - 12/27/17 1157    Education provided  Yes    Education Details  HEP- new one issued, reviewed and modified previous one.     Person(s) Educated  Patient    Methods   Explanation;Handout;Verbal cues;Demonstration    Comprehension  Verbalized understanding;Returned demonstration          PT Long Term Goals - 12/27/17 0900      PT LONG TERM GOAL #1   Title  I with advanced HEP to include a walking program (12/26/17)     Time  4    Period  Weeks    Status  Partially Met      PT LONG TERM GOAL #2   Title  improve FOTO =/< 34% limited ( 12/26/17)     Time  4    Period  Weeks    Status  On-going      PT LONG TERM GOAL #3   Title  report =/> 75% reduction of Rt buttock pain with sitting to prepare her for her flight to [place] ( 11/24/17)     Time  4    Period  Weeks    Status  Achieved      PT LONG TERM GOAL #4   Title  demo Rt hip strength =/ 5-/5 ( 12/26/17)     Time  4    Period  Weeks    Status  Achieved      PT LONG TERM GOAL #5   Title  tolerate core strengthening with good form ( 12/26/17)     Time  4    Period  Weeks    Status  Achieved            Plan - 12/27/17 1149    Clinical Impression Statement  Pt's symptoms had reduced significantly since last visit, however pt also reporting reduction in walking due to weather. Pt not as point tender in Rt posterior hip as in past. Pt tolerated all exercises well, with minimal increase in pain.Pt has partially met her goals.   Pt requests to hold therapy while she works on [employer].     Rehab Potential  Good    PT Frequency  2x / week    PT Duration  4 weeks    PT Treatment/Interventions  Iontophoresis 23m/ml Dexamethasone;Dry needling;Manual techniques;Moist Heat;Therapeutic activities;Patient/family education;Taping;Therapeutic exercise;Cryotherapy;Electrical Stimulation;Ultrasound    PT Next Visit Plan  spoke to supervising PT; Will hold therapy per pt request until 6/27.  If pt doesn't return, will d/c at that time.     Consulted and Agree with Plan of Care  Patient       Patient will benefit from skilled therapeutic intervention in order to improve the following deficits and  impairments:  Pain, Increased muscle spasms, Decreased strength, Decreased range of motion  Visit Diagnosis: Chronic right-sided low back pain with right-sided sciatica  Muscle weakness (generalized)  Muscle spasm of back     Problem List Patient Active Problem List   Diagnosis Date Noted  . Primary localized osteoarthritis of right knee 05/03/2016  . Primary osteoarthritis of right knee 05/03/2016   Kerin Perna, PTA 12/27/17 11:59 AM  [place], [place], [address] Phone: [phone]   Fax:  [phone]  Name: Robin Christensen MRN: [id number] Date of Birth: December 17, 1950  PHYSICAL THERAPY DISCHARGE SUMMARY  Visits from Start of Care: 13  Current functional level related to goals / functional outcomes: See progress note for discharge status   Remaining deficits: Unknown    Education / Equipment: HEP Plan: Patient agrees to discharge.  Patient goals were met. Patient is being discharged due to meeting the stated rehab goals.  ?????    Celyn P. Helene Kelp PT, MPH 02/09/18 2:42 PM

## 2017-12-27 NOTE — Patient Instructions (Signed)
3 part core exercise    With neutral spine, tighten pelvic floor, then tighten abdominal muscles sucking your belly button to back bone, then tighten muscles in low back at waist. Hold 10 seconds. Repeat _10_ times. Do _several__ times a day.  * When you have mastered this, you can contract all 3 muscle groups at same time.   * Progress to do this activity sitting, standing, walking and functional activities.   Therapeutic - Bridging    Lift buttocks, keeping back straight and arms on floor.  Tighten abdominals. Hold __2-5__ seconds. Repeat _10-15___ times.  HIP: Hamstrings - Supine  Place strap around foot. Raise leg up, keeping knee straight.  Bend opposite knee to protect back if indicated. Hold 30 seconds. 3 reps per set, 2-3 sets per day  Quads / HF, Prone KNEE: Quadriceps - Prone    Place strap around ankle. Bring ankle toward buttocks. Press hip into surface. Hold 30 seconds. Repeat 3 times per session. Do 2-3 sessions per day.  Arm / Leg Lift: Opposite (Prone)    Lift right leg and opposite arm __3__ inches from floor, keeping knee locked. Repeat __5__ times per set. Do __2-3__ sets per session.   Clam    Lie on side, legs bent 90.  Tighten core muscles.  Open top knee to ceiling, rotating leg outward. Touch toes to ankle of bottom leg. Close knees, rotating leg inward. Maintain hip position. Repeat __10-15__ times. Repeat on other side. Do __1__ sessions per day.  Piriformis Stretch   Lying on back, pull right knee toward opposite shoulder. Hold 30 seconds. Repeat 3 times. Do 2-3 sessions per day.

## 2017-12-29 ENCOUNTER — Encounter: Payer: Medicare HMO | Admitting: Physical Therapy

## 2018-01-09 ENCOUNTER — Telehealth: Payer: Self-pay | Admitting: Neurology

## 2018-01-09 NOTE — Telephone Encounter (Signed)
Please inform patient that Dr. Tomi Likens is out of the office and will be back next week to discuss alternative medication options, as I also do not see another medication mentioned.

## 2018-01-09 NOTE — Telephone Encounter (Signed)
Please advise.  I couldn't find the name of the other medication that she mentioned.

## 2018-01-09 NOTE — Telephone Encounter (Signed)
Please call patient

## 2018-01-10 NOTE — Telephone Encounter (Signed)
She can try topiramate 25mg  at bedtime.  In 4 weeks we can increase dose if needed.  Side effects may include numbness and tingling, so she should not be worried if she experiences this.  However, if she does experience this, it often resolves once her body gets used to the medication.

## 2018-01-10 NOTE — Telephone Encounter (Signed)
Called and spoke with Pt. I advised her I am unsure of which medication Dr Tomi Likens talked with her about. I advised Pt Dr Tomi Likens will return Monday, and I will call her when I have an answer. Pt was ok with that.

## 2018-01-11 ENCOUNTER — Telehealth: Payer: Self-pay | Admitting: Neurology

## 2018-01-11 MED ORDER — TOPIRAMATE 25 MG PO TABS: 25.0000 mg | ORAL_TABLET | Freq: Every day | ORAL | 3 refills | Status: DC

## 2018-01-11 NOTE — Telephone Encounter (Signed)
Called and LMOVM for pt to rtrn my call

## 2018-01-11 NOTE — Addendum Note (Signed)
Addended by: Clois Comber on: 01/11/2018 06:00 PM   Modules accepted: Orders

## 2018-01-11 NOTE — Telephone Encounter (Signed)
Called Pt, LMOVM advising of topiramate 25mg ,  possible side effects, and also to call us in 4 weeks if not any better,

## 2018-01-11 NOTE — Telephone Encounter (Signed)
Returning [place] Call she left message on VM

## 2018-01-15 ENCOUNTER — Ambulatory Visit
Admission: RE | Admit: 2018-01-15 | Discharge: 2018-01-15 | Disposition: A | Discharge status: Home / Self Care | Payer: Medicare HMO | Source: Ambulatory Visit | Attending: Neurology | Admitting: Neurology

## 2018-01-15 DIAGNOSIS — G44219 Episodic tension-type headache, not intractable: Secondary | ICD-10-CM

## 2018-01-15 DIAGNOSIS — R519 Headache, unspecified: Secondary | ICD-10-CM

## 2018-01-15 DIAGNOSIS — R51 Headache: Secondary | ICD-10-CM | POA: Diagnosis not present

## 2018-01-15 MED ORDER — GADOBENATE DIMEGLUMINE 529 MG/ML IV SOLN
18.0000 mL | Freq: Once | INTRAVENOUS | Status: AC | PRN
  Administered 2018-01-15: 18 mL via INTRAVENOUS

## 2018-01-17 ENCOUNTER — Telehealth: Payer: Self-pay | Admitting: *Deleted

## 2018-01-17 NOTE — Telephone Encounter (Signed)
Patient given results

## 2018-01-17 NOTE — Telephone Encounter (Signed)
-----   Message from Pieter Partridge, DO sent at 01/17/2018  7:21 AM EDT ----- MRI of brain reveals nothing concerning

## 2018-02-14 DIAGNOSIS — M1712 Unilateral primary osteoarthritis, left knee: Secondary | ICD-10-CM | POA: Diagnosis not present

## 2018-02-20 DIAGNOSIS — R413 Other amnesia: Secondary | ICD-10-CM | POA: Diagnosis not present

## 2018-02-20 DIAGNOSIS — M549 Dorsalgia, unspecified: Secondary | ICD-10-CM | POA: Diagnosis not present

## 2018-02-20 DIAGNOSIS — Z0000 Encounter for general adult medical examination without abnormal findings: Secondary | ICD-10-CM | POA: Diagnosis not present

## 2018-02-20 DIAGNOSIS — E785 Hyperlipidemia, unspecified: Secondary | ICD-10-CM | POA: Diagnosis not present

## 2018-02-20 DIAGNOSIS — E039 Hypothyroidism, unspecified: Secondary | ICD-10-CM | POA: Diagnosis not present

## 2018-02-20 DIAGNOSIS — I4891 Unspecified atrial fibrillation: Secondary | ICD-10-CM | POA: Diagnosis not present

## 2018-02-20 DIAGNOSIS — I10 Essential (primary) hypertension: Secondary | ICD-10-CM | POA: Diagnosis not present

## 2018-03-06 NOTE — Progress Notes (Signed)
NEUROLOGY FOLLOW UP NOTE  Robin Christensen MRN: [id number] DOB: [birth year]  Referring provider: Dr. Orland Mustard Primary care provider: Dr. Burgess Estelle is a 67 year old right-handed female with hypothyroidism, hyperlipidemia, hypertension, OSA, atrial fibrillation status post cardioversion and history of breast cancer status post mastectomy who follows up for tension-type headache.  UPDATE: She had side effects to nortriptyline and was switched to topiramate. Intensity:  Moderate to severe (overall less intense).  Not triggered by every little stressor. Duration:  1 to a few hours Frequency:  2 to 3 days a week Frequency of abortive medication: Tylenol 3 to 4 days a week Current NSAIDS:  no Current analgesics:  Tylenol Current triptans:  no Current ergotamine:  no Current anti-emetic:  no Current muscle relaxants:  no Current anti-anxiolytic:  no Current sleep aide:  no Current Antihypertensive medications:  Lasix Current Antidepressant medications:  no Current Anticonvulsant medications:  topiramate 25mg  at bedtime Current anti-CGRP:  no Current Vitamins/Herbal/Supplements:  no Current Antihistamines/Decongestants:  no Other therapy:  no Other medication:  No  MRI of brain without and with contrast from 01/15/18 was personally reviewed and was unremarkable except for incidental partial empty sella.  She was advised to follow up with her dentist to evaluate for TMJ dysfunction.  She hasn't had a chance to go to the walker.  She had overnight study and CPAP is working.  Caffeine:  no Alcohol:  no Smoker:  no Diet:  Hydrates Exercise:  Walks 3 miles 5 days a week Depression:  no; Anxiety:  no Other pain:  Mild neck pain Sleep hygiene:  Sleeps 4-5 hours straight, wakes up for 2 hours and then sleeps for another 2 hours.  She has OSA which is successfully treated with CPAP.  She reports concerning events.  She reports repeating things to other people after  just 15 minutes.  Another time, she briefly thought her grandchildren were in school but they were out for the summer.  She didn't recognize the refrigerator repairman.  Thyroid was normal.  She was found to have low B12 and started supplementation (20042mcg daily).  She is concerned because her mother and grandmother had dementia.    HISTORY: Onset:  2018.  At the time, they were severe and daily.  It was found that her OSA was uncontrolled and CPAP was adjusted.  Headaches resolved but returned about 2 months ago, albeit less severe.  CPAP was rechecked and is adequate. Location:  Bifrontal/across top of head Quality:  pressure Initial intensity:  mild.  She denies new headache, thunderclap headache or severe headache that wakes her from sleep. Aura:  no Prodrome:  no Postdrome:  no Associated symptoms:  Initially some nausea a year ago.  Now without nausea, vomiting, photophobia, phonophobia, autonomic symptoms or visual disturbance.  She denies associated unilateral numbness or weakness. Initial Duration:  1 hour to all day Initial Frequency:  3 days a week Initial Frequency of abortive medication: Extra-strength Tylenol 3 days a week Triggers/aggravating factors: any emotional stressor Relieving factors:  When she occupies herself with another activity. Activity:  Does not aggravate She endorses aching pain in her jaw, right worse than left.  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  She hasn't seen a dentist in years.  She has seen ophthalmology and had cataract surgery this year.  Vision is improved.    Past NSAIDS:  no Past analgesics:  no Past abortive triptans:  no Past  muscle relaxants:  no Past anti-emetic:  no Past antihypertensive medications:  Toprol XL Past antidepressant medications:  Nortriptyline 10mg  (hunger/weight gain, sluggishness) Past anticonvulsant medications:  no Past vitamins/Herbal/Supplements:  no Past antihistamines/decongestants:   no Other past therapies:  no  She has history of headaches off and on throughout her life.  Family history of headache:  No   PAST MEDICAL HISTORY: Past Medical History:  Diagnosis Date  . Cancer ([place])   . Dysrhythmia    AF  . Hyperlipidemia   . Hypertension   . Obesity   . PONV (postoperative nausea and vomiting)   . Sleep apnea    cpcap    PAST SURGICAL HISTORY: Past Surgical History:  Procedure Laterality Date  . BREAST SURGERY    . CARDIOVERSION N/A 06/26/2014   Procedure: CARDIOVERSION;  Surgeon: Laverda Page, MD;  Location: [facility];  Service: Cardiovascular;  Laterality: N/A;  . CHOLECYSTECTOMY    . TOTAL KNEE ARTHROPLASTY Right 05/03/2016   Procedure: TOTAL KNEE ARTHROPLASTY;  Surgeon: Melrose Nakayama, MD;  Location: [facility];  Service: Orthopedics;  Laterality: Right;    MEDICATIONS: Current Outpatient Medications on File Prior to Visit  Medication Sig Dispense Refill  . acetaminophen (TYLENOL) 500 MG tablet Take 500 mg by mouth 3 (three) times daily as needed for mild pain or headache.     Marland Kitchen ELIQUIS 5 MG TABS tablet Take 1 tablet (5 mg total) by mouth 2 (two) times daily. 60 tablet 0  . famotidine (PEPCID) 10 MG tablet Take 10 mg by mouth daily as needed for heartburn or indigestion.     . furosemide (LASIX) 40 MG tablet Take 40 mg by mouth daily.    Marland Kitchen HYDROcodone-acetaminophen (NORCO/VICODIN) 5-325 MG tablet Take 1-2 tablets by mouth every 4 (four) hours as needed (breakthrough pain). (Patient not taking: Reported on 10/27/2017) 40 tablet 0  . levothyroxine (SYNTHROID, LEVOTHROID) 75 MCG tablet Take 75 mcg by mouth daily before breakfast.    . methocarbamol (ROBAXIN) 500 MG tablet Take 1 tablet (500 mg total) by mouth every 6 (six) hours as needed for muscle spasms. (Patient not taking: Reported on 12/08/2017) 40 tablet 0  . metoprolol succinate (TOPROL-XL) 25 MG 24 hr tablet Take 25 mg by mouth at bedtime.    . nortriptyline (PAMELOR) 10 MG capsule Take 1  capsule (10 mg total) by mouth at bedtime. 30 capsule 3  . potassium chloride SA (K-DUR,KLOR-CON) 20 MEQ tablet Take 20 mEq by mouth as directed.    . pravastatin (PRAVACHOL) 40 MG tablet Take 40 mg by mouth daily.     . predniSONE (DELTASONE) 50 MG tablet Take 50 mg by mouth daily with breakfast.    . topiramate (TOPAMAX) 25 MG tablet Take 1 tablet (25 mg total) by mouth at bedtime. 120 tablet 3   No current facility-administered medications on file prior to visit.     ALLERGIES: Allergies  Allergen Reactions  . Xarelto [Rivaroxaban] Other (See Comments)    JOINT ACHES/PAIN    FAMILY HISTORY: Uncle:  [employer]  SOCIAL HISTORY: Social History   Socioeconomic History  . Marital status: Divorced    Spouse name: Not on file  . Number of children: 2  . Years of education: Not on file  . Highest education level: Bachelor's degree (e.g., BA, AB, BS)  Occupational History  . Occupation: RETIRED  Social Needs  . Financial resource strain: Not on file  . Food insecurity:    Worry: Not on file  Inability: Not on file  . Transportation needs:    Medical: Not on file    Non-medical: Not on file  Tobacco Use  . Smoking status: Never Smoker  . Smokeless tobacco: Never Used  Substance and Sexual Activity  . Alcohol use: Yes    Comment: once a year, maybe  . Drug use: No  . Sexual activity: Not on file  Lifestyle  . Physical activity:    Days per week: Not on file    Minutes per session: Not on file  . Stress: Not on file  Relationships  . Social connections:    Talks on phone: Not on file    Gets together: Not on file    Attends religious service: Not on file    Active member of club or organization: Not on file    Attends meetings of clubs or organizations: Not on file    Relationship status: Not on file  . Intimate partner violence:    Fear of current or ex partner: Not on file    Emotionally abused: Not on file    Physically abused: Not on file    Forced sexual  activity: Not on file  Other Topics Concern  . Not on file  Social History Narrative   Patient is right-handed. She avoids caffeine, walks 3 miles 5 days a week. She lives in a 2 story house. Prior to retirement, she was in Press photographer.    REVIEW OF SYSTEMS: Constitutional: No fevers, chills, or sweats, no generalized fatigue, change in appetite Eyes: No visual changes, double vision, eye pain Ear, nose and throat: No hearing loss, ear pain, nasal congestion, sore throat Cardiovascular: No chest pain, palpitations Respiratory:  No shortness of breath at rest or with exertion, wheezes GastrointestinaI: No nausea, vomiting, diarrhea, abdominal pain, fecal incontinence Genitourinary:  No dysuria, urinary retention or frequency Musculoskeletal:  No neck pain, back pain Integumentary: No rash, pruritus, skin lesions Neurological: as above Psychiatric: No depression, insomnia, anxiety Endocrine: No palpitations, fatigue, diaphoresis, mood swings, change in appetite, change in weight, increased thirst Hematologic/Lymphatic:  No purpura, petechiae. Allergic/Immunologic: no itchy/runny eyes, nasal congestion, recent allergic reactions, rashes  PHYSICAL EXAM: Blood pressure 94/68, pulse 61, height 5' 7.5" (1.715 m), weight 195 lb (88.5 kg), SpO2 98 %. General: No acute distress.  Patient appears well-groomed.   Head:  Normocephalic/atraumatic Face:  Tenderness to palpation of TMJ bilaterally, crepitus Eyes:  fundi examined but not visualized Neck: supple, no paraspinal tenderness, full range of motion Back: No paraspinal tenderness Heart: regular rate and rhythm Lungs: Clear to auscultation bilaterally. Vascular: No carotid bruits. Neurological Exam: Mental status: alert and oriented to person, place, and time, recent and remote memory intact, fund of knowledge intact, attention and concentration intact, speech fluent and not dysarthric, language intact. Cranial nerves: CN I: not tested CN  II: pupils equal, round and reactive to light, visual fields intact CN III, IV, VI:  full range of motion, no nystagmus, no ptosis CN V: facial sensation intact CN VII: upper and lower face symmetric CN VIII: hearing intact CN IX, X: gag intact, uvula midline CN XI: sternocleidomastoid and trapezius muscles intact CN XII: tongue midline Bulk & Tone: normal, no fasciculations. Motor:  5/5 throughout  Sensation: temperature and vibration sensation intact. Deep Tendon Reflexes:  2+ throughout, toes downgoing.  Finger to nose testing:  Without dysmetria.  Heel to shin:  Without dysmetria.  Gait:  Normal station and stride.  Able to turn and tandem walk. Romberg  negative.  IMPRESSION: 1.  Probably episodic tension-type headache, not intractable.  She has tenderness to palpation of her TMJs and teeth, raising possibility of TMJ dysfunction. 2.  Cognitive deficits.  Onset correlates with initiation of topiramate.  B12 deficiency is another possible etiology.  PLAN: 1.  In case topiramate is contributing to cognitive changes, we will discontinue it. 2.  Instead, we will start gabapentin 100mg  and titrate by 100mg  every week to goal of 300mg  at bedtime.   3.  Limit Tylenol to no more than 2 days out of week to prevent rebound headache 4.  Keep headache diary 5.  Continue B12 supplementation 6.  Follow up in 3 to 4 months.  Will reassess memory/cognition.  Thank you for allowing me to take part in the care of this patient.  Metta Clines, DO  CC: London Pepper, MD

## 2018-03-07 ENCOUNTER — Encounter: Payer: Self-pay | Admitting: Neurology

## 2018-03-07 ENCOUNTER — Ambulatory Visit: Payer: Medicare HMO | Admitting: Neurology

## 2018-03-07 VITALS — BP 94/68 | HR 61 | Ht 67.5 in | Wt 195.0 lb

## 2018-03-07 DIAGNOSIS — R4189 Other symptoms and signs involving cognitive functions and awareness: Secondary | ICD-10-CM

## 2018-03-07 DIAGNOSIS — E538 Deficiency of other specified B group vitamins: Secondary | ICD-10-CM

## 2018-03-07 DIAGNOSIS — G44219 Episodic tension-type headache, not intractable: Secondary | ICD-10-CM

## 2018-03-07 MED ORDER — GABAPENTIN 100 MG PO CAPS: ORAL_CAPSULE | ORAL | 0 refills | Status: DC

## 2018-03-07 NOTE — Patient Instructions (Signed)
1.  The forgetfulness may be side effect of topiramate.  We will stop topiramate.  Instead, start gabapentin 100mg :  Take 1 capsule at bedtime for 7 days  Then 2 capsules at bedtime for 7 days  Then 3 capsules at bedtime If you feel any side effects such as dizziness or drowsiness, then reduce back to previous dose and remain on that dose for at least 4 weeks. 2.  Limit Tylenol to no more than 2 days out of week to prevent rebound headache 3.  Keep headache diary 4.  Continue B12. 5.  Follow up in 3 to 4 months and we will reassess memory at that time.

## 2018-03-09 ENCOUNTER — Telehealth: Payer: Self-pay | Admitting: Neurology

## 2018-03-09 NOTE — Telephone Encounter (Signed)
Patient called and left a voicemail stating that she was put on the gabapentin last visit and she has broken out in a rash all over and her lips feel funny. She took benadryl but it has not helped. Please call her back at [phone]. Thanks.

## 2018-03-09 NOTE — Telephone Encounter (Signed)
Called and LMOVM on home ph# for Pt to rtrn my call

## 2018-03-09 NOTE — Telephone Encounter (Signed)
Attempted to reach Pt at number she left [phone], the number lister on her chart (h) [phone], LM on both VM, also called son, Jocie Meroney and LMOVM

## 2018-03-09 NOTE — Telephone Encounter (Signed)
She should of course discontinue gabapentin.  If Benadryl was ineffective, I recommend going to Urgent Care for evaluation.  They may be able to give her a shot.  After she takes care of that, she should contact us on Monday for an alternative medication.

## 2018-03-12 NOTE — Telephone Encounter (Signed)
Start Cymbalta 30mg  daily for 2 weeks, then increase to 60mg  daily.

## 2018-03-12 NOTE — Telephone Encounter (Signed)
Called and spoke with Robin Christensen. Confirmed she has d/c gabapentin. Hives have resolved.  I am to call Robin Christensen back with recommendation from Dr. Tomi Likens on another preventative for headaches

## 2018-03-13 MED ORDER — DULOXETINE HCL 30 MG PO CPEP: 30.0000 mg | ORAL_CAPSULE | Freq: Every day | ORAL | 0 refills | Status: DC

## 2018-03-13 NOTE — Addendum Note (Signed)
Addended by: Clois Comber on: 03/13/2018 04:54 PM   Modules accepted: Orders

## 2018-03-13 NOTE — Telephone Encounter (Signed)
Called and LMOVM for Pt to return my call about new abortive.

## 2018-03-26 DIAGNOSIS — Z23 Encounter for immunization: Secondary | ICD-10-CM | POA: Diagnosis not present

## 2018-03-26 DIAGNOSIS — E538 Deficiency of other specified B group vitamins: Secondary | ICD-10-CM | POA: Diagnosis not present

## 2018-04-04 IMAGING — CR DG CHEST 2V
2 series · 2 of 2 positions shown · non-contrast
Comparison: None.

CLINICAL DATA: Preop for knee surgery.

EXAM:
CHEST  2 VIEW

[w chest pa]
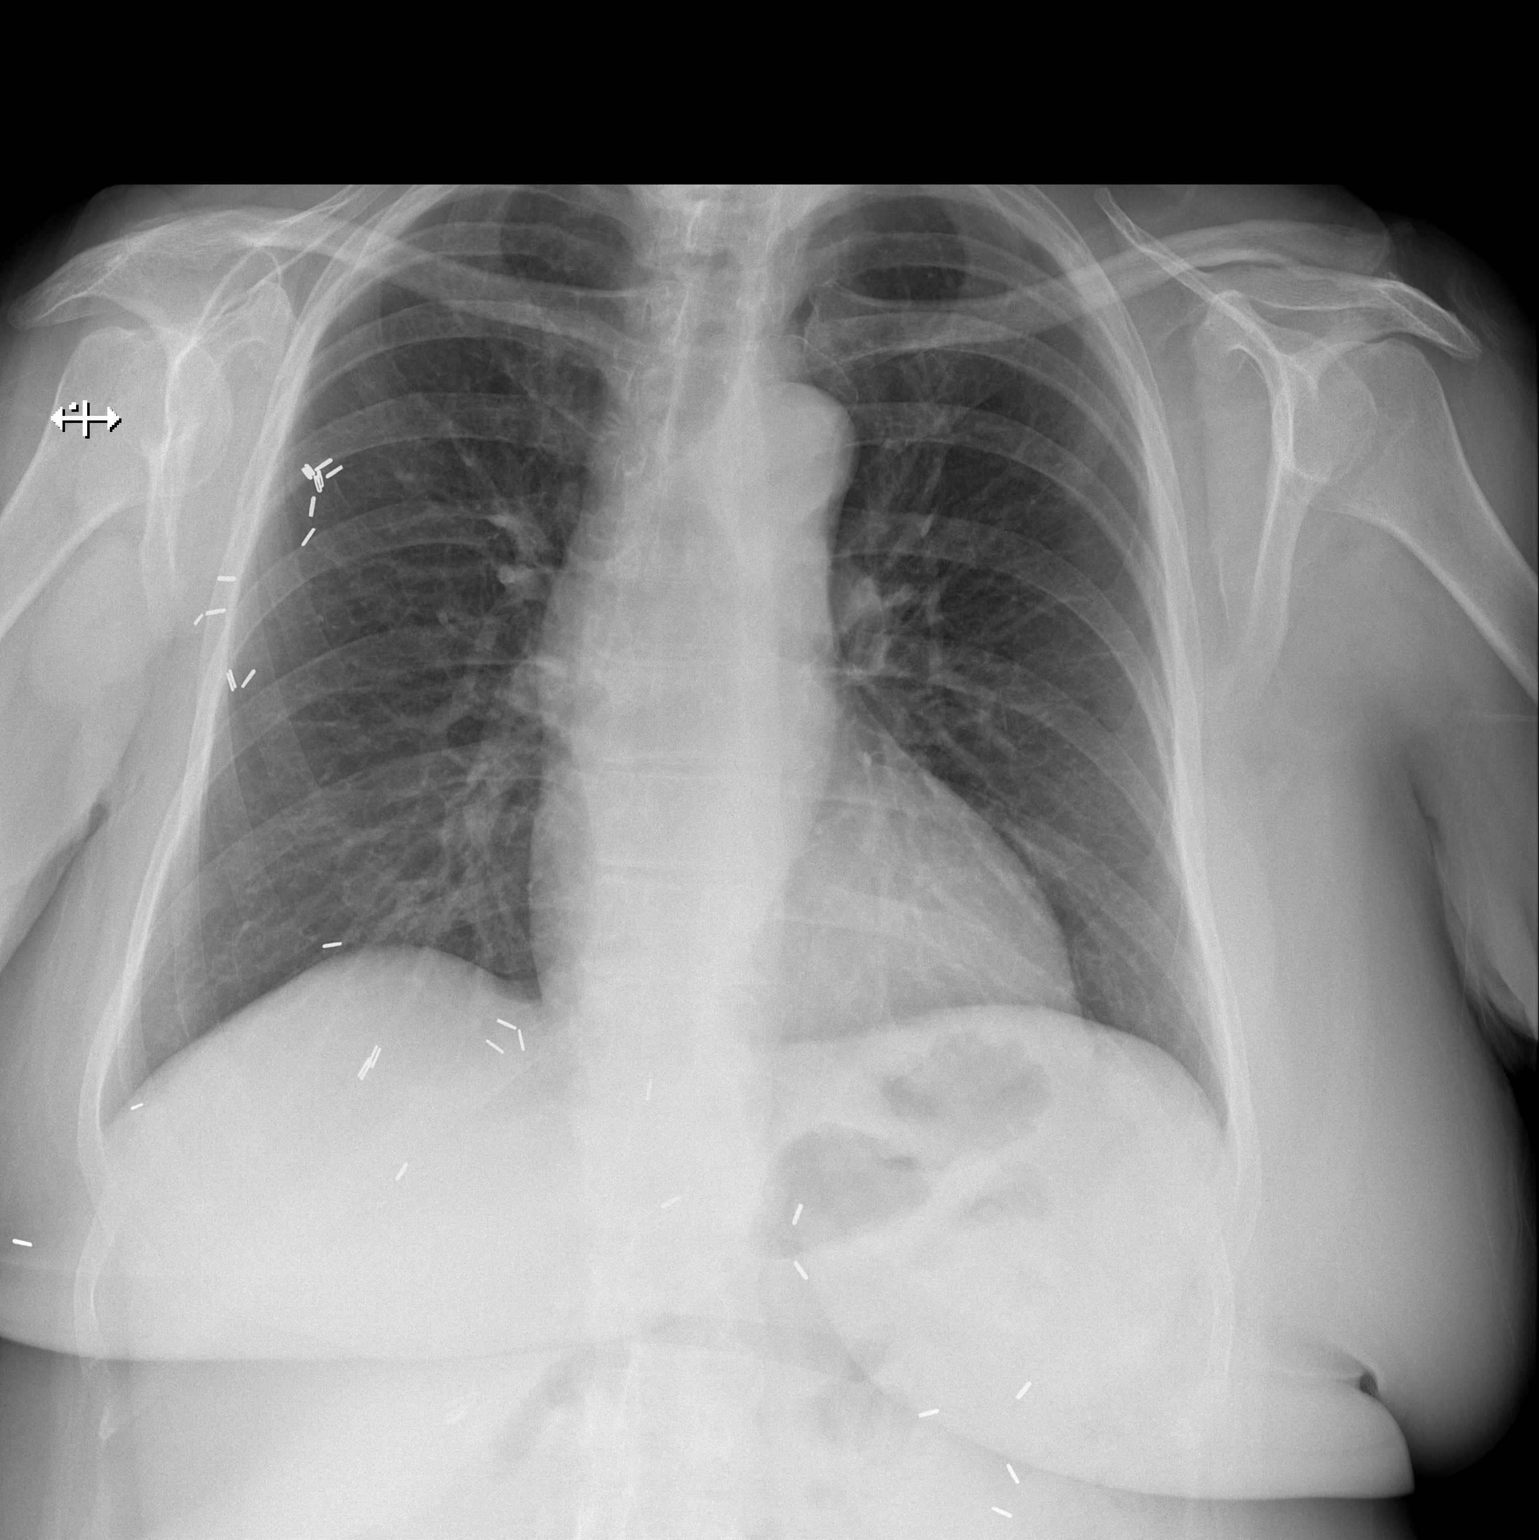

[w chest lat]
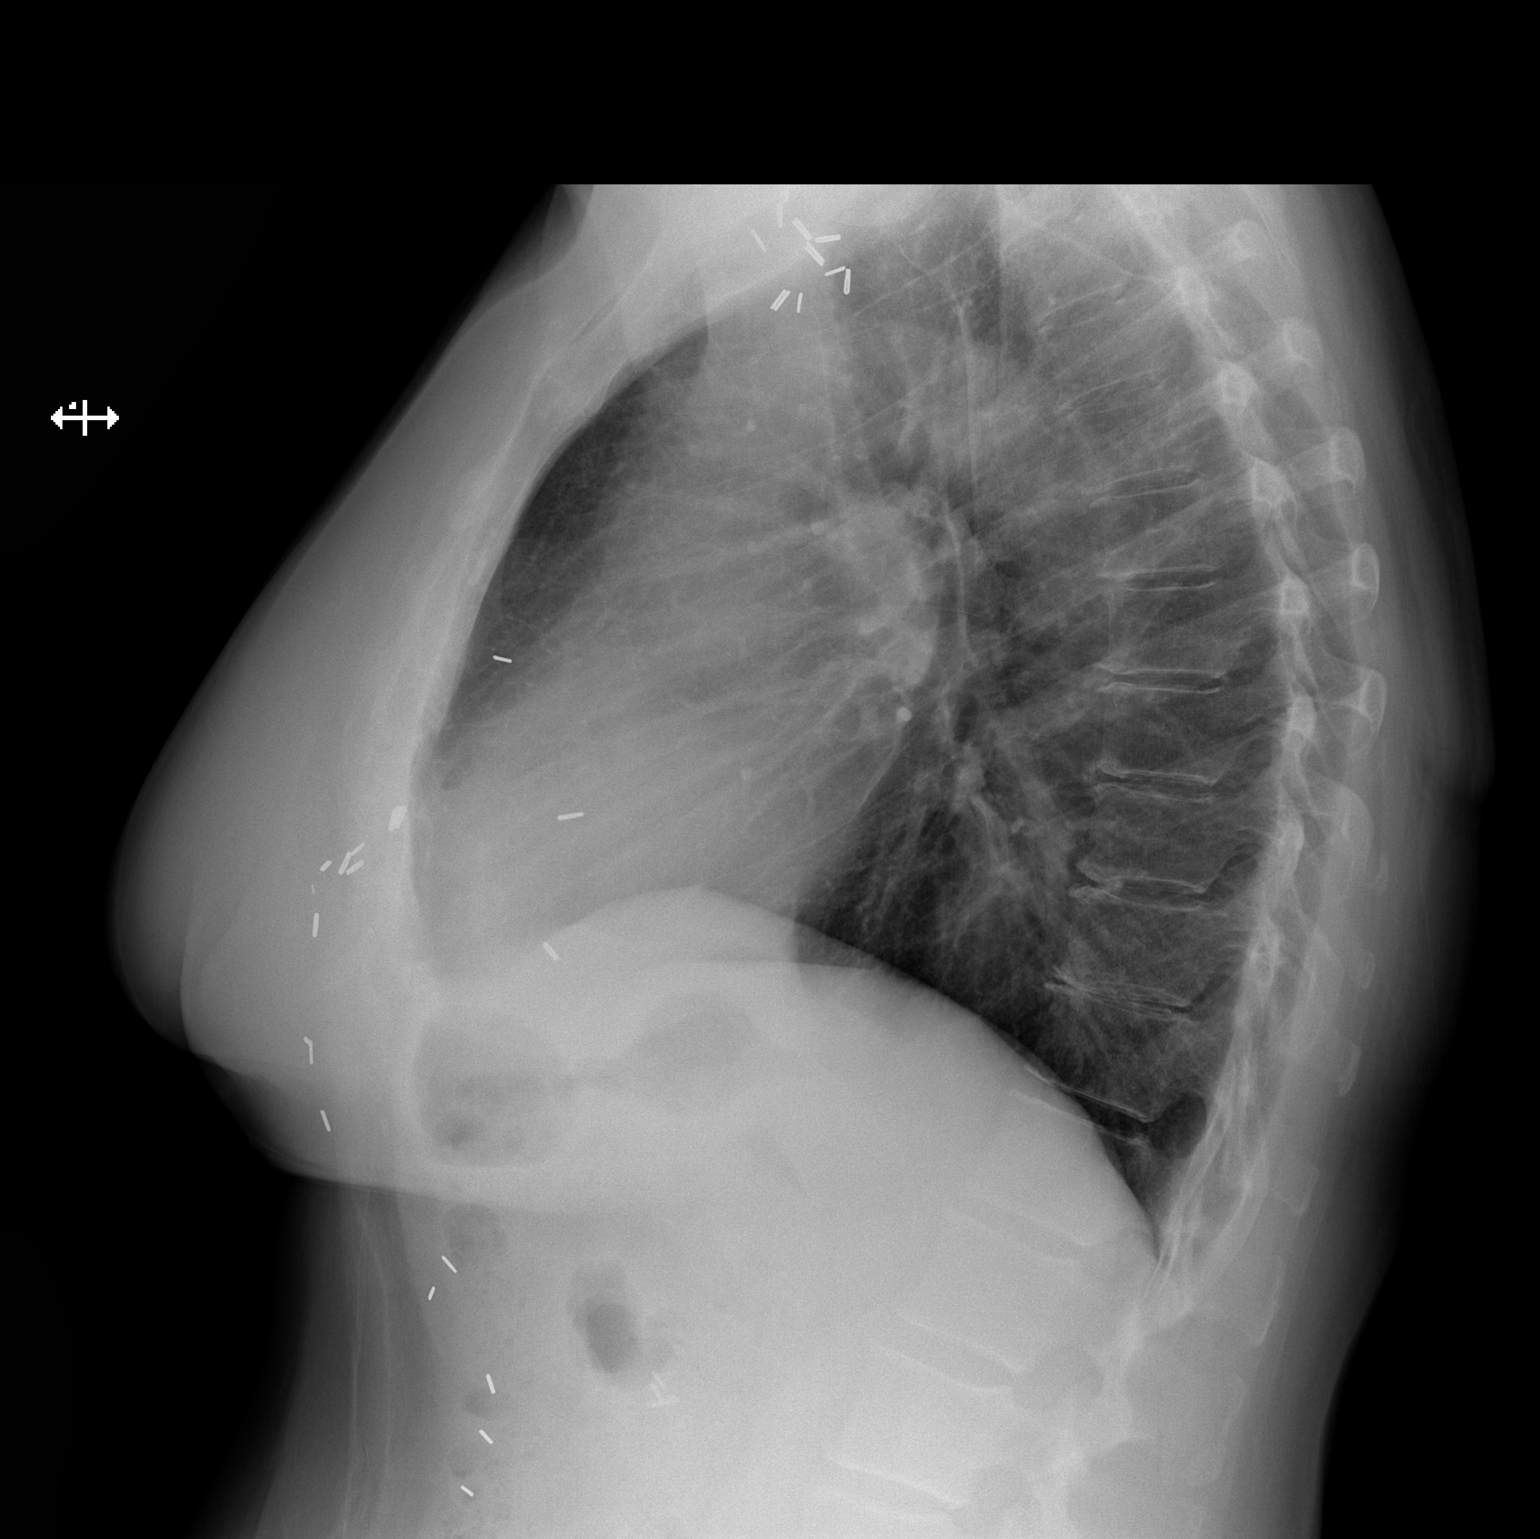

[2 of 2 positions shown; findings below may reference images not displayed]

FINDINGS: There is no focal parenchymal opacity. There is no pleural effusion
or pneumothorax. The heart and mediastinal contours are
unremarkable.

Multiple surgical clips in the right axilla and right inferior chest
wall and upper abdomen.

The osseous structures are unremarkable.
IMPRESSION: No active cardiopulmonary disease.

## 2018-04-10 ENCOUNTER — Other Ambulatory Visit: Payer: Self-pay | Admitting: Neurology

## 2018-04-10 MED ORDER — DULOXETINE HCL 60 MG PO CPEP: 60.0000 mg | ORAL_CAPSULE | Freq: Every day | ORAL | 2 refills | Status: DC

## 2018-04-25 DIAGNOSIS — M1712 Unilateral primary osteoarthritis, left knee: Secondary | ICD-10-CM | POA: Diagnosis not present

## 2018-05-08 DIAGNOSIS — H5210 Myopia, unspecified eye: Secondary | ICD-10-CM | POA: Diagnosis not present

## 2018-05-11 ENCOUNTER — Telehealth: Payer: Self-pay | Admitting: Neurology

## 2018-05-11 NOTE — Telephone Encounter (Signed)
Called and spoke with Pt, advised her to have the notes sent to [name].

## 2018-05-11 NOTE — Telephone Encounter (Signed)
Patient wants to talk to someone about what her Eye Dr told her. "that she is dying in front of his eyes" she needs to know what Dr Tomi Likens would like her to do

## 2018-05-14 DIAGNOSIS — Z131 Encounter for screening for diabetes mellitus: Secondary | ICD-10-CM | POA: Diagnosis not present

## 2018-05-14 DIAGNOSIS — Z853 Personal history of malignant neoplasm of breast: Secondary | ICD-10-CM | POA: Diagnosis not present

## 2018-05-14 DIAGNOSIS — Z1231 Encounter for screening mammogram for malignant neoplasm of breast: Secondary | ICD-10-CM | POA: Diagnosis not present

## 2018-05-18 DIAGNOSIS — H1133 Conjunctival hemorrhage, bilateral: Secondary | ICD-10-CM | POA: Diagnosis not present

## 2018-05-22 DIAGNOSIS — H119 Unspecified disorder of conjunctiva: Secondary | ICD-10-CM | POA: Diagnosis not present

## 2018-05-22 DIAGNOSIS — G4733 Obstructive sleep apnea (adult) (pediatric): Secondary | ICD-10-CM | POA: Diagnosis not present

## 2018-05-22 DIAGNOSIS — I4891 Unspecified atrial fibrillation: Secondary | ICD-10-CM | POA: Diagnosis not present

## 2018-05-22 DIAGNOSIS — Z7901 Long term (current) use of anticoagulants: Secondary | ICD-10-CM | POA: Diagnosis not present

## 2018-05-25 DIAGNOSIS — H1133 Conjunctival hemorrhage, bilateral: Secondary | ICD-10-CM | POA: Diagnosis not present

## 2018-06-04 DIAGNOSIS — G44229 Chronic tension-type headache, not intractable: Secondary | ICD-10-CM | POA: Diagnosis not present

## 2018-06-04 DIAGNOSIS — G4733 Obstructive sleep apnea (adult) (pediatric): Secondary | ICD-10-CM | POA: Diagnosis not present

## 2018-06-04 DIAGNOSIS — I480 Paroxysmal atrial fibrillation: Secondary | ICD-10-CM | POA: Diagnosis not present

## 2018-06-04 DIAGNOSIS — H11433 Conjunctival hyperemia, bilateral: Secondary | ICD-10-CM | POA: Diagnosis not present

## 2018-06-06 NOTE — Progress Notes (Signed)
NEUROLOGY FOLLOW UP OFFICE NOTE  Robin Christensen [id number]  HISTORY OF PRESENT ILLNESS: Robin Christensen is a 67 year old right-handed female with hypothyroidism, hyperlipidemia, hypertension, OSA, atrial fibrillation status post cardioversion and history of breast cancer status post mastectomy who follows up for tension type headache.  UPDATE: In August, she endorsed cognitive changes.  She would repeat things to other people after just 15 minutes.  Another time, she briefly thought her grandchildren were in school but they were out for the summer.  She did recognize the refrigerator repair man.  She was started on B12 supplementation.  Due to these changes, topiramate was discontinued and she was started on gabapentin.  However, she is now not on gabapentin.  She is currently on Cymbalta.  Sleep study was okay.  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  She has a follow up appointment in a couple of weeks. Workup is ongoing.    Intensity:  mild Duration:  All day Frequency:  3 to 4 days a week Frequency of abortive medication: Tylenol one or two times a week. Current NSAIDS: None Current analgesics: Tylenol Current triptans: None Current ergotamine: None Current anti-emetic: None Current muscle relaxants: None Current anti-anxiolytic: None Current sleep aide: None Current Antihypertensive medications: Lasix Current Antidepressant medications: Cymbalta 60mg  Current Anticonvulsant medications: none Current anti-CGRP: None Current Vitamins/Herbal/Supplements: None Current Antihistamines/Decongestants: None Other therapy: None  Caffeine: No Alcohol: No Smoker: No Diet: Hydrates Exercise: Walks 3 miles 5 days a week Depression: No; Anxiety: No Other pain: Mild neck pain Sleep hygiene: Sleeps 4 to 5 hours straight, wakes up for 2 hours and then sleeps for another 2 hours.  She has OSA which is successfully treated with  CPAP.  She was advised to follow up with her dentist to evaluate for TMJ dysfunction.  She hasn't had a chance to go to the walker.  HISTORY: Onset:  2018.  At the time, they were severe and daily.  It was found that her OSA was uncontrolled and CPAP was adjusted.  Headaches resolved but returned about 2 months ago, albeit less severe.  CPAP was rechecked and is adequate. Location:  Bifrontal/across top of head Quality:  pressure Initial intensity:  mild.  She denies new headache, thunderclap headache or severe headache that wakes her from sleep. Aura:  no Prodrome:  no Postdrome:  no Associated symptoms:  Initially some nausea a year ago.  Now without nausea, vomiting, photophobia, phonophobia, autonomic symptoms or visual disturbance.  She denies associated unilateral numbness or weakness. Initial Duration:  1 hour to all day Initial Frequency:  3 days a week Initial Frequency of abortive medication: Extra-strength Tylenol 3 days a week Triggers/aggravating factors: any emotional stressor Relieving factors:  When she occupies herself with another activity. Activity:  Does not aggravate She endorses aching pain in her jaw, right worse than left.  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  She hasn't seen a dentist in years.  She has seen ophthalmology and had cataract surgery this year.  Vision is improved.    MRI of brain without and with contrast from 01/15/18 was personally reviewed and was unremarkable except for incidental partial empty sella.  Past NSAIDS:  no Past analgesics:  no Past abortive triptans:  no Past muscle relaxants:  no Past anti-emetic:  no Past antihypertensive medications:  Toprol XL Past antidepressant medications:  Nortriptyline 10mg  (hunger/weight gain, sluggishness) Past anticonvulsant medications:  Topiramate 25 mg at bedtime (discontinued due to cognitive deficits) Past vitamins/Herbal/Supplements:  no Past  antihistamines/decongestants:  no Other past therapies:  no  She has history of headaches off and on throughout her life.  Family history of headache:  No  PAST MEDICAL HISTORY: Past Medical History:  Diagnosis Date  . Cancer ([place])   . Dysrhythmia    AF  . Hyperlipidemia   . Hypertension   . Obesity   . PONV (postoperative nausea and vomiting)   . Sleep apnea    cpcap    MEDICATIONS: Current Outpatient Medications on File Prior to Visit  Medication Sig Dispense Refill  . acetaminophen (TYLENOL) 500 MG tablet Take 500 mg by mouth 3 (three) times daily as needed for mild pain or headache.     . DULoxetine (CYMBALTA) 60 MG capsule Take 1 capsule (60 mg total) by mouth daily. 30 capsule 2  . ELIQUIS 5 MG TABS tablet Take 1 tablet (5 mg total) by mouth 2 (two) times daily. 60 tablet 0  . famotidine (PEPCID) 10 MG tablet Take 10 mg by mouth daily as needed for heartburn or indigestion.     . furosemide (LASIX) 40 MG tablet Take 40 mg by mouth daily.    Marland Kitchen gabapentin (NEURONTIN) 100 MG capsule Take 1 capsule at bedtime for 1 wk, then 2 capsules at bedtime for 1 wk, then 3 capsules at bedtime 90 capsule 0  . HYDROcodone-acetaminophen (NORCO/VICODIN) 5-325 MG tablet Take 1-2 tablets by mouth every 4 (four) hours as needed (breakthrough pain). (Patient not taking: Reported on 10/27/2017) 40 tablet 0  . levothyroxine (SYNTHROID, LEVOTHROID) 75 MCG tablet Take 75 mcg by mouth daily before breakfast.    . methocarbamol (ROBAXIN) 500 MG tablet Take 1 tablet (500 mg total) by mouth every 6 (six) hours as needed for muscle spasms. (Patient not taking: Reported on 12/08/2017) 40 tablet 0  . metoprolol succinate (TOPROL-XL) 25 MG 24 hr tablet Take 25 mg by mouth at bedtime.    . nortriptyline (PAMELOR) 10 MG capsule Take 1 capsule (10 mg total) by mouth at bedtime. 30 capsule 3  . potassium chloride SA (K-DUR,KLOR-CON) 20 MEQ tablet Take 20 mEq by mouth as directed.    . pravastatin (PRAVACHOL) 40 MG  tablet Take 40 mg by mouth daily.     . predniSONE (DELTASONE) 50 MG tablet Take 50 mg by mouth daily with breakfast.     No current facility-administered medications on file prior to visit.     ALLERGIES: Allergies  Allergen Reactions  . Xarelto [Rivaroxaban] Other (See Comments)    JOINT ACHES/PAIN    FAMILY HISTORY: No family history of headache  SOCIAL HISTORY: Social History   Socioeconomic History  . Marital status: Divorced    Spouse name: Not on file  . Number of children: 2  . Years of education: Not on file  . Highest education level: Bachelor's degree (e.g., BA, AB, BS)  Occupational History  . Occupation: RETIRED  Social Needs  . Financial resource strain: Not on file  . Food insecurity:    Worry: Not on file    Inability: Not on file  . Transportation needs:    Medical: Not on file    Non-medical: Not on file  Tobacco Use  . Smoking status: Never Smoker  . Smokeless tobacco: Never Used  Substance and Sexual Activity  . Alcohol use: Yes    Comment: once a year, maybe  . Drug use: No  . Sexual  activity: Not on file  Lifestyle  . Physical activity:    Days per week: Not on file    Minutes per session: Not on file  . Stress: Not on file  Relationships  . Social connections:    Talks on phone: Not on file    Gets together: Not on file    Attends religious service: Not on file    Active member of club or organization: Not on file    Attends meetings of clubs or organizations: Not on file    Relationship status: Not on file  . Intimate partner violence:    Fear of current or ex partner: Not on file    Emotionally abused: Not on file    Physically abused: Not on file    Forced sexual activity: Not on file  Other Topics Concern  . Not on file  Social History Narrative   Patient is right-handed. She avoids caffeine, walks 3 miles 5 days a week. She lives in a 2 story house. Prior to retirement, she was in Press photographer.    REVIEW OF  SYSTEMS: Constitutional: No fevers, chills, or sweats, no generalized fatigue, change in appetite Eyes: No visual changes, double vision, eye pain.   Ear, nose and throat: No hearing loss, ear pain, nasal congestion, sore throat Cardiovascular: No chest pain, palpitations Respiratory:  No shortness of breath at rest or with exertion, wheezes GastrointestinaI: No nausea, vomiting, diarrhea, abdominal pain, fecal incontinence Genitourinary:  No dysuria, urinary retention or frequency Musculoskeletal:  No neck pain, back pain Integumentary: No rash, pruritus, skin lesions Neurological: as above Psychiatric: No depression, insomnia, anxiety Endocrine: No palpitations, fatigue, diaphoresis, mood swings, change in appetite, change in weight, increased thirst Hematologic/Lymphatic:  No purpura, petechiae. Allergic/Immunologic: no itchy/runny eyes, nasal congestion, recent allergic reactions, rashes  PHYSICAL EXAM: Blood pressure 122/78, pulse 61, height 5' 7.5" (1.715 m), weight 195 lb (88.5 kg), SpO2 97 %. General: No acute distress.  Patient appears well-groomed.   Head:  Normocephalic/atraumatic Eyes:  Fundi examined but not visualized Neck: supple, no paraspinal tenderness, full range of motion Heart:  Regular rate and rhythm Lungs:  Clear to auscultation bilaterally Back: No paraspinal tenderness Neurological Exam: alert and oriented to person, place, and time. Attention span and concentration intact, recent and remote memory intact, fund of knowledge intact.  Speech fluent and not dysarthric, language intact.  CN II-XII intact. Bulk and tone normal, muscle strength 5/5 throughout.  Sensation to light touch intact.  Deep tendon reflexes 2+ throughout, toes downgoing.  Finger to nose and heel to shin testing intact.  Gait normal, Romberg negative.  IMPRESSION: 1.  Possible tension type headache.    PLAN: 1.  For preventative management, she will continue duloxetine 60mg  daily.  If  headaches not improved in 2 weeks, we will switch to zonisamide 50mg  daily. 2.  For abortive therapy, Tylenol 3.  Limit use of pain relievers to no more than 2 days out of week to prevent risk of rebound or medication-overuse headache. 4.  Keep headache diary 5.  Exercise, hydration, caffeine cessation, sleep hygiene, monitor for and avoid triggers 6.  Consider:  magnesium citrate 400mg  daily, riboflavin 400mg  daily, and coenzyme Q10 100mg  three times daily 7.  Will need to obtain notes from her ophthalmologist and cardiologist 8. Follow up in 3 to 4 months  Metta Clines, DO  CC: London Pepper, MD

## 2018-06-07 ENCOUNTER — Encounter: Payer: Self-pay | Admitting: Neurology

## 2018-06-07 ENCOUNTER — Ambulatory Visit: Payer: Medicare HMO | Admitting: Neurology

## 2018-06-07 VITALS — BP 122/78 | HR 61 | Ht 67.5 in | Wt 195.0 lb

## 2018-06-07 DIAGNOSIS — G44219 Episodic tension-type headache, not intractable: Secondary | ICD-10-CM

## 2018-06-07 DIAGNOSIS — H44813 Hemophthalmos, bilateral: Secondary | ICD-10-CM

## 2018-06-07 NOTE — Patient Instructions (Signed)
1.  Continue duloxetine 60mg  daily.  If headaches still frequent in 2 weeks, contact me and we can try something else 2.  I will get notes from your eye doctor and cardiologist 3.  MRI of brain with and without contrast was overall unremarkable. It showed empty sella, which is typically incidental finding. 4.  Limit use of pain relievers to no more than 2 days out of week to prevent risk of rebound or medication-overuse headache. 5.  Follow up in 3 to 4 months.

## 2018-06-08 DIAGNOSIS — H1133 Conjunctival hemorrhage, bilateral: Secondary | ICD-10-CM | POA: Diagnosis not present

## 2018-06-13 DIAGNOSIS — H3560 Retinal hemorrhage, unspecified eye: Secondary | ICD-10-CM | POA: Diagnosis not present

## 2018-06-13 DIAGNOSIS — R51 Headache: Secondary | ICD-10-CM | POA: Diagnosis not present

## 2018-07-02 ENCOUNTER — Telehealth: Payer: Self-pay | Admitting: Neurology

## 2018-07-02 DIAGNOSIS — G44219 Episodic tension-type headache, not intractable: Secondary | ICD-10-CM

## 2018-07-02 NOTE — Telephone Encounter (Signed)
Patient is calling in having issues with the duloxetine 60mg  medication; diarrhea for 3 weeks every day and red eyes. Wants to know if she can stop taking it. Please call her back at [phone]. Thanks!

## 2018-07-02 NOTE — Telephone Encounter (Signed)
Pt has 2- or 3 of the 30 mg caps left, she wants to know if she should take those and then stop?

## 2018-07-02 NOTE — Telephone Encounter (Signed)
That would be okay.  Instead, we can start zonisamide 50mg  at bedtime.

## 2018-07-03 MED ORDER — ZONISAMIDE 50 MG PO CAPS: 50.0000 mg | ORAL_CAPSULE | Freq: Every day | ORAL | 3 refills | Status: DC

## 2018-07-03 NOTE — Telephone Encounter (Signed)
Called and advised Pt 

## 2018-07-09 DIAGNOSIS — H1133 Conjunctival hemorrhage, bilateral: Secondary | ICD-10-CM | POA: Diagnosis not present

## 2018-07-24 ENCOUNTER — Other Ambulatory Visit: Payer: Self-pay

## 2018-07-24 ENCOUNTER — Encounter: Payer: Self-pay | Admitting: Physical Therapy

## 2018-07-24 ENCOUNTER — Ambulatory Visit: Payer: Medicare HMO | Admitting: Physical Therapy

## 2018-07-24 DIAGNOSIS — M5441 Lumbago with sciatica, right side: Secondary | ICD-10-CM | POA: Diagnosis not present

## 2018-07-24 DIAGNOSIS — M6281 Muscle weakness (generalized): Secondary | ICD-10-CM

## 2018-07-24 DIAGNOSIS — G8929 Other chronic pain: Secondary | ICD-10-CM | POA: Diagnosis not present

## 2018-07-24 DIAGNOSIS — R2689 Other abnormalities of gait and mobility: Secondary | ICD-10-CM

## 2018-07-24 NOTE — Patient Instructions (Signed)
Access Code: [email or web address]  URL: [email or web address]  Date: 07/24/2018  Prepared by: Faustino Congress   Exercises  Supine Piriformis Stretch - 3 reps - 1 sets - 30 sec hold - 2x daily - 7x weekly  Standing Glute Med Mobilization with Small Ball on Wall - 2-3 reps - 1 sets - 30-60 sec hold - 2x daily - 7x weekly  Patient Education  Trigger Point Dry Needling

## 2018-07-24 NOTE — Therapy (Addendum)
[facility] [place], [place], [address] Phone: [phone]   Fax:  [phone]  Physical Therapy Evaluation  Patient Details  Name: Robin Christensen MRN: [id number] Date of Birth: May 03, 1951 Referring Provider (PT): Dr. Melrose Nakayama   Encounter Date: 07/24/2018  PT End of Session - 07/24/18 1020    Visit Number  1    Number of Visits  12    Date for PT Re-Evaluation  09/04/18    PT Start Time  0932    PT Stop Time  1028    PT Time Calculation (min)  56 min    Activity Tolerance  Patient tolerated treatment well;Patient limited by pain    Behavior During Therapy  [facility] for tasks assessed/performed       Past Medical History:  Diagnosis Date  . Cancer ([place])   . Dysrhythmia    AF  . Hyperlipidemia   . Hypertension   . Obesity   . PONV (postoperative nausea and vomiting)   . Sleep apnea    cpcap    Past Surgical History:  Procedure Laterality Date  . BREAST SURGERY    . CARDIOVERSION N/A 06/26/2014   Procedure: CARDIOVERSION;  Surgeon: Laverda Page, MD;  Location: [facility];  Service: Cardiovascular;  Laterality: N/A;  . CHOLECYSTECTOMY    . 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   Subjective Assessment - 07/24/18 0937    Subjective  Pt is a 68 y/o female who presents to OPPT for LBP with Rt radiculopathy.  Pain is chronic but with recent exacerbation x 2 weeks.  Pt reports pain in Rt buttock, and has tried heat without relief.  Pt reports pain is the worst with uncrossing legs and bending forward.      Pertinent History  sch for Lt TKA 08/14/18    Limitations  Sitting;Standing    How long can you sit comfortably?  pain with transition to/from stand    Patient Stated Goals  improve pain, wants dry needling; lost  exercises    Currently in Pain?  Yes    Pain Score  7    up to 10/10; at best 5/10   Pain Location  Buttocks    Pain Orientation  Right    Pain Descriptors / Indicators  Burning    Pain Type  Chronic pain;Acute pain    Pain Onset  1 to 4 weeks ago    Pain Frequency  Constant    Aggravating Factors   forward bending, sit to/from stand    Pain Relieving Factors  dry needling, medication         [facility] PT Assessment - 07/24/18 0941      Assessment   Medical Diagnosis  LBP with Rt radiculopathy    Referring Provider (PT)  Dr. Melrose Nakayama    Onset Date/Surgical Date  --   2-3 weeks   Hand Dominance  Right    Next MD Visit  PRN    Prior Therapy  June 2019: at this clinic      Precautions   Precautions  None      Restrictions   Weight Bearing Restrictions  No      Balance Screen   Has the patient fallen in the past 6 months  No  Has the patient had a decrease in activity level because of a fear of falling?   No    Is the patient reluctant to leave their home because of a fear of falling?   No      Home Environment   Living Environment  Private residence    Living Arrangements  Alone    Type of [place] Access  Level entry    Home Layout  Two level;Able to live on main level with bedroom/bathroom   computer upstairs; some difficulty with stairs     Prior Function   Level of Independence  Independent    Vocation  Retired    [employer]  retired from [employer]    Leisure  spend time with friends, movies; walking 3 miles/day 5-7 days/wk; [facility] x 1 hour (hasn't exercised in 2 months)      Cognition   Overall Cognitive Status  Within Functional Limits for tasks assessed      Observation/Other Assessments   Focus on Therapeutic Outcomes (FOTO)   53 (47% limited; predicted 32% limited)      Posture/Postural Control   Posture/Postural Control  Postural limitations    Postural Limitations  Rounded Shoulders;Forward head       ROM / Strength   AROM / PROM / Strength  AROM;Strength      AROM   AROM Assessment Site  Lumbar    Lumbar Flexion  WNL   significant pain with returning upright   Lumbar Extension  limited 25%; no pain    Lumbar - Right Side Bend  WNL    Lumbar - Left Side Bend  WNL   Rt sided pain   Lumbar - Right Rotation  WNL    Lumbar - Left Rotation  WNL      Strength   Overall Strength Comments  deferred additional testing due to elevated pain    Strength Assessment Site  Hip;Knee    Right/Left Hip  Right;Left    Right Hip Flexion  4/5    Right Hip ABduction  3/5    Left Hip Flexion  4/5    Right/Left Knee  Right;Left    Right Knee Extension  5/5    Left Knee Extension  5/5      Special Tests    Special Tests  Lumbar    Lumbar Tests  Straight Leg Raise      Straight Leg Raise   Findings  Negative      Ambulation/Gait   Gait Pattern  Decreased stance time - right;Decreased step length - left;Antalgic                Objective measurements completed on examination: See above findings.      [facility] Adult PT Treatment/Exercise - 07/24/18 0941      Modalities   Modalities  Moist Heat;Electrical Stimulation      Moist Heat Therapy   Number Minutes Moist Heat  15 Minutes    Moist Heat Location  Hip   and Rt glutes     Electrical Stimulation   Electrical Stimulation Location  Rt buttock    Electrical Stimulation Action  IFC    Electrical Stimulation Parameters  to tolerance    Electrical Stimulation Goals  Pain;Tone      Manual Therapy   Manual Therapy  Soft tissue mobilization    Manual therapy comments  skilled palpation and monitoring of soft tissue during DN  Soft tissue mobilization  Rt glutes and piriformis       Addendum: Dry Needling performed with verbal consent from pt.  Handout provided.  Muscles Treated: Gluteus Med, Gluteus Max, Piriformis with twitch responses and palpable incr mm length following.  Laureen Abrahams, PT, DPT 08/01/18 12:37  PM       PT Education - 07/24/18 1020    Education Details  HEP, DN    Person(s) Educated  Patient    Methods  Explanation    Comprehension  Verbalized understanding          PT Long Term Goals - 07/24/18 1338      PT LONG TERM GOAL #1   Title  independent with HEP    Status  New    Target Date  09/04/18      PT LONG TERM GOAL #2   Title  FOTO score improved to </= 32% limited for improved function    Status  New    Target Date  09/04/18      PT LONG TERM GOAL #3   Title  report pain < 5/10 with activity for improved function and mobility    Status  New    Target Date  09/04/18      PT LONG TERM GOAL #4   Title  improve Rt hip strength to at least 4+/5 for improved function    Status  New    Target Date  09/04/18      PT LONG TERM GOAL #5   Title  demonstrate improved mobility by performing lumbar ROM with pain < 4/10    Status  New    Target Date  09/04/18             Plan - 07/24/18 1330    Clinical Impression Statement  Pt is a 68 y/o female who presents to OPPT for Rt sided LBP with radicular symptoms into Rt glutes and piriformis.  Pt with positive response to DN, manual therapy and modalities today.  Pt demonstrates decreased strength and difficulty with ROM as well as postural abnormalities and trigger points affecting functional mobility.  Pt will benefit from PT to address deficits listed.    History and Personal Factors relevant to plan of care:  headaches, sleep apnea, obesity, HTN, hx breast cancer    Clinical Presentation  Evolving    Clinical Presentation due to:  sudden onset with radicular symptoms    Clinical Decision Making  Moderate    Rehab Potential  Good    PT Frequency  2x / week    PT Duration  6 weeks    PT Treatment/Interventions  ADLs/Self Care Home Management;Canalith Repostioning;Electrical Stimulation;Iontophoresis 4mg /ml Dexamethasone;Cryotherapy;Functional mobility training;Stair training;Gait training;Ultrasound;Moist  Heat;Therapeutic activities;Therapeutic exercise;Traction;Balance training;Neuromuscular re-education;Patient/family education;Manual techniques;Dry needling;Passive range of motion;Taping    PT Next Visit Plan  review HEP, assess response to DN/manual/modalities and continue PRN    PT Home Exercise Plan  Access Code: [email or web address]     Consulted and Agree with Plan of Care  Patient       Patient will benefit from skilled therapeutic intervention in order to improve the following deficits and impairments:  Abnormal gait, Difficulty walking, Increased fascial restricitons, Increased muscle spasms, Pain, Impaired flexibility, Decreased strength, Postural dysfunction, Decreased mobility  Visit Diagnosis: Chronic right-sided low back pain with right-sided sciatica - Plan: PT plan of care cert/re-cert  Muscle weakness (generalized) - Plan: PT plan of care cert/re-cert  Other abnormalities of gait and mobility - Plan:  PT plan of care cert/re-cert     Problem List Patient Active Problem List   Diagnosis Date Noted  . Primary localized osteoarthritis of right knee 05/03/2016  . Primary osteoarthritis of right knee 05/03/2016      Laureen Abrahams, PT, DPT 07/24/18 1:43 PM     [facility] [place], [place], [address] Phone: [phone]   Fax:  [phone]  Name: Robin Christensen MRN: [id number] Date of Birth: June 04, 1951

## 2018-07-26 ENCOUNTER — Ambulatory Visit: Payer: Medicare HMO | Admitting: Physical Therapy

## 2018-07-26 ENCOUNTER — Other Ambulatory Visit: Payer: Self-pay | Admitting: Orthopaedic Surgery

## 2018-07-26 ENCOUNTER — Encounter: Payer: Self-pay | Admitting: Physical Therapy

## 2018-07-26 DIAGNOSIS — G8929 Other chronic pain: Secondary | ICD-10-CM

## 2018-07-26 DIAGNOSIS — R2689 Other abnormalities of gait and mobility: Secondary | ICD-10-CM

## 2018-07-26 DIAGNOSIS — M6281 Muscle weakness (generalized): Secondary | ICD-10-CM

## 2018-07-26 DIAGNOSIS — M5441 Lumbago with sciatica, right side: Secondary | ICD-10-CM | POA: Diagnosis not present

## 2018-07-26 NOTE — Therapy (Signed)
[place], [place], [address] Phone: [phone]   Fax:  [phone]  Physical Therapy Treatment  Patient Details  Name: Robin Christensen MRN: [id number] Date of Birth: 1951-04-12 Referring Provider (PT): Dr. Melrose Nakayama   Encounter Date: 07/26/2018  PT End of Session - 07/26/18 1021    Visit Number  2    Number of Visits  12    Date for PT Re-Evaluation  09/04/18    PT Start Time  0846    PT Stop Time  0943    PT Time Calculation (min)  57 min    Activity Tolerance  Patient tolerated treatment well;Patient limited by pain    Behavior During Therapy  [facility] for tasks assessed/performed       Past Medical History:  Diagnosis Date  . Cancer ([place])   . Dysrhythmia    AF  . Hyperlipidemia   . Hypertension   . Obesity   . PONV (postoperative nausea and vomiting)   . Sleep apnea    cpcap    Past Surgical History:  Procedure Laterality Date  . BREAST SURGERY    . CARDIOVERSION N/A 06/26/2014   Procedure: CARDIOVERSION;  Surgeon: Laverda Page, MD;  Location: [place];  Service: Cardiovascular;  Laterality: N/A;  . CHOLECYSTECTOMY    . 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  Subjective Assessment - 07/26/18 0849    Subjective  doing a little better.  walked 2.5-3 miles Tues/Wed which she hasn't done about 2 months.  had some Rt knee swelling and a little pain with walking following.    Patient Stated Goals  improve pain, wants dry needling; lost exercises    Currently in Pain?  Yes    Pain Score  5    burning 3/10   Pain Location  Buttocks    Pain Orientation  Right    Pain Descriptors / Indicators  Burning    Pain Type  Chronic pain;Acute pain    Pain Onset  1 to 4 weeks ago    Pain Frequency   Constant    Aggravating Factors   forward bending, sit to/from standing    Pain Relieving Factors  dry needling, medication                       [facility] Adult PT Treatment/Exercise - 07/26/18 0853      Exercises   Exercises  Lumbar      Lumbar Exercises: Aerobic   Nustep  L4 x 6 min      Lumbar Exercises: Supine   Other Supine Lumbar Exercises  isometric hip abdct in hooklying 10x5 sec; isometric hip extension 10x5 sec      Moist Heat Therapy   Number Minutes Moist Heat  15 Minutes    Moist Heat Location  Hip   and Rt glutes     Electrical Stimulation   Electrical Stimulation Location  Rt buttock    Electrical Stimulation Action  IFC    Electrical Stimulation Parameters  to tolerance    Electrical Stimulation Goals  Pain;Tone      Manual Therapy   Soft tissue mobilization  IASTM Rt glutes and piriformis  PT Long Term Goals - 07/24/18 1338      PT LONG TERM GOAL #1   Title  independent with HEP    Status  New    Target Date  09/04/18      PT LONG TERM GOAL #2   Title  FOTO score improved to </= 32% limited for improved function    Status  New    Target Date  09/04/18      PT LONG TERM GOAL #3   Title  report pain < 5/10 with activity for improved function and mobility    Status  New    Target Date  09/04/18      PT LONG TERM GOAL #4   Title  improve Rt hip strength to at least 4+/5 for improved function    Status  New    Target Date  09/04/18      PT LONG TERM GOAL #5   Title  demonstrate improved mobility by performing lumbar ROM with pain < 4/10    Status  New    Target Date  09/04/18            Plan - 07/26/18 1021    Clinical Impression Statement  Pt with positive response to DN and manual therapy last session reporting burning decreased to 3/10, but still with elevated pain in Rt buttocks.  Slowly progressing, no goals met as only 2nd visit.    Rehab Potential  Good    PT Frequency  2x / week    PT  Duration  6 weeks    PT Treatment/Interventions  ADLs/Self Care Home Management;Canalith Repostioning;Electrical Stimulation;Iontophoresis 3m/ml Dexamethasone;Cryotherapy;Functional mobility training;Stair training;Gait training;Ultrasound;Moist Heat;Therapeutic activities;Therapeutic exercise;Traction;Balance training;Neuromuscular re-education;Patient/family education;Manual techniques;Dry needling;Passive range of motion;Taping    PT Next Visit Plan  review HEP, assess response to DN/manual/modalities and continue PRN    PT Home Exercise Plan  Access Code: [email or web address]     Consulted and Agree with Plan of Care  Patient       Patient will benefit from skilled therapeutic intervention in order to improve the following deficits and impairments:  Abnormal gait, Difficulty walking, Increased fascial restricitons, Increased muscle spasms, Pain, Impaired flexibility, Decreased strength, Postural dysfunction, Decreased mobility  Visit Diagnosis: Chronic right-sided low back pain with right-sided sciatica  Muscle weakness (generalized)  Other abnormalities of gait and mobility     Problem List Patient Active Problem List   Diagnosis Date Noted  . Primary localized osteoarthritis of right knee 05/03/2016  . Primary osteoarthritis of right knee 05/03/2016      SLaureen Abrahams PT, DPT 07/26/18 10:23 AM    [facility] [address]Phone: [phone]  Fax:  [phone] Name: Robin WINBERRYMRN: [id number] of Birth: [birth year]

## 2018-07-26 NOTE — Patient Instructions (Signed)
Access Code: [email or web address]  URL: [email or web address]  Date: 07/26/2018  Prepared by: Faustino Congress   Exercises  Supine Piriformis Stretch - 3 reps - 1 sets - 30 sec hold - 2x daily - 7x weekly  Standing Glute Med Mobilization with Small Ball on Wall - 2-3 reps - 1 sets - 30-60 sec hold - 2x daily - 7x weekly  Hooklying Isometric Clamshell - 10 reps - 1 sets - 5 sec hold - 2x daily - 7x weekly  Supine 90/90 Single Leg Isometric Hip Extension - 10 reps - 1 sets - 5 sec hold - 1x daily - 7x weekly  Patient Education  Trigger Point Dry Needling

## 2018-07-30 ENCOUNTER — Ambulatory Visit: Payer: Medicare HMO | Admitting: Physical Therapy

## 2018-07-30 DIAGNOSIS — G8929 Other chronic pain: Secondary | ICD-10-CM

## 2018-07-30 DIAGNOSIS — M5441 Lumbago with sciatica, right side: Secondary | ICD-10-CM | POA: Diagnosis not present

## 2018-07-30 DIAGNOSIS — M6281 Muscle weakness (generalized): Secondary | ICD-10-CM | POA: Diagnosis not present

## 2018-07-30 DIAGNOSIS — R2689 Other abnormalities of gait and mobility: Secondary | ICD-10-CM

## 2018-07-30 NOTE — Therapy (Signed)
[place], [place], [address] Phone: [phone]   Fax:  [phone]  Physical Therapy Treatment  Patient Details  Name: Robin Christensen MRN: [id number] Date of Birth: 30-May-1951 Referring Provider (PT): Dr. Melrose Nakayama   Encounter Date: 07/30/2018  PT End of Session - 07/30/18 0846    Visit Number  3    Number of Visits  12    Date for PT Re-Evaluation  09/04/18    PT Start Time  5056    PT Stop Time  0943    PT Time Calculation (min)  56 min       Past Medical History:  Diagnosis Date  . Cancer ([place])   . Dysrhythmia    AF  . Hyperlipidemia   . Hypertension   . Obesity   . PONV (postoperative nausea and vomiting)   . Sleep apnea    cpcap    Past Surgical History:  Procedure Laterality Date  . BREAST SURGERY    . CARDIOVERSION N/A 06/26/2014   Procedure: CARDIOVERSION;  Surgeon: Laverda Page, MD;  Location: [facility];  Service: Cardiovascular;  Laterality: N/A;  . CHOLECYSTECTOMY    . 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  Subjective Assessment - 07/30/18 0851    Subjective  "I didn't sleep well last night".  Pt reports some improvement since initiating therapy, but her buttocks is still bothering her.      Patient Stated Goals  improve pain, wants dry needling; lost exercises    Currently in Pain?  Yes    Pain Score  5     Pain Location  Buttocks    Pain Orientation  Right    Pain Descriptors / Indicators  Aching    Aggravating Factors   forward bending     Pain Relieving Factors  manual therapy/DN, medication         [facility] PT Assessment - 07/30/18 0001      Assessment   Medical Diagnosis  LBP with Rt radiculopathy    Referring Provider (PT)  Dr. Melrose Nakayama    Onset  Date/Surgical Date  --   2-3 weeks   Hand Dominance  Right    Next MD Visit  PRN    Prior Therapy  June 2019: at this clinic       [facility] Adult PT Treatment/Exercise - 07/30/18 0001      Lumbar Exercises: Stretches   Passive Hamstring Stretch  Right;3 reps;30 seconds   supine with strap   Piriformis Stretch  Right;3 reps;20 seconds;Left;2 reps      Lumbar Exercises: Aerobic   Nustep  L4 x 5 min      Lumbar Exercises: Supine   Bridge  5 reps    very limited tolerance     Moist Heat Therapy   Number Minutes Moist Heat  15 Minutes    Moist Heat Location  Hip   and Rt glutes     Electrical Stimulation   Electrical Stimulation Location  Rt buttock    Electrical Stimulation Action  IFC    Electrical Stimulation Parameters  to tolerance    Electrical Stimulation Goals  Pain;Tone      Manual Therapy   Manual Therapy  Soft tissue mobilization;Muscle Energy Technique;Myofascial release    Manual therapy  comments  pt in Lt sidelying     Soft tissue mobilization  STM including cross fiber friction and TPR to Rt biceps femoris, glute med and piriformis (all point tender.)    Myofascial Release  MFR to Rt QL and glute med/max     Muscle Energy Technique  MET to correct ant rotated Rt ilium in supine utilizing contract relax of Rt hamstring.           PT Long Term Goals - 07/24/18 1338      PT LONG TERM GOAL #1   Title  independent with HEP    Status  New    Target Date  09/04/18      PT LONG TERM GOAL #2   Title  FOTO score improved to </= 32% limited for improved function    Status  New    Target Date  09/04/18      PT LONG TERM GOAL #3   Title  report pain < 5/10 with activity for improved function and mobility    Status  New    Target Date  09/04/18      PT LONG TERM GOAL #4   Title  improve Rt hip strength to at least 4+/5 for improved function    Status  New    Target Date  09/04/18      PT LONG TERM GOAL #5   Title  demonstrate improved mobility by performing  lumbar ROM with pain < 4/10    Status  New    Target Date  09/04/18            Plan - 07/30/18 0931    Clinical Impression Statement  Pt had many tender trigger points along Rt biceps femoris; reduced with TPR / STM to area.  Pt reported relief of symptoms during manual therapy and estim/MHP, however pt demonstrated decreased tolerance for bed mobility and sit to stand upon finishing session.  No new goals met this session.      Rehab Potential  Good    PT Frequency  2x / week    PT Duration  6 weeks    PT Treatment/Interventions  ADLs/Self Care Home Management;Canalith Repostioning;Electrical Stimulation;Iontophoresis 39m/ml Dexamethasone;Cryotherapy;Functional mobility training;Stair training;Gait training;Ultrasound;Moist Heat;Therapeutic activities;Therapeutic exercise;Traction;Balance training;Neuromuscular re-education;Patient/family education;Manual techniques;Dry needling;Passive range of motion;Taping    PT Next Visit Plan  DN/manual therapy, to include Rt hamstring.     PT Home Exercise Plan  Access Code: [email or web address]    Consulted and Agree with Plan of Care  Patient       Patient will benefit from skilled therapeutic intervention in order to improve the following deficits and impairments:  Abnormal gait, Difficulty walking, Increased fascial restricitons, Increased muscle spasms, Pain, Impaired flexibility, Decreased strength, Postural dysfunction, Decreased mobility  Visit Diagnosis: Chronic right-sided low back pain with right-sided sciatica  Muscle weakness (generalized)  Other abnormalities of gait and mobility     Problem List Patient Active Problem List   Diagnosis Date Noted  . Primary localized osteoarthritis of right knee 05/03/2016  . Primary osteoarthritis of right knee 05/03/2016   JKerin Perna PTA 07/30/18 2:20 PM  [address] [address]Phone:  [phone]  Fax:  [phone] Name: Robin PELOSOMRN: [id number] of Birth: [birth year]

## 2018-08-01 ENCOUNTER — Encounter: Payer: Self-pay | Admitting: Physical Therapy

## 2018-08-01 ENCOUNTER — Ambulatory Visit: Payer: Medicare HMO | Admitting: Physical Therapy

## 2018-08-01 DIAGNOSIS — M5441 Lumbago with sciatica, right side: Secondary | ICD-10-CM

## 2018-08-01 DIAGNOSIS — M6281 Muscle weakness (generalized): Secondary | ICD-10-CM | POA: Diagnosis not present

## 2018-08-01 DIAGNOSIS — R2689 Other abnormalities of gait and mobility: Secondary | ICD-10-CM | POA: Diagnosis not present

## 2018-08-01 DIAGNOSIS — G8929 Other chronic pain: Secondary | ICD-10-CM

## 2018-08-01 NOTE — Therapy (Signed)
[place], [place], [address] Phone: [phone]   Fax:  [phone]  Physical Therapy Treatment  Patient Details  Name: Robin Christensen MRN: [id number] Date of Birth: Jun 05, 1951 Referring Provider (PT): Dr. Melrose Nakayama   Encounter Date: 08/01/2018  PT End of Session - 08/01/18 1524    Visit Number  4    Number of Visits  12    Date for PT Re-Evaluation  09/04/18    PT Start Time  2979    PT Stop Time  1446    PT Time Calculation (min)  50 min    Activity Tolerance  Patient tolerated treatment well;Patient limited by pain    Behavior During Therapy  [facility] for tasks assessed/performed       Past Medical History:  Diagnosis Date  . Cancer ([place])   . Dysrhythmia    AF  . Hyperlipidemia   . Hypertension   . Obesity   . PONV (postoperative nausea and vomiting)   . Sleep apnea    cpcap    Past Surgical History:  Procedure Laterality Date  . BREAST SURGERY    . CARDIOVERSION N/A 06/26/2014   Procedure: CARDIOVERSION;  Surgeon: Laverda Page, MD;  Location: [place];  Service: Cardiovascular;  Laterality: N/A;  . CHOLECYSTECTOMY    . 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  Subjective Assessment - 08/01/18 1357    Subjective  hasn't done exercises or walked since last visit "I was resting it."  took extra strength tylenol which is helping    Pertinent History  sch for Lt TKA 08/14/18    How long can you sit comfortably?  pain with transition to/from stand    Patient Stated Goals  improve pain, wants dry needling; lost exercises    Currently in Pain?  Yes    Pain Score  5     Pain Location  Buttocks    Pain Orientation  Right    Pain Descriptors / Indicators  Aching    Pain Type  Chronic pain;Acute pain     Pain Onset  1 to 4 weeks ago    Pain Frequency  Constant    Aggravating Factors   forward bending    Pain Relieving Factors  manual therapy/DN, medication                       [facility] Adult PT Treatment/Exercise - 08/01/18 1359      Lumbar Exercises: Aerobic   Nustep  L4 x 5 min      Moist Heat Therapy   Number Minutes Moist Heat  15 Minutes    Moist Heat Location  Hip   and Rt glutes     Electrical Stimulation   Electrical Stimulation Location  Rt buttock    Electrical Stimulation Action  IFC    Electrical Stimulation Parameters  to tolerance    Electrical Stimulation Goals  Pain;Tone      Manual Therapy   Manual Therapy  Soft tissue mobilization    Soft tissue mobilization  STM including cross fiber friction and TPR to Rt biceps femoris, glute med and piriformis (all point tender.)       Trigger Point Dry Needling - 08/01/18 1431    Consent Given?  Yes  Education Handout Provided  Yes    Muscles Treated Lower Body  Gluteus maximus;Piriformis;Gluteus minimus    Gluteus Maximus Response  Twitch response elicited;Palpable increased muscle length    Gluteus Minimus Response  Twitch response elicited;Palpable increased muscle length   and glute med   Piriformis Response  Twitch response elicited;Palpable increased muscle length                PT Long Term Goals - 07/24/18 1338      PT LONG TERM GOAL #1   Title  independent with HEP    Status  New    Target Date  09/04/18      PT LONG TERM GOAL #2   Title  FOTO score improved to </= 32% limited for improved function    Status  New    Target Date  09/04/18      PT LONG TERM GOAL #3   Title  report pain < 5/10 with activity for improved function and mobility    Status  New    Target Date  09/04/18      PT LONG TERM GOAL #4   Title  improve Rt hip strength to at least 4+/5 for improved function    Status  New    Target Date  09/04/18      PT LONG TERM GOAL #5   Title  demonstrate  improved mobility by performing lumbar ROM with pain < 4/10    Status  New    Target Date  09/04/18            Plan - 08/01/18 1525    Clinical Impression Statement  Pt reporting decreased pain following DN and manual therapy today.  Continues to have elevated pain, but reporting overall improved functional mobility and decreased pain since iniital eval.  Goals ongoing at this time.    Rehab Potential  Good    PT Frequency  2x / week    PT Duration  6 weeks    PT Treatment/Interventions  ADLs/Self Care Home Management;Canalith Repostioning;Electrical Stimulation;Iontophoresis 4mg /ml Dexamethasone;Cryotherapy;Functional mobility training;Stair training;Gait training;Ultrasound;Moist Heat;Therapeutic activities;Therapeutic exercise;Traction;Balance training;Neuromuscular re-education;Patient/family education;Manual techniques;Dry needling;Passive range of motion;Taping    PT Next Visit Plan  DN/manual therapy, continue SI stabilization/muscle energy    PT Home Exercise Plan  Access Code: [email or web address]     Consulted and Agree with Plan of Care  Patient       Patient will benefit from skilled therapeutic intervention in order to improve the following deficits and impairments:  Abnormal gait, Difficulty walking, Increased fascial restricitons, Increased muscle spasms, Pain, Impaired flexibility, Decreased strength, Postural dysfunction, Decreased mobility  Visit Diagnosis: Chronic right-sided low back pain with right-sided sciatica  Muscle weakness (generalized)  Other abnormalities of gait and mobility     Problem List Patient Active Problem List   Diagnosis Date Noted  . Primary localized osteoarthritis of right knee 05/03/2016  . Primary osteoarthritis of right knee 05/03/2016      Laureen Abrahams, PT, DPT 08/01/18 3:26 PM     [place], [place], [address] Phone: [phone]   Fax:   [phone]  Name: Robin Christensen MRN: [id number] Date of Birth: 05/04/1951

## 2018-08-03 ENCOUNTER — Encounter: Payer: Medicare HMO | Admitting: Physical Therapy

## 2018-08-06 NOTE — Pre-Procedure Instructions (Signed)
Robin Christensen  08/06/2018      [place] [place] [address] Phone: [phone] Fax: [phone]    Your procedure is scheduled on January 28th.  Report to [facility] Admitting at 8:00 A.M.  Call this number if you have problems the morning of surgery:  [phone]   Remember:  Do not eat or drink after midnight.     Take these medicines the morning of surgery with A SIP OF WATER   Tylenol - if needed  Levothyroxine (Synthroid)  Follow your surgeon's instructions on when to stop Eliquis.  If no instructions were given by your surgeon then you will need to call the office to get those instructions.    7 days prior to surgery STOP taking any Aspirin (unless otherwise instructed by your surgeon), Aleve, Naproxen, Ibuprofen, Motrin, Advil, Goody's, BC's, all herbal medications, fish oil, and all vitamins.     Do not wear jewelry, make-up or nail polish.  Do not wear lotions, powders, or perfumes, or deodorant.  Do not shave 48 hours prior to surgery.    Do not bring valuables to the hospital.  [facility] is not responsible for any belongings or valuables.   [facility]- Preparing For Surgery  Before surgery, you can play an important role. Because skin is not sterile, your skin needs to be as free of germs as possible. You can reduce the number of germs on your skin by washing with CHG (chlorahexidine gluconate) Soap before surgery.  CHG is an antiseptic cleaner which kills germs and bonds with the skin to continue killing germs even after washing.    Oral Hygiene is also important to reduce your risk of infection.  Remember - BRUSH YOUR TEETH THE MORNING OF SURGERY WITH YOUR REGULAR TOOTHPASTE  Please do not use if you have an allergy to CHG or antibacterial soaps. If your skin becomes reddened/irritated stop using the CHG.  Do not shave (including legs and underarms) for at  least 48 hours prior to first CHG shower. It is OK to shave your face.  Please follow these instructions carefully.   1. Shower the NIGHT BEFORE SURGERY and the MORNING OF SURGERY with CHG.   2. If you chose to wash your hair, wash your hair first as usual with your normal shampoo.  3. After you shampoo, rinse your hair and body thoroughly to remove the shampoo.  4. Use CHG as you would any other liquid soap. You can apply CHG directly to the skin and wash gently with a scrungie or a clean washcloth.   5. Apply the CHG Soap to your body ONLY FROM THE NECK DOWN.  Do not use on open wounds or open sores. Avoid contact with your eyes, ears, mouth and genitals (private parts). Wash Face and genitals (private parts)  with your normal soap.  6. Wash thoroughly, paying special attention to the area where your surgery will be performed.  7. Thoroughly rinse your body with warm water from the neck down.  8. DO NOT shower/wash with your normal soap after using and rinsing off the CHG Soap.  9. Pat yourself dry with a CLEAN TOWEL.  10. Wear CLEAN PAJAMAS to bed the night before surgery, wear comfortable clothes the morning of surgery  11. Place CLEAN SHEETS on your bed the night of your first shower and DO NOT SLEEP WITH PETS.   Day of  Surgery:  Do not apply any deodorants/lotions.  Please wear clean clothes to the hospital/surgery center.   Remember to brush your teeth WITH YOUR REGULAR TOOTHPASTE.   Contacts, dentures or bridgework may not be worn into surgery.  Leave your suitcase in the car.  After surgery it may be brought to your room.  For patients admitted to the hospital, discharge time will be determined by your treatment team.  Patients discharged the day of surgery will not be allowed to drive home.   Please read over the following fact sheets that you were given. Coughing and Deep Breathing, MRSA Information and Surgical Site Infection Prevention

## 2018-08-07 ENCOUNTER — Ambulatory Visit: Payer: Medicare HMO | Admitting: Physical Therapy

## 2018-08-07 ENCOUNTER — Encounter: Payer: Self-pay | Admitting: Physical Therapy

## 2018-08-07 ENCOUNTER — Other Ambulatory Visit: Payer: Self-pay

## 2018-08-07 ENCOUNTER — Ambulatory Visit (HOSPITAL_COMMUNITY)
Admission: RE | Admit: 2018-08-07 | Discharge: 2018-08-07 | Disposition: A | Discharge status: Home / Self Care | Payer: Medicare HMO | Source: Ambulatory Visit | Attending: Orthopaedic Surgery | Admitting: Orthopaedic Surgery

## 2018-08-07 ENCOUNTER — Encounter (HOSPITAL_COMMUNITY)
Admission: RE | Admit: 2018-08-07 | Discharge: 2018-08-07 | Disposition: A | Discharge status: Home / Self Care | Payer: Medicare HMO | Source: Ambulatory Visit | Attending: Orthopaedic Surgery | Admitting: Orthopaedic Surgery

## 2018-08-07 ENCOUNTER — Encounter (HOSPITAL_COMMUNITY): Payer: Self-pay

## 2018-08-07 DIAGNOSIS — M1712 Unilateral primary osteoarthritis, left knee: Secondary | ICD-10-CM | POA: Insufficient documentation

## 2018-08-07 DIAGNOSIS — Z853 Personal history of malignant neoplasm of breast: Secondary | ICD-10-CM | POA: Insufficient documentation

## 2018-08-07 DIAGNOSIS — R2689 Other abnormalities of gait and mobility: Secondary | ICD-10-CM

## 2018-08-07 DIAGNOSIS — G8929 Other chronic pain: Secondary | ICD-10-CM

## 2018-08-07 DIAGNOSIS — Z79890 Hormone replacement therapy (postmenopausal): Secondary | ICD-10-CM | POA: Insufficient documentation

## 2018-08-07 DIAGNOSIS — E039 Hypothyroidism, unspecified: Secondary | ICD-10-CM | POA: Diagnosis not present

## 2018-08-07 DIAGNOSIS — I10 Essential (primary) hypertension: Secondary | ICD-10-CM | POA: Insufficient documentation

## 2018-08-07 DIAGNOSIS — Z01818 Encounter for other preprocedural examination: Secondary | ICD-10-CM

## 2018-08-07 DIAGNOSIS — Z79899 Other long term (current) drug therapy: Secondary | ICD-10-CM | POA: Diagnosis not present

## 2018-08-07 DIAGNOSIS — M5441 Lumbago with sciatica, right side: Secondary | ICD-10-CM

## 2018-08-07 DIAGNOSIS — Z7901 Long term (current) use of anticoagulants: Secondary | ICD-10-CM | POA: Diagnosis not present

## 2018-08-07 DIAGNOSIS — I480 Paroxysmal atrial fibrillation: Secondary | ICD-10-CM | POA: Diagnosis not present

## 2018-08-07 DIAGNOSIS — G4733 Obstructive sleep apnea (adult) (pediatric): Secondary | ICD-10-CM | POA: Diagnosis not present

## 2018-08-07 DIAGNOSIS — M6281 Muscle weakness (generalized): Secondary | ICD-10-CM | POA: Diagnosis not present

## 2018-08-07 DIAGNOSIS — Z01811 Encounter for preprocedural respiratory examination: Secondary | ICD-10-CM | POA: Diagnosis not present

## 2018-08-07 HISTORY — DX: Hypothyroidism, unspecified: E03.9

## 2018-08-07 HISTORY — DX: Pneumonia, unspecified organism: J18.9

## 2018-08-07 LAB — URINALYSIS, ROUTINE W REFLEX MICROSCOPIC
BILIRUBIN URINE: NEGATIVE
Glucose, UA: NEGATIVE mg/dL
Hgb urine dipstick: NEGATIVE
KETONES UR: NEGATIVE mg/dL
Leukocytes, UA: NEGATIVE
Nitrite: NEGATIVE
Protein, ur: NEGATIVE mg/dL
Specific Gravity, Urine: 1.005 (ref 1.005–1.030)
pH: 7 (ref 5.0–8.0)

## 2018-08-07 LAB — BASIC METABOLIC PANEL
Anion gap: 9 (ref 5–15)
BUN: 16 mg/dL (ref 8–23)
CO2: 27 mmol/L (ref 22–32)
Calcium: 9.6 mg/dL (ref 8.9–10.3)
Chloride: 103 mmol/L (ref 98–111)
Creatinine, Ser: 0.78 mg/dL (ref 0.44–1.00)
GFR calc Af Amer: >60 mL/min (ref >60)
GFR calc non Af Amer: >60 mL/min (ref >60)
Glucose, Bld: 101 mg/dL — ABNORMAL HIGH (ref 70–99)
Potassium: 3.6 mmol/L (ref 3.5–5.1)
Sodium: 139 mmol/L (ref 135–145)

## 2018-08-07 LAB — PROTIME-INR
INR: 1.1
Prothrombin Time: 14.1 seconds (ref 11.4–15.2)

## 2018-08-07 LAB — SURGICAL PCR SCREEN
MRSA, PCR: NEGATIVE
Staphylococcus aureus: NEGATIVE

## 2018-08-07 LAB — TYPE AND SCREEN
ABO/RH(D): O POS
Antibody Screen: NEGATIVE

## 2018-08-07 LAB — CBC WITH DIFFERENTIAL/PLATELET
Abs Immature Granulocytes: 0.01 10*3/uL (ref 0.00–0.07)
Basophils Absolute: 0 10*3/uL (ref 0.0–0.1)
Basophils Relative: 1 %
Eosinophils Absolute: 0.1 10*3/uL (ref 0.0–0.5)
Eosinophils Relative: 2 %
HEMATOCRIT: 47.2 % — AB (ref 36.0–46.0)
Hemoglobin: 14.8 g/dL (ref 12.0–15.0)
Immature Granulocytes: 0 %
Lymphocytes Relative: 33 %
Lymphs Abs: 2.1 10*3/uL (ref 0.7–4.0)
MCH: 29.2 pg (ref 26.0–34.0)
MCHC: 31.4 g/dL (ref 30.0–36.0)
MCV: 93.1 fL (ref 80.0–100.0)
MONO ABS: 0.4 10*3/uL (ref 0.1–1.0)
Monocytes Relative: 7 %
Neutro Abs: 3.6 10*3/uL (ref 1.7–7.7)
Neutrophils Relative %: 57 %
Platelets: 240 10*3/uL (ref 150–400)
RBC: 5.07 MIL/uL (ref 3.87–5.11)
RDW: 13.2 % (ref 11.5–15.5)
WBC: 6.3 10*3/uL (ref 4.0–10.5)
nRBC: 0 % (ref 0.0–0.2)

## 2018-08-07 LAB — APTT: aPTT: 46 seconds — ABNORMAL HIGH (ref 24–36)

## 2018-08-07 NOTE — Progress Notes (Signed)
PCP: London Pepper  Cardiologist: Einar Gip  DM: denies  SA: yes, wears CPAP  Pt denies SOB, cough, fever, chest pain @ PAT appt  Pt stated understanding of instructions given for DOS.  Pt instructed to follow up with Dr. Rhona Raider in regards to stopping Eliquis.

## 2018-08-07 NOTE — Therapy (Signed)
[place], [place], [address] Phone: [phone]   Fax:  [phone]  Physical Therapy Treatment  Patient Details  Name: Robin Christensen MRN: [id number] Date of Birth: Feb 07, 1951 Referring Provider (PT): Dr. Melrose Nakayama   Encounter Date: 08/07/2018  PT End of Session - 08/07/18 0908    Visit Number  5    Number of Visits  12    Date for PT Re-Evaluation  09/04/18    PT Start Time  0835    PT Stop Time  0905   pt needed to leave early for another appt   PT Time Calculation (min)  30 min    Activity Tolerance  Patient tolerated treatment well;Patient limited by pain    Behavior During Therapy  [facility] for tasks assessed/performed       Past Medical History:  Diagnosis Date  . Cancer ([place])   . Dysrhythmia    AF  . Hyperlipidemia   . Hypertension   . Obesity   . PONV (postoperative nausea and vomiting)   . Sleep apnea    cpcap    Past Surgical History:  Procedure Laterality Date  . BREAST SURGERY    . CARDIOVERSION N/A 06/26/2014   Procedure: CARDIOVERSION;  Surgeon: Laverda Page, MD;  Location: [place];  Service: Cardiovascular;  Laterality: N/A;  . CHOLECYSTECTOMY    . TOTAL KNEE ARTHROPLASTY Right 05/03/2016   Procedure: TOTAL KNEE ARTHROPLASTY;  Surgeon: Melrose Nakayama, MD;  Location: [place];  Service: Orthopedics;  Laterality: Right;    There were no vitals filed for this visit.  Subjective Assessment - 08/07/18 0844    Subjective  feeling much better; pain is much deeper now    Pertinent History  sch for Lt TKA 08/14/18    How long can you sit comfortably?  pain with transition to/from stand    Patient Stated Goals  improve pain, wants dry needling; lost exercises    Currently in Pain?  Yes    Pain Score  3     Pain Location  Buttocks    Pain Orientation  Right    Pain Descriptors / Indicators  Aching    Pain Type  Chronic pain;Acute pain    Pain Onset  1 to 4  weeks ago    Pain Frequency  Constant    Aggravating Factors   forward bending    Pain Relieving Factors  manual therapy/DN, medication                       [facility] Adult PT Treatment/Exercise - 08/07/18 0904      Lumbar Exercises: Stretches   Piriformis Stretch  Right;3 reps;30 seconds      Lumbar Exercises: Aerobic   Nustep  L4 x 5 min      Manual Therapy   Manual Therapy  Soft tissue mobilization    Manual therapy comments  pt in Lt sidelying     Soft tissue mobilization  STM including cross fiber friction and TPR to Rt biceps femoris, glute med and piriformis (all point tender.)       Trigger Point Dry Needling - 08/07/18 5035    Consent Given?  Yes    Muscles Treated Lower Body  Gluteus maximus;Gluteus minimus;Piriformis   and glute med   Gluteus Maximus Response  Twitch response elicited;Palpable increased muscle length    Gluteus Minimus Response  Twitch response elicited;Palpable increased muscle length  Piriformis Response  Twitch response elicited;Palpable increased muscle length                PT Long Term Goals - 07/24/18 1338      PT LONG TERM GOAL #1   Title  independent with HEP    Status  New    Target Date  09/04/18      PT LONG TERM GOAL #2   Title  FOTO score improved to </= 32% limited for improved function    Status  New    Target Date  09/04/18      PT LONG TERM GOAL #3   Title  report pain < 5/10 with activity for improved function and mobility    Status  New    Target Date  09/04/18      PT LONG TERM GOAL #4   Title  improve Rt hip strength to at least 4+/5 for improved function    Status  New    Target Date  09/04/18      PT LONG TERM GOAL #5   Title  demonstrate improved mobility by performing lumbar ROM with pain < 4/10    Status  New    Target Date  09/04/18            Plan - 08/07/18 0909    Clinical Impression Statement  Pt with decreased pain overall and decreased pain following DN and manual  therapy today.  Plans for TKA next week, so will need to d/c from PT next visit.    Rehab Potential  Good    PT Frequency  2x / week    PT Duration  6 weeks    PT Treatment/Interventions  ADLs/Self Care Home Management;Canalith Repostioning;Electrical Stimulation;Iontophoresis 4mg /ml Dexamethasone;Cryotherapy;Functional mobility training;Stair training;Gait training;Ultrasound;Moist Heat;Therapeutic activities;Therapeutic exercise;Traction;Balance training;Neuromuscular re-education;Patient/family education;Manual techniques;Dry needling;Passive range of motion;Taping    PT Next Visit Plan  check goals, d/c and then manual/DN PRN prior to surgery    PT Home Exercise Plan  Access Code: [email or web address]     Consulted and Agree with Plan of Care  Patient       Patient will benefit from skilled therapeutic intervention in order to improve the following deficits and impairments:  Abnormal gait, Difficulty walking, Increased fascial restricitons, Increased muscle spasms, Pain, Impaired flexibility, Decreased strength, Postural dysfunction, Decreased mobility  Visit Diagnosis: Chronic right-sided low back pain with right-sided sciatica  Muscle weakness (generalized)  Other abnormalities of gait and mobility     Problem List Patient Active Problem List   Diagnosis Date Noted  . Primary localized osteoarthritis of right knee 05/03/2016  . Primary osteoarthritis of right knee 05/03/2016      Laureen Abrahams, PT, DPT 08/07/18 9:14 AM     [facility] [place], [place], [address] Phone: [phone]   Fax:  [phone]  Name: Robin Christensen MRN: [id number] Date of Birth: 1950/10/27

## 2018-08-09 NOTE — Progress Notes (Signed)
Anesthesia Chart Review:  Case:  [id number] Date/Time:  08/14/18 0944   Procedure:  TOTAL KNEE ARTHROPLASTY (Left )   Anesthesia type:  Spinal   Pre-op diagnosis:  LEFT KNEE DEGENERATIVE JOINT DISEASE   Location:  [place] OR ROOM 07 / [place] OR   Surgeon:  Melrose Nakayama, MD      DISCUSSION: 68 yo female never smoker. Pertinent hx includes PONV, HTN, OSA on CPAP, Breast CA, Paroxysmal Afib, Hypothyroid, HA.  Prolonged aPTT of 46s noted on preop labs. Review of preivous labs shows it was also prolonged at 51s on preop labs prior to R TKA 2017. Recheck on DOS was better at 38s and spinal anesthesia was deemed to be safe. Pt does take Eliquis for afib which wouldn't be expected to prolong aPTT at therapeutic levels. Per surgeon's office the pt has been cleared by Dr. Einar Gip to hold Eliquis 3d prior to surgery. Lab results called to Dr. Rhona Raider and made aware that pending results of repeat aPTT case may need to be done under GA.  Pt follows with Dr. Einar Gip for hx of afib. Per OV note 06/04/18 pt stable from CV standpoint, recommended 71mo f/u.  Discussed with Dr. Fransisco Beau. Will recheck aPTT on DOS. Decision to proceed with spinal anesthesia will be made by assigned anesthesiologist on DOS based on results.   VS: BP 122/74   Pulse (!) 55   Temp 36.8 C   Resp 20   Ht 5\' 8"  (1.727 m)   Wt 98.2 kg   SpO2 98%   BMI 32.93 kg/m   PROVIDERS: London Pepper, MD is PCP  Adrian Prows, MD is Cardiologist  LABS: Labs reviewed: Acceptable for surgery. See above discussion of prolonged aPTT (all labs ordered are listed, but only abnormal results are displayed)  Labs Reviewed  APTT - Abnormal; Notable for the following components:      Result Value   aPTT 46 (*)    All other components within normal limits  BASIC METABOLIC PANEL - Abnormal; Notable for the following components:   Glucose, Bld 101 (*)    All other components within normal limits  CBC WITH DIFFERENTIAL/PLATELET - Abnormal; Notable for the  following components:   HCT 47.2 (*)    All other components within normal limits  URINALYSIS, ROUTINE W REFLEX MICROSCOPIC - Abnormal; Notable for the following components:   Color, Urine COLORLESS (*)    All other components within normal limits  SURGICAL PCR SCREEN  PROTIME-INR  TYPE AND SCREEN     IMAGES: CHEST - 2 VIEW 08/07/2018  COMPARISON:  04/22/2016.  FINDINGS: Mediastinum hilar structures normal. Lungs are clear. No pleural effusion or pneumothorax. Heart size normal. Surgical clips noted over the right chest. Degenerative change thoracic spine  IMPRESSION: No acute cardiopulmonary disease.  EKG: 08/07/2018: Sinus bradycardia. Rate 53. Nonspecific ST and T wave abnormality  CV: Echo 06/02/11 (Per Dr. Einar Gip notes): Normal LVEF. Mild LVH. No significant valvular abnormalities.   Lexiscan stress 05/27/11 (Per Dr. Einar Gip notes): Normal perfusion. Normal LVEF. No ischemia.  Past Medical History:  Diagnosis Date  . Cancer [facility])    Right Breast Cancer  . Dysrhythmia    AF  . Hyperlipidemia   . Hypertension   . Hypothyroidism   . Obesity   . Pneumonia   . PONV (postoperative nausea and vomiting)   . Sleep apnea    cpcap    Past Surgical History:  Procedure Laterality Date  . BREAST SURGERY    . CARDIOVERSION  N/A 06/26/2014   Procedure: CARDIOVERSION;  Surgeon: Laverda Page, MD;  Location: [place];  Service: Cardiovascular;  Laterality: N/A;  . CHOLECYSTECTOMY    . TOTAL KNEE ARTHROPLASTY Right 05/03/2016   Procedure: TOTAL KNEE ARTHROPLASTY;  Surgeon: Melrose Nakayama, MD;  Location: [place];  Service: Orthopedics;  Laterality: Right;    MEDICATIONS: . acetaminophen (TYLENOL) 500 MG tablet  . CALCIUM-VITAMIN D PO  . ELIQUIS 5 MG TABS tablet  . furosemide (LASIX) 40 MG tablet  . levothyroxine (SYNTHROID, LEVOTHROID) 75 MCG tablet  . potassium chloride SA (K-DUR,KLOR-CON) 20 MEQ tablet  . pravastatin (PRAVACHOL) 40 MG tablet  . zonisamide  (ZONEGRAN) 50 MG capsule   No current facility-administered medications for this encounter.    Wynonia Musty [facility] Short Stay Center/Anesthesiology Phone [phone] 08/10/2018 9:47 AM

## 2018-08-09 NOTE — H&P (Signed)
TOTAL KNEE ADMISSION H&P  Patient is being admitted for left total knee arthroplasty.  Subjective:  Chief Complaint:left knee pain.  HPI: Robin Christensen, 68 y.o. female, has a history of pain and functional disability in the left knee due to arthritis and has failed non-surgical conservative treatments for greater than 12 weeks to includeNSAID's and/or analgesics, corticosteriod injections, viscosupplementation injections, flexibility and strengthening excercises, supervised PT with diminished ADL's post treatment, use of assistive devices, weight reduction as appropriate and activity modification.  Onset of symptoms was gradual, starting 5 years ago with gradually worsening course since that time. The patient noted no past surgery on the left knee(s).  Patient currently rates pain in the left knee(s) at 10 out of 10 with activity. Patient has night pain, worsening of pain with activity and weight bearing, pain that interferes with activities of daily living, crepitus and joint swelling.  Patient has evidence of subchondral cysts, subchondral sclerosis, periarticular osteophytes and joint space narrowing by imaging studies. There is no active infection.  Patient Active Problem List   Diagnosis Date Noted  . Primary localized osteoarthritis of right knee 05/03/2016  . Primary osteoarthritis of right knee 05/03/2016   Past Medical History:  Diagnosis Date  . Cancer [facility])    Right Breast Cancer  . Dysrhythmia    AF  . Hyperlipidemia   . Hypertension   . Hypothyroidism   . Obesity   . Pneumonia   . PONV (postoperative nausea and vomiting)   . Sleep apnea    cpcap    Past Surgical History:  Procedure Laterality Date  . BREAST SURGERY    . CARDIOVERSION N/A 06/26/2014   Procedure: CARDIOVERSION;  Surgeon: Laverda Page, MD;  Location: [place];  Service: Cardiovascular;  Laterality: N/A;  . CHOLECYSTECTOMY    . TOTAL KNEE ARTHROPLASTY Right 05/03/2016   Procedure: TOTAL  KNEE ARTHROPLASTY;  Surgeon: Melrose Nakayama, MD;  Location: [place];  Service: Orthopedics;  Laterality: Right;    No current facility-administered medications for this encounter.    Current Outpatient Medications  Medication Sig Dispense Refill Last Dose  . acetaminophen (TYLENOL) 500 MG tablet Take 500 mg by mouth 3 (three) times daily as needed for mild pain or headache.    Taking  . CALCIUM-VITAMIN D PO Take 2 tablets by mouth every evening.     Marland Kitchen ELIQUIS 5 MG TABS tablet Take 1 tablet (5 mg total) by mouth 2 (two) times daily. 60 tablet 0 Taking  . furosemide (LASIX) 40 MG tablet Take 40 mg by mouth daily.   Taking  . levothyroxine (SYNTHROID, LEVOTHROID) 75 MCG tablet Take 75 mcg by mouth daily before breakfast.   Taking  . potassium chloride SA (K-DUR,KLOR-CON) 20 MEQ tablet Take 20 mEq by mouth daily.    Taking  . pravastatin (PRAVACHOL) 40 MG tablet Take 40 mg by mouth daily.    Taking  . zonisamide (ZONEGRAN) 50 MG capsule Take 1 capsule (50 mg total) by mouth at bedtime. 30 capsule 3 Taking   Allergies  Allergen Reactions  . Xarelto [Rivaroxaban] Other (See Comments)    JOINT ACHES/PAIN    Social History   Tobacco Use  . Smoking status: Never Smoker  . Smokeless tobacco: Never Used  Substance Use Topics  . Alcohol use: Yes    Comment: once a year, maybe    No family history on file.   Review of Systems  Musculoskeletal: Positive for joint pain.       Left  knee  All other systems reviewed and are negative.   Objective:  Physical Exam  Constitutional: She is oriented to person, place, and time. She appears well-developed and well-nourished.  HENT:  Head: Normocephalic and atraumatic.  Eyes: Pupils are equal, round, and reactive to light.  Neck: Normal range of motion.  Cardiovascular: Normal rate and regular rhythm.  Respiratory: Effort normal.  GI: Soft.  Musculoskeletal:     Comments: Both knees move about 0-110.  She has some medial joint line pain on the  left with crepitation but no effusion.  Hip motion is full and straight leg raise is negative.  Opposite knee has the well-healed scar with good motion and good stability.   Neurological: She is alert and oriented to person, place, and time.  Skin: Skin is warm and dry.    Vital signs in last 24 hours:    Labs:   Estimated body mass index is 32.93 kg/m as calculated from the following:   Height as of 08/07/18: 5\' 8"  (1.727 m).   Weight as of 08/07/18: 98.2 kg.   Imaging Review Plain radiographs demonstrate severe degenerative joint disease of the left knee(s). The overall alignment isneutral. The bone quality appears to be good for age and reported activity level.   Preoperative templating of the joint replacement has been completed, documented, and submitted to the Operating Room personnel in order to optimize intra-operative equipment management.    Patient's anticipated LOS is less than 2 midnights, meeting these requirements: - Younger than 60 - Lives within 1 hour of care - Has a competent adult at home to recover with post-op recover - NO history of  - Chronic pain requiring opiods  - Diabetes  - Coronary Artery Disease  - Heart failure  - Heart attack  - Stroke  - DVT/VTE  - Cardiac arrhythmia  - Respiratory Failure/COPD  - Renal failure  - Anemia  - Advanced Liver disease        Assessment/Plan:  End stage primary arthritis, left knee   The patient history, physical examination, clinical judgment of the provider and imaging studies are consistent with end stage degenerative joint disease of the left knee(s) and total knee arthroplasty is deemed medically necessary. The treatment options including medical management, injection therapy arthroscopy and arthroplasty were discussed at length. The risks and benefits of total knee arthroplasty were presented and reviewed. The risks due to aseptic loosening, infection, stiffness, patella tracking problems,  thromboembolic complications and other imponderables were discussed. The patient acknowledged the explanation, agreed to proceed with the plan and consent was signed. Patient is being admitted for inpatient treatment for surgery, pain control, PT, OT, prophylactic antibiotics, VTE prophylaxis, progressive ambulation and ADL's and discharge planning. The patient is planning to be discharged home with home health services

## 2018-08-10 ENCOUNTER — Ambulatory Visit: Payer: Medicare HMO | Admitting: Physical Therapy

## 2018-08-10 DIAGNOSIS — M5441 Lumbago with sciatica, right side: Secondary | ICD-10-CM

## 2018-08-10 DIAGNOSIS — G8929 Other chronic pain: Secondary | ICD-10-CM | POA: Diagnosis not present

## 2018-08-10 DIAGNOSIS — M6281 Muscle weakness (generalized): Secondary | ICD-10-CM

## 2018-08-10 DIAGNOSIS — R2689 Other abnormalities of gait and mobility: Secondary | ICD-10-CM

## 2018-08-10 NOTE — Therapy (Addendum)
[place], [place], [address] Phone: [phone]   Fax:  [phone]  Physical Therapy Treatment/Discharge  Patient Details  Name: Robin Christensen MRN: [id number] Date of Birth: Jul 08, 1951 Referring Provider (PT): Dr. Melrose Nakayama   Encounter Date: 08/10/2018  PT End of Session - 08/10/18 0846    Visit Number  6    Number of Visits  12    Date for PT Re-Evaluation  09/04/18    PT Start Time  0845    PT Stop Time  0942    PT Time Calculation (min)  57 min    Activity Tolerance  Patient tolerated treatment well    Behavior During Therapy  [facility] for tasks assessed/performed       Past Medical History:  Diagnosis Date  . Cancer [facility])    Right Breast Cancer  . Dysrhythmia    AF  . Hyperlipidemia   . Hypertension   . Hypothyroidism   . Obesity   . Pneumonia   . PONV (postoperative nausea and vomiting)   . Sleep apnea    cpcap    Past Surgical History:  Procedure Laterality Date  . BREAST SURGERY    . CARDIOVERSION N/A 06/26/2014   Procedure: CARDIOVERSION;  Surgeon: Laverda Page, MD;  Location: [place];  Service: Cardiovascular;  Laterality: N/A;  . CHOLECYSTECTOMY    . TOTAL KNEE ARTHROPLASTY Right 05/03/2016   Procedure: TOTAL KNEE ARTHROPLASTY;  Surgeon: Melrose Nakayama, MD;  Location: [place];  Service: Orthopedics;  Laterality: Right;    There were no vitals filed for this visit.  Subjective Assessment - 08/10/18 0846    Subjective  "It's nagging pain now, not a crying pain, nor constant.  I no longer have immediate pain with self massage with ball".   Pt is preparing for her TKR on Tuesday.      Patient Stated Goals  improve pain, wants dry needling; lost exercises    Currently in Pain?  Yes    Pain Score  4     Pain Location  Buttocks    Pain Orientation  Right    Pain Descriptors / Indicators  Aching;Nagging    Aggravating Factors   forward bending, sleeping in  the bed, rolling over.     Pain Relieving Factors  manual therapy/ DN, medication          [facility] PT Assessment - 08/10/18 0001      Assessment   Medical Diagnosis  LBP with Rt radiculopathy    Referring Provider (PT)  Dr. Melrose Nakayama    Onset Date/Surgical Date  --   2-3 weeks   Hand Dominance  Right    Next MD Visit  PRN    Prior Therapy  June 2019: at this clinic      Observation/Other Assessments   Focus on Therapeutic Outcomes (FOTO)   52% (48% limited)      Strength   Right Hip Flexion  --   5-/5   Right Hip ABduction  --   5-/5   Left Hip Flexion  4+/5    Right Knee Extension  5/5    Left Knee Extension  5/5       [facility] Adult PT Treatment/Exercise - 08/10/18 0001      Lumbar Exercises: Stretches   Passive Hamstring Stretch  3 reps;30 seconds;Left;Right   supine with strap   Piriformis Stretch  Right;Left;3 reps;30 seconds      Lumbar Exercises:  Aerobic   Nustep  L4 x 5 min      Lumbar Exercises: Sidelying   Clam  Right;10 reps;2 seconds      Moist Heat Therapy   Number Minutes Moist Heat  15 Minutes    Moist Heat Location  Hip   and Rt glutes     Electrical Stimulation   Electrical Stimulation Location  Rt buttock    Electrical Stimulation Action  IFC    Electrical Stimulation Parameters  to tolerance    Electrical Stimulation Goals  Pain      Manual Therapy   Manual Therapy  Soft tissue mobilization    Manual therapy comments  pt in Lt sidelying, skilled palpation and monitoring of soft tissue during DN    Soft tissue mobilization  STM including TPR to glute med and piriformis (very point tender)       Trigger Point Dry Needling - 08/10/18 1027    Consent Given?  Yes    Muscles Treated Lower Body  Gluteus maximus;Piriformis    Gluteus Maximus Response  Twitch response elicited;Palpable increased muscle length    Piriformis Response  Palpable increased muscle length;Twitch response elicited         PT Long Term Goals - 08/10/18 0857       PT LONG TERM GOAL #1   Title  independent with HEP    Time  4    Period  Weeks    Status  Achieved      PT LONG TERM GOAL #2   Title  FOTO score improved to </= 32% limited for improved function    Time  4    Period  Weeks    Status  Not Met      PT LONG TERM GOAL #3   Title  report pain < 5/10 with activity for improved function and mobility    Baseline  pain up to 3-4/10     Time  4    Period  Weeks    Status  Achieved      PT LONG TERM GOAL #4   Title  improve Rt hip strength to at least 4+/5 for improved function    Time  4    Period  Weeks    Status  Achieved      PT LONG TERM GOAL #5   Title  demonstrate improved mobility by performing lumbar ROM with pain < 4/10    Baseline  continues to have pain 4/10 or greater with lumbar flexion.     Time  4    Period  Weeks    Status  Partially Met            Plan - 08/10/18 0925    Clinical Impression Statement  Pt reporting overall decrease in pain in Rt hip/low back.  Her FOTO score was less, however per her report she is doing much better with pain now intermittent vs constant.  Pt responded well with manual therapy/DN with less pain then upon arrival.  Pt has partially met her goals, and requests to d/c today due to upcoming TKR surgery next week.     Rehab Potential  Good    PT Frequency  2x / week    PT Duration  6 weeks    PT Treatment/Interventions  ADLs/Self Care Home Management;Canalith Repostioning;Electrical Stimulation;Iontophoresis 72m/ml Dexamethasone;Cryotherapy;Functional mobility training;Stair training;Gait training;Ultrasound;Moist Heat;Therapeutic activities;Therapeutic exercise;Traction;Balance training;Neuromuscular re-education;Patient/family education;Manual techniques;Dry needling;Passive range of motion;Taping    PT Next Visit Plan  spoke to  supervising PT; will d/c at this time.     Consulted and Agree with Plan of Care  Patient       Patient will benefit from skilled therapeutic  intervention in order to improve the following deficits and impairments:  Abnormal gait, Difficulty walking, Increased fascial restricitons, Increased muscle spasms, Pain, Impaired flexibility, Decreased strength, Postural dysfunction, Decreased mobility  Visit Diagnosis: Chronic right-sided low back pain with right-sided sciatica  Muscle weakness (generalized)  Other abnormalities of gait and mobility     Problem List Patient Active Problem List   Diagnosis Date Noted  . Primary localized osteoarthritis of right knee 05/03/2016  . Primary osteoarthritis of right knee 05/03/2016   Kerin Perna, PTA 08/10/18 12:38 PM   Laureen Abrahams, PT, DPT 08/10/18 12:38 PM     [facility] [place], [place], [address] Phone: [phone]   Fax:  [phone]  Name: Robin Christensen MRN: [id number] Date of Birth: 1951-06-23       PHYSICAL THERAPY DISCHARGE SUMMARY  Visits from Start of Care: 6  Current functional level related to goals / functional outcomes: See above   Remaining deficits: See above; pt d/c'ed due to scheduled TKA   Education / Equipment: HEP  Plan: Patient agrees to discharge.  Patient goals were partially met. Patient is being discharged due to the patient's request.  ?????      Laureen Abrahams, PT, DPT 08/28/18 2:16 PM   Outpatient Rehab at [place], [place] [address]  [phone] (office) [phone] (fax)

## 2018-08-10 NOTE — Anesthesia Preprocedure Evaluation (Addendum)
Anesthesia Evaluation    History of Anesthesia Complications (+) PONV  Airway Mallampati: II  TM Distance: >3 FB Neck ROM: Full    Dental no notable dental hx.    Pulmonary sleep apnea ,    Pulmonary exam normal breath sounds clear to auscultation       Cardiovascular hypertension, Pt. on medications Normal cardiovascular exam+ dysrhythmias Atrial Fibrillation  Rhythm:Regular Rate:Normal     Neuro/Psych    GI/Hepatic   Endo/Other  Hypothyroidism   Renal/GU      Musculoskeletal  (+) Arthritis , Osteoarthritis,    Abdominal (+) + obese,   Peds  Hematology   Anesthesia Other Findings   Reproductive/Obstetrics                                                             Anesthesia Evaluation  Patient identified by MRN, date of birth, ID band Patient awake    Reviewed: Allergy & Precautions, H&P , NPO status , Patient's Chart, lab work & pertinent test results  History of Anesthesia Complications (+) PONV and history of anesthetic complications  Airway Mallampati: II  TM Distance: <3 FB Neck ROM: Full    Dental  (+) Teeth Intact   Pulmonary sleep apnea ,    Pulmonary exam normal        Cardiovascular hypertension, Pt. on medications + dysrhythmias Atrial Fibrillation  Rhythm:Irregular Rate:Abnormal     Neuro/Psych    GI/Hepatic   Endo/Other    Renal/GU      Musculoskeletal   Abdominal Normal abdominal exam  (+)   Peds  Hematology   Anesthesia Other Findings Is on eliquous, per cardiologist can hold for 3 days prior to surgery, can perform neuraxial per our protocol if has been off eliquous for 3 days  Reproductive/Obstetrics                             Anesthesia Physical  Anesthesia Plan  ASA: III  Anesthesia Plan: Spinal   Post-op Pain Management:    Induction: Intravenous  Airway Management Planned: Simple Face  Mask  Additional Equipment:   Intra-op Plan:   Post-operative Plan:   Informed Consent: I have reviewed the patients History and Physical, chart, labs and discussed the procedure including the risks, benefits and alternatives for the proposed anesthesia with the patient or authorized representative who has indicated his/her understanding and acceptance.   Dental advisory given  Plan Discussed with: CRNA and Surgeon  Anesthesia Plan Comments:         Anesthesia Quick Evaluation  Anesthesia Physical Anesthesia Plan  ASA: III  Anesthesia Plan: Spinal   Post-op Pain Management:  Regional for Post-op pain   Induction: Intravenous  PONV Risk Score and Plan: 3 and Ondansetron, Dexamethasone and Midazolam  Airway Management Planned: Simple Face Mask  Additional Equipment:   Intra-op Plan:   Post-operative Plan:   Informed Consent: I have reviewed the patients History and Physical, chart, labs and discussed the procedure including the risks, benefits and alternatives for the proposed anesthesia with the patient or authorized representative who has indicated his/her understanding and acceptance.     Dental advisory given  Plan Discussed with: CRNA  Anesthesia Plan Comments: (See PAT note by Karoline Caldwell, PA-C )  Anesthesia Quick Evaluation  

## 2018-08-13 MED ORDER — TRANEXAMIC ACID-NACL 1000-0.7 MG/100ML-% IV SOLN
1000.0000 mg | INTRAVENOUS | Status: AC
  Administered 2018-08-14: 1000 mg via INTRAVENOUS
  Filled 2018-08-13 (×2): qty 100

## 2018-08-13 MED ORDER — BUPIVACAINE LIPOSOME 1.3 % IJ SUSP
20.0000 mL | INTRAMUSCULAR | Status: AC
  Administered 2018-08-14: 20 mL
  Filled 2018-08-13: qty 20

## 2018-08-13 MED ORDER — CEFAZOLIN SODIUM-DEXTROSE 2-4 GM/100ML-% IV SOLN
2.0000 g | INTRAVENOUS | Status: AC
  Administered 2018-08-14: 2 g via INTRAVENOUS
  Filled 2018-08-13 (×2): qty 100

## 2018-08-13 MED ORDER — TRANEXAMIC ACID 1000 MG/10ML IV SOLN
2000.0000 mg | INTRAVENOUS | Status: AC
  Administered 2018-08-14: 2000 mg via TOPICAL
  Filled 2018-08-13: qty 20

## 2018-08-14 ENCOUNTER — Encounter (HOSPITAL_COMMUNITY)
Admission: RE | Disposition: A | Discharge status: Skilled Nursing Facility | Payer: Self-pay | Source: Home / Self Care | Attending: Orthopaedic Surgery

## 2018-08-14 ENCOUNTER — Encounter (HOSPITAL_COMMUNITY): Payer: Self-pay | Admitting: *Deleted

## 2018-08-14 ENCOUNTER — Other Ambulatory Visit: Payer: Self-pay

## 2018-08-14 ENCOUNTER — Ambulatory Visit (HOSPITAL_COMMUNITY): Payer: Medicare HMO | Admitting: Physician Assistant

## 2018-08-14 ENCOUNTER — Observation Stay (HOSPITAL_COMMUNITY)
Admission: RE | Admit: 2018-08-14 | Discharge: 2018-08-17 | Disposition: A | Discharge status: Skilled Nursing Facility | Payer: Medicare HMO | Attending: Orthopaedic Surgery | Admitting: Orthopaedic Surgery

## 2018-08-14 DIAGNOSIS — E039 Hypothyroidism, unspecified: Secondary | ICD-10-CM | POA: Insufficient documentation

## 2018-08-14 DIAGNOSIS — E669 Obesity, unspecified: Secondary | ICD-10-CM | POA: Insufficient documentation

## 2018-08-14 DIAGNOSIS — Z79899 Other long term (current) drug therapy: Secondary | ICD-10-CM | POA: Insufficient documentation

## 2018-08-14 DIAGNOSIS — Z6832 Body mass index (BMI) 32.0-32.9, adult: Secondary | ICD-10-CM | POA: Insufficient documentation

## 2018-08-14 DIAGNOSIS — M1712 Unilateral primary osteoarthritis, left knee: Principal | ICD-10-CM | POA: Insufficient documentation

## 2018-08-14 DIAGNOSIS — Z853 Personal history of malignant neoplasm of breast: Secondary | ICD-10-CM | POA: Insufficient documentation

## 2018-08-14 DIAGNOSIS — Z96651 Presence of right artificial knee joint: Secondary | ICD-10-CM | POA: Insufficient documentation

## 2018-08-14 DIAGNOSIS — I4891 Unspecified atrial fibrillation: Secondary | ICD-10-CM | POA: Insufficient documentation

## 2018-08-14 DIAGNOSIS — I10 Essential (primary) hypertension: Secondary | ICD-10-CM | POA: Insufficient documentation

## 2018-08-14 DIAGNOSIS — Z7901 Long term (current) use of anticoagulants: Secondary | ICD-10-CM | POA: Diagnosis not present

## 2018-08-14 DIAGNOSIS — Z9049 Acquired absence of other specified parts of digestive tract: Secondary | ICD-10-CM | POA: Insufficient documentation

## 2018-08-14 DIAGNOSIS — E785 Hyperlipidemia, unspecified: Secondary | ICD-10-CM | POA: Insufficient documentation

## 2018-08-14 DIAGNOSIS — G8918 Other acute postprocedural pain: Secondary | ICD-10-CM | POA: Diagnosis not present

## 2018-08-14 DIAGNOSIS — G4730 Sleep apnea, unspecified: Secondary | ICD-10-CM | POA: Insufficient documentation

## 2018-08-14 HISTORY — PX: TOTAL KNEE ARTHROPLASTY: SHX125

## 2018-08-14 LAB — APTT: aPTT: 37 seconds — ABNORMAL HIGH (ref 24–36)

## 2018-08-14 SURGERY — ARTHROPLASTY, KNEE, TOTAL
Anesthesia: Spinal | Laterality: Left

## 2018-08-14 MED ORDER — METHOCARBAMOL 1000 MG/10ML IJ SOLN
500.0000 mg | Freq: Four times a day (QID) | INTRAVENOUS | Status: DC | PRN
  Filled 2018-08-14: qty 5

## 2018-08-14 MED ORDER — LACTATED RINGERS IV SOLN: INTRAVENOUS | Status: DC | PRN

## 2018-08-14 MED ORDER — MIDAZOLAM HCL 2 MG/2ML IJ SOLN
INTRAMUSCULAR | Status: AC
  Filled 2018-08-14: qty 2

## 2018-08-14 MED ORDER — ONDANSETRON HCL 4 MG/2ML IJ SOLN
4.0000 mg | Freq: Four times a day (QID) | INTRAMUSCULAR | Status: DC | PRN
  Administered 2018-08-14: 4 mg via INTRAVENOUS
  Filled 2018-08-14: qty 2

## 2018-08-14 MED ORDER — DOCUSATE SODIUM 100 MG PO CAPS
100.0000 mg | ORAL_CAPSULE | Freq: Two times a day (BID) | ORAL | Status: DC
  Administered 2018-08-14 – 2018-08-17 (×6): 100 mg via ORAL
  Filled 2018-08-14 (×6): qty 1

## 2018-08-14 MED ORDER — FENTANYL CITRATE (PF) 250 MCG/5ML IJ SOLN
INTRAMUSCULAR | Status: AC
  Filled 2018-08-14: qty 5

## 2018-08-14 MED ORDER — ROPIVACAINE HCL 5 MG/ML IJ SOLN
INTRAMUSCULAR | Status: DC | PRN
  Administered 2018-08-14: 20 mL via PERINEURAL

## 2018-08-14 MED ORDER — BUPIVACAINE HCL (PF) 0.75 % IJ SOLN
INTRAMUSCULAR | Status: DC | PRN
  Administered 2018-08-14: 1.8 mL via INTRATHECAL

## 2018-08-14 MED ORDER — MORPHINE SULFATE (PF) 2 MG/ML IV SOLN: 0.5000 mg | INTRAVENOUS | Status: DC | PRN

## 2018-08-14 MED ORDER — PROPOFOL 10 MG/ML IV BOLUS
INTRAVENOUS | Status: AC
  Filled 2018-08-14: qty 20

## 2018-08-14 MED ORDER — HYDROMORPHONE HCL 1 MG/ML IJ SOLN
INTRAMUSCULAR | Status: AC
  Filled 2018-08-14: qty 1

## 2018-08-14 MED ORDER — MENTHOL 3 MG MT LOZG: 1.0000 | LOZENGE | OROMUCOSAL | Status: DC | PRN

## 2018-08-14 MED ORDER — ACETAMINOPHEN 500 MG PO TABS
500.0000 mg | ORAL_TABLET | Freq: Four times a day (QID) | ORAL | Status: AC
  Administered 2018-08-14 – 2018-08-15 (×3): 500 mg via ORAL
  Filled 2018-08-14 (×4): qty 1

## 2018-08-14 MED ORDER — FENTANYL CITRATE (PF) 100 MCG/2ML IJ SOLN
INTRAMUSCULAR | Status: AC
  Administered 2018-08-14: 100 ug
  Filled 2018-08-14: qty 2

## 2018-08-14 MED ORDER — PRAVASTATIN SODIUM 40 MG PO TABS
40.0000 mg | ORAL_TABLET | Freq: Every day | ORAL | Status: DC
  Administered 2018-08-14 – 2018-08-17 (×4): 40 mg via ORAL
  Filled 2018-08-14 (×4): qty 1

## 2018-08-14 MED ORDER — KETOROLAC TROMETHAMINE 15 MG/ML IJ SOLN
7.5000 mg | Freq: Four times a day (QID) | INTRAMUSCULAR | Status: AC
  Administered 2018-08-14 – 2018-08-15 (×4): 7.5 mg via INTRAVENOUS
  Filled 2018-08-14 (×3): qty 1

## 2018-08-14 MED ORDER — HYDROCODONE-ACETAMINOPHEN 7.5-325 MG PO TABS
1.0000 | ORAL_TABLET | ORAL | Status: DC | PRN
  Administered 2018-08-15 (×2): 2 via ORAL
  Filled 2018-08-14 (×2): qty 2

## 2018-08-14 MED ORDER — BISACODYL 5 MG PO TBEC: 5.0000 mg | DELAYED_RELEASE_TABLET | Freq: Every day | ORAL | Status: DC | PRN

## 2018-08-14 MED ORDER — HYDROCODONE-ACETAMINOPHEN 5-325 MG PO TABS
1.0000 | ORAL_TABLET | ORAL | Status: DC | PRN
  Administered 2018-08-14 – 2018-08-15 (×3): 2 via ORAL
  Administered 2018-08-16: 1 via ORAL
  Administered 2018-08-16 – 2018-08-17 (×2): 2 via ORAL
  Filled 2018-08-14 (×2): qty 2
  Filled 2018-08-14: qty 1
  Filled 2018-08-14 (×3): qty 2

## 2018-08-14 MED ORDER — BUPIVACAINE HCL (PF) 0.5 % IJ SOLN
INTRAMUSCULAR | Status: AC
  Filled 2018-08-14: qty 30

## 2018-08-14 MED ORDER — SODIUM CHLORIDE 0.9 % IR SOLN
Status: DC | PRN
  Administered 2018-08-14: 3000 mL

## 2018-08-14 MED ORDER — OXYCODONE HCL 5 MG PO TABS
ORAL_TABLET | ORAL | Status: AC
  Filled 2018-08-14: qty 1

## 2018-08-14 MED ORDER — METHOCARBAMOL 500 MG PO TABS
500.0000 mg | ORAL_TABLET | Freq: Four times a day (QID) | ORAL | Status: DC | PRN
  Administered 2018-08-14 – 2018-08-17 (×8): 500 mg via ORAL
  Filled 2018-08-14 (×7): qty 1

## 2018-08-14 MED ORDER — LIDOCAINE 2% (20 MG/ML) 5 ML SYRINGE
INTRAMUSCULAR | Status: DC | PRN
  Administered 2018-08-14: 30 mg via INTRAVENOUS

## 2018-08-14 MED ORDER — SODIUM CHLORIDE 0.9% FLUSH
INTRAVENOUS | Status: DC | PRN
  Administered 2018-08-14: 10 mL

## 2018-08-14 MED ORDER — SODIUM CHLORIDE 0.9 % IV SOLN
INTRAVENOUS | Status: DC | PRN
  Administered 2018-08-14: 10 ug/min via INTRAVENOUS

## 2018-08-14 MED ORDER — DIPHENHYDRAMINE HCL 12.5 MG/5ML PO ELIX: 12.5000 mg | ORAL_SOLUTION | ORAL | Status: DC | PRN

## 2018-08-14 MED ORDER — POTASSIUM CHLORIDE CRYS ER 20 MEQ PO TBCR
20.0000 meq | EXTENDED_RELEASE_TABLET | Freq: Every day | ORAL | Status: DC
  Administered 2018-08-14 – 2018-08-17 (×4): 20 meq via ORAL
  Filled 2018-08-14 (×4): qty 1

## 2018-08-14 MED ORDER — ONDANSETRON HCL 4 MG/2ML IJ SOLN
INTRAMUSCULAR | Status: AC
  Filled 2018-08-14: qty 2

## 2018-08-14 MED ORDER — MIDAZOLAM HCL 2 MG/2ML IJ SOLN
INTRAMUSCULAR | Status: AC
  Administered 2018-08-14: 2 mg
  Filled 2018-08-14: qty 2

## 2018-08-14 MED ORDER — OXYCODONE HCL 5 MG PO TABS
5.0000 mg | ORAL_TABLET | Freq: Once | ORAL | Status: AC | PRN
  Administered 2018-08-14: 5 mg via ORAL

## 2018-08-14 MED ORDER — METHOCARBAMOL 500 MG PO TABS
ORAL_TABLET | ORAL | Status: AC
  Filled 2018-08-14: qty 1

## 2018-08-14 MED ORDER — EPINEPHRINE PF 1 MG/ML IJ SOLN
INTRAMUSCULAR | Status: DC | PRN
  Administered 2018-08-14: 1 mg

## 2018-08-14 MED ORDER — PROPOFOL 500 MG/50ML IV EMUL
INTRAVENOUS | Status: DC | PRN
  Administered 2018-08-14: 75 ug/kg/min via INTRAVENOUS

## 2018-08-14 MED ORDER — LACTATED RINGERS IV SOLN
INTRAVENOUS | Status: DC
  Administered 2018-08-16: 22:00:00 via INTRAVENOUS

## 2018-08-14 MED ORDER — PROMETHAZINE HCL 25 MG/ML IJ SOLN
6.2500 mg | INTRAMUSCULAR | Status: DC | PRN
  Administered 2018-08-14: 6.25 mg via INTRAVENOUS

## 2018-08-14 MED ORDER — FUROSEMIDE 40 MG PO TABS
40.0000 mg | ORAL_TABLET | Freq: Every day | ORAL | Status: DC
  Administered 2018-08-14 – 2018-08-17 (×4): 40 mg via ORAL
  Filled 2018-08-14 (×4): qty 1

## 2018-08-14 MED ORDER — CEFAZOLIN SODIUM-DEXTROSE 2-4 GM/100ML-% IV SOLN
INTRAVENOUS | Status: AC
  Administered 2018-08-14: 2000 mg
  Filled 2018-08-14: qty 100

## 2018-08-14 MED ORDER — 0.9 % SODIUM CHLORIDE (POUR BTL) OPTIME
TOPICAL | Status: DC | PRN
  Administered 2018-08-14: 1000 mL

## 2018-08-14 MED ORDER — APIXABAN 5 MG PO TABS
5.0000 mg | ORAL_TABLET | Freq: Two times a day (BID) | ORAL | Status: DC
  Administered 2018-08-15 – 2018-08-17 (×5): 5 mg via ORAL
  Filled 2018-08-14 (×5): qty 1

## 2018-08-14 MED ORDER — PROMETHAZINE HCL 25 MG/ML IJ SOLN
INTRAMUSCULAR | Status: AC
  Filled 2018-08-14: qty 1

## 2018-08-14 MED ORDER — LEVOTHYROXINE SODIUM 75 MCG PO TABS
75.0000 ug | ORAL_TABLET | Freq: Every day | ORAL | Status: DC
  Administered 2018-08-15 – 2018-08-17 (×3): 75 ug via ORAL
  Filled 2018-08-14 (×3): qty 1

## 2018-08-14 MED ORDER — EPHEDRINE 5 MG/ML INJ
INTRAVENOUS | Status: AC
  Filled 2018-08-14: qty 10

## 2018-08-14 MED ORDER — ALUM & MAG HYDROXIDE-SIMETH 200-200-20 MG/5ML PO SUSP: 30.0000 mL | ORAL | Status: DC | PRN

## 2018-08-14 MED ORDER — BUPIVACAINE HCL (PF) 0.5 % IJ SOLN
INTRAMUSCULAR | Status: DC | PRN
  Administered 2018-08-14: 30 mL

## 2018-08-14 MED ORDER — PHENYLEPHRINE 40 MCG/ML (10ML) SYRINGE FOR IV PUSH (FOR BLOOD PRESSURE SUPPORT)
PREFILLED_SYRINGE | INTRAVENOUS | Status: AC
  Filled 2018-08-14: qty 10

## 2018-08-14 MED ORDER — METOCLOPRAMIDE HCL 5 MG PO TABS: 5.0000 mg | ORAL_TABLET | Freq: Three times a day (TID) | ORAL | Status: DC | PRN

## 2018-08-14 MED ORDER — OXYCODONE HCL 5 MG/5ML PO SOLN: 5.0000 mg | Freq: Once | ORAL | Status: AC | PRN

## 2018-08-14 MED ORDER — EPINEPHRINE PF 1 MG/ML IJ SOLN
INTRAMUSCULAR | Status: AC
  Filled 2018-08-14: qty 1

## 2018-08-14 MED ORDER — PHENOL 1.4 % MT LIQD: 1.0000 | OROMUCOSAL | Status: DC | PRN

## 2018-08-14 MED ORDER — CEFAZOLIN SODIUM-DEXTROSE 2-4 GM/100ML-% IV SOLN
2.0000 g | Freq: Four times a day (QID) | INTRAVENOUS | Status: AC
  Administered 2018-08-14 – 2018-08-15 (×2): 2 g via INTRAVENOUS
  Filled 2018-08-14 (×2): qty 100

## 2018-08-14 MED ORDER — KETOROLAC TROMETHAMINE 15 MG/ML IJ SOLN
INTRAMUSCULAR | Status: AC
  Filled 2018-08-14: qty 1

## 2018-08-14 MED ORDER — HYDROMORPHONE HCL 1 MG/ML IJ SOLN
0.2500 mg | INTRAMUSCULAR | Status: DC | PRN
  Administered 2018-08-14 (×4): 0.5 mg via INTRAVENOUS

## 2018-08-14 MED ORDER — TRANEXAMIC ACID-NACL 1000-0.7 MG/100ML-% IV SOLN
1000.0000 mg | Freq: Once | INTRAVENOUS | Status: AC
  Administered 2018-08-14: 1000 mg via INTRAVENOUS
  Filled 2018-08-14: qty 100

## 2018-08-14 MED ORDER — CHLORHEXIDINE GLUCONATE 4 % EX LIQD: 60.0000 mL | Freq: Once | CUTANEOUS | Status: DC

## 2018-08-14 MED ORDER — PROPOFOL 10 MG/ML IV BOLUS
INTRAVENOUS | Status: DC | PRN
  Administered 2018-08-14: 30 mg via INTRAVENOUS

## 2018-08-14 MED ORDER — ONDANSETRON HCL 4 MG PO TABS: 4.0000 mg | ORAL_TABLET | Freq: Four times a day (QID) | ORAL | Status: DC | PRN

## 2018-08-14 MED ORDER — METOCLOPRAMIDE HCL 5 MG/ML IJ SOLN: 5.0000 mg | Freq: Three times a day (TID) | INTRAMUSCULAR | Status: DC | PRN

## 2018-08-14 MED ORDER — ASPIRIN EC 325 MG PO TBEC
325.0000 mg | DELAYED_RELEASE_TABLET | Freq: Two times a day (BID) | ORAL | Status: DC
  Administered 2018-08-15 (×2): 325 mg via ORAL
  Filled 2018-08-14 (×2): qty 1

## 2018-08-14 MED ORDER — ZONISAMIDE 25 MG PO CAPS
50.0000 mg | ORAL_CAPSULE | Freq: Every day | ORAL | Status: DC
  Administered 2018-08-14 – 2018-08-16 (×3): 50 mg via ORAL
  Filled 2018-08-14 (×3): qty 2

## 2018-08-14 MED ORDER — ACETAMINOPHEN 325 MG PO TABS: 325.0000 mg | ORAL_TABLET | Freq: Four times a day (QID) | ORAL | Status: DC | PRN

## 2018-08-14 MED ORDER — SUCCINYLCHOLINE CHLORIDE 200 MG/10ML IV SOSY
PREFILLED_SYRINGE | INTRAVENOUS | Status: AC
  Filled 2018-08-14: qty 10

## 2018-08-14 MED ORDER — LACTATED RINGERS IV SOLN
INTRAVENOUS | Status: DC
  Administered 2018-08-14 (×2): via INTRAVENOUS

## 2018-08-14 MED ORDER — DEXAMETHASONE SODIUM PHOSPHATE 10 MG/ML IJ SOLN
INTRAMUSCULAR | Status: AC
  Filled 2018-08-14: qty 1

## 2018-08-14 SURGICAL SUPPLY — 58 items
BAG DECANTER FOR FLEXI CONT (MISCELLANEOUS) ×3 IMPLANT
BANDAGE ESMARK 6X9 LF (GAUZE/BANDAGES/DRESSINGS) ×1 IMPLANT
BLADE SAGITTAL 25.0X1.19X90 (BLADE) ×2 IMPLANT
BLADE SAGITTAL 25.0X1.19X90MM (BLADE) ×1
BLADE SAW SGTL 13.0X1.19X90.0M (BLADE) IMPLANT
BNDG ELASTIC 6X10 VLCR STRL LF (GAUZE/BANDAGES/DRESSINGS) ×3 IMPLANT
BNDG ESMARK 6X9 LF (GAUZE/BANDAGES/DRESSINGS) ×3
BOWL SMART MIX CTS (DISPOSABLE) ×3 IMPLANT
CEMENT HV SMART SET (Cement) ×6 IMPLANT
CLOSURE STERI-STRIP 1/2X4 (GAUZE/BANDAGES/DRESSINGS) ×1
CLSR STERI-STRIP ANTIMIC 1/2X4 (GAUZE/BANDAGES/DRESSINGS) ×2 IMPLANT
COMP FEM CEM STD+ LT LCS (Orthopedic Implant) ×3 IMPLANT
COMPONENT FEM CEM STD+ LT LCS (Orthopedic Implant) ×1 IMPLANT
COVER SURGICAL LIGHT HANDLE (MISCELLANEOUS) ×3 IMPLANT
COVER WAND RF STERILE (DRAPES) ×3 IMPLANT
CUFF TOURNIQUET SINGLE 34IN LL (TOURNIQUET CUFF) ×3 IMPLANT
CUFF TOURNIQUET SINGLE 44IN (TOURNIQUET CUFF) IMPLANT
DECANTER SPIKE VIAL GLASS SM (MISCELLANEOUS) ×3 IMPLANT
DRAPE EXTREMITY T 121X128X90 (DISPOSABLE) ×3 IMPLANT
DRAPE HALF SHEET 40X57 (DRAPES) ×6 IMPLANT
DRAPE U-SHAPE 47X51 STRL (DRAPES) ×3 IMPLANT
DRSG AQUACEL AG ADV 3.5X10 (GAUZE/BANDAGES/DRESSINGS) ×3 IMPLANT
DURAPREP 26ML APPLICATOR (WOUND CARE) ×9 IMPLANT
ELECT REM PT RETURN 9FT ADLT (ELECTROSURGICAL) ×3
ELECTRODE REM PT RTRN 9FT ADLT (ELECTROSURGICAL) ×1 IMPLANT
GLOVE BIO SURGEON STRL SZ8 (GLOVE) ×6 IMPLANT
GLOVE BIOGEL PI IND STRL 7.0 (GLOVE) ×1 IMPLANT
GLOVE BIOGEL PI IND STRL 8 (GLOVE) ×2 IMPLANT
GLOVE BIOGEL PI INDICATOR 7.0 (GLOVE) ×2
GLOVE BIOGEL PI INDICATOR 8 (GLOVE) ×4
GOWN STRL REUS W/ TWL LRG LVL3 (GOWN DISPOSABLE) ×1 IMPLANT
GOWN STRL REUS W/ TWL XL LVL3 (GOWN DISPOSABLE) ×2 IMPLANT
GOWN STRL REUS W/TWL LRG LVL3 (GOWN DISPOSABLE) ×2
GOWN STRL REUS W/TWL XL LVL3 (GOWN DISPOSABLE) ×4
HANDPIECE INTERPULSE COAX TIP (DISPOSABLE) ×2
HOOD PEEL AWAY FACE SHEILD DIS (HOOD) ×6 IMPLANT
INSERT LCL COMP RP STD 15.0MM (Knees) ×3 IMPLANT
KIT BASIN OR (CUSTOM PROCEDURE TRAY) ×3 IMPLANT
KIT TURNOVER KIT B (KITS) ×3 IMPLANT
MANIFOLD NEPTUNE II (INSTRUMENTS) ×3 IMPLANT
NEEDLE HYPO 21X1 ECLIPSE (NEEDLE) ×3 IMPLANT
NS IRRIG 1000ML POUR BTL (IV SOLUTION) ×3 IMPLANT
PACK TOTAL JOINT (CUSTOM PROCEDURE TRAY) ×3 IMPLANT
PAD ARMBOARD 7.5X6 YLW CONV (MISCELLANEOUS) ×6 IMPLANT
PATELLA DOME PFC 38MM (Knees) ×3 IMPLANT
PIN STEINMAN FIXATION KNEE (PIN) ×3 IMPLANT
SET HNDPC FAN SPRY TIP SCT (DISPOSABLE) ×1 IMPLANT
SUT VIC AB 0 CT1 27 (SUTURE) ×2
SUT VIC AB 0 CT1 27XBRD ANBCTR (SUTURE) ×1 IMPLANT
SUT VIC AB 2-0 CT1 27 (SUTURE) ×2
SUT VIC AB 2-0 CT1 TAPERPNT 27 (SUTURE) ×1 IMPLANT
SUT VIC AB 3-0 FS2 27 (SUTURE) ×3 IMPLANT
SUT VLOC 180 0 24IN GS25 (SUTURE) ×3 IMPLANT
SYR 50ML LL SCALE MARK (SYRINGE) ×3 IMPLANT
TOWEL OR 17X24 6PK STRL BLUE (TOWEL DISPOSABLE) ×6 IMPLANT
TOWEL OR 17X26 10 PK STRL BLUE (TOWEL DISPOSABLE) ×3 IMPLANT
TRAY CATH 16FR W/PLASTIC CATH (SET/KITS/TRAYS/PACK) IMPLANT
TRAY REVISION SZ 4 (Knees) ×3 IMPLANT

## 2018-08-14 NOTE — Anesthesia Procedure Notes (Signed)
Spinal  Patient location during procedure: OR Start time: 08/14/2018 9:33 AM End time: 08/14/2018 9:38 AM Staffing Anesthesiologist: Lynda Rainwater, MD Performed: anesthesiologist  Preanesthetic Checklist Completed: patient identified, site marked, surgical consent, pre-op evaluation, timeout performed, IV checked, risks and benefits discussed and monitors and equipment checked Spinal Block Patient position: sitting Prep: Betadine Patient monitoring: heart rate, continuous pulse ox and blood pressure Approach: midline Location: L3-4 Injection technique: single-shot Needle Needle type: Quincke  Needle gauge: 22 G Needle length: 9 cm

## 2018-08-14 NOTE — Progress Notes (Signed)
Physical Therapy Evaluation Patient Details Name: Robin Christensen MRN: [id number] DOB: 16-Dec-1950 Today's Date: 08/14/2018   History of Present Illness  Patient is 68 y/o female s/p L TKA. PMH includes HTN, dysrythmia, HLD, hx of R breast cancer, sleep apnea on CPAP, and hx of R TKA.   Clinical Impression  Patient admitted to hospital secondary to problems above and with deficits below. Patient required minA +2 to stand with RW for patient comfort. Session was limited due to patient feeling nauseous upon standing. Returned to supine and symptoms improved. Reviewed and education supine HEP and knee precautions. Patient will benefit from acute physical therapy to maximize independence and safety with functional mobility.     Follow Up Recommendations Follow surgeon's recommendation for DC plan and follow-up therapies    Equipment Recommendations  Other (comment)(TBD at next venue)    Recommendations for Other Services       Precautions / Restrictions Precautions Precautions: Knee Precaution Booklet Issued: Yes (comment) Precaution Comments: reviewed knee precautions with patient Restrictions Weight Bearing Restrictions: Yes LLE Weight Bearing: Weight bearing as tolerated      Mobility  Bed Mobility Overal bed mobility: Needs Assistance Bed Mobility: Supine to Sit;Sit to Supine     Supine to sit: Supervision Sit to supine: Min assist   General bed mobility comments: Patient required supervision for supine to sit for safety, and minA for sit to supine for LLE assist.  Dizziness reported while sitting EOB that did improve with time.   Transfers Overall transfer level: Needs assistance Equipment used: Rolling walker (2 wheeled) Transfers: Sit to/from Stand Sit to Stand: Min assist;+2 physical assistance         General transfer comment: Patient required minA +2 for patient comfort with RW for sit to stand. Verbal cues for hand placement on RW. Patient reported feeling  nauseous upon standing therefore further mobility deferred. Returned to supine and symptoms improved.   Ambulation/Gait                Stairs            Wheelchair Mobility    Modified Rankin (Stroke Patients Only)       Balance Overall balance assessment: Needs assistance Sitting-balance support: Feet supported;No upper extremity supported Sitting balance-Leahy Scale: Fair     Standing balance support: Bilateral upper extremity supported Standing balance-Leahy Scale: Poor Standing balance comment: reliant on BUE support and use of RW to maintain standing balance                             Pertinent Vitals/Pain Pain Assessment: 0-10 Pain Score: 5  Pain Location: L knee Pain Descriptors / Indicators: Aching;Operative site guarding Pain Intervention(s): Limited activity within patient's tolerance;Monitored during session;Ice applied    Home Living Family/patient expects to be discharged to:: Skilled nursing facility Living Arrangements: Alone                    Prior Function Level of Independence: Independent               Hand Dominance        Extremity/Trunk Assessment   Upper Extremity Assessment Upper Extremity Assessment: Overall WFL for tasks assessed    Lower Extremity Assessment Lower Extremity Assessment: Generalized weakness;LLE deficits/detail LLE Deficits / Details: LLE deficits consistent with post op pain and weakness    Cervical / Trunk Assessment Cervical / Trunk Assessment: Normal  Communication  Communication: No difficulties  Cognition Arousal/Alertness: Awake/alert Behavior During Therapy: WFL for tasks assessed/performed Overall Cognitive Status: Within Functional Limits for tasks assessed                                        General Comments      Exercises Total Joint Exercises Ankle Circles/Pumps: AROM;Both;20 reps;Supine Quad Sets: AROM;Left;10 reps;Supine Heel  Slides: AROM;Left;5 reps;Supine   Assessment/Plan    PT Assessment Patient needs continued PT services  PT Problem List Decreased strength;Decreased range of motion;Decreased activity tolerance;Decreased balance;Decreased knowledge of use of DME;Decreased knowledge of precautions;Pain;Decreased mobility       PT Treatment Interventions DME instruction;Gait training;Functional mobility training;Therapeutic activities;Therapeutic exercise;Balance training;Patient/family education    PT Goals (Current goals can be found in the Care Plan section)  Acute Rehab PT Goals Patient Stated Goal: to feel better PT Goal Formulation: With patient Time For Goal Achievement: 08/28/18 Potential to Achieve Goals: Good    Frequency 7X/week   Barriers to discharge        Co-evaluation               AM-PAC PT "6 Clicks" Mobility  Outcome Measure Help needed turning from your back to your side while in a flat bed without using bedrails?: None Help needed moving from lying on your back to sitting on the side of a flat bed without using bedrails?: A Little Help needed moving to and from a bed to a chair (including a wheelchair)?: A Little Help needed standing up from a chair using your arms (e.g., wheelchair or bedside chair)?: A Little Help needed to walk in hospital room?: A Lot Help needed climbing 3-5 steps with a railing? : A Lot 6 Click Score: 17    End of Session Equipment Utilized During Treatment: Gait belt Activity Tolerance: Treatment limited secondary to medical complications (Comment)(nausea ) Patient left: in bed;with call bell/phone within reach Nurse Communication: Mobility status;Other (comment)(nausea) PT Visit Diagnosis: Muscle weakness (generalized) (M62.81);Difficulty in walking, not elsewhere classified (R26.2);Pain Pain - Right/Left: Left Pain - part of body: Knee    Time: 1710-1731 PT Time Calculation (min) (ACUTE ONLY): 21 min   Charges:   PT Evaluation $PT  Eval Low Complexity: 1 Low          Erick Blinks, SPT  Erick Blinks 08/14/2018, 6:40 PM

## 2018-08-14 NOTE — Interval H&P Note (Signed)
History and Physical Interval Note:  08/14/2018 7:23 AM  Robin Christensen  has presented today for surgery, with the diagnosis of LEFT KNEE DEGENERATIVE JOINT DISEASE  The various methods of treatment have been discussed with the patient and family. After consideration of risks, benefits and other options for treatment, the patient has consented to  Procedure(s): TOTAL KNEE ARTHROPLASTY (Left) as a surgical intervention .  The patient's history has been reviewed, patient examined, no change in status, stable for surgery.  I have reviewed the patient's chart and labs.  Questions were answered to the patient's satisfaction.     Hessie Dibble

## 2018-08-14 NOTE — Transfer of Care (Signed)
Immediate Anesthesia Transfer of Care Note  Patient: Robin Christensen  Procedure(s) Performed: TOTAL KNEE ARTHROPLASTY (Left )  Patient Location: PACU  Anesthesia Type:Spinal  Level of Consciousness: awake, alert  and oriented  Airway & Oxygen Therapy: Patient Spontanous Breathing and Patient connected to nasal cannula oxygen  Post-op Assessment: Report given to RN and Post -op Vital signs reviewed and stable  Post vital signs: Reviewed and stable  Last Vitals:  Vitals Value Taken Time  BP 134/112 08/14/2018 11:36 AM  Temp    Pulse 66 08/14/2018 11:39 AM  Resp 17 08/14/2018 11:39 AM  SpO2 100 % 08/14/2018 11:39 AM  Vitals shown include unvalidated device data.  Last Pain:  Vitals:   08/14/18 0737  TempSrc: Oral         Complications: No apparent anesthesia complications

## 2018-08-14 NOTE — Op Note (Signed)
PREOP DIAGNOSIS: DJD LEFT KNEE POSTOP DIAGNOSIS:  same PROCEDURE: LEFT TKR ANESTHESIA: Spinal and MAC ATTENDING SURGEON: Hessie Dibble ASSISTANT: Loni Dolly PA  INDICATIONS FOR PROCEDURE: Robin Christensen is a 68 y.o. female who has struggled for a long time with pain due to degenerative arthritis of the left knee.  The patient has failed many conservative non-operative measures and at this point has pain which limits the ability to sleep and walk.  The patient is offered total knee replacement.  Informed operative consent was obtained after discussion of possible risks of anesthesia, infection, neurovascular injury, DVT, and death.  The importance of the post-operative rehabilitation protocol to optimize result was stressed extensively with the patient.  SUMMARY OF FINDINGS AND PROCEDURE:  Robin Christensen was taken to the operative suite where under the above anesthesia a left knee replacement was performed.  There were advanced degenerative changes and the bone quality was fair.  We used the DePuyLCS system and placed size standard plus femur, 4 MBT revision tibia, 38 mm all polyethylene patella, and a size 15 mm spacer.  Loni Dolly PA-C assisted throughout and was invaluable to the completion of the case in that he helped retract and maintain exposure while I placed the components.  He also helped close thereby minimizing OR time.  The patient was admitted for appropriate post-op care to include perioperative antibiotics and mechanical and pharmacologic measures for DVT prophylaxis.  DESCRIPTION OF PROCEDURE:  Robin Christensen was taken to the operative suite where the above anesthesia was applied.  The patient was positioned supine and prepped and draped in normal sterile fashion.  An appropriate time out was performed.  After the administration of kefzol pre-op antibiotic the leg was elevated and exsanguinated and a tourniquet inflated.  A standard longitudinal incision was made on the  anterior knee.  Dissection was carried down to the extensor mechanism.  All appropriate anti-infective measures were used including the pre-operative antibiotic, betadine impregnated drape, and closed hooded exhaust systems for each member of the surgical team.  A medial parapatellar incision was made in the extensor mechanism and the knee cap flipped and the knee flexed.  Some residual meniscal tissues were removed along with any remaining ACL/PCL tissue.  A guide was placed on the tibia and a flat cut was made on it's superior surface.  An intramedullary guide was placed in the femur and was utilized to make anterior and posterior cuts creating an appropriate flexion gap.  A second intramedullary guide was placed in the femur to make a distal cut properly balancing the knee with an extension gap equal to the flexion gap.  The three bones sized to the above mentioned sizes and the appropriate guides were placed and utilized.  A trial reduction was done and the knee easily came to full extension and the patella tracked well on flexion.  The trial components were removed and all bones were cleaned with pulsatile lavage and then dried thoroughly.  Cement was mixed and was pressurized onto the bones followed by placement of the aforementioned components.  Excess cement was trimmed and pressure was held on the components until the cement had hardened.  The tourniquet was deflated and a small amount of bleeding was controlled with cautery and pressure.  The knee was irrigated thoroughly.  The extensor mechanism was re-approximated with V-loc suture in running fashion.  The knee was flexed and the repair was solid.  The subcutaneous tissues were re-approximated with #0 and #2-0 vicryl  and the skin closed with a subcuticular stitch and steristrips.  A sterile dressing was applied.  Intraoperative fluids, EBL, and tourniquet time can be obtained from anesthesia records.  DISPOSITION:  The patient was taken to recovery  room in stable condition and admitted for appropriate post-op care to include peri-operative antibiotic and DVT prophylaxis with mechanical and pharmacologic measures.  Robin Christensen 08/14/2018, 11:02 AM

## 2018-08-14 NOTE — Anesthesia Procedure Notes (Signed)
Anesthesia Regional Block: Adductor canal block   Pre-Anesthetic Checklist: ,, timeout performed, Correct Patient, Correct Site, Correct Laterality, Correct Procedure, Correct Position, site marked, Risks and benefits discussed,  Surgical consent,  Pre-op evaluation,  At surgeon's request and post-op pain management  Laterality: Left  Prep: chloraprep       Needles:  Injection technique: Single-shot  Needle Type: Stimiplex     Needle Length: 9cm  Needle Gauge: 21     Additional Needles:   Procedures:,,,, ultrasound used (permanent image in chart),,,,  Narrative:  Start time: 08/14/2018 8:49 AM End time: 08/14/2018 8:51 AM Injection made incrementally with aspirations every 5 mL.  Performed by: Personally  Anesthesiologist: Lynda Rainwater, MD

## 2018-08-14 NOTE — Anesthesia Postprocedure Evaluation (Signed)
Anesthesia Post Note  Patient: Robin Christensen  Procedure(s) Performed: TOTAL KNEE ARTHROPLASTY (Left )     Patient location during evaluation: PACU Anesthesia Type: Spinal Level of consciousness: oriented and awake and alert Pain management: pain level controlled Vital Signs Assessment: post-procedure vital signs reviewed and stable Respiratory status: spontaneous breathing, respiratory function stable and patient connected to nasal cannula oxygen Cardiovascular status: blood pressure returned to baseline and stable Postop Assessment: no headache, no backache and no apparent nausea or vomiting Anesthetic complications: no    Last Vitals:  Vitals:   08/14/18 1252 08/14/18 1256  BP: (!) 144/61   Pulse:  (!) 56  Resp:  14  Temp:    SpO2:  99%    Last Pain:  Vitals:   08/14/18 1245  TempSrc:   PainSc: Asleep                 Lynda Rainwater

## 2018-08-15 ENCOUNTER — Encounter (HOSPITAL_COMMUNITY): Payer: Self-pay | Admitting: Orthopaedic Surgery

## 2018-08-15 DIAGNOSIS — M1712 Unilateral primary osteoarthritis, left knee: Secondary | ICD-10-CM | POA: Diagnosis not present

## 2018-08-15 DIAGNOSIS — E785 Hyperlipidemia, unspecified: Secondary | ICD-10-CM | POA: Diagnosis not present

## 2018-08-15 DIAGNOSIS — I10 Essential (primary) hypertension: Secondary | ICD-10-CM | POA: Diagnosis not present

## 2018-08-15 DIAGNOSIS — Z853 Personal history of malignant neoplasm of breast: Secondary | ICD-10-CM | POA: Diagnosis not present

## 2018-08-15 DIAGNOSIS — Z6832 Body mass index (BMI) 32.0-32.9, adult: Secondary | ICD-10-CM | POA: Diagnosis not present

## 2018-08-15 DIAGNOSIS — E039 Hypothyroidism, unspecified: Secondary | ICD-10-CM | POA: Diagnosis not present

## 2018-08-15 DIAGNOSIS — I4891 Unspecified atrial fibrillation: Secondary | ICD-10-CM | POA: Diagnosis not present

## 2018-08-15 DIAGNOSIS — G4730 Sleep apnea, unspecified: Secondary | ICD-10-CM | POA: Diagnosis not present

## 2018-08-15 DIAGNOSIS — E669 Obesity, unspecified: Secondary | ICD-10-CM | POA: Diagnosis not present

## 2018-08-15 NOTE — Progress Notes (Signed)
Physical Therapy Treatment Patient Details Name: Robin Christensen MRN: [id number] DOB: [birth year] Today's Date: 08/15/2018    History of Present Illness Patient is 68 y/o female s/p L TKA. PMH includes HTN, dysrythmia, HLD, hx of R breast cancer, sleep apnea on CPAP, and hx of R TKA.     PT Comments    Continuing work on functional mobility and activity tolerance;  Overall , knee is doing well, nice and stable in stance with no buckling; Overall, though, Robin Christensen is showing significantly decr upright activity tolerance with bouts at progressive amb; after waling 10-25 feet, she reports a "sinking" feeling, and difficulty getting a deep breath in, with extreme fatigue as well; Unable to stand long enough to obtain a standing BP, but the reading once she sat was 82/25, RN informed; positioned semi-supine in recliner and BP came back to 120/57; HR ranged 55-65 throughotu session and O2 sats on Room Air 99-100%  Will plan for a proper set of orthostatics next PT session, and requested that nursing get a set of orthostatic BPs this evening  Follow Up Recommendations  Follow surgeon's recommendation for DC plan and follow-up therapies     Equipment Recommendations  Other (comment)(TBD at next venue)    Recommendations for Other Services       Precautions / Restrictions Precautions Precautions: Knee Precaution Comments: Pt educated to not allow any pillow or bolster under knee for healing with optimal range of motion.  Restrictions LLE Weight Bearing: Weight bearing as tolerated    Mobility  Bed Mobility Overal bed mobility: Needs Assistance Bed Mobility: Supine to Sit     Supine to sit: Supervision     General bed mobility comments: Cues to self-monitor for activity tolerance  Transfers Overall transfer level: Needs assistance Equipment used: Rolling walker (2 wheeled) Transfers: Sit to/from Stand Sit to Stand: Min assist;+2 physical assistance         General transfer  comment: Min assist and second person present for safety given decr activity tolerance previous session; Cues for ahnd placement and safety, and min assist to control descent to chair  Ambulation/Gait Ambulation/Gait assistance: Min assist;+2 safety/equipment Gait Distance (Feet): 25 [id number]) Assistive device: Rolling walker (2 wheeled) Gait Pattern/deviations: Decreased step length - right;Decreased step length - left;Trunk flexed     General Gait Details: Cues for posture, and to activate quad for L stance stability; no L knee buckling noted; Difficulty ambulating today due to decr upright activity tolerance; reported fatigue, feeling like she was "sinkng", difficulty getting a breath in, and tended to prop elbows on RW; Attempted standing BP, but unable to stand long enough to get a reading; BP read 82/25 sitting immediately after try at getting BP in standing; elevated feet, and let shair back to near supine, and BP recovered to 120/57; RN notified   Stairs             Wheelchair Mobility    Modified Rankin (Stroke Patients Only)       Balance     Sitting balance-Leahy Scale: Fair       Standing balance-Leahy Scale: Poor                              Cognition Arousal/Alertness: Awake/alert Behavior During Therapy: WFL for tasks assessed/performed Overall Cognitive Status: Within Functional Limits for tasks assessed  Exercises Total Joint Exercises Ankle Circles/Pumps: AROM;Both;20 reps;Supine Quad Sets: AROM;Left;10 reps;Supine Towel Squeeze: AROM;Both;10 reps Short Arc Quad: AROM;Left;10 reps Heel Slides: AAROM;Left;10 reps Straight Leg Raises: AROM;Left;10 reps Goniometric ROM: approx 0-78 deg    General Comments General comments (skin integrity, edema, etc.):   08/15/18 1200 08/15/18 1201 08/15/18 1205  Orthostatic Lying   BP- Lying  --   --  120/57 (Near supine in recliner)  Pulse-  Lying  --   --  58  Orthostatic Sitting  BP- Sitting 102/52 (!) 82/25 (Seated, immediately after standing attempt)  --   Pulse- Sitting 54 62  --   Orthostatic Standing at 0 minutes  BP- Standing at 0 minutes  (Attempted; Unable to stand long enough to get a reading; symptomatic for dizzy, "sinking" feeling)  --   --          Pertinent Vitals/Pain Pain Assessment: Faces Faces Pain Scale: Hurts little more Pain Location: L knee Pain Descriptors / Indicators: Aching;Operative site guarding Pain Intervention(s): Monitored during session    Home Living                      Prior Function            PT Goals (current goals can now be found in the care plan section) Acute Rehab PT Goals Patient Stated Goal: to feel better PT Goal Formulation: With patient Time For Goal Achievement: 08/28/18 Potential to Achieve Goals: Good Progress towards PT goals: Progressing toward goals(even though limited by decr upright activity tolerance)    Frequency    7X/week      PT Plan Current plan remains appropriate    Co-evaluation              AM-PAC PT "6 Clicks" Mobility   Outcome Measure  Help needed turning from your back to your side while in a flat bed without using bedrails?: None Help needed moving from lying on your back to sitting on the side of a flat bed without using bedrails?: A Little Help needed moving to and from a bed to a chair (including a wheelchair)?: A Little Help needed standing up from a chair using your arms (e.g., wheelchair or bedside chair)?: A Little Help needed to walk in hospital room?: A Lot Help needed climbing 3-5 steps with a railing? : Total 6 Click Score: 16    End of Session Equipment Utilized During Treatment: Gait belt Activity Tolerance: Patient limited by fatigue;Other (comment)(and orthostatic hypotension) Patient left: in chair;with call bell/phone within reach;Other (comment)(recliner in near supine position) Nurse  Communication: Mobility status;Other (comment)(LOW BPs) PT Visit Diagnosis: Muscle weakness (generalized) (M62.81);Difficulty in walking, not elsewhere classified (R26.2);Pain Pain - Right/Left: Left Pain - part of body: Knee     Time: 0973-5329 PT Time Calculation (min) (ACUTE ONLY): 51 min  Charges:  $Gait Training: 8-22 mins $Therapeutic Exercise: 8-22 mins $Therapeutic Activity: 8-22 mins                     Roney Marion, PT  Acute Rehabilitation Services Pager [phone] Office [place] 08/15/2018, 1:41 PM

## 2018-08-15 NOTE — Progress Notes (Signed)
Subjective: 1 Day Post-Op Procedure(s) (LRB): TOTAL KNEE ARTHROPLASTY (Left)   Patient resting comfortably in bed this morning. Her plan is to go to [facility] tomorrow.  Activity level:  wbat Diet tolerance:  ok Voiding:  Foley out this morning Patient reports pain as mild.    Objective: Vital signs in last 24 hours: Temp:  [97.2 F (36.2 C)-98.4 F (36.9 C)] 97.7 F (36.5 C) (01/29 0402) Pulse Rate:  [55-112] 59 (01/29 0402) Resp:  [12-17] 16 (01/29 0402) BP: (97-152)/(46-112) 108/46 (01/29 0402) SpO2:  [94 %-100 %] 100 % (01/29 0402)  Labs: No results for input(s): HGB in the last 72 hours. No results for input(s): WBC, RBC, HCT, PLT in the last 72 hours. No results for input(s): NA, K, CL, CO2, BUN, CREATININE, GLUCOSE, CALCIUM in the last 72 hours. No results for input(s): LABPT, INR in the last 72 hours.  Physical Exam:  Neurologically intact ABD soft Neurovascular intact Sensation intact distally Intact pulses distally Dorsiflexion/Plantar flexion intact Incision: dressing C/D/I and no drainage No cellulitis present Compartment soft  Assessment/Plan:  1 Day Post-Op Procedure(s) (LRB): TOTAL KNEE ARTHROPLASTY (Left) Advance diet Up with therapy D/C IV fluids Plan for discharge tomorrow Discharge to SNF [facility] if they have a bed ready and she does well with PT. Resume home eliquis this morning.  Follow up in office 2 weeks post op. Anticipated LOS equal to or greater than 2 midnights due to - Age 69 and older with one or more of the following:  - Obesity  - Expected need for hospital services (PT, OT, Nursing) required for safe  discharge  - Anticipated need for postoperative skilled nursing care or inpatient rehab  - Active co-morbidities: None OR   - Unanticipated findings during/Post Surgery: Slow post-op progression: GI, pain control, mobility  - Patient is a high risk of re-admission due to: Barriers to post-acute care (logistical, no  family support in home)  [place] 08/15/2018, 7:19 AM

## 2018-08-15 NOTE — Progress Notes (Addendum)
In to administer medications. Pt stated that she needed to get up and use BR. Pt assisted to EoB. Pt ambulated to BR and back to bed with roloong walker and staff supervision. Pt tolerated activity well

## 2018-08-15 NOTE — NC FL2 (Signed)
[place] LEVEL OF CARE SCREENING TOOL     IDENTIFICATION  Patient Name: Robin Christensen Birthdate: 11-15-1950 Sex: female Admission Date (Current Location): 08/14/2018  [facility] and [place] Number:  [employer] and Address:  [facility] [place], [place] [address], [place], [place] [address]      Provider Number: [id number]  Attending Physician Name and Address:  Melrose Nakayama, MD  Relative Name and Phone Number:  Lissa Hoard (son) [phone]    Current Level of Care: Hospital Recommended Level of Care: [place] Prior Approval Number:    Date Approved/Denied: 05/04/16 PASRR Number: [id number]  Discharge Plan: SNF    Current Diagnoses: Patient Active Problem List   Diagnosis Date Noted  . Primary osteoarthritis of left knee 08/14/2018  . Primary localized osteoarthritis of right knee 05/03/2016  . Primary osteoarthritis of right knee 05/03/2016    Orientation RESPIRATION BLADDER Height & Weight     Self, Time, Situation, Place  O2(nasal cannula 2L/min) Continent Weight:   Height:     BEHAVIORAL SYMPTOMS/MOOD NEUROLOGICAL BOWEL NUTRITION STATUS      Continent Diet(see discharge summary)  AMBULATORY STATUS COMMUNICATION OF NEEDS Skin   Extensive Assist Verbally Other (Comment), Surgical wounds(left knee closed surgical incision)                       Personal Care Assistance Level of Assistance  Bathing, Feeding, Dressing, Total care Bathing Assistance: Maximum assistance Feeding assistance: Limited assistance Dressing Assistance: Maximum assistance Total Care Assistance: Maximum assistance   Functional Limitations Info  Sight, Hearing, Speech Sight Info: Adequate   Speech Info: Adequate    SPECIAL CARE FACTORS FREQUENCY  PT (By licensed PT), OT (By licensed OT)     PT Frequency: min 5x weekly OT Frequency: min 5x weekly            Contractures Contractures Info: Not present    Additional  Factors Info  Code Status, Allergies Code Status Info: full Allergies Info: Xarelto           Current Medications (08/15/2018):  This is the current hospital active medication list Current Facility-Administered Medications  Medication Dose Route Frequency Provider Last Rate Last Dose  . acetaminophen (TYLENOL) tablet 325-650 mg  325-650 mg Oral Q6H PRN Loni Dolly, PA-C      . acetaminophen (TYLENOL) tablet 500 mg  500 mg Oral Q6H Loni Dolly, PA-C   500 mg at 08/15/18 7078  . alum & mag hydroxide-simeth (MAALOX/MYLANTA) 200-200-20 MG/5ML suspension 30 mL  30 mL Oral Q4H PRN Loni Dolly, PA-C      . apixaban (ELIQUIS) tablet 5 mg  5 mg Oral BID Loni Dolly, PA-C   5 mg at 08/15/18 1041  . aspirin EC tablet 325 mg  325 mg Oral BID PC Loni Dolly, PA-C   325 mg at 08/15/18 1040  . bisacodyl (DULCOLAX) EC tablet 5 mg  5 mg Oral Daily PRN Loni Dolly, PA-C      . diphenhydrAMINE (BENADRYL) 12.5 MG/5ML elixir 12.5-25 mg  12.5-25 mg Oral Q4H PRN Loni Dolly, PA-C      . docusate sodium (COLACE) capsule 100 mg  100 mg Oral BID Loni Dolly, PA-C   100 mg at 08/15/18 1040  . furosemide (LASIX) tablet 40 mg  40 mg Oral Daily Loni Dolly, PA-C   40 mg at 08/15/18 1040  . HYDROcodone-acetaminophen (NORCO) 7.5-325 MG per tablet 1-2 tablet  1-2 tablet Oral Q4H  PRN Loni Dolly, PA-C   2 tablet at 08/15/18 0449  . HYDROcodone-acetaminophen (NORCO/VICODIN) 5-325 MG per tablet 1-2 tablet  1-2 tablet Oral Q4H PRN Loni Dolly, PA-C   2 tablet at 08/15/18 1040  . lactated ringers infusion   Intravenous Continuous Loni Dolly, PA-C   Stopped at 08/15/18 0701  . levothyroxine (SYNTHROID, LEVOTHROID) tablet 75 mcg  75 mcg Oral QAC breakfast Loni Dolly, PA-C   75 mcg at 08/15/18 3335  . menthol-cetylpyridinium (CEPACOL) lozenge 3 mg  1 lozenge Oral PRN Loni Dolly, PA-C       Or  . phenol (CHLORASEPTIC) mouth spray 1 spray  1 spray Mouth/Throat PRN Loni Dolly, PA-C      . methocarbamol (ROBAXIN)  tablet 500 mg  500 mg Oral Q6H PRN Loni Dolly, PA-C   500 mg at 08/15/18 0449   Or  . methocarbamol (ROBAXIN) 500 mg in dextrose 5 % 50 mL IVPB  500 mg Intravenous Q6H PRN Loni Dolly, PA-C      . metoCLOPramide (REGLAN) tablet 5-10 mg  5-10 mg Oral Q8H PRN Loni Dolly, PA-C       Or  . metoCLOPramide (REGLAN) injection 5-10 mg  5-10 mg Intravenous Q8H PRN Loni Dolly, PA-C      . morphine 2 MG/ML injection 0.5-1 mg  0.5-1 mg Intravenous Q2H PRN Loni Dolly, PA-C      . ondansetron [facility]) tablet 4 mg  4 mg Oral Q6H PRN Loni Dolly, PA-C       Or  . ondansetron (ZOFRAN) injection 4 mg  4 mg Intravenous Q6H PRN Loni Dolly, PA-C   4 mg at 08/14/18 1852  . potassium chloride SA (K-DUR,KLOR-CON) CR tablet 20 mEq  20 mEq Oral Daily Loni Dolly, PA-C   20 mEq at 08/15/18 1041  . pravastatin (PRAVACHOL) tablet 40 mg  40 mg Oral Daily Loni Dolly, PA-C   40 mg at 08/15/18 1040  . zonisamide (ZONEGRAN) capsule 50 mg  50 mg Oral QHS Loni Dolly, PA-C   50 mg at 08/14/18 2245     Discharge Medications: Please see discharge summary for a list of discharge medications.  Relevant Imaging Results:  Relevant Lab Results:   Additional Information SSN: [id number]  Alberteen Sam, LCSW

## 2018-08-15 NOTE — Progress Notes (Signed)
[facility] will initiate [employer] [facility] authorization today.   Carlos, [place]

## 2018-08-15 NOTE — Discharge Instructions (Signed)

## 2018-08-15 NOTE — Plan of Care (Signed)
  Problem: Education: Goal: Knowledge of General Education information will improve Description Including pain rating scale, medication(s)/side effects and non-pharmacologic comfort measures Outcome: Progressing   Problem: Health Behavior/Discharge Planning: Goal: Ability to manage health-related needs will improve Outcome: Progressing   Problem: Clinical Measurements: Goal: Will remain free from infection Outcome: Progressing   Problem: Clinical Measurements: Goal: Cardiovascular complication will be avoided Outcome: Progressing   

## 2018-08-15 NOTE — Clinical Social Work Note (Signed)
Clinical Social Work Assessment  Patient Details  Name: Robin Christensen MRN: [id number] Date of Birth: May 30, 1951  Date of referral:  08/15/18               Reason for consult:  Discharge Planning                Permission sought to share information with:  Case Manager, Facility Sport and exercise psychologist, Family Supports Permission granted to share information::     Name::        Agency::  SNFs  Relationship::     Contact Information:     Housing/Transportation Living arrangements for the past 2 months:  Skilled Nursing Facility([facility]) Source of Information:  Patient Patient Interpreter Needed:  None Criminal Activity/Legal Involvement Pertinent to Current Situation/Hospitalization:  No - Comment as needed Significant Relationships:  Adult Children(Son Robin Christensen) Lives with:  Facility Resident Do you feel safe going back to the place where you live?  Yes Need for family participation in patient care:  No (Coment)  Care giving concerns:  CSW received referral for possible SNF placement at time of discharge. Spoke with patient regarding possibility of SNF placement . Patient's son  is currently unable to care for her at their home given patient's current needs and fall risk.  Patient  expressed understanding of PT recommendation and are agreeable to SNF placement at time of discharge. CSW to continue to follow and assist with discharge planning needs.     Social Worker assessment / plan:  Spoke with patient  concerning possibility of rehab at SNF before returning home. Patient is from [facility] and would like to return.    Employment status:  Retired Nurse, adult PT Recommendations:  [place] / Referral to community resources:  [place]  Patient/Family's Response to care:  Patient recognizes need for rehab before returning home and are agreeable to a SNF in [place]. They report preference for   [facility] . CSW explained insurance authorization process. Patient's family reported that they want patient to get stronger to be able to come back home.    Patient/Family's Understanding of and Emotional Response to Diagnosis, Current Treatment, and Prognosis:  Patient/family is realistic regarding therapy needs and expressed being hopeful for SNF placement. Patient expressed understanding of CSW role and discharge process as well as medical condition. No questions/concerns about plan or treatment.    Emotional Assessment Appearance:  Appears stated age Attitude/Demeanor/Rapport:  Gracious Affect (typically observed):  Accepting, Adaptable Orientation:  Oriented to Self, Oriented to Place, Oriented to  Time, Oriented to Situation Alcohol / Substance use:  Not Applicable Psych involvement (Current and /or in the community):  No (Comment)  Discharge Needs  Concerns to be addressed:  Discharge Planning Concerns Readmission within the last 30 days:  No Current discharge risk:  Dependent with Mobility Barriers to Discharge:  Continued Medical Work up   [employer], LCSW 08/15/2018, 3:17 PM

## 2018-08-15 NOTE — Care Management Obs Status (Signed)
[facility] NOTIFICATION   Patient Details  Name: Robin Christensen MRN: [id number] Date of Birth: [birth year]   Medicare Observation Status Notification Given:  Yes    Carles Collet, RN 08/15/2018, 9:49 AM

## 2018-08-16 DIAGNOSIS — E669 Obesity, unspecified: Secondary | ICD-10-CM | POA: Diagnosis not present

## 2018-08-16 DIAGNOSIS — I10 Essential (primary) hypertension: Secondary | ICD-10-CM | POA: Diagnosis not present

## 2018-08-16 DIAGNOSIS — E039 Hypothyroidism, unspecified: Secondary | ICD-10-CM | POA: Diagnosis not present

## 2018-08-16 DIAGNOSIS — Z6832 Body mass index (BMI) 32.0-32.9, adult: Secondary | ICD-10-CM | POA: Diagnosis not present

## 2018-08-16 DIAGNOSIS — E785 Hyperlipidemia, unspecified: Secondary | ICD-10-CM | POA: Diagnosis not present

## 2018-08-16 DIAGNOSIS — I4891 Unspecified atrial fibrillation: Secondary | ICD-10-CM | POA: Diagnosis not present

## 2018-08-16 DIAGNOSIS — Z853 Personal history of malignant neoplasm of breast: Secondary | ICD-10-CM | POA: Diagnosis not present

## 2018-08-16 DIAGNOSIS — G4730 Sleep apnea, unspecified: Secondary | ICD-10-CM | POA: Diagnosis not present

## 2018-08-16 DIAGNOSIS — M1712 Unilateral primary osteoarthritis, left knee: Secondary | ICD-10-CM | POA: Diagnosis not present

## 2018-08-16 LAB — CBC
HCT: 37.3 % (ref 36.0–46.0)
Hemoglobin: 12.1 g/dL (ref 12.0–15.0)
MCH: 29.7 pg (ref 26.0–34.0)
MCHC: 32.4 g/dL (ref 30.0–36.0)
MCV: 91.6 fL (ref 80.0–100.0)
Platelets: 165 10*3/uL (ref 150–400)
RBC: 4.07 MIL/uL (ref 3.87–5.11)
RDW: 12.9 % (ref 11.5–15.5)
WBC: 7.3 10*3/uL (ref 4.0–10.5)
nRBC: 0 % (ref 0.0–0.2)

## 2018-08-16 LAB — BASIC METABOLIC PANEL
Anion gap: 8 (ref 5–15)
BUN: 8 mg/dL (ref 8–23)
CO2: 26 mmol/L (ref 22–32)
Calcium: 8.4 mg/dL — ABNORMAL LOW (ref 8.9–10.3)
Chloride: 104 mmol/L (ref 98–111)
Creatinine, Ser: 0.8 mg/dL (ref 0.44–1.00)
GFR calc Af Amer: >60 mL/min (ref >60)
GFR calc non Af Amer: >60 mL/min (ref >60)
GLUCOSE: 124 mg/dL — AB (ref 70–99)
Potassium: 3.5 mmol/L (ref 3.5–5.1)
Sodium: 138 mmol/L (ref 135–145)

## 2018-08-16 MED ORDER — SODIUM CHLORIDE 0.9 % IV BOLUS
500.0000 mL | Freq: Once | INTRAVENOUS | Status: AC
  Administered 2018-08-16: 500 mL via INTRAVENOUS

## 2018-08-16 MED ORDER — HYDROCODONE-ACETAMINOPHEN 5-325 MG PO TABS: 1.0000 | ORAL_TABLET | ORAL | 0 refills | Status: DC | PRN

## 2018-08-16 MED ORDER — TIZANIDINE HCL 4 MG PO TABS: 4.0000 mg | ORAL_TABLET | Freq: Four times a day (QID) | ORAL | 1 refills | Status: DC | PRN

## 2018-08-16 NOTE — Progress Notes (Signed)
Subjective: 2 Days Post-Op Procedure(s) (LRB): TOTAL KNEE ARTHROPLASTY (Left)   Patient resting well. She became hypotensive with PT yesterday but since then has been well. She has mild pain. She is hoping to go to [facility] SNF today.  Activity level:  wbat Diet tolerance:  ok Voiding:  ok Patient reports pain as mild.    Objective: Vital signs in last 24 hours: Temp:  [97.6 F (36.4 C)-98.1 F (36.7 C)] 98.1 F (36.7 C) (01/30 0509) Pulse Rate:  [70-72] 72 (01/30 0509) Resp:  [16-18] 16 (01/30 0509) BP: (116-133)/(52-88) 133/59 (01/30 0509) SpO2:  [97 %-100 %] 97 % (01/30 0509)  Labs: No results for input(s): HGB in the last 72 hours. No results for input(s): WBC, RBC, HCT, PLT in the last 72 hours. No results for input(s): NA, K, CL, CO2, BUN, CREATININE, GLUCOSE, CALCIUM in the last 72 hours. No results for input(s): LABPT, INR in the last 72 hours.  Physical Exam:  Neurologically intact ABD soft Neurovascular intact Sensation intact distally Intact pulses distally Dorsiflexion/Plantar flexion intact Incision: dressing C/D/I and no drainage No cellulitis present Compartment soft  Assessment/Plan:  2 Days Post-Op Procedure(s) (LRB): TOTAL KNEE ARTHROPLASTY (Left) Advance diet Up with therapy Discharge to SNF [facility] today if doing well with PT and a bed is available. Continue on home eliquis  Follow up in office 2 weeks post op. Anticipated LOS equal to or greater than 2 midnights due to - Age 2 and older with one or more of the following:  - Obesity  - Expected need for hospital services (PT, OT, Nursing) required for safe  discharge  - Anticipated need for postoperative skilled nursing care or inpatient rehab  - Active co-morbidities: None OR   - Unanticipated findings during/Post Surgery: Slow post-op progression: GI, pain control, mobility  - Patient is a high risk of re-admission due to: Barriers to post-acute care (logistical, no family support  in home)  Robin Christensen Robin Christensen 08/16/2018, 6:42 AM

## 2018-08-16 NOTE — Progress Notes (Signed)
Physical Therapy Treatment Patient Details Name: Robin Christensen MRN: [id number] DOB: 10-May-1951 Today's Date: 08/16/2018    History of Present Illness Patient is 68 y/o female s/p L TKA. PMH includes HTN, dysrythmia, HLD, hx of R breast cancer, sleep apnea on CPAP, and hx of R TKA.     PT Comments    Mobility limited this session by orthostatic hypotension. See below for further details. Pt performed bed mobility min assist and sit to stand transfer with RW min assist. Pt able to stand statically with RW x 3 minutes min guard assist. Pt returned to bed and performed LLE exercises supine. PT to continue per POC.    Follow Up Recommendations  Follow surgeon's recommendation for DC plan and follow-up therapies     Equipment Recommendations  Other (comment)(TBD at next venue)    Recommendations for Other Services       Precautions / Restrictions Precautions Precautions: Knee Precaution Comments: reviewed no pillow under knee Restrictions LLE Weight Bearing: Weight bearing as tolerated    Mobility  Bed Mobility Overal bed mobility: Needs Assistance Bed Mobility: Supine to Sit;Sit to Supine     Supine to sit: Supervision;HOB elevated Sit to supine: Min assist;HOB elevated   General bed mobility comments: +rail, assist with LLE back into bed  Transfers Overall transfer level: Needs assistance Equipment used: Rolling walker (2 wheeled) Transfers: Sit to/from Stand Sit to Stand: Min assist         General transfer comment: cues for hand placement, assist to power up  Ambulation/Gait                 Stairs             Wheelchair Mobility    Modified Rankin (Stroke Patients Only)       Balance   Sitting-balance support: Feet supported;No upper extremity supported Sitting balance-Leahy Scale: Fair     Standing balance support: Bilateral upper extremity supported Standing balance-Leahy Scale: Poor Standing balance comment: reliant on BUE  support                            Cognition Arousal/Alertness: Awake/alert Behavior During Therapy: WFL for tasks assessed/performed Overall Cognitive Status: Within Functional Limits for tasks assessed                                        Exercises Total Joint Exercises Ankle Circles/Pumps: AROM;Both;20 reps;Supine Quad Sets: AROM;Left;10 reps;Supine Heel Slides: AROM;10 reps;Left;Supine Hip ABduction/ADduction: AROM;Left;10 reps;Supine Goniometric ROM: 0-80 degrees L knee    General Comments General comments (skin integrity, edema, etc.): orthostatic BP: supine 112/53, sitting 122/49, initial stand 99/55, standing after 3 minutes 81/69      Pertinent Vitals/Pain Pain Assessment: Faces Pain Score: 4  Pain Location: L knee Pain Descriptors / Indicators: Operative site guarding;Sore Pain Intervention(s): Monitored during session    Home Living                      Prior Function            PT Goals (current goals can now be found in the care plan section) Acute Rehab PT Goals Patient Stated Goal: feel better PT Goal Formulation: With patient Time For Goal Achievement: 08/28/18 Potential to Achieve Goals: Good Progress towards PT goals: Not progressing toward goals - comment(orthostatic)  Frequency    7X/week      PT Plan Current plan remains appropriate    Co-evaluation              AM-PAC PT "6 Clicks" Mobility   Outcome Measure  Help needed turning from your back to your side while in a flat bed without using bedrails?: None Help needed moving from lying on your back to sitting on the side of a flat bed without using bedrails?: A Little Help needed moving to and from a bed to a chair (including a wheelchair)?: A Little Help needed standing up from a chair using your arms (e.g., wheelchair or bedside chair)?: A Little Help needed to walk in hospital room?: A Little Help needed climbing 3-5 steps with a  railing? : A Lot 6 Click Score: 18    End of Session Equipment Utilized During Treatment: Gait belt Activity Tolerance: Treatment limited secondary to medical complications (Comment)(orthostatic hypotension) Patient left: in bed;with call bell/phone within reach;with nursing/sitter in room Nurse Communication: Mobility status;Other (comment)(orthostatic BP) PT Visit Diagnosis: Muscle weakness (generalized) (M62.81);Difficulty in walking, not elsewhere classified (R26.2);Pain Pain - Right/Left: Left Pain - part of body: Knee     Time: 3403-5248 PT Time Calculation (min) (ACUTE ONLY): 18 min  Charges:  $Therapeutic Exercise: 8-22 mins                     Lorrin Goodell, PT  Office # [phone] Pager [phone]    Lorriane Shire 08/16/2018, 10:08 AM

## 2018-08-16 NOTE — Progress Notes (Signed)
Physical Therapy Treatment Patient Details Name: Robin Christensen MRN: [id number] DOB: September 19, 1950 Today's Date: 08/16/2018    History of Present Illness Patient is 68 y/o female s/p L TKA. PMH includes HTN, dysrythmia, HLD, hx of R breast cancer, sleep apnea on CPAP, and hx of R TKA.     PT Comments    Mobility continues to be limited by orthostatic hypotension. Pt required min assist bed mobility, min guard assist sit to stand, and min guard assist +2 chair follow ambulation 25 feet x 3 with RW. Pt declining sitting up in recliner stating it is not comfortable and she is too long for it. Pt returned to supine in bed at end of session.    Follow Up Recommendations  Follow surgeon's recommendation for DC plan and follow-up therapies     Equipment Recommendations  Other (comment)(TBD at next venue)    Recommendations for Other Services       Precautions / Restrictions Precautions Precautions: Knee Precaution Comments: reviewed no pillow under knee Restrictions Weight Bearing Restrictions: Yes LLE Weight Bearing: Weight bearing as tolerated    Mobility  Bed Mobility Overal bed mobility: Needs Assistance Bed Mobility: Supine to Sit;Sit to Supine     Supine to sit: Supervision;HOB elevated Sit to supine: Min assist;HOB elevated   General bed mobility comments: +rail, assist with LLE back into bed  Transfers Overall transfer level: Needs assistance Equipment used: Rolling walker (2 wheeled) Transfers: Sit to/from Stand Sit to Stand: Min guard         General transfer comment: cues for hand placement, assist to power up  Ambulation/Gait Ambulation/Gait assistance: Min guard;+2 safety/equipment Gait Distance (Feet): 25 Feet(x 3) Assistive device: Rolling walker (2 wheeled) Gait Pattern/deviations: Decreased step length - right;Decreased step length - left;Trunk flexed Gait velocity: decreased Gait velocity interpretation: <1.31 ft/sec, indicative of household  ambulator General Gait Details: Pt requiring seated rest breaks after 25 feet due to feeling lightheaded.    Stairs             Wheelchair Mobility    Modified Rankin (Stroke Patients Only)       Balance   Sitting-balance support: Feet supported;No upper extremity supported Sitting balance-Leahy Scale: Fair     Standing balance support: Bilateral upper extremity supported Standing balance-Leahy Scale: Poor Standing balance comment: reliant on BUE support                            Cognition Arousal/Alertness: Awake/alert Behavior During Therapy: WFL for tasks assessed/performed Overall Cognitive Status: Within Functional Limits for tasks assessed                                        Exercises Total Joint Exercises Ankle Circles/Pumps: AROM;Both;20 reps;Supine Quad Sets: AROM;Left;10 reps;Supine Heel Slides: AROM;10 reps;Left;Supine Hip ABduction/ADduction: AROM;Left;10 reps;Supine Goniometric ROM: 0-80 degrees L knee    General Comments General comments (skin integrity, edema, etc.): orthostatic BP: supine 112/53, sitting 122/49, initial stand 99/55, standing after 3 minutes 81/69      Pertinent Vitals/Pain Pain Assessment: Faces Pain Score: 4  Faces Pain Scale: Hurts little more Pain Location: L knee Pain Descriptors / Indicators: Operative site guarding;Sore Pain Intervention(s): Monitored during session;Repositioned    Home Living  Prior Function            PT Goals (current goals can now be found in the care plan section) Acute Rehab PT Goals Patient Stated Goal: feel better PT Goal Formulation: With patient Time For Goal Achievement: 08/28/18 Potential to Achieve Goals: Good Progress towards PT goals: Progressing toward goals    Frequency    7X/week      PT Plan Current plan remains appropriate    Co-evaluation              AM-PAC PT "6 Clicks" Mobility   Outcome  Measure  Help needed turning from your back to your side while in a flat bed without using bedrails?: None Help needed moving from lying on your back to sitting on the side of a flat bed without using bedrails?: A Little Help needed moving to and from a bed to a chair (including a wheelchair)?: A Little Help needed standing up from a chair using your arms (e.g., wheelchair or bedside chair)?: A Little Help needed to walk in hospital room?: A Little Help needed climbing 3-5 steps with a railing? : A Lot 6 Click Score: 18    End of Session Equipment Utilized During Treatment: Gait belt Activity Tolerance: Treatment limited secondary to medical complications (Comment)(orthostatic) Patient left: in bed;with call bell/phone within reach Nurse Communication: Mobility status PT Visit Diagnosis: Muscle weakness (generalized) (M62.81);Difficulty in walking, not elsewhere classified (R26.2);Pain Pain - Right/Left: Left Pain - part of body: Knee     Time: 4782-9562 PT Time Calculation (min) (ACUTE ONLY): 16 min  Charges:  $Gait Training: 8-22 mins $Therapeutic Exercise: 8-22 mins                     Lorrin Goodell, PT  Office # [phone] Pager [phone]    Lorriane Shire 08/16/2018, 11:57 AM

## 2018-08-16 NOTE — Discharge Summary (Deleted)
Patient ID: Robin Christensen MRN: [id number] DOB/AGE: 1951-02-13 68 y.o.  Admit date: 08/14/2018 Discharge date: 08/16/2018  Admission Diagnoses:  Principal Problem:   Primary osteoarthritis of left knee   Discharge Diagnoses:  Same  Past Medical History:  Diagnosis Date  . Cancer [facility])    Right Breast Cancer  . Dysrhythmia    AF  . Hyperlipidemia   . Hypertension   . Hypothyroidism   . Obesity   . Pneumonia   . PONV (postoperative nausea and vomiting)   . Sleep apnea    cpcap    Surgeries: Procedure(s): TOTAL KNEE ARTHROPLASTY on 08/14/2018   Consultants:   Discharged Condition: Improved  Hospital Course: Robin Christensen is an 68 y.o. female who was admitted 08/14/2018 for operative treatment ofPrimary osteoarthritis of left knee. Patient has severe unremitting pain that affects sleep, daily activities, and work/hobbies. After pre-op clearance the patient was taken to the operating room on 08/14/2018 and underwent  Procedure(s): TOTAL KNEE ARTHROPLASTY.    Patient was given perioperative antibiotics:  Anti-infectives (From admission, onward)   Start     Dose/Rate Route Frequency Ordered Stop   08/14/18 2100  ceFAZolin (ANCEF) IVPB 2g/100 mL premix     2 g 200 mL/hr over 30 Minutes Intravenous Every 6 hours 08/14/18 1634 08/15/18 0329   08/14/18 1505  ceFAZolin (ANCEF) 2-4 GM/100ML-% IVPB    Note to Pharmacy:  Carney Living   : cabinet override      08/14/18 1505 08/14/18 1524   08/14/18 0815  ceFAZolin (ANCEF) IVPB 2g/100 mL premix     2 g 200 mL/hr over 30 Minutes Intravenous To Surgery 08/13/18 0852 08/14/18 0930       Patient was given sequential compression devices, early ambulation, and chemoprophylaxis to prevent DVT.  Patient benefited maximally from hospital stay and there were no complications.    Recent vital signs:  Patient Vitals for the past 24 hrs:  BP Temp Temp src Pulse Resp SpO2 Height  08/16/18 0509 (!) 133/59 98.1 F (36.7 C) Oral  72 16 97 % -  08/15/18 1935 (!) 120/52 98 F (36.7 C) Oral 70 16 98 % -  08/15/18 1347 116/88 97.6 F (36.4 C) Oral 71 18 100 % -  08/15/18 1100 - - - - - 100 % 5\' 8"  (1.727 m)     Recent laboratory studies: No results for input(s): WBC, HGB, HCT, PLT, NA, K, CL, CO2, BUN, CREATININE, GLUCOSE, INR, CALCIUM in the last 72 hours.  Invalid input(s): PT, 2   Discharge Medications:   Allergies as of 08/16/2018      Reactions   Xarelto [rivaroxaban] Other (See Comments)   JOINT ACHES/PAIN      Medication List    TAKE these medications   acetaminophen 500 MG tablet Commonly known as:  TYLENOL Take 500 mg by mouth 3 (three) times daily as needed for mild pain or headache.   CALCIUM-VITAMIN D PO Take 2 tablets by mouth every evening.   ELIQUIS 5 MG Tabs tablet Generic drug:  apixaban Take 1 tablet (5 mg total) by mouth 2 (two) times daily.   furosemide 40 MG tablet Commonly known as:  LASIX Take 40 mg by mouth daily.   HYDROcodone-acetaminophen 5-325 MG tablet Commonly known as:  NORCO/VICODIN Take 1-2 tablets by mouth every 4 (four) hours as needed for moderate pain (pain score 4-6).   levothyroxine 75 MCG tablet Commonly known as:  SYNTHROID, LEVOTHROID Take 75 mcg by mouth daily before  breakfast.   potassium chloride SA 20 MEQ tablet Commonly known as:  K-DUR,KLOR-CON Take 20 mEq by mouth daily.   pravastatin 40 MG tablet Commonly known as:  PRAVACHOL Take 40 mg by mouth daily.   tiZANidine 4 MG tablet Commonly known as:  ZANAFLEX Take 1 tablet (4 mg total) by mouth every 6 (six) hours as needed.   zonisamide 50 MG capsule Commonly known as:  ZONEGRAN Take 1 capsule (50 mg total) by mouth at bedtime.            Durable Medical Equipment  (From admission, onward)         Start     Ordered   08/14/18 1635  DME Walker rolling  Once    Question:  Patient needs a walker to treat with the following condition  Answer:  Primary osteoarthritis of left knee    08/14/18 1634   08/14/18 1635  DME 3 n 1  Once     08/14/18 1634   08/14/18 1635  DME Bedside commode  Once    Question:  Patient needs a bedside commode to treat with the following condition  Answer:  Primary osteoarthritis of left knee   08/14/18 1634          Diagnostic Studies: Dg Chest 2 View  Result Date: 08/07/2018 CLINICAL DATA:  Preoperative evaluation. EXAM: CHEST - 2 VIEW COMPARISON:  04/22/2016. FINDINGS: Mediastinum hilar structures normal. Lungs are clear. No pleural effusion or pneumothorax. Heart size normal. Surgical clips noted over the right chest. Degenerative change thoracic spine IMPRESSION: No acute cardiopulmonary disease. Electronically Signed   By: Marcello Moores  Register   On: 08/07/2018 14:27    Disposition: Discharge disposition: 03-[facility] [place]       Discharge Instructions    Call MD / Call 911   Complete by:  As directed    If you experience chest pain or shortness of breath, CALL 911 and be transported to the hospital emergency room.  If you develope a fever above 101 F, pus (white drainage) or increased drainage or redness at the wound, or calf pain, call your surgeon's office.   Constipation Prevention   Complete by:  As directed    Drink plenty of fluids.  Prune juice may be helpful.  You may use a stool softener, such as Colace (over the counter) 100 mg twice a day.  Use MiraLax (over the counter) for constipation as needed.   Diet - low sodium heart healthy   Complete by:  As directed    Discharge instructions   Complete by:  As directed    INSTRUCTIONS AFTER JOINT REPLACEMENT   Remove items at home which could result in a fall. This includes throw rugs or furniture in walking pathways ICE to the affected joint every three hours while awake for 30 minutes at a time, for at least the first 3-5 days, and then as needed for pain and swelling.  Continue to use ice for pain and swelling. You may notice swelling that will progress down to the  foot and ankle.  This is normal after surgery.  Elevate your leg when you are not up walking on it.   Continue to use the breathing machine you got in the hospital (incentive spirometer) which will help keep your temperature down.  It is common for your temperature to cycle up and down following surgery, especially at night when you are not up moving around and exerting yourself.  The breathing machine keeps your  lungs expanded and your temperature down.   DIET:  As you were doing prior to hospitalization, we recommend a well-balanced diet.  DRESSING / WOUND CARE / SHOWERING  You may shower 3 days after surgery, but keep the wounds dry during showering.  You may use an occlusive plastic wrap (Press'n Seal for example), NO SOAKING/SUBMERGING IN THE BATHTUB.  If the bandage gets wet, change with a clean dry gauze.  If the incision gets wet, pat the wound dry with a clean towel.  ACTIVITY  Increase activity slowly as tolerated, but follow the weight bearing instructions below.   No driving for 6 weeks or until further direction given by your physician.  You cannot drive while taking narcotics.  No lifting or carrying greater than 10 lbs. until further directed by your surgeon. Avoid periods of inactivity such as sitting longer than an hour when not asleep. This helps prevent blood clots.  You may return to work once you are authorized by your doctor.     WEIGHT BEARING   Weight bearing as tolerated with assist device (walker, cane, etc) as directed, use it as long as suggested by your surgeon or therapist, typically at least 4-6 weeks.   EXERCISES  Results after joint replacement surgery are often greatly improved when you follow the exercise, range of motion and muscle strengthening exercises prescribed by your doctor. Safety measures are also important to protect the joint from further injury. Any time any of these exercises cause you to have increased pain or swelling, decrease what you  are doing until you are comfortable again and then slowly increase them. If you have problems or questions, call your caregiver or physical therapist for advice.   Rehabilitation is important following a joint replacement. After just a few days of immobilization, the muscles of the leg can become weakened and shrink (atrophy).  These exercises are designed to build up the tone and strength of the thigh and leg muscles and to improve motion. Often times heat used for twenty to thirty minutes before working out will loosen up your tissues and help with improving the range of motion but do not use heat for the first two weeks following surgery (sometimes heat can increase post-operative swelling).   These exercises can be done on a training (exercise) mat, on the floor, on a table or on a bed. Use whatever works the best and is most comfortable for you.    Use music or television while you are exercising so that the exercises are a pleasant break in your day. This will make your life better with the exercises acting as a break in your routine that you can look forward to.   Perform all exercises about fifteen times, three times per day or as directed.  You should exercise both the operative leg and the other leg as well.   Exercises include:   Quad Sets - Tighten up the muscle on the front of the thigh (Quad) and hold for 5-10 seconds.   Straight Leg Raises - With your knee straight (if you were given a brace, keep it on), lift the leg to 60 degrees, hold for 3 seconds, and slowly lower the leg.  Perform this exercise against resistance later as your leg gets stronger.  Leg Slides: Lying on your back, slowly slide your foot toward your buttocks, bending your knee up off the floor (only go as far as is comfortable). Then slowly slide your foot back down until your leg is  flat on the floor again.  Angel Wings: Lying on your back spread your legs to the side as far apart as you can without causing discomfort.   Hamstring Strength:  Lying on your back, push your heel against the floor with your leg straight by tightening up the muscles of your buttocks.  Repeat, but this time bend your knee to a comfortable angle, and push your heel against the floor.  You may put a pillow under the heel to make it more comfortable if necessary.   A rehabilitation program following joint replacement surgery can speed recovery and prevent re-injury in the future due to weakened muscles. Contact your doctor or a physical therapist for more information on knee rehabilitation.    CONSTIPATION  Constipation is defined medically as fewer than three stools per week and severe constipation as less than one stool per week.  Even if you have a regular bowel pattern at home, your normal regimen is likely to be disrupted due to multiple reasons following surgery.  Combination of anesthesia, postoperative narcotics, change in appetite and fluid intake all can affect your bowels.   YOU MUST use at least one of the following options; they are listed in order of increasing strength to get the job done.  They are all available over the counter, and you may need to use some, POSSIBLY even all of these options:    Drink plenty of fluids (prune juice may be helpful) and high fiber foods Colace 100 mg by mouth twice a day  Senokot for constipation as directed and as needed Dulcolax (bisacodyl), take with full glass of water  Miralax (polyethylene glycol) once or twice a day as needed.  If you have tried all these things and are unable to have a bowel movement in the first 3-4 days after surgery call either your surgeon or your primary doctor.    If you experience loose stools or diarrhea, hold the medications until you stool forms back up.  If your symptoms do not get better within 1 week or if they get worse, check with your doctor.  If you experience "the worst abdominal pain ever" or develop nausea or vomiting, please contact the office  immediately for further recommendations for treatment.   ITCHING:  If you experience itching with your medications, try taking only a single pain pill, or even half a pain pill at a time.  You can also use Benadryl over the counter for itching or also to help with sleep.   TED HOSE STOCKINGS:  Use stockings on both legs until for at least 2 weeks or as directed by physician office. They may be removed at night for sleeping.  MEDICATIONS:  See your medication summary on the "After Visit Summary" that nursing will review with you.  You may have some home medications which will be placed on hold until you complete the course of blood thinner medication.  It is important for you to complete the blood thinner medication as prescribed.  PRECAUTIONS:  If you experience chest pain or shortness of breath - call 911 immediately for transfer to the hospital emergency department.   If you develop a fever greater that 101 F, purulent drainage from wound, increased redness or drainage from wound, foul odor from the wound/dressing, or calf pain - CONTACT YOUR SURGEON.  FOLLOW-UP APPOINTMENTS:  If you do not already have a post-op appointment, please call the office for an appointment to be seen by your surgeon.  Guidelines for how soon to be seen are listed in your "After Visit Summary", but are typically between 1-4 weeks after surgery.  OTHER INSTRUCTIONS:   Knee Replacement:  Do not place pillow under knee, focus on keeping the knee straight while resting. CPM instructions: 0-90 degrees, 2 hours in the morning, 2 hours in the afternoon, and 2 hours in the evening. Place foam block, curve side up under heel at all times except when in CPM or when walking.  DO NOT modify, tear, cut, or change the foam block in any way.  MAKE SURE YOU:  Understand these instructions.  Get help right away if you are not doing well or get worse.    Thank you for letting us be a  part of your medical care team.  It is a privilege we respect greatly.  We hope these instructions will help you stay on track for a fast and full recovery!   Increase activity slowly as tolerated   Complete by:  As directed       Follow-up Information    Melrose Nakayama, MD. Schedule an appointment as soon as possible for a visit in 2 weeks.   Specialty:  Orthopedic Surgery Contact information: [place] [address] [phone]            Signed: Larwance Sachs Benaiah Behan 08/16/2018, 6:46 AM

## 2018-08-16 NOTE — Progress Notes (Signed)
Spoke with PA regarding orthostatic hypotension. New orders received. Pt c/o some pain rated "5 or 6" on 10 scale. Medicated as needed. Continue to monitor.

## 2018-08-17 DIAGNOSIS — M2550 Pain in unspecified joint: Secondary | ICD-10-CM | POA: Diagnosis not present

## 2018-08-17 DIAGNOSIS — Z853 Personal history of malignant neoplasm of breast: Secondary | ICD-10-CM | POA: Diagnosis not present

## 2018-08-17 DIAGNOSIS — I4891 Unspecified atrial fibrillation: Secondary | ICD-10-CM | POA: Diagnosis not present

## 2018-08-17 DIAGNOSIS — R278 Other lack of coordination: Secondary | ICD-10-CM | POA: Diagnosis not present

## 2018-08-17 DIAGNOSIS — E7849 Other hyperlipidemia: Secondary | ICD-10-CM | POA: Diagnosis not present

## 2018-08-17 DIAGNOSIS — E785 Hyperlipidemia, unspecified: Secondary | ICD-10-CM | POA: Diagnosis not present

## 2018-08-17 DIAGNOSIS — R2689 Other abnormalities of gait and mobility: Secondary | ICD-10-CM | POA: Diagnosis not present

## 2018-08-17 DIAGNOSIS — I959 Hypotension, unspecified: Secondary | ICD-10-CM | POA: Diagnosis not present

## 2018-08-17 DIAGNOSIS — I480 Paroxysmal atrial fibrillation: Secondary | ICD-10-CM | POA: Diagnosis not present

## 2018-08-17 DIAGNOSIS — E039 Hypothyroidism, unspecified: Secondary | ICD-10-CM | POA: Diagnosis not present

## 2018-08-17 DIAGNOSIS — G4730 Sleep apnea, unspecified: Secondary | ICD-10-CM | POA: Diagnosis not present

## 2018-08-17 DIAGNOSIS — E669 Obesity, unspecified: Secondary | ICD-10-CM | POA: Diagnosis not present

## 2018-08-17 DIAGNOSIS — M1990 Unspecified osteoarthritis, unspecified site: Secondary | ICD-10-CM | POA: Diagnosis not present

## 2018-08-17 DIAGNOSIS — Z7401 Bed confinement status: Secondary | ICD-10-CM | POA: Diagnosis not present

## 2018-08-17 DIAGNOSIS — C50919 Malignant neoplasm of unspecified site of unspecified female breast: Secondary | ICD-10-CM | POA: Diagnosis not present

## 2018-08-17 DIAGNOSIS — G4733 Obstructive sleep apnea (adult) (pediatric): Secondary | ICD-10-CM | POA: Diagnosis not present

## 2018-08-17 DIAGNOSIS — M1712 Unilateral primary osteoarthritis, left knee: Secondary | ICD-10-CM | POA: Diagnosis not present

## 2018-08-17 DIAGNOSIS — Z6832 Body mass index (BMI) 32.0-32.9, adult: Secondary | ICD-10-CM | POA: Diagnosis not present

## 2018-08-17 DIAGNOSIS — Z96659 Presence of unspecified artificial knee joint: Secondary | ICD-10-CM | POA: Diagnosis not present

## 2018-08-17 DIAGNOSIS — L859 Epidermal thickening, unspecified: Secondary | ICD-10-CM | POA: Diagnosis not present

## 2018-08-17 DIAGNOSIS — I10 Essential (primary) hypertension: Secondary | ICD-10-CM | POA: Diagnosis not present

## 2018-08-17 DIAGNOSIS — Z471 Aftercare following joint replacement surgery: Secondary | ICD-10-CM | POA: Diagnosis not present

## 2018-08-17 DIAGNOSIS — M1711 Unilateral primary osteoarthritis, right knee: Secondary | ICD-10-CM | POA: Diagnosis not present

## 2018-08-17 NOTE — Discharge Summary (Signed)
Patient ID: Robin Christensen MRN: [id number] DOB/AGE: 04/13/1951 68 y.o.  Admit date: 08/14/2018 Discharge date: 08/17/2018  Admission Diagnoses:  Principal Problem:   Primary osteoarthritis of left knee   Discharge Diagnoses:  Same  Past Medical History:  Diagnosis Date  . Cancer [facility])    Right Breast Cancer  . Dysrhythmia    AF  . Hyperlipidemia   . Hypertension   . Hypothyroidism   . Obesity   . Pneumonia   . PONV (postoperative nausea and vomiting)   . Sleep apnea    cpcap    Surgeries: Procedure(s): TOTAL KNEE ARTHROPLASTY on 08/14/2018   Consultants:   Discharged Condition: Improved  Hospital Course: Robin Christensen is an 68 y.o. female who was admitted 08/14/2018 for operative treatment ofPrimary osteoarthritis of left knee. Patient has severe unremitting pain that affects sleep, daily activities, and work/hobbies. After pre-op clearance the patient was taken to the operating room on 08/14/2018 and underwent  Procedure(s): TOTAL KNEE ARTHROPLASTY.    Patient was given perioperative antibiotics:  Anti-infectives (From admission, onward)   Start     Dose/Rate Route Frequency Ordered Stop   08/14/18 2100  ceFAZolin (ANCEF) IVPB 2g/100 mL premix     2 g 200 mL/hr over 30 Minutes Intravenous Every 6 hours 08/14/18 1634 08/15/18 0329   08/14/18 1505  ceFAZolin (ANCEF) 2-4 GM/100ML-% IVPB    Note to Pharmacy:  Carney Living   : cabinet override      08/14/18 1505 08/14/18 1524   08/14/18 0815  ceFAZolin (ANCEF) IVPB 2g/100 mL premix     2 g 200 mL/hr over 30 Minutes Intravenous To Surgery 08/13/18 0852 08/14/18 0930       Patient was given sequential compression devices, early ambulation, and chemoprophylaxis to prevent DVT.  Patient benefited maximally from hospital stay and there were no complications.    Recent vital signs:  Patient Vitals for the past 24 hrs:  BP Temp Temp src Pulse Resp SpO2  08/17/18 0508 (!) 133/58 98 F (36.7 C) Oral 64 - 99 %   08/16/18 1951 (!) 103/45 99.1 F (37.3 C) Oral 76 16 97 %  08/16/18 1426 (!) 115/49 99.6 F (37.6 C) Oral 84 16 98 %  08/16/18 1032 (!) 128/55 98.6 F (37 C) Oral 66 16 98 %     Recent laboratory studies:  Recent Labs    08/16/18 1133  WBC 7.3  HGB 12.1  HCT 37.3  PLT 165  NA 138  K 3.5  CL 104  CO2 26  BUN 8  CREATININE 0.80  GLUCOSE 124*  CALCIUM 8.4*     Discharge Medications:   Allergies as of 08/17/2018      Reactions   Xarelto [rivaroxaban] Other (See Comments)   JOINT ACHES/PAIN      Medication List    TAKE these medications   acetaminophen 500 MG tablet Commonly known as:  TYLENOL Take 500 mg by mouth 3 (three) times daily as needed for mild pain or headache.   CALCIUM-VITAMIN D PO Take 2 tablets by mouth every evening.   ELIQUIS 5 MG Tabs tablet Generic drug:  apixaban Take 1 tablet (5 mg total) by mouth 2 (two) times daily.   furosemide 40 MG tablet Commonly known as:  LASIX Take 40 mg by mouth daily.   HYDROcodone-acetaminophen 5-325 MG tablet Commonly known as:  NORCO/VICODIN Take 1-2 tablets by mouth every 4 (four) hours as needed for moderate pain (pain score 4-6).   levothyroxine  75 MCG tablet Commonly known as:  SYNTHROID, LEVOTHROID Take 75 mcg by mouth daily before breakfast.   potassium chloride SA 20 MEQ tablet Commonly known as:  K-DUR,KLOR-CON Take 20 mEq by mouth daily.   pravastatin 40 MG tablet Commonly known as:  PRAVACHOL Take 40 mg by mouth daily.   tiZANidine 4 MG tablet Commonly known as:  ZANAFLEX Take 1 tablet (4 mg total) by mouth every 6 (six) hours as needed.   zonisamide 50 MG capsule Commonly known as:  ZONEGRAN Take 1 capsule (50 mg total) by mouth at bedtime.            Durable Medical Equipment  (From admission, onward)         Start     Ordered   08/14/18 1635  DME Walker rolling  Once    Question:  Patient needs a walker to treat with the following condition  Answer:  Primary  osteoarthritis of left knee   08/14/18 1634   08/14/18 1635  DME 3 n 1  Once     08/14/18 1634   08/14/18 1635  DME Bedside commode  Once    Question:  Patient needs a bedside commode to treat with the following condition  Answer:  Primary osteoarthritis of left knee   08/14/18 1634          Diagnostic Studies: Dg Chest 2 View  Result Date: 08/07/2018 CLINICAL DATA:  Preoperative evaluation. EXAM: CHEST - 2 VIEW COMPARISON:  04/22/2016. FINDINGS: Mediastinum hilar structures normal. Lungs are clear. No pleural effusion or pneumothorax. Heart size normal. Surgical clips noted over the right chest. Degenerative change thoracic spine IMPRESSION: No acute cardiopulmonary disease. Electronically Signed   By: Marcello Moores  Register   On: 08/07/2018 14:27    Disposition: Discharge disposition: 03-Skilled [place]       Discharge Instructions    Call MD / Call 911   Complete by:  As directed    If you experience chest pain or shortness of breath, CALL 911 and be transported to the hospital emergency room.  If you develope a fever above 101 F, pus (white drainage) or increased drainage or redness at the wound, or calf pain, call your surgeon's office.   Call MD / Call 911   Complete by:  As directed    If you experience chest pain or shortness of breath, CALL 911 and be transported to the hospital emergency room.  If you develope a fever above 101 F, pus (white drainage) or increased drainage or redness at the wound, or calf pain, call your surgeon's office.   Constipation Prevention   Complete by:  As directed    Drink plenty of fluids.  Prune juice may be helpful.  You may use a stool softener, such as Colace (over the counter) 100 mg twice a day.  Use MiraLax (over the counter) for constipation as needed.   Constipation Prevention   Complete by:  As directed    Drink plenty of fluids.  Prune juice may be helpful.  You may use a stool softener, such as Colace (over the counter) 100 mg  twice a day.  Use MiraLax (over the counter) for constipation as needed.   Diet - low sodium heart healthy   Complete by:  As directed    Diet - low sodium heart healthy   Complete by:  As directed    Discharge instructions   Complete by:  As directed    [place]  Remove items at home which could result in a fall. This includes throw rugs or furniture in walking pathways ICE to the affected joint every three hours while awake for 30 minutes at a time, for at least the first 3-5 days, and then as needed for pain and swelling.  Continue to use ice for pain and swelling. You may notice swelling that will progress down to the foot and ankle.  This is normal after surgery.  Elevate your leg when you are not up walking on it.   Continue to use the breathing machine you got in the hospital (incentive spirometer) which will help keep your temperature down.  It is common for your temperature to cycle up and down following surgery, especially at night when you are not up moving around and exerting yourself.  The breathing machine keeps your lungs expanded and your temperature down.   DIET:  As you were doing prior to hospitalization, we recommend a well-balanced diet.  DRESSING / WOUND CARE / SHOWERING  You may shower 3 days after surgery, but keep the wounds dry during showering.  You may use an occlusive plastic wrap (Press'n Seal for example), NO SOAKING/SUBMERGING IN THE BATHTUB.  If the bandage gets wet, change with a clean dry gauze.  If the incision gets wet, pat the wound dry with a clean towel.  ACTIVITY  Increase activity slowly as tolerated, but follow the weight bearing instructions below.   No driving for 6 weeks or until further direction given by your physician.  You cannot drive while taking narcotics.  No lifting or carrying greater than 10 lbs. until further directed by your surgeon. Avoid periods of inactivity such as sitting longer than an hour when not  asleep. This helps prevent blood clots.  You may return to work once you are authorized by your doctor.     WEIGHT BEARING   Weight bearing as tolerated with assist device (walker, cane, etc) as directed, use it as long as suggested by your surgeon or therapist, typically at least 4-6 weeks.   EXERCISES  Results after joint replacement surgery are often greatly improved when you follow the exercise, range of motion and muscle strengthening exercises prescribed by your doctor. Safety measures are also important to protect the joint from further injury. Any time any of these exercises cause you to have increased pain or swelling, decrease what you are doing until you are comfortable again and then slowly increase them. If you have problems or questions, call your caregiver or physical therapist for advice.   Rehabilitation is important following a joint replacement. After just a few days of immobilization, the muscles of the leg can become weakened and shrink (atrophy).  These exercises are designed to build up the tone and strength of the thigh and leg muscles and to improve motion. Often times heat used for twenty to thirty minutes before working out will loosen up your tissues and help with improving the range of motion but do not use heat for the first two weeks following surgery (sometimes heat can increase post-operative swelling).   These exercises can be done on a training (exercise) mat, on the floor, on a table or on a bed. Use whatever works the best and is most comfortable for you.    Use music or television while you are exercising so that the exercises are a pleasant break in your day. This will make your life better with the exercises acting as a break in your routine  that you can look forward to.   Perform all exercises about fifteen times, three times per day or as directed.  You should exercise both the operative leg and the other leg as well.   Exercises include:   Quad Sets -  Tighten up the muscle on the front of the thigh (Quad) and hold for 5-10 seconds.   Straight Leg Raises - With your knee straight (if you were given a brace, keep it on), lift the leg to 60 degrees, hold for 3 seconds, and slowly lower the leg.  Perform this exercise against resistance later as your leg gets stronger.  Leg Slides: Lying on your back, slowly slide your foot toward your buttocks, bending your knee up off the floor (only go as far as is comfortable). Then slowly slide your foot back down until your leg is flat on the floor again.  Angel Wings: Lying on your back spread your legs to the side as far apart as you can without causing discomfort.  Hamstring Strength:  Lying on your back, push your heel against the floor with your leg straight by tightening up the muscles of your buttocks.  Repeat, but this time bend your knee to a comfortable angle, and push your heel against the floor.  You may put a pillow under the heel to make it more comfortable if necessary.   A rehabilitation program following joint replacement surgery can speed recovery and prevent re-injury in the future due to weakened muscles. Contact your doctor or a physical therapist for more information on knee rehabilitation.    CONSTIPATION  Constipation is defined medically as fewer than three stools per week and severe constipation as less than one stool per week.  Even if you have a regular bowel pattern at home, your normal regimen is likely to be disrupted due to multiple reasons following surgery.  Combination of anesthesia, postoperative narcotics, change in appetite and fluid intake all can affect your bowels.   YOU MUST use at least one of the following options; they are listed in order of increasing strength to get the job done.  They are all available over the counter, and you may need to use some, POSSIBLY even all of these options:    Drink plenty of fluids (prune juice may be helpful) and high fiber  foods Colace 100 mg by mouth twice a day  Senokot for constipation as directed and as needed Dulcolax (bisacodyl), take with full glass of water  Miralax (polyethylene glycol) once or twice a day as needed.  If you have tried all these things and are unable to have a bowel movement in the first 3-4 days after surgery call either your surgeon or your primary doctor.    If you experience loose stools or diarrhea, hold the medications until you stool forms back up.  If your symptoms do not get better within 1 week or if they get worse, check with your doctor.  If you experience "the worst abdominal pain ever" or develop nausea or vomiting, please contact the office immediately for further recommendations for treatment.   ITCHING:  If you experience itching with your medications, try taking only a single pain pill, or even half a pain pill at a time.  You can also use Benadryl over the counter for itching or also to help with sleep.   TED HOSE STOCKINGS:  Use stockings on both legs until for at least 2 weeks or as directed by physician office. They may be  removed at night for sleeping.  MEDICATIONS:  See your medication summary on the "After Visit Summary" that nursing will review with you.  You may have some home medications which will be placed on hold until you complete the course of blood thinner medication.  It is important for you to complete the blood thinner medication as prescribed.  PRECAUTIONS:  If you experience chest pain or shortness of breath - call 911 immediately for transfer to the hospital emergency department.   If you develop a fever greater that 101 F, purulent drainage from wound, increased redness or drainage from wound, foul odor from the wound/dressing, or calf pain - CONTACT YOUR SURGEON.                                                   FOLLOW-UP APPOINTMENTS:  If you do not already have a post-op appointment, please call the office for an appointment to be seen by your  surgeon.  Guidelines for how soon to be seen are listed in your "After Visit Summary", but are typically between 1-4 weeks after surgery.  OTHER INSTRUCTIONS:   Knee Replacement:  Do not place pillow under knee, focus on keeping the knee straight while resting. CPM instructions: 0-90 degrees, 2 hours in the morning, 2 hours in the afternoon, and 2 hours in the evening. Place foam block, curve side up under heel at all times except when in CPM or when walking.  DO NOT modify, tear, cut, or change the foam block in any way.  MAKE SURE YOU:  Understand these instructions.  Get help right away if you are not doing well or get worse.    Thank you for letting us be a part of your medical care team.  It is a privilege we respect greatly.  We hope these instructions will help you stay on track for a fast and full recovery!   Discharge instructions   Complete by:  As directed    INSTRUCTIONS AFTER JOINT REPLACEMENT   Remove items at home which could result in a fall. This includes throw rugs or furniture in walking pathways ICE to the affected joint every three hours while awake for 30 minutes at a time, for at least the first 3-5 days, and then as needed for pain and swelling.  Continue to use ice for pain and swelling. You may notice swelling that will progress down to the foot and ankle.  This is normal after surgery.  Elevate your leg when you are not up walking on it.   Continue to use the breathing machine you got in the hospital (incentive spirometer) which will help keep your temperature down.  It is common for your temperature to cycle up and down following surgery, especially at night when you are not up moving around and exerting yourself.  The breathing machine keeps your lungs expanded and your temperature down.   DIET:  As you were doing prior to hospitalization, we recommend a well-balanced diet.  DRESSING / WOUND CARE / SHOWERING  You may shower 3 days after surgery, but keep the  wounds dry during showering.  You may use an occlusive plastic wrap (Press'n Seal for example), NO SOAKING/SUBMERGING IN THE BATHTUB.  If the bandage gets wet, change with a clean dry gauze.  If the incision gets wet, pat the wound dry  with a clean towel.  ACTIVITY  Increase activity slowly as tolerated, but follow the weight bearing instructions below.   No driving for 6 weeks or until further direction given by your physician.  You cannot drive while taking narcotics.  No lifting or carrying greater than 10 lbs. until further directed by your surgeon. Avoid periods of inactivity such as sitting longer than an hour when not asleep. This helps prevent blood clots.  You may return to work once you are authorized by your doctor.     WEIGHT BEARING   Weight bearing as tolerated with assist device (walker, cane, etc) as directed, use it as long as suggested by your surgeon or therapist, typically at least 4-6 weeks.   EXERCISES  Results after joint replacement surgery are often greatly improved when you follow the exercise, range of motion and muscle strengthening exercises prescribed by your doctor. Safety measures are also important to protect the joint from further injury. Any time any of these exercises cause you to have increased pain or swelling, decrease what you are doing until you are comfortable again and then slowly increase them. If you have problems or questions, call your caregiver or physical therapist for advice.   Rehabilitation is important following a joint replacement. After just a few days of immobilization, the muscles of the leg can become weakened and shrink (atrophy).  These exercises are designed to build up the tone and strength of the thigh and leg muscles and to improve motion. Often times heat used for twenty to thirty minutes before working out will loosen up your tissues and help with improving the range of motion but do not use heat for the first two weeks following  surgery (sometimes heat can increase post-operative swelling).   These exercises can be done on a training (exercise) mat, on the floor, on a table or on a bed. Use whatever works the best and is most comfortable for you.    Use music or television while you are exercising so that the exercises are a pleasant break in your day. This will make your life better with the exercises acting as a break in your routine that you can look forward to.   Perform all exercises about fifteen times, three times per day or as directed.  You should exercise both the operative leg and the other leg as well.   Exercises include:   Quad Sets - Tighten up the muscle on the front of the thigh (Quad) and hold for 5-10 seconds.   Straight Leg Raises - With your knee straight (if you were given a brace, keep it on), lift the leg to 60 degrees, hold for 3 seconds, and slowly lower the leg.  Perform this exercise against resistance later as your leg gets stronger.  Leg Slides: Lying on your back, slowly slide your foot toward your buttocks, bending your knee up off the floor (only go as far as is comfortable). Then slowly slide your foot back down until your leg is flat on the floor again.  Angel Wings: Lying on your back spread your legs to the side as far apart as you can without causing discomfort.  Hamstring Strength:  Lying on your back, push your heel against the floor with your leg straight by tightening up the muscles of your buttocks.  Repeat, but this time bend your knee to a comfortable angle, and push your heel against the floor.  You may put a pillow under the heel to make  it more comfortable if necessary.   A rehabilitation program following joint replacement surgery can speed recovery and prevent re-injury in the future due to weakened muscles. Contact your doctor or a physical therapist for more information on knee rehabilitation.    CONSTIPATION  Constipation is defined medically as fewer than three stools  per week and severe constipation as less than one stool per week.  Even if you have a regular bowel pattern at home, your normal regimen is likely to be disrupted due to multiple reasons following surgery.  Combination of anesthesia, postoperative narcotics, change in appetite and fluid intake all can affect your bowels.   YOU MUST use at least one of the following options; they are listed in order of increasing strength to get the job done.  They are all available over the counter, and you may need to use some, POSSIBLY even all of these options:    Drink plenty of fluids (prune juice may be helpful) and high fiber foods Colace 100 mg by mouth twice a day  Senokot for constipation as directed and as needed Dulcolax (bisacodyl), take with full glass of water  Miralax (polyethylene glycol) once or twice a day as needed.  If you have tried all these things and are unable to have a bowel movement in the first 3-4 days after surgery call either your surgeon or your primary doctor.    If you experience loose stools or diarrhea, hold the medications until you stool forms back up.  If your symptoms do not get better within 1 week or if they get worse, check with your doctor.  If you experience "the worst abdominal pain ever" or develop nausea or vomiting, please contact the office immediately for further recommendations for treatment.   ITCHING:  If you experience itching with your medications, try taking only a single pain pill, or even half a pain pill at a time.  You can also use Benadryl over the counter for itching or also to help with sleep.   TED HOSE STOCKINGS:  Use stockings on both legs until for at least 2 weeks or as directed by physician office. They may be removed at night for sleeping.  MEDICATIONS:  See your medication summary on the "After Visit Summary" that nursing will review with you.  You may have some home medications which will be placed on hold until you complete the course of  blood thinner medication.  It is important for you to complete the blood thinner medication as prescribed.  PRECAUTIONS:  If you experience chest pain or shortness of breath - call 911 immediately for transfer to the hospital emergency department.   If you develop a fever greater that 101 F, purulent drainage from wound, increased redness or drainage from wound, foul odor from the wound/dressing, or calf pain - CONTACT YOUR SURGEON.                                                   FOLLOW-UP APPOINTMENTS:  If you do not already have a post-op appointment, please call the office for an appointment to be seen by your surgeon.  Guidelines for how soon to be seen are listed in your "After Visit Summary", but are typically between 1-4 weeks after surgery.  OTHER INSTRUCTIONS:   Knee Replacement:  Do not place pillow under knee, focus  on keeping the knee straight while resting. CPM instructions: 0-90 degrees, 2 hours in the morning, 2 hours in the afternoon, and 2 hours in the evening. Place foam block, curve side up under heel at all times except when in CPM or when walking.  DO NOT modify, tear, cut, or change the foam block in any way.  MAKE SURE YOU:  Understand these instructions.  Get help right away if you are not doing well or get worse.    Thank you for letting us be a part of your medical care team.  It is a privilege we respect greatly.  We hope these instructions will help you stay on track for a fast and full recovery!   Increase activity slowly as tolerated   Complete by:  As directed    Increase activity slowly as tolerated   Complete by:  As directed        Contact information for follow-up providers    Melrose Nakayama, MD. Schedule an appointment as soon as possible for a visit in 2 weeks.   Specialty:  Orthopedic Surgery Contact information: [place] [address] [phone]            Contact information for after-discharge care    Destination     [facility] .   Service:  Skilled Nursing Contact information: [place] [phone]                   Signed: Larwance Sachs Beckett Hickmon 08/17/2018, 7:59 AM

## 2018-08-17 NOTE — Clinical Social Work Placement (Signed)
   CLINICAL SOCIAL WORK PLACEMENT  NOTE  Date:  08/17/2018  Patient Details  Name: Robin Christensen MRN: [id number] Date of Birth: 08-23-1950  Clinical Social Work is seeking post-discharge placement for this patient at the [place] level of care (*CSW will initial, date and re-position this form in  chart as items are completed):      Patient/family provided with [facility] Work Department's list of facilities offering this level of care within the geographic area requested by the patient (or if unable, by the patient's family).  Yes   Patient/family informed of their freedom to choose among providers that offer the needed level of care, that participate in Medicare, Medicaid or managed care program needed by the patient, have an available bed and are willing to accept the patient.      Patient/family informed of [facility]'s ownership interest in [facility] and [facility], as well as of the fact that they are under no obligation to receive care at these facilities.  PASRR submitted to EDS on       PASRR number received on 08/15/18     Existing PASRR number confirmed on       FL2 transmitted to all facilities in geographic area requested by pt/family on 08/15/18     FL2 transmitted to all facilities within larger geographic area on       Patient informed that his/her managed care company has contracts with or will negotiate with certain facilities, including the following:        Yes   Patient/family informed of bed offers received.  Patient chooses bed at [facility]     Physician recommends and patient chooses bed at      Patient to be transferred to [facility] on 08/17/18.  Patient to be transferred to facility by PTAR     Patient family notified on 08/17/18 of transfer.  Name of family member notified:  Lissa Hoard (son)      PHYSICIAN       Additional Comment:     _______________________________________________ Alberteen Sam, LCSW 08/17/2018, 11:09 AM

## 2018-08-17 NOTE — Plan of Care (Signed)

## 2018-08-17 NOTE — Progress Notes (Signed)
Subjective: 3 Days Post-Op Procedure(s) (LRB): TOTAL KNEE ARTHROPLASTY (Left) Patient feels better and wants to go to SNF [facility] today.; Activity level:  wbat Diet tolerance:  ok Voiding:  ok Patient reports pain as mild.    Objective: Vital signs in last 24 hours: Temp:  [98 F (36.7 C)-99.6 F (37.6 C)] 98 F (36.7 C) (01/31 0508) Pulse Rate:  [64-84] 64 (01/31 0508) Resp:  [16] 16 (01/30 1951) BP: (103-133)/(45-58) 133/58 (01/31 0508) SpO2:  [97 %-99 %] 99 % (01/31 0508)  Labs: Recent Labs    08/16/18 1133  HGB 12.1   Recent Labs    08/16/18 1133  WBC 7.3  RBC 4.07  HCT 37.3  PLT 165   Recent Labs    08/16/18 1133  NA 138  K 3.5  CL 104  CO2 26  BUN 8  CREATININE 0.80  GLUCOSE 124*  CALCIUM 8.4*   No results for input(s): LABPT, INR in the last 72 hours.  Physical Exam:  Neurologically intact ABD soft Neurovascular intact Sensation intact distally Intact pulses distally Dorsiflexion/Plantar flexion intact Incision: dressing C/D/I and no drainage No cellulitis present Compartment soft  Assessment/Plan:  3 Days Post-Op Procedure(s) (LRB): TOTAL KNEE ARTHROPLASTY (Left) Advance diet Up with therapy Discharge to SNF [facility] today after PT. Continue on home eliquis for DVT prevention Follow up in office 2 weeks post op. Anticipated LOS equal to or greater than 2 midnights due to - Age 69 and older with one or more of the following:  - Obesity  - Expected need for hospital services (PT, OT, Nursing) required for safe  discharge  - Anticipated need for postoperative skilled nursing care or inpatient rehab  - Active co-morbidities: None OR   - Unanticipated findings during/Post Surgery: Slow post-op progression: GI, pain control, mobility  - Patient is a high risk of re-admission due to: Barriers to post-acute care (logistical, no family support in home)   [place] 08/17/2018, 7:57 AM

## 2018-08-17 NOTE — Progress Notes (Signed)
Called facility, gave report to nurse Mia, all questions and concerns addressed, Pt not in distress, discharged to facility with belongings via PTAR.

## 2018-08-17 NOTE — Progress Notes (Signed)
Patient will DC to: [facility] Anticipated DC date: 08/17/2018 Family notified:[facility] UT:MLYY  Per MD patient ready for DC to [facility] . RN, patient, patient's family, and facility notified of DC. Discharge Summary sent to facility. RN given number for report [phone]. DC packet on chart. Ambulance transport requested for patient.  CSW signing off.  [place], [place]

## 2018-08-20 DIAGNOSIS — I480 Paroxysmal atrial fibrillation: Secondary | ICD-10-CM | POA: Diagnosis not present

## 2018-08-20 DIAGNOSIS — M1712 Unilateral primary osteoarthritis, left knee: Secondary | ICD-10-CM | POA: Diagnosis not present

## 2018-08-20 DIAGNOSIS — E7849 Other hyperlipidemia: Secondary | ICD-10-CM | POA: Diagnosis not present

## 2018-08-20 DIAGNOSIS — I10 Essential (primary) hypertension: Secondary | ICD-10-CM | POA: Diagnosis not present

## 2018-08-22 DIAGNOSIS — M1712 Unilateral primary osteoarthritis, left knee: Secondary | ICD-10-CM | POA: Diagnosis not present

## 2018-08-22 DIAGNOSIS — E039 Hypothyroidism, unspecified: Secondary | ICD-10-CM | POA: Diagnosis not present

## 2018-08-22 DIAGNOSIS — I10 Essential (primary) hypertension: Secondary | ICD-10-CM | POA: Diagnosis not present

## 2018-08-22 DIAGNOSIS — I480 Paroxysmal atrial fibrillation: Secondary | ICD-10-CM | POA: Diagnosis not present

## 2018-08-23 DIAGNOSIS — E7849 Other hyperlipidemia: Secondary | ICD-10-CM | POA: Diagnosis not present

## 2018-08-23 DIAGNOSIS — I10 Essential (primary) hypertension: Secondary | ICD-10-CM | POA: Diagnosis not present

## 2018-08-23 DIAGNOSIS — M1712 Unilateral primary osteoarthritis, left knee: Secondary | ICD-10-CM | POA: Diagnosis not present

## 2018-08-23 DIAGNOSIS — I480 Paroxysmal atrial fibrillation: Secondary | ICD-10-CM | POA: Diagnosis not present

## 2018-08-24 DIAGNOSIS — M1712 Unilateral primary osteoarthritis, left knee: Secondary | ICD-10-CM | POA: Diagnosis not present

## 2018-08-28 ENCOUNTER — Other Ambulatory Visit: Payer: Self-pay

## 2018-08-28 ENCOUNTER — Ambulatory Visit: Payer: Medicare HMO | Admitting: Physical Therapy

## 2018-08-28 ENCOUNTER — Encounter: Payer: Self-pay | Admitting: Physical Therapy

## 2018-08-28 DIAGNOSIS — M25662 Stiffness of left knee, not elsewhere classified: Secondary | ICD-10-CM

## 2018-08-28 DIAGNOSIS — R600 Localized edema: Secondary | ICD-10-CM

## 2018-08-28 DIAGNOSIS — R2689 Other abnormalities of gait and mobility: Secondary | ICD-10-CM

## 2018-08-28 DIAGNOSIS — M25562 Pain in left knee: Secondary | ICD-10-CM

## 2018-08-28 NOTE — Patient Outreach (Signed)
[facility]) Care Management  08/28/2018  Robin Christensen 07-09-1951 [id number]     Transition of Care Referral  Referral Date: 08/28/2018 Referral Source: [facility] Discharge Report Date of Admission: 08/17/2018 Diagnosis: "OA of left knee" Date of Discharge: 08/26/2018 Facility:[facility] [place]: [facility]    Outreach attempt # 1 to patient. Spoke with patient who states she is doing fairly well. She reports that she was told knee surgery and the recovery afterwards would be challenging. Patient voices that she has supportive neighbor who is assisting her with her care needs. She started outpatient therapy today. Patient states that she had a lot of pain during and after session. She did not premedicate herself with pain med and only took muscle relaxer. RN CM discussed with patient the importance of premedicating prior to activities/sessions. Patient voiced understanding and stated she would do so. She is getting around using a walker and has been told by therapy she should not need walker within a few weeks of treatments. RN CM confirmed with patient she has all her meds in the home and she denies any issues or concerns regarding them. She is aware to seek medical attention for any new and/or worsening issues. No THN needs or concerns identified during this call. Patient appreciative of call to check on her.    Plan: RN CM will close case as no further interventions needed at this time.   Enzo Montgomery, RN,BSN,CCM [facility] Management Telephonic Care Management Coordinator Direct Phone: [phone] Toll Free: [phone] Fax: [phone]

## 2018-08-28 NOTE — Patient Instructions (Signed)
Access Code: [email or web address]  URL: [email or web address]  Date: 08/28/2018  Prepared by: Faustino Congress   Exercises  Supine Quadricep Sets - 10 reps - 1 sets - 5 sec hold - 3x daily - 7x weekly  Supine Heel Slide with Strap - 10 reps - 1 sets - 3x daily - 7x weekly  Seated Hamstring Stretch - 3 reps - 1 sets - 30 sec hold - 3x daily - 7x weekly  Small Range Straight Leg Raise - 10 reps - 1 sets - 3x daily - 7x weekly

## 2018-08-28 NOTE — Therapy (Signed)
[place], [place], [address] Phone: [phone]   Fax:  [phone]  Physical Therapy Evaluation  Patient Details  Name: Robin Christensen MRN: [id number] Date of Birth: [birth year] Referring Provider (PT): Dr. Melrose Nakayama   Encounter Date: 08/28/2018  PT End of Session - 08/28/18 0918    Visit Number  1    Number of Visits  20    Date for PT Re-Evaluation  10/23/18    PT Start Time  0840    PT Stop Time  0931    PT Time Calculation (min)  51 min    Activity Tolerance  Patient tolerated treatment well    Behavior During Therapy  [facility] for tasks assessed/performed       Past Medical History:  Diagnosis Date  . Cancer [facility])    Right Breast Cancer  . Dysrhythmia    AF  . Hyperlipidemia   . Hypertension   . Hypothyroidism   . Obesity   . Pneumonia   . PONV (postoperative nausea and vomiting)   . Sleep apnea    cpcap    Past Surgical History:  Procedure Laterality Date  . BREAST SURGERY    . CARDIOVERSION N/A 06/26/2014   Procedure: CARDIOVERSION;  Surgeon: Laverda Page, MD;  Location: [place];  Service: Cardiovascular;  Laterality: N/A;  . CHOLECYSTECTOMY    . TOTAL KNEE ARTHROPLASTY Right 05/03/2016   Procedure: TOTAL KNEE ARTHROPLASTY;  Surgeon: Melrose Nakayama, MD;  Location: [place];  Service: Orthopedics;  Laterality: Right;  . TOTAL KNEE ARTHROPLASTY Left 08/14/2018   Procedure: TOTAL KNEE ARTHROPLASTY;  Surgeon: Melrose Nakayama, MD;  Location: [place];  Service: Orthopedics;  Laterality: Left;    There were no vitals filed for this visit.   Subjective Assessment - 08/28/18 0846    Subjective  Pt is a 68 y/o female who presents to OPPT s/p Lt TKA on 08/14/2018.  Pt went to [facility] for PT initially and now presenting to OPPT for continued rehab.     How long can you sit comfortably?  pain with transition to/from stand    Patient Stated Goals  improve pain and return  to PLOF    Currently in Pain?  Yes    Pain Score  7     Pain Location  Knee    Pain Orientation  Left    Pain Descriptors / Indicators  Aching    Pain Type  Surgical pain;Acute pain    Pain Onset  1 to 4 weeks ago    Pain Frequency  Constant    Aggravating Factors   bending the knee, walking/standing, prolonged sitting    Pain Relieving Factors  medication, repositioning         [facility] PT Assessment - 08/28/18 0848      Assessment   Medical Diagnosis  Lt TKA    Referring Provider (PT)  Dr. Melrose Nakayama    Onset Date/Surgical Date  08/14/18    Hand Dominance  Right    Next MD Visit  09/07/2018    Prior Therapy  for LBP prior to TKA      Precautions   Precautions  None      Restrictions   Weight Bearing Restrictions  No      Balance Screen   Has the patient fallen in the past 6 months  No    Has the patient had a decrease in activity level because of a fear of  falling?   Yes    Is the patient reluctant to leave their home because of a fear of falling?   No      Home Environment   Living Environment  Private residence    Living Arrangements  Alone    Available Help at Discharge  Neighbor    Type of [place] Access  Level entry    [place]  Two level;Able to live on main level with bedroom/bathroom    [place] - 2 wheels      Prior Function   Level of Independence  Independent    Vocation  Retired    [employer]  retired from [employer]    Leisure  spend time with friends, movies; walking 3 miles/day 5-7 days/wk; swimming at [employer] 3-5 days/wk x 1 hour (hasn't exercised in 2 months)      Cognition   Overall Cognitive Status  Within Functional Limits for tasks assessed      Observation/Other Assessments   Observations  increased edema Lt knee; not measured as incision covered with bandage    Focus on Therapeutic Outcomes (FOTO)   33 (67% limited; predicted 46% limited)      ROM / Strength   AROM / PROM / Strength   AROM;PROM;Strength      AROM   AROM Assessment Site  Knee    Right/Left Knee  Right;Left    Left Knee Extension  -8    Left Knee Flexion  68      PROM   PROM Assessment Site  Knee    Right/Left Knee  Left    Left Knee Extension  -5    Left Knee Flexion  78      Strength   Overall Strength  Unable to assess;Due to pain   and due to post op status   Overall Strength Comments  deficits likely due to post op status and gait abnormalities      Palpation   Palpation comment  soreness and tenderness noted in Lt hamstring insertions      Ambulation/Gait   Ambulation/Gait  Yes    Ambulation/Gait Assistance  5: Supervision    Ambulation Distance (Feet)  75 Feet    Assistive device  Rolling walker    Gait Pattern  Decreased stance time - left;Decreased step length - right;Decreased hip/knee flexion - left;Decreased weight shift to left;Ataxic                Objective measurements completed on examination: See above findings.      [place] Adult PT Treatment/Exercise - 08/28/18 0848      Exercises   Exercises  Knee/Hip      Knee/Hip Exercises: Stretches   Passive Hamstring Stretch Limitations  demonstrated; did not perform      Knee/Hip Exercises: Supine   Quad Sets  Left;5 reps    Heel Slides  AAROM;Left;5 reps    Straight Leg Raises  Left;5 reps    Straight Leg Raises Limitations  5-10 degree quad lag present      Modalities   Modalities  Vasopneumatic      Vasopneumatic   Number Minutes Vasopneumatic   15 minutes    Vasopnuematic Location   Knee    Vasopneumatic Pressure  Medium    Vasopneumatic Temperature   34 deg      Manual Therapy   Manual Therapy  Passive ROM    Passive ROM  Lt knee into extension  and flexion             PT Education - 08/28/18 0918    Education Details  HEP    Person(s) Educated  Patient    Methods  Explanation;Demonstration;Handout    Comprehension  Verbalized understanding;Returned demonstration;Need further instruction        PT Short Term Goals - 08/28/18 0922      PT SHORT TERM GOAL #1   Title  independent with initial HEP    Status  New    Target Date  09/25/18      PT SHORT TERM GOAL #2   Title  Lt knee AROM 0-90 for improved function and mobiltiy    Status  New    Target Date  09/25/18      PT SHORT TERM GOAL #3   Title  report pain < 5/10 with ambulation for improved pain and function    Status  New    Target Date  09/25/18      PT SHORT TERM GOAL #4   Title  amb > 150' with [place] modifiied independent for improved function    Status  New    Target Date  09/25/18        PT Long Term Goals - 08/28/18 0923      PT LONG TERM GOAL #1   Title  independent with advanced HEP/community fitness program    Status  New    Target Date  10/23/18      PT LONG TERM GOAL #2   Title  FOTO score improved to </= 46% limited for improved function    Status  New    Target Date  10/23/18      PT LONG TERM GOAL #3   Title  report pain < 3/10 for improved function and mobility    Status  New    Target Date  10/23/18      PT LONG TERM GOAL #4   Title  amb > 300' without increase in pain or AD for improved function    Status  New    Target Date  10/23/18      PT LONG TERM GOAL #5   Title  Lt knee AROM 0-105 for improved function and mobility    Status  New    Target Date  10/23/18             Plan - 08/28/18 0912    Clinical Impression Statement  Pt is a 68 y/o female who presents to [facility] s/o Lt TKA on 08/14/2018, with recovery complicated by orthostatic hypotension.  Pt demonstrates decreased ROM, strength and gait abnormaltities affecting functional mobility.  Pt will benefit from PT to address deficits listed.    History and Personal Factors relevant to plan of care:  orthostatic hypotension, headaches, sleep apnea, obesity, HTN    Clinical Presentation  Evolving    Clinical Presentation due to:  difficult recovery due to orthostatic hypotension    Clinical Decision Making  Moderate     Rehab Potential  Good    PT Frequency  3x / week   3x/wk x 4 wks then 2x/wk x 4 wks   PT Duration  8 weeks    PT Treatment/Interventions  ADLs/Self Care Home Management;Cryotherapy;Ultrasound;Moist Heat;Electrical Stimulation;Gait training;Stair training;Iontophoresis 4mg /ml Dexamethasone;Neuromuscular re-education;Balance training;Functional mobility training;Therapeutic exercise;Therapeutic activities;Patient/family education;Manual techniques;Dry needling;Passive range of motion;Scar mobilization;Compression bandaging;Taping;Vasopneumatic Device    PT Next Visit Plan  review HEP, aggressive ROM, gait training with cane  PT Home Exercise Plan  Access Code: [email or web address]    Consulted and Agree with Plan of Care  Patient       Patient will benefit from skilled therapeutic intervention in order to improve the following deficits and impairments:  Abnormal gait, Increased fascial restricitons, Increased muscle spasms, Pain, Decreased mobility, Decreased activity tolerance, Decreased endurance, Decreased range of motion, Decreased strength, Impaired perceived functional ability, Impaired flexibility, Difficulty walking, Decreased balance  Visit Diagnosis: Acute pain of left knee - Plan: PT plan of care cert/re-cert  Stiffness of left knee, not elsewhere classified - Plan: PT plan of care cert/re-cert  Other abnormalities of gait and mobility - Plan: PT plan of care cert/re-cert  Localized edema - Plan: PT plan of care cert/re-cert     Problem List Patient Active Problem List   Diagnosis Date Noted  . Primary osteoarthritis of left knee 08/14/2018  . Primary localized osteoarthritis of right knee 05/03/2016  . Primary osteoarthritis of right knee 05/03/2016      Laureen Abrahams, PT, DPT 08/28/18 9:30 AM     [facility] [place], [place], [address] Phone: [phone]   Fax:  [phone]  Name: Robin Christensen MRN: [id number] Date of Birth: [birth year]

## 2018-08-31 ENCOUNTER — Ambulatory Visit: Payer: Medicare HMO | Admitting: Physical Therapy

## 2018-08-31 ENCOUNTER — Encounter: Payer: Self-pay | Admitting: Physical Therapy

## 2018-08-31 DIAGNOSIS — R2689 Other abnormalities of gait and mobility: Secondary | ICD-10-CM

## 2018-08-31 DIAGNOSIS — M25662 Stiffness of left knee, not elsewhere classified: Secondary | ICD-10-CM

## 2018-08-31 DIAGNOSIS — M25562 Pain in left knee: Secondary | ICD-10-CM

## 2018-08-31 NOTE — Therapy (Signed)
[place], [place], [address] Phone: [phone]   Fax:  [phone]  Physical Therapy Treatment  Patient Details  Name: Robin Christensen MRN: [id number] Date of Birth: 10/29/1950 Referring Provider (PT): Dr. Melrose Nakayama   Encounter Date: 08/31/2018  PT End of Session - 08/31/18 1336    Visit Number  2    Number of Visits  20    Date for PT Re-Evaluation  10/23/18    PT Start Time  1331    PT Stop Time  1412    PT Time Calculation (min)  41 min    Activity Tolerance  Patient limited by pain    Behavior During Therapy  [facility] for tasks assessed/performed       Past Medical History:  Diagnosis Date  . Cancer [facility])    Right Breast Cancer  . Dysrhythmia    AF  . Hyperlipidemia   . Hypertension   . Hypothyroidism   . Obesity   . Pneumonia   . PONV (postoperative nausea and vomiting)   . Sleep apnea    cpcap    Past Surgical History:  Procedure Laterality Date  . BREAST SURGERY    . CARDIOVERSION N/A 06/26/2014   Procedure: CARDIOVERSION;  Surgeon: Laverda Page, MD;  Location: [place];  Service: Cardiovascular;  Laterality: N/A;  . CHOLECYSTECTOMY    . TOTAL KNEE ARTHROPLASTY Right 05/03/2016   Procedure: TOTAL KNEE ARTHROPLASTY;  Surgeon: Melrose Nakayama, MD;  Location: [place];  Service: Orthopedics;  Laterality: Right;  . TOTAL KNEE ARTHROPLASTY Left 08/14/2018   Procedure: TOTAL KNEE ARTHROPLASTY;  Surgeon: Melrose Nakayama, MD;  Location: [place];  Service: Orthopedics;  Laterality: Left;    There were no vitals filed for this visit.  Subjective Assessment - 08/31/18 1337    Subjective  Pt reports she has been working on her exercises at home, but in a great deal of pain today. She wants lighter exercises today.      Patient Stated Goals  improve pain and return to PLOF    Currently in Pain?  Yes    Pain Score  7     Pain Location  Knee    Pain Orientation  Left          [facility] PT Assessment - 08/31/18 0001      Assessment   Medical Diagnosis  Lt TKA    Referring Provider (PT)  Dr. Melrose Nakayama    Onset Date/Surgical Date  08/14/18    Hand Dominance  Right    Next MD Visit  09/07/2018    Prior Therapy  for LBP prior to TKA       [facility] Adult PT Treatment/Exercise - 08/31/18 0001      Lumbar Exercises: Stretches   Passive Hamstring Stretch  Left;3 reps;30 seconds   seated with straight back     Lumbar Exercises: Aerobic   Nustep  L4 x arms/legs 6 min               Knee/Hip Exercises: Aerobic   Nustep  L4: arms/legs x 6 min       Knee/Hip Exercises: Standing   Gait Training  80 ft: with RW, VC to relax foot and increase Lt toe off, increased Lt knee flexion in swing through and increased Rt step length; imporved with cues and repetition.       Knee/Hip Exercises: Seated   Long Arc Quad  AROM;Left;1 set;15 reps  Sit to Sand  1 set;5 reps;with UE support   low black mat  Seated scoots x 5 reps of 10 sec holds.      Knee/Hip Exercises: Supine   Quad Sets  Left;5 reps    Heel Slides  AAROM;Left;5 reps    Straight Leg Raises  Left;5 reps    Straight Leg Raises Limitations  5-10 degree quad lag present      Modalities   Modalities  Vasopneumatic      Vasopneumatic   Number Minutes Vasopneumatic   10 minutes   unable to tolerate longer than 10 min    Vasopnuematic Location   Knee    Vasopneumatic Pressure  Low    Vasopneumatic Temperature   34 deg               PT Short Term Goals - 08/28/18 0922      PT SHORT TERM GOAL #1   Title  independent with initial HEP    Status  New    Target Date  09/25/18      PT SHORT TERM GOAL #2   Title  Lt knee AROM 0-90 for improved function and mobiltiy    Status  New    Target Date  09/25/18      PT SHORT TERM GOAL #3   Title  report pain < 5/10 with ambulation for improved pain and function    Status  New    Target Date  09/25/18      PT SHORT TERM GOAL #4   Title   amb > 150' with [place] modifiied independent for improved function    Status  New    Target Date  09/25/18        PT Long Term Goals - 08/28/18 0923      PT LONG TERM GOAL #1   Title  independent with advanced HEP/community fitness program    Status  New    Target Date  10/23/18      PT LONG TERM GOAL #2   Title  FOTO score improved to </= 46% limited for improved function    Status  New    Target Date  10/23/18      PT LONG TERM GOAL #3   Title  report pain < 3/10 for improved function and mobility    Status  New    Target Date  10/23/18      PT LONG TERM GOAL #4   Title  amb > 300' without increase in pain or AD for improved function    Status  New    Target Date  10/23/18      PT LONG TERM GOAL #5   Title  Lt knee AROM 0-105 for improved function and mobility    Status  New    Target Date  10/23/18            Plan - 08/31/18 1626    Clinical Impression Statement  Pt had difficulty tolerating much exercise today due to increased pain level.  Notable swelling in LLE through lower leg and ankle.  Encouraged pt to wear compression sock and elevated leg above heart on/off throughout day.  ROM similar to last assessment.  Pt unable to tolerate >10 min of vaso due to increased pain in LE.  Goals are ongoing at this time.     Rehab Potential  Good    PT Frequency  3x / week   3x/wk x 4 wks then 2x/wk x  4 wks   PT Duration  8 weeks    PT Treatment/Interventions  ADLs/Self Care Home Management;Cryotherapy;Ultrasound;Moist Heat;Electrical Stimulation;Gait training;Stair training;Iontophoresis 4mg /ml Dexamethasone;Neuromuscular re-education;Balance training;Functional mobility training;Therapeutic exercise;Therapeutic activities;Patient/family education;Manual techniques;Dry needling;Passive range of motion;Scar mobilization;Compression bandaging;Taping;Vasopneumatic Device    PT Next Visit Plan  review HEP, aggressive ROM, gait training with cane    PT Home Exercise Plan  Access  Code: [email or web address]    [email or web address] and Agree with Plan of Care  Patient       Patient will benefit from skilled therapeutic intervention in order to improve the following deficits and impairments:  Abnormal gait, Increased fascial restricitons, Increased muscle spasms, Pain, Decreased mobility, Decreased activity tolerance, Decreased endurance, Decreased range of motion, Decreased strength, Impaired perceived functional ability, Impaired flexibility, Difficulty walking, Decreased balance  Visit Diagnosis: Acute pain of left knee  Stiffness of left knee, not elsewhere classified  Other abnormalities of gait and mobility     Problem List Patient Active Problem List   Diagnosis Date Noted  . Primary osteoarthritis of left knee 08/14/2018  . Primary localized osteoarthritis of right knee 05/03/2016  . Primary osteoarthritis of right knee 05/03/2016   Kerin Perna, PTA 08/31/18 4:30 PM  [place], [place], [address] Phone: [phone]   Fax:  [phone]  Name: Robin Christensen MRN: [id number] Date of Birth: 08-Oct-1950

## 2018-09-03 ENCOUNTER — Ambulatory Visit: Payer: Medicare HMO | Admitting: Physical Therapy

## 2018-09-03 ENCOUNTER — Encounter: Payer: Self-pay | Admitting: Physical Therapy

## 2018-09-03 DIAGNOSIS — M25662 Stiffness of left knee, not elsewhere classified: Secondary | ICD-10-CM | POA: Diagnosis not present

## 2018-09-03 DIAGNOSIS — R2689 Other abnormalities of gait and mobility: Secondary | ICD-10-CM

## 2018-09-03 DIAGNOSIS — M25562 Pain in left knee: Secondary | ICD-10-CM | POA: Diagnosis not present

## 2018-09-03 DIAGNOSIS — R600 Localized edema: Secondary | ICD-10-CM

## 2018-09-03 NOTE — Therapy (Signed)
[place], [place], [address] Phone: [phone]   Fax:  [phone]  Physical Therapy Treatment  Patient Details  Name: Robin Christensen MRN: [id number] Date of Birth: 12/09/1950 Referring Provider (PT): Dr. Melrose Nakayama   Encounter Date: 09/03/2018  PT End of Session - 09/03/18 1514    Visit Number  3    Number of Visits  20    Date for PT Re-Evaluation  10/23/18    PT Start Time  2035    PT Stop Time  1529    PT Time Calculation (min)  58 min    Activity Tolerance  Patient limited by pain    Behavior During Therapy  [facility] for tasks assessed/performed       Past Medical History:  Diagnosis Date  . Cancer [facility])    Right Breast Cancer  . Dysrhythmia    AF  . Hyperlipidemia   . Hypertension   . Hypothyroidism   . Obesity   . Pneumonia   . PONV (postoperative nausea and vomiting)   . Sleep apnea    cpcap    Past Surgical History:  Procedure Laterality Date  . BREAST SURGERY    . CARDIOVERSION N/A 06/26/2014   Procedure: CARDIOVERSION;  Surgeon: Laverda Page, MD;  Location: [place];  Service: Cardiovascular;  Laterality: N/A;  . CHOLECYSTECTOMY    . 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 TOTAL KNEE ARTHROPLASTY Left 08/14/2018   Procedure: TOTAL KNEE ARTHROPLASTY;  Surgeon: Melrose Nakayama, MD;  Location: [place];  Service: Orthopedics;  Laterality: Left;    There were no vitals filed for this visit.  Subjective Assessment - 09/03/18 1440    Subjective  feels she's doing much better today.  still having pain and stiffness.    Patient Stated Goals  improve pain and return to PLOF    Currently in Pain?  Yes    Pain Score  5     Pain Location  Knee    Pain Orientation  Left    Pain Descriptors / Indicators  Aching    Pain Type  Surgical  pain;Acute pain    Pain Onset  1 to 4 weeks ago    Pain Frequency  Constant    Aggravating Factors   bending the knee, walking/standing, prolonged sitting    Pain Relieving Factors  meds, repositioning         [facility] PT Assessment - 09/03/18 1457      Assessment   Medical Diagnosis  Lt TKA    Referring Provider (PT)  Dr. Melrose Nakayama    Onset Date/Surgical Date  08/14/18      AROM   Left Knee Extension  -2    Left Knee Flexion  79      PROM   Left Knee Flexion  90                   [facility] Adult PT Treatment/Exercise - 09/03/18 1441      Ambulation/Gait   Ambulation/Gait  Yes    Ambulation/Gait Assistance  5: Supervision    Ambulation/Gait Assistance Details  cues for sequencing    Ambulation Distance (Feet)  160 Feet    Assistive device  Straight cane    Gait Pattern  Decreased stance time - left;Decreased step length - right;Decreased hip/knee flexion -  left;Decreased weight shift to left;Ataxic      Knee/Hip Exercises: Stretches   Passive Hamstring Stretch  Left;3 reps;30 seconds   supine with strap; with overpressure     Knee/Hip Exercises: Aerobic   Nustep  L4: arms/legs x 8 min       Knee/Hip Exercises: Supine   Quad Sets  Left;10 reps    Short Arc Quad Sets  Left;10 reps   5 sec hold   Heel Slides  AAROM;Left;10 reps   with strap     Vasopneumatic   Number Minutes Vasopneumatic   15 minutes    Vasopnuematic Location   Knee    Vasopneumatic Pressure  Low    Vasopneumatic Temperature   34 deg      Manual Therapy   Passive ROM  Lt knee into extension and flexion               PT Short Term Goals - 08/28/18 0922      PT SHORT TERM GOAL #1   Title  independent with initial HEP    Status  New    Target Date  09/25/18      PT SHORT TERM GOAL #2   Title  Lt knee AROM 0-90 for improved function and mobiltiy    Status  New    Target Date  09/25/18      PT SHORT TERM GOAL #3   Title  report pain < 5/10 with ambulation for improved  pain and function    Status  New    Target Date  09/25/18      PT SHORT TERM GOAL #4   Title  amb > 150' with [place] modifiied independent for improved function    Status  New    Target Date  09/25/18        PT Long Term Goals - 08/28/18 0923      PT LONG TERM GOAL #1   Title  independent with advanced HEP/community fitness program    Status  New    Target Date  10/23/18      PT LONG TERM GOAL #2   Title  FOTO score improved to </= 46% limited for improved function    Status  New    Target Date  10/23/18      PT LONG TERM GOAL #3   Title  report pain < 3/10 for improved function and mobility    Status  New    Target Date  10/23/18      PT LONG TERM GOAL #4   Title  amb > 300' without increase in pain or AD for improved function    Status  New    Target Date  10/23/18      PT LONG TERM GOAL #5   Title  Lt knee AROM 0-105 for improved function and mobility    Status  New    Target Date  10/23/18            Plan - 09/03/18 1515    Clinical Impression Statement  Pt continues to demonstrate gradual improvements in ROM today.  No goals met but progressing well towards goals.  Will continue to benefit from PT to maximize function.    Rehab Potential  Good    PT Frequency  3x / week   3x/wk x 4 wks then 2x/wk x 4 wks   PT Duration  8 weeks    PT Treatment/Interventions  ADLs/Self Care Home Management;Cryotherapy;Ultrasound;Moist Heat;Electrical Stimulation;Gait training;Stair training;Iontophoresis  34m/ml Dexamethasone;Neuromuscular re-education;Balance training;Functional mobility training;Therapeutic exercise;Therapeutic activities;Patient/family education;Manual techniques;Dry needling;Passive range of motion;Scar mobilization;Compression bandaging;Taping;Vasopneumatic Device    PT Next Visit Plan  review HEP, aggressive ROM, cont gait training with cane    PT Home Exercise Plan  Access Code: [email or web address]    [email or web address] Agree with Plan of Care  Patient       Patient  will benefit from skilled therapeutic intervention in order to improve the following deficits and impairments:  Abnormal gait, Increased fascial restricitons, Increased muscle spasms, Pain, Decreased mobility, Decreased activity tolerance, Decreased endurance, Decreased range of motion, Decreased strength, Impaired perceived functional ability, Impaired flexibility, Difficulty walking, Decreased balance  Visit Diagnosis: Acute pain of left knee  Stiffness of left knee, not elsewhere classified  Localized edema  Other abnormalities of gait and mobility     Problem List Patient Active Problem List   Diagnosis Date Noted  . Primary osteoarthritis of left knee 08/14/2018  . Primary localized osteoarthritis of right knee 05/03/2016  . Primary osteoarthritis of right knee 05/03/2016      SLaureen Abrahams PT, DPT 09/03/18 3:16 PM     [facility] [address]Phone: [phone]  Fax:  [phone] Name: PTHU BAGGETTMRN: [id number] of Birth: [birth year]

## 2018-09-05 ENCOUNTER — Ambulatory Visit: Payer: Medicare HMO | Admitting: Physical Therapy

## 2018-09-05 ENCOUNTER — Encounter: Payer: Self-pay | Admitting: Physical Therapy

## 2018-09-05 ENCOUNTER — Ambulatory Visit: Payer: Medicare HMO | Admitting: Neurology

## 2018-09-05 DIAGNOSIS — M25562 Pain in left knee: Secondary | ICD-10-CM | POA: Diagnosis not present

## 2018-09-05 DIAGNOSIS — M25662 Stiffness of left knee, not elsewhere classified: Secondary | ICD-10-CM

## 2018-09-05 DIAGNOSIS — R600 Localized edema: Secondary | ICD-10-CM | POA: Diagnosis not present

## 2018-09-05 DIAGNOSIS — R2689 Other abnormalities of gait and mobility: Secondary | ICD-10-CM | POA: Diagnosis not present

## 2018-09-05 NOTE — Therapy (Signed)
[place], [place], [address] Phone: [phone]   Fax:  [phone]  Physical Therapy Treatment  Patient Details  Name: Robin Christensen MRN: [id number] Date of Birth: [birth year] Referring Provider (PT): Dr. Melrose Nakayama   Encounter Date: 09/05/2018  PT End of Session - 09/05/18 1437    Visit Number  4    Number of Visits  20    Date for PT Re-Evaluation  10/23/18    PT Start Time  2376    PT Stop Time  1515   pt needed to leave early due to ride.    PT Time Calculation (min)  43 min    Behavior During Therapy  [facility] for tasks assessed/performed       Past Medical History:  Diagnosis Date  . Cancer [facility])    Right Breast Cancer  . Dysrhythmia    AF  . Hyperlipidemia   . Hypertension   . Hypothyroidism   . Obesity   . Pneumonia   . PONV (postoperative nausea and vomiting)   . Sleep apnea    cpcap    Past Surgical History:  Procedure Laterality Date  . BREAST SURGERY    . CARDIOVERSION N/A 06/26/2014   Procedure: CARDIOVERSION;  Surgeon: Laverda Page, MD;  Location: [place];  Service: Cardiovascular;  Laterality: N/A;  . CHOLECYSTECTOMY    . 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 TOTAL KNEE ARTHROPLASTY Left 08/14/2018   Procedure: TOTAL KNEE ARTHROPLASTY;  Surgeon: Melrose Nakayama, MD;  Location: [place];  Service: Orthopedics;  Laterality: Left;    There were no vitals filed for this visit.  Subjective Assessment - 09/05/18 1437    Subjective  Pt feels like she is getting around a little better today.  "I can make it 6-7 hours between pain medication".      How long can you sit comfortably?  pain with transition to/from stand    Patient Stated Goals  improve pain and return to PLOF    Currently in Pain?  Yes    Pain Score  4     Pain  Location  Knee    Pain Orientation  Left    Pain Descriptors / Indicators  Aching;Dull    Aggravating Factors   bending the knee    Pain Relieving Factors  medication, repositioning.          [facility] PT Assessment - 09/05/18 0001      Assessment   Medical Diagnosis  Lt TKA    Referring Provider (PT)  Dr. Melrose Nakayama    Onset Date/Surgical Date  08/14/18    Next MD Visit  09/07/18      PROM   Left Knee Extension  -4    Left Knee Flexion  90        [facility] Adult PT Treatment/Exercise - 09/05/18 0001      Lumbar Exercises: Aerobic   Nustep  --      Knee/Hip Exercises: Stretches   Passive Hamstring Stretch  Left;3 reps;30 seconds   supine with strap; with overpressure     Knee/Hip Exercises: Aerobic   Nustep  L4: arms/legs x 5 min       Knee/Hip Exercises: Standing   Lateral Step Up  Left;1 set;10 reps;Hand Hold: 2;Step Height: 4"    Forward Step Up  Left;10  reps;Hand Hold: 2;Step Height: 4";1 set    Forward Step Up Limitations  1 set of 5 reps on 6" step (with retro step down), heavy UE support on rail. VC to not hike hip      Knee/Hip Exercises: Seated   Long Arc Quad  Strengthening;Left;Weights;1 set;15 reps    Long Arc Quad Weight  2 lbs.    Other Seated Knee/Hip Exercises  seated scoots x 10 sec holds x 10 reps     Sit to Sand  1 set;5 reps;with UE support   low black mat     Knee/Hip Exercises: Supine   Heel Slides  AAROM;Left;5 reps   with strap   Straight Leg Raises  Left;5 reps    Straight Leg Raises Limitations  5-10 degree quad lag present      Vasopneumatic   Number Minutes Vasopneumatic   10 minutes    Vasopnuematic Location   Knee    Vasopneumatic Pressure  Low    Vasopneumatic Temperature   34 deg      Manual Therapy   Passive ROM  Lt knee into extension and flexion               PT Short Term Goals - 09/05/18 1711      PT SHORT TERM GOAL #1   Title  independent with initial HEP    Status  Achieved    Target Date  09/25/18       PT SHORT TERM GOAL #2   Title  Lt knee AROM 0-90 for improved function and mobiltiy    Status  Partially Met    Target Date  09/25/18      PT SHORT TERM GOAL #3   Title  report pain < 5/10 with ambulation for improved pain and function    Status  On-going    Target Date  09/25/18      PT SHORT TERM GOAL #4   Title  amb > 150' with [place] modifiied independent for improved function    Status  Partially Met    Target Date  09/25/18        PT Long Term Goals - 08/28/18 0923      PT LONG TERM GOAL #1   Title  independent with advanced HEP/community fitness program    Status  New    Target Date  10/23/18      PT LONG TERM GOAL #2   Title  FOTO score improved to </= 46% limited for improved function    Status  New    Target Date  10/23/18      PT LONG TERM GOAL #3   Title  report pain < 3/10 for improved function and mobility    Status  New    Target Date  10/23/18      PT LONG TERM GOAL #4   Title  amb > 300' without increase in pain or AD for improved function    Status  New    Target Date  10/23/18      PT LONG TERM GOAL #5   Title  Lt knee AROM 0-105 for improved function and mobility    Status  New    Target Date  10/23/18            Plan - 09/05/18 1511    Clinical Impression Statement  Pt has had gradual improvement in Lt knee ROM (4-90 deg flexion).  Pt was able to ascend/descend 4" steps with heavy UE  support on rails; she was unable to descend forward due to decreased ROM and increased pain.  Pt is progressing towards goals and will benefit from continued PT intervention to max functional mobility and safety.      Clinical Presentation  --    Rehab Potential  Good    PT Frequency  3x / week   see eval   PT Duration  4 weeks    PT Treatment/Interventions  ADLs/Self Care Home Management;Cryotherapy;Ultrasound;Moist Heat;Electrical Stimulation;Gait training;Stair training;Iontophoresis 79m/ml Dexamethasone;Neuromuscular re-education;Balance training;Functional  mobility training;Therapeutic exercise;Therapeutic activities;Patient/family education;Manual techniques;Dry needling;Passive range of motion;Scar mobilization;Compression bandaging;Taping;Vasopneumatic Device    PT Next Visit Plan  review HEP, aggressive ROM, cont gait training with cane    PT Home Exercise Plan  Access Code: [email or web address]    [email or web address] Agree with Plan of Care  Patient       Patient will benefit from skilled therapeutic intervention in order to improve the following deficits and impairments:  Abnormal gait, Increased fascial restricitons, Increased muscle spasms, Pain, Decreased mobility, Decreased activity tolerance, Decreased endurance, Decreased range of motion, Decreased strength, Impaired perceived functional ability, Impaired flexibility, Difficulty walking, Decreased balance  Visit Diagnosis: Acute pain of left knee  Stiffness of left knee, not elsewhere classified  Localized edema  Other abnormalities of gait and mobility     Problem List Patient Active Problem List   Diagnosis Date Noted  . Primary osteoarthritis of left knee 08/14/2018  . Primary localized osteoarthritis of right knee 05/03/2016  . Primary osteoarthritis of right knee 05/03/2016   JKerin Perna PTA 09/05/18 5:14 PM  [facility] [place] [address]Phone: [phone]  Fax:  [phone] Name: PBRENYA TAULBEEMRN: [id number] of Birth: 206-21-1952

## 2018-09-07 ENCOUNTER — Ambulatory Visit: Payer: Medicare HMO | Admitting: Physical Therapy

## 2018-09-07 ENCOUNTER — Encounter: Payer: Self-pay | Admitting: Physical Therapy

## 2018-09-07 DIAGNOSIS — M25562 Pain in left knee: Secondary | ICD-10-CM | POA: Diagnosis not present

## 2018-09-07 DIAGNOSIS — M25662 Stiffness of left knee, not elsewhere classified: Secondary | ICD-10-CM | POA: Diagnosis not present

## 2018-09-07 DIAGNOSIS — R600 Localized edema: Secondary | ICD-10-CM

## 2018-09-07 DIAGNOSIS — Z96653 Presence of artificial knee joint, bilateral: Secondary | ICD-10-CM | POA: Diagnosis not present

## 2018-09-07 DIAGNOSIS — R2689 Other abnormalities of gait and mobility: Secondary | ICD-10-CM

## 2018-09-07 DIAGNOSIS — Z471 Aftercare following joint replacement surgery: Secondary | ICD-10-CM | POA: Diagnosis not present

## 2018-09-07 NOTE — Therapy (Signed)
[place], [place], [address] Phone: [phone]   Fax:  [phone]  Physical Therapy Treatment  Patient Details  Name: Robin Christensen MRN: [id number] Date of Birth: [birth year] Referring Provider (PT): Dr. Melrose Nakayama   Encounter Date: 09/07/2018  PT End of Session - 09/07/18 1404    Visit Number  5    Number of Visits  20    Date for PT Re-Evaluation  10/23/18    PT Start Time  1400    PT Stop Time  1454    PT Time Calculation (min)  54 min    Activity Tolerance  Patient tolerated treatment well    Behavior During Therapy  [facility] for tasks assessed/performed       Past Medical History:  Diagnosis Date  . Cancer [facility])    Right Breast Cancer  . Dysrhythmia    AF  . Hyperlipidemia   . Hypertension   . Hypothyroidism   . Obesity   . Pneumonia   . PONV (postoperative nausea and vomiting)   . Sleep apnea    cpcap    Past Surgical History:  Procedure Laterality Date  . BREAST SURGERY    . CARDIOVERSION N/A 06/26/2014   Procedure: CARDIOVERSION;  Surgeon: Laverda Page, MD;  Location: [place];  Service: Cardiovascular;  Laterality: N/A;  . CHOLECYSTECTOMY    . 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 TOTAL KNEE ARTHROPLASTY Left 08/14/2018   Procedure: TOTAL KNEE ARTHROPLASTY;  Surgeon: Melrose Nakayama, MD;  Location: [place];  Service: Orthopedics;  Laterality: Left;    There were no vitals filed for this visit.  Subjective Assessment - 09/07/18 1404    Subjective  Pt visited MD and "he said I'm doing great and was happy to see me wearing my stocking".      Currently in Pain?  Yes    Pain Score  5     Pain Location  Knee    Pain Orientation  Left    Pain Descriptors / Indicators  Aching    Aggravating Factors   bending knee     Pain  Relieving Factors  medication          [facility] PT Assessment - 09/07/18 0001      Assessment   Medical Diagnosis  Lt TKA    Referring Provider (PT)  Dr. Melrose Nakayama    Onset Date/Surgical Date  08/14/18    Next MD Visit  10/05/18         [place] Adult PT Treatment/Exercise - 09/07/18 0001      Knee/Hip Exercises: Aerobic   Recumbent Bike  Partial revolutions x 6 min for Lt knee ROM      Knee/Hip Exercises: Standing   Heel Raises  Both;1 set;10 reps   and toe raises, UE on counter   Lateral Step Up  Left;1 set;10 reps;Hand Hold: 2;Step Height: 6"    Forward Step Up  Left;1 set;10 reps;Hand Hold: 2;Step Height: 6"    Forward Step Up Limitations  and 1 set of 10 forward step up on 4"     Step Down  Right;10 reps;Hand Hold: 2;Step Height: 4"    SLS  weight shifts to LLE with UE support on counter to SLS then holding 5-10 sec x 5 reps     Gait Training  160  ft with SPC, VC for increased step length with RLE and increased toe off with LLE.  Improved with increased distance and cues.     Other Standing Knee Exercises  side stepping with hands on counter x 10 ft Rt/Lt       Knee/Hip Exercises: Seated   Long Arc Quad  AROM;Left;10 reps    Other Seated Knee/Hip Exercises  seated scoots x 10 sec holds x 5 reps     Other Seated Knee/Hip Exercises  heel/toe raises x 10 each    Sit to Sand  1 set;with UE support;10 reps   low black mat, with 3" pad ontop.      Knee/Hip Exercises: Supine   Quad Sets  Left;10 reps      Moist Heat Therapy   Number Minutes Moist Heat  10 Minutes    Moist Heat Location  Lumbar Spine      Electrical Stimulation   Electrical Stimulation Location  Rt SI joint     Electrical Stimulation Action  TENS    Electrical Stimulation Parameters  intensity to pt tolerance    Electrical Stimulation Goals  Pain      Vasopneumatic   Number Minutes Vasopneumatic   10 minutes    Vasopnuematic Location   Knee    Vasopneumatic Pressure  Low    Vasopneumatic Temperature    36 deg               PT Short Term Goals - 09/05/18 1711      PT SHORT TERM GOAL #1   Title  independent with initial HEP    Status  Achieved    Target Date  09/25/18      PT SHORT TERM GOAL #2   Title  Lt knee AROM 0-90 for improved function and mobiltiy    Status  Partially Met    Target Date  09/25/18      PT SHORT TERM GOAL #3   Title  report pain < 5/10 with ambulation for improved pain and function    Status  On-going    Target Date  09/25/18      PT SHORT TERM GOAL #4   Title  amb > 150' with [place] modifiied independent for improved function    Status  Partially Met    Target Date  09/25/18        PT Long Term Goals - 08/28/18 0923      PT LONG TERM GOAL #1   Title  independent with advanced HEP/community fitness program    Status  New    Target Date  10/23/18      PT LONG TERM GOAL #2   Title  FOTO score improved to </= 46% limited for improved function    Status  New    Target Date  10/23/18      PT LONG TERM GOAL #3   Title  report pain < 3/10 for improved function and mobility    Status  New    Target Date  10/23/18      PT LONG TERM GOAL #4   Title  amb > 300' without increase in pain or AD for improved function    Status  New    Target Date  10/23/18      PT LONG TERM GOAL #5   Title  Lt knee AROM 0-105 for improved function and mobility    Status  New    Target Date  10/23/18  Plan - 09/07/18 1655    Clinical Impression Statement  Pt able to tolerate increased step height of stairs and also increased reps of sit to/from stand.  Progressing well each visit. Pt reporting increased Rt low back pain with LLE exercises; TENS utilized to decrease pain at end of session.      Rehab Potential  Good    PT Frequency  3x / week    PT Duration  4 weeks    PT Treatment/Interventions  ADLs/Self Care Home Management;Cryotherapy;Ultrasound;Moist Heat;Electrical Stimulation;Gait training;Stair training;Iontophoresis 62m/ml  Dexamethasone;Neuromuscular re-education;Balance training;Functional mobility training;Therapeutic exercise;Therapeutic activities;Patient/family education;Manual techniques;Dry needling;Passive range of motion;Scar mobilization;Compression bandaging;Taping;Vasopneumatic Device    PT Next Visit Plan  continue aggressive ROM, functional strengthening, cont gait training with cane    PT Home Exercise Plan  Access Code: [email or web address]   Consulted and Agree with Plan of Care  Patient       Patient will benefit from skilled therapeutic intervention in order to improve the following deficits and impairments:  Abnormal gait, Increased fascial restricitons, Increased muscle spasms, Pain, Decreased mobility, Decreased activity tolerance, Decreased endurance, Decreased range of motion, Decreased strength, Impaired perceived functional ability, Impaired flexibility, Difficulty walking, Decreased balance  Visit Diagnosis: Acute pain of left knee  Stiffness of left knee, not elsewhere classified  Localized edema  Other abnormalities of gait and mobility     Problem List Patient Active Problem List   Diagnosis Date Noted  . Primary osteoarthritis of left knee 08/14/2018  . Primary localized osteoarthritis of right knee 05/03/2016  . Primary osteoarthritis of right knee 05/03/2016   JKerin Perna PTA 09/07/18 4:59 PM  [facility] [address] [address]Phone: [phone]  Fax:  [phone] Name: Robin RUBERTMRN: [id number] of Birth: 2Jan 08, 1952

## 2018-09-10 ENCOUNTER — Ambulatory Visit: Payer: Medicare HMO | Admitting: Physical Therapy

## 2018-09-10 DIAGNOSIS — M25562 Pain in left knee: Secondary | ICD-10-CM

## 2018-09-10 DIAGNOSIS — Z7901 Long term (current) use of anticoagulants: Secondary | ICD-10-CM | POA: Diagnosis not present

## 2018-09-10 DIAGNOSIS — R600 Localized edema: Secondary | ICD-10-CM

## 2018-09-10 DIAGNOSIS — E785 Hyperlipidemia, unspecified: Secondary | ICD-10-CM | POA: Diagnosis not present

## 2018-09-10 DIAGNOSIS — M25662 Stiffness of left knee, not elsewhere classified: Secondary | ICD-10-CM | POA: Diagnosis not present

## 2018-09-10 DIAGNOSIS — Z96659 Presence of unspecified artificial knee joint: Secondary | ICD-10-CM | POA: Diagnosis not present

## 2018-09-10 DIAGNOSIS — E039 Hypothyroidism, unspecified: Secondary | ICD-10-CM | POA: Diagnosis not present

## 2018-09-10 DIAGNOSIS — I10 Essential (primary) hypertension: Secondary | ICD-10-CM | POA: Diagnosis not present

## 2018-09-10 DIAGNOSIS — I4891 Unspecified atrial fibrillation: Secondary | ICD-10-CM | POA: Diagnosis not present

## 2018-09-10 NOTE — Therapy (Signed)
[place], [place], [address] Phone: [phone]   Fax:  [phone]  Physical Therapy Treatment  Patient Details  Name: Robin Christensen MRN: [id number] Date of Birth: Aug 11, 1950 Referring Provider (PT): Dr. Melrose Nakayama   Encounter Date: 09/10/2018  PT End of Session - 09/10/18 1024    Visit Number  6    Number of Visits  20    Date for PT Re-Evaluation  10/23/18    PT Start Time  5035    PT Stop Time  1111    PT Time Calculation (min)  53 min    Activity Tolerance  Patient tolerated treatment well    Behavior During Therapy  [facility] for tasks assessed/performed       Past Medical History:  Diagnosis Date  . Cancer [facility])    Right Breast Cancer  . Dysrhythmia    AF  . Hyperlipidemia   . Hypertension   . Hypothyroidism   . Obesity   . Pneumonia   . PONV (postoperative nausea and vomiting)   . Sleep apnea    cpcap    Past Surgical History:  Procedure Laterality Date  . BREAST SURGERY    . CARDIOVERSION N/A 06/26/2014   Procedure: CARDIOVERSION;  Surgeon: Laverda Page, MD;  Location: [place];  Service: Cardiovascular;  Laterality: N/A;  . CHOLECYSTECTOMY    . 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 TOTAL KNEE ARTHROPLASTY Left 08/14/2018   Procedure: TOTAL KNEE ARTHROPLASTY;  Surgeon: Melrose Nakayama, MD;  Location: [place];  Service: Orthopedics;  Laterality: Left;    There were no vitals filed for this visit.  Subjective Assessment - 09/10/18 1024    Subjective  Pt reports she did a lot of walking yesterday in parking lot at the big [employer] (rode scooter once inside).  "My leg was hurting really bad last night, until my pain medicine kicked".  Pt reports she has a neighbor encouraging her to get out to walk.     Patient Stated Goals  improve  pain and return to PLOF    Currently in Pain?  Yes    Pain Score  3     Pain Orientation  Left    Pain Descriptors / Indicators  Aching;Dull    Aggravating Factors   bending knee     Pain Relieving Factors  medication          [facility] PT Assessment - 09/10/18 0001      Assessment   Medical Diagnosis  Lt TKA    Referring Provider (PT)  Dr. Melrose Nakayama    Onset Date/Surgical Date  08/14/18    Next MD Visit  10/05/18       [place] Adult PT Treatment/Exercise - 09/10/18 0001      Knee/Hip Exercises: Stretches   Passive Hamstring Stretch  Left;2 reps;30 seconds    Gastroc Stretch  Left;3 reps;30 seconds      Knee/Hip Exercises: Aerobic   Recumbent Bike  Partial revolutions x 5 min for Lt knee ROM    Other Aerobic  single laps around gym in between exercises to decrease stiffness.        Knee/Hip Exercises: Standing   Heel Raises  Both;1 set;10 reps   and toe raises, UE on counter   Lateral Step Up  Left;1 set;10 reps;Hand Hold: 2;Step Height:  6"    Forward Step Up  Left;1 set;10 reps;Hand Hold: 2;Step Height: 6"    Functional Squat  1 set;5 reps   holding onto sink   SLS  Lt SLS with light UE support on counter x 10 seconds x 2 reps      Knee/Hip Exercises: Seated   Long Arc Quad  Strengthening;Left;Weights;1 set;15 reps    Long Arc Quad Weight  2 lbs.    Other Seated Knee/Hip Exercises  seated scoots x 10 sec holds x 5 reps     Other Seated Knee/Hip Exercises  heel/toe raises x 10 each    Sit to Sand  1 set;10 reps;without UE support   low black mat, with 3" pad ontop.      Moist Heat Therapy   Number Minutes Moist Heat  10 Minutes    Moist Heat Location  Lumbar Spine      Electrical Stimulation   Electrical Stimulation Location  Rt SI joint     Electrical Stimulation Action  TENS    Electrical Stimulation Parameters  intensity to pt tolerance    Electrical Stimulation Goals  Pain      Vasopneumatic   Number Minutes Vasopneumatic   10 minutes    Vasopnuematic  Location   Knee    Vasopneumatic Pressure  Low    Vasopneumatic Temperature   34 deg               PT Short Term Goals - 09/10/18 1313      PT SHORT TERM GOAL #1   Title  independent with initial HEP    Status  Achieved      PT SHORT TERM GOAL #2   Title  Lt knee AROM 0-90 for improved function and mobiltiy    Status  Partially Met    Target Date  09/25/18      PT SHORT TERM GOAL #3   Title  report pain < 5/10 with ambulation for improved pain and function    Status  On-going    Target Date  09/25/18      PT SHORT TERM GOAL #4   Title  amb > 150' with SPC modifiied independent for improved function    Status  Achieved    Target Date  09/25/18        PT Long Term Goals - 08/28/18 0923      PT LONG TERM GOAL #1   Title  independent with advanced HEP/community fitness program    Status  New    Target Date  10/23/18      PT LONG TERM GOAL #2   Title  FOTO score improved to </= 46% limited for improved function    Status  New    Target Date  10/23/18      PT LONG TERM GOAL #3   Title  report pain < 3/10 for improved function and mobility    Status  New    Target Date  10/23/18      PT LONG TERM GOAL #4   Title  amb > 300' without increase in pain or AD for improved function    Status  New    Target Date  10/23/18      PT LONG TERM GOAL #5   Title  Lt knee AROM 0-105 for improved function and mobility    Status  New    Target Date  10/23/18            Plan -  09/10/18 1044    Clinical Impression Statement  Pt ascending (forward) stairs with greater ease than last session; lateral steps are still challenging. Lt knee ROM continues to be limited. Pt continues to have pain in her Rt SI joint with LLE exercises. She has met STG#3. Progressing gradually towards goals.     Rehab Potential  Good    PT Frequency  3x / week    PT Duration  4 weeks    PT Treatment/Interventions  ADLs/Self Care Home Management;Cryotherapy;Ultrasound;Moist Heat;Electrical  Stimulation;Gait training;Stair training;Iontophoresis 76m/ml Dexamethasone;Neuromuscular re-education;Balance training;Functional mobility training;Therapeutic exercise;Therapeutic activities;Patient/family education;Manual techniques;Dry needling;Passive range of motion;Scar mobilization;Compression bandaging;Taping;Vasopneumatic Device    PT Next Visit Plan  continue aggressive ROM, functional strengthening.     PT Home Exercise Plan  Access Code: [email or web address]   Consulted and Agree with Plan of Care  Patient       Patient will benefit from skilled therapeutic intervention in order to improve the following deficits and impairments:  Abnormal gait, Increased fascial restricitons, Increased muscle spasms, Pain, Decreased mobility, Decreased activity tolerance, Decreased endurance, Decreased range of motion, Decreased strength, Impaired perceived functional ability, Impaired flexibility, Difficulty walking, Decreased balance  Visit Diagnosis: Acute pain of left knee  Stiffness of left knee, not elsewhere classified  Localized edema     Problem List Patient Active Problem List   Diagnosis Date Noted  . Primary osteoarthritis of left knee 08/14/2018  . Primary localized osteoarthritis of right knee 05/03/2016  . Primary osteoarthritis of right knee 05/03/2016   JKerin Perna PTA 09/10/18 1:14 PM  [place] [address]Phone: [phone]  Fax:  [phone] Name: Robin PICKRONMRN: [id number] of Birth: [birth year]

## 2018-09-12 ENCOUNTER — Encounter: Payer: Medicare HMO | Admitting: Physical Therapy

## 2018-09-14 ENCOUNTER — Ambulatory Visit: Payer: Medicare HMO | Admitting: Physical Therapy

## 2018-09-14 ENCOUNTER — Encounter: Payer: Self-pay | Admitting: Physical Therapy

## 2018-09-14 VITALS — BP 106/57 | HR 58

## 2018-09-14 DIAGNOSIS — R600 Localized edema: Secondary | ICD-10-CM | POA: Diagnosis not present

## 2018-09-14 DIAGNOSIS — M25562 Pain in left knee: Secondary | ICD-10-CM

## 2018-09-14 DIAGNOSIS — R2689 Other abnormalities of gait and mobility: Secondary | ICD-10-CM | POA: Diagnosis not present

## 2018-09-14 DIAGNOSIS — M25662 Stiffness of left knee, not elsewhere classified: Secondary | ICD-10-CM

## 2018-09-14 NOTE — Therapy (Signed)
[place], [place], [address] Phone: [phone]   Fax:  [phone]  Physical Therapy Treatment  Patient Details  Name: Robin Christensen MRN: [id number] Date of Birth: 07-06-1951 Referring Provider (PT): Dr. Melrose Nakayama   Encounter Date: 09/14/2018  PT End of Session - 09/14/18 1413    Visit Number  7    Number of Visits  20    Date for PT Re-Evaluation  10/23/18    PT Start Time  6767    PT Stop Time  1451    PT Time Calculation (min)  46 min    Activity Tolerance  Treatment limited secondary to medical complications (Comment);Patient limited by pain    Behavior During Therapy  [facility] for tasks assessed/performed       Past Medical History:  Diagnosis Date  . Cancer [facility])    Right Breast Cancer  . Dysrhythmia    AF  . Hyperlipidemia   . Hypertension   . Hypothyroidism   . Obesity   . Pneumonia   . PONV (postoperative nausea and vomiting)   . Sleep apnea    cpcap    Past Surgical History:  Procedure Laterality Date  . BREAST SURGERY    . CARDIOVERSION N/A 06/26/2014   Procedure: CARDIOVERSION;  Surgeon: Laverda Page, MD;  Location: [place];  Service: Cardiovascular;  Laterality: N/A;  . CHOLECYSTECTOMY    . 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 TOTAL KNEE ARTHROPLASTY Left 08/14/2018   Procedure: TOTAL KNEE ARTHROPLASTY;  Surgeon: Melrose Nakayama, MD;  Location: [place];  Service: Orthopedics;  Laterality: Left;    Vitals:   09/14/18 1409 09/14/18 1435  BP: (!) 93/53 (!) 106/57  Pulse: 60 (!) 58    Subjective Assessment - 09/14/18 1409    Subjective  Pt reports she was able to ascend 7 steps at home; but became dizzy so she came back down.  She missed last session due to sinus pain.  She is no longer ambulating with cane, but is  wearing Lt stocking due to swelling.     Patient Stated Goals  improve pain and return to PLOF    Currently in Pain?  Yes    Pain Score  3     Pain Location  Knee    Pain Orientation  Left    Pain Descriptors / Indicators  Aching    Aggravating Factors   bending knee    Pain Relieving Factors  medication, ice     Multiple Pain Sites  Yes    Pain Score  5    Pain Location  Sacrum    Pain Orientation  Right    Pain Descriptors / Indicators  Sharp    Aggravating Factors   getting up from recliner; sitting in recliner too long    Pain Relieving Factors  heating pad         [facility] PT Assessment - 09/14/18 0001      Assessment   Medical Diagnosis  Lt TKA    Referring Provider (PT)  Dr. Melrose Nakayama    Onset Date/Surgical Date  08/14/18    Next MD Visit  10/05/18      PROM   Left Knee Flexion  90   seated scoot     [facility] Adult PT Treatment/Exercise - 09/14/18 0001  Self-Care   Self-Care  Other Self-Care Comments    Other Self-Care Comments   educated pt on self massage to LE with roller stick to decrease fascial tightness and pain; pt returned demo with cues.       Lumbar Exercises: Aerobic   Nustep  --      Knee/Hip Exercises: Stretches   Gastroc Stretch  Left;3 reps;30 seconds      Knee/Hip Exercises: Aerobic   Nustep  L4: arms/legs x 5 min       Knee/Hip Exercises: Seated   Other Seated Knee/Hip Exercises  seated scoots x 10 sec holds x 5 reps     Other Seated Knee/Hip Exercises  heel/toe raises x 10 each      Knee/Hip Exercises: Supine   Quad Sets  Left;1 set;5 reps    Heel Slides  AAROM;Left;1 set;5 reps    Straight Leg Raises  Left;1 set;10 reps      Moist Heat Therapy   Number Minutes Moist Heat  10 Minutes    Moist Heat Location  Lumbar Spine      Vasopneumatic   Number Minutes Vasopneumatic   10 minutes    Vasopnuematic Location   Knee   Lt   Vasopneumatic Pressure  Low    Vasopneumatic Temperature   34 deg      Manual Therapy   Passive ROM   Lt knee into extension and flexion               PT Short Term Goals - 09/10/18 1313      PT SHORT TERM GOAL #1   Title  independent with initial HEP    Status  Achieved      PT SHORT TERM GOAL #2   Title  Lt knee AROM 0-90 for improved function and mobiltiy    Status  Partially Met    Target Date  09/25/18      PT SHORT TERM GOAL #3   Title  report pain < 5/10 with ambulation for improved pain and function    Status  On-going    Target Date  09/25/18      PT SHORT TERM GOAL #4   Title  amb > 150' with SPC modifiied independent for improved function    Status  Achieved    Target Date  09/25/18        PT Long Term Goals - 08/28/18 0923      PT LONG TERM GOAL #1   Title  independent with advanced HEP/community fitness program    Status  New    Target Date  10/23/18      PT LONG TERM GOAL #2   Title  FOTO score improved to </= 46% limited for improved function    Status  New    Target Date  10/23/18      PT LONG TERM GOAL #3   Title  report pain < 3/10 for improved function and mobility    Status  New    Target Date  10/23/18      PT LONG TERM GOAL #4   Title  amb > 300' without increase in pain or AD for improved function    Status  New    Target Date  10/23/18      PT LONG TERM GOAL #5   Title  Lt knee AROM 0-105 for improved function and mobility    Status  New    Target Date  10/23/18  Plan - 09/14/18 1459    Clinical Impression Statement  Pt able to tolerate seated exercise well, however became orthostatic with stairs.  BP measured and slightly improved in supine.  Session shorted due to pt's decreased tolerance for exercise. Pt accompanied to ride after leaving session for safety purposed.  No new goals met this date.     Rehab Potential  Good    PT Frequency  3x / week    PT Duration  4 weeks    PT Treatment/Interventions  ADLs/Self Care Home Management;Cryotherapy;Ultrasound;Moist Heat;Electrical Stimulation;Gait training;Stair  training;Iontophoresis 2m/ml Dexamethasone;Neuromuscular re-education;Balance training;Functional mobility training;Therapeutic exercise;Therapeutic activities;Patient/family education;Manual techniques;Dry needling;Passive range of motion;Scar mobilization;Compression bandaging;Taping;Vasopneumatic Device    PT Next Visit Plan  continue aggressive ROM, functional strengthening.     PT Home Exercise Plan  Access Code: [email or web address]   Consulted and Agree with Plan of Care  Patient       Patient will benefit from skilled therapeutic intervention in order to improve the following deficits and impairments:  Abnormal gait, Increased fascial restricitons, Increased muscle spasms, Pain, Decreased mobility, Decreased activity tolerance, Decreased endurance, Decreased range of motion, Decreased strength, Impaired perceived functional ability, Impaired flexibility, Difficulty walking, Decreased balance  Visit Diagnosis: Acute pain of left knee  Stiffness of left knee, not elsewhere classified  Localized edema  Other abnormalities of gait and mobility     Problem List Patient Active Problem List   Diagnosis Date Noted  . Primary osteoarthritis of left knee 08/14/2018  . Primary localized osteoarthritis of right knee 05/03/2016  . Primary osteoarthritis of right knee 05/03/2016   JKerin Perna PTA 09/14/18 3:11 PM  [facility] [place] [address]Phone: [phone]  Fax:  [phone] Name: Robin PRUNTYMRN: [id number] of Birth: 2October 26, 1952

## 2018-09-17 ENCOUNTER — Ambulatory Visit: Payer: Medicare HMO | Admitting: Physical Therapy

## 2018-09-17 VITALS — BP 131/73 | HR 70

## 2018-09-17 DIAGNOSIS — M25562 Pain in left knee: Secondary | ICD-10-CM | POA: Diagnosis not present

## 2018-09-17 DIAGNOSIS — R600 Localized edema: Secondary | ICD-10-CM | POA: Diagnosis not present

## 2018-09-17 DIAGNOSIS — R2689 Other abnormalities of gait and mobility: Secondary | ICD-10-CM

## 2018-09-17 DIAGNOSIS — M25662 Stiffness of left knee, not elsewhere classified: Secondary | ICD-10-CM

## 2018-09-17 NOTE — Therapy (Signed)
[place], [place], [address] Phone: [phone]   Fax:  [phone]  Physical Therapy Treatment  Patient Details  Name: Robin Christensen MRN: [id number] Date of Birth: 04-09-1951 Referring Provider (PT): Dr. Melrose Nakayama   Encounter Date: 09/17/2018  PT End of Session - 09/17/18 1443    Visit Number  8    Number of Visits  20    Date for PT Re-Evaluation  10/23/18    PT Start Time  1430    PT Stop Time  1523    PT Time Calculation (min)  53 min       Past Medical History:  Diagnosis Date  . Cancer [facility])    Right Breast Cancer  . Dysrhythmia    AF  . Hyperlipidemia   . Hypertension   . Hypothyroidism   . Obesity   . Pneumonia   . PONV (postoperative nausea and vomiting)   . Sleep apnea    cpcap    Past Surgical History:  Procedure Laterality Date  . BREAST SURGERY    . CARDIOVERSION N/A 06/26/2014   Procedure: CARDIOVERSION;  Surgeon: Laverda Page, MD;  Location: [place];  Service: Cardiovascular;  Laterality: N/A;  . CHOLECYSTECTOMY    . TOTAL KNEE ARTHROPLASTY Right 05/03/2016   Procedure: TOTAL KNEE ARTHROPLASTY;  Surgeon: Melrose Nakayama, MD;  Location: [place];  Service: Orthopedics;  Laterality: Right;  . TOTAL KNEE ARTHROPLASTY Left 08/14/2018   Procedure: TOTAL KNEE ARTHROPLASTY;  Surgeon: Melrose Nakayama, MD;  Location: [place];  Service: Orthopedics;  Laterality: Left;    Vitals:   09/17/18 1434  BP: 131/73  Pulse: 70    Subjective Assessment - 09/17/18 1443    Subjective  Pt reports she did some extra walking this morning, by grocery shopping (pushing cart, not riding in cart). She feels her leg is a little less swollen today.      Currently in Pain?  Yes    Pain Score  6     Pain Location  Knee    Pain Orientation  Left         [facility] PT Assessment - 09/17/18 0001      Assessment   Medical Diagnosis  Lt TKA    Referring Provider (PT)  Dr. Melrose Nakayama    Onset Date/Surgical Date  08/14/18    Next MD Visit  10/05/18      PROM   Left Knee Flexion  97   during seated scoots.      [place] Adult PT Treatment/Exercise - 09/17/18 0001      Knee/Hip Exercises: Stretches   Passive Hamstring Stretch  Left;3 reps;30 seconds      Knee/Hip Exercises: Aerobic   Nustep  L4: arms/legs x 6 min       Knee/Hip Exercises: Standing   Heel Raises  Both;1 set;10 reps    Hip Abduction  Stengthening;Right;Left;1 set;10 reps;Knee straight    Lateral Step Up  Left;1 set;10 reps;Hand Hold: 2;Step Height: 6"    Forward Step Up  Left;1 set;10 reps;Hand Hold: 2;Step Height: 6"    Functional Squat  1 set;5 reps   holding onto sink, 2 sets   Gait Training  320 ft without AD and supervision; VC for increased Rt step length, increased Lt toe off, increased Lt heel strike.  Improved with cues and increased distance.       Knee/Hip Exercises: Seated   Long Arc Quad  Strengthening;1 set;10  reps    Long Arc [employer]  --   2.5#   Other Seated Knee/Hip Exercises  seated scoots x 10 sec holds x 5 reps     Other Seated Knee/Hip Exercises  heel/toe raises x 10 each      Knee/Hip Exercises: Supine   Quad Sets  Left;5 reps    Heel Slides  AROM;Left   3 reps     Moist Heat Therapy   Number Minutes Moist Heat  --    Moist Heat Location  --      Vasopneumatic   Number Minutes Vasopneumatic   10 minutes    Vasopnuematic Location   Knee   Lt   Vasopneumatic Pressure  Low    Vasopneumatic Temperature   34 deg               PT Short Term Goals - 09/10/18 1313      PT SHORT TERM GOAL #1   Title  independent with initial HEP    Status  Achieved      PT SHORT TERM GOAL #2   Title  Lt knee AROM 0-90 for improved function and mobiltiy    Status  Partially Met    Target Date  09/25/18      PT SHORT TERM GOAL #3   Title  report pain < 5/10 with ambulation for improved pain and function    Status  On-going    Target Date  09/25/18      PT  SHORT TERM GOAL #4   Title  amb > 150' with [place] modifiied independent for improved function    Status  Achieved    Target Date  09/25/18        PT Long Term Goals - 08/28/18 0923      PT LONG TERM GOAL #1   Title  independent with advanced HEP/community fitness program    Status  New    Target Date  10/23/18      PT LONG TERM GOAL #2   Title  FOTO score improved to </= 46% limited for improved function    Status  New    Target Date  10/23/18      PT LONG TERM GOAL #3   Title  report pain < 3/10 for improved function and mobility    Status  New    Target Date  10/23/18      PT LONG TERM GOAL #4   Title  amb > 300' without increase in pain or AD for improved function    Status  New    Target Date  10/23/18      PT LONG TERM GOAL #5   Title  Lt knee AROM 0-105 for improved function and mobility    Status  New    Target Date  10/23/18            Plan - 09/17/18 1502    Clinical Impression Statement  Much improved exercise tolerance and BP reading.  Pt participated well, despite high reported pain level. Pt demonstrated improved knee flexion.  Pt progressing gradually towards goals.        Patient will benefit from skilled therapeutic intervention in order to improve the following deficits and impairments:     Visit Diagnosis: Acute pain of left knee  Stiffness of left knee, not elsewhere classified  Localized edema  Other abnormalities of gait and mobility     Problem List Patient Active Problem List   Diagnosis  Date Noted  . Primary osteoarthritis of left knee 08/14/2018  . Primary localized osteoarthritis of right knee 05/03/2016  . Primary osteoarthritis of right knee 05/03/2016   Kerin Perna, PTA 09/17/18 5:06 PM  [place], [place], [address] Phone: [phone]   Fax:  [phone]  Name: MARCAYLA BUDGE MRN: [id number] Date of Birth:  07-03-1951

## 2018-09-19 ENCOUNTER — Ambulatory Visit: Payer: Medicare HMO | Admitting: Physical Therapy

## 2018-09-19 VITALS — BP 140/79 | HR 65

## 2018-09-19 DIAGNOSIS — R600 Localized edema: Secondary | ICD-10-CM | POA: Diagnosis not present

## 2018-09-19 DIAGNOSIS — R2689 Other abnormalities of gait and mobility: Secondary | ICD-10-CM | POA: Diagnosis not present

## 2018-09-19 DIAGNOSIS — M25562 Pain in left knee: Secondary | ICD-10-CM

## 2018-09-19 DIAGNOSIS — M25662 Stiffness of left knee, not elsewhere classified: Secondary | ICD-10-CM

## 2018-09-19 NOTE — Therapy (Signed)
[place], [place], [address] Phone: [phone]   Fax:  [phone]  Physical Therapy Treatment  Patient Details  Name: Robin Christensen MRN: [id number] Date of Birth: March 28, 1951 Referring Provider (PT): Dr. Melrose Nakayama   Encounter Date: 09/19/2018  PT End of Session - 09/19/18 1447    Visit Number  9    Number of Visits  20    Date for PT Re-Evaluation  10/23/18    PT Start Time  1436   pt arrived late   PT Stop Time  1524    PT Time Calculation (min)  48 min    Activity Tolerance  Patient tolerated treatment well    Behavior During Therapy  [facility] for tasks assessed/performed       Past Medical History:  Diagnosis Date  . Cancer [facility])    Right Breast Cancer  . Dysrhythmia    AF  . Hyperlipidemia   . Hypertension   . Hypothyroidism   . Obesity   . Pneumonia   . PONV (postoperative nausea and vomiting)   . Sleep apnea    cpcap    Past Surgical History:  Procedure Laterality Date  . BREAST SURGERY    . CARDIOVERSION N/A 06/26/2014   Procedure: CARDIOVERSION;  Surgeon: Laverda Page, MD;  Location: [place];  Service: Cardiovascular;  Laterality: N/A;  . CHOLECYSTECTOMY    . 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 TOTAL KNEE ARTHROPLASTY Left 08/14/2018   Procedure: TOTAL KNEE ARTHROPLASTY;  Surgeon: Melrose Nakayama, MD;  Location: [place];  Service: Orthopedics;  Laterality: Left;    Vitals:   09/19/18 1442  BP: 140/79  Pulse: 65    Subjective Assessment - 09/19/18 1443    Subjective  Pt reports she has been working on stairs and squats at home.  She is hoping to start driving soon; released to drive (per pt) if not on pain medcation.     Patient Stated Goals  improve pain and return to PLOF    Currently in Pain?  Yes    Pain Score  6      Pain Location  Knee    Pain Orientation  Left    Pain Descriptors / Indicators  Aching;Dull    Aggravating Factors   bending knee     Pain Relieving Factors  ice, medication          [facility] PT Assessment - 09/19/18 0001      Assessment   Medical Diagnosis  Lt TKA    Referring Provider (PT)  Dr. Melrose Nakayama    Onset Date/Surgical Date  08/14/18    Next MD Visit  10/05/18      PROM   Left Knee Flexion  100   during partial revolutions on bike.       [place] Adult PT Treatment/Exercise - 09/19/18 0001      Lumbar Exercises: Aerobic   Recumbent Bike  partial revolutions for ROM x 6 min       Knee/Hip Exercises: Stretches   Gastroc Stretch  Left;3 reps;30 seconds      Knee/Hip Exercises: Aerobic   Other Aerobic  single laps around gym in between exercises to decrease stiffness.        Knee/Hip Exercises: Standing   Forward Step Up  Left;1 set;5 reps;Hand Hold: 1;Step Height: 8"  Step Down  Right;1 set;10 reps - 4" and retro step up.    Wall Squat  2 sets;5 reps   limited depth   Stairs  reciprical pattern on 4 and 6" step and bilat UE support x 5 steps     SLS  Lt SLS without UE support x 25 sec      Knee/Hip Exercises: Seated   Other Seated Knee/Hip Exercises  seated scoots x 10 sec holds x 3 reps     Other Seated Knee/Hip Exercises  PF,with knee flex/ext with black band x 10     Hamstring Curl  Left;1 set;10 reps   green band   Sit to Sand  --   3 reps, to simulate getting in/out of car     Vasopneumatic   Number Minutes Vasopneumatic   10 minutes    Vasopnuematic Location   Knee   Lt   Vasopneumatic Pressure  Low    Vasopneumatic Temperature   34 deg               PT Short Term Goals - 09/10/18 1313      PT SHORT TERM GOAL #1   Title  independent with initial HEP    Status  Achieved      PT SHORT TERM GOAL #2   Title  Lt knee AROM 0-90 for improved function and mobiltiy    Status  Partially Met    Target Date  09/25/18      PT SHORT TERM  GOAL #3   Title  report pain < 5/10 with ambulation for improved pain and function    Status  On-going    Target Date  09/25/18      PT SHORT TERM GOAL #4   Title  amb > 150' with SPC modifiied independent for improved function    Status  Achieved    Target Date  09/25/18        PT Long Term Goals - 08/28/18 0923      PT LONG TERM GOAL #1   Title  independent with advanced HEP/community fitness program    Status  New    Target Date  10/23/18      PT LONG TERM GOAL #2   Title  FOTO score improved to </= 46% limited for improved function    Status  New    Target Date  10/23/18      PT LONG TERM GOAL #3   Title  report pain < 3/10 for improved function and mobility    Status  New    Target Date  10/23/18      PT LONG TERM GOAL #4   Title  amb > 300' without increase in pain or AD for improved function    Status  New    Target Date  10/23/18      PT LONG TERM GOAL #5   Title  Lt knee AROM 0-105 for improved function and mobility    Status  New    Target Date  10/23/18            Plan - 09/19/18 1515    Clinical Impression Statement  Pt able to complete SLS for 25 sec without UE support and able to ascend 8" step x 5 reps.  Pt making good gains towards remaining goals.     Rehab Potential  Good    PT Frequency  3x / week    PT Duration  4 weeks  PT Treatment/Interventions  ADLs/Self Care Home Management;Cryotherapy;Ultrasound;Moist Heat;Electrical Stimulation;Gait training;Stair training;Iontophoresis 67m/ml Dexamethasone;Neuromuscular re-education;Balance training;Functional mobility training;Therapeutic exercise;Therapeutic activities;Patient/family education;Manual techniques;Dry needling;Passive range of motion;Scar mobilization;Compression bandaging;Taping;Vasopneumatic Device    PT Next Visit Plan  10th visit.      PT Home Exercise Plan  Access Code: [email or web address]   Consulted and Agree with Plan of Care  Patient       Patient will benefit from skilled  therapeutic intervention in order to improve the following deficits and impairments:  Abnormal gait, Increased fascial restricitons, Increased muscle spasms, Pain, Decreased mobility, Decreased activity tolerance, Decreased endurance, Decreased range of motion, Decreased strength, Impaired perceived functional ability, Impaired flexibility, Difficulty walking, Decreased balance  Visit Diagnosis: Acute pain of left knee  Stiffness of left knee, not elsewhere classified  Localized edema  Other abnormalities of gait and mobility     Problem List Patient Active Problem List   Diagnosis Date Noted  . Primary osteoarthritis of left knee 08/14/2018  . Primary localized osteoarthritis of right knee 05/03/2016  . Primary osteoarthritis of right knee 05/03/2016   JKerin Perna PTA 09/19/18 3:26 PM  [place] [address]Phone: [phone]  Fax:  [phone] Name: Robin GOSSERMRN: [id number] of Birth: [birth year]

## 2018-09-21 ENCOUNTER — Ambulatory Visit: Payer: Medicare HMO | Admitting: Physical Therapy

## 2018-09-21 DIAGNOSIS — M25562 Pain in left knee: Secondary | ICD-10-CM

## 2018-09-21 DIAGNOSIS — R2689 Other abnormalities of gait and mobility: Secondary | ICD-10-CM

## 2018-09-21 DIAGNOSIS — R600 Localized edema: Secondary | ICD-10-CM | POA: Diagnosis not present

## 2018-09-21 DIAGNOSIS — M25662 Stiffness of left knee, not elsewhere classified: Secondary | ICD-10-CM | POA: Diagnosis not present

## 2018-09-21 NOTE — Therapy (Signed)
Progress Note Reporting Period 08/28/2018 to 09/21/2018  See note below for Objective Data and Assessment of Progress/Goals.       [place], [place], [address] Phone: [phone]   Fax:  [phone]  Physical Therapy Treatment  Patient Details  Name: Robin Christensen MRN: [id number] Date of Birth: 12-01-1950 Referring Provider (PT): Dr. Melrose Nakayama   Encounter Date: 09/21/2018  PT End of Session - 09/21/18 1458    Visit Number  10    Number of Visits  20    Date for PT Re-Evaluation  10/23/18    PT Start Time  1401    PT Stop Time  1455    PT Time Calculation (min)  54 min    Activity Tolerance  Patient tolerated treatment well    Behavior During Therapy  [facility] for tasks assessed/performed       Past Medical History:  Diagnosis Date  . Cancer [facility])    Right Breast Cancer  . Dysrhythmia    AF  . Hyperlipidemia   . Hypertension   . Hypothyroidism   . Obesity   . Pneumonia   . PONV (postoperative nausea and vomiting)   . Sleep apnea    cpcap    Past Surgical History:  Procedure Laterality Date  . BREAST SURGERY    . CARDIOVERSION N/A 06/26/2014   Procedure: CARDIOVERSION;  Surgeon: Laverda Page, MD;  Location: [place];  Service: Cardiovascular;  Laterality: N/A;  . CHOLECYSTECTOMY    . 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 TOTAL KNEE ARTHROPLASTY Left 08/14/2018   Procedure: TOTAL KNEE ARTHROPLASTY;  Surgeon: Melrose Nakayama, MD;  Location: [place];  Service: Orthopedics;  Laterality: Left;    There were no vitals filed for this visit.  Subjective Assessment - 09/21/18 1408    Subjective  Pat reports she hasn't had any pain medicine since midnight.  She drove herself to rehab today.      Currently in Pain?  Yes    Pain Score  3      Pain Location  Knee    Pain Orientation  Left    Pain Descriptors / Indicators  Aching    Aggravating Factors   bending knee past comfortable point.     Pain Relieving Factors  ice, medication         [facility] PT Assessment - 09/21/18 0001      Assessment   Medical Diagnosis  Lt TKA    Referring Provider (PT)  Dr. Melrose Nakayama    Onset Date/Surgical Date  08/14/18    Next MD Visit  10/05/18      Observation/Other Assessments   Focus on Therapeutic Outcomes (FOTO)   47% limited      PROM   Left Knee Flexion  100       [facility] Adult PT Treatment/Exercise - 09/21/18 0001      Lumbar Exercises: Aerobic   Recumbent Bike  --      Knee/Hip Exercises: Stretches   Passive Hamstring Stretch  Left;4 reps   supine with strap   Gastroc Stretch  Left;3 reps;30 seconds      Knee/Hip Exercises: Aerobic   Tread Mill  incline 2-3 @ 1.0- mph x 1.5 min;  decline 4, @ 1.1 mph x 2 min     Recumbent Bike  partial to full revolutions for ROM x 6 min     Other Aerobic  single laps around gym in between exercises to decrease stiffness.   320 ft without AD, for meeting LTG#      Knee/Hip Exercises: Standing   Heel Raises  Both;1 set;10 reps    Forward Step Up  Left;1 set;5 reps;Hand Hold: 1;Step Height: 8"    Wall Squat  2 sets;5 reps   limited depth   Stairs  reciprical pattern on 4 and 6" step and bilat UE support x 5 steps x 2 reps    SLS  Lt SLS on blue pad with light UE support  x 15 sec x 2 reps     Other Standing Knee Exercises  forward lunge onto 12" step for flexion ROM, x 20 sec x 3 reps     Other Standing Knee Exercises  step forward/backward with red band to simulate using vacuum       Vasopneumatic   Number Minutes Vasopneumatic   10 minutes    Vasopnuematic Location   Knee   Lt   Vasopneumatic Pressure  Low    Vasopneumatic Temperature   34 deg               PT Short Term Goals - 09/10/18 1313      PT SHORT TERM GOAL #1   Title  independent with initial HEP     Status  Achieved      PT SHORT TERM GOAL #2   Title  Lt knee AROM 0-90 for improved function and mobiltiy    Status  Partially Met    Target Date  09/25/18      PT SHORT TERM GOAL #3   Title  report pain < 5/10 with ambulation for improved pain and function    Status  On-going    Target Date  09/25/18      PT SHORT TERM GOAL #4   Title  amb > 150' with SPC modifiied independent for improved function    Status  Achieved    Target Date  09/25/18        PT Long Term Goals - 09/21/18 1506      PT LONG TERM GOAL #1   Title  independent with advanced HEP/community fitness program    Time  4    Period  Weeks    Status  On-going      PT LONG TERM GOAL #2   Title  FOTO score improved to </= 46% limited for improved function    Time  4    Period  Weeks    Status  On-going      PT LONG TERM GOAL #3   Title  report pain < 3/10 for improved function and mobility    Baseline  3/6:  pain up to 4-5/10    Period  Weeks    Status  On-going      PT LONG TERM GOAL #4   Title  amb > 300' without increase in pain or AD for improved function    Time  4    Period  Weeks    Status  Achieved      PT LONG TERM GOAL #5   Title  Lt knee AROM 0-105 for improved function and mobility    Baseline  3/6: Lt knee flexion 100 deg    Time  4    Period  Weeks    Status  Partially Met  Plan - 09/21/18 1504    Clinical Impression Statement  Pt's FOTO score improved to 47%, near meeting goal.  Pt's knee flexion ROM remains 100 deg.  Pt reported slight increase in pain with ROM exercises, but no increase in pain with ambulating 300 ft.  Pt has partially met her goals and verbalized readiness to d/c to HEP after next visit.      Personal Factors and Comorbidities  Comorbidity 3+    Rehab Potential  Good    PT Frequency  3x / week    PT Duration  4 weeks    PT Treatment/Interventions  ADLs/Self Care Home Management;Cryotherapy;Ultrasound;Moist Heat;Electrical Stimulation;Gait  training;Stair training;Iontophoresis 69m/ml Dexamethasone;Neuromuscular re-education;Balance training;Functional mobility training;Therapeutic exercise;Therapeutic activities;Patient/family education;Manual techniques;Dry needling;Passive range of motion;Scar mobilization;Compression bandaging;Taping;Vasopneumatic Device    PT Next Visit Plan  assess readiness to d/c and finalize HEP.     Consulted and Agree with Plan of Care  Patient       Patient will benefit from skilled therapeutic intervention in order to improve the following deficits and impairments:  Abnormal gait, Increased fascial restricitons, Increased muscle spasms, Pain, Decreased mobility, Decreased activity tolerance, Decreased endurance, Decreased range of motion, Decreased strength, Impaired perceived functional ability, Impaired flexibility, Difficulty walking, Decreased balance  Visit Diagnosis: Acute pain of left knee  Stiffness of left knee, not elsewhere classified  Localized edema  Other abnormalities of gait and mobility     Problem List Patient Active Problem List   Diagnosis Date Noted  . Primary osteoarthritis of left knee 08/14/2018  . Primary localized osteoarthritis of right knee 05/03/2016  . Primary osteoarthritis of right knee 05/03/2016   JKerin Perna PTA 09/21/18 3:11 PM  Celyn P. HHelene KelpPT, MPH 09/21/18 4:30 PM    [facility] [address] [address]Phone: [phone]  Fax:  [phone] Name: PMAHOGONY GILCHRESTMRN: [id number] of Birth: [birth year]

## 2018-09-26 ENCOUNTER — Ambulatory Visit: Payer: Medicare HMO | Admitting: Physical Therapy

## 2018-09-26 ENCOUNTER — Other Ambulatory Visit: Payer: Self-pay

## 2018-09-26 ENCOUNTER — Encounter: Payer: Self-pay | Admitting: Physical Therapy

## 2018-09-26 DIAGNOSIS — R600 Localized edema: Secondary | ICD-10-CM

## 2018-09-26 DIAGNOSIS — M25562 Pain in left knee: Secondary | ICD-10-CM

## 2018-09-26 DIAGNOSIS — M25662 Stiffness of left knee, not elsewhere classified: Secondary | ICD-10-CM

## 2018-09-26 DIAGNOSIS — R2689 Other abnormalities of gait and mobility: Secondary | ICD-10-CM

## 2018-09-26 NOTE — Therapy (Addendum)
[place] [facility] [place], [place], [address] Phone: [phone]   Fax:  [phone]  Physical Therapy Treatment/Discharge  Patient Details  Name: Robin Christensen MRN: [id number] Date of Birth: 09-01-1950 Referring Provider (PT): Dr. Melrose Nakayama   Encounter Date: 09/26/2018  PT End of Session - 09/26/18 1520    Visit Number  11    Number of Visits  20    Date for PT Re-Evaluation  10/23/18    PT Start Time  1518    PT Stop Time  1602    PT Time Calculation (min)  44 min    Activity Tolerance  Patient tolerated treatment well    Behavior During Therapy  [facility] for tasks assessed/performed       Past Medical History:  Diagnosis Date  . Cancer [facility])    Right Breast Cancer  . Dysrhythmia    AF  . Hyperlipidemia   . Hypertension   . Hypothyroidism   . Obesity   . Pneumonia   . PONV (postoperative nausea and vomiting)   . Sleep apnea    cpcap    Past Surgical History:  Procedure Laterality Date  . BREAST SURGERY    . CARDIOVERSION N/A 06/26/2014   Procedure: CARDIOVERSION;  Surgeon: Laverda Page, MD;  Location: [place];  Service: Cardiovascular;  Laterality: N/A;  . CHOLECYSTECTOMY    . 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 TOTAL KNEE ARTHROPLASTY Left 08/14/2018   Procedure: TOTAL KNEE ARTHROPLASTY;  Surgeon: Melrose Nakayama, MD;  Location: [place];  Service: Orthopedics;  Laterality: Left;    There were no vitals filed for this visit.  Subjective Assessment - 09/26/18 1521    Subjective  Pt reports she has walked 10 min solid every day since last visit. She vacuumed for house for first time on Saturday; Sunday her Rt back began to bother her again (it has since resolved)    Patient Stated Goals  improve pain and return to PLOF    Currently in Pain?  Yes     Pain Score  3     Pain Location  Knee    Pain Orientation  Left    Pain Descriptors / Indicators  Aching    Aggravating Factors   bending knee past comfortable point    Pain Relieving Factors  ice, medication         [facility] PT Assessment - 09/26/18 0001      Assessment   Medical Diagnosis  Lt TKA    Referring Provider (PT)  Dr. Melrose Nakayama    Onset Date/Surgical Date  08/14/18    Next MD Visit  10/05/18      Observation/Other Assessments   Focus on Therapeutic Outcomes (FOTO)   47% limited      PROM   PROM Assessment Site  Knee    Right/Left Knee  Left    Left Knee Extension  -4    Left Knee Flexion  103       [facility] Adult PT Treatment/Exercise - 09/26/18 0001      Knee/Hip Exercises: Stretches   Passive Hamstring Stretch  Left;4 reps   supine with strap   Gastroc Stretch  3 reps;30 seconds;Both   incline board     Knee/Hip Exercises: Aerobic   Tread Mill  incline 2-3 @ 1.0- mph x 2 min;  decline 4, @ 1.1 mph x 2 min     Recumbent Bike  partial to full retro revolutions for ROM x 8 min     Other Aerobic  single laps around gym in between exercises to decrease stiffness.      Knee/Hip Exercises: Standing   Forward Step Up  Left;1 set;10 reps;Hand Hold: 2;Step Height: 6"    Step Down  Right;1 set;5 reps;Hand Hold: 2;Step Height: 6"    Stairs  reciprical pattern on 4 and 6" step and bilat UE support x 5 steps x 2 reps      Knee/Hip Exercises: Seated   Long Arc Quad  Left;2 sets;10 reps    Other Seated Knee/Hip Exercises  seated scoots x 10 sec hold x 1 rep      Knee/Hip Exercises: Supine   Quad Sets  Left;5 reps      Vasopneumatic   Number Minutes Vasopneumatic   10 minutes    Vasopnuematic Location   Knee   Lt   Vasopneumatic Pressure  Low    Vasopneumatic Temperature   34 deg         PT Short Term Goals - 09/26/18 1555      PT SHORT TERM GOAL #1   Title  independent with initial HEP    Status  Achieved      PT SHORT TERM GOAL #2   Title  Lt  knee AROM 0-90 for improved function and mobiltiy    Status  Partially Met      PT SHORT TERM GOAL #3   Title  report pain < 5/10 with ambulation for improved pain and function    Status  Achieved      PT SHORT TERM GOAL #4   Title  amb > 150' with SPC modifiied independent for improved function    Status  Achieved        PT Long Term Goals - 09/26/18 1556      PT LONG TERM GOAL #1   Title  independent with advanced HEP/community fitness program    Time  4    Status  On-going      PT LONG TERM GOAL #2   Title  FOTO score improved to </= 46% limited for improved function    Time  4    Period  Weeks    Status  On-going   near meeting, 47%     PT LONG TERM GOAL #3   Title  report pain < 3/10 for improved function and mobility    Period  Weeks    Status  Partially Met   dependent on activity     PT LONG TERM GOAL #4   Title  amb > 300' without increase in pain or AD for improved function    Time  4    Period  Weeks    Status  Achieved      PT LONG TERM GOAL #5   Title  Lt knee AROM 0-105 for improved function and mobility    Status  On-going            Plan - 09/26/18 1610    Clinical Impression Statement  Pt demonstrated a few extra degrees of Lt knee flexion ROM, but also had less ext ROM. Pt tolerated all exercises with mild increase in pain.  Pt has partially met her goals and verbalized desire to hold therapy while she works on [employer].      Rehab Potential  Good  PT Frequency  3x / week    PT Duration  4 weeks    PT Treatment/Interventions  ADLs/Self Care Home Management;Cryotherapy;Ultrasound;Moist Heat;Electrical Stimulation;Gait training;Stair training;Iontophoresis 42m/ml Dexamethasone;Neuromuscular re-education;Balance training;Functional mobility training;Therapeutic exercise;Therapeutic activities;Patient/family education;Manual techniques;Dry needling;Passive range of motion;Scar mobilization;Compression bandaging;Taping;Vasopneumatic Device    PT Next  Visit Plan  Will hold therapy until 4/1 per pt request.      PT Home Exercise Plan  Access Code: [email or web address]    Consulted and Agree with Plan of Care  Patient       Patient will benefit from skilled therapeutic intervention in order to improve the following deficits and impairments:  Abnormal gait, Increased fascial restricitons, Increased muscle spasms, Pain, Decreased mobility, Decreased activity tolerance, Decreased endurance, Decreased range of motion, Decreased strength, Impaired perceived functional ability, Impaired flexibility, Difficulty walking, Decreased balance  Visit Diagnosis: Acute pain of left knee  Stiffness of left knee, not elsewhere classified  Localized edema  Other abnormalities of gait and mobility     Problem List Patient Active Problem List   Diagnosis Date Noted  . Primary osteoarthritis of left knee 08/14/2018  . Primary localized osteoarthritis of right knee 05/03/2016  . Primary osteoarthritis of right knee 05/03/2016   JKerin Perna PTA 09/26/18 4:12 PM  [facility] [address] [address] [address]Phone: [phone]  Fax:  [phone] Name: PDARNEISHA WINDHORSTMRN: [id number] of Birth: 202/10/1950    PHYSICAL THERAPY DISCHARGE SUMMARY  Visits from Start of Care: 11  Current functional level related to goals / functional outcomes: See above   Remaining deficits: See above   Education / Equipment: HEP  Plan: Patient agrees to discharge.  Patient goals were partially met. Patient is being discharged due to being pleased with the current functional level.  ?????     SLaureen Abrahams PT, DPT 10/31/18 11:46 AM  [facility] [address] [place] [address] [phone](office) [phone](fax)

## 2018-09-27 DIAGNOSIS — H10411 Chronic giant papillary conjunctivitis, right eye: Secondary | ICD-10-CM | POA: Diagnosis not present

## 2018-10-05 DIAGNOSIS — Z96653 Presence of artificial knee joint, bilateral: Secondary | ICD-10-CM | POA: Diagnosis not present

## 2018-10-29 ENCOUNTER — Other Ambulatory Visit: Payer: Self-pay | Admitting: Cardiology

## 2018-11-05 DIAGNOSIS — Z96653 Presence of artificial knee joint, bilateral: Secondary | ICD-10-CM | POA: Diagnosis not present

## 2018-11-09 ENCOUNTER — Other Ambulatory Visit: Payer: Self-pay | Admitting: Neurology

## 2018-11-09 DIAGNOSIS — G44219 Episodic tension-type headache, not intractable: Secondary | ICD-10-CM

## 2018-11-14 DIAGNOSIS — M545 Low back pain: Secondary | ICD-10-CM | POA: Diagnosis not present

## 2018-11-23 ENCOUNTER — Other Ambulatory Visit: Payer: Self-pay | Admitting: Orthopaedic Surgery

## 2018-11-23 DIAGNOSIS — M545 Low back pain, unspecified: Secondary | ICD-10-CM

## 2018-12-04 ENCOUNTER — Ambulatory Visit: Payer: Self-pay | Admitting: Cardiology

## 2018-12-05 DIAGNOSIS — G4733 Obstructive sleep apnea (adult) (pediatric): Secondary | ICD-10-CM | POA: Diagnosis not present

## 2018-12-12 ENCOUNTER — Other Ambulatory Visit: Payer: Self-pay

## 2018-12-12 ENCOUNTER — Ambulatory Visit
Admission: RE | Admit: 2018-12-12 | Discharge: 2018-12-12 | Disposition: A | Discharge status: Home / Self Care | Payer: Medicare HMO | Source: Ambulatory Visit | Attending: Orthopaedic Surgery | Admitting: Orthopaedic Surgery

## 2018-12-12 DIAGNOSIS — M545 Low back pain, unspecified: Secondary | ICD-10-CM

## 2018-12-24 DIAGNOSIS — M533 Sacrococcygeal disorders, not elsewhere classified: Secondary | ICD-10-CM | POA: Diagnosis not present

## 2018-12-24 DIAGNOSIS — M545 Low back pain: Secondary | ICD-10-CM | POA: Diagnosis not present

## 2018-12-27 ENCOUNTER — Encounter: Payer: Self-pay | Admitting: Neurology

## 2018-12-27 ENCOUNTER — Ambulatory Visit: Payer: Medicare HMO | Admitting: Physical Therapy

## 2018-12-27 ENCOUNTER — Other Ambulatory Visit: Payer: Self-pay

## 2018-12-27 DIAGNOSIS — G8929 Other chronic pain: Secondary | ICD-10-CM | POA: Diagnosis not present

## 2018-12-27 DIAGNOSIS — M6281 Muscle weakness (generalized): Secondary | ICD-10-CM

## 2018-12-27 DIAGNOSIS — M5441 Lumbago with sciatica, right side: Secondary | ICD-10-CM

## 2018-12-27 NOTE — Therapy (Signed)
[place], [place], [address] Phone: [phone]   Fax:  [phone]  Physical Therapy Evaluation  Patient Details  Name: Robin Christensen MRN: [id number] Date of Birth: [birth year] Referring Provider (PT): Melrose Nakayama, MD   Encounter Date: 12/27/2018  PT End of Session - 12/27/18 1507    Visit Number  1    Number of Visits  12    Date for PT Re-Evaluation  02/07/19    PT Start Time  4431    PT Stop Time  1505    PT Time Calculation (min)  40 min    Activity Tolerance  Patient tolerated treatment well    Behavior During Therapy  [facility] for tasks assessed/performed       Past Medical History:  Diagnosis Date  . Cancer [facility])    Right Breast Cancer  . Dysrhythmia    AF  . Hyperlipidemia   . Hypertension   . Hypothyroidism   . Obesity   . Pneumonia   . PONV (postoperative nausea and vomiting)   . Sleep apnea    cpcap    Past Surgical History:  Procedure Laterality Date  . BREAST SURGERY    . CARDIOVERSION N/A 06/26/2014   Procedure: CARDIOVERSION;  Surgeon: Laverda Page, MD;  Location: [place];  Service: Cardiovascular;  Laterality: N/A;  . CHOLECYSTECTOMY    . TOTAL KNEE ARTHROPLASTY Right 05/03/2016   Procedure: TOTAL KNEE ARTHROPLASTY;  Surgeon: Melrose Nakayama, MD;  Location: [place];  Service: Orthopedics;  Laterality: Right;  . TOTAL KNEE ARTHROPLASTY Left 08/14/2018   Procedure: TOTAL KNEE ARTHROPLASTY;  Surgeon: Melrose Nakayama, MD;  Location: [place];  Service: Orthopedics;  Laterality: Left;    There were no vitals filed for this visit.   Subjective Assessment - 12/27/18 1430    Subjective  Pt is a 68 y/o female who returns to OPPT for Rt back, SI and buttock pain.  Pt then had TKA, and since coming off pain medications feel pain in glutes and hip has continued to get worse.  Pt returns for additonal therapy to help with pain.    Diagnostic tests  MRI: Disc  degeneration at L2-3 to L4-5    Patient Stated Goals  improve pain, return to walking    Currently in Pain?  Yes    Pain Score  3    up to 10/10; at best 3/10   Pain Location  Buttocks    Pain Orientation  Right    Pain Descriptors / Indicators  Constant;Sharp   severe   Pain Type  Acute pain;Chronic pain    Pain Onset  More than a month ago    Pain Frequency  Constant    Aggravating Factors   sleeping    Pain Relieving Factors  prednisone, pain medication         [facility] PT Assessment - 12/27/18 1435      Assessment   Medical Diagnosis  Rt SI joint and Leg pain    Referring Provider (PT)  Melrose Nakayama, MD    Onset Date/Surgical Date  --   April 2020   Hand Dominance  Right    Next MD Visit  01/28/2019    Prior Therapy  many times at this clinic before      Precautions   Precautions  None      Restrictions   Weight Bearing Restrictions  No      Balance Screen   Has  the patient fallen in the past 6 months  No    Has the patient had a decrease in activity level because of a fear of falling?   No    Is the patient reluctant to leave their home because of a fear of falling?   No      Home Environment   Living Environment  Private residence    Living Arrangements  Alone    Available Help at Discharge  Neighbor    Type of [place] Access  Level entry    [place]  Two level;Able to live on main level with bedroom/bathroom      Prior Function   Level of Independence  Independent    Vocation  Retired    Leisure  walking      Observation/Other Assessments   Focus on Therapeutic Outcomes (FOTO)   47 (53% limited; predicted 45% limited)      Posture/Postural Control   Posture/Postural Control  Postural limitations    Postural Limitations  Rounded Shoulders;Forward head      ROM / Strength   AROM / PROM / Strength  AROM;Strength      AROM   AROM Assessment Site  Lumbar    Lumbar Flexion  limited 25% with improvement in symptoms on 2nd rep    Lumbar  Extension  WNL with pain    Lumbar - Right Side Bend  WNL with pain    Lumbar - Left Side Bend  WNL with increased pain to Rt     Lumbar - Right Rotation  WNL    Lumbar - Left Rotation  WNL      Strength   Strength Assessment Site  Hip;Knee;Ankle    Right/Left Hip  Right    Right Hip Flexion  3+/5    Right Hip External Rotation   3/5    Right Hip Internal Rotation  4/5    Right Hip ABduction  3/5    Right/Left Knee  Right    Right Knee Flexion  4/5      Flexibility   Soft Tissue Assessment /Muscle Length  yes    Hamstrings  tightness bil    Piriformis  tightness bil      Palpation   Palpation comment  active trigger points in glutes (likely piriformis) and into hamstrings on Rt      Special Tests    Special Tests  Lumbar    Lumbar Tests  Slump Test;Straight Leg Raise      Slump test   Findings  Positive    Side  Right      Straight Leg Raise   Findings  Positive    Side   Right                Objective measurements completed on examination: See above findings.      [facility] Adult PT Treatment/Exercise - 12/27/18 1506      Exercises   Exercises  Lumbar      Lumbar Exercises: Stretches   Single Knee to Chest Stretch  Right    Single Knee to Chest Stretch Limitations  10 reps x 1-2 sec    Piriformis Stretch  Right    Piriformis Stretch Limitations  10 reps x 1-2 seconds    Other Lumbar Stretch Exercise  supine sciatic nerve glide x 10 reps      Modalities   Modalities  --   pt declined  PT Education - 12/27/18 1507    Education Details  HEP, decrease sugar intake to decrease inflammatory process    Person(s) Educated  Patient    Methods  Explanation;Demonstration;Handout    Comprehension  Verbalized understanding;Returned demonstration          PT Long Term Goals - 12/27/18 1512      PT LONG TERM GOAL #1   Title  independent with HEP    Status  New    Target Date  02/07/19      PT LONG TERM GOAL #2   Title  FOTO score  improved to </= 45% limited for improved function    Status  New    Target Date  02/07/19      PT LONG TERM GOAL #3   Title  report pain < 3/10 for improved function and mobility    Status  New    Target Date  02/07/19      PT LONG TERM GOAL #4   Title  report 50% improvement in bed mobility and sleeping for improved pain and function    Status  New    Target Date  02/07/19      PT LONG TERM GOAL #5   Title  n/a             Plan - 12/27/18 1508    Clinical Impression Statement  Pt is a 68 y/o female who returns to OPPT for return of Rt sided low back and glute pain.  Pt demonstrates positive neural tension tests and active trigger points affecting functional mobility.  Pt will benefit from PT to address deficits listed.    Personal Factors and Comorbidities  Age;Past/Current Experience;Comorbidity 2    Comorbidities  bil TKA, obesity    Examination-Activity Limitations  Bed Mobility;Sleep;Stand;Locomotion Level    Examination-Participation Restrictions  Cleaning    Stability/Clinical Decision Making  Evolving/Moderate complexity    Clinical Decision Making  Moderate    Rehab Potential  Good    PT Frequency  2x / week    PT Duration  6 weeks    PT Treatment/Interventions  ADLs/Self Care Home Management;Cryotherapy;Electrical Stimulation;Ultrasound;Moist Heat;Traction;Iontophoresis 4mg /ml Dexamethasone;Gait training;Stair training;Functional mobility training;Therapeutic activities;Therapeutic exercise;Neuromuscular re-education;Patient/family education;Manual techniques;Taping    PT Next Visit Plan  review HEP, progress as able, DN/manual/modalities PRN    PT Home Exercise Plan  Access Code: [email or web address]    Consulted and Agree with Plan of Care  Patient       Patient will benefit from skilled therapeutic intervention in order to improve the following deficits and impairments:  Difficulty walking, Pain, Increased muscle spasms, Increased fascial restricitons, Impaired  flexibility, Decreased strength  Visit Diagnosis: Chronic right-sided low back pain with right-sided sciatica - Plan: PT plan of care cert/re-cert  Muscle weakness (generalized) - Plan: PT plan of care cert/re-cert     Problem List Patient Active Problem List   Diagnosis Date Noted  . Primary osteoarthritis of left knee 08/14/2018  . Primary localized osteoarthritis of right knee 05/03/2016  . Primary osteoarthritis of right knee 05/03/2016      Laureen Abrahams, PT, DPT 12/27/18 3:15 PM     [place] [facility] [place], [place], [address] Phone: [phone]   Fax:  [phone]  Name: Robin Christensen MRN: [id number] Date of Birth: Dec 22, 1950

## 2018-12-27 NOTE — Patient Instructions (Signed)
Access Code: [email or web address]  URL: [email or web address]  Date: 12/27/2018  Prepared by: Faustino Congress   Exercises  Supine Single Knee to Chest - 10 reps - 1 sets - 1-2 second hold - 2x daily - 7x weekly  Supine Piriformis Stretch - 10 reps - 1 sets - 1-2 seconds hold - 2x daily - 7x weekly  Supine Sciatic Nerve Glide - 10 reps - 1 sets - 1-2 seconds hold - 2x daily - 7x weekly  Patient Education  Trigger Point Dry Needling

## 2019-01-01 ENCOUNTER — Ambulatory Visit: Payer: Medicare HMO | Admitting: Physical Therapy

## 2019-01-01 ENCOUNTER — Other Ambulatory Visit: Payer: Self-pay

## 2019-01-01 DIAGNOSIS — G8929 Other chronic pain: Secondary | ICD-10-CM

## 2019-01-01 DIAGNOSIS — M6281 Muscle weakness (generalized): Secondary | ICD-10-CM | POA: Diagnosis not present

## 2019-01-01 DIAGNOSIS — M5441 Lumbago with sciatica, right side: Secondary | ICD-10-CM | POA: Diagnosis not present

## 2019-01-01 NOTE — Patient Instructions (Signed)
Access Code: [email or web address]  URL: [email or web address]  Date: 01/01/2019  Prepared by: Kerin Perna   Exercises  Added: Prone Press Up on Elbows - 5 reps - 1 sets - 5 second hold - 2x daily - 7x weekly

## 2019-01-01 NOTE — Therapy (Signed)
[place], [place], [address] Phone: [phone]   Fax:  [phone]  Physical Therapy Treatment  Patient Details  Name: NARAYA STONEBERG MRN: [id number] Date of Birth: March 02, 1951 Referring Provider (PT): Melrose Nakayama, MD   Encounter Date: 01/01/2019  PT End of Session - 01/01/19 0901    Visit Number  2    Number of Visits  12    Date for PT Re-Evaluation  02/07/19    PT Start Time  0902    PT Stop Time  0950    PT Time Calculation (min)  48 min    Activity Tolerance  Patient limited by pain;Patient tolerated treatment well    Behavior During Therapy  [facility] for tasks assessed/performed       Past Medical History:  Diagnosis Date  . Cancer [facility])    Right Breast Cancer  . Dysrhythmia    AF  . Hyperlipidemia   . Hypertension   . Hypothyroidism   . Obesity   . Pneumonia   . PONV (postoperative nausea and vomiting)   . Sleep apnea    cpcap    Past Surgical History:  Procedure Laterality Date  . BREAST SURGERY    . CARDIOVERSION N/A 06/26/2014   Procedure: CARDIOVERSION;  Surgeon: Laverda Page, MD;  Location: [place];  Service: Cardiovascular;  Laterality: N/A;  . CHOLECYSTECTOMY    . TOTAL KNEE ARTHROPLASTY Right 05/03/2016   Procedure: TOTAL KNEE ARTHROPLASTY;  Surgeon: Melrose Nakayama, MD;  Location: [place];  Service: Orthopedics;  Laterality: Right;  . TOTAL KNEE ARTHROPLASTY Left 08/14/2018   Procedure: TOTAL KNEE ARTHROPLASTY;  Surgeon: Melrose Nakayama, MD;  Location: [place];  Service: Orthopedics;  Laterality: Left;    There were no vitals filed for this visit.  Subjective Assessment - 01/01/19 0902    Subjective  Pt reports she is feeling worse than last visit.  "When I woke it was a 11/10 pain, now it's down to an 8"    Diagnostic tests  MRI: Disc degeneration at L2-3 to L4-5    Patient Stated Goals  improve pain, return to walking    Currently in Pain?  Yes    Pain  Score  8     Pain Location  Buttocks    Pain Orientation  Right    Pain Descriptors / Indicators  Constant;Sharp    Pain Radiating Towards  to Rt knee    Pain Onset  More than a month ago    Aggravating Factors   sleeping on bed over couple of hours    Pain Relieving Factors  medicine         [facility] PT Assessment - 01/01/19 0001      Assessment   Medical Diagnosis  Rt SI joint and Leg pain    Referring Provider (PT)  Melrose Nakayama, MD    Onset Date/Surgical Date  --   April 2020   Hand Dominance  Right    Next MD Visit  01/28/2019    Prior Therapy  many times at this clinic before        [facility] Adult PT Treatment/Exercise - 01/01/19 0001      Lumbar Exercises: Stretches   Prone on Elbows Stretch  5 reps;10 seconds    Other Lumbar Stretch Exercise  prone position for 2 min prior to POE.       Lumbar Exercises: Aerobic   Nustep  L4: 1.5 min - unable to tolerate;  increased pain.       Modalities   Modalities  Electrical Stimulation;Moist Heat      Moist Heat Therapy   Number Minutes Moist Heat  12 Minutes    Moist Heat Location  Lumbar Spine;Hip      Electrical Stimulation   Electrical Stimulation Location  Rt QL and piriformis    Electrical Stimulation Action  IFC    Electrical Stimulation Parameters  intensity to pt tolerance    Electrical Stimulation Goals  Pain;Tone      Manual Therapy   Manual Therapy  Soft tissue mobilization;Myofascial release    Manual therapy comments  Pt in prone and Lt sidelying    Soft tissue mobilization  STM including TPR to Rt QL, piriformis, glute med. TPR with active Rt hip IR/ER     Myofascial Release  MFR to Rt QL             PT Education - 01/01/19 0950    Education Details  HEP    Person(s) Educated  Patient    Methods  Explanation;Demonstration;Handout    Comprehension  Verbalized understanding;Returned demonstration          PT Long Term Goals - 12/27/18 1512      PT LONG TERM GOAL #1   Title  independent  with HEP    Status  New    Target Date  02/07/19      PT LONG TERM GOAL #2   Title  FOTO score improved to </= 45% limited for improved function    Status  New    Target Date  02/07/19      PT LONG TERM GOAL #3   Title  report pain < 3/10 for improved function and mobility    Status  New    Target Date  02/07/19      PT LONG TERM GOAL #4   Title  report 50% improvement in bed mobility and sleeping for improved pain and function    Status  New    Target Date  02/07/19      PT LONG TERM GOAL #5   Title  n/a            Plan - 01/01/19 0902    Clinical Impression Statement  Pt unable to tolerate NuStep, reporting increased radiating symptoms in RLE.  Pt reported significant reduction in pain with prone postion and POE.  Pt very point tender to trigger points in Rt glute, lateral thigh, and QL.  Goals are ongoing at this time.    Personal Factors and Comorbidities  Age;Past/Current Experience;Comorbidity 2    Comorbidities  bil TKA, obesity    Examination-Activity Limitations  Bed Mobility;Sleep;Stand;Locomotion Level    Examination-Participation Restrictions  Cleaning    Stability/Clinical Decision Making  Evolving/Moderate complexity    Rehab Potential  Good    PT Frequency  2x / week    PT Duration  6 weeks    PT Treatment/Interventions  ADLs/Self Care Home Management;Cryotherapy;Electrical Stimulation;Ultrasound;Moist Heat;Traction;Iontophoresis 4mg /ml Dexamethasone;Gait training;Stair training;Functional mobility training;Therapeutic activities;Therapeutic exercise;Neuromuscular re-education;Patient/family education;Manual techniques;Taping    PT Next Visit Plan  review HEP, progress as able, DN/manual/modalities PRN    PT Home Exercise Plan  Access Code: [email or web address]    Consulted and Agree with Plan of Care  Patient       Patient will benefit from skilled therapeutic intervention in order to improve the following deficits and impairments:  Difficulty walking, Pain,  Increased muscle spasms, Increased fascial restricitons, Impaired flexibility, Decreased  strength  Visit Diagnosis: 1. Chronic right-sided low back pain with right-sided sciatica   2. Muscle weakness (generalized)        Problem List Patient Active Problem List   Diagnosis Date Noted  . Primary osteoarthritis of left knee 08/14/2018  . Primary localized osteoarthritis of right knee 05/03/2016  . Primary osteoarthritis of right knee 05/03/2016   Kerin Perna, PTA 01/01/19 9:50 AM  [facility] [place], [place], [address] Phone: [phone]   Fax:  [phone]  Name: DERIAN PFOST MRN: [id number] Date of Birth: January 18, 1951

## 2019-01-02 ENCOUNTER — Encounter: Payer: Self-pay | Admitting: Physical Therapy

## 2019-01-02 ENCOUNTER — Ambulatory Visit: Payer: Medicare HMO | Admitting: Physical Therapy

## 2019-01-02 ENCOUNTER — Encounter: Payer: Self-pay | Admitting: Neurology

## 2019-01-02 DIAGNOSIS — G8929 Other chronic pain: Secondary | ICD-10-CM

## 2019-01-02 DIAGNOSIS — M5441 Lumbago with sciatica, right side: Secondary | ICD-10-CM

## 2019-01-02 DIAGNOSIS — M6281 Muscle weakness (generalized): Secondary | ICD-10-CM

## 2019-01-02 NOTE — Therapy (Signed)
[place], [place], [address] Phone: [phone]   Fax:  [phone]  Physical Therapy Treatment  Patient Details  Name: Robin Christensen MRN: [id number] Date of Birth: 08-Apr-1951 Referring Provider (PT): Melrose Nakayama, MD   Encounter Date: 01/02/2019  PT End of Session - 01/02/19 0903    Visit Number  3    Number of Visits  12    Date for PT Re-Evaluation  02/07/19    PT Start Time  0830    PT Stop Time  0915    PT Time Calculation (min)  45 min    Activity Tolerance  Patient limited by pain;Patient tolerated treatment well    Behavior During Therapy  [facility] for tasks assessed/performed       Past Medical History:  Diagnosis Date  . Cancer [facility])    Right Breast Cancer  . Dysrhythmia    AF  . Hyperlipidemia   . Hypertension   . Hypothyroidism   . Obesity   . Pneumonia   . PONV (postoperative nausea and vomiting)   . Sleep apnea    cpcap    Past Surgical History:  Procedure Laterality Date  . BREAST SURGERY    . CARDIOVERSION N/A 06/26/2014   Procedure: CARDIOVERSION;  Surgeon: Laverda Page, MD;  Location: [place];  Service: Cardiovascular;  Laterality: N/A;  . CHOLECYSTECTOMY    . TOTAL KNEE ARTHROPLASTY Right 05/03/2016   Procedure: TOTAL KNEE ARTHROPLASTY;  Surgeon: Melrose Nakayama, MD;  Location: [place];  Service: Orthopedics;  Laterality: Right;  . TOTAL KNEE ARTHROPLASTY Left 08/14/2018   Procedure: TOTAL KNEE ARTHROPLASTY;  Surgeon: Melrose Nakayama, MD;  Location: [place];  Service: Orthopedics;  Laterality: Left;    There were no vitals filed for this visit.  Subjective Assessment - 01/02/19 0835    Subjective  forward bending really aggravates back    Diagnostic tests  MRI: Disc degeneration at L2-3 to L4-5    Patient Stated Goals  improve pain, return to walking    Currently in Pain?  Yes    Pain Score  3    reports pain was "11 or 12" when arriving in clinic    Pain Location  Buttocks    Pain Orientation  Right    Pain Descriptors / Indicators  Sharp;Constant    Pain Type  Acute pain;Chronic pain    Pain Radiating Towards  to Rt lateral knee    Pain Onset  More than a month ago    Aggravating Factors   sleeping, forward bending    Pain Relieving Factors  medicine                       [facility] Adult PT Treatment/Exercise - 01/02/19 0837      Lumbar Exercises: Stretches   Prone on Elbows Stretch  5 reps;60 seconds   continuous   Press Ups  10 reps   to elbows only     Modalities   Modalities  Electrical Stimulation;Moist Heat      Moist Heat Therapy   Number Minutes Moist Heat  15 Minutes    Moist Heat Location  Lumbar Spine;Hip      Electrical Stimulation   Electrical Stimulation Location  Rt QL and piriformis    Electrical Stimulation Action  IFC    Electrical Stimulation Parameters  to tolerance    Electrical Stimulation Goals  Pain;Tone      Manual Therapy  Manual Therapy  Soft tissue mobilization    Manual therapy comments  Pt in Lt sidelying    Soft tissue mobilization  STM including TPR to Rt QL, piriformis, glute med.        Trigger Point Dry Needling - 01/02/19 0902    Consent Given?  Yes    Education Handout Provided  Previously provided    Muscles Treated Back/Hip  Gluteus medius;Piriformis;Gluteus maximus;Quadratus lumborum    Gluteus Medius Response  Twitch response elicited;Palpable increased muscle length    Gluteus Maximus Response  Twitch response elicited;Palpable increased muscle length    Piriformis Response  Twitch response elicited;Palpable increased muscle length    Quadratus Lumborum Response  Twitch response elicited;Palpable increased muscle length           PT Education - 01/02/19 0902    Education Details  limit forward flexion activities as able until symptoms improve    Person(s) Educated  Patient    Methods  Explanation    Comprehension  Verbalized understanding           PT Long Term Goals - 12/27/18 1512      PT LONG TERM GOAL #1   Title  independent with HEP    Status  New    Target Date  02/07/19      PT LONG TERM GOAL #2   Title  FOTO score improved to </= 45% limited for improved function    Status  New    Target Date  02/07/19      PT LONG TERM GOAL #3   Title  report pain < 3/10 for improved function and mobility    Status  New    Target Date  02/07/19      PT LONG TERM GOAL #4   Title  report 50% improvement in bed mobility and sleeping for improved pain and function    Status  New    Target Date  02/07/19      PT LONG TERM GOAL #5   Title  n/a            Plan - 01/02/19 0903    Clinical Impression Statement  Pt with limited carryover with sessions, reporting pain "11/10" upon entering clinic, which quickly decreases to a 3/10 with extension based exercises. Positive response to DN and manual therapy today.    Personal Factors and Comorbidities  Age;Past/Current Experience;Comorbidity 2    Comorbidities  bil TKA, obesity    Examination-Activity Limitations  Bed Mobility;Sleep;Stand;Locomotion Level    Examination-Participation Restrictions  Cleaning    Stability/Clinical Decision Making  Evolving/Moderate complexity    Rehab Potential  Good    PT Frequency  2x / week    PT Duration  6 weeks    PT Treatment/Interventions  ADLs/Self Care Home Management;Cryotherapy;Electrical Stimulation;Ultrasound;Moist Heat;Traction;Iontophoresis 4mg /ml Dexamethasone;Gait training;Stair training;Functional mobility training;Therapeutic activities;Therapeutic exercise;Neuromuscular re-education;Patient/family education;Manual techniques;Taping    PT Next Visit Plan  review HEP, progress as able, DN/manual/modalities PRN, assess response to DN    PT Home Exercise Plan  Access Code: [email or web address]    Consulted and Agree with Plan of Care  Patient       Patient will benefit from skilled therapeutic intervention in order to improve the  following deficits and impairments:  Difficulty walking, Pain, Increased muscle spasms, Increased fascial restricitons, Impaired flexibility, Decreased strength  Visit Diagnosis: 1. Chronic right-sided low back pain with right-sided sciatica   2. Muscle weakness (generalized)        Problem List Patient  Active Problem List   Diagnosis Date Noted  . Primary osteoarthritis of left knee 08/14/2018  . Primary localized osteoarthritis of right knee 05/03/2016  . Primary osteoarthritis of right knee 05/03/2016      Laureen Abrahams, PT, DPT 01/02/19 9:05 AM     [facility] [place], [place], [address] Phone: [phone]   Fax:  [phone]  Name: Robin Christensen MRN: [id number] Date of Birth: [birth year]

## 2019-01-03 NOTE — Progress Notes (Signed)
Virtual Visit via Video Note The purpose of this virtual visit is to provide medical care while limiting exposure to the novel coronavirus.    Consent was obtained for video visit:  Yes.   Answered questions that patient had about telehealth interaction:  Yes.   I discussed the limitations, risks, security and privacy concerns of performing an evaluation and management service by telemedicine. I also discussed with the patient that there may be a patient responsible charge related to this service. The patient expressed understanding and agreed to proceed.  Pt location: Home Physician Location: office Name of referring provider:  London Pepper, MD I connected with Robin Christensen at patients initiation/request on 01/04/2019 at 10:30 AM EDT by video enabled telemedicine application and verified that I am speaking with the correct person using two identifiers. Pt MRN:  [id number] Pt DOB:  [birth year] Video Participants:  Robin Christensen   History of Present Illness:  Robin Christensen is a 68 year old right-handed female with hypothyroidism, hyperlipidemia, hypertension, OSA, atrial fibrillation status post cardioversion and history of breast cancer status post mastectomy who follows up for tension type headache.  UPDATE: Cymbalta was switched to zonisamide. She has not had a headache in maybe 4 to 5 months.  She had her first headache since that time, this morning.  It was moderate, lasted about 2-3 hours.  She is doing well now.  Current NSAIDS: None Current analgesics: Tylenol Current triptans: None Current ergotamine: None Current anti-emetic: None Current muscle relaxants: None Current anti-anxiolytic: None Current sleep aide: None Current Antihypertensive medications: Lasix Current Antidepressant medications: none Current Anticonvulsant medications: zonisamide 50mg  Current anti-CGRP: None Current Vitamins/Herbal/Supplements: None Current Antihistamines/Decongestants:  None Other therapy: None  Caffeine: No Alcohol: No Smoker: No Diet: Hydrates Exercise: Walks 3 miles 5 days a week Depression: No; Anxiety: No Other pain: Mild neck pain Sleep hygiene: Sleeps 4 to 5 hours straight, wakes up for 2 hours and then sleeps for another 2 hours.  She has OSA which is successfully treated with CPAP.  She was advised to follow up with her dentist to evaluate for TMJ dysfunction.She hasn't had a chance to go to the walker.  HISTORY: Onset:  2018. At the time, they were severe and daily. It was found that her OSA was uncontrolled and CPAP was adjusted. Headaches resolved but returned about 2 months ago, albeit less severe. CPAP was rechecked and is adequate. Location: Bifrontal/across top of head Quality: pressure Initial intensity: mild. Shedenies new headache, thunderclap headache or severe headache that wakes herfrom sleep. Aura: no Prodrome: no Postdrome: no Associated symptoms:  None.Initially some nausea a year ago. Now without nausea, vomiting, photophobia, phonophobia, autonomic symptoms or visual disturbance. She denies associated unilateral numbness or weakness. InitialDuration: 1 hour to all day InitialFrequency: 3 days a week InitialFrequency of abortive medication: Extra-strength Tylenol 3 days a week Triggers:  Emotional stress Relieving factors:  When occupied by other activities Activity: Does not aggravate She endorses aching pain in her jaw, right worse than left. 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 She hasn't seen a dentist in years. She has seen ophthalmology and had cataract surgery this year. Vision is improved.   MRI of brain without and with contrast from 01/15/18 was personally reviewed and was unremarkable except for incidental partial empty sella.  Past NSAIDS: no Past analgesics: no Past abortive triptans: no Past muscle relaxants: no Past anti-emetic: no Past  antihypertensive medications: Toprol XL Past antidepressant medications: Nortriptyline 10mg  (  hunger/weight gain, sluggishness), Cymbalta 60mg  Past anticonvulsant medications:  Topiramate 25 mg at bedtime (discontinued due to cognitive deficits) Past vitamins/Herbal/Supplements: no Past antihistamines/decongestants: no Other past therapies: no  She has history of headaches off and on throughout her life.  Family history of headache: No  In August 2019, she endorsed cognitive changes.  She would repeat things to other people after just 15 minutes.  Another time, she briefly thought her grandchildren were in school but they were out for the summer.  She did recognize the refrigerator repair man.  She was started on B12 supplementation.  Due to these changes, topiramate was discontinued and she was started on gabapentin.  However, she is now not on gabapentin.  She is currently on Cymbalta.  Sleep study was okay.  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  She has a follow up appointment in a couple of weeks. Workup is ongoing.    Past Medical History: Past Medical History:  Diagnosis Date  . Cancer [facility])    Right Breast Cancer  . Dysrhythmia    AF  . Hyperlipidemia   . Hypertension   . Hypothyroidism   . Obesity   . Pneumonia   . PONV (postoperative nausea and vomiting)   . Sleep apnea    cpcap    Medications: Outpatient Encounter Medications as of 01/04/2019  Medication Sig  . CALCIUM-VITAMIN D PO Take 2 tablets by mouth every evening.  . cyclobenzaprine (FLEXERIL) 5 MG tablet Take 5 mg by mouth 3 (three) times daily as needed for muscle spasms.  . diclofenac sodium (VOLTAREN) 1 % GEL Apply topically 4 (four) times daily.  Marland Kitchen ELIQUIS 5 MG TABS tablet TAKE 1 TABLET TWICE DAILY  . levothyroxine (SYNTHROID, LEVOTHROID) 75 MCG tablet Take 75 mcg by mouth daily before breakfast.  . potassium chloride SA (K-DUR,KLOR-CON) 20 MEQ tablet  Take 20 mEq by mouth daily.   . pravastatin (PRAVACHOL) 40 MG tablet Take 40 mg by mouth daily.   Marland Kitchen zonisamide (ZONEGRAN) 50 MG capsule Take 1 capsule by mouth at bedtime  . acetaminophen (TYLENOL) 500 MG tablet Take 500 mg by mouth 3 (three) times daily as needed for mild pain or headache.   . furosemide (LASIX) 40 MG tablet Take 40 mg by mouth daily.  . [DISCONTINUED] HYDROcodone-acetaminophen (NORCO/VICODIN) 5-325 MG tablet Take 1-2 tablets by mouth every 4 (four) hours as needed for moderate pain (pain score 4-6). (Patient not taking: Reported on 12/27/2018)  . [DISCONTINUED] predniSONE (DELTASONE) 5 MG tablet Take 5 mg by mouth daily with breakfast.  . [DISCONTINUED] tiZANidine (ZANAFLEX) 4 MG tablet Take 1 tablet (4 mg total) by mouth every 6 (six) hours as needed. (Patient not taking: Reported on 12/27/2018)   No facility-administered encounter medications on file as of 01/04/2019.     Allergies: Allergies  Allergen Reactions  . Xarelto [Rivaroxaban] Other (See Comments)    JOINT ACHES/PAIN    Family History: No family history on file.  Social History: Social History   Socioeconomic History  . Marital status: Divorced    Spouse name: Not on file  . Number of children: 2  . Years of education: Not on file  . Highest education level: Bachelor's degree (e.g., BA, AB, BS)  Occupational History  . Occupation: RETIRED  Social Needs  . Financial resource strain: Not on file  . Food insecurity    Worry: Not on file    Inability: Not on file  .  Transportation needs    Medical: Not on file    Non-medical: Not on file  Tobacco Use  . Smoking status: Never Smoker  . Smokeless tobacco: Never Used  Substance and Sexual Activity  . Alcohol use: Yes    Comment: once a year, maybe  . Drug use: No  . Sexual activity: Not on file  Lifestyle  . Physical activity    Days per week: Not on file    Minutes per session: Not on file  . Stress: Not on file  Relationships  . Social  Herbalist on phone: Not on file    Gets together: Not on file    Attends religious service: Not on file    Active member of club or organization: Not on file    Attends meetings of clubs or organizations: Not on file    Relationship status: Not on file  . Intimate partner violence    Fear of current or ex partner: Not on file    Emotionally abused: Not on file    Physically abused: Not on file    Forced sexual activity: Not on file  Other Topics Concern  . Not on file  Social History Narrative   Patient is right-handed. She avoids caffeine, walks 3 miles 5 days a week. She lives in a 2 story house. Prior to retirement, she was in Press photographer.    Observations/Objective:   Vitals:   01/02/19 1309  Weight: 230 lb (104.3 kg)  Height: 5' 7.5" (1.715 m)  no acute distress. Alert and oriented.  Speech fluent and not dysarthric.  Language intact.  Face symmetric.    Assessment and Plan:   Episodic tension-type headache, not intractable.  She has been doing well on zonisamide.  1.  For preventative management, zonisamide 50mg  daily 2.  For abortive therapy, Tylenol 3.  Limit use of pain relievers to no more than 2 days out of week to prevent risk of rebound or medication-overuse headache. 4.  Keep headache diary 5.  Exercise, hydration, caffeine cessation, sleep hygiene, monitor for and avoid triggers 6.  Consider:  magnesium citrate 400mg  daily, riboflavin 400mg  daily, and coenzyme Q10 100mg  three times daily 7. Always keep in mind that currently taking a hormone or birth control may be a possible trigger or aggravating factor for migraine. 8. Follow up in 9 months   Follow Up Instructions:    -I discussed the assessment and treatment plan with the patient. The patient was provided an opportunity to ask questions and all were answered. The patient agreed with the plan and demonstrated an understanding of the instructions.   The patient was advised to call back or seek an  in-person evaluation if the symptoms worsen or if the condition fails to improve as anticipated.   Dudley Major, DO

## 2019-01-04 ENCOUNTER — Telehealth (INDEPENDENT_AMBULATORY_CARE_PROVIDER_SITE_OTHER): Payer: Medicare HMO | Admitting: Neurology

## 2019-01-04 ENCOUNTER — Other Ambulatory Visit: Payer: Self-pay

## 2019-01-04 ENCOUNTER — Encounter: Payer: Self-pay | Admitting: Neurology

## 2019-01-04 VITALS — Ht 67.5 in | Wt 230.0 lb

## 2019-01-04 DIAGNOSIS — G44219 Episodic tension-type headache, not intractable: Secondary | ICD-10-CM | POA: Diagnosis not present

## 2019-01-07 ENCOUNTER — Encounter: Payer: Self-pay | Admitting: Physical Therapy

## 2019-01-07 ENCOUNTER — Ambulatory Visit: Payer: Medicare HMO | Admitting: Physical Therapy

## 2019-01-07 ENCOUNTER — Other Ambulatory Visit: Payer: Self-pay

## 2019-01-07 DIAGNOSIS — M6281 Muscle weakness (generalized): Secondary | ICD-10-CM | POA: Diagnosis not present

## 2019-01-07 DIAGNOSIS — G8929 Other chronic pain: Secondary | ICD-10-CM | POA: Diagnosis not present

## 2019-01-07 DIAGNOSIS — M5441 Lumbago with sciatica, right side: Secondary | ICD-10-CM | POA: Diagnosis not present

## 2019-01-07 NOTE — Therapy (Signed)
[place], [place], [address] Phone: [phone]   Fax:  [phone]  Physical Therapy Treatment  Patient Details  Name: Robin Christensen MRN: [id number] Date of Birth: 05/30/1951 Referring Provider (PT): Melrose Nakayama, MD   Encounter Date: 01/07/2019  PT End of Session - 01/07/19 1127    Visit Number  4    Number of Visits  12    Date for PT Re-Evaluation  02/07/19    PT Start Time  3536    PT Stop Time  1130    PT Time Calculation (min)  58 min    Activity Tolerance  Patient limited by pain;Patient tolerated treatment well    Behavior During Therapy  [facility] for tasks assessed/performed       Past Medical History:  Diagnosis Date  . Cancer [facility])    Right Breast Cancer  . Dysrhythmia    AF  . Hyperlipidemia   . Hypertension   . Hypothyroidism   . Obesity   . Pneumonia   . PONV (postoperative nausea and vomiting)   . Sleep apnea    cpcap    Past Surgical History:  Procedure Laterality Date  . BREAST SURGERY    . CARDIOVERSION N/A 06/26/2014   Procedure: CARDIOVERSION;  Surgeon: Laverda Page, MD;  Location: [place];  Service: Cardiovascular;  Laterality: N/A;  . CHOLECYSTECTOMY    . TOTAL KNEE ARTHROPLASTY Right 05/03/2016   Procedure: TOTAL KNEE ARTHROPLASTY;  Surgeon: Melrose Nakayama, MD;  Location: [place];  Service: Orthopedics;  Laterality: Right;  . TOTAL KNEE ARTHROPLASTY Left 08/14/2018   Procedure: TOTAL KNEE ARTHROPLASTY;  Surgeon: Melrose Nakayama, MD;  Location: [place];  Service: Orthopedics;  Laterality: Left;    There were no vitals filed for this visit.  Subjective Assessment - 01/07/19 1034    Subjective  doing better after dry needling, pain still elevated, but "it's not an 11."  still feels like she has a large knot in her buttocks.    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  Yes    Pain Score  6     Pain Location  Buttocks    Pain Orientation  Right    Pain Descriptors / Indicators  Sharp;Constant    Pain Type  Acute pain;Chronic pain    Pain Radiating Towards  Rt mid thigh    Pain Onset  More than a month ago    Pain Frequency  Constant    Aggravating Factors   sleeping, forward bending    Pain Relieving Factors  medicine                       [facility] Adult PT Treatment/Exercise - 01/07/19 1036      Lumbar Exercises: Stretches   Prone on Elbows Stretch  5 reps;60 seconds   continuous     Lumbar Exercises: Aerobic   Nustep  L5 x 5 min      Lumbar Exercises: Standing   Other Standing Lumbar Exercises  Rt lateral shift correction x 10 reps      Moist Heat Therapy   Number Minutes Moist Heat  15 Minutes    Moist Heat Location  Lumbar Spine;Hip      Electrical Stimulation   Electrical Stimulation Location  Rt glutes/piriformis    Electrical Stimulation Action  IFC    Electrical Stimulation Parameters  to tolerance x 15 min    Electrical Stimulation Goals  Pain;Tone      Manual Therapy   Manual Therapy  Soft tissue mobilization    Manual therapy comments  pt prone; skilled palpation and monitoring of soft tissue during DN    Soft tissue mobilization  IASTM to glutes and piriformis       Trigger Point Dry Needling - 01/07/19 1126    Consent Given?  Yes    Education Handout Provided  Previously provided    Muscles Treated Back/Hip  Gluteus maximus;Piriformis    Electrical Stimulation Performed with Dry Needling  Yes    E-stim with Dry Needling Details  to tolerance Rt piriformis    Gluteus Maximus Response  Twitch response elicited;Palpable increased muscle length    Piriformis Response  Twitch response elicited;Palpable increased muscle length                PT Long Term Goals - 12/27/18 1512      PT LONG TERM GOAL #1   Title  independent with HEP    Status  New    Target Date  02/07/19       PT LONG TERM GOAL #2   Title  FOTO score improved to </= 45% limited for improved function    Status  New    Target Date  02/07/19      PT LONG TERM GOAL #3   Title  report pain < 3/10 for improved function and mobility    Status  New    Target Date  02/07/19      PT LONG TERM GOAL #4   Title  report 50% improvement in bed mobility and sleeping for improved pain and function    Status  New    Target Date  02/07/19      PT LONG TERM GOAL #5   Title  n/a            Plan - 01/07/19 1127    Clinical Impression Statement  Pt arrived today with positive response from previous session reporting starting pain 6/10 today rather than 11/10 last visit.  Pt reported pain and tenderness significantly decreased following DN and manual therapy today.  No goals met today, slowly progressing with PT.    Personal Factors and Comorbidities  Age;Past/Current Experience;Comorbidity 2    Comorbidities  bil TKA, obesity    Examination-Activity Limitations  Bed Mobility;Sleep;Stand;Locomotion Level    Examination-Participation Restrictions  Cleaning    Stability/Clinical Decision Making  Evolving/Moderate complexity    Rehab Potential  Good    PT Frequency  2x / week    PT Duration  6 weeks    PT Treatment/Interventions  ADLs/Self Care Home Management;Cryotherapy;Electrical Stimulation;Ultrasound;Moist Heat;Traction;Iontophoresis 18m/ml Dexamethasone;Gait training;Stair training;Functional mobility training;Therapeutic activities;Therapeutic exercise;Neuromuscular re-education;Patient/family education;Manual techniques;Taping    PT Next Visit Plan  review HEP, progress as able, DN/manual/modalities PRN, assess response to DN    PT Home Exercise Plan  Access Code: [email or web address]   Consulted and Agree with Plan of Care  Patient       Patient will benefit from skilled therapeutic intervention in order to improve the following deficits and impairments:  Difficulty walking, Pain, Increased muscle spasms,  Increased fascial restricitons, Impaired flexibility, Decreased strength  Visit Diagnosis: 1. Chronic right-sided low back pain with right-sided sciatica   2. Muscle weakness (generalized)        Problem List Patient Active Problem List  Diagnosis Date Noted  . Primary osteoarthritis of left knee 08/14/2018  . Primary localized osteoarthritis of right knee 05/03/2016  . Primary osteoarthritis of right knee 05/03/2016  . Abnormal auditory perception of both ears 12/01/2015      Laureen Abrahams, PT, DPT 01/07/19 11:29 AM    [facility] [place], [place], [address] Phone: [phone]   Fax:  [phone]  Name: Robin Christensen MRN: [id number] Date of Birth: [birth year]

## 2019-01-10 ENCOUNTER — Ambulatory Visit: Payer: Medicare HMO | Admitting: Physical Therapy

## 2019-01-10 ENCOUNTER — Other Ambulatory Visit: Payer: Self-pay

## 2019-01-10 ENCOUNTER — Encounter: Payer: Self-pay | Admitting: Physical Therapy

## 2019-01-10 DIAGNOSIS — M6281 Muscle weakness (generalized): Secondary | ICD-10-CM | POA: Diagnosis not present

## 2019-01-10 DIAGNOSIS — M5441 Lumbago with sciatica, right side: Secondary | ICD-10-CM

## 2019-01-10 DIAGNOSIS — G8929 Other chronic pain: Secondary | ICD-10-CM

## 2019-01-10 NOTE — Therapy (Signed)
[place], [place], [address] Phone: [phone]   Fax:  [phone]  Physical Therapy Treatment  Patient Details  Name: Robin Christensen MRN: [id number] Date of Birth: Aug 27, 1950 Referring Provider (PT): Melrose Nakayama, MD   Encounter Date: 01/10/2019  PT End of Session - 01/10/19 0905    Visit Number  5    Number of Visits  12    Date for PT Re-Evaluation  02/07/19    PT Start Time  0902    PT Stop Time  0952    PT Time Calculation (min)  50 min    Activity Tolerance  Patient tolerated treatment well    Behavior During Therapy  [facility] for tasks assessed/performed       Past Medical History:  Diagnosis Date  . Cancer [facility])    Right Breast Cancer  . Dysrhythmia    AF  . Hyperlipidemia   . Hypertension   . Hypothyroidism   . Obesity   . Pneumonia   . PONV (postoperative nausea and vomiting)   . Sleep apnea    cpcap    Past Surgical History:  Procedure Laterality Date  . BREAST SURGERY    . CARDIOVERSION N/A 06/26/2014   Procedure: CARDIOVERSION;  Surgeon: Laverda Page, MD;  Location: [place];  Service: Cardiovascular;  Laterality: N/A;  . CHOLECYSTECTOMY    . TOTAL KNEE ARTHROPLASTY Right 05/03/2016   Procedure: TOTAL KNEE ARTHROPLASTY;  Surgeon: Melrose Nakayama, MD;  Location: [place];  Service: Orthopedics;  Laterality: Right;  . TOTAL KNEE ARTHROPLASTY Left 08/14/2018   Procedure: TOTAL KNEE ARTHROPLASTY;  Surgeon: Melrose Nakayama, MD;  Location: [place];  Service: Orthopedics;  Laterality: Left;    There were no vitals filed for this visit.  Subjective Assessment - 01/10/19 0905    Subjective  Pt reports her pain was 15/10 yesterday, "then at 3pm it (buttock pain) let go some". Pt continues to be persistent.    How long can you sit comfortably?  30 min    How long can you walk comfortably?  45 min    Pain Score  5     Pain Location  Buttocks    Pain Orientation  Right     Pain Descriptors / Indicators  Burning;Constant    Pain Radiating Towards  to Rt knee    Aggravating Factors   sleeping, bending forward    Pain Relieving Factors  medicine         [facility] PT Assessment - 01/10/19 0001      Assessment   Medical Diagnosis  Rt SI joint and Leg pain    Referring Provider (PT)  Melrose Nakayama, MD    Onset Date/Surgical Date  --   April 2020   Hand Dominance  Right    Next MD Visit  01/28/2019    Prior Therapy  many times at this clinic before        [facility] Adult PT Treatment/Exercise - 01/10/19 0001      Lumbar Exercises: Stretches   Passive Hamstring Stretch  Right;3 reps;30 seconds   supine with strap, opp knee bent   Prone on Elbows Stretch  5 reps;20 seconds    Piriformis Stretch  Right;2 reps;30 seconds   modified pigeon with LLE off edge of table.      Lumbar Exercises: Aerobic   Nustep  L4: 5 mi n(legs only)      Lumbar Exercises: Sidelying   Clam  Right;10  reps;5 seconds    Clam Limitations  reverse clams RLE x 10 reps       Modalities   Modalities  Electrical Stimulation;Iontophoresis;Moist Heat      Moist Heat Therapy   Number Minutes Moist Heat  10 Minutes    Moist Heat Location  Lumbar Spine;Hip      Electrical Stimulation   Electrical Stimulation Location  Rt glutes/piriformis    Electrical Stimulation Action  IFC    Electrical Stimulation Parameters  intensity to tolerance    Electrical Stimulation Goals  Pain;Tone      Iontophoresis   Type of Iontophoresis  Dexamethasone    Location  Rt SI joint     Dose  1.0 cc    Time  80 mA, 6 hr patch.       Manual Therapy   Manual therapy comments  pt in Lt sidelying    Soft tissue mobilization  TPR to Rt glute and hip rotators with contract/relax of associated muscles.  STM to lateral / post Rt thigh       PT Long Term Goals - 12/27/18 1512      PT LONG TERM GOAL #1   Title  independent with HEP    Status  New    Target Date  02/07/19      PT LONG TERM GOAL #2    Title  FOTO score improved to </= 45% limited for improved function    Status  New    Target Date  02/07/19      PT LONG TERM GOAL #3   Title  report pain < 3/10 for improved function and mobility    Status  New    Target Date  02/07/19      PT LONG TERM GOAL #4   Title  report 50% improvement in bed mobility and sleeping for improved pain and function    Status  New    Target Date  02/07/19      PT LONG TERM GOAL #5   Title  n/a            Plan - 01/10/19 1006    Clinical Impression Statement  Pt has had positive response to DN last session.  Continued tenderness/ tightness in Rt glute and hip; improved with manual therapy.  Trial of ionto patch added to Rt SI joint.  Pt reported slight reduction of pain at end of session. Gradually progressing towards goals.    Personal Factors and Comorbidities  Age;Past/Current Experience;Comorbidity 2    Comorbidities  bil TKA, obesity    Examination-Activity Limitations  Bed Mobility;Sleep;Stand;Locomotion Level    Examination-Participation Restrictions  Cleaning    Stability/Clinical Decision Making  Evolving/Moderate complexity    Rehab Potential  Good    PT Frequency  2x / week    PT Duration  6 weeks    PT Treatment/Interventions  ADLs/Self Care Home Management;Cryotherapy;Electrical Stimulation;Ultrasound;Moist Heat;Traction;Iontophoresis 4mg /ml Dexamethasone;Gait training;Stair training;Functional mobility training;Therapeutic activities;Therapeutic exercise;Neuromuscular re-education;Patient/family education;Manual techniques;Taping    PT Next Visit Plan  review HEP, progress as able, DN/manual/modalities PRN, assess response to DN    PT Home Exercise Plan  Access Code: [email or web address]    Consulted and Agree with Plan of Care  Patient       Patient will benefit from skilled therapeutic intervention in order to improve the following deficits and impairments:  Difficulty walking, Pain, Increased muscle spasms, Increased fascial  restricitons, Impaired flexibility, Decreased strength  Visit Diagnosis: 1. Chronic right-sided low back pain with  right-sided sciatica   2. Muscle weakness (generalized)        Problem List Patient Active Problem List   Diagnosis Date Noted  . Primary osteoarthritis of left knee 08/14/2018  . Primary localized osteoarthritis of right knee 05/03/2016  . Primary osteoarthritis of right knee 05/03/2016  . Abnormal auditory perception of both ears 12/01/2015   Kerin Perna, PTA 01/10/19 11:09 AM  [place], [place], [address] Phone: [phone]   Fax:  [phone]  Name: Robin Christensen MRN: [id number] Date of Birth: 20-Oct-1950

## 2019-01-14 ENCOUNTER — Ambulatory Visit: Payer: Medicare HMO | Admitting: Physical Therapy

## 2019-01-14 ENCOUNTER — Other Ambulatory Visit: Payer: Self-pay

## 2019-01-14 ENCOUNTER — Encounter: Payer: Self-pay | Admitting: Physical Therapy

## 2019-01-14 DIAGNOSIS — G8929 Other chronic pain: Secondary | ICD-10-CM | POA: Diagnosis not present

## 2019-01-14 DIAGNOSIS — M6281 Muscle weakness (generalized): Secondary | ICD-10-CM | POA: Diagnosis not present

## 2019-01-14 DIAGNOSIS — M5441 Lumbago with sciatica, right side: Secondary | ICD-10-CM

## 2019-01-14 NOTE — Therapy (Signed)
[place], [place], [address] Phone: [phone]   Fax:  [phone]  Physical Therapy Treatment  Patient Details  Name: Robin Christensen MRN: [id number] Date of Birth: 10/11/1950 Referring Provider (PT): Melrose Nakayama, MD   Encounter Date: 01/14/2019  PT End of Session - 01/14/19 0916    Visit Number  6    Number of Visits  12    Date for PT Re-Evaluation  02/07/19    PT Start Time  0825    PT Stop Time  0910    PT Time Calculation (min)  45 min    Activity Tolerance  Patient tolerated treatment well    Behavior During Therapy  [facility] for tasks assessed/performed       Past Medical History:  Diagnosis Date  . Cancer [facility])    Right Breast Cancer  . Dysrhythmia    AF  . Hyperlipidemia   . Hypertension   . Hypothyroidism   . Obesity   . Pneumonia   . PONV (postoperative nausea and vomiting)   . Sleep apnea    cpcap    Past Surgical History:  Procedure Laterality Date  . BREAST SURGERY    . CARDIOVERSION N/A 06/26/2014   Procedure: CARDIOVERSION;  Surgeon: Laverda Page, MD;  Location: [place];  Service: Cardiovascular;  Laterality: N/A;  . CHOLECYSTECTOMY    . TOTAL KNEE ARTHROPLASTY Right 05/03/2016   Procedure: TOTAL KNEE ARTHROPLASTY;  Surgeon: Melrose Nakayama, MD;  Location: [place];  Service: Orthopedics;  Laterality: Right;  . TOTAL KNEE ARTHROPLASTY Left 08/14/2018   Procedure: TOTAL KNEE ARTHROPLASTY;  Surgeon: Melrose Nakayama, MD;  Location: [place];  Service: Orthopedics;  Laterality: Left;    There were no vitals filed for this visit.  Subjective Assessment - 01/14/19 0829    Subjective  pain is overall getting better.  still variable, but not as intense when elevated.    How long can you sit comfortably?  30 min    How long can you walk comfortably?  45 min    Patient Stated Goals  improve pain, return to walking    Currently in Pain?  Yes    Pain Score  3     Pain Location  Buttocks    Pain Orientation  Right    Pain Descriptors / Indicators  Burning    Pain Type  Acute pain;Chronic pain    Pain Onset  More than a month ago    Aggravating Factors   sleeping, bending forward    Pain Relieving Factors  medicine                       [facility] Adult PT Treatment/Exercise - 01/14/19 0830      Lumbar Exercises: Stretches   Prone on Elbows Stretch  20 seconds;5 reps   2 sets     Lumbar Exercises: Aerobic   Nustep  L5 x 6 min      Lumbar Exercises: Standing   Other Standing Lumbar Exercises  Rt lateral shift correction 10x10 sec      Iontophoresis   Type of Iontophoresis  Dexamethasone    Location  Rt SI joint     Dose  1.0 cc    Time  80 mA, 6 hr patch.       Manual Therapy   Manual Therapy  Soft tissue mobilization    Soft tissue mobilization  TPR to Rt glute and hip  rotators with contract/relax of associated muscles.        Trigger Point Dry Needling - 01/14/19 0915    Consent Given?  Yes    Education Handout Provided  Previously provided    Muscles Treated Back/Hip  Gluteus minimus;Gluteus medius    Electrical Stimulation Performed with Dry Needling  Yes    E-stim with Dry Needling Details  Glute min to tolerance x 6 min    Gluteus Minimus Response  Twitch response elicited;Palpable increased muscle length    Gluteus Medius Response  Twitch response elicited;Palpable increased muscle length                PT Long Term Goals - 12/27/18 1512      PT LONG TERM GOAL #1   Title  independent with HEP    Status  New    Target Date  02/07/19      PT LONG TERM GOAL #2   Title  FOTO score improved to </= 45% limited for improved function    Status  New    Target Date  02/07/19      PT LONG TERM GOAL #3   Title  report pain < 3/10 for improved function and mobility    Status  New    Target Date  02/07/19      PT LONG TERM GOAL #4   Title  report 50% improvement in bed mobility and sleeping for improved  pain and function    Status  New    Target Date  02/07/19      PT LONG TERM GOAL #5   Title  n/a            Plan - 01/14/19 0917    Clinical Impression Statement  Pt continues to have steady improvements in symptoms while increasing activity at home.  She reports symptoms can still be variable, but overall intensity and frequency is decreased.  No goals met to date, will plan to check next visit.    Personal Factors and Comorbidities  Age;Past/Current Experience;Comorbidity 2    Comorbidities  bil TKA, obesity    Examination-Activity Limitations  Bed Mobility;Sleep;Stand;Locomotion Level    Examination-Participation Restrictions  Cleaning    Stability/Clinical Decision Making  Evolving/Moderate complexity    Rehab Potential  Good    PT Frequency  2x / week    PT Duration  6 weeks    PT Treatment/Interventions  ADLs/Self Care Home Management;Cryotherapy;Electrical Stimulation;Ultrasound;Moist Heat;Traction;Iontophoresis 26m/ml Dexamethasone;Gait training;Stair training;Functional mobility training;Therapeutic activities;Therapeutic exercise;Neuromuscular re-education;Patient/family education;Manual techniques;Taping    PT Next Visit Plan  review HEP, progress as able, DN/manual/modalities PRN, assess response to DN, look at goals    PT Home Exercise Plan  Access Code: [email or web address]   Consulted and Agree with Plan of Care  Patient       Patient will benefit from skilled therapeutic intervention in order to improve the following deficits and impairments:  Difficulty walking, Pain, Increased muscle spasms, Increased fascial restricitons, Impaired flexibility, Decreased strength  Visit Diagnosis: 1. Chronic right-sided low back pain with right-sided sciatica   2. Muscle weakness (generalized)        Problem List Patient Active Problem List   Diagnosis Date Noted  . Primary osteoarthritis of left knee 08/14/2018  . Primary localized osteoarthritis of right knee 05/03/2016  .  Primary osteoarthritis of right knee 05/03/2016  . Abnormal auditory perception of both ears 12/01/2015      SLaureen Abrahams PT, DPT 01/14/19 9:20 AM  [place], [place], [address] Phone: [phone]   Fax:  [phone]  Name: ORETA SOLOWAY MRN: [id number] Date of Birth: [birth year]

## 2019-01-16 ENCOUNTER — Other Ambulatory Visit: Payer: Self-pay

## 2019-01-16 ENCOUNTER — Ambulatory Visit: Payer: Medicare HMO | Admitting: Physical Therapy

## 2019-01-16 DIAGNOSIS — M5441 Lumbago with sciatica, right side: Secondary | ICD-10-CM

## 2019-01-16 DIAGNOSIS — M6281 Muscle weakness (generalized): Secondary | ICD-10-CM | POA: Diagnosis not present

## 2019-01-16 DIAGNOSIS — G8929 Other chronic pain: Secondary | ICD-10-CM

## 2019-01-16 NOTE — Therapy (Addendum)
[place], [place], [address] Phone: [phone]   Fax:  [phone]  Physical Therapy Treatment  Patient Details  Name: Robin Christensen MRN: [id number] Date of Birth: 07-10-1951 Referring Provider (PT): Melrose Nakayama, MD   Encounter Date: 01/16/2019  PT End of Session - 01/16/19 0848    Visit Number  7    Number of Visits  12    Date for PT Re-Evaluation  02/07/19    PT Start Time  0803    PT Stop Time  0855    PT Time Calculation (min)  52 min    Activity Tolerance  Patient tolerated treatment well    Behavior During Therapy  [facility] for tasks assessed/performed       Past Medical History:  Diagnosis Date  . Cancer [facility])    Right Breast Cancer  . Dysrhythmia    AF  . Hyperlipidemia   . Hypertension   . Hypothyroidism   . Obesity   . Pneumonia   . PONV (postoperative nausea and vomiting)   . Sleep apnea    cpcap    Past Surgical History:  Procedure Laterality Date  . BREAST SURGERY    . CARDIOVERSION N/A 06/26/2014   Procedure: CARDIOVERSION;  Surgeon: Laverda Page, MD;  Location: [place];  Service: Cardiovascular;  Laterality: N/A;  . CHOLECYSTECTOMY    . TOTAL KNEE ARTHROPLASTY Right 05/03/2016   Procedure: TOTAL KNEE ARTHROPLASTY;  Surgeon: Melrose Nakayama, MD;  Location: [place];  Service: Orthopedics;  Laterality: Right;  . TOTAL KNEE ARTHROPLASTY Left 08/14/2018   Procedure: TOTAL KNEE ARTHROPLASTY;  Surgeon: Melrose Nakayama, MD;  Location: [place];  Service: Orthopedics;  Laterality: Left;    There were no vitals filed for this visit.  Subjective Assessment - 01/16/19 0805    Subjective  "today is the first day I woke up without pain".  Pt reports she has tried to use rolling pin often.    Currently in Pain?  Yes    Pain Score  2     Pain Location  Buttocks    Pain Orientation  Right    Pain Descriptors / Indicators  Dull;Constant    Aggravating Factors   bending  forward    Pain Relieving Factors  exercises and medicine         [facility] PT Assessment - 01/16/19 0001      Assessment   Medical Diagnosis  Rt SI joint and Leg pain    Referring Provider (PT)  Melrose Nakayama, MD    Onset Date/Surgical Date  --   April 2020   Hand Dominance  Right    Next MD Visit  01/28/2019    Prior Therapy  many times at this clinic before        [facility] Adult PT Treatment/Exercise - 01/16/19 0001      Lumbar Exercises: Stretches   Prone on Elbows Stretch  20 seconds;5 reps   2 sets   Piriformis Stretch  Right;2 reps;30 seconds   modified pigeon with LLE off edge of table.      Lumbar Exercises: Aerobic   Nustep  L4: 5.5 min       Lumbar Exercises: Standing   Other Standing Lumbar Exercises  Rt lateral shift correction 10x10 sec      Lumbar Exercises: Sidelying   Clam  Right;10 reps;1 second    Clam Limitations  reverse clams RLE x 10 reps     Hip  Abduction  Right;5 reps;2 seconds      Knee/Hip Exercises: Standing   Hip Abduction  Stengthening;Right;Left;1 set;5 reps      Moist Heat Therapy   Number Minutes Moist Heat  15 Minutes    Moist Heat Location  Lumbar Spine;Hip      Electrical Stimulation   Electrical Stimulation Location  Rt glutes/piriformis    Electrical Stimulation Action  IFC    Electrical Stimulation Parameters  intensity to tolerance     Electrical Stimulation Goals  Pain;Tone      Iontophoresis   Type of Iontophoresis  --   held     Manual Therapy   Manual therapy comments  pt in Lt sidelying    Soft tissue mobilization  TPR to Rt hip rotators and abductors with contract/relax of associated muscles.   Pin and stretch to Rt lateral hamstring.      Myofascial Release  MFR to Rt glute                  PT Long Term Goals - 12/27/18 1512      PT LONG TERM GOAL #1   Title  independent with HEP    Status  New    Target Date  02/07/19      PT LONG TERM GOAL #2   Title  FOTO score improved to </= 45% limited for  improved function    Status  New    Target Date  02/07/19      PT LONG TERM GOAL #3   Title  report pain < 3/10 for improved function and mobility    Status  New    Target Date  02/07/19      PT LONG TERM GOAL #4   Title  report 50% improvement in bed mobility and sleeping for improved pain and function    Status  New    Target Date  02/07/19      PT LONG TERM GOAL #5   Title  n/a            Plan - 01/16/19 0850    Clinical Impression Statement  Pt fatigues quickly with Rt hip abdct exercises. Today she reports 50% improvement in bed mobility and sleep, however this has been inconsistent. continued steady improvement in symptoms and functional mobility.    Personal Factors and Comorbidities  Age;Past/Current Experience;Comorbidity 2    Comorbidities  bil TKA, obesity    Examination-Activity Limitations  Bed Mobility;Sleep;Stand;Locomotion Level    Examination-Participation Restrictions  Cleaning    Stability/Clinical Decision Making  Evolving/Moderate complexity    Rehab Potential  Good    PT Frequency  2x / week    PT Duration  6 weeks    PT Treatment/Interventions  ADLs/Self Care Home Management;Cryotherapy;Electrical Stimulation;Ultrasound;Moist Heat;Traction;Iontophoresis 4mg /ml Dexamethasone;Gait training;Stair training;Functional mobility training;Therapeutic activities;Therapeutic exercise;Neuromuscular re-education;Patient/family education;Manual techniques;Taping    PT Next Visit Plan  review HEP, progress as able, DN/manual/modalities PRN.    PT Home Exercise Plan  Access Code: [email or web address]    IRSWNIOEV and Agree with Plan of Care  Patient       Patient will benefit from skilled therapeutic intervention in order to improve the following deficits and impairments:  Difficulty walking, Pain, Increased muscle spasms, Increased fascial restricitons, Impaired flexibility, Decreased strength  Visit Diagnosis: 1. Chronic right-sided low back pain with right-sided sciatica    2. Muscle weakness (generalized)        Problem List Patient Active Problem List   Diagnosis Date Noted  .  Primary osteoarthritis of left knee 08/14/2018  . Primary localized osteoarthritis of right knee 05/03/2016  . Primary osteoarthritis of right knee 05/03/2016  . Abnormal auditory perception of both ears 12/01/2015   Kerin Perna, PTA 01/16/19 9:31 AM  [facility] [place], [place], [address] Phone: [phone]   Fax:  [phone]  Name: ERMIE GLENDENNING MRN: [id number] Date of Birth: 06-29-1951

## 2019-01-22 ENCOUNTER — Encounter: Payer: Medicare HMO | Admitting: Physical Therapy

## 2019-01-25 ENCOUNTER — Encounter: Payer: Self-pay | Admitting: Physical Therapy

## 2019-01-25 ENCOUNTER — Other Ambulatory Visit: Payer: Self-pay

## 2019-01-25 ENCOUNTER — Ambulatory Visit: Payer: Medicare HMO | Admitting: Physical Therapy

## 2019-01-25 DIAGNOSIS — G8929 Other chronic pain: Secondary | ICD-10-CM | POA: Diagnosis not present

## 2019-01-25 DIAGNOSIS — M6281 Muscle weakness (generalized): Secondary | ICD-10-CM | POA: Diagnosis not present

## 2019-01-25 DIAGNOSIS — M5441 Lumbago with sciatica, right side: Secondary | ICD-10-CM | POA: Diagnosis not present

## 2019-01-25 NOTE — Therapy (Signed)
[place], [place], [address] Phone: [phone]   Fax:  [phone]  Physical Therapy Treatment  Patient Details  Name: Robin Christensen MRN: [id number] Date of Birth: May 27, 1951 Referring Provider (PT): Melrose Nakayama, MD   Encounter Date: 01/25/2019  PT End of Session - 01/25/19 1006    Visit Number  8    Number of Visits  12    Date for PT Re-Evaluation  02/07/19    PT Start Time  0925    PT Stop Time  1016    PT Time Calculation (min)  51 min    Activity Tolerance  Patient tolerated treatment well    Behavior During Therapy  [facility] for tasks assessed/performed       Past Medical History:  Diagnosis Date  . Cancer [facility])    Right Breast Cancer  . Dysrhythmia    AF  . Hyperlipidemia   . Hypertension   . Hypothyroidism   . Obesity   . Pneumonia   . PONV (postoperative nausea and vomiting)   . Sleep apnea    cpcap    Past Surgical History:  Procedure Laterality Date  . BREAST SURGERY    . CARDIOVERSION N/A 06/26/2014   Procedure: CARDIOVERSION;  Surgeon: Laverda Page, MD;  Location: [place];  Service: Cardiovascular;  Laterality: N/A;  . CHOLECYSTECTOMY    . TOTAL KNEE ARTHROPLASTY Right 05/03/2016   Procedure: TOTAL KNEE ARTHROPLASTY;  Surgeon: Melrose Nakayama, MD;  Location: [place];  Service: Orthopedics;  Laterality: Right;  . TOTAL KNEE ARTHROPLASTY Left 08/14/2018   Procedure: TOTAL KNEE ARTHROPLASTY;  Surgeon: Melrose Nakayama, MD;  Location: [place];  Service: Orthopedics;  Laterality: Left;    There were no vitals filed for this visit.  Subjective Assessment - 01/25/19 0928    Subjective  "I actually think I'm getting better.  In time to go to the MD Monday."  sleeping and bed mobility is at least 75% better.    Patient Stated Goals  improve pain, return to walking    Currently in Pain?  Yes    Pain Score  1     Pain Location  Buttocks    Pain Orientation  Right     Pain Descriptors / Indicators  Constant;Dull   twinge   Pain Type  Acute pain    Pain Onset  More than a month ago    Pain Frequency  Constant    Aggravating Factors   bending forward    Pain Relieving Factors  exercises and medicine         [facility] PT Assessment - 01/25/19 0939      Assessment   Medical Diagnosis  Rt SI joint and Leg pain    Referring Provider (PT)  Melrose Nakayama, MD      Observation/Other Assessments   Focus on Therapeutic Outcomes (FOTO)   63 (37% limited)                   [facility] Adult PT Treatment/Exercise - 01/25/19 0929      Lumbar Exercises: Stretches   Prone on Elbows Stretch  5 reps;30 seconds   2 sets     Lumbar Exercises: Aerobic   Nustep  L5 x 6 min      Moist Heat Therapy   Number Minutes Moist Heat  15 Minutes    Moist Heat Location  Lumbar Spine      Electrical Stimulation   Electrical  Stimulation Location  Rt glutes/piriformis    Electrical Stimulation Action  IFC    Electrical Stimulation Parameters  to tolerance    Electrical Stimulation Goals  Pain;Tone      Manual Therapy   Manual Therapy  Soft tissue mobilization    Manual therapy comments  pt prone; skilled palpation and monitoring of soft tissue during DN    Soft tissue mobilization  Rt piriformis and glutes with TPR       Trigger Point Dry Needling - 01/25/19 1006    Consent Given?  Yes    Education Handout Provided  Previously provided    Muscles Treated Back/Hip  Gluteus maximus;Piriformis    Gluteus Maximus Response  Twitch response elicited;Palpable increased muscle length    Piriformis Response  Twitch response elicited;Palpable increased muscle length                PT Long Term Goals - 01/25/19 1006      PT LONG TERM GOAL #1   Title  independent with HEP    Status  On-going    Target Date  02/07/19      PT LONG TERM GOAL #2   Title  FOTO score improved to </= 45% limited for improved function    Status  Achieved      PT LONG TERM  GOAL #3   Title  report pain < 3/10 for improved function and mobility    Status  Achieved      PT LONG TERM GOAL #4   Title  report 50% improvement in bed mobility and sleeping for improved pain and function    Status  Achieved      PT LONG TERM GOAL #5   Title  n/a            Plan - 01/25/19 1007    Clinical Impression Statement  Pt has met all LTGs assessed to date, and will likely meet HEP goal as she is very consistent with exercises as they bring pain quickly down if it becomes elevated.  Anticipate d/c or hold after next visit.    Personal Factors and Comorbidities  Age;Past/Current Experience;Comorbidity 2    Comorbidities  bil TKA, obesity    Examination-Activity Limitations  Bed Mobility;Sleep;Stand;Locomotion Level    Examination-Participation Restrictions  Cleaning    Stability/Clinical Decision Making  Evolving/Moderate complexity    Rehab Potential  Good    PT Frequency  2x / week    PT Duration  6 weeks    PT Treatment/Interventions  ADLs/Self Care Home Management;Cryotherapy;Electrical Stimulation;Ultrasound;Moist Heat;Traction;Iontophoresis 69m/ml Dexamethasone;Gait training;Stair training;Functional mobility training;Therapeutic activities;Therapeutic exercise;Neuromuscular re-education;Patient/family education;Manual techniques;Taping    PT Next Visit Plan  check HEP and update PRN, likely d/c or hold after next session    PT Home Exercise Plan  Access Code: [email or web address]   Consulted and Agree with Plan of Care  Patient       Patient will benefit from skilled therapeutic intervention in order to improve the following deficits and impairments:  Difficulty walking, Pain, Increased muscle spasms, Increased fascial restricitons, Impaired flexibility, Decreased strength  Visit Diagnosis: 1. Chronic right-sided low back pain with right-sided sciatica   2. Muscle weakness (generalized)        Problem List Patient Active Problem List   Diagnosis Date Noted  .  Primary osteoarthritis of left knee 08/14/2018  . Primary localized osteoarthritis of right knee 05/03/2016  . Primary osteoarthritis of right knee 05/03/2016  . Abnormal auditory perception of both ears  12/01/2015      Laureen Abrahams, PT, DPT 01/25/19 10:09 AM    [facility] [place], [place], [address] Phone: [phone]   Fax:  [phone]  Name: Robin Christensen MRN: [id number] Date of Birth: 28-Jun-1951

## 2019-01-28 DIAGNOSIS — M545 Low back pain: Secondary | ICD-10-CM | POA: Diagnosis not present

## 2019-01-28 DIAGNOSIS — M25562 Pain in left knee: Secondary | ICD-10-CM | POA: Diagnosis not present

## 2019-01-29 ENCOUNTER — Encounter: Payer: Self-pay | Admitting: Physical Therapy

## 2019-01-29 ENCOUNTER — Ambulatory Visit: Payer: Medicare HMO | Admitting: Physical Therapy

## 2019-01-29 ENCOUNTER — Other Ambulatory Visit: Payer: Self-pay

## 2019-01-29 DIAGNOSIS — M6281 Muscle weakness (generalized): Secondary | ICD-10-CM

## 2019-01-29 DIAGNOSIS — G8929 Other chronic pain: Secondary | ICD-10-CM

## 2019-01-29 DIAGNOSIS — M5441 Lumbago with sciatica, right side: Secondary | ICD-10-CM | POA: Diagnosis not present

## 2019-01-29 NOTE — Therapy (Signed)
[place], [place], [address] Phone: [phone]   Fax:  [phone]  Physical Therapy Treatment  Patient Details  Name: Robin Christensen MRN: [id number] Date of Birth: 06-May-1951 Referring Provider (PT): Melrose Nakayama, MD   Encounter Date: 01/29/2019  PT End of Session - 01/29/19 0911    Visit Number  9    Number of Visits  12    Date for PT Re-Evaluation  02/07/19    PT Start Time  0900    PT Stop Time  0951    PT Time Calculation (min)  51 min       Past Medical History:  Diagnosis Date  . Cancer [facility])    Right Breast Cancer  . Dysrhythmia    AF  . Hyperlipidemia   . Hypertension   . Hypothyroidism   . Obesity   . Pneumonia   . PONV (postoperative nausea and vomiting)   . Sleep apnea    cpcap    Past Surgical History:  Procedure Laterality Date  . BREAST SURGERY    . CARDIOVERSION N/A 06/26/2014   Procedure: CARDIOVERSION;  Surgeon: Laverda Page, MD;  Location: [place];  Service: Cardiovascular;  Laterality: N/A;  . CHOLECYSTECTOMY    . TOTAL KNEE ARTHROPLASTY Right 05/03/2016   Procedure: TOTAL KNEE ARTHROPLASTY;  Surgeon: Melrose Nakayama, MD;  Location: [place];  Service: Orthopedics;  Laterality: Right;  . TOTAL KNEE ARTHROPLASTY Left 08/14/2018   Procedure: TOTAL KNEE ARTHROPLASTY;  Surgeon: Melrose Nakayama, MD;  Location: [place];  Service: Orthopedics;  Laterality: Left;    There were no vitals filed for this visit.  Subjective Assessment - 01/29/19 0906    Subjective  Pt reports she feels like she has a huge knot in her buttock.  She had visited the MD yesterday, and he was pleased with her progress.  She wonders if her bed is an issue.    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  Yes    Pain Score  5     Pain Location  Buttocks    Pain Orientation  Right    Pain  Descriptors / Indicators  Sharp    Aggravating Factors   bending over    Pain Relieving Factors  exercises, prolonged sitting, medicine         [facility] PT Assessment - 01/29/19 0001      Assessment   Medical Diagnosis  Rt SI joint and Leg pain    Referring Provider (PT)  Melrose Nakayama, MD    Onset Date/Surgical Date  --   April 2020   Hand Dominance  Right    Next MD Visit  PRN    Prior Therapy  many times at this clinic before      Precautions   Precautions  None       [facility] Adult PT Treatment/Exercise - 01/29/19 0001      Lumbar Exercises: Stretches   Passive Hamstring Stretch  Right;3 reps;30 seconds   supine with strap, opp knee bent   Prone on Elbows Stretch  5 reps;30 seconds   2 sets   Piriformis Stretch  Right;Left;2 reps;30 seconds      Lumbar Exercises: Aerobic   Nustep  L4: 7 min    PTA present to discuss progress      Lumbar Exercises: Sidelying  Clam  Right;10 reps;1 second    Clam Limitations  reverse clams RLE x 10 reps       Modalities   Modalities  --   pt in Lt side lying     Moist Heat Therapy   Number Minutes Moist Heat  15 Minutes    Moist Heat Location  Lumbar Spine;Hip      Electrical Stimulation   Electrical Stimulation Location  Rt glutes/piriformis    Electrical Stimulation Action  IFC    Electrical Stimulation Parameters  to tolerance    Electrical Stimulation Goals  Pain;Tone      Manual Therapy   Manual Therapy  Soft tissue mobilization;Myofascial release    Soft tissue mobilization  TPR to Rt hip rotators and abductors with contract/relax of associated muscles.   Pin and stretch to Rt lateral hamstring.      Myofascial Release  MFR to Rt glute and lumbar paraspinals        PT Long Term Goals - 01/25/19 1006      PT LONG TERM GOAL #1   Title  independent with HEP    Status  On-going    Target Date  02/07/19      PT LONG TERM GOAL #2   Title  FOTO score improved to </= 45% limited for improved function    Status   Achieved      PT LONG TERM GOAL #3   Title  report pain < 3/10 for improved function and mobility    Status  Achieved      PT LONG TERM GOAL #4   Title  report 50% improvement in bed mobility and sleeping for improved pain and function    Status  Achieved      PT LONG TERM GOAL #5   Title  n/a            Plan - 01/29/19 0947    Clinical Impression Statement  Pt had flare up today of symptoms, but not as bad as upon initial eval.  Exercises did not lower pain however, pt reported reduction of pain after manual therapy and further reduction after estim/MHP.  Pt will benefit from continued PT intervention to assist with improved mobility with less pain.    Examination-Activity Limitations  Bed Mobility;Sleep;Stand;Locomotion Level    Rehab Potential  Good    PT Frequency  2x / week    PT Duration  6 weeks    PT Treatment/Interventions  ADLs/Self Care Home Management;Cryotherapy;Electrical Stimulation;Ultrasound;Moist Heat;Traction;Iontophoresis 4mg /ml Dexamethasone;Gait training;Stair training;Functional mobility training;Therapeutic activities;Therapeutic exercise;Neuromuscular re-education;Patient/family education;Manual techniques;Taping    PT Next Visit Plan  continue to assess remaining goals and readiness to d/c vs hold.  10th visit note.    PT Home Exercise Plan  Access Code: [email or web address]    POEUMPNTI and Agree with Plan of Care  Patient       Patient will benefit from skilled therapeutic intervention in order to improve the following deficits and impairments:  Difficulty walking, Pain, Increased muscle spasms, Increased fascial restricitons, Impaired flexibility, Decreased strength  Visit Diagnosis: 1. Chronic right-sided low back pain with right-sided sciatica   2. Muscle weakness (generalized)        Problem List Patient Active Problem List   Diagnosis Date Noted  . Primary osteoarthritis of left knee 08/14/2018  . Primary localized osteoarthritis of right knee  05/03/2016  . Primary osteoarthritis of right knee 05/03/2016  . Abnormal auditory perception of both ears 12/01/2015   Kerin Perna, PTA 01/29/19  9:59 AM  [facility] [place], [place], [address] Phone: [phone]   Fax:  [phone]  Name: Robin Christensen MRN: [id number] Date of Birth: [birth year]

## 2019-01-31 ENCOUNTER — Other Ambulatory Visit: Payer: Self-pay | Admitting: Cardiology

## 2019-01-31 NOTE — Telephone Encounter (Signed)
Please fill if necessary

## 2019-02-01 ENCOUNTER — Ambulatory Visit: Payer: Medicare HMO | Admitting: Physical Therapy

## 2019-02-01 ENCOUNTER — Encounter: Payer: Self-pay | Admitting: Physical Therapy

## 2019-02-01 ENCOUNTER — Other Ambulatory Visit: Payer: Self-pay

## 2019-02-01 DIAGNOSIS — G8929 Other chronic pain: Secondary | ICD-10-CM | POA: Diagnosis not present

## 2019-02-01 DIAGNOSIS — M6281 Muscle weakness (generalized): Secondary | ICD-10-CM | POA: Diagnosis not present

## 2019-02-01 DIAGNOSIS — M5441 Lumbago with sciatica, right side: Secondary | ICD-10-CM | POA: Diagnosis not present

## 2019-02-01 NOTE — Patient Instructions (Signed)
Access Code: [email or web address]  URL: [email or web address]  Date: 02/01/2019  Prepared by: Faustino Congress   Exercises  Supine Single Knee to Chest - 10 reps - 1 sets - 1-2 second hold - 2x daily - 7x weekly  Supine Piriformis Stretch - 10 reps - 1 sets - 1-2 seconds hold - 2x daily - 7x weekly  Supine Sciatic Nerve Glide - 10 reps - 1 sets - 1-2 seconds hold - 2x daily - 7x weekly  Prone Press Up on Elbows - 5 reps - 1 sets - 5 second hold - 2x daily - 7x weekly  Standing Hip Abduction with Counter Support - 20 reps - 1 sets - 1x daily - 7x weekly  Right Standing Lateral Shift Correction at Wall - Repetitions - 10 reps - 1 sets - 10 sec hold - 1x daily - 7x weekly

## 2019-02-01 NOTE — Telephone Encounter (Signed)
Please call patient and see if she is still taking. In allscripts, she had considered stopping

## 2019-02-01 NOTE — Telephone Encounter (Signed)
Lmom for pt to call back. 

## 2019-02-01 NOTE — Therapy (Addendum)
[place], [place], [address] Phone: [phone]   Fax:  [phone]  Physical Therapy Treatment/Progress Note/Discharge  Patient Details  Name: Robin Christensen MRN: [id number] Date of Birth: [birth year] Referring Provider (PT): Melrose Nakayama, MD   Encounter Date: 02/01/2019   Progress Note Reporting Period 12/27/2018 to 02/01/2019  See note below for Objective Data and Assessment of Progress/Goals.       PT End of Session - 02/01/19 1016    Visit Number  10    Number of Visits  12    Date for PT Re-Evaluation  02/07/19    PT Start Time  0932    PT Stop Time  1011    PT Time Calculation (min)  39 min    Activity Tolerance  Patient tolerated treatment well    Behavior During Therapy  [facility] for tasks assessed/performed       Past Medical History:  Diagnosis Date  . Cancer [facility])    Right Breast Cancer  . Dysrhythmia    AF  . Hyperlipidemia   . Hypertension   . Hypothyroidism   . Obesity   . Pneumonia   . PONV (postoperative nausea and vomiting)   . Sleep apnea    cpcap    Past Surgical History:  Procedure Laterality Date  . BREAST SURGERY    . CARDIOVERSION N/A 06/26/2014   Procedure: CARDIOVERSION;  Surgeon: Laverda Page, MD;  Location: [place];  Service: Cardiovascular;  Laterality: N/A;  . CHOLECYSTECTOMY    . TOTAL KNEE ARTHROPLASTY Right 05/03/2016   Procedure: TOTAL KNEE ARTHROPLASTY;  Surgeon: Melrose Nakayama, MD;  Location: [place];  Service: Orthopedics;  Laterality: Right;  . TOTAL KNEE ARTHROPLASTY Left 08/14/2018   Procedure: TOTAL KNEE ARTHROPLASTY;  Surgeon: Melrose Nakayama, MD;  Location: [place];  Service: Orthopedics;  Laterality: Left;    There were no vitals filed for this visit.  Subjective Assessment - 02/01/19 0934    Subjective  doing better today.    Diagnostic tests  MRI: Disc degeneration at L2-3 to L4-5    Patient Stated Goals  improve pain,  return to walking    Currently in Pain?  Yes    Pain Score  2    "I'm just aware I have a knot there"   Pain Location  Buttocks    Pain Orientation  Right    Pain Descriptors / Indicators  Sharp    Pain Type  Acute pain    Pain Onset  More than a month ago    Pain Frequency  Constant    Aggravating Factors   bending over    Pain Relieving Factors  exercises, prolonged sitting, medicine                       [facility] Adult PT Treatment/Exercise - 02/01/19 0936      Lumbar Exercises: Stretches   Prone on Elbows Stretch  5 reps;30 seconds   2 sets   Piriformis Stretch  Right;3 reps;30 seconds      Lumbar Exercises: Aerobic   Nustep  L4 x 7 min      Knee/Hip Exercises: Standing   Hip Abduction  Stengthening;Both;15 reps;Knee straight      Manual Therapy   Manual Therapy  Soft tissue mobilization;Myofascial release    Soft tissue mobilization  TPR to Rt hip rotators and abductors    Myofascial Release  Rt glute  Trigger Point Dry Needling - 02/01/19 1015    Consent Given?  Yes    Education Handout Provided  Previously provided    Muscles Treated Back/Hip  Gluteus maximus;Piriformis    Gluteus Maximus Response  Twitch response elicited;Palpable increased muscle length    Piriformis Response  Twitch response elicited;Palpable increased muscle length                PT Long Term Goals - 02/01/19 1016      PT LONG TERM GOAL #1   Title  independent with HEP    Status  Achieved      PT LONG TERM GOAL #2   Title  FOTO score improved to </= 45% limited for improved function    Status  Achieved      PT LONG TERM GOAL #3   Title  report pain < 3/10 for improved function and mobility    Status  Achieved      PT LONG TERM GOAL #4   Title  report 50% improvement in bed mobility and sleeping for improved pain and function    Status  Achieved      PT LONG TERM GOAL #5   Title  n/a            Plan - 02/01/19 1016    Clinical Impression  Statement  Pt has met all goals and is doing well at this time.  Pt requesting to hold in case symptoms return.  Will hold PT x 30 days and reassess if pt returns, if not will plan to d/c.    Examination-Activity Limitations  Bed Mobility;Sleep;Stand;Locomotion Level    Rehab Potential  Good    PT Frequency  2x / week    PT Duration  6 weeks    PT Treatment/Interventions  ADLs/Self Care Home Management;Cryotherapy;Electrical Stimulation;Ultrasound;Moist Heat;Traction;Iontophoresis 4m/ml Dexamethasone;Gait training;Stair training;Functional mobility training;Therapeutic activities;Therapeutic exercise;Neuromuscular re-education;Patient/family education;Manual techniques;Taping    PT Next Visit Plan  hold x 30 days, reassess if returns, otherwise d/c    PT Home Exercise Plan  Access Code: [email or web address]   Consulted and Agree with Plan of Care  Patient       Patient will benefit from skilled therapeutic intervention in order to improve the following deficits and impairments:  Difficulty walking, Pain, Increased muscle spasms, Increased fascial restricitons, Impaired flexibility, Decreased strength  Visit Diagnosis: 1. Muscle weakness (generalized)   2. Chronic right-sided low back pain with right-sided sciatica        Problem List Patient Active Problem List   Diagnosis Date Noted  . Primary osteoarthritis of left knee 08/14/2018  . Primary localized osteoarthritis of right knee 05/03/2016  . Primary osteoarthritis of right knee 05/03/2016  . Abnormal auditory perception of both ears 12/01/2015      SLaureen Abrahams PT, DPT 02/01/19 10:19 AM      [facility] [address] [address]Phone: [phone]  Fax:  [phone] Name: Robin CALKMRN: [id number] of Birth: 2May 24, 1952     PHYSICAL THERAPY DISCHARGE SUMMARY  Visits from Start of Care: 10  Current functional level related  to goals / functional outcomes: See above   Remaining deficits: n/a   Education / Equipment: HEP  Plan: Patient agrees to discharge.  Patient goals were met. Patient is being discharged due to meeting the stated rehab goals.  ?????    SLaureen Abrahams PT, DPT 03/11/19 3:47 PM  [facility] [place], [place] [address]  [phone] (office) [phone] (fax)

## 2019-02-06 NOTE — Telephone Encounter (Signed)
Still have not reached pt

## 2019-02-12 ENCOUNTER — Other Ambulatory Visit: Payer: Self-pay | Admitting: Neurology

## 2019-02-12 DIAGNOSIS — G44219 Episodic tension-type headache, not intractable: Secondary | ICD-10-CM

## 2019-03-07 DIAGNOSIS — M5430 Sciatica, unspecified side: Secondary | ICD-10-CM | POA: Diagnosis not present

## 2019-03-07 DIAGNOSIS — E039 Hypothyroidism, unspecified: Secondary | ICD-10-CM | POA: Diagnosis not present

## 2019-03-07 DIAGNOSIS — E538 Deficiency of other specified B group vitamins: Secondary | ICD-10-CM | POA: Diagnosis not present

## 2019-03-07 DIAGNOSIS — E785 Hyperlipidemia, unspecified: Secondary | ICD-10-CM | POA: Diagnosis not present

## 2019-03-07 DIAGNOSIS — R32 Unspecified urinary incontinence: Secondary | ICD-10-CM | POA: Diagnosis not present

## 2019-03-07 DIAGNOSIS — Z0000 Encounter for general adult medical examination without abnormal findings: Secondary | ICD-10-CM | POA: Diagnosis not present

## 2019-03-07 DIAGNOSIS — I10 Essential (primary) hypertension: Secondary | ICD-10-CM | POA: Diagnosis not present

## 2019-03-07 DIAGNOSIS — I4891 Unspecified atrial fibrillation: Secondary | ICD-10-CM | POA: Diagnosis not present

## 2019-03-21 DIAGNOSIS — I4891 Unspecified atrial fibrillation: Secondary | ICD-10-CM | POA: Diagnosis not present

## 2019-03-21 DIAGNOSIS — E785 Hyperlipidemia, unspecified: Secondary | ICD-10-CM | POA: Diagnosis not present

## 2019-03-21 DIAGNOSIS — Z0000 Encounter for general adult medical examination without abnormal findings: Secondary | ICD-10-CM | POA: Diagnosis not present

## 2019-03-21 DIAGNOSIS — E538 Deficiency of other specified B group vitamins: Secondary | ICD-10-CM | POA: Diagnosis not present

## 2019-03-21 DIAGNOSIS — E039 Hypothyroidism, unspecified: Secondary | ICD-10-CM | POA: Diagnosis not present

## 2019-03-27 DIAGNOSIS — M545 Low back pain: Secondary | ICD-10-CM | POA: Diagnosis not present

## 2019-03-27 DIAGNOSIS — M25551 Pain in right hip: Secondary | ICD-10-CM | POA: Diagnosis not present

## 2019-04-02 DIAGNOSIS — Z961 Presence of intraocular lens: Secondary | ICD-10-CM | POA: Diagnosis not present

## 2019-04-02 DIAGNOSIS — H524 Presbyopia: Secondary | ICD-10-CM | POA: Diagnosis not present

## 2019-04-04 DIAGNOSIS — Z23 Encounter for immunization: Secondary | ICD-10-CM | POA: Diagnosis not present

## 2019-04-05 DIAGNOSIS — M533 Sacrococcygeal disorders, not elsewhere classified: Secondary | ICD-10-CM | POA: Diagnosis not present

## 2019-04-30 DIAGNOSIS — M533 Sacrococcygeal disorders, not elsewhere classified: Secondary | ICD-10-CM | POA: Diagnosis not present

## 2019-05-14 DIAGNOSIS — E538 Deficiency of other specified B group vitamins: Secondary | ICD-10-CM | POA: Diagnosis not present

## 2019-05-17 DIAGNOSIS — Z6841 Body Mass Index (BMI) 40.0 and over, adult: Secondary | ICD-10-CM | POA: Diagnosis not present

## 2019-05-17 DIAGNOSIS — R351 Nocturia: Secondary | ICD-10-CM | POA: Diagnosis not present

## 2019-05-17 DIAGNOSIS — N3946 Mixed incontinence: Secondary | ICD-10-CM | POA: Diagnosis not present

## 2019-05-17 DIAGNOSIS — R350 Frequency of micturition: Secondary | ICD-10-CM | POA: Diagnosis not present

## 2019-05-20 DIAGNOSIS — Z1231 Encounter for screening mammogram for malignant neoplasm of breast: Secondary | ICD-10-CM | POA: Diagnosis not present

## 2019-05-20 DIAGNOSIS — Z853 Personal history of malignant neoplasm of breast: Secondary | ICD-10-CM | POA: Diagnosis not present

## 2019-05-27 DIAGNOSIS — M5416 Radiculopathy, lumbar region: Secondary | ICD-10-CM | POA: Diagnosis not present

## 2019-06-04 DIAGNOSIS — Z01818 Encounter for other preprocedural examination: Secondary | ICD-10-CM | POA: Diagnosis not present

## 2019-06-04 DIAGNOSIS — I4891 Unspecified atrial fibrillation: Secondary | ICD-10-CM | POA: Diagnosis not present

## 2019-06-04 DIAGNOSIS — R635 Abnormal weight gain: Secondary | ICD-10-CM | POA: Diagnosis not present

## 2019-06-04 DIAGNOSIS — I10 Essential (primary) hypertension: Secondary | ICD-10-CM | POA: Diagnosis not present

## 2019-06-04 DIAGNOSIS — M549 Dorsalgia, unspecified: Secondary | ICD-10-CM | POA: Diagnosis not present

## 2019-06-11 ENCOUNTER — Other Ambulatory Visit: Payer: Self-pay | Admitting: Neurology

## 2019-06-11 DIAGNOSIS — G44219 Episodic tension-type headache, not intractable: Secondary | ICD-10-CM

## 2019-06-11 NOTE — Telephone Encounter (Signed)
Requested Prescriptions   Pending Prescriptions Disp Refills  . zonisamide (ZONEGRAN) 50 MG capsule [Pharmacy Med Name: Zonisamide 50 MG Oral Capsule] 30 capsule 0    Sig: Take 1 capsule by mouth at bedtime   Rx last filled: 02/12/19 #30 3 refills  Pt last seen: 01/04/19  Follow up appt scheduled:none

## 2019-06-18 DIAGNOSIS — M5416 Radiculopathy, lumbar region: Secondary | ICD-10-CM | POA: Diagnosis not present

## 2019-07-08 DIAGNOSIS — M5136 Other intervertebral disc degeneration, lumbar region: Secondary | ICD-10-CM | POA: Diagnosis not present

## 2019-07-25 ENCOUNTER — Ambulatory Visit (INDEPENDENT_AMBULATORY_CARE_PROVIDER_SITE_OTHER): Payer: Medicare HMO | Admitting: Family Medicine

## 2019-07-25 ENCOUNTER — Encounter (INDEPENDENT_AMBULATORY_CARE_PROVIDER_SITE_OTHER): Payer: Self-pay | Admitting: Family Medicine

## 2019-07-25 ENCOUNTER — Other Ambulatory Visit: Payer: Self-pay

## 2019-07-25 VITALS — BP 134/73 | HR 64 | Temp 97.9°F | Ht 68.0 in | Wt 296.0 lb

## 2019-07-25 DIAGNOSIS — G4733 Obstructive sleep apnea (adult) (pediatric): Secondary | ICD-10-CM

## 2019-07-25 DIAGNOSIS — E538 Deficiency of other specified B group vitamins: Secondary | ICD-10-CM | POA: Diagnosis not present

## 2019-07-25 DIAGNOSIS — Z0289 Encounter for other administrative examinations: Secondary | ICD-10-CM

## 2019-07-25 DIAGNOSIS — E038 Other specified hypothyroidism: Secondary | ICD-10-CM

## 2019-07-25 DIAGNOSIS — I4891 Unspecified atrial fibrillation: Secondary | ICD-10-CM | POA: Diagnosis not present

## 2019-07-25 DIAGNOSIS — R5383 Other fatigue: Secondary | ICD-10-CM | POA: Diagnosis not present

## 2019-07-25 DIAGNOSIS — E7800 Pure hypercholesterolemia, unspecified: Secondary | ICD-10-CM | POA: Diagnosis not present

## 2019-07-25 DIAGNOSIS — M549 Dorsalgia, unspecified: Secondary | ICD-10-CM

## 2019-07-25 DIAGNOSIS — I10 Essential (primary) hypertension: Secondary | ICD-10-CM

## 2019-07-25 DIAGNOSIS — Z6841 Body Mass Index (BMI) 40.0 and over, adult: Secondary | ICD-10-CM

## 2019-07-25 DIAGNOSIS — R0602 Shortness of breath: Secondary | ICD-10-CM

## 2019-07-25 DIAGNOSIS — E7849 Other hyperlipidemia: Secondary | ICD-10-CM

## 2019-07-25 DIAGNOSIS — R718 Other abnormality of red blood cells: Secondary | ICD-10-CM | POA: Diagnosis not present

## 2019-07-25 DIAGNOSIS — F3289 Other specified depressive episodes: Secondary | ICD-10-CM | POA: Diagnosis not present

## 2019-07-25 DIAGNOSIS — G8929 Other chronic pain: Secondary | ICD-10-CM

## 2019-07-26 LAB — CBC WITH DIFFERENTIAL/PLATELET
Basophils Absolute: 0 10*3/uL (ref 0.0–0.2)
Basos: 0 %
EOS (ABSOLUTE): 0.1 10*3/uL (ref 0.0–0.4)
Eos: 2 %
Hemoglobin: 14.9 g/dL (ref 11.1–15.9)
Immature Grans (Abs): 0.1 10*3/uL (ref 0.0–0.1)
Immature Granulocytes: 1 %
Lymphocytes Absolute: 1.7 10*3/uL (ref 0.7–3.1)
Lymphs: 26 %
MCH: 28.4 pg (ref 26.6–33.0)
MCHC: 31.9 g/dL (ref 31.5–35.7)
MCV: 89 fL (ref 79–97)
Monocytes Absolute: 0.4 10*3/uL (ref 0.1–0.9)
Monocytes: 6 %
Neutrophils Absolute: 4.2 10*3/uL (ref 1.4–7.0)
Neutrophils: 65 %
Platelets: 257 10*3/uL (ref 150–450)
RBC: 5.24 x10E6/uL (ref 3.77–5.28)
RDW: 13.3 % (ref 11.7–15.4)
WBC: 6.4 10*3/uL (ref 3.4–10.8)

## 2019-07-26 LAB — ANEMIA PANEL
Ferritin: 91 ng/mL (ref 15–150)
Folate, Hemolysate: 361 ng/mL
Folate, RBC: 773 ng/mL (ref >498)
Hematocrit: 46.7 % — ABNORMAL HIGH (ref 34.0–46.6)
Iron Saturation: 26 % (ref 15–55)
Iron: 97 ug/dL (ref 27–139)
Retic Ct Pct: 1.6 % (ref 0.6–2.6)
Total Iron Binding Capacity: 374 ug/dL (ref 250–450)
UIBC: 277 ug/dL (ref 118–369)
Vitamin B-12: 487 pg/mL (ref 232–1245)

## 2019-07-26 LAB — COMPREHENSIVE METABOLIC PANEL
ALT: 9 IU/L (ref 0–32)
AST: 17 IU/L (ref 0–40)
Albumin/Globulin Ratio: 1.7 (ref 1.2–2.2)
Albumin: 4.3 g/dL (ref 3.8–4.8)
Alkaline Phosphatase: 128 IU/L — ABNORMAL HIGH (ref 39–117)
BUN/Creatinine Ratio: 21 (ref 12–28)
BUN: 16 mg/dL (ref 8–27)
Bilirubin Total: 0.5 mg/dL (ref 0.0–1.2)
CO2: 21 mmol/L (ref 20–29)
Calcium: 10.1 mg/dL (ref 8.7–10.3)
Chloride: 103 mmol/L (ref 96–106)
Creatinine, Ser: 0.78 mg/dL (ref 0.57–1.00)
GFR calc Af Amer: 90 mL/min/{1.73_m2} (ref >59)
GFR calc non Af Amer: 78 mL/min/{1.73_m2} (ref >59)
Globulin, Total: 2.5 g/dL (ref 1.5–4.5)
Glucose: 96 mg/dL (ref 65–99)
Potassium: 4.3 mmol/L (ref 3.5–5.2)
Sodium: 140 mmol/L (ref 134–144)
Total Protein: 6.8 g/dL (ref 6.0–8.5)

## 2019-07-26 LAB — LIPID PANEL WITH LDL/HDL RATIO
Cholesterol, Total: 218 mg/dL — ABNORMAL HIGH (ref 100–199)
HDL: 73 mg/dL (ref >39)
LDL Chol Calc (NIH): 114 mg/dL — ABNORMAL HIGH (ref 0–99)
LDL/HDL Ratio: 1.6 ratio (ref 0.0–3.2)
Triglycerides: 179 mg/dL — ABNORMAL HIGH (ref 0–149)
VLDL Cholesterol Cal: 31 mg/dL (ref 5–40)

## 2019-07-26 LAB — T4, FREE: Free T4: 1.2 ng/dL (ref 0.82–1.77)

## 2019-07-26 LAB — T3: T3, Total: 131 ng/dL (ref 71–180)

## 2019-07-26 LAB — HEMOGLOBIN A1C
Est. average glucose Bld gHb Est-mCnc: 108 mg/dL
Hgb A1c MFr Bld: 5.4 % (ref 4.8–5.6)

## 2019-07-26 LAB — TSH: TSH: 2.01 u[IU]/mL (ref 0.450–4.500)

## 2019-07-26 LAB — INSULIN, RANDOM: INSULIN: 10.3 u[IU]/mL (ref 2.6–24.9)

## 2019-07-26 LAB — VITAMIN D 25 HYDROXY (VIT D DEFICIENCY, FRACTURES): Vit D, 25-Hydroxy: 23.5 ng/mL — ABNORMAL LOW (ref 30.0–100.0)

## 2019-07-30 ENCOUNTER — Encounter (INDEPENDENT_AMBULATORY_CARE_PROVIDER_SITE_OTHER): Payer: Self-pay | Admitting: Family Medicine

## 2019-07-30 NOTE — Progress Notes (Signed)
Dear Dr. Orland Mustard,   Thank you for referring Robin Christensen to our clinic. The following note includes my evaluation and treatment recommendations.  Chief Complaint:   OBESITY Robin Christensen (MR# [id number]) is a 68 y.o. female who presents for evaluation and treatment of obesity and related comorbidities. Current BMI is Body mass index is 45.01 kg/m.  Robin Christensen has been struggling with her weight for many years and has been unsuccessful in either losing weight, maintaining weight loss, or reaching her healthy weight goal.  Robin Christensen states she is currently in the action stage of change and ready to dedicate time achieving and maintaining a healthier weight. Robin Christensen is interested in becoming our patient and working on intensive lifestyle modifications including (but not limited to) diet and exercise for weight loss.  Robin Christensen's habits were reviewed today and are as follows: her desired weight loss is 115 pounds, she has been heavy most of her life, she has gained and lost weight off and on throughout her entire life, her heaviest weight ever was 350 pounds, she craves chocolate/sugar, pasta, pizza, sweets, and crackers with peanut butter, she snacks frequently in the evenings, she wakes up once in awhile in the middle of the night to eat, she frequently makes poor food choices, she has problems with excessive hunger, she frequently eats larger portions than normal, she has binge eating behaviors and she struggles with emotional eating.  Depression Screen Robin Christensen's Food and Mood (modified PHQ-9) score was 13.  Depression screen PHQ 2/9 07/25/2019  Decreased Interest 3  Down, Depressed, Hopeless 1  PHQ - 2 Score 4  Altered sleeping 1  Tired, decreased energy 3  Change in appetite 2  Feeling bad or failure about yourself  2  Trouble concentrating 0  Moving slowly or fidgety/restless 1  Suicidal thoughts 0  PHQ-9 Score 13  Difficult doing work/chores Not difficult at all    Subjective:   1. Other fatigue Robin Christensen feels her energy is lower than it should be. This has worsened with weight gain and has worsened recently. Robin Christensen denies daytime somnolence and admits to waking up still tired. Patient has a diagnosis of obstructive sleep apnea.  Patient generally gets 5 hours of sleep per night, and states they generally have poor sleep quality. Snoring is not present.  Apneic episodes are not present.  Patient uses a CPAP machine.  Epworth Sleepiness Score is 10.  2. Shortness of breath on exertion Robin Christensen notes increasing shortness of breath with exercising and seems to be worsening over time with weight gain. She notes getting out of breath sooner with activity than she used to. This has gotten worse recently. Robin Christensen denies shortness of breath at rest or orthopnea.  3. Other specified hypothyroidism Robin Christensen is currently taking Synthroid 75 mcg daily.  Lab Results  Component Value Date   TSH 2.010 07/25/2019   4. Essential hypertension Review: taking medications as instructed, no medication side effects noted, no chest pain on exertion, no dyspnea on exertion, no swelling of ankles.   BP Readings from Last 3 Encounters:  07/25/19 134/73  09/19/18 140/79  09/17/18 131/73   5. Other hyperlipidemia Robin Christensen has hyperlipidemia and has been trying to improve her cholesterol levels with intensive lifestyle modification including a low saturated fat diet, exercise and weight loss. She denies any chest pain, claudication or myalgias.  Lab Results  Component Value Date   ALT 9 07/25/2019   AST 17 07/25/2019   ALKPHOS 128 (H) 07/25/2019  BILITOT 0.5 07/25/2019   Lab Results  Component Value Date   CHOL 218 (H) 07/25/2019   HDL 73 07/25/2019   LDLCALC 114 (H) 07/25/2019   TRIG 179 (H) 07/25/2019   6. B12 nutritional deficiency She is not a vegetarian.  She does not have a previous diagnosis of pernicious anemia.  She does not have a history of weight  loss surgery.   Lab Results  Component Value Date   [id number] 07/25/2019   7. Atrial fibrillation, unspecified type Robin Christensen) Robin Christensen is currently taking Eliquis 5 mg twice daily for her atrial fibrillation.  8. Other depression, with emotional eating Robin Christensen is struggling with emotional eating and using food for comfort to the extent that it is negatively impacting her health. She often snacks when she is not hungry. [name] sometimes feels she is out of control and then feels guilty that she made poor food choices. She has been working on behavior modification techniques to help reduce her emotional eating and has been unsuccessful. She shows no sign of suicidal or homicidal ideations.  9. OSA (obstructive sleep apnea) Robin Christensen has a diagnosis of OSA.  Her Epworth Sleepiness Score is 10, and she uses a CPAP machine.  10. Chronic back pain, unspecified back location, unspecified back pain laterality Robin Christensen has chronic back pain.  She takes gabapentin for this and also gets epidural steroid injections with Dr. Mina Marble.  Assessment/Plan:   1. Other fatigue Robin Christensen was informed fatigue may be related to obesity, depression or many other causes. Labs will be ordered, and in the meanwhile Robin Christensen has agreed to work on diet, exercise and weight loss.  Orders - EKG 12-Lead - Vitamin D (25 hydroxy) - Anemia panel - CBC w/Diff/Platelet - Comprehensive Metabolic Panel (CMET) - HgB A1c - Insulin, random  2. Shortness of breath on exertion Robin Christensen's shortness of breath appears to be obesity related and exercise induced. She has agreed to work on weight loss and gradually increase exercise to treat her exercise induced shortness of breath. Will continue to monitor closely.  3. Other specified hypothyroidism Patient with long-standing hypothyroidism, on levothyroxine therapy. She appears euthyroid. Orders and follow up as documented in patient record.  Counseling . Good thyroid  control is important for overall health. Supratherapeutic thyroid levels are dangerous and will not improve weight loss results. . The correct way to take levothyroxine is fasting, with water, separated by at least 30 minutes from breakfast, and separated by more than 4 hours from calcium, iron, multivitamins, acid reflux medications (PPIs).   Orders - T3 - T4, free - TSH  4. Essential hypertension Robin Christensen is working on healthy weight loss and exercise to improve blood pressure control. We will watch for signs of hypotension as she continues her lifestyle modifications.  5. Other hyperlipidemia Cardiovascular risk and specific lipid/LDL goals reviewed.  We discussed several lifestyle modifications today and Joretha will continue to work on diet, exercise and weight loss efforts. Orders and follow up as documented in patient record.   Counseling Intensive lifestyle modifications are the first line treatment for this issue. . Dietary changes: Increase soluble fiber. Decrease simple carbohydrates. . Exercise changes: Moderate to vigorous-intensity aerobic activity 150 minutes per week if tolerated. . Lipid-lowering medications: see documented in medical record.  Orders - Lipid Panel With LDL/HDL Ratio  6. B12 nutritional deficiency The diagnosis was reviewed with the patient. Counseling provided today, see below. We will continue to monitor. Orders and follow up as documented in patient record.  Counseling .  The body needs vitamin B12: to make red blood cells; to make DNA; and to help the nerves work properly so they can carry messages from the brain to the body.  . The main causes of vitamin B12 deficiency include dietary deficiency, digestive diseases, pernicious anemia, and having a surgery in which part of the stomach or small intestine is removed.  . Certain medicines can make it harder for the body to absorb vitamin B12. These medicines include: heartburn medications; some  antibiotics; some medications used to treat diabetes, gout, and high cholesterol.  . In some cases, there are no symptoms of this condition. If the condition leads to anemia or nerve damage, various symptoms can occur, such as weakness or fatigue, shortness of breath, and numbness or tingling in your hands and feet.   . Treatment:  o May include taking vitamin B12 supplements.  o Avoid alcohol.  o Eat lots of healthy foods that contain vitamin B12: - Beef, pork, chicken, [place], and organ meats, such as liver.  - Seafood: This includes clams, rainbow trout, salmon, tuna, and haddock. Eggs.  - Cereal and dairy products that are fortified: This means that vitamin B12 has been added to the food.   Orders - Anemia panel  7. Atrial fibrillation, unspecified type (Robin Christensen) Will monitor.  8. Other depression, with emotional eating Behavior modification techniques were discussed today to help Kyrie deal with her emotional/non-hunger eating behaviors.  Orders and follow up as documented in patient record.   9. OSA (obstructive sleep apnea) Intensive lifestyle modifications are the first line treatment for this issue. We discussed several lifestyle modifications today and she will continue to work on diet, exercise and weight loss efforts. We will continue to monitor. Orders and follow up as documented in patient record.   Counseling  Sleep apnea is a condition in which breathing pauses or becomes shallow during sleep. This happens over and over during the night. This disrupts your sleep and keeps your body from getting the rest that it needs, which can cause tiredness and lack of energy (fatigue) during the day.  Sleep apnea treatment: If you were given a device to open your airway while you sleep, USE IT!  Sleep hygiene:   Limit or avoid alcohol, caffeinated beverages, and cigarettes, especially close to bedtime.   Do not eat a large meal or eat spicy foods right before bedtime. This can lead  to digestive discomfort that can make it hard for you to sleep.  Keep a sleep diary to help you and your health care provider figure out what could be causing your insomnia.  . Make your bedroom a dark, comfortable place where it is easy to fall asleep. ? Put up shades or blackout curtains to block light from outside. ? Use a white noise machine to block noise. ? Keep the temperature cool. . Limit screen use before bedtime. This includes: ? Watching TV. ? Using your smartphone, tablet, or computer. . Stick to a routine that includes going to bed and waking up at the same times every day and night. This can help you fall asleep faster. Consider making a quiet activity, such as reading, part of your nighttime routine. . Try to avoid taking naps during the day so that you sleep better at night. . Get out of bed if you are still awake after 15 minutes of trying to sleep. Keep the lights down, but try reading or doing a quiet activity. When you feel sleepy, go back to  bed.  10. Chronic back pain, unspecified back location, unspecified back pain laterality Will continue to monitor.  11. Class 3 severe obesity with serious comorbidity and body mass index (BMI) of 45.0 to 49.9 in adult, unspecified obesity type [facility]) Ersula is currently in the action stage of change and her goal is to continue with weight loss efforts. I recommend Jamaiya begin the structured treatment plan as follows:  She has agreed to on the Category 1 Plan.  We discussed the following exercise goals today: Older adults should follow the adult guidelines. When older adults cannot meet the adult guidelines, they should be as physically active as their abilities and conditions will allow.  Older adults should do exercises that maintain or improve balance if they are at risk of falling.    We discussed the following behavioral modification strategies today: increasing lean protein intake, decreasing simple carbohydrates,  increasing vegetables, increasing water intake and decreasing liquid calories.  She was informed of the importance of frequent follow-up visits to maximize her success with intensive lifestyle modifications for her multiple health conditions. She was informed we would discuss her lab results at her next visit unless there is a critical issue that needs to be addressed sooner. Shandelle agreed to keep her next visit at the agreed upon time to discuss these results.  Objective:   Blood pressure 134/73, pulse 64, temperature 97.9 F (36.6 C), temperature source Oral, height 5\' 8"  (1.727 m), weight 296 lb (134.3 kg), SpO2 96 %. Body mass index is 45.01 kg/m.  EKG: Normal sinus rhythm, rate 62 bpm.  Indirect Calorimeter completed today shows a VO2 of 208 and a REE of 1449.  Her calculated basal metabolic rate is [id number] thus her basal metabolic rate is worse than expected.  General: Cooperative, alert, well developed, in no acute distress. HEENT: Conjunctivae and lids unremarkable. Neck: No thyromegaly.  Cardiovascular: Regular rhythm.  Lungs: Normal work of breathing. Extremities: No edema.  Neurologic: No focal deficits.   Lab Results  Component Value Date   CREATININE 0.78 07/25/2019   BUN 16 07/25/2019   NA 140 07/25/2019   K 4.3 07/25/2019   CL 103 07/25/2019   CO2 21 07/25/2019   Lab Results  Component Value Date   ALT 9 07/25/2019   AST 17 07/25/2019   ALKPHOS 128 (H) 07/25/2019   BILITOT 0.5 07/25/2019   Lab Results  Component Value Date   HGBA1C 5.4 07/25/2019   Lab Results  Component Value Date   INSULIN 10.3 07/25/2019   Lab Results  Component Value Date   TSH 2.010 07/25/2019   Lab Results  Component Value Date   CHOL 218 (H) 07/25/2019   HDL 73 07/25/2019   LDLCALC 114 (H) 07/25/2019   TRIG 179 (H) 07/25/2019   Lab Results  Component Value Date   WBC 6.4 07/25/2019   HGB 14.9 07/25/2019   HCT 46.7 (H) 07/25/2019   MCV 89 07/25/2019   PLT 257  07/25/2019   Lab Results  Component Value Date   IRON 97 07/25/2019   TIBC 374 07/25/2019   FERRITIN 91 07/25/2019   Obesity Behavioral Intervention Visit Documentation for Insurance:   Approximately 15 minutes were spent on the discussion below.  ASK: We discussed the diagnosis of obesity with [name] today and Kynslie agreed to give [name] permission to discuss obesity behavioral modification therapy today.  ASSESS: Adell has the diagnosis of obesity and her BMI today is 45.1. Naidelin is in the action  stage of change.   ADVISE: Kerston was educated on the multiple health risks of obesity as well as the benefit of weight loss to improve her health. She was advised of the need for long term treatment and the importance of lifestyle modifications to improve her current health and to decrease her risk of future health problems.  AGREE: Multiple dietary modification options and treatment options were discussed and Constanza agreed to follow the recommendations documented in the above note.  ARRANGE: Velissa was educated on the importance of frequent visits to treat obesity as outlined per CMS and USPSTF guidelines and agreed to schedule her next follow up appointment today.  Attestation Statements:   Reviewed by clinician on day of visit: allergies, medications, problem list, medical history, surgical history, family history, social history, and previous encounter notes.  I, Water quality scientist, CMA, am acting as Location manager for [employer], DO.  I have reviewed the above documentation for accuracy and completeness, and I agree with the above. Briscoe Deutscher, DO

## 2019-07-31 DIAGNOSIS — M5136 Other intervertebral disc degeneration, lumbar region: Secondary | ICD-10-CM | POA: Diagnosis not present

## 2019-08-06 ENCOUNTER — Encounter (INDEPENDENT_AMBULATORY_CARE_PROVIDER_SITE_OTHER): Payer: Self-pay | Admitting: Family Medicine

## 2019-08-06 DIAGNOSIS — R351 Nocturia: Secondary | ICD-10-CM | POA: Diagnosis not present

## 2019-08-06 DIAGNOSIS — N3946 Mixed incontinence: Secondary | ICD-10-CM | POA: Diagnosis not present

## 2019-08-06 DIAGNOSIS — R350 Frequency of micturition: Secondary | ICD-10-CM | POA: Diagnosis not present

## 2019-08-06 NOTE — Telephone Encounter (Signed)
Please advise 

## 2019-08-08 ENCOUNTER — Ambulatory Visit (INDEPENDENT_AMBULATORY_CARE_PROVIDER_SITE_OTHER): Payer: Self-pay | Admitting: Family Medicine

## 2019-08-08 ENCOUNTER — Other Ambulatory Visit: Payer: Self-pay

## 2019-08-08 ENCOUNTER — Encounter (INDEPENDENT_AMBULATORY_CARE_PROVIDER_SITE_OTHER): Payer: Self-pay | Admitting: Family Medicine

## 2019-08-08 ENCOUNTER — Ambulatory Visit (INDEPENDENT_AMBULATORY_CARE_PROVIDER_SITE_OTHER): Payer: Medicare HMO | Admitting: Family Medicine

## 2019-08-08 VITALS — BP 123/59 | HR 90 | Temp 98.4°F | Ht 68.0 in | Wt 283.0 lb

## 2019-08-08 DIAGNOSIS — E8881 Metabolic syndrome: Secondary | ICD-10-CM

## 2019-08-08 DIAGNOSIS — E559 Vitamin D deficiency, unspecified: Secondary | ICD-10-CM

## 2019-08-08 DIAGNOSIS — Z6841 Body Mass Index (BMI) 40.0 and over, adult: Secondary | ICD-10-CM

## 2019-08-11 NOTE — Progress Notes (Signed)
Chief Complaint:   OBESITY Latoshia is here to discuss her progress with her obesity treatment plan along with follow-up of her obesity related diagnoses. Janki is on the Category 1 Plan and states she is following her eating plan approximately 95% of the time. Juleah states she is doing 0 minutes 0 times per week.  Today's visit was #: 2 Starting weight: 296 lbs Starting date: 07/25/2019 Today's weight: 283 lbs Today's date: 08/08/2019 Total lbs lost to date: 13 Total lbs lost since last in-office visit: 13  Interim History: Etherine is hungry between breakfast and lunch, and occasional hunger between lunch and dinner. She struggled with hunger, but she followed the plan as best as she could.  Subjective:   1. Vitamin D deficiency Batool denies nausea, vomiting, or muscle weakness. She is on calcium, Vit D combination. She notes fatigue.  2. Insulin resistance Jrue's last Hgb A1c was 5.4 and insulin of 10.3. She notes occasional carbohydrate cravings.   Assessment/Plan:   1. Vitamin D deficiency Low Vitamin D level contributes to fatigue and are associated with obesity, breast, and colon cancer. Sadira agrees to start prescription Vitamin D 50,000 IU every week and will follow-up for routine testing of Vitamin D, at least 2-3 times per year to avoid over-replacement. We will continue to monitor.  - Vitamin D, Ergocalciferol, (DRISDOL) 1.25 MG (50000 UNIT) CAPS capsule; Take 1 capsule (50,000 Units total) by mouth every 7 (seven) days.  Dispense: 4 capsule; Refill: 0  2. Insulin resistance Jayln will continue to work on weight loss, exercise, and decreasing simple carbohydrates to help decrease the risk of diabetes. No medications are needed at this time. Braylee agreed to follow-up with [name] as directed to closely monitor her progress.  3. Class 3 severe obesity with serious comorbidity and body mass index (BMI) of 40.0 to 44.9 in adult, unspecified obesity type  [facility]) Ilee is currently in the action stage of change. As such, her goal is to continue with weight loss efforts. She has agreed to the Category 2 Plan with 8 oz of protein at dinner.   Exercise goals: Older adults should follow the adult guidelines. When older adults cannot meet the adult guidelines, they should be as physically active as their abilities and conditions will allow.  Older adults should do exercises that maintain or improve balance if they are at risk of falling.   Behavioral modification strategies: increasing lean protein intake, increasing vegetables, meal planning and cooking strategies, keeping healthy foods in the home and planning for success.  Hazelene has agreed to follow-up with our clinic in 2 weeks. She was informed of the importance of frequent follow-up visits to maximize her success with intensive lifestyle modifications for her multiple health conditions.   Objective:   Blood pressure (!) 123/59, pulse 90, temperature 98.4 F (36.9 C), temperature source Oral, height 5\' 8"  (1.727 m), weight 283 lb (128.4 kg), SpO2 96 %. Body mass index is 43.03 kg/m.  General: Cooperative, alert, well developed, in no acute distress. HEENT: Conjunctivae and lids unremarkable. Cardiovascular: Regular rhythm.  Lungs: Normal work of breathing. Neurologic: No focal deficits.   Lab Results  Component Value Date   CREATININE 0.78 07/25/2019   BUN 16 07/25/2019   NA 140 07/25/2019   K 4.3 07/25/2019   CL 103 07/25/2019   CO2 21 07/25/2019   Lab Results  Component Value Date   ALT 9 07/25/2019   AST 17 07/25/2019   ALKPHOS 128 (H) 07/25/2019  BILITOT 0.5 07/25/2019   Lab Results  Component Value Date   HGBA1C 5.4 07/25/2019   Lab Results  Component Value Date   INSULIN 10.3 07/25/2019   Lab Results  Component Value Date   TSH 2.010 07/25/2019   Lab Results  Component Value Date   CHOL 218 (H) 07/25/2019   HDL 73 07/25/2019   LDLCALC 114 (H) 07/25/2019    TRIG 179 (H) 07/25/2019   Lab Results  Component Value Date   WBC 6.4 07/25/2019   HGB 14.9 07/25/2019   HCT 46.7 (H) 07/25/2019   MCV 89 07/25/2019   PLT 257 07/25/2019   Lab Results  Component Value Date   IRON 97 07/25/2019   TIBC 374 07/25/2019   FERRITIN 91 07/25/2019    Obesity Behavioral Intervention Documentation for Insurance:   Approximately 15 minutes were spent on the discussion below.  ASK: We discussed the diagnosis of obesity with [name] today and Kyleah agreed to give [name] permission to discuss obesity behavioral modification therapy today.  ASSESS: Yovanka has the diagnosis of obesity and her BMI today is 43.04. Jeneane is in the action stage of change.   ADVISE: Heyli was educated on the multiple health risks of obesity as well as the benefit of weight loss to improve her health. She was advised of the need for long term treatment and the importance of lifestyle modifications to improve her current health and to decrease her risk of future health problems.  AGREE: Multiple dietary modification options and treatment options were discussed and Ebecca agreed to follow the recommendations documented in the above note.  ARRANGE: Danaisha was educated on the importance of frequent visits to treat obesity as outlined per CMS and USPSTF guidelines and agreed to schedule her next follow up appointment today.  Attestation Statements:   Reviewed by clinician on day of visit: allergies, medications, problem list, medical history, surgical history, family history, social history, and previous encounter notes.   I, Trixie Dredge, am acting as transcriptionist for Ilene Qua, MD.  I have reviewed the above documentation for accuracy and completeness, and I agree with the above. -  AK

## 2019-08-12 MED ORDER — VITAMIN D (ERGOCALCIFEROL) 1.25 MG (50000 UNIT) PO CAPS: 50000.0000 [IU] | ORAL_CAPSULE | ORAL | 0 refills | Status: DC

## 2019-08-16 DIAGNOSIS — R351 Nocturia: Secondary | ICD-10-CM | POA: Diagnosis not present

## 2019-08-16 DIAGNOSIS — N3946 Mixed incontinence: Secondary | ICD-10-CM | POA: Diagnosis not present

## 2019-08-23 DIAGNOSIS — M5416 Radiculopathy, lumbar region: Secondary | ICD-10-CM | POA: Diagnosis not present

## 2019-08-26 DIAGNOSIS — E785 Hyperlipidemia, unspecified: Secondary | ICD-10-CM | POA: Diagnosis not present

## 2019-08-26 DIAGNOSIS — J019 Acute sinusitis, unspecified: Secondary | ICD-10-CM | POA: Diagnosis not present

## 2019-08-26 DIAGNOSIS — E039 Hypothyroidism, unspecified: Secondary | ICD-10-CM | POA: Diagnosis not present

## 2019-08-27 ENCOUNTER — Other Ambulatory Visit: Payer: Self-pay

## 2019-08-27 ENCOUNTER — Ambulatory Visit (INDEPENDENT_AMBULATORY_CARE_PROVIDER_SITE_OTHER): Payer: Medicare HMO | Admitting: Family Medicine

## 2019-08-27 VITALS — BP 114/74 | HR 62 | Temp 98.1°F | Ht 68.0 in | Wt 283.0 lb

## 2019-08-27 DIAGNOSIS — E559 Vitamin D deficiency, unspecified: Secondary | ICD-10-CM | POA: Diagnosis not present

## 2019-08-27 DIAGNOSIS — Z6841 Body Mass Index (BMI) 40.0 and over, adult: Secondary | ICD-10-CM

## 2019-08-27 DIAGNOSIS — E038 Other specified hypothyroidism: Secondary | ICD-10-CM

## 2019-08-27 NOTE — Progress Notes (Signed)
Chief Complaint:   Robin Christensen is here to discuss her progress with her Robin treatment plan along with follow-up of her Robin related diagnoses. Robin Christensen is on the Category 2 Plan + 8 ounces of protein and states she is following her eating plan approximately 100% of the time. Robin Christensen states she is exercising 0 minutes 0 times per week.  Today's visit was #: 3 Starting weight: 296 lbs Starting date: 07/25/2019 Today's weight: 283 lbs Today's date: 08/27/2019 Total lbs lost to date: 13 Total lbs lost since last in-office visit: 0  Interim History: Robin Christensen followed the plan 100% in the past two weeks. She had some hunger between her morning snack and lunch. She is often doing the egg option for breakfast. She did make french toast twice. [name] is looking for variety, like oatmeal in the morning. She has a birthday coming up.  Subjective:   Other specified hypothyroidism .Robin Christensen is on synthroid currently. She denies palpitations, heat or cold intolerance.   Lab Results  Component Value Date   TSH 2.010 07/25/2019   Vitamin D deficiency Robin Christensen Vitamin D level was 23.5 on 07/25/19. She is on vitamin D supplementation. She admits fatigue and denies nausea, vomiting or muscle weakness.  Assessment/Plan:   Other specified hypothyroidism Patient with long-standing hypothyroidism, on levothyroxine therapy. She appears euthyroid. Robin Christensen will continue synthroid. Orders and follow up as documented in patient record.  Counseling . Good thyroid control is important for overall health. Supratherapeutic thyroid levels are dangerous and will not improve weight loss results. . The correct way to take levothyroxine is fasting, with water, separated by at least 30 minutes from breakfast, and separated by more than 4 hours from calcium, iron, multivitamins, acid reflux medications (PPIs).   Vitamin D deficiency Low Vitamin D level contributes to fatigue and are  associated with Robin, breast, and colon cancer. Robin Christensen agrees to continue to take prescription Vitamin D @50 ,000 IU every week and she will follow-up for routine testing of Vitamin D, at least 2-3 times per year to avoid over-replacement.  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 As such, her goal is to continue with weight loss efforts. She has agreed to the Category 2 Plan with 8 ounces of protein.   Exercise goals: No exercise has been prescribed at this time.  Behavioral modification strategies: increasing lean protein intake, increasing vegetables, meal planning and cooking strategies, keeping healthy foods in the home and planning for success. Robin Christensen can do 1 egg, 8 ounces of [employer] fat free milk and 1 packet oatmeal. She will switch lunch and dinner meals.  Robin Christensen has agreed to follow-up with our clinic in 2 weeks. She was informed of the importance of frequent follow-up visits to maximize her success with intensive lifestyle modifications for her multiple health conditions.   Objective:   Blood pressure 114/74, pulse 62, temperature 98.1 F (36.7 C), temperature source Oral, height 5\' 8"  (1.727 m), weight 283 lb (128.4 kg), SpO2 96 %. Body mass index is 43.03 kg/m.  General: Cooperative, alert, well developed, in no acute distress. HEENT: Conjunctivae and lids unremarkable. Cardiovascular: Regular rhythm.  Lungs: Normal work of breathing. Neurologic: No focal deficits.   Lab Results  Component Value Date   CREATININE 0.78 07/25/2019   BUN 16 07/25/2019   NA 140 07/25/2019   K 4.3 07/25/2019  CL 103 07/25/2019   CO2 21 07/25/2019   Lab Results  Component Value Date   ALT 9 07/25/2019   AST 17 07/25/2019   ALKPHOS 128 (H) 07/25/2019   BILITOT 0.5 07/25/2019   Lab Results  Component Value Date   HGBA1C 5.4 07/25/2019    Lab Results  Component Value Date   INSULIN 10.3 07/25/2019   Lab Results  Component Value Date   TSH 2.010 07/25/2019   Lab Results  Component Value Date   CHOL 218 (H) 07/25/2019   HDL 73 07/25/2019   LDLCALC 114 (H) 07/25/2019   TRIG 179 (H) 07/25/2019   Lab Results  Component Value Date   WBC 6.4 07/25/2019   HGB 14.9 07/25/2019   HCT 46.7 (H) 07/25/2019   MCV 89 07/25/2019   PLT 257 07/25/2019   Lab Results  Component Value Date   IRON 97 07/25/2019   TIBC 374 07/25/2019   FERRITIN 91 07/25/2019    Ref. Range 07/25/2019 11:01  Vitamin D, 25-Hydroxy Latest Ref Range: 30.0 - 100.0 ng/mL 23.5 (L)    Attestation Statements:   Reviewed by clinician on day of visit: allergies, medications, problem list, medical history, surgical history, family history, social history, and previous encounter notes.  Time spent on visit including pre-visit chart review and post-visit care was 14 minutes.   I, Doreene Nest, am acting as transcriptionist for Eber Jones, MD.  I have reviewed the above documentation for accuracy and completeness, and I agree with the above. - Ilene Qua, MD

## 2019-09-04 ENCOUNTER — Telehealth (INDEPENDENT_AMBULATORY_CARE_PROVIDER_SITE_OTHER): Payer: Self-pay

## 2019-09-04 ENCOUNTER — Other Ambulatory Visit (INDEPENDENT_AMBULATORY_CARE_PROVIDER_SITE_OTHER): Payer: Self-pay | Admitting: Family Medicine

## 2019-09-04 DIAGNOSIS — E559 Vitamin D deficiency, unspecified: Secondary | ICD-10-CM

## 2019-09-04 NOTE — Telephone Encounter (Signed)
I reached out to pt to inquire about a vitamin D refill. She informed me that she was having a reaction to something with a red, raised, itchy rash that started today. She stated that she took her 4th vitamin D pill last night. Pt was instructed to contact her PCP. I followed up with the pt later and she stated that she had been to a meeting and had put Calamine lotion on the areas. She also noted that her temperature was 99.0, but she felt ok. Pt was instructed again to contact her PCP and she stated that she would call him. Moshe Salisbury, CMA

## 2019-09-06 DIAGNOSIS — L309 Dermatitis, unspecified: Secondary | ICD-10-CM | POA: Diagnosis not present

## 2019-09-06 DIAGNOSIS — E559 Vitamin D deficiency, unspecified: Secondary | ICD-10-CM | POA: Diagnosis not present

## 2019-09-10 ENCOUNTER — Encounter (INDEPENDENT_AMBULATORY_CARE_PROVIDER_SITE_OTHER): Payer: Self-pay | Admitting: Family Medicine

## 2019-09-10 ENCOUNTER — Other Ambulatory Visit: Payer: Self-pay

## 2019-09-10 ENCOUNTER — Ambulatory Visit (INDEPENDENT_AMBULATORY_CARE_PROVIDER_SITE_OTHER): Payer: Medicare HMO | Admitting: Family Medicine

## 2019-09-10 VITALS — BP 119/71 | HR 65 | Temp 98.0°F | Ht 68.0 in | Wt 281.0 lb

## 2019-09-10 DIAGNOSIS — E7849 Other hyperlipidemia: Secondary | ICD-10-CM

## 2019-09-10 DIAGNOSIS — E559 Vitamin D deficiency, unspecified: Secondary | ICD-10-CM

## 2019-09-10 DIAGNOSIS — Z6841 Body Mass Index (BMI) 40.0 and over, adult: Secondary | ICD-10-CM | POA: Diagnosis not present

## 2019-09-10 NOTE — Progress Notes (Signed)
Chief Complaint:   OBESITY Robin Christensen is here to discuss her progress with her obesity treatment plan along with follow-up of her obesity related diagnoses. Robin Christensen is on the Category 2 Plan with 6 to 8 ounces of protien and states she is following her eating plan approximately 98% of the time. Robin Christensen states she is exercising 0 minutes 0 times per week.  Today's visit was #: 4 Starting weight: 296 lbs Starting date: 07/25/2019 Today's weight: 281 lbs Today's date: 09/10/2019 Total lbs lost to date: 15 Total lbs lost since last in-office visit: 2  Interim History: Robin Christensen voices that she had a reaction to medication and she is not sure which one medicine may have caused the reaction. She did eat out one time for breakfast in the past two weeks. She is getting all of the protein in at dinner. She has no hunger between meals. For snacks, she is doing two apples a day, and a 100 calorie bag of popcorn. She would like to incorporate swiss miss reduced calorie hot cocoa.  Subjective:   Vitamin D deficiency Robin Christensen's Vitamin D level was 23.5 on 07/25/19. She is currently taking vit D. She admits fatigue and denies nausea, vomiting or muscle weakness. Meyah had a possible reaction to vitamin D prescription.  Other hyperlipidemia Robin Christensen has hyperlipidemia and she is on Pravastatin. Her last LDL was 114 (07/25/19). She has been trying to improve her cholesterol levels with intensive lifestyle modification including a low saturated fat diet, exercise and weight loss. She denies myalgias.  Lab Results  Component Value Date   ALT 9 07/25/2019   AST 17 07/25/2019   ALKPHOS 128 (H) 07/25/2019   BILITOT 0.5 07/25/2019   Lab Results  Component Value Date   CHOL 218 (H) 07/25/2019   HDL 73 07/25/2019   LDLCALC 114 (H) 07/25/2019   TRIG 179 (H) 07/25/2019    Assessment/Plan:   Vitamin D deficiency Low Vitamin D level contributes to fatigue and are associated with obesity,  breast, and colon cancer. Robin Christensen will stop prescription Vitamin D and we will re-evaluate replacement at the next appointment. She will follow-up for routine testing of Vitamin D, at least 2-3 times per year to avoid over-replacement.  Other hyperlipidemia We will repeat fasting lipid panel in 2 months.  Class 3 severe obesity with serious comorbidity and body mass index (BMI) of 40.0 to 44.9 in adult, unspecified obesity type Robin Christensen) Robin Christensen is currently in the action stage of change. As such, her goal is to continue with weight loss efforts. She has agreed to the Category 2 Plan.   Exercise goals: as is  Behavioral modification strategies: increasing lean protein intake, increasing vegetables, meal planning and cooking strategies, keeping healthy foods in the home and planning for success.  Robin Christensen has agreed to follow-up with our clinic in 2 weeks. She was informed of the importance of frequent follow-up visits to maximize her success with intensive lifestyle modifications for her multiple health conditions.   Objective:   Blood pressure 119/71, pulse 65, temperature 98 F (36.7 C), temperature source Oral, height 5\' 8"  (1.727 m), weight 281 lb (127.5 kg), SpO2 94 %. Body mass index is 42.73 kg/m.  General: Cooperative, alert, well developed, in no acute distress. HEENT: Conjunctivae and lids unremarkable. Cardiovascular: Regular rhythm.  Lungs: Normal work of breathing. Neurologic: No focal deficits.   Lab Results  Component Value Date   CREATININE 0.78 07/25/2019   BUN 16 07/25/2019   NA 140  07/25/2019   K 4.3 07/25/2019   CL 103 07/25/2019   CO2 21 07/25/2019   Lab Results  Component Value Date   ALT 9 07/25/2019   AST 17 07/25/2019   ALKPHOS 128 (H) 07/25/2019   BILITOT 0.5 07/25/2019   Lab Results  Component Value Date   HGBA1C 5.4 07/25/2019   Lab Results  Component Value Date   INSULIN 10.3 07/25/2019   Lab Results  Component Value Date   TSH 2.010  07/25/2019   Lab Results  Component Value Date   CHOL 218 (H) 07/25/2019   HDL 73 07/25/2019   LDLCALC 114 (H) 07/25/2019   TRIG 179 (H) 07/25/2019   Lab Results  Component Value Date   WBC 6.4 07/25/2019   HGB 14.9 07/25/2019   HCT 46.7 (H) 07/25/2019   MCV 89 07/25/2019   PLT 257 07/25/2019   Lab Results  Component Value Date   IRON 97 07/25/2019   TIBC 374 07/25/2019   FERRITIN 91 07/25/2019    Ref. Range 07/25/2019 11:01  Vitamin D, 25-Hydroxy Latest Ref Range: 30.0 - 100.0 ng/mL 23.5 (L)    Obesity Behavioral Intervention Documentation for Insurance:   Approximately 15 minutes were spent on the discussion below.  ASK: We discussed the diagnosis of obesity with Robin Christensen today and Robin Christensen agreed to give [name] permission to discuss obesity behavioral modification therapy today.  ASSESS: Robin Christensen has the diagnosis of obesity and her BMI today is 42.74. Robin Christensen is in the action stage of change.   ADVISE: Robin Christensen was educated on the multiple health risks of obesity as well as the benefit of weight loss to improve her health. She was advised of the need for long term treatment and the importance of lifestyle modifications to improve her current health and to decrease her risk of future health problems.  AGREE: Multiple dietary modification options and treatment options were discussed and Robin Christensen agreed to follow the recommendations documented in the above note.  ARRANGE: Robin Christensen was educated on the importance of frequent visits to treat obesity as outlined per CMS and USPSTF guidelines and agreed to schedule her next follow up appointment today.  Attestation Statements:   Reviewed by clinician on day of visit: allergies, medications, problem list, medical history, surgical history, family history, social history, and previous encounter notes.  I, Doreene Nest, am acting as transcriptionist for Coralie Common, MD.  I have reviewed the above documentation for  accuracy and completeness, and I agree with the above. - Ilene Qua, MD

## 2019-09-26 ENCOUNTER — Ambulatory Visit (INDEPENDENT_AMBULATORY_CARE_PROVIDER_SITE_OTHER): Payer: Medicare HMO | Admitting: Family Medicine

## 2019-09-26 ENCOUNTER — Encounter (INDEPENDENT_AMBULATORY_CARE_PROVIDER_SITE_OTHER): Payer: Self-pay | Admitting: Family Medicine

## 2019-09-26 ENCOUNTER — Other Ambulatory Visit: Payer: Self-pay

## 2019-09-26 VITALS — BP 118/71 | HR 69 | Temp 98.1°F | Ht 68.0 in | Wt 277.0 lb

## 2019-09-26 DIAGNOSIS — I10 Essential (primary) hypertension: Secondary | ICD-10-CM

## 2019-09-26 DIAGNOSIS — E038 Other specified hypothyroidism: Secondary | ICD-10-CM | POA: Diagnosis not present

## 2019-09-26 DIAGNOSIS — Z6841 Body Mass Index (BMI) 40.0 and over, adult: Secondary | ICD-10-CM | POA: Diagnosis not present

## 2019-09-26 NOTE — Progress Notes (Signed)
Chief Complaint:   OBESITY Dalay is here to discuss her progress with her obesity treatment plan along with follow-up of her obesity related diagnoses. Cheryll is on the Category 2 Plan and states she is following her eating plan approximately 85% of the time. Neida states she is doing 0 minutes 0 times per week.  Today's visit was #: 5 Starting weight: 296 lbs Starting date: 07/25/2019 Today's weight: 277 lbs Today's date: 09/26/2019 Total lbs lost to date: 19 Total lbs lost since last in-office visit: 4  Interim History: Tasiana had a birthday at the end of last month. Her sons came over and grilled steaks and celebrated with [place] food yesterday. She realizes she wants and needs to start exercising. She is doing more than 7 oz of meat at night. She notes occasionally being hungry. She is doing apples for snacks and 100 calorie popcorn.  Subjective:   1. Essential hypertension Therese's blood pressure is well controlled. She denies chest pain, chest pressure, or headaches. She is not on medications.  2. Other specified hypothyroidism Shelisha denies cold/heat intolerance or palpitations. She is on levothyroxine.  Assessment/Plan:   1. Essential hypertension Mykisha is working on healthy weight loss and exercise to improve blood pressure control. We will watch for signs of hypotension as she continues her lifestyle modifications. We will recheck her blood pressure at her next appointment.  2. Other specified hypothyroidism Patient with long-standing hypothyroidism, and Maliea agreed to continue levothyroxine. She appears euthyroid. Orders and follow up as documented in patient record.  3. Class 3 severe obesity with serious comorbidity and body mass index (BMI) of 40.0 to 44.9 in adult, unspecified obesity type [facility]) Keshayla is currently in the action stage of change. As such, her goal is to continue with weight loss efforts. She has agreed to the Category 2 Plan.    If Shayma gets hungry in the next 2 weeks, will increase to 300 snack calories then will move up to the Category 3 plan.  Exercise goals: Kelsye is to plan to start walking for 10-15 minutes 3-4 times per week.  Behavioral modification strategies: increasing lean protein intake, increasing vegetables, meal planning and cooking strategies, keeping healthy foods in the home and planning for success.  Mariadelaluz has agreed to follow-up with our clinic in 2 weeks. She was informed of the importance of frequent follow-up visits to maximize her success with intensive lifestyle modifications for her multiple health conditions.   Objective:   Blood pressure 118/71, pulse 69, temperature 98.1 F (36.7 C), temperature source Oral, height 5\' 8"  (1.727 m), weight 277 lb (125.6 kg), SpO2 100 %. Body mass index is 42.12 kg/m.  General: Cooperative, alert, well developed, in no acute distress. HEENT: Conjunctivae and lids unremarkable. Cardiovascular: Regular rhythm.  Lungs: Normal work of breathing. Neurologic: No focal deficits.   Lab Results  Component Value Date   CREATININE 0.78 07/25/2019   BUN 16 07/25/2019   NA 140 07/25/2019   K 4.3 07/25/2019   CL 103 07/25/2019   CO2 21 07/25/2019   Lab Results  Component Value Date   ALT 9 07/25/2019   AST 17 07/25/2019   ALKPHOS 128 (H) 07/25/2019   BILITOT 0.5 07/25/2019   Lab Results  Component Value Date   HGBA1C 5.4 07/25/2019   Lab Results  Component Value Date   INSULIN 10.3 07/25/2019   Lab Results  Component Value Date   TSH 2.010 07/25/2019   Lab Results  Component Value  Date   CHOL 218 (H) 07/25/2019   HDL 73 07/25/2019   LDLCALC 114 (H) 07/25/2019   TRIG 179 (H) 07/25/2019   Lab Results  Component Value Date   WBC 6.4 07/25/2019   HGB 14.9 07/25/2019   HCT 46.7 (H) 07/25/2019   MCV 89 07/25/2019   PLT 257 07/25/2019   Lab Results  Component Value Date   IRON 97 07/25/2019   TIBC 374 07/25/2019    FERRITIN 91 07/25/2019   Attestation Statements:   Reviewed by clinician on day of visit: allergies, medications, problem list, medical history, surgical history, family history, social history, and previous encounter notes.  Time spent on visit including pre-visit chart review and post-visit care and charting was 13 minutes.   I, Trixie Dredge, am acting as transcriptionist for Ilene Qua, MD.  I have reviewed the above documentation for accuracy and completeness, and I agree with the above. - Ilene Qua, MD

## 2019-09-30 DIAGNOSIS — M25562 Pain in left knee: Secondary | ICD-10-CM | POA: Diagnosis not present

## 2019-09-30 DIAGNOSIS — Z96653 Presence of artificial knee joint, bilateral: Secondary | ICD-10-CM | POA: Diagnosis not present

## 2019-09-30 DIAGNOSIS — M25561 Pain in right knee: Secondary | ICD-10-CM | POA: Diagnosis not present

## 2019-10-10 ENCOUNTER — Other Ambulatory Visit: Payer: Self-pay

## 2019-10-10 ENCOUNTER — Ambulatory Visit (INDEPENDENT_AMBULATORY_CARE_PROVIDER_SITE_OTHER): Payer: Medicare HMO | Admitting: Family Medicine

## 2019-10-10 ENCOUNTER — Encounter (INDEPENDENT_AMBULATORY_CARE_PROVIDER_SITE_OTHER): Payer: Self-pay | Admitting: Family Medicine

## 2019-10-10 VITALS — BP 111/62 | HR 57 | Temp 98.1°F | Ht 68.0 in | Wt 275.0 lb

## 2019-10-10 DIAGNOSIS — E559 Vitamin D deficiency, unspecified: Secondary | ICD-10-CM

## 2019-10-10 DIAGNOSIS — E038 Other specified hypothyroidism: Secondary | ICD-10-CM

## 2019-10-10 DIAGNOSIS — Z6841 Body Mass Index (BMI) 40.0 and over, adult: Secondary | ICD-10-CM
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
## 2019-10-24 ENCOUNTER — Other Ambulatory Visit: Payer: Self-pay

## 2019-10-24 ENCOUNTER — Ambulatory Visit (INDEPENDENT_AMBULATORY_CARE_PROVIDER_SITE_OTHER): Payer: Medicare HMO | Admitting: Family Medicine

## 2019-10-24 ENCOUNTER — Encounter (INDEPENDENT_AMBULATORY_CARE_PROVIDER_SITE_OTHER): Payer: Self-pay | Admitting: Family Medicine

## 2019-10-24 VITALS — BP 111/72 | HR 69 | Temp 98.0°F | Ht 68.0 in | Wt 272.0 lb

## 2019-10-24 DIAGNOSIS — E559 Vitamin D deficiency, unspecified: Secondary | ICD-10-CM

## 2019-10-24 DIAGNOSIS — Z6841 Body Mass Index (BMI) 40.0 and over, adult: Secondary | ICD-10-CM

## 2019-10-24 DIAGNOSIS — E038 Other specified hypothyroidism: Secondary | ICD-10-CM

## 2019-10-24 NOTE — Progress Notes (Signed)
Chief Complaint:   OBESITY Robin Christensen is here to discuss her progress with her obesity treatment plan along with follow-up of her obesity related diagnoses. Robin Christensen is on the Category 3 Plan and states she is following her eating plan approximately 97% of the time. Robin Christensen states she is walking for 20 minutes 3 times per week.  Today's visit was #: 7 Starting weight: 296 lbs Starting date: 07/25/2019 Today's weight: 272 lbs Today's date: 10/24/2019 Total lbs lost to date: 24 lbs Total lbs lost since last in-office visit: 3 lbs  Interim History: Robin Christensen went to a baby shower this past weekend and indulged in a cupcake.  Otherwise, she stayed on plan the rest of the time.  She is hoping to get her car back in the next week.  She was concerned about eating Easter candy, but did not indulge.  She says she is using apples and popcorn for snack calories.  Subjective:   1. Vitamin D deficiency Robin Christensen's Vitamin D level was 23.5 on 07/25/2019. She is currently taking prescription vitamin D 50,000 IU each week. She denies nausea, vomiting or muscle weakness.  She endorses fatigue.  2. Other specified hypothyroidism She is taking levothyroxine 75 mcg daily.  No heat/cold intolerance or palpitations.   Lab Results  Component Value Date   TSH 2.010 07/25/2019   Assessment/Plan:   1. Vitamin D deficiency Low Vitamin D level contributes to fatigue and are associated with obesity, breast, and colon cancer. She agrees to continue to take prescription Vitamin D @50 ,000 IU every week and will follow-up for routine testing of Vitamin D, at least 2-3 times per year to avoid over-replacement.  2. Other specified hypothyroidism Patient with long-standing hypothyroidism, on levothyroxine therapy. She appears euthyroid. Orders and follow up as documented in patient record.  Counseling . Good thyroid control is important for overall health. Supratherapeutic thyroid levels are dangerous and will not  improve weight loss results. . The correct way to take levothyroxine is fasting, with water, separated by at least 30 minutes from breakfast, and separated by more than 4 hours from calcium, iron, multivitamins, acid reflux medications (PPIs).   3. Class 3 severe obesity with serious comorbidity and body mass index (BMI) of 40.0 to 44.9 in adult, unspecified obesity type [facility]) Robin Christensen is currently in the action stage of change. As such, her goal is to continue with weight loss efforts. She has agreed to the Category 3 Plan.   Exercise goals: As is.  Behavioral modification strategies: increasing lean protein intake, increasing vegetables, meal planning and cooking strategies, keeping healthy foods in the home and planning for success.  Robin Christensen has agreed to follow-up with our clinic in 2 weeks. She was informed of the importance of frequent follow-up visits to maximize her success with intensive lifestyle modifications for her multiple health conditions.   Objective:   Blood pressure 111/72, pulse 69, temperature 98 F (36.7 C), temperature source Oral, height 5\' 8"  (1.727 m), weight 272 lb (123.4 kg), SpO2 97 %. Body mass index is 41.36 kg/m.  General: Cooperative, alert, well developed, in no acute distress. HEENT: Conjunctivae and lids unremarkable. Cardiovascular: Regular rhythm.  Lungs: Normal work of breathing. Neurologic: No focal deficits.   Lab Results  Component Value Date   CREATININE 0.78 07/25/2019   BUN 16 07/25/2019   NA 140 07/25/2019   K 4.3 07/25/2019   CL 103 07/25/2019   CO2 21 07/25/2019   Lab Results  Component Value Date  ALT 9 07/25/2019   AST 17 07/25/2019   ALKPHOS 128 (H) 07/25/2019   BILITOT 0.5 07/25/2019   Lab Results  Component Value Date   HGBA1C 5.4 07/25/2019   Lab Results  Component Value Date   INSULIN 10.3 07/25/2019   Lab Results  Component Value Date   TSH 2.010 07/25/2019   Lab Results  Component Value Date   CHOL 218  (H) 07/25/2019   HDL 73 07/25/2019   LDLCALC 114 (H) 07/25/2019   TRIG 179 (H) 07/25/2019   Lab Results  Component Value Date   WBC 6.4 07/25/2019   HGB 14.9 07/25/2019   HCT 46.7 (H) 07/25/2019   MCV 89 07/25/2019   PLT 257 07/25/2019   Lab Results  Component Value Date   IRON 97 07/25/2019   TIBC 374 07/25/2019   FERRITIN 91 07/25/2019   Attestation Statements:   Reviewed by clinician on day of visit: allergies, medications, problem list, medical history, surgical history, family history, social history, and previous encounter notes.  Time spent on visit including pre-visit chart review and post-visit care and charting was 15 minutes.   I, Water quality scientist, CMA, am acting as transcriptionist for Coralie Common, MD.  I have reviewed the above documentation for accuracy and completeness, and I agree with the above. - Ilene Qua, MD

## 2019-10-30 DIAGNOSIS — M5136 Other intervertebral disc degeneration, lumbar region: Secondary | ICD-10-CM | POA: Diagnosis not present

## 2019-11-14 ENCOUNTER — Ambulatory Visit (INDEPENDENT_AMBULATORY_CARE_PROVIDER_SITE_OTHER): Payer: Medicare HMO | Admitting: Family Medicine

## 2019-11-14 ENCOUNTER — Other Ambulatory Visit: Payer: Self-pay

## 2019-11-14 VITALS — BP 105/71 | HR 83 | Temp 97.9°F | Ht 68.0 in | Wt 266.0 lb

## 2019-11-14 DIAGNOSIS — E8881 Metabolic syndrome: Secondary | ICD-10-CM | POA: Diagnosis not present

## 2019-11-14 DIAGNOSIS — E038 Other specified hypothyroidism: Secondary | ICD-10-CM | POA: Diagnosis not present

## 2019-11-14 DIAGNOSIS — E559 Vitamin D deficiency, unspecified: Secondary | ICD-10-CM | POA: Diagnosis not present

## 2019-11-14 DIAGNOSIS — R748 Abnormal levels of other serum enzymes: Secondary | ICD-10-CM | POA: Diagnosis not present

## 2019-11-14 DIAGNOSIS — R350 Frequency of micturition: Secondary | ICD-10-CM | POA: Diagnosis not present

## 2019-11-14 DIAGNOSIS — Z6841 Body Mass Index (BMI) 40.0 and over, adult: Secondary | ICD-10-CM

## 2019-11-14 DIAGNOSIS — N3946 Mixed incontinence: Secondary | ICD-10-CM | POA: Diagnosis not present

## 2019-11-14 DIAGNOSIS — E7849 Other hyperlipidemia: Secondary | ICD-10-CM | POA: Diagnosis not present

## 2019-11-14 NOTE — Progress Notes (Signed)
Chief Complaint:   OBESITY Robin Christensen is here to discuss her progress with her obesity treatment plan along with follow-up of her obesity related diagnoses. Shantoria is on the Category 3 Plan and states she is following her eating plan approximately 85% of the time. Kamryn states she is walking 25-30 minutes 5 times per week.  Today's visit was #: 8 Starting weight: 296 lbs Starting date: 07/25/2019 Today's weight: 266 lbs Today's date: 11/14/2019 Total lbs lost to date: 30 Total lbs lost since last in-office visit: 6  Interim History: [name] has been just doing her normal. She has followed the plan as best as she could - does not drink milk so she wants to know about substitutions. She has plans to go out to lunch with a friend in the next few weeks. Denies hunger. She is struggling to get in 8-10 oz of meat.  Subjective:   Other specified hypothyroidism. No cold intolerance, heat intolerance, or palpitations.   Lab Results  Component Value Date   TSH 2.010 07/25/2019   Insulin resistance. [name] has a diagnosis of insulin resistance based on her elevated fasting insulin level >5. She continues to work on diet and exercise to decrease her risk of diabetes. She reports occasional carb cravings.  Lab Results  Component Value Date   INSULIN 10.3 07/25/2019   Lab Results  Component Value Date   HGBA1C 5.4 07/25/2019   Vitamin D deficiency. No nausea, vomiting, or muscle weakness. [name] endorses fatigue. Last Vitamin D 23.5 on 07/25/2019.  Other hyperlipidemia. [name] is on pravastatin.   Lab Results  Component Value Date   CHOL 218 (H) 07/25/2019   HDL 73 07/25/2019   LDLCALC 114 (H) 07/25/2019   TRIG 179 (H) 07/25/2019   Lab Results  Component Value Date   ALT 9 07/25/2019   AST 17 07/25/2019   ALKPHOS 128 (H) 07/25/2019   BILITOT 0.5 07/25/2019   The ASCVD Risk score Mikey Bussing DC Jr., et al., 2013) failed to calculate for the following reasons:    Unable to determine if patient is Non-Hispanic African American  Assessment/Plan:   Other specified hypothyroidism. Patient with long-standing hypothyroidism, on levothyroxine therapy. She appears euthyroid. Orders and follow up as documented in patient record. Keyari will continue levothyroxine as directed. TSH will be drawn today.  Counseling . Good thyroid control is important for overall health. Supratherapeutic thyroid levels are dangerous and will not improve weight loss results. . The correct way to take levothyroxine is fasting, with water, separated by at least 30 minutes from breakfast, and separated by more than 4 hours from calcium, iron, multivitamins, acid reflux medications (PPIs).      Insulin resistance. Pink will continue to work on weight loss, exercise, and decreasing simple carbohydrates to help decrease the risk of diabetes. [name] agreed to follow-up with [name] as directed to closely monitor her progress. Hemoglobin A1c, Insulin, random labs ordered today.  Vitamin D deficiency. Low Vitamin D level contributes to fatigue and are associated with obesity, breast, and colon cancer. VITAMIN D 25 Hydroxy (Vit-D Deficiency, Fractures) level ordered today.  Other hyperlipidemia. Cardiovascular risk and specific lipid/LDL goals reviewed.  We discussed several lifestyle modifications today and [name] will continue to work on diet, exercise and weight loss efforts. Orders and follow up as documented in patient record. Comprehensive metabolic panel, Lipid Panel With LDL/HDL Ratio labs ordered today.  Counseling Intensive lifestyle modifications are the first line treatment for this issue. . Dietary changes:  Increase soluble fiber. Decrease simple carbohydrates. . Exercise changes: Moderate to vigorous-intensity aerobic activity 150 minutes per week if tolerated. . Lipid-lowering medications: see documented in medical record.     Class 3 severe obesity with serious  comorbidity and body mass index (BMI) of 40.0 to 44.9 in adult, unspecified obesity type ([place]).  [name] is currently in the action stage of change. As such, her goal is to continue with weight loss efforts. She has agreed to the Category 3 Plan.   Exercise goals: Older adults should follow the adult guidelines. When older adults cannot meet the adult guidelines, they should be as physically active as their abilities and conditions will allow.   Behavioral modification strategies: increasing lean protein intake, increasing vegetables, meal planning and cooking strategies, keeping healthy foods in the home and planning for success.  [name] has agreed to follow-up with our clinic in 2 weeks. She was informed of the importance of frequent follow-up visits to maximize her success with intensive lifestyle modifications for her multiple health conditions.   [name] was informed we would discuss her lab results at her next visit unless there is a critical issue that needs to be addressed sooner. [name] agreed to keep her next visit at the agreed upon time to discuss these results.  Objective:   Blood pressure 105/71, pulse 83, temperature 97.9 F (36.6 C), temperature source Oral, height 5\' 8"  (1.727 m), weight 266 lb (120.7 kg), SpO2 99 %. Body mass index is 40.45 kg/m.  General: Cooperative, alert, well developed, in no acute distress. HEENT: Conjunctivae and lids unremarkable. Cardiovascular: Regular rhythm.  Lungs: Normal work of breathing. Neurologic: No focal deficits.   Lab Results  Component Value Date   CREATININE 0.78 07/25/2019   BUN 16 07/25/2019   NA 140 07/25/2019   K 4.3 07/25/2019   CL 103 07/25/2019   CO2 21 07/25/2019   Lab Results  Component Value Date   ALT 9 07/25/2019   AST 17 07/25/2019   ALKPHOS 128 (H) 07/25/2019   BILITOT 0.5 07/25/2019   Lab Results  Component Value Date   HGBA1C 5.4 07/25/2019   Lab Results  Component Value Date   INSULIN 10.3  07/25/2019   Lab Results  Component Value Date   TSH 2.010 07/25/2019   Lab Results  Component Value Date   CHOL 218 (H) 07/25/2019   HDL 73 07/25/2019   LDLCALC 114 (H) 07/25/2019   TRIG 179 (H) 07/25/2019   Lab Results  Component Value Date   WBC 6.4 07/25/2019   HGB 14.9 07/25/2019   HCT 46.7 (H) 07/25/2019   MCV 89 07/25/2019   PLT 257 07/25/2019   Lab Results  Component Value Date   IRON 97 07/25/2019   TIBC 374 07/25/2019   FERRITIN 91 07/25/2019   Obesity Behavioral Intervention Documentation for Insurance:   Approximately 15 minutes were spent on the discussion below.  ASK: We discussed the diagnosis of obesity with [name] today and Marinelle agreed to give [name] permission to discuss obesity behavioral modification therapy today.  ASSESS: Manuela has the diagnosis of obesity and her BMI today is 40.6. Mishaela is in the action stage of change.   ADVISE: Amarya was educated on the multiple health risks of obesity as well as the benefit of weight loss to improve her health. She was advised of the need for long term treatment and the importance of lifestyle modifications to improve her current health and to decrease her risk of future health problems.  AGREE: Multiple dietary modification options and treatment options were discussed and Emmalise agreed to follow the recommendations documented in the above note.  ARRANGE: Kaylina was educated on the importance of frequent visits to treat obesity as outlined per CMS and USPSTF guidelines and agreed to schedule her next follow up appointment today.  Attestation Statements:   Reviewed by clinician on day of visit: allergies, medications, problem list, medical history, surgical history, family history, social history, and previous encounter notes.  I, Michaelene Song, am acting as transcriptionist for Coralie Common, MD   I have reviewed the above documentation for accuracy and completeness, and I agree with the  above. - Jinny Blossom, MD

## 2019-11-15 DIAGNOSIS — H04123 Dry eye syndrome of bilateral lacrimal glands: Secondary | ICD-10-CM | POA: Diagnosis not present

## 2019-11-15 LAB — HEMOGLOBIN A1C
Est. average glucose Bld gHb Est-mCnc: 114 mg/dL
Hgb A1c MFr Bld: 5.6 % (ref 4.8–5.6)

## 2019-11-15 LAB — VITAMIN D 25 HYDROXY (VIT D DEFICIENCY, FRACTURES): Vit D, 25-Hydroxy: 42.1 ng/mL (ref 30.0–100.0)

## 2019-11-15 LAB — LIPID PANEL WITH LDL/HDL RATIO
Cholesterol, Total: 164 mg/dL (ref 100–199)
HDL: 63 mg/dL (ref >39)
LDL Chol Calc (NIH): 87 mg/dL (ref 0–99)
LDL/HDL Ratio: 1.4 ratio (ref 0.0–3.2)
Triglycerides: 75 mg/dL (ref 0–149)
VLDL Cholesterol Cal: 14 mg/dL (ref 5–40)

## 2019-11-15 LAB — COMPREHENSIVE METABOLIC PANEL
ALT: 18 IU/L (ref 0–32)
AST: 20 IU/L (ref 0–40)
Albumin/Globulin Ratio: 1.9 (ref 1.2–2.2)
Albumin: 4.2 g/dL (ref 3.8–4.8)
Alkaline Phosphatase: 132 IU/L — ABNORMAL HIGH (ref 39–117)
BUN/Creatinine Ratio: 16 (ref 12–28)
BUN: 15 mg/dL (ref 8–27)
Bilirubin Total: 0.5 mg/dL (ref 0.0–1.2)
CO2: 24 mmol/L (ref 20–29)
Calcium: 9.4 mg/dL (ref 8.7–10.3)
Chloride: 106 mmol/L (ref 96–106)
Creatinine, Ser: 0.95 mg/dL (ref 0.57–1.00)
GFR calc Af Amer: 70 mL/min/{1.73_m2} (ref >59)
GFR calc non Af Amer: 61 mL/min/{1.73_m2} (ref >59)
Globulin, Total: 2.2 g/dL (ref 1.5–4.5)
Glucose: 94 mg/dL (ref 65–99)
Potassium: 4.3 mmol/L (ref 3.5–5.2)
Sodium: 142 mmol/L (ref 134–144)
Total Protein: 6.4 g/dL (ref 6.0–8.5)

## 2019-11-15 LAB — TSH: TSH: 1.81 u[IU]/mL (ref 0.450–4.500)

## 2019-11-15 LAB — INSULIN, RANDOM: INSULIN: 6.5 u[IU]/mL (ref 2.6–24.9)

## 2019-12-03 ENCOUNTER — Encounter (INDEPENDENT_AMBULATORY_CARE_PROVIDER_SITE_OTHER): Payer: Self-pay | Admitting: Family Medicine

## 2019-12-03 ENCOUNTER — Other Ambulatory Visit: Payer: Self-pay

## 2019-12-03 ENCOUNTER — Ambulatory Visit (INDEPENDENT_AMBULATORY_CARE_PROVIDER_SITE_OTHER): Payer: Medicare HMO | Admitting: Family Medicine

## 2019-12-03 VITALS — BP 105/69 | HR 60 | Temp 97.9°F | Ht 68.0 in | Wt 263.0 lb

## 2019-12-03 DIAGNOSIS — E559 Vitamin D deficiency, unspecified: Secondary | ICD-10-CM

## 2019-12-03 DIAGNOSIS — K5909 Other constipation: Secondary | ICD-10-CM

## 2019-12-03 DIAGNOSIS — Z6841 Body Mass Index (BMI) 40.0 and over, adult: Secondary | ICD-10-CM | POA: Diagnosis not present

## 2019-12-03 NOTE — Progress Notes (Signed)
Chief Complaint:   OBESITY Robin Christensen is here to discuss her progress with her obesity treatment plan along with follow-up of her obesity related diagnoses. Robin Christensen is on the Category 3 Plan and states she is following her eating plan approximately 90% of the time. Robin Christensen states she is walking 30 minutes 3 times per week.  Today's visit was #: 9 Starting weight: 296 lbs Starting date: 07/25/2019 Today's weight: 263 lbs Today's date: 12/03/2019 Total lbs lost to date: 33 Total lbs lost since last in-office visit: 3  Interim History: Robin Christensen has been following the plan as strict as possible. She reports eating 6-10 oz of protein at dinner (averaging 7-7.5 oz). She is eating yogurt as a substitute for milk and reports eating an apple or popcorn for a snack. She is replacing the floor on the first floor of her house in the next few weeks.  Subjective:   Other constipation. Robin Christensen reports she has not had a bowel movement in 3 days and is not taking anything. Previously she was having a bowel movement every other day.  Vitamin D deficiency. Last Vitamin D 42.1 on 11/14/2019 (previously 23.5). No nausea, vomiting, or muscle weakness. She does endorse fatigue.  Assessment/Plan:   Other constipation. Robin Christensen was informed that a decrease in bowel movement frequency is normal while losing weight, but stools should not be hard or painful. Orders and follow up as documented in patient record. She will start polyethylene glycol powder (GLYCOLAX/MIRALAX) 17 GM/SCOOP powder PO daily #30 with 0 refills.  Counseling Getting to Good Bowel Health: Your goal is to have one soft bowel movement each day. Drink at least 8 glasses of water each day. Eat plenty of fiber (goal is over 25 grams each day). It is best to get most of your fiber from dietary sources which includes leafy green vegetables, fresh fruit, and whole grains. You may need to add fiber with the help of OTC fiber supplements.  These include Metamucil, Citrucel, and Flaxseed. If you are still having trouble, try adding Miralax or Magnesium Citrate. If all of these changes do not work, Cabin crew.   Vitamin D deficiency. Low Vitamin D level contributes to fatigue and are associated with obesity, breast, and colon cancer. She agrees to continue to take prescription Vitamin D @50 ,000 IU every week and will follow-up for routine testing of Vitamin D, at least 2-3 times per year to avoid over-replacement.  Class 3 severe obesity with serious comorbidity and body mass index (BMI) of 40.0 to 44.9 in adult, unspecified obesity type ([place]).  Robin Christensen is currently in the action stage of change. As such, her goal is to continue with weight loss efforts. She has agreed to the Category 3 Plan.   Exercise goals: Robin Christensen will increase her walking to 6 times per week.  Behavioral modification strategies: increasing lean protein intake, increasing vegetables, meal planning and cooking strategies and keeping healthy foods in the home.  Robin Christensen has agreed to follow-up with our clinic in 2 weeks. She was informed of the importance of frequent follow-up visits to maximize her success with intensive lifestyle modifications for her multiple health conditions.   Objective:   Blood pressure 105/69, pulse 60, temperature 97.9 F (36.6 C), temperature source Oral, height 5\' 8"  (1.727 m), weight 263 lb (119.3 kg), SpO2 95 %. Body mass index is 39.99 kg/m.  General: Cooperative, alert, well developed, in no acute distress. HEENT: Conjunctivae and lids unremarkable. Cardiovascular: Regular rhythm.  Lungs:  Normal work of breathing. Neurologic: No focal deficits.   Lab Results  Component Value Date   CREATININE 0.95 11/14/2019   BUN 15 11/14/2019   NA 142 11/14/2019   K 4.3 11/14/2019   CL 106 11/14/2019   CO2 24 11/14/2019   Lab Results  Component Value Date   ALT 18 11/14/2019   AST 20 11/14/2019   ALKPHOS 132 (H)  11/14/2019   BILITOT 0.5 11/14/2019   Lab Results  Component Value Date   HGBA1C 5.6 11/14/2019   HGBA1C 5.4 07/25/2019   Lab Results  Component Value Date   INSULIN 6.5 11/14/2019   INSULIN 10.3 07/25/2019   Lab Results  Component Value Date   TSH 1.810 11/14/2019   Lab Results  Component Value Date   CHOL 164 11/14/2019   HDL 63 11/14/2019   LDLCALC 87 11/14/2019   TRIG 75 11/14/2019   Lab Results  Component Value Date   WBC 6.4 07/25/2019   HGB 14.9 07/25/2019   HCT 46.7 (H) 07/25/2019   MCV 89 07/25/2019   PLT 257 07/25/2019   Lab Results  Component Value Date   IRON 97 07/25/2019   TIBC 374 07/25/2019   FERRITIN 91 07/25/2019   Attestation Statements:   Reviewed by clinician on day of visit: allergies, medications, problem list, medical history, surgical history, family history, social history, and previous encounter notes.  Time spent on visit including pre-visit chart review and post-visit charting and care was 20 minutes.   I, Michaelene Song, am acting as transcriptionist for Coralie Common, MD   I have reviewed the above documentation for accuracy and completeness, and I agree with the above. - Jinny Blossom, MD

## 2019-12-08 ENCOUNTER — Other Ambulatory Visit: Payer: Self-pay | Admitting: Neurology

## 2019-12-08 DIAGNOSIS — G44219 Episodic tension-type headache, not intractable: Secondary | ICD-10-CM

## 2019-12-11 DIAGNOSIS — G4733 Obstructive sleep apnea (adult) (pediatric): Secondary | ICD-10-CM | POA: Diagnosis not present

## 2019-12-19 ENCOUNTER — Encounter (INDEPENDENT_AMBULATORY_CARE_PROVIDER_SITE_OTHER): Payer: Self-pay | Admitting: Family Medicine

## 2019-12-19 ENCOUNTER — Ambulatory Visit (INDEPENDENT_AMBULATORY_CARE_PROVIDER_SITE_OTHER): Payer: Medicare HMO | Admitting: Family Medicine

## 2019-12-19 ENCOUNTER — Other Ambulatory Visit: Payer: Self-pay

## 2019-12-19 VITALS — BP 118/83 | HR 63 | Temp 98.0°F | Ht 68.0 in | Wt 257.0 lb

## 2019-12-19 DIAGNOSIS — Z6839 Body mass index (BMI) 39.0-39.9, adult: Secondary | ICD-10-CM | POA: Diagnosis not present

## 2019-12-19 DIAGNOSIS — E559 Vitamin D deficiency, unspecified: Secondary | ICD-10-CM | POA: Diagnosis not present

## 2019-12-19 DIAGNOSIS — E8881 Metabolic syndrome: Secondary | ICD-10-CM

## 2019-12-19 NOTE — Progress Notes (Signed)
Chief Complaint:   OBESITY Robin Christensen is here to discuss her progress with her obesity treatment plan along with follow-up of her obesity related diagnoses. Robin Christensen is on the Category 3 Plan and states she is following her eating plan approximately 95% of the time. Robin Christensen states she is walking for 30-45 minutes 5 times per week.  Today's visit was #: 10 Starting weight: 296 lbs Starting date: 07/25/2019 Today's weight: 257 lbs Today's date: 12/19/2019 Total lbs lost to date: 39 lbs Total lbs lost since last in-office visit: 6 lbs  Interim History: Robin Christensen had 1 bowl of ice cream with some nuts on it.  Otherwise, she stuck to the meal plan almost completely.  She is doing well on Category 3.  She says she is content on the plan.  Sometimes she can get in a full 8 ounces of protein (or 10 ounces if it is beef).  Denies hunger.  She is having yogurt, apples, or popcorn for snacks.   Subjective:   1. Vitamin D deficiency Robin Christensen's Vitamin D level was 42.1 on 11/14/2019. She is currently taking OTC vitamin D. She denies nausea, vomiting or muscle weakness.  She endorses fatigue.  2. Insulin resistance Robin Christensen has a diagnosis of insulin resistance based on her elevated fasting insulin level >5. She continues to work on diet and exercise to decrease her risk of diabetes.  Occasional carb cravings for candy.  Lab Results  Component Value Date   INSULIN 6.5 11/14/2019   INSULIN 10.3 07/25/2019   Lab Results  Component Value Date   HGBA1C 5.6 11/14/2019   Assessment/Plan:   1. Vitamin D deficiency Low Vitamin D level contributes to fatigue and are associated with obesity, breast, and colon cancer. She agrees to continue to take OTC Vitamin D daily and will follow-up for routine testing of Vitamin D, at least 2-3 times per year to avoid over-replacement.  2. Insulin resistance Robin Christensen will continue to work on weight loss, exercise, and decreasing simple carbohydrates to help  decrease the risk of diabetes. Robin Christensen agreed to follow-up with [name] as directed to closely monitor her progress.  3. Class 2 severe obesity with serious comorbidity and body mass index (BMI) of 39.0 to 39.9 in adult, unspecified obesity type Robin Christensen is currently in the action stage of change. As such, her goal is to continue with weight loss efforts. She has agreed to the Category 3 Plan.   Exercise goals: As is.  Behavioral modification strategies: increasing lean protein intake, meal planning and cooking strategies, keeping healthy foods in the home and planning for success.  Robin Christensen has agreed to follow-up with our clinic in 3 weeks. She was informed of the importance of frequent follow-up visits to maximize her success with intensive lifestyle modifications for her multiple health conditions.   Objective:   Blood pressure 118/83, pulse 63, temperature 98 F (36.7 C), temperature source Oral, height 5\' 8"  (1.727 m), weight 257 lb (116.6 kg), SpO2 97 %. Body mass index is 39.08 kg/m.  General: Cooperative, alert, well developed, in no acute distress. HEENT: Conjunctivae and lids unremarkable. Cardiovascular: Regular rhythm.  Lungs: Normal work of breathing. Neurologic: No focal deficits.   Lab Results  Component Value Date   CREATININE 0.95 11/14/2019   BUN 15 11/14/2019   NA 142 11/14/2019   K 4.3 11/14/2019   CL 106 11/14/2019   CO2 24 11/14/2019   Lab Results  Component Value Date   ALT 18 11/14/2019   AST  20 11/14/2019   ALKPHOS 132 (H) 11/14/2019   BILITOT 0.5 11/14/2019   Lab Results  Component Value Date   HGBA1C 5.6 11/14/2019   HGBA1C 5.4 07/25/2019   Lab Results  Component Value Date   INSULIN 6.5 11/14/2019   INSULIN 10.3 07/25/2019   Lab Results  Component Value Date   TSH 1.810 11/14/2019   Lab Results  Component Value Date   CHOL 164 11/14/2019   HDL 63 11/14/2019   LDLCALC 87 11/14/2019   TRIG 75 11/14/2019   Lab Results    Component Value Date   WBC 6.4 07/25/2019   HGB 14.9 07/25/2019   HCT 46.7 (H) 07/25/2019   MCV 89 07/25/2019   PLT 257 07/25/2019   Lab Results  Component Value Date   IRON 97 07/25/2019   TIBC 374 07/25/2019   FERRITIN 91 07/25/2019   Obesity Behavioral Intervention Documentation for Insurance:   Approximately 15 minutes were spent on the discussion below.  ASK: We discussed the diagnosis of obesity with Robin Christensen today and Robin Christensen agreed to give [name] permission to discuss obesity behavioral modification therapy today.  ASSESS: Robin Christensen has the diagnosis of obesity and her BMI today is 39.2. Robin Christensen is in the action stage of change.   ADVISE: Robin Christensen was educated on the multiple health risks of obesity as well as the benefit of weight loss to improve her health. She was advised of the need for long term treatment and the importance of lifestyle modifications to improve her current health and to decrease her risk of future health problems.  AGREE: Multiple dietary modification options and treatment options were discussed and Robin Christensen agreed to follow the recommendations documented in the above note.  ARRANGE: Robin Christensen was educated on the importance of frequent visits to treat obesity as outlined per CMS and USPSTF guidelines and agreed to schedule her next follow up appointment today.  Attestation Statements:   Reviewed by clinician on day of visit: allergies, medications, problem list, medical history, surgical history, family history, social history, and previous encounter notes.  Time spent on visit including pre-visit chart review and post-visit care and charting was 15 minutes.   I, Water quality scientist, CMA, am acting as transcriptionist for Coralie Common, MD.  I have reviewed the above documentation for accuracy and completeness, and I agree with the above. - Jinny Blossom, MD

## 2020-01-07 ENCOUNTER — Other Ambulatory Visit: Payer: Self-pay | Admitting: Neurology

## 2020-01-07 DIAGNOSIS — G44219 Episodic tension-type headache, not intractable: Secondary | ICD-10-CM

## 2020-01-14 ENCOUNTER — Other Ambulatory Visit: Payer: Self-pay

## 2020-01-14 ENCOUNTER — Encounter (INDEPENDENT_AMBULATORY_CARE_PROVIDER_SITE_OTHER): Payer: Self-pay | Admitting: Adult Health

## 2020-01-14 ENCOUNTER — Ambulatory Visit (INDEPENDENT_AMBULATORY_CARE_PROVIDER_SITE_OTHER): Payer: Medicare HMO | Admitting: Adult Health

## 2020-01-14 VITALS — BP 114/75 | HR 57 | Temp 98.5°F | Ht 68.0 in | Wt 256.0 lb

## 2020-01-14 DIAGNOSIS — E669 Obesity, unspecified: Secondary | ICD-10-CM | POA: Insufficient documentation

## 2020-01-14 DIAGNOSIS — Z6838 Body mass index (BMI) 38.0-38.9, adult: Secondary | ICD-10-CM

## 2020-01-14 DIAGNOSIS — F3289 Other specified depressive episodes: Secondary | ICD-10-CM

## 2020-01-14 DIAGNOSIS — R600 Localized edema: Secondary | ICD-10-CM | POA: Insufficient documentation

## 2020-01-14 DIAGNOSIS — E7849 Other hyperlipidemia: Secondary | ICD-10-CM | POA: Diagnosis not present

## 2020-01-14 DIAGNOSIS — E782 Mixed hyperlipidemia: Secondary | ICD-10-CM | POA: Insufficient documentation

## 2020-01-14 DIAGNOSIS — F32A Depression, unspecified: Secondary | ICD-10-CM | POA: Insufficient documentation

## 2020-01-14 MED ORDER — BUPROPION HCL ER (SR) 150 MG PO TB12: 150.0000 mg | ORAL_TABLET | Freq: Every day | ORAL | 0 refills | Status: DC

## 2020-01-14 NOTE — Progress Notes (Signed)
Chief Complaint:   OBESITY Robin Christensen is here to discuss her progress with her obesity treatment plan along with follow-up of her obesity related diagnoses. Robin Christensen is on the Category 3 Plan and states she is following her eating plan approximately 95% of the time. Robin Christensen states she is walking 2.5 miles 6 times per week.  Today's visit was #: 11 Starting weight: 296 lbs Starting date: 07/25/2019 Today's weight: 256 lbs Today's date: 01/14/2020 Total lbs lost to date: 40 Total lbs lost since last in-office visit: 1  Interim History: Robin Christensen states "I feel better, much better.", since starting the program. She reports an increase in midmorning cravings for candy and sweets that has been present for months. 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  She reports these two events interfered with her regular exercise program and ultimately caused her to gain weight.  Her ultimate goal is to be able to walk ~3 miles a day and swim 3 times a week, which was her normal activity late 2019/early 2020.  Subjective:   Other hyperlipidemia. Robin Christensen is on pravastatin 40 mg daily. She denies myalgias. She denies tobacco/vape use.  Lab Results  Component Value Date   CHOL 164 11/14/2019   HDL 63 11/14/2019   LDLCALC 87 11/14/2019   TRIG 75 11/14/2019   Lab Results  Component Value Date   ALT 18 11/14/2019   AST 20 11/14/2019   ALKPHOS 132 (H) 11/14/2019   BILITOT 0.5 11/14/2019   The ASCVD Risk score Robin Bussing DC Jr., Robin al., Robin Christensen) failed to calculate for the following reasons:   Unable to determine if patient is Non-Hispanic African American  Other depression, with emotional eating. Robin Christensen is struggling with emotional eating and using food for comfort to the extent that it is negatively impacting her health. She has been working on behavior modification techniques to help reduce her emotional eating and has been somewhat successful. She shows  no sign of suicidal or homicidal ideations. Robin Christensen reports increased cravings for sugar/candy, especially midmorning. She denies history of seizures. Blood pressure is excellent today. She is not on daily anti-hypertensive medication.  Lower extremity edema. Robin Christensen estimates to use furosemide once monthly for lower extremity edema. She denies diuretic use for blood pressure control.  Assessment/Plan:   Other hyperlipidemia. Cardiovascular risk and specific lipid/LDL goals reviewed.  We discussed several lifestyle modifications today and Robin Christensen will continue to work on diet, exercise and weight loss efforts. Orders and follow up as documented in patient record. She will continue current statin therapy and will continue following the Category 3 meal plan.  Counseling Intensive lifestyle modifications are the first line treatment for this issue. . Dietary changes: Increase soluble fiber. Decrease simple carbohydrates. . Exercise changes: Moderate to vigorous-intensity aerobic activity 150 minutes per week if tolerated. . Lipid-lowering medications: see documented in medical record.  Other depression, with emotional eating. Behavior modification techniques were discussed today to help Robin Christensen deal with her emotional/non-hunger eating behaviors.  Orders and follow up as documented in patient record. Prescription was given for buPROPion (WELLBUTRIN SR) 150 MG 12 hr tablet daily #30 with 0 refills. She will continue to follow the Category 3 meal plan.  Lower extremity edema. Robin Christensen will continue furosemide as directed and will remain well hydrated.  Class 2 severe obesity with serious comorbidity and body mass index (BMI) of 38.0 to 38.9 in adult, unspecified obesity type ([place]).  Robin Christensen is currently in the  action stage of change. As such, her goal is to continue with weight loss efforts. She has agreed to the Category 3 Plan.   Exercise goals: Robin Christensen will continue her current exercise  regimen.   Handout was provided on Additional Breakfast Options.  Behavioral modification strategies: increasing lean protein intake, meal planning and cooking strategies, better snacking choices, emotional eating strategies and planning for success.  Robin Christensen has agreed to follow-up with our clinic in 2 weeks. She was informed of the importance of frequent follow-up visits to maximize her success with intensive lifestyle modifications for her multiple health conditions.   Objective:   Blood pressure 114/75, pulse (!) 57, temperature 98.5 F (36.9 C), temperature source Oral, height 5\' 8"  (1.727 m), weight 256 lb (116.1 kg), SpO2 98 %. Body mass index is 38.92 kg/m.  General: Cooperative, alert, well developed, in no acute distress. HEENT: Conjunctivae and lids unremarkable. Cardiovascular: Regular rhythm.  Lungs: Normal work of breathing. Neurologic: No focal deficits.   Lab Results  Component Value Date   CREATININE 0.95 11/14/2019   BUN 15 11/14/2019   NA 142 11/14/2019   K 4.3 11/14/2019   CL 106 11/14/2019   CO2 24 11/14/2019   Lab Results  Component Value Date   ALT 18 11/14/2019   AST 20 11/14/2019   ALKPHOS 132 (H) 11/14/2019   BILITOT 0.5 11/14/2019   Lab Results  Component Value Date   HGBA1C 5.6 11/14/2019   HGBA1C 5.4 07/25/2019   Lab Results  Component Value Date   INSULIN 6.5 11/14/2019   INSULIN 10.3 07/25/2019   Lab Results  Component Value Date   TSH 1.810 11/14/2019   Lab Results  Component Value Date   CHOL 164 11/14/2019   HDL 63 11/14/2019   LDLCALC 87 11/14/2019   TRIG 75 11/14/2019   Lab Results  Component Value Date   WBC 6.4 07/25/2019   HGB 14.9 07/25/2019   HCT 46.7 (H) 07/25/2019   MCV 89 07/25/2019   PLT 257 07/25/2019   Lab Results  Component Value Date   IRON 97 07/25/2019   TIBC 374 07/25/2019   FERRITIN 91 07/25/2019   Obesity Behavioral Intervention Documentation for Insurance:   Approximately 15 minutes were  spent on the discussion below.  ASK: We discussed the diagnosis of obesity with [name] today and [name] agreed to give [name] permission to discuss obesity behavioral modification therapy today.  ASSESS: Satara has the diagnosis of obesity and her BMI today is 38.9. Marquita is in the action stage of change.   ADVISE: Charina was educated on the multiple health risks of obesity as well as the benefit of weight loss to improve her health. She was advised of the need for long term treatment and the importance of lifestyle modifications to improve her current health and to decrease her risk of future health problems.  AGREE: Multiple dietary modification options and treatment options were discussed and Tessia agreed to follow the recommendations documented in the above note.  ARRANGE: Vastie was educated on the importance of frequent visits to treat obesity as outlined per CMS and USPSTF guidelines and agreed to schedule her next follow up appointment today.  Attestation Statements:   Reviewed by clinician on day of visit: allergies, medications, problem list, medical history, surgical history, family history, social history, and previous encounter notes.  IMichaelene Song, am acting as Location manager for [employer], NP-C   I have reviewed the above documentation for accuracy and completeness, and I agree  with the above. -  Esaw Grandchild, NP

## 2020-01-23 NOTE — Progress Notes (Addendum)
NEUROLOGY FOLLOW UP OFFICE NOTE  Robin Christensen [id number]  HISTORY OF PRESENT ILLNESS: Robin Christensen is a 69 year old right-handed female with hypothyroidism, hyperlipidemia, hypertension, OSA, atrial fibrillation status post cardioversion and history of breast cancer status post mastectomy who follows up for headache.  UPDATE: Intensity:  Moderate Duration:  May last for hours Frequency:  Maybe 1 a month on average. Current NSAIDS:None Current analgesics:Tylenol Extra-sength Current triptans:None Current ergotamine:None Current anti-emetic:None Current muscle relaxants:None Current anti-anxiolytic:None Current sleep aide:None Current Antihypertensive medications:Lasix Current Antidepressant medications:none Current Anticonvulsant medications:zonisamide 50mg  Current anti-CGRP:None Current Vitamins/Herbal/Supplements:None Current Antihistamines/Decongestants:None Other therapy:None  Caffeine:No Alcohol:No Smoker:No Diet:Hydrates Exercise:Walks 3 miles 5 days a week Depression:No; Anxiety:No Other pain:Mild neck pain Sleep hygiene:Sleeps 4 to 5 hours straight, wakes up for 2 hours and then sleeps for another 2 hours. She has OSA which is successfully treated with CPAP.  She was advised to follow up with her dentist to evaluate for TMJ dysfunction.She hasn't had a chance to go to the walker.  HISTORY: Onset:  2018. At the time, they were severe and daily. It was found that her OSA was uncontrolled and CPAP was adjusted. Headaches resolved but returned about 2 months ago, albeit less severe. CPAP was rechecked and is adequate. Location: Bifrontal/across top of head Quality: pressure Initial intensity: mild. Shedenies new headache, thunderclap headache or severe headache that wakes herfrom sleep. Aura: no Prodrome: no Postdrome: no Associated symptoms:  None.Initially some nausea a year ago. Now without nausea,  vomiting, photophobia, phonophobia, autonomic symptoms or visual disturbance. She denies associated unilateral numbness or weakness. InitialDuration: 1 hour to all day InitialFrequency: 3 days a week InitialFrequency of abortive medication: Extra-strength Tylenol 3 days a week Triggers:  Emotional stress Relieving factors:  When occupied by other activities Activity: Does not aggravate She endorses aching pain in her jaw, right worse than left. 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 She hasn't seen a dentist in years. She has seen ophthalmology and had cataract surgery this year. Vision is improved.   MRI of brain without and with contrast from 01/15/18 was personally reviewed and was unremarkable except for incidental partial empty sella.  Past NSAIDS: ibuprofen, naproxen Past analgesics: no Past abortive triptans: no Past muscle relaxants: no Past anti-emetic: no Past antihypertensive medications: Toprol XL Past antidepressant medications: Nortriptyline 10mg  (hunger/weight gain, sluggishness), Cymbalta 60mg  Past anticonvulsant medications:Topiramate 25 mg at bedtime (discontinued due to cognitive deficits) Past vitamins/Herbal/Supplements: no Past antihistamines/decongestants: no Other past therapies: no  She has history of headaches off and on throughout her life.  Family history of headache: No  In August 2019, she endorsed cognitive changes. She would repeat things to other people after just 15 minutes. Another time, she briefly thought her grandchildren were in school but they were out for the summer. She did recognize the refrigerator repair man. She was started on B12 supplementation. Due to these changes, topiramate was discontinued and she was started on gabapentin. However, she is now not on gabapentin. She is currently on Cymbalta. Sleep study was okay. 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 She has a follow up appointment in a couple of weeks. Workup is ongoing.   PAST MEDICAL HISTORY: Past Medical History:  Diagnosis Date  . A-fib ([place])   . B12 deficiency   . Back pain   . Breast cancer ([place])   . Cancer [facility])    Right Breast Cancer  . Chronic  headaches   . Dysrhythmia    AF  . Edema, lower extremity   . Gallbladder disease   . GERD (gastroesophageal reflux disease)   . Hyperlipidemia   . Hypertension   . Hypothyroidism   . Joint pain   . Obesity   . Pneumonia   . PONV (postoperative nausea and vomiting)   . Sleep apnea    cpcap  . Vitamin deficiency     MEDICATIONS: Current Outpatient Medications on File Prior to Visit  Medication Sig Dispense Refill  . acetaminophen (TYLENOL) 500 MG tablet Take 500 mg by mouth 3 (three) times daily as needed for mild pain or headache.     Marland Kitchen buPROPion (WELLBUTRIN SR) 150 MG 12 hr tablet Take 1 tablet (150 mg total) by mouth daily. 30 tablet 0  . CALCIUM-VITAMIN D PO Take 2 tablets by mouth every evening.    . diphenhydrAMINE (BENADRYL) 50 MG capsule Take 50 mg by mouth every 6 (six) hours as needed.    Marland Kitchen ELIQUIS 5 MG TABS tablet TAKE 1 TABLET TWICE DAILY 180 tablet 0  . furosemide (LASIX) 40 MG tablet Take 40 mg by mouth daily.    Marland Kitchen gabapentin (NEURONTIN) 300 MG capsule Take 300 mg by mouth 3 (three) times daily.    Marland Kitchen levothyroxine (SYNTHROID, LEVOTHROID) 75 MCG tablet Take 75 mcg by mouth daily before breakfast.    . mirabegron ER (MYRBETRIQ) 50 MG TB24 tablet Take 50 mg by mouth daily.    . potassium chloride SA (K-DUR,KLOR-CON) 20 MEQ tablet Take 20 mEq by mouth daily.     . pravastatin (PRAVACHOL) 40 MG tablet Take 40 mg by mouth daily.     . vitamin B-12 (CYANOCOBALAMIN) 1000 MCG tablet Take 1,000 mcg by mouth daily.    Marland Kitchen zonisamide (ZONEGRAN) 50 MG capsule TAKE 1 CAPSULE BY MOUTH AT BEDTIME . APPOINTMENT REQUIRED FOR FUTURE REFILLS 30 capsule 0   No current facility-administered medications on  file prior to visit.    ALLERGIES: Allergies  Allergen Reactions  . Xarelto [Rivaroxaban] Other (See Comments)    JOINT ACHES/PAIN    FAMILY HISTORY: Family History  Problem Relation Age of Onset  . High blood pressure Mother   . Heart disease Mother   . Cancer Mother   . Depression Mother   . Obesity Mother   . Heart disease Father   . Kidney disease Father   . Cancer Father     SOCIAL HISTORY: Social History   Socioeconomic History  . Marital status: Divorced    Spouse name: Not on file  . Number of children: 2  . Years of education: Not on file  . Highest education level: Bachelor's degree (e.g., BA, AB, BS)  Occupational History  . Occupation: RETIRED  Tobacco Use  . Smoking status: Never Smoker  . Smokeless tobacco: Never Used  Vaping Use  . Vaping Use: Never used  Substance and Sexual Activity  . Alcohol use: Yes    Comment: once a year, maybe  . Drug use: No  . Sexual activity: Not on file  Other Topics Concern  . Not on file  Social History Narrative   Patient is right-handed. She avoids caffeine, walks 3 miles 5 days a week. She lives in a 2 story house. Prior to retirement, she was in Press photographer.   Social Determinants of Health   Financial Resource Strain:   . Difficulty of Paying Living Expenses:   Food Insecurity:   . Worried About Estate manager/land agent  of Food in the Last Year:   . [place] in the Last Year:   Transportation Needs:   . Lack of Transportation (Medical):   Marland Kitchen Lack of Transportation (Non-Medical):   Physical Activity:   . Days of Exercise per Week:   . Minutes of Exercise per Session:   Stress:   . Feeling of Stress :   Social Connections:   . Frequency of Communication with Friends and Family:   . Frequency of Social Gatherings with Friends and Family:   . Attends Religious Services:   . Active Member of Clubs or Organizations:   . Attends Archivist Meetings:   Marland Kitchen Marital Status:   Intimate Partner Violence:   .  Fear of Current or Ex-Partner:   . Emotionally Abused:   Marland Kitchen Physically Abused:   . Sexually Abused:     PHYSICAL EXAM: Blood pressure 123/72, pulse (!) 52, height 5\' 8"  (1.727 m), weight 256 lb 3.2 oz (116.2 kg), SpO2 94 %. General: No acute distress.  Patient appears well-groomed.   Head:  Normocephalic/atraumatic Eyes:  Fundi examined but not visualized Neck: supple, no paraspinal tenderness, full range of motion Heart:  Regular rate, irregular rhythm Lungs:  Clear to auscultation bilaterally Back: No paraspinal tenderness Neurological Exam: alert and oriented to person, place, and time. Attention span and concentration intact, recent and remote memory intact, fund of knowledge intact.  Speech fluent and not dysarthric, language intact.  CN II-XII intact. Bulk and tone normal, muscle strength 5/5 throughout.  Sensation to light touch, temperature and vibration intact.  Deep tendon reflexes 2+ throughout, toes downgoing.  Finger to nose and heel to shin testing intact.  Gait normal, Romberg negative.  IMPRESSION: Tension-type headache, not intractable PLAN: 1.  For preventative management, zonisamide 50mg  daily refilled 2.  For abortive therapy, Extra-strength Tylenol with coffee 3.  Limit use of pain relievers to no more than 2 days out of week to prevent risk of rebound or medication-overuse headache. 4.  Keep headache diary 5.  Exercise, hydration, caffeine cessation, sleep hygiene, monitor for and avoid triggers 6. Follow up one year   Metta Clines, DO  CC: London Pepper, MD

## 2020-01-27 ENCOUNTER — Other Ambulatory Visit: Payer: Self-pay

## 2020-01-27 ENCOUNTER — Ambulatory Visit: Payer: Medicare HMO | Admitting: Neurology

## 2020-01-27 ENCOUNTER — Encounter: Payer: Self-pay | Admitting: Neurology

## 2020-01-27 VITALS — BP 123/72 | HR 52 | Ht 68.0 in | Wt 256.2 lb

## 2020-01-27 DIAGNOSIS — G44219 Episodic tension-type headache, not intractable: Secondary | ICD-10-CM

## 2020-01-27 MED ORDER — ZONISAMIDE 50 MG PO CAPS: 50.0000 mg | ORAL_CAPSULE | Freq: Every day | ORAL | 3 refills | Status: DC

## 2020-01-27 NOTE — Patient Instructions (Signed)
  1. Zonisamide 50mg  daily 2. Take Tylenol with cup of coffee for headache. 3. Limit use of pain relievers to no more than 2 days out of the week.  These medications include acetaminophen, NSAIDs (ibuprofen/Advil/Motrin, naproxen/Aleve, triptans (Imitrex/sumatriptan), Excedrin, and narcotics.  This will help reduce risk of rebound headaches. 4. Be aware of common food triggers:  - Caffeine:  coffee, black tea, cola, Mt. Dew  - Chocolate  - Dairy:  aged cheeses (brie, blue, cheddar, gouda, [place], provolone, [place], Swiss, etc), chocolate milk, buttermilk, sour cream, limit eggs and yogurt  - Nuts, peanut butter  - Alcohol  - Cereals/grains:  FRESH breads (fresh bagels, sourdough, doughnuts), yeast productions  - Processed/canned/aged/cured meats (pre-packaged deli meats, hotdogs)  - MSG/glutamate:  soy sauce, flavor enhancer, pickled/preserved/marinated foods  - Sweeteners:  aspartame (Equal, Nutrasweet).  Sugar and Splenda are okay  - Vegetables:  legumes (lima beans, lentils, snow peas, fava beans, pinto peans, peas, garbanzo beans), sauerkraut, onions, olives, pickles  - Fruit:  avocados, bananas, citrus fruit (orange, lemon, grapefruit), mango  - Other:  Frozen meals, macaroni and cheese 5. Routine exercise 6. Stay adequately hydrated (aim for 64 oz water daily) 7. Keep headache diary 8. Maintain proper stress management 9. Maintain proper sleep hygiene 10. Do not skip meals 11. Consider supplements:  magnesium citrate 400mg  daily, riboflavin 400mg  daily, coenzyme Q10 100mg  three times daily.

## 2020-01-28 ENCOUNTER — Ambulatory Visit (INDEPENDENT_AMBULATORY_CARE_PROVIDER_SITE_OTHER): Payer: Medicare HMO | Admitting: Adult Health

## 2020-01-28 ENCOUNTER — Encounter (INDEPENDENT_AMBULATORY_CARE_PROVIDER_SITE_OTHER): Payer: Self-pay | Admitting: Adult Health

## 2020-01-28 VITALS — BP 109/74 | HR 54 | Temp 97.7°F | Ht 68.0 in | Wt 252.0 lb

## 2020-01-28 DIAGNOSIS — E7849 Other hyperlipidemia: Secondary | ICD-10-CM

## 2020-01-28 DIAGNOSIS — Z6838 Body mass index (BMI) 38.0-38.9, adult: Secondary | ICD-10-CM

## 2020-01-28 DIAGNOSIS — F3289 Other specified depressive episodes: Secondary | ICD-10-CM

## 2020-01-28 MED ORDER — BUPROPION HCL ER (SR) 150 MG PO TB12: 150.0000 mg | ORAL_TABLET | Freq: Every day | ORAL | 0 refills | Status: DC

## 2020-01-28 NOTE — Progress Notes (Signed)
Chief Complaint:   OBESITY Robin SHINAULT is here to discuss her progress with her obesity treatment plan along with follow-up of her obesity related diagnoses. Robin Christensen is on the Category 3 Plan and states she is following her eating plan approximately 95% of the time. Robin Christensen states she is walking 50-55 minutes 5-6 times per week.  Today's visit was #: 12 Starting weight: 296 lbs Starting date: 07/25/2019 Today's weight: 252 lbs Today's date: 01/28/2020 Total lbs lost to date: 46 Total lbs lost since last in-office visit: 4  Interim History: Robin Christensen continues to enjoy the Category 3 meal plan and reports decreased cravings with the recent addition of low dose bupropion. She is scheduled for a steroid injection for chronic back pain tomorrow with her orthopedic specialist. Her ultimate goal is to lose down to 185 lbs. Her cardiologist and PCP are happy if she loses down to 195, which would be a BMI of 29.  Subjective:   Other hyperlipidemia. Robin Christensen is on simvastatin 40 mg. She denies myalgias.   Lab Results  Component Value Date   CHOL 164 11/14/2019   HDL 63 11/14/2019   LDLCALC 87 11/14/2019   TRIG 75 11/14/2019   Lab Results  Component Value Date   ALT 18 11/14/2019   AST 20 11/14/2019   ALKPHOS 132 (H) 11/14/2019   BILITOT 0.5 11/14/2019   The ASCVD Risk score Robin Bussing DC Jr., Robin al., Robin Christensen) failed to calculate for the following reasons:   Unable to determine if patient is Non-Hispanic African American  Other depression, with emotional eating. Robin Christensen is struggling with emotional eating and using food for comfort to the extent that it is negatively impacting her health. She has been working on behavior modification techniques to help reduce her emotional eating and has been somewhat successful. She shows no sign of suicidal or homicidal ideations. Robin Christensen was started on bupropion SR 150 mg daily and reports a reduction in her overall cravings. Blood pressure is  excellent today.  She again denies hx of seizures.  Assessment/Plan:   Other hyperlipidemia. Cardiovascular risk and specific lipid/LDL goals reviewed.  We discussed several lifestyle modifications today and Robin Christensen will continue to work on diet, exercise and weight loss efforts. Orders and follow up as documented in patient record. Robin Christensen will continue current statin therapy and will continue following the Category 3 meal plan.  Counseling Intensive lifestyle modifications are the first line treatment for this issue. . Dietary changes: Increase soluble fiber. Decrease simple carbohydrates. . Exercise changes: Moderate to vigorous-intensity aerobic activity 150 minutes per week if tolerated. . Lipid-lowering medications: see documented in medical record.  Other depression, with emotional eating. Behavior modification techniques were discussed today to help Robin Christensen deal with her emotional/non-hunger eating behaviors.  Orders and follow up as documented in patient record. Refill was given for buPROPion (WELLBUTRIN SR) 150 MG 12 hr tablet #30 with 0 refills.  Class 2 severe obesity with serious comorbidity and body mass index (BMI) of 38.0 to 38.9 in adult, unspecified obesity type ([place]).  Robin Christensen is currently in the action stage of change. As such, her goal is to continue with weight loss efforts. She has agreed to the Category 3 Plan.   Exercise goals: Robin Christensen will continue her current exercise regimen.   Behavioral modification strategies: increasing lean protein intake, meal planning and cooking strategies and planning for success.  Robin Christensen has agreed to follow-up with our clinic in 3 weeks. She was informed of the importance of frequent  follow-up visits to maximize her success with intensive lifestyle modifications for her multiple health conditions.   Objective:   Blood pressure 109/74, pulse (!) 54, temperature 97.7 F (36.5 C), temperature source Oral, height 5\' 8"  (1.727 m),  weight 252 lb (114.3 kg), SpO2 96 %. Body mass index is 38.32 kg/m.  General: Cooperative, alert, well developed, in no acute distress. HEENT: Conjunctivae and lids unremarkable. Cardiovascular: Regular rhythm.  Lungs: Normal work of breathing. Neurologic: No focal deficits.   Lab Results  Component Value Date   CREATININE 0.95 11/14/2019   BUN 15 11/14/2019   NA 142 11/14/2019   K 4.3 11/14/2019   CL 106 11/14/2019   CO2 24 11/14/2019   Lab Results  Component Value Date   ALT 18 11/14/2019   AST 20 11/14/2019   ALKPHOS 132 (H) 11/14/2019   BILITOT 0.5 11/14/2019   Lab Results  Component Value Date   HGBA1C 5.6 11/14/2019   HGBA1C 5.4 07/25/2019   Lab Results  Component Value Date   INSULIN 6.5 11/14/2019   INSULIN 10.3 07/25/2019   Lab Results  Component Value Date   TSH 1.810 11/14/2019   Lab Results  Component Value Date   CHOL 164 11/14/2019   HDL 63 11/14/2019   LDLCALC 87 11/14/2019   TRIG 75 11/14/2019   Lab Results  Component Value Date   WBC 6.4 07/25/2019   HGB 14.9 07/25/2019   HCT 46.7 (H) 07/25/2019   MCV 89 07/25/2019   PLT 257 07/25/2019   Lab Results  Component Value Date   IRON 97 07/25/2019   TIBC 374 07/25/2019   FERRITIN 91 07/25/2019   Obesity Behavioral Intervention Documentation for Insurance:   Approximately 15 minutes were spent on the discussion below.  ASK: We discussed the diagnosis of obesity with Robin Christensen today and Robin Christensen agreed to give [name] permission to discuss obesity behavioral modification therapy today.  ASSESS: Robin Christensen has the diagnosis of obesity and her BMI today is 38.4. Robin Christensen is in the action stage of change.   ADVISE: Robin Christensen was educated on the multiple health risks of obesity as well as the benefit of weight loss to improve her health. She was advised of the need for long term treatment and the importance of lifestyle modifications to improve her current health and to decrease her risk of future  health problems.  AGREE: Multiple dietary modification options and treatment options were discussed and Robin Christensen agreed to follow the recommendations documented in the above note.  ARRANGE: Robin Christensen was educated on the importance of frequent visits to treat obesity as outlined per CMS and USPSTF guidelines and agreed to schedule her next follow up appointment today.  Attestation Statements:   Reviewed by clinician on day of visit: allergies, medications, problem list, medical history, surgical history, family history, social history, and previous encounter notes.  I, Robin Christensen, am acting as Location manager for [employer], NP-C   I have reviewed the above documentation for accuracy and completeness, and I agree with the above. -  Robin Christensen Grandchild, NP

## 2020-01-29 DIAGNOSIS — M5136 Other intervertebral disc degeneration, lumbar region: Secondary | ICD-10-CM | POA: Diagnosis not present

## 2020-02-18 DIAGNOSIS — N6320 Unspecified lump in the left breast, unspecified quadrant: Secondary | ICD-10-CM | POA: Diagnosis not present

## 2020-02-19 ENCOUNTER — Other Ambulatory Visit: Payer: Self-pay

## 2020-02-19 ENCOUNTER — Ambulatory Visit (INDEPENDENT_AMBULATORY_CARE_PROVIDER_SITE_OTHER): Payer: Medicare HMO | Admitting: Adult Health

## 2020-02-19 ENCOUNTER — Encounter (INDEPENDENT_AMBULATORY_CARE_PROVIDER_SITE_OTHER): Payer: Self-pay | Admitting: Adult Health

## 2020-02-19 VITALS — BP 115/80 | HR 56 | Temp 98.1°F | Ht 68.0 in | Wt 248.0 lb

## 2020-02-19 DIAGNOSIS — E7849 Other hyperlipidemia: Secondary | ICD-10-CM

## 2020-02-19 DIAGNOSIS — Z6837 Body mass index (BMI) 37.0-37.9, adult: Secondary | ICD-10-CM | POA: Diagnosis not present

## 2020-02-19 DIAGNOSIS — F3289 Other specified depressive episodes: Secondary | ICD-10-CM

## 2020-02-19 MED ORDER — BUPROPION HCL ER (SR) 150 MG PO TB12: 150.0000 mg | ORAL_TABLET | Freq: Every day | ORAL | 0 refills | Status: DC

## 2020-02-19 NOTE — Progress Notes (Signed)
Chief Complaint:   OBESITY Robin Christensen is here to discuss her progress with her obesity treatment plan along with follow-up of her obesity related diagnoses. Robin Christensen is on the Category 3 Plan and states she is following her eating plan approximately 95% of the time. Robin Christensen states she is walking 2.5-3 miles 60 minutes 7 times per week.  Today's visit was #: 13 Starting weight: 296 lbs Starting date: 07/25/2019 Today's weight: 248 lbs Today's date: 02/19/2020 Total lbs lost to date: 48 Total lbs lost since last in-office visit: 4  Interim History: Robin Christensen continues to enjoy the Category 3 meal plan and denies excessive cravings or polyphagia. Breakfast is usually an omelette with fat free cheese and 45 calorie toast. Lunch is a meat sandwich with fruit. Dinner is usually chicken with green beans and states she will have lean red meat once a week. Her ultimate goal is to lose down to around 185 lbs.  Subjective:   Other hyperlipidemia. LDL was improved at last check. Robin Christensen is on pravastatin 40 mg daily and denies myalgias or cardiac symptoms.  Lab Results  Component Value Date   CHOL 164 11/14/2019   HDL 63 11/14/2019   LDLCALC 87 11/14/2019   TRIG 75 11/14/2019   Lab Results  Component Value Date   ALT 18 11/14/2019   AST 20 11/14/2019   ALKPHOS 132 (H) 11/14/2019   BILITOT 0.5 11/14/2019   The ASCVD Risk score Mikey Bussing DC Jr., et al., 2013) failed to calculate for the following reasons:   Unable to determine if patient is Non-Hispanic African American  Other depression, with emotional eating. Robin Christensen is struggling with emotional eating and using food for comfort to the extent that it is negatively impacting her health. She has been working on behavior modification techniques to help reduce her emotional eating and has been somewhat successful. She shows no sign of suicidal or homicidal ideations. Robin Christensen has noticed a dramatic reduction in neurologic  hunger/cravings since starting bupropion. Blood pressure is excellent at today's office visit.  Assessment/Plan:   Other hyperlipidemia. Cardiovascular risk and specific lipid/LDL goals reviewed.  We discussed several lifestyle modifications today and Robin Christensen will continue to work on diet, exercise and weight loss efforts. Orders and follow up as documented in patient record. She will continue her current statin therapy as directed and will continue the Category 3 meal plan.  Counseling Intensive lifestyle modifications are the first line treatment for this issue. . Dietary changes: Increase soluble fiber. Decrease simple carbohydrates. . Exercise changes: Moderate to vigorous-intensity aerobic activity 150 minutes per week if tolerated.  . Lipid-lowering medications: see documented in medical record.  Other depression, with emotional eating. Behavior modification techniques were discussed today to help Robin Christensen deal with her emotional/non-hunger eating behaviors.  Orders and follow up as documented in patient record. Robin Christensen was given a refill on her bupropion SR 150 mg daily #30 with 0 refills.  Class 2 severe obesity with serious comorbidity and body mass index (BMI) of 37.0 to 37.9 in adult, unspecified obesity type (Robin Christensen).  Robin Christensen is currently in the action stage of change. As such, her goal is to continue with weight loss efforts. She has agreed to the Category 3 Plan.   Handout was provided on High Calorie/Low Calorie Food.  Exercise goals: Robin Christensen will continue her current exercise regimen.   Behavioral modification strategies: increasing lean protein intake, increasing water intake, meal planning and cooking strategies, travel eating strategies and planning for success.  Robin Christensen  has agreed to follow-up with our clinic in 3 weeks. She was informed of the importance of frequent follow-up visits to maximize her success with intensive lifestyle modifications for her multiple health  conditions.   Objective:   Blood pressure 115/80, pulse (!) 56, temperature 98.1 F (36.7 C), temperature source Oral, height 5\' 8"  (1.727 m), weight 248 lb (112.5 kg), SpO2 97 %. Body mass index is 37.71 kg/m.  General: Cooperative, alert, well developed, in no acute distress. HEENT: Conjunctivae and lids unremarkable. Cardiovascular: Regular rhythm.  Lungs: Normal work of breathing. Neurologic: No focal deficits.   Lab Results  Component Value Date   CREATININE 0.95 11/14/2019   BUN 15 11/14/2019   NA 142 11/14/2019   K 4.3 11/14/2019   CL 106 11/14/2019   CO2 24 11/14/2019   Lab Results  Component Value Date   ALT 18 11/14/2019   AST 20 11/14/2019   ALKPHOS 132 (H) 11/14/2019   BILITOT 0.5 11/14/2019   Lab Results  Component Value Date   HGBA1C 5.6 11/14/2019   HGBA1C 5.4 07/25/2019   Lab Results  Component Value Date   INSULIN 6.5 11/14/2019   INSULIN 10.3 07/25/2019   Lab Results  Component Value Date   TSH 1.810 11/14/2019   Lab Results  Component Value Date   CHOL 164 11/14/2019   HDL 63 11/14/2019   LDLCALC 87 11/14/2019   TRIG 75 11/14/2019   Lab Results  Component Value Date   WBC 6.4 07/25/2019   HGB 14.9 07/25/2019   HCT 46.7 (H) 07/25/2019   MCV 89 07/25/2019   PLT 257 07/25/2019   Lab Results  Component Value Date   IRON 97 07/25/2019   TIBC 374 07/25/2019   FERRITIN 91 07/25/2019   Obesity Behavioral Intervention Documentation for Insurance:   Approximately 15 minutes were spent on the discussion below.  ASK: We discussed the diagnosis of obesity with Robin Christensen today and Robin Christensen agreed to give [name] permission to discuss obesity behavioral modification therapy today.  ASSESS: Robin Christensen has the diagnosis of obesity and her BMI today is 37.7. Robin Christensen is in the action stage of change.   ADVISE: Robin Christensen was educated on the multiple health risks of obesity as well as the benefit of weight loss to improve her health. She was advised  of the need for long term treatment and the importance of lifestyle modifications to improve her current health and to decrease her risk of future health problems.  AGREE: Multiple dietary modification options and treatment options were discussed and Robin Christensen agreed to follow the recommendations documented in the above note.  ARRANGE: Jenyfer was educated on the importance of frequent visits to treat obesity as outlined per CMS and USPSTF guidelines and agreed to schedule her next follow up appointment today.  Attestation Statements:   Reviewed by clinician on day of visit: allergies, medications, problem list, medical history, surgical history, family history, social history, and previous encounter notes.  I, Michaelene Song, am acting as Location manager for [employer], NP-C   I have reviewed the above documentation for accuracy and completeness, and I agree with the above. -  Esaw Grandchild, NP

## 2020-03-11 DIAGNOSIS — R350 Frequency of micturition: Secondary | ICD-10-CM | POA: Diagnosis not present

## 2020-03-11 DIAGNOSIS — N3946 Mixed incontinence: Secondary | ICD-10-CM | POA: Diagnosis not present

## 2020-03-12 ENCOUNTER — Ambulatory Visit (INDEPENDENT_AMBULATORY_CARE_PROVIDER_SITE_OTHER): Payer: Medicare HMO | Admitting: Adult Health

## 2020-03-12 ENCOUNTER — Other Ambulatory Visit: Payer: Self-pay

## 2020-03-12 ENCOUNTER — Encounter (INDEPENDENT_AMBULATORY_CARE_PROVIDER_SITE_OTHER): Payer: Self-pay | Admitting: Adult Health

## 2020-03-12 VITALS — BP 114/75 | HR 52 | Temp 97.8°F | Ht 68.0 in | Wt 244.4 lb

## 2020-03-12 DIAGNOSIS — I10 Essential (primary) hypertension: Secondary | ICD-10-CM | POA: Diagnosis not present

## 2020-03-12 DIAGNOSIS — E7849 Other hyperlipidemia: Secondary | ICD-10-CM

## 2020-03-12 DIAGNOSIS — R739 Hyperglycemia, unspecified: Secondary | ICD-10-CM | POA: Diagnosis not present

## 2020-03-12 DIAGNOSIS — F3289 Other specified depressive episodes: Secondary | ICD-10-CM

## 2020-03-12 DIAGNOSIS — Z6837 Body mass index (BMI) 37.0-37.9, adult: Secondary | ICD-10-CM | POA: Diagnosis not present

## 2020-03-12 MED ORDER — BUPROPION HCL ER (SR) 150 MG PO TB12: 150.0000 mg | ORAL_TABLET | Freq: Every day | ORAL | 0 refills | Status: DC

## 2020-03-12 NOTE — Progress Notes (Signed)
Chief Complaint:   OBESITY Robin Christensen is here to discuss her progress with her obesity treatment plan along with follow-up of her obesity related diagnoses. Robin Christensen is on the Category 3 Plan and states she is following her eating plan approximately 90% of the time. Robin Christensen states she is walking 45-60 minutes 3-4 times per week.  Today's visit was #: 14 Starting weight: 296 lbs Starting date: 07/25/2019 Today's weight: 244 lbs Today's date: 03/12/2020 Total lbs lost to date: 52 Total lbs lost since last in-office visit: 4  Interim History: Robin Christensen recently traveled to [place] with 6 other family members and stayed on track with portion control/smart choices and daily beach walking. If she ate out, then she "got right back on track the next meal." She reports increased energy since starting the program.  Subjective:   Other hyperlipidemia. Lovinia is on pravastatin 40 mg daily and denies myalgias. Lipid panel is steadily improving.   Lab Results  Component Value Date   CHOL 164 11/14/2019   HDL 63 11/14/2019   LDLCALC 87 11/14/2019   TRIG 75 11/14/2019   Lab Results  Component Value Date   ALT 18 11/14/2019   AST 20 11/14/2019   ALKPHOS 132 (H) 11/14/2019   BILITOT 0.5 11/14/2019   The ASCVD Risk score Mikey Bussing DC Jr., et al., 2013) failed to calculate for the following reasons:   Unable to determine if patient is Non-Hispanic African American  Other depression, with emotional eating. Robin Christensen is struggling with emotional eating and using food for comfort to the extent that it is negatively impacting her health. She has been working on behavior modification techniques to help reduce her emotional eating and has been somewhat successful. She shows no sign of suicidal or homicidal ideations. Robin Christensen reports excellent control of cravings with bupropion SR 150 mg daily. Blood pressure is stable on today's office visit.  Hyperglycemia. Robin Christensen has a history of some  elevated blood glucose readings without a diagnosis of diabetes. A1c and insulin levels have been elevated in the past. She is not on metformin.  Essential hypertension. Blood pressure is at goal. Robin Christensen denies cardiac symptoms.  BP Readings from Last 3 Encounters:  03/12/20 114/75  02/19/20 115/80  01/28/20 109/74   Lab Results  Component Value Date   CREATININE 0.95 11/14/2019   CREATININE 0.78 07/25/2019   CREATININE 0.80 08/16/2018   Assessment/Plan:   Other hyperlipidemia. Cardiovascular risk and specific lipid/LDL goals reviewed.  We discussed several lifestyle modifications today and Robin Christensen will continue to work on diet, exercise and weight loss efforts. Orders and follow up as documented in patient record. She will continue statin therapy as directed. CMP and Lipid panel will be checked today.  Counseling Intensive lifestyle modifications are the first line treatment for this issue. . Dietary changes: Increase soluble fiber. Decrease simple carbohydrates. . Exercise changes: Moderate to vigorous-intensity aerobic activity 150 minutes per week if tolerated. . Lipid-lowering medications: see documented in medical record.   Other depression, with emotional eating. Behavior modification techniques were discussed today to help Robin Christensen deal with her emotional/non-hunger eating behaviors.  Orders and follow up as documented in patient record. Refill was given for buPROPion (WELLBUTRIN SR) 150 MG 12 hr tablet daily #30 with 0 refills.  Hyperglycemia. Fasting labs will be obtained and results with be discussed with Robin Christensen in 2 weeks at her follow up visit. In the meanwhile Robin Christensen was started on a lower simple carbohydrate diet and will work on [employer]  loss efforts. She will increase protein and limit simple carbohydrates. Labs will be checked today.  Essential hypertension. Robin Christensen is working on healthy weight loss and exercise to improve blood pressure control. We will watch for  signs of hypotension as she continues her lifestyle modifications. Labs will be checked today.  Class 2 severe obesity with serious comorbidity and body mass index (BMI) of 37.0 to 37.9 in adult, unspecified obesity type (Robin Christensen).  Robin Christensen is currently in the action stage of change. As such, her goal is to continue with weight loss efforts. She has agreed to the Category 3 Plan.   Exercise goals: Robin Christensen will continue her current exercise regimen.   Behavioral modification strategies: increasing lean protein intake, meal planning and cooking strategies, travel eating strategies, celebration eating strategies and planning for success.  Robin Christensen has agreed to follow-up with our clinic in 3 weeks. She was informed of the importance of frequent follow-up visits to maximize her success with intensive lifestyle modifications for her multiple health conditions.   Robin Christensen was informed we would discuss her lab results at her next visit unless there is a critical issue that needs to be addressed sooner. Robin Christensen agreed to keep her next visit at the agreed upon time to discuss these results.  Objective:   Blood pressure 114/75, pulse (!) 52, temperature 97.8 F (36.6 C), temperature source Oral, height 5\' 8"  (1.727 m), weight 244 lb 6.4 oz (110.9 kg), SpO2 97 %. Body mass index is 37.16 kg/m.  General: Cooperative, alert, well developed, in no acute distress. HEENT: Conjunctivae and lids unremarkable. Cardiovascular: Regular rhythm.  Lungs: Normal work of breathing. Neurologic: No focal deficits.   Lab Results  Component Value Date   CREATININE 0.95 11/14/2019   BUN 15 11/14/2019   NA 142 11/14/2019   K 4.3 11/14/2019   CL 106 11/14/2019   CO2 24 11/14/2019   Lab Results  Component Value Date   ALT 18 11/14/2019   AST 20 11/14/2019   ALKPHOS 132 (H) 11/14/2019   BILITOT 0.5 11/14/2019   Lab Results  Component Value Date   HGBA1C 5.6 11/14/2019   HGBA1C 5.4 07/25/2019   Lab  Results  Component Value Date   INSULIN 6.5 11/14/2019   INSULIN 10.3 07/25/2019   Lab Results  Component Value Date   TSH 1.810 11/14/2019   Lab Results  Component Value Date   CHOL 164 11/14/2019   HDL 63 11/14/2019   LDLCALC 87 11/14/2019   TRIG 75 11/14/2019   Lab Results  Component Value Date   WBC 6.4 07/25/2019   HGB 14.9 07/25/2019   HCT 46.7 (H) 07/25/2019   MCV 89 07/25/2019   PLT 257 07/25/2019   Lab Results  Component Value Date   IRON 97 07/25/2019   TIBC 374 07/25/2019   FERRITIN 91 07/25/2019   Obesity Behavioral Intervention Documentation for Insurance:   Approximately 15 minutes were spent on the discussion below.  ASK: We discussed the diagnosis of obesity with Robin Christensen today and Mariachristina agreed to give [name] permission to discuss obesity behavioral modification therapy today.  ASSESS: Robin Christensen has the diagnosis of obesity and her BMI today is 37.2. Robin Christensen is in the action stage of change.   ADVISE: Robin Christensen was educated on the multiple health risks of obesity as well as the benefit of weight loss to improve her health. She was advised of the need for long term treatment and the importance of lifestyle modifications to improve her current health and to decrease  her risk of future health problems.  AGREE: Multiple dietary modification options and treatment options were discussed and Robin Christensen agreed to follow the recommendations documented in the above note.  ARRANGE: Laurielle was educated on the importance of frequent visits to treat obesity as outlined per CMS and USPSTF guidelines and agreed to schedule her next follow up appointment today.  Attestation Statements:   Reviewed by clinician on day of visit: allergies, medications, problem list, medical history, surgical history, family history, social history, and previous encounter notes.  I, Michaelene Song, am acting as Location manager for [employer], NP-C   I have reviewed the above  documentation for accuracy and completeness, and I agree with the above. -  Esaw Grandchild, NP

## 2020-03-13 LAB — COMPREHENSIVE METABOLIC PANEL
ALT: 15 IU/L (ref 0–32)
AST: 19 IU/L (ref 0–40)
Albumin/Globulin Ratio: 1.7 (ref 1.2–2.2)
Albumin: 4.1 g/dL (ref 3.8–4.8)
Alkaline Phosphatase: 109 IU/L (ref 48–121)
BUN/Creatinine Ratio: 18 (ref 12–28)
BUN: 16 mg/dL (ref 8–27)
Bilirubin Total: 0.4 mg/dL (ref 0.0–1.2)
CO2: 28 mmol/L (ref 20–29)
Calcium: 9.6 mg/dL (ref 8.7–10.3)
Chloride: 103 mmol/L (ref 96–106)
Creatinine, Ser: 0.89 mg/dL (ref 0.57–1.00)
GFR calc Af Amer: 76 mL/min/{1.73_m2} (ref >59)
GFR calc non Af Amer: 66 mL/min/{1.73_m2} (ref >59)
Globulin, Total: 2.4 g/dL (ref 1.5–4.5)
Glucose: 88 mg/dL (ref 65–99)
Potassium: 4.1 mmol/L (ref 3.5–5.2)
Sodium: 142 mmol/L (ref 134–144)
Total Protein: 6.5 g/dL (ref 6.0–8.5)

## 2020-03-13 LAB — HEMOGLOBIN A1C
Est. average glucose Bld gHb Est-mCnc: 108 mg/dL
Hgb A1c MFr Bld: 5.4 % (ref 4.8–5.6)

## 2020-03-13 LAB — LIPID PANEL
Chol/HDL Ratio: 2.6 ratio (ref 0.0–4.4)
Cholesterol, Total: 160 mg/dL (ref 100–199)
HDL: 61 mg/dL (ref >39)
LDL Chol Calc (NIH): 84 mg/dL (ref 0–99)
Triglycerides: 80 mg/dL (ref 0–149)
VLDL Cholesterol Cal: 15 mg/dL (ref 5–40)

## 2020-03-13 LAB — INSULIN, RANDOM: INSULIN: 6.9 u[IU]/mL (ref 2.6–24.9)

## 2020-04-02 ENCOUNTER — Encounter (INDEPENDENT_AMBULATORY_CARE_PROVIDER_SITE_OTHER): Payer: Self-pay | Admitting: Adult Health

## 2020-04-02 ENCOUNTER — Ambulatory Visit (INDEPENDENT_AMBULATORY_CARE_PROVIDER_SITE_OTHER): Payer: Medicare HMO | Admitting: Adult Health

## 2020-04-02 ENCOUNTER — Other Ambulatory Visit: Payer: Self-pay

## 2020-04-02 VITALS — BP 108/70 | HR 52 | Temp 97.6°F | Ht 68.0 in | Wt 240.0 lb

## 2020-04-02 DIAGNOSIS — R739 Hyperglycemia, unspecified: Secondary | ICD-10-CM

## 2020-04-02 DIAGNOSIS — E559 Vitamin D deficiency, unspecified: Secondary | ICD-10-CM

## 2020-04-02 DIAGNOSIS — Z6836 Body mass index (BMI) 36.0-36.9, adult: Secondary | ICD-10-CM | POA: Diagnosis not present

## 2020-04-02 DIAGNOSIS — E66812 Morbid (severe) obesity due to excess calories: Secondary | ICD-10-CM

## 2020-04-02 DIAGNOSIS — F3289 Other specified depressive episodes: Secondary | ICD-10-CM | POA: Diagnosis not present

## 2020-04-02 DIAGNOSIS — E782 Mixed hyperlipidemia: Secondary | ICD-10-CM

## 2020-04-02 MED ORDER — BUPROPION HCL ER (SR) 150 MG PO TB12: 150.0000 mg | ORAL_TABLET | Freq: Every day | ORAL | 0 refills | Status: DC

## 2020-04-06 DIAGNOSIS — Z0000 Encounter for general adult medical examination without abnormal findings: Secondary | ICD-10-CM | POA: Diagnosis not present

## 2020-04-06 DIAGNOSIS — E785 Hyperlipidemia, unspecified: Secondary | ICD-10-CM | POA: Diagnosis not present

## 2020-04-06 DIAGNOSIS — E538 Deficiency of other specified B group vitamins: Secondary | ICD-10-CM | POA: Diagnosis not present

## 2020-04-06 DIAGNOSIS — E039 Hypothyroidism, unspecified: Secondary | ICD-10-CM | POA: Diagnosis not present

## 2020-04-06 DIAGNOSIS — Z23 Encounter for immunization: Secondary | ICD-10-CM | POA: Diagnosis not present

## 2020-04-06 DIAGNOSIS — D6869 Other thrombophilia: Secondary | ICD-10-CM | POA: Diagnosis not present

## 2020-04-06 DIAGNOSIS — I4891 Unspecified atrial fibrillation: Secondary | ICD-10-CM | POA: Diagnosis not present

## 2020-04-06 DIAGNOSIS — M8580 Other specified disorders of bone density and structure, unspecified site: Secondary | ICD-10-CM | POA: Diagnosis not present

## 2020-04-06 DIAGNOSIS — M5430 Sciatica, unspecified side: Secondary | ICD-10-CM | POA: Diagnosis not present

## 2020-04-06 DIAGNOSIS — R609 Edema, unspecified: Secondary | ICD-10-CM | POA: Diagnosis not present

## 2020-04-06 DIAGNOSIS — I10 Essential (primary) hypertension: Secondary | ICD-10-CM | POA: Diagnosis not present

## 2020-04-06 DIAGNOSIS — R32 Unspecified urinary incontinence: Secondary | ICD-10-CM | POA: Diagnosis not present

## 2020-04-07 DIAGNOSIS — R928 Other abnormal and inconclusive findings on diagnostic imaging of breast: Secondary | ICD-10-CM | POA: Diagnosis not present

## 2020-04-07 DIAGNOSIS — N6489 Other specified disorders of breast: Secondary | ICD-10-CM | POA: Diagnosis not present

## 2020-04-07 NOTE — Progress Notes (Signed)
Chief Complaint:   OBESITY Robin Christensen is here to discuss her progress with her obesity treatment plan along with follow-up of her obesity related diagnoses. Robin Christensen is on the Category 3 Plan and states she is following her eating plan approximately 90% of the time. Adam states she is walking 60 minutes 7 times per week.  Today's visit was #: 15 Starting weight: 296 lbs Starting date: 07/25/2019 Today's weight: 240 lbs Today's date: 04/02/2020 Total lbs lost to date: 56 Total lbs lost since last in-office visit: 4  Interim History: Robin Christensen continues to enjoy the foods and structure of the Category 3 meal plan. She is able to walk daily despite chronic orthopedic pain and reports increased chronic lower back pain. She received an injection with Dr. Mina Christensen of [place] in July 2021 and reports no relief with last inervention. She has a follow-up with Dr. Mina Christensen scheduled in October 2021. Last MRI of the lumbar spine was in May 2020.  Subjective:   Mixed hyperlipidemia. Labs checked on 03/12/2020 noted total and LDL cholesterol levels continued to improve. Robin Christensen is on pravastatin 40 mg daily and denies myalgias. Last CMP and LFT's within normal limits. Labs were discussed with the patient today.   Lab Results  Component Value Date   CHOL 160 03/12/2020   HDL 61 03/12/2020   LDLCALC 84 03/12/2020   TRIG 80 03/12/2020   CHOLHDL 2.6 03/12/2020   Lab Results  Component Value Date   ALT 15 03/12/2020   AST 19 03/12/2020   ALKPHOS 109 03/12/2020   BILITOT 0.4 03/12/2020   The ASCVD Risk score Robin Bussing DC Jr., Robin al., Robin Christensen) failed to calculate for the following reasons:   Unable to determine if patient is Non-Hispanic African American  Other depression, with emotional eating. Robin Christensen is struggling with emotional eating and using food for comfort to the extent that it is negatively impacting her health. She has been working on behavior modification techniques to help  reduce her emotional eating and has been somewhat successful. She shows no sign of suicidal or homicidal ideations. Blood pressure and heart rate stable. Zarria reports stable mood and denies emotional eating. She is on bupropion SR 150 mg daily.  Hyperglycemia. Blood glucose, A1c, and insulin levels were all at goal on 03/12/2020! Robin Christensen is not on metformin and has reduced her A1c and insulin both with lifestyle modification! Labs were discussed with the patient today.   Vitamin D deficiency. Initial check of Vitamin D level on 07/25/2019 was 23.5. Robin Christensen is on OTC Vitamin D3 1,000 IU once daily. Labs were discussed with the patient today.    Ref. Range 11/14/2019 12:53  Vitamin D, 25-Hydroxy Latest Ref Range: 30.0 - 100.0 ng/mL 42.1   Assessment/Plan:   Mixed hyperlipidemia. Cardiovascular risk and specific lipid/LDL goals reviewed.  We discussed several lifestyle modifications today and Robin Christensen will continue to work on diet, exercise and weight loss efforts. Orders and follow up as documented in patient record. She will continue healthy eating and continue statin therapy as directed.   Counseling Intensive lifestyle modifications are the first line treatment for this issue. . Dietary changes: Increase soluble fiber. Decrease simple carbohydrates. . Exercise changes: Moderate to vigorous-intensity aerobic activity 150 minutes per week if tolerated. . Lipid-lowering medications: see documented in medical record.  Other depression, with emotional eating. Behavior modification techniques were discussed today to help Harbour deal with her emotional/non-hunger eating behaviors.  Orders and follow up as documented in patient record.  Refill was given for buPROPion (WELLBUTRIN SR) 150 MG 12 hr tablet daily #30 with 0 refills.  Hyperglycemia. Fasting labs will be obtained and results with be discussed with Robin Christensen in 2 weeks at her follow up visit. In the meanwhile Damoni was started on a  lower simple carbohydrate diet and will work on weight loss efforts. She will continue to follow the Category 3 meal plan.  Vitamin D deficiency. Low Vitamin D level contributes to fatigue and are associated with obesity, breast, and colon cancer. She agrees to continue to take OTC Vitamin D3 1,000 IU once daily as directed and will follow-up for routine testing of Vitamin D, at least 2-3 times per year to avoid over-replacement.  Class 2 severe obesity with serious comorbidity and body mass index (BMI) of 36.0 to 36.9 in adult, unspecified obesity type (Robin Christensen).  Robin Christensen is currently in the action stage of change. As such, her goal is to continue with weight loss efforts. She has agreed to the Category 3 Plan.   Exercise goals: Sharmane will continue her current exercise regimen.   Behavioral modification strategies: increasing lean protein intake, no skipping meals and planning for success.  Anabell has agreed to follow-up with our clinic in 3 weeks. She was informed of the importance of frequent follow-up visits to maximize her success with intensive lifestyle modifications for her multiple health conditions.   Objective:   Blood pressure 108/70, pulse (!) 52, temperature 97.6 F (36.4 C), height 5\' 8"  (1.727 m), weight 240 lb (108.9 kg), SpO2 100 %. Body mass index is 36.49 kg/m.  General: Cooperative, alert, well developed, in no acute distress. HEENT: Conjunctivae and lids unremarkable. Cardiovascular: Regular rhythm.  Lungs: Normal work of breathing. Neurologic: No focal deficits.   Lab Results  Component Value Date   CREATININE 0.89 03/12/2020   BUN 16 03/12/2020   NA 142 03/12/2020   K 4.1 03/12/2020   CL 103 03/12/2020   CO2 28 03/12/2020   Lab Results  Component Value Date   ALT 15 03/12/2020   AST 19 03/12/2020   ALKPHOS 109 03/12/2020   BILITOT 0.4 03/12/2020   Lab Results  Component Value Date   HGBA1C 5.4 03/12/2020   HGBA1C 5.6 11/14/2019   HGBA1C 5.4  07/25/2019   Lab Results  Component Value Date   INSULIN 6.9 03/12/2020   INSULIN 6.5 11/14/2019   INSULIN 10.3 07/25/2019   Lab Results  Component Value Date   TSH 1.810 11/14/2019   Lab Results  Component Value Date   CHOL 160 03/12/2020   HDL 61 03/12/2020   LDLCALC 84 03/12/2020   TRIG 80 03/12/2020   CHOLHDL 2.6 03/12/2020   Lab Results  Component Value Date   WBC 6.4 07/25/2019   HGB 14.9 07/25/2019   HCT 46.7 (H) 07/25/2019   MCV 89 07/25/2019   PLT 257 07/25/2019   Lab Results  Component Value Date   IRON 97 07/25/2019   TIBC 374 07/25/2019   FERRITIN 91 07/25/2019   Obesity Behavioral Intervention:   Approximately 15 minutes were spent on the discussion below.  ASK: We discussed the diagnosis of obesity with Robin Christensen today and Kimberely agreed to give [name] permission to discuss obesity behavioral modification therapy today.  ASSESS: Abrina has the diagnosis of obesity and her BMI today is 36.5. Ramey is in the action stage of change.   ADVISE: Corayma was educated on the multiple health risks of obesity as well as the benefit of weight loss to  improve her health. She was advised of the need for long term treatment and the importance of lifestyle modifications to improve her current health and to decrease her risk of future health problems.  AGREE: Multiple dietary modification options and treatment options were discussed and Tisa agreed to follow the recommendations documented in the above note.  ARRANGE: Abigaile was educated on the importance of frequent visits to treat obesity as outlined per CMS and USPSTF guidelines and agreed to schedule her next follow up appointment today.  Attestation Statements:   Reviewed by clinician on day of visit: allergies, medications, problem list, medical history, surgical history, family history, social history, and previous encounter notes.  I, Robin Christensen, am acting as Location manager for [employer],  NP-C   I have reviewed the above documentation for accuracy and completeness, and I agree with the above. -  Robin Grandchild, NP

## 2020-04-08 DIAGNOSIS — E559 Vitamin D deficiency, unspecified: Secondary | ICD-10-CM | POA: Insufficient documentation

## 2020-04-22 ENCOUNTER — Other Ambulatory Visit: Payer: Self-pay

## 2020-04-22 ENCOUNTER — Encounter: Payer: Self-pay | Admitting: Physical Therapy

## 2020-04-22 ENCOUNTER — Ambulatory Visit (INDEPENDENT_AMBULATORY_CARE_PROVIDER_SITE_OTHER): Payer: Medicare HMO | Admitting: Physical Therapy

## 2020-04-22 DIAGNOSIS — M5441 Lumbago with sciatica, right side: Secondary | ICD-10-CM

## 2020-04-22 DIAGNOSIS — G8929 Other chronic pain: Secondary | ICD-10-CM | POA: Diagnosis not present

## 2020-04-22 DIAGNOSIS — M6281 Muscle weakness (generalized): Secondary | ICD-10-CM

## 2020-04-22 DIAGNOSIS — M62838 Other muscle spasm: Secondary | ICD-10-CM

## 2020-04-22 NOTE — Patient Instructions (Signed)
Access Code: [email or web address] URL: [email or web address] Date: 04/22/2020 Prepared by: Jeral Pinch  Exercises Standing Glute Med Mobilization with Wyvonnia Lora on Wall - 2 x daily - 30-60 sec hold Seated Figure 4 Piriformis Stretch - 2 x daily - 1 reps - 30-45 sec hold Supine Single Knee to Chest Stretch - 2 x daily - 1 reps - 30-45 sec hold Supine ITB Stretch with Strap - 2 x daily - 1 reps - 30-45 sec hold

## 2020-04-22 NOTE — Therapy (Signed)
[facility] [place], [place], [address] Phone: [phone]   Fax:  [phone]  Physical Therapy Evaluation  Patient Details  Name: Robin Christensen MRN: [id number] Date of Birth: 11/11/1950 Referring Provider (PT): Dr London Pepper   Encounter Date: 04/22/2020   PT End of Session - 04/22/20 1144    Visit Number 1    Number of Visits 8    Date for PT Re-Evaluation 05/20/20    Authorization Type [employer] MCR    PT Start Time 6962    PT Stop Time 1242    PT Time Calculation (min) 57 min    Activity Tolerance Patient limited by pain    Behavior During Therapy [facility] for tasks assessed/performed           Past Medical History:  Diagnosis Date  . A-fib ([place])   . B12 deficiency   . Back pain   . Breast cancer ([place])   . Cancer [facility])    Right Breast Cancer  . Chronic headaches   . Dysrhythmia    AF  . Edema, lower extremity   . Gallbladder disease   . GERD (gastroesophageal reflux disease)   . Hyperlipidemia   . Hypertension   . Hypothyroidism   . Joint pain   . Obesity   . Pneumonia   . PONV (postoperative nausea and vomiting)   . Sleep apnea    cpcap  . Vitamin deficiency     Past Surgical History:  Procedure Laterality Date  . BREAST SURGERY    . CARDIOVERSION N/A 06/26/2014   Procedure: CARDIOVERSION;  Surgeon: Laverda Page, MD;  Location: [facility];  Service: Cardiovascular;  Laterality: N/A;  . CATARACT EXTRACTION    . CHOLECYSTECTOMY    . DILATION AND CURETTAGE OF UTERUS    . eyelid surgery    . TOTAL KNEE ARTHROPLASTY Right 05/03/2016   Procedure: TOTAL KNEE ARTHROPLASTY;  Surgeon: Melrose Nakayama, MD;  Location: [facility];  Service: Orthopedics;  Laterality: Right;  . TOTAL KNEE ARTHROPLASTY Left 08/14/2018   Procedure: TOTAL KNEE ARTHROPLASTY;  Surgeon: Melrose Nakayama, MD;  Location: [place];  Service: Orthopedics;  Laterality: Left;    There were no vitals filed for this  visit.    Subjective Assessment - 04/22/20 1145    Subjective Pt reports she has a h/o low back pain, she had a TKA last year and the medication took her pain away. She states her back pain has returned since being off the meds.  She is having epidural back injections, the first 2-3 felped some, this last one did not help.  She is scheduled for 1 more next week.  Currenlty her pain is in the Rt SIJ area and moves over into the hip and down to her toes. Only tolerated 3-4 hrs of sleep and needs meds to help with this.  She is also having rebound headaches from medications per MD Her pain eases after 9-10 AM.  In the last 3 months she has had a couple incidents the she had severe leg pain and was unable to move for a few minutes.    Limitations Other (comment)   sleeping is the most irritating and has tried multiple beds and recliner.   How long can you sit comfortably? <10'    Diagnostic tests MRI and xrays    Patient Stated Goals get 50-60% better with less pain    Currently in Pain? Yes    Pain Score 4  Pain Location Buttocks    Pain Orientation Right    Pain Descriptors / Indicators Sharp    Pain Type Chronic pain    Pain Onset More than a month ago    Pain Frequency Constant    Aggravating Factors  sleeping, feels like she is sitting on a ball    Pain Relieving Factors gentle movement and ignoring it              [facility] PT Assessment - 04/22/20 0001      Assessment   Medical Diagnosis Sciatic pain     Referring Provider (PT) Dr London Pepper    Onset Date/Surgical Date 04/23/19    Hand Dominance Right    Next MD Visit PRN    Prior Therapy yes      Precautions   Precautions None      Balance Screen   Has the patient fallen in the past 6 months No    Has the patient had a decrease in activity level because of a fear of falling?  No    Is the patient reluctant to leave their home because of a fear of falling?  Yes   slight fear d/t not knowing when the pain will hit here       [place] residence    Home Layout Two level   no trouble      Prior Function   Level of [place] Retired    Leisure walks, work in her Building services engineer, travel      Observation/Other Assessments   Focus on Therapeutic Outcomes (FOTO)  48% limited       Sensation   Light Touch Appears Intact    Hot/Cold Appears Intact      Functional Tests   Functional tests Squat;Single Leg Squat      Squat   Comments slight knee adduction      Single Leg Squat   Comments > 6 sec bilat       Posture/Postural Control   Posture/Postural Control Postural limitations    Postural Limitations Rounded Shoulders;Forward head;Decreased lumbar lordosis      ROM / Strength   AROM / PROM / Strength AROM;Strength      AROM   AROM Assessment Site Hip;Lumbar    Lumbar Flexion to top of knees with Rt side pain    Lumbar Extension 25% present - compression in Rt SIJ    Lumbar - Right Rotation WNL    Lumbar - Left Rotation WNL      Strength   Strength Assessment Site Hip;Knee;Ankle;Lumbar    Right/Left Hip Right   LT WNL   Right Hip Flexion 4/5    Right Hip Extension 4-/5    Right Hip ABduction 3+/5    Right/Left Knee --   5/5   Right/Left Ankle --   5/5   Lumbar Flexion --   poor   Lumbar Extension --   poor     Flexibility   Soft Tissue Assessment /Muscle Length yes   tight Rt gluts    Hamstrings good bilat    ITB tight Rt    Piriformis tight Rt with pain      Palpation   Spinal mobility pt did not tolerate d/t pain    Palpation comment hypersensitive to palpation in Rt gluts, piriformis, QL and lumbar paraspinals.       Special Tests   Other special tests (-) SLR and slump  Objective measurements completed on examination: See above findings.       [facility] Adult PT Treatment/Exercise - 04/22/20 0001      Self-Care   Self-Care Other Self-Care Comments    Other Self-Care Comments   self TPR with tennis ball on wall       Exercises   Exercises Other Exercises    Other Exercises  supine SKTC and cross body ITB with strap, seated figure 4 for piriformis       Modalities   Modalities Moist Heat      Moist Heat Therapy   Number Minutes Moist Heat 10 Minutes    Moist Heat Location --   bilat buttocks and lower lumbar in supine                 PT Education - 04/22/20 1236    Education Details POC, HEP and FOTO results    Person(s) Educated Patient    Methods Explanation;Demonstration;Handout    Comprehension Returned demonstration;Verbalized understanding               PT Long Term Goals - 04/22/20 1247      PT LONG TERM GOAL #1   Title independent with HEP for core strengthening ( 05/20/2020)    Time 4    Period Weeks    Status New    Target Date 05/20/20      PT LONG TERM GOAL #2   Title FOTO score improved to </= 42% limited for improved function  ( 05/20/2020)    Time 4    Period Weeks    Status New    Target Date 05/20/20      PT LONG TERM GOAL #3   Title report pain < 2/10 for improved function and mobility ( 05/20/2020)    Time 4    Period Weeks    Status New    Target Date 05/20/20      PT LONG TERM GOAL #4   Title tolerate > 1 hr driviing without increase in Rt sided low back pain ( 05/20/2020)    Time 4    Period Weeks    Status New    Target Date 05/20/20      PT LONG TERM GOAL #5   Title increase Rt hip and low back strength to assist with decreasing pain with sleeping    Time 4    Period Weeks    Status New    Target Date 05/20/20                  Plan - 04/22/20 1237    Clinical Impression Statement 69 yo female with ongoing low back/sciatic pain, she has had 3-4 epidural injections with limited relief and has another scheduled for next week.  She reports pain is worse with sleeping/waking and eases off some aroun9-10 AM.  [name] does have weakness in the Rt hip, a lot of muscular tightness and tenderness  around the hip and into the low back.   She is walking everyday and working on losing weight.  Pt understands she may not be painfree however would like to see improvement of 50-60% if possible so she can perform her daily activities easier.  She would benefit from PT to address her defecits and get her on a good HEP to improve strength and support her spine.    Personal Factors and Comorbidities Comorbidity 3+    Comorbidities see snap chat    Examination-Activity Limitations Transfers;Other;Bend;Sit;Squat  Examination-Participation Restrictions Yard Work;Shop;Driving;Other    Stability/Clinical Decision Making Stable/Uncomplicated    Clinical Decision Making Low    Rehab Potential Good    PT Frequency 2x / week    PT Duration 4 weeks    PT Treatment/Interventions Iontophoresis 4mg /ml Dexamethasone;Taping;Patient/family education;Functional mobility training;Moist Heat;Traction;Therapeutic activities;Passive range of motion;Therapeutic exercise;Cryotherapy;Electrical Stimulation;Manual techniques;Dry needling;Spinal Manipulations    PT Next Visit Plan DN and manual work to Rt hip and low back, core stability    PT Home Exercise Plan Access Code: [email or web address]    Consulted and Agree with Plan of Care Patient           Patient will benefit from skilled therapeutic intervention in order to improve the following deficits and impairments:  Decreased range of motion, Obesity, Increased muscle spasms, Pain, Impaired flexibility, Decreased strength  Visit Diagnosis: Muscle weakness (generalized) - Plan: PT plan of care cert/re-cert  Chronic right-sided low back pain with right-sided sciatica - Plan: PT plan of care cert/re-cert  Other muscle spasm - Plan: PT plan of care cert/re-cert     Problem List Patient Active Problem List   Diagnosis Date Noted  . Vitamin D deficiency 04/08/2020  . Hyperglycemia 03/12/2020  . Essential hypertension 03/12/2020  . Other hyperlipidemia 01/14/2020  .  Lower extremity edema 01/14/2020  . Depression 01/14/2020  . Class 2 severe obesity with serious comorbidity and body mass index (BMI) of 36.0 to 36.9 in adult [facility]) 01/14/2020  . Primary osteoarthritis of left knee 08/14/2018  . Primary localized osteoarthritis of right knee 05/03/2016  . Primary osteoarthritis of right knee 05/03/2016  . Abnormal auditory perception of both ears 12/01/2015   Jeral Pinch PT 04/22/2020, 12:54 PM  [facility] [place] [place], [place], [address] Phone: [phone]   Fax:  [phone]  Name: Robin Christensen MRN: [id number] Date of Birth: Aug 17, 1950

## 2020-04-23 ENCOUNTER — Ambulatory Visit (INDEPENDENT_AMBULATORY_CARE_PROVIDER_SITE_OTHER): Payer: Medicare HMO | Admitting: Adult Health

## 2020-04-23 ENCOUNTER — Encounter (INDEPENDENT_AMBULATORY_CARE_PROVIDER_SITE_OTHER): Payer: Self-pay | Admitting: Adult Health

## 2020-04-23 VITALS — BP 113/70 | HR 53 | Temp 98.3°F | Ht 68.0 in | Wt 234.0 lb

## 2020-04-23 DIAGNOSIS — Z6835 Body mass index (BMI) 35.0-35.9, adult: Secondary | ICD-10-CM

## 2020-04-23 DIAGNOSIS — I4891 Unspecified atrial fibrillation: Secondary | ICD-10-CM

## 2020-04-23 DIAGNOSIS — F3289 Other specified depressive episodes: Secondary | ICD-10-CM

## 2020-04-23 MED ORDER — BUPROPION HCL ER (SR) 150 MG PO TB12: 150.0000 mg | ORAL_TABLET | Freq: Every day | ORAL | 0 refills | Status: DC

## 2020-04-23 NOTE — Progress Notes (Signed)
Chief Complaint:   Robin Christensen is here to discuss her progress with her Robin treatment plan along with follow-up of her Robin related diagnoses. Robin Christensen is on the Category 3 Plan and states she is following her eating plan approximately 90% of the time. Robin Christensen states she is walking 45-60 minutes 4-5 times per week.  Today's visit was #: 67 Starting weight: 296 lbs Starting date: 07/25/2019 Today's weight: 234 lbs Today's date: 04/23/2020 Total lbs lost to date: 62 Total lbs lost since last in-office visit: 6  Interim History: After her last weigh-in, Robin Christensen celebrated with pizza and candy that made her physically ill after ingesting. She was surprised that eating off plan affected her so negatively. She has started PT for chronic lumbar back pain and will have a total of 8 visits, has had one visit so far. She is down a total of 62 lbs since starting the program in January 2021.  Subjective:   Atrial fibrillation, unspecified type ([name]). Robin Christensen is on Eliquis 5 mg BID and denies unusual bleeding or bruising. She is now managed by her PCP. She denies cardiac symptoms.  Other depression, with emotional eating. Robin Christensen is struggling with emotional eating and using food for comfort to the extent that it is negatively impacting her health. She has been working on behavior modification techniques to help reduce her emotional eating and has been somewhat successful. She shows no sign of suicidal or homicidal ideations. Blood pressure and heart rate are excellent at today's office visit. Robin Christensen denies history of seizures. She reports excellent control of cravings since starting bupropion SR 150 mg daily.  Assessment/Plan:   Atrial fibrillation, unspecified type ([name]). Robin Christensen will continue Eliquis as directed.   Other depression, with emotional eating. Behavior modification techniques were discussed today to help Robin Christensen deal with her emotional/non-hunger eating  behaviors.  Orders and follow up as documented in patient record. Refill was given for buPROPion (WELLBUTRIN SR) 150 MG 12 hr tablet daily #30 with 0 refills.  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  Robin Christensen is currently in the action stage of change. As such, her goal is to continue with weight loss efforts. She has agreed to the Category 3 Plan.   Exercise goals: Robin Christensen will continue walking 45-60 minutes 4-5 times per week.  Behavioral modification strategies: increasing lean protein intake, decreasing simple carbohydrates, meal planning and cooking strategies, better snacking choices and planning for success.  Robin Christensen has agreed to follow-up with our clinic in 3 weeks. She was informed of the importance of frequent follow-up visits to maximize her success with intensive lifestyle modifications for her multiple health conditions.   Objective:   Blood pressure 113/70, pulse (!) 53, temperature 98.3 F (36.8 C), height 5\' 8"  (1.727 m), weight 234 lb (106.1 kg), SpO2 98 %. Body mass index is 35.58 kg/m.  General: Cooperative, alert, well developed, in no acute distress. HEENT: Conjunctivae and lids unremarkable. Cardiovascular: Regular rhythm.  Lungs: Normal work of breathing. Neurologic: No focal deficits.   Lab Results  Component Value Date   CREATININE 0.89 03/12/2020   BUN 16 03/12/2020   NA 142 03/12/2020   K 4.1 03/12/2020   CL 103 03/12/2020   CO2 28 03/12/2020   Lab Results  Component Value Date   ALT 15 03/12/2020   AST 19 03/12/2020   ALKPHOS 109 03/12/2020   BILITOT 0.4 03/12/2020  Lab Results  Component Value Date   HGBA1C 5.4 03/12/2020   HGBA1C 5.6 11/14/2019   HGBA1C 5.4 07/25/2019   Lab Results  Component Value Date   INSULIN 6.9 03/12/2020   INSULIN 6.5 11/14/2019   INSULIN 10.3 07/25/2019   Lab Results  Component Value Date   TSH 1.810 11/14/2019     Lab Results  Component Value Date   CHOL 160 03/12/2020   HDL 61 03/12/2020   LDLCALC 84 03/12/2020   TRIG 80 03/12/2020   CHOLHDL 2.6 03/12/2020   Lab Results  Component Value Date   WBC 6.4 07/25/2019   HGB 14.9 07/25/2019   HCT 46.7 (H) 07/25/2019   MCV 89 07/25/2019   PLT 257 07/25/2019   Lab Results  Component Value Date   IRON 97 07/25/2019   TIBC 374 07/25/2019   FERRITIN 91 07/25/2019   Robin Behavioral Intervention:   Approximately 15 minutes were spent on the discussion below.  ASK: We discussed the diagnosis of Robin with Robin Christensen today and Robin Christensen agreed to give [name] permission to discuss Robin behavioral modification therapy today.  ASSESS: Robin Christensen has the diagnosis of Robin and her BMI today is 35.6. Adin is in the action stage of change.   ADVISE: Robin Christensen was educated on the multiple health risks of Robin as well as the benefit of weight loss to improve her health. She was advised of the need for long term treatment and the importance of lifestyle modifications to improve her current health and to decrease her risk of future health problems.  AGREE: Multiple dietary modification options and treatment options were discussed and Robin Christensen agreed to follow the recommendations documented in the above note.  ARRANGE: Robin Christensen was educated on the importance of frequent visits to treat Robin as outlined per CMS and USPSTF guidelines and agreed to schedule her next follow up appointment today.  Attestation Statements:   Reviewed by clinician on day of visit: allergies, medications, problem list, medical history, surgical history, family history, social history, and previous encounter notes.  I, Michaelene Song, am acting as Location manager for [employer], NP-C   I have reviewed the above documentation for accuracy and completeness, and I agree with the above. -  Renn Stille d. Tysean Vandervliet NP-C

## 2020-04-28 DIAGNOSIS — M5136 Other intervertebral disc degeneration, lumbar region: Secondary | ICD-10-CM | POA: Diagnosis not present

## 2020-04-29 ENCOUNTER — Other Ambulatory Visit: Payer: Self-pay

## 2020-04-29 ENCOUNTER — Encounter: Payer: Self-pay | Admitting: Physical Therapy

## 2020-04-29 ENCOUNTER — Ambulatory Visit: Payer: Medicare HMO | Admitting: Physical Therapy

## 2020-04-29 DIAGNOSIS — M5441 Lumbago with sciatica, right side: Secondary | ICD-10-CM | POA: Diagnosis not present

## 2020-04-29 DIAGNOSIS — M62838 Other muscle spasm: Secondary | ICD-10-CM

## 2020-04-29 DIAGNOSIS — G8929 Other chronic pain: Secondary | ICD-10-CM

## 2020-04-29 DIAGNOSIS — M6281 Muscle weakness (generalized): Secondary | ICD-10-CM | POA: Diagnosis not present

## 2020-04-29 NOTE — Therapy (Signed)
[place], [place], [address] Phone: [phone]   Fax:  [phone]  Physical Therapy Treatment  Patient Details  Name: Robin Christensen MRN: [id number] Date of Birth: 09-Oct-1950 Referring Provider (PT): Dr London Pepper   Encounter Date: 04/29/2020   PT End of Session - 04/29/20 0932    Visit Number 2    Number of Visits 8    Date for PT Re-Evaluation 05/20/20    Authorization Type [employer] MCR    Authorization - Visit Number 2    Authorization - Number of Visits 8    PT Start Time 0933    PT Stop Time 1017    PT Time Calculation (min) 44 min    Activity Tolerance Patient limited by pain    Behavior During Therapy [facility] for tasks assessed/performed           Past Medical History:  Diagnosis Date   A-fib ([place])    B12 deficiency    Back pain    Breast cancer ([place])    Cancer ([place])    Right Breast Cancer   Chronic headaches    Dysrhythmia    AF   Edema, lower extremity    Gallbladder disease    GERD (gastroesophageal reflux disease)    Hyperlipidemia    Hypertension    Hypothyroidism    Joint pain    Obesity    Pneumonia    PONV (postoperative nausea and vomiting)    Sleep apnea    cpcap   Vitamin deficiency     Past Surgical History:  Procedure Laterality Date   BREAST SURGERY     CARDIOVERSION N/A 06/26/2014   Procedure: CARDIOVERSION;  Surgeon: Laverda Page, MD;  Location: [place] [facility];  Service: Cardiovascular;  Laterality: N/A;   CATARACT EXTRACTION     CHOLECYSTECTOMY     DILATION AND CURETTAGE OF UTERUS     eyelid surgery     TOTAL KNEE ARTHROPLASTY Right 05/03/2016   Procedure: TOTAL KNEE ARTHROPLASTY;  Surgeon: Melrose Nakayama, MD;  Location: [place];  Service: Orthopedics;  Laterality: Right;   TOTAL KNEE ARTHROPLASTY Left 08/14/2018   Procedure: TOTAL KNEE ARTHROPLASTY;  Surgeon: Melrose Nakayama, MD;  Location: [place];  Service:  Orthopedics;  Laterality: Left;    There were no vitals filed for this visit.   Subjective Assessment - 04/29/20 0933    Subjective Pat reports that she is really liking using the ball for self trigger point release and is doing it on both sides.  Had her spinal injection yesterday and returns to see him in a month to see if it helps. Thinks she may feel a little better from that .    Patient Stated Goals get 50-60% better with less pain    Currently in Pain? Yes    Pain Score 4     Pain Location Buttocks    Pain Orientation Right    Pain Descriptors / Indicators Aching;Dull    Pain Type Chronic pain    Pain Onset More than a month ago    Pain Frequency Constant    Aggravating Factors  sleeping less than 2 hrs    Pain Relieving Factors self trigger point release                             [facility] Adult PT Treatment/Exercise - 04/29/20 0001      Exercises   Exercises  Lumbar      Lumbar Exercises: Stretches   Passive Hamstring Stretch Left;Right;30 seconds    Single Knee to Chest Stretch Left;Right;30 seconds    Figure 4 Stretch 10 seconds;With overpressure      Lumbar Exercises: Supine   Ab Set 5 reps;5 seconds    Clam 10 reps   each side with TA engagement   Bridge 10 reps      Modalities   Modalities Electrical Stimulation;Moist Heat      Moist Heat Therapy   Number Minutes Moist Heat 10 Minutes    Moist Heat Location --   bilat buttocks,  pt can not find her home TENs at this time     Acupuncturist Stimulation Location bilat buttocks    Electrical Stimulation Action IFC     Electrical Stimulation Parameters in supine to tolerance    Electrical Stimulation Goals Pain;Tone      Manual Therapy   Manual Therapy Soft tissue mobilization    Manual therapy comments skilled palpation and monitoring during drn needling    Soft tissue mobilization STM to bilat gluts, piriformis and QLs              Trigger Point Dry Needling -  04/29/20 0001    Consent Given? Yes    Education Handout Provided Previously provided    Muscles Treated Back/Hip Gluteus medius;Gluteus maximus;Piriformis    Gluteus Medius Response Palpable increased muscle length;Twitch response elicited   bilat   Gluteus Maximus Response Palpable increased muscle length;Twitch response elicited   bilat   Piriformis Response Palpable increased muscle length;Twitch response elicited   bilat                    PT Long Term Goals - 04/22/20 1247      PT LONG TERM GOAL #1   Title independent with HEP for core strengthening ( 05/20/2020)    Time 4    Period Weeks    Status New    Target Date 05/20/20      PT LONG TERM GOAL #2   Title FOTO score improved to </= 42% limited for improved function  ( 05/20/2020)    Time 4    Period Weeks    Status New    Target Date 05/20/20      PT LONG TERM GOAL #3   Title report pain < 2/10 for improved function and mobility ( 05/20/2020)    Time 4    Period Weeks    Status New    Target Date 05/20/20      PT LONG TERM GOAL #4   Title tolerate > 1 hr driviing without increase in Rt sided low back pain ( 05/20/2020)    Time 4    Period Weeks    Status New    Target Date 05/20/20      PT LONG TERM GOAL #5   Title increase Rt hip and low back strength to assist with decreasing pain with sleeping    Time 4    Period Weeks    Status New    Target Date 05/20/20                 Plan - 04/29/20 1010    Clinical Impression Statement This is Robin Christensen first visit after eval. No goals met at this time.  She does report getting relief using the ball against the wall.  Has started using it on both gluts as she  is having some pain on the left at times.  She had her injection yesterday and is going to follow up with MD sooner and come up with another plan if she doesn't get relief.  WPuld bendfit from more core stabiity , manual work and TPR    Rehab Potential Good    PT Frequency 2x / week    PT Duration  4 weeks    PT Treatment/Interventions Iontophoresis 34m/ml Dexamethasone;Taping;Patient/family education;Functional mobility training;Moist Heat;Traction;Therapeutic activities;Passive range of motion;Therapeutic exercise;Cryotherapy;Electrical Stimulation;Manual techniques;Dry needling;Spinal Manipulations    PT Next Visit Plan try foam roller for self TPR, core work and manual .  Modaltities PRN    Consulted and Agree with Plan of Care Patient           Patient will benefit from skilled therapeutic intervention in order to improve the following deficits and impairments:  Decreased range of motion, Obesity, Increased muscle spasms, Pain, Impaired flexibility, Decreased strength  Visit Diagnosis: Muscle weakness (generalized)  Other muscle spasm  Chronic right-sided low back pain with right-sided sciatica     Problem List Patient Active Problem List   Diagnosis Date Noted   Atrial fibrillation ([place] 04/23/2020   Vitamin D deficiency 04/08/2020   Hyperglycemia 03/12/2020   Essential hypertension 03/12/2020   Other hyperlipidemia 01/14/2020   Lower extremity edema 01/14/2020   Depression 01/14/2020   Class 2 severe obesity with serious comorbidity and body mass index (BMI) of 35.0 to 35.9 in adult ([facility] 01/14/2020   Primary osteoarthritis of left knee 08/14/2018   Primary localized osteoarthritis of right knee 05/03/2016   Primary osteoarthritis of right knee 05/03/2016   Abnormal auditory perception of both ears 12/01/2015    SJeral PinchPT  04/29/2020, 10:12 AM  [facility] [address]Phone: [phone]  Fax:  [phone] Name: Robin BERRESMRN: [id number] of Birth: [birth year]

## 2020-05-01 ENCOUNTER — Other Ambulatory Visit: Payer: Self-pay

## 2020-05-01 ENCOUNTER — Ambulatory Visit: Payer: Medicare HMO | Admitting: Physical Therapy

## 2020-05-01 ENCOUNTER — Encounter: Payer: Self-pay | Admitting: Physical Therapy

## 2020-05-01 DIAGNOSIS — G8929 Other chronic pain: Secondary | ICD-10-CM

## 2020-05-01 DIAGNOSIS — M6281 Muscle weakness (generalized): Secondary | ICD-10-CM | POA: Diagnosis not present

## 2020-05-01 DIAGNOSIS — M62838 Other muscle spasm: Secondary | ICD-10-CM | POA: Diagnosis not present

## 2020-05-01 DIAGNOSIS — M5441 Lumbago with sciatica, right side: Secondary | ICD-10-CM | POA: Diagnosis not present

## 2020-05-01 NOTE — Therapy (Signed)
[place], [place], [address] Phone: [phone]   Fax:  [phone]  Physical Therapy Treatment  Patient Details  Name: Robin Christensen MRN: [id number] Date of Birth: [birth year] Referring Provider (PT): Dr London Pepper   Encounter Date: 05/01/2020   PT End of Session - 05/01/20 0854    Visit Number 3    Number of Visits 8    Date for PT Re-Evaluation 05/20/20    Authorization Type [employer] MCR    Authorization - Visit Number 3    Authorization - Number of Visits 8    PT Start Time 0848    PT Stop Time 0936    PT Time Calculation (min) 48 min    Activity Tolerance Patient tolerated treatment well    Behavior During Therapy [facility] for tasks assessed/performed           Past Medical History:  Diagnosis Date  . A-fib ([place])   . B12 deficiency   . Back pain   . Breast cancer ([place])   . Cancer [facility])    Right Breast Cancer  . Chronic headaches   . Dysrhythmia    AF  . Edema, lower extremity   . Gallbladder disease   . GERD (gastroesophageal reflux disease)   . Hyperlipidemia   . Hypertension   . Hypothyroidism   . Joint pain   . Obesity   . Pneumonia   . PONV (postoperative nausea and vomiting)   . Sleep apnea    cpcap  . Vitamin deficiency     Past Surgical History:  Procedure Laterality Date  . BREAST SURGERY    . CARDIOVERSION N/A 06/26/2014   Procedure: CARDIOVERSION;  Surgeon: Laverda Page, MD;  Location: [place];  Service: Cardiovascular;  Laterality: N/A;  . CATARACT EXTRACTION    . CHOLECYSTECTOMY    . DILATION AND CURETTAGE OF UTERUS    . eyelid surgery    . TOTAL KNEE ARTHROPLASTY Right 05/03/2016   Procedure: TOTAL KNEE ARTHROPLASTY;  Surgeon: Melrose Nakayama, MD;  Location: [place];  Service: Orthopedics;  Laterality: Right;  . TOTAL KNEE ARTHROPLASTY Left 08/14/2018   Procedure: TOTAL KNEE ARTHROPLASTY;  Surgeon: Melrose Nakayama, MD;  Location: [place];   Service: Orthopedics;  Laterality: Left;    There were no vitals filed for this visit.   Subjective Assessment - 05/01/20 0851    Subjective Pt reports she is sleeping a bit better, "I can sleep for 4-5 hrs now".  She has been doing her exercises.  She states the DN helped.  "But I'm back to feeling like I'm sitting on a rock".    Currently in Pain? Yes    Pain Score 3     Pain Location Buttocks    Pain Orientation Right    Pain Descriptors / Indicators Aching    Aggravating Factors  sitting    Pain Relieving Factors self TP release with ball.              [facility] PT Assessment - 05/01/20 0001      Assessment   Medical Diagnosis Sciatic pain     Referring Provider (PT) Dr London Pepper    Onset Date/Surgical Date 04/23/19    Hand Dominance Right    Next MD Visit PRN    Prior Therapy yes      AROM   Right/Left Hip Left;Right    Right Hip External Rotation  60    Right Hip Internal  Rotation  37    Left Hip External Rotation  60    Left Hip Internal Rotation  30      Flexibility   Soft Tissue Assessment /Muscle Length yes    Quadriceps Rt 103 deg, Lt 99 deg             [facility] Adult PT Treatment/Exercise - 05/01/20 0001      Self-Care   Self-Care Other Self-Care Comments    Other Self-Care Comments  reviewed self MFR with ball to Rt hip flexor; pt returned demo with cues. Pt reminded to using rolling pin for Lt quad tightness.       Lumbar Exercises: Stretches   Passive Hamstring Stretch Right;Left;2 reps;30 seconds    Quad Stretch Right;Left;2 reps;30 seconds   prone with strap   ITB Stretch Right;Left;2 reps;30 seconds   supine with strap     Lumbar Exercises: Aerobic   Tread Mill 2.0 x 5 min for warm up.       Lumbar Exercises: Supine   Clam 10 reps;2 seconds   green band around thighs    Clam Limitations increased pain with RLE into ER.     Bridge 5 reps;3 seconds   2 sets, ball squeeze    Other Supine Lumbar Exercises MET correction with dowel under Rt knee,  over Lt knee - 5 sec contraction x 5 reps - followed by bridge with ball squeeze (for assistance with realignment of pelvis.       Lumbar Exercises: Prone   Other Prone Lumbar Exercises trial of 1 rep of each hip ext knee straight, knee bent each leg.  Pt had increased Rt sacral pain with Lt hip ext. stopped.        Moist Heat Therapy   Number Minutes Moist Heat 10 Minutes    Moist Heat Location Hip   Rt post buttocks/LB     Electrical Stimulation   Electrical Stimulation Location Rt buttocks     Electrical Stimulation Action IFC    Electrical Stimulation Parameters pt in prone, 10 min, intensity to tolerance     Electrical Stimulation Goals Pain      Manual Therapy   Manual therapy comments MET to LLE (pt in prone) for Lt sacral torsion.     Soft tissue mobilization gentle rocking and shifting Lt buttocks forward and Rt hip backward with pt in prone to assist with correction of Lt sacral torsion.                   PT Education - 05/01/20 1051    Education Details HEP - verbally added standing hip abdct, self massage with roller stick, and self release of Rt psoas with ball. Encouraged pt to stretch both LEs, possibly adding in quad stretch.    Person(s) Educated Patient    Methods Explanation    Comprehension Verbalized understanding               PT Long Term Goals - 04/22/20 1247      PT LONG TERM GOAL #1   Title independent with HEP for core strengthening ( 05/20/2020)    Time 4    Period Weeks    Status New    Target Date 05/20/20      PT LONG TERM GOAL #2   Title FOTO score improved to </= 42% limited for improved function  ( 05/20/2020)    Time 4    Period Weeks    Status New  Target Date 05/20/20      PT LONG TERM GOAL #3   Title report pain < 2/10 for improved function and mobility ( 05/20/2020)    Time 4    Period Weeks    Status New    Target Date 05/20/20      PT LONG TERM GOAL #4   Title tolerate > 1 hr driviing without increase in Rt  sided low back pain ( 05/20/2020)    Time 4    Period Weeks    Status New    Target Date 05/20/20      PT LONG TERM GOAL #5   Title increase Rt hip and low back strength to assist with decreasing pain with sleeping    Time 4    Period Weeks    Status New    Target Date 05/20/20                 Plan - 05/01/20 0854    Clinical Impression Statement Positive response to DN and injection.  She presents with slight ant rotation of Rt inominate; improved with MET correction in supine.  Improved tolerance for bridge with cues, ball squeeze and repetition.  Pt reported slight reduction of pain in Rt buttocks at end of session.    Rehab Potential Good    PT Frequency 2x / week    PT Duration 4 weeks    PT Treatment/Interventions Iontophoresis 75m/ml Dexamethasone;Taping;Patient/family education;Functional mobility training;Moist Heat;Traction;Therapeutic activities;Passive range of motion;Therapeutic exercise;Cryotherapy;Electrical Stimulation;Manual techniques;Dry needling;Spinal Manipulations    PT Next Visit Plan core work and manual .  Modaltities PRN. Progress HEP and assess pelvis symmetry.    PT Home Exercise Plan Access Code: [email or web address]    JSRPRXYVOand Agree with Plan of Care Patient           Patient will benefit from skilled therapeutic intervention in order to improve the following deficits and impairments:  Decreased range of motion, Obesity, Increased muscle spasms, Pain, Impaired flexibility, Decreased strength  Visit Diagnosis: Muscle weakness (generalized)  Other muscle spasm  Chronic right-sided low back pain with right-sided sciatica     Problem List Patient Active Problem List   Diagnosis Date Noted  . Atrial fibrillation ([place] 04/23/2020  . Vitamin D deficiency 04/08/2020  . Hyperglycemia 03/12/2020  . Essential hypertension 03/12/2020  . Other hyperlipidemia 01/14/2020  . Lower extremity edema 01/14/2020  . Depression 01/14/2020  . Class 2 severe  obesity with serious comorbidity and body mass index (BMI) of 35.0 to 35.9 in adult ([facility] 01/14/2020  . Primary osteoarthritis of left knee 08/14/2018  . Primary localized osteoarthritis of right knee 05/03/2016  . Primary osteoarthritis of right knee 05/03/2016  . Abnormal auditory perception of both ears 12/01/2015   JKerin Perna PTA 05/01/20 10:56 AM  [facility] [address]Phone: [phone]  Fax:  [phone] Name: Robin DOMBEKMRN: [id number] of Birth: [birth year]

## 2020-05-06 ENCOUNTER — Other Ambulatory Visit: Payer: Self-pay

## 2020-05-06 ENCOUNTER — Ambulatory Visit: Payer: Medicare HMO | Admitting: Physical Therapy

## 2020-05-06 DIAGNOSIS — M5441 Lumbago with sciatica, right side: Secondary | ICD-10-CM | POA: Diagnosis not present

## 2020-05-06 DIAGNOSIS — M62838 Other muscle spasm: Secondary | ICD-10-CM

## 2020-05-06 DIAGNOSIS — G8929 Other chronic pain: Secondary | ICD-10-CM | POA: Diagnosis not present

## 2020-05-06 DIAGNOSIS — M6281 Muscle weakness (generalized): Secondary | ICD-10-CM | POA: Diagnosis not present

## 2020-05-06 NOTE — Therapy (Signed)
[place], [place], [address] Phone: [phone]   Fax:  [phone]  Physical Therapy Treatment  Patient Details  Name: Robin Christensen MRN: [id number] Date of Birth: Jan 16, 1951 Referring Provider (PT): Dr London Pepper   Encounter Date: 05/06/2020   PT End of Session - 05/06/20 0943    Visit Number 4    Number of Visits 8    Date for PT Re-Evaluation 05/20/20    Authorization Type [employer] MCR    Authorization - Visit Number 4    Authorization - Number of Visits 8    PT Start Time 0935    PT Stop Time 1025    PT Time Calculation (min) 50 min    Activity Tolerance Patient tolerated treatment well    Behavior During Therapy [facility] for tasks assessed/performed           Past Medical History:  Diagnosis Date  . A-fib ([place])   . B12 deficiency   . Back pain   . Breast cancer ([place])   . Cancer [facility])    Right Breast Cancer  . Chronic headaches   . Dysrhythmia    AF  . Edema, lower extremity   . Gallbladder disease   . GERD (gastroesophageal reflux disease)   . Hyperlipidemia   . Hypertension   . Hypothyroidism   . Joint pain   . Obesity   . Pneumonia   . PONV (postoperative nausea and vomiting)   . Sleep apnea    cpcap  . Vitamin deficiency     Past Surgical History:  Procedure Laterality Date  . BREAST SURGERY    . CARDIOVERSION N/A 06/26/2014   Procedure: CARDIOVERSION;  Surgeon: Laverda Page, MD;  Location: [place];  Service: Cardiovascular;  Laterality: N/A;  . CATARACT EXTRACTION    . CHOLECYSTECTOMY    . DILATION AND CURETTAGE OF UTERUS    . eyelid surgery    . TOTAL KNEE ARTHROPLASTY Right 05/03/2016   Procedure: TOTAL KNEE ARTHROPLASTY;  Surgeon: Melrose Nakayama, MD;  Location: [place];  Service: Orthopedics;  Laterality: Right;  . TOTAL KNEE ARTHROPLASTY Left 08/14/2018   Procedure: TOTAL KNEE ARTHROPLASTY;  Surgeon: Melrose Nakayama, MD;  Location: [place];   Service: Orthopedics;  Laterality: Left;    There were no vitals filed for this visit.   Subjective Assessment - 05/06/20 0939    Subjective Pt reports she had been feeling better since last visit until yesterday when she stood up from recliner, her glute "gripped" and had increased pain.  Her pain was a 1/10 prior to yesterday, "absolutely liveable".    Patient Stated Goals get 50-60% better with less pain    Currently in Pain? Yes    Pain Score 4     Pain Location Buttocks    Pain Orientation Right    Pain Descriptors / Indicators Aching    Aggravating Factors  single leg exercises on RLE    Pain Relieving Factors self release with ball.              [facility] PT Assessment - 05/06/20 0001      Assessment   Medical Diagnosis Sciatic pain     Referring Provider (PT) Dr London Pepper    Onset Date/Surgical Date 04/23/19    Hand Dominance Right    Next MD Visit PRN    Prior Therapy yes      Strength   Right Hip ABduction 4-/5  BP at seated rest break: 117/72, HR 55   [facility] Adult PT Treatment/Exercise - 05/06/20 0001      Lumbar Exercises: Stretches   Passive Hamstring Stretch Right;Left;2 reps;30 seconds    Passive Hamstring Stretch Limitations some increase in localized pain in Rt buttock    Quad Stretch Right;Left;2 reps;30 seconds   prone with strap   ITB Stretch Right;Left;2 reps;30 seconds   supine with strap     Lumbar Exercises: Aerobic   Tread Mill 2.0 x 5 min for warm up.       Lumbar Exercises: Standing   Functional Squats 10 reps;3 seconds   cues for even wt, needs to shift more to L.    Other Standing Lumbar Exercises Rt hip ext x 10 reps, 5 reps; L hip ext x 5 reps;  Lt/Rt hip abdct x 10 each       Lumbar Exercises: Supine   Bridge 5 reps;3 seconds      Lumbar Exercises: Sidelying   Hip Abduction Right;5 reps   2 sets; fatigues quickly     Lumbar Exercises: Prone   Other Prone Lumbar Exercises self release with ball to Rt ant hip x 2 min       Moist Heat Therapy   Number Minutes Moist Heat 10 Minutes      Electrical Stimulation   Electrical Stimulation Location Rt buttocks     Electrical Stimulation Action IFC    Electrical Stimulation Parameters pt in prone, 10 in intensity to tolerance     Electrical Stimulation Goals Pain                       PT Long Term Goals - 04/22/20 1247      PT LONG TERM GOAL #1   Title independent with HEP for core strengthening ( 05/20/2020)    Time 4    Period Weeks    Status New    Target Date 05/20/20      PT LONG TERM GOAL #2   Title FOTO score improved to </= 42% limited for improved function  ( 05/20/2020)    Time 4    Period Weeks    Status New    Target Date 05/20/20      PT LONG TERM GOAL #3   Title report pain < 2/10 for improved function and mobility ( 05/20/2020)    Time 4    Period Weeks    Status New    Target Date 05/20/20      PT LONG TERM GOAL #4   Title tolerate > 1 hr driviing without increase in Rt sided low back pain ( 05/20/2020)    Time 4    Period Weeks    Status New    Target Date 05/20/20      PT LONG TERM GOAL #5   Title increase Rt hip and low back strength to assist with decreasing pain with sleeping    Time 4    Period Weeks    Status New    Target Date 05/20/20                 Plan - 05/06/20 0958    Clinical Impression Statement Pt's Rt posterior hip elevated when pt goes to prone on elbows, but pelvis is netural when fully prone. Pelvis appears aligned otherwise.  Pt's Rt hip abdct strength slightly improved. Decreased tolerance for Rt SLS and Lt hip abdct; changed to sidelying hip abdct for HEP.  Pt had short episode of dizziness when walking around in between exercises; resolved with seated rest. Pt mentioned she had not had anything to eat or drink this morning prior to session.  BP was WNL. Progressing towards goals.    Rehab Potential Good    PT Frequency 2x / week    PT Duration 4 weeks    PT Treatment/Interventions  Iontophoresis 4mg /ml Dexamethasone;Taping;Patient/family education;Functional mobility training;Moist Heat;Traction;Therapeutic activities;Passive range of motion;Therapeutic exercise;Cryotherapy;Electrical Stimulation;Manual techniques;Dry needling;Spinal Manipulations    PT Next Visit Plan core work and manual .  Modaltities PRN. Progress HEP and assess pelvis symmetry.    PT Home Exercise Plan Access Code: [email or web address]    [email or web address] and Agree with Plan of Care Patient           Patient will benefit from skilled therapeutic intervention in order to improve the following deficits and impairments:  Decreased range of motion, Obesity, Increased muscle spasms, Pain, Impaired flexibility, Decreased strength  Visit Diagnosis: Muscle weakness (generalized)  Other muscle spasm  Chronic right-sided low back pain with right-sided sciatica     Problem List Patient Active Problem List   Diagnosis Date Noted  . Atrial fibrillation ([place]) 04/23/2020  . Vitamin D deficiency 04/08/2020  . Hyperglycemia 03/12/2020  . Essential hypertension 03/12/2020  . Other hyperlipidemia 01/14/2020  . Lower extremity edema 01/14/2020  . Depression 01/14/2020  . Class 2 severe obesity with serious comorbidity and body mass index (BMI) of 35.0 to 35.9 in adult [facility]) 01/14/2020  . Primary osteoarthritis of left knee 08/14/2018  . Primary localized osteoarthritis of right knee 05/03/2016  . Primary osteoarthritis of right knee 05/03/2016  . Abnormal auditory perception of both ears 12/01/2015   Kerin Perna, PTA 05/06/20 10:39 AM  [place], [place], [address] Phone: [phone]   Fax:  [phone]  Name: Robin Christensen MRN: [id number] Date of Birth: 05/09/1951

## 2020-05-08 ENCOUNTER — Other Ambulatory Visit: Payer: Self-pay

## 2020-05-08 ENCOUNTER — Ambulatory Visit: Payer: Medicare HMO | Admitting: Physical Therapy

## 2020-05-08 DIAGNOSIS — M5441 Lumbago with sciatica, right side: Secondary | ICD-10-CM | POA: Diagnosis not present

## 2020-05-08 DIAGNOSIS — M62838 Other muscle spasm: Secondary | ICD-10-CM

## 2020-05-08 DIAGNOSIS — G8929 Other chronic pain: Secondary | ICD-10-CM | POA: Diagnosis not present

## 2020-05-08 DIAGNOSIS — M6281 Muscle weakness (generalized): Secondary | ICD-10-CM

## 2020-05-08 NOTE — Therapy (Signed)
[place], [place], [address] Phone: [phone]   Fax:  [phone]  Physical Therapy Treatment  Patient Details  Name: Robin Christensen MRN: [id number] Date of Birth: 1950-12-31 Referring Provider (PT): Dr London Pepper   Encounter Date: 05/08/2020   PT End of Session - 05/08/20 1030    Visit Number 5    Number of Visits 8    Date for PT Re-Evaluation 05/20/20    Authorization Type [employer] MCR    Authorization - Visit Number 5    Authorization - Number of Visits 8    PT Start Time [phone]    PT Stop Time 1024    PT Time Calculation (min) 50 min    Activity Tolerance Patient tolerated treatment well    Behavior During Therapy [facility] for tasks assessed/performed           Past Medical History:  Diagnosis Date  . A-fib ([place])   . B12 deficiency   . Back pain   . Breast cancer ([place])   . Cancer [facility]    Right Breast Cancer  . Chronic headaches   . Dysrhythmia    AF  . Edema, lower extremity   . Gallbladder disease   . GERD (gastroesophageal reflux disease)   . Hyperlipidemia   . Hypertension   . Hypothyroidism   . Joint pain   . Obesity   . Pneumonia   . PONV (postoperative nausea and vomiting)   . Sleep apnea    cpcap  . Vitamin deficiency     Past Surgical History:  Procedure Laterality Date  . BREAST SURGERY    . CARDIOVERSION N/A 06/26/2014   Procedure: CARDIOVERSION;  Surgeon: Laverda Page, MD;  Location: [place];  Service: Cardiovascular;  Laterality: N/A;  . CATARACT EXTRACTION    . CHOLECYSTECTOMY    . DILATION AND CURETTAGE OF UTERUS    . eyelid surgery    . TOTAL KNEE ARTHROPLASTY Right 05/03/2016   Procedure: TOTAL KNEE ARTHROPLASTY;  Surgeon: Melrose Nakayama, MD;  Location: [place];  Service: Orthopedics;  Laterality: Right;  . TOTAL KNEE ARTHROPLASTY Left 08/14/2018   Procedure: TOTAL KNEE ARTHROPLASTY;  Surgeon: Melrose Nakayama, MD;  Location: [place];   Service: Orthopedics;  Laterality: Left;    There were no vitals filed for this visit.   Subjective Assessment - 05/08/20 0934    Subjective "I feel like an old lady".  Pt reports she has had 3 "episodes" each day where there is sharp pain in Rt buttocks with getting out of chair or bending over.    Patient Stated Goals get 50-60% better with less pain    Currently in Pain? Yes    Pain Score 8     Pain Location Buttocks    Pain Orientation Right    Pain Descriptors / Indicators Sharp    Pain Radiating Towards into bilat lower back              [facility] PT Assessment - 05/08/20 0001      Assessment   Medical Diagnosis Sciatic pain     Referring Provider (PT) Dr London Pepper    Onset Date/Surgical Date 04/23/19    Hand Dominance Right    Next MD Visit PRN    Prior Therapy yes      Palpation   SI assessment  Rt ASIS lower than Lt and point tender; sacrum level in prone  [place] Adult PT Treatment/Exercise - 05/08/20 0001      Lumbar Exercises: Stretches   Prone on Elbows Stretch 5 reps;10 seconds;20 seconds   reps spread out, during release work to Artist Right;Left;2 reps;30 seconds   prone with strap     Lumbar Exercises: Aerobic   Tread Mill 1.9 mph x 5 min after prone exercises and release work      Lumbar Exercises: Seated   Other Seated Lumbar Exercises seated on dynadisc for improved awareness of neutral pelvis     Other Seated Lumbar Exercises sit to stand with core engaged and neutral pelvis x 2 reps       Lumbar Exercises: Prone   Other Prone Lumbar Exercises self release with ball to Rt ant hip x 8 min      Moist Heat Therapy   Number Minutes Moist Heat 12 Minutes    Moist Heat Location Hip      Electrical Stimulation   Electrical Stimulation Location Rt buttocks/hip    Electrical Stimulation Action IFC    Electrical Stimulation Parameters pt in prone, 12 min     Electrical Stimulation Goals Pain      Manual Therapy   Soft  tissue mobilization STM to Rt glute, piriformis with passive IR/ER of RLE              PT Long Term Goals - 04/22/20 1247      PT LONG TERM GOAL #1   Title independent with HEP for core strengthening ( 05/20/2020)    Time 4    Period Weeks    Status New    Target Date 05/20/20      PT LONG TERM GOAL #2   Title FOTO score improved to </= 42% limited for improved function  ( 05/20/2020)    Time 4    Period Weeks    Status New    Target Date 05/20/20      PT LONG TERM GOAL #3   Title report pain < 2/10 for improved function and mobility ( 05/20/2020)    Time 4    Period Weeks    Status New    Target Date 05/20/20      PT LONG TERM GOAL #4   Title tolerate > 1 hr driviing without increase in Rt sided low back pain ( 05/20/2020)    Time 4    Period Weeks    Status New    Target Date 05/20/20      PT LONG TERM GOAL #5   Title increase Rt hip and low back strength to assist with decreasing pain with sleeping    Time 4    Period Weeks    Status New    Target Date 05/20/20                 Plan - 05/08/20 0945    Clinical Impression Statement Pt returns with flare up of symptoms.  Limited tolerance for laying in supine on black mat due to increased pain in Rt buttock and low back.  Rt inominate anteriorly rotated again and iliopsoas tender.  Improved pain level to 3-4/10 after release work with ball and prone stretches.  Pt has increased pain in buttock and into leg when sitting in a relaxed flexed position; pain reduced with neutral pelvis.  Pt given education on self care while away at beach next week.  Pt may benefit from DN to iliopsoas/ post Rt hip in future session.  Pain reported as 1-2/10 at end of session. Goals are ongoing.    Rehab Potential Good    PT Frequency 2x / week    PT Duration 4 weeks    PT Treatment/Interventions Iontophoresis 4mg /ml Dexamethasone;Taping;Patient/family education;Functional mobility training;Moist Heat;Traction;Therapeutic  activities;Passive range of motion;Therapeutic exercise;Cryotherapy;Electrical Stimulation;Manual techniques;Dry needling;Spinal Manipulations    PT Next Visit Plan core work and Dealer .  Progress HEP and assess pelvis symmetry.    PT Home Exercise Plan Access Code: [email or web address]    [email or web address] and Agree with Plan of Care Patient           Patient will benefit from skilled therapeutic intervention in order to improve the following deficits and impairments:  Decreased range of motion, Obesity, Increased muscle spasms, Pain, Impaired flexibility, Decreased strength  Visit Diagnosis: Muscle weakness (generalized)  Other muscle spasm  Chronic right-sided low back pain with right-sided sciatica     Problem List Patient Active Problem List   Diagnosis Date Noted  . Atrial fibrillation ([place]) 04/23/2020  . Vitamin D deficiency 04/08/2020  . Hyperglycemia 03/12/2020  . Essential hypertension 03/12/2020  . Other hyperlipidemia 01/14/2020  . Lower extremity edema 01/14/2020  . Depression 01/14/2020  . Class 2 severe obesity with serious comorbidity and body mass index (BMI) of 35.0 to 35.9 in adult [facility]) 01/14/2020  . Primary osteoarthritis of left knee 08/14/2018  . Primary localized osteoarthritis of right knee 05/03/2016  . Primary osteoarthritis of right knee 05/03/2016  . Abnormal auditory perception of both ears 12/01/2015   Kerin Perna, PTA 05/08/20 10:35 AM  [place], [place], [address] Phone: [phone]   Fax:  [phone]  Name: ALZADA BRAZEE MRN: [id number] Date of Birth: 11/06/1950

## 2020-05-15 ENCOUNTER — Encounter: Payer: Medicare HMO | Admitting: Physical Therapy

## 2020-05-18 DIAGNOSIS — M48061 Spinal stenosis, lumbar region without neurogenic claudication: Secondary | ICD-10-CM | POA: Diagnosis not present

## 2020-05-19 ENCOUNTER — Ambulatory Visit: Payer: Medicare HMO | Admitting: Rehabilitative and Restorative Service Providers"

## 2020-05-19 ENCOUNTER — Encounter: Payer: Self-pay | Admitting: Rehabilitative and Restorative Service Providers"

## 2020-05-19 ENCOUNTER — Other Ambulatory Visit: Payer: Self-pay

## 2020-05-19 DIAGNOSIS — Z961 Presence of intraocular lens: Secondary | ICD-10-CM | POA: Diagnosis not present

## 2020-05-19 DIAGNOSIS — H04123 Dry eye syndrome of bilateral lacrimal glands: Secondary | ICD-10-CM | POA: Diagnosis not present

## 2020-05-19 DIAGNOSIS — M6281 Muscle weakness (generalized): Secondary | ICD-10-CM | POA: Diagnosis not present

## 2020-05-19 DIAGNOSIS — M5441 Lumbago with sciatica, right side: Secondary | ICD-10-CM | POA: Diagnosis not present

## 2020-05-19 DIAGNOSIS — M62838 Other muscle spasm: Secondary | ICD-10-CM | POA: Diagnosis not present

## 2020-05-19 DIAGNOSIS — G8929 Other chronic pain: Secondary | ICD-10-CM

## 2020-05-19 DIAGNOSIS — H5212 Myopia, left eye: Secondary | ICD-10-CM | POA: Diagnosis not present

## 2020-05-19 NOTE — Therapy (Signed)
[place], [place], [address] Phone: [phone]   Fax:  [phone]  Physical Therapy Treatment and Renewal Summary  Patient Details  Name: Robin Christensen MRN: [id number] Date of Birth: 02/04/1951 Referring Provider (PT): Dr London Pepper   Encounter Date: 05/19/2020   PT End of Session - 05/19/20 0853    Visit Number 6    Number of Visits 8    Date for PT Re-Evaluation 05/20/20    Authorization Type [employer] MCR    Authorization - Visit Number 6    Authorization - Number of Visits 8    PT Start Time 9485    PT Stop Time 0930    PT Time Calculation (min) 41 min    Activity Tolerance Patient tolerated treatment well    Behavior During Therapy [facility] for tasks assessed/performed           Past Medical History:  Diagnosis Date  . A-fib ([place])   . B12 deficiency   . Back pain   . Breast cancer ([place])   . Cancer [facility])    Right Breast Cancer  . Chronic headaches   . Dysrhythmia    AF  . Edema, lower extremity   . Gallbladder disease   . GERD (gastroesophageal reflux disease)   . Hyperlipidemia   . Hypertension   . Hypothyroidism   . Joint pain   . Obesity   . Pneumonia   . PONV (postoperative nausea and vomiting)   . Sleep apnea    cpcap  . Vitamin deficiency     Past Surgical History:  Procedure Laterality Date  . BREAST SURGERY    . CARDIOVERSION N/A 06/26/2014   Procedure: CARDIOVERSION;  Surgeon: Laverda Page, MD;  Location: [place];  Service: Cardiovascular;  Laterality: N/A;  . CATARACT EXTRACTION    . CHOLECYSTECTOMY    . DILATION AND CURETTAGE OF UTERUS    . eyelid surgery    . TOTAL KNEE ARTHROPLASTY Right 05/03/2016   Procedure: TOTAL KNEE ARTHROPLASTY;  Surgeon: Melrose Nakayama, MD;  Location: [place];  Service: Orthopedics;  Laterality: Right;  . TOTAL KNEE ARTHROPLASTY Left 08/14/2018   Procedure: TOTAL KNEE ARTHROPLASTY;  Surgeon: Melrose Nakayama, MD;   Location: [place];  Service: Orthopedics;  Laterality: Left;    There were no vitals filed for this visit.   Subjective Assessment - 05/19/20 0850    Subjective The patient is doing much better overall this week.  She saw Dr. Jacelyn Grip at [facility] and he told her she would have this problem moving forward.    Patient Stated Goals get 50-60% better with less pain    Currently in Pain? Yes    Pain Score 3     Pain Location Buttocks    Pain Orientation Right    Pain Descriptors / Indicators Aching;Discomfort;Sore    Pain Type Chronic pain    Pain Onset More than a month ago    Pain Frequency Constant    Aggravating Factors  worse at night,  sitting at computer too long    Pain Relieving Factors soft tissue self mobilization              [facility] PT Assessment - 05/19/20 0856      Assessment   Medical Diagnosis Sciatic pain     Referring Provider (PT) Dr London Pepper    Onset Date/Surgical Date 04/23/19    Hand Dominance Right    Next MD Visit  PRN                         [facility] Adult PT Treatment/Exercise - 05/19/20 0856      Exercises   Exercises Lumbar      Lumbar Exercises: Stretches   Active Hamstring Stretch Right;Left;1 rep;60 seconds    Piriformis Stretch Right;Left;2 reps;30 seconds      Lumbar Exercises: Aerobic   Tread Mill 1.9 mph x 4.5 minutes for warm up      Lumbar Exercises: Standing   Heel Raises 10 reps    Heel Raises Limitations bilat LEs    Functional Squats 10 reps    Functional Squats Limitations wall slides 1/4 squat    Lifting From waist    Lifting Limitations lifting physioball to overhead with core engagement      Lumbar Exercises: Supine   Bridge 10 reps;5 seconds    Bridge Limitations with band for clamshell      Lumbar Exercises: Sidelying   Clam Right;Left;10 reps;3 seconds      Manual Therapy   Manual Therapy Soft tissue mobilization;Myofascial release    Manual therapy comments to reduce muscle guarding    Soft  tissue mobilization STM R glut, piriformis, lateral border of sacrum, and into IT band    Myofascial Release IT band            Trigger Point Dry Needling - 05/19/20 0922    Consent Given? Yes    Education Handout Provided Previously provided    Muscles Treated Lower Quadrant Vastus lateralis;Hamstring    Muscles Treated Back/Hip Gluteus medius;Gluteus maximus;Piriformis    Dry Needling Comments patient tolerated DN well    Vastus lateralis Response Twitch response elicited;Palpable increased muscle length    Hamstring Response Twitch response elicited;Palpable increased muscle length    Gluteus Medius Response Palpable increased muscle length    Gluteus Maximus Response Palpable increased muscle length    Piriformis Response Palpable increased muscle length                PT Education - 05/19/20 1304    Education Details printed updated HEP adding bridge with resistance band and clams SL.    Person(s) Educated Patient    Methods Explanation;Demonstration;Handout    Comprehension Verbalized understanding;Returned demonstration               PT Long Term Goals - 05/19/20 0855      PT LONG TERM GOAL #1   Title independent with HEP for core strengthening ( 05/20/2020)    Time 4    Period Weeks    Status Achieved      PT LONG TERM GOAL #2   Title FOTO score improved to </= 42% limited for improved function  ( 05/20/2020)    Time 4    Period Weeks    Status On-going      PT LONG TERM GOAL #3   Title report pain < 2/10 for improved function and mobility ( 05/20/2020)    Baseline Pain is 3/10 at this time.    Time 4    Period Weeks    Status Partially Met      PT LONG TERM GOAL #4   Title tolerate > 1 hr driviing without increase in Rt sided low back pain ( 05/20/2020)    Baseline Did not bother her over the weekend with travel.    Time 4    Period Weeks    Status  Achieved      PT LONG TERM GOAL #5   Title increase Rt hip and low back strength to assist with  decreasing pain with sleeping    Time 4    Period Weeks    Status On-going            UPDATED LONG TERM GOALS:  PT Long Term Goals - 05/19/20 1310      PT LONG TERM GOAL #1   Title independent with HEP for core strengthening ( 05/20/2020)    Time 4    Period Weeks    Status Revised    Target Date 06/18/20      PT LONG TERM GOAL #2   Title FOTO score improved to </= 42% limited for improved function  ( 05/20/2020)    Time 4    Period Weeks    Status Revised    Target Date 06/18/20      PT LONG TERM GOAL #3   Title report pain < 2/10 for improved function and mobility ( 05/20/2020)    Baseline Pain is 3/10 at this time.    Time 4    Period Weeks    Status Revised    Target Date 06/18/20      PT LONG TERM GOAL #4   Title The patient will be able to sit at computer x 30 minutes without increased R sided low back pain.    Time 4    Period Weeks    Status New    Target Date 06/18/20      PT LONG TERM GOAL #5   Title increase Rt hip and low back strength to assist with decreasing pain with sleeping    Baseline 3/6: Lt knee flexion 100 deg    Time 4    Period Weeks    Status Revised    Target Date 06/20/20               Plan - 05/19/20 1306    Clinical Impression Statement The patient has partially met 3 long term goals.  PT to continue working towards functional outcomes and strength goal.  She is improving with pain level and tolerance to riding in the car.  Patient continues with weakness per gait mechanics (trendelenberg, decreased heel strike).  PT to continue to work to updated goals.    Rehab Potential Good    PT Frequency 2x / week    PT Duration 4 weeks    PT Treatment/Interventions Iontophoresis 79m/ml Dexamethasone;Taping;Patient/family education;Functional mobility training;Moist Heat;Traction;Therapeutic activities;Passive range of motion;Therapeutic exercise;Cryotherapy;Electrical Stimulation;Manual techniques;Dry needling;Spinal Manipulations    PT  Next Visit Plan Strengthening LEs-- symmetrical loading for squats; progress hip abduction control, add step ups to tolerance.  STM/IASTM/DN for IT band, gluts, piriformis.    PT Home Exercise Plan Access Code: [email or web address]    [email or web address] Agree with Plan of Care Patient           Patient will benefit from skilled therapeutic intervention in order to improve the following deficits and impairments:  Decreased range of motion, Obesity, Increased muscle spasms, Pain, Impaired flexibility, Decreased strength  Visit Diagnosis: Muscle weakness (generalized)  Other muscle spasm  Chronic right-sided low back pain with right-sided sciatica     Problem List Patient Active Problem List   Diagnosis Date Noted  . Atrial fibrillation ([place] 04/23/2020  . Vitamin D deficiency 04/08/2020  . Hyperglycemia 03/12/2020  . Essential hypertension 03/12/2020  . Other hyperlipidemia 01/14/2020  . Lower extremity edema 01/14/2020  .  Depression 01/14/2020  . Class 2 severe obesity with serious comorbidity and body mass index (BMI) of 35.0 to 35.9 in adult [facility]) 01/14/2020  . Primary osteoarthritis of left knee 08/14/2018  . Primary localized osteoarthritis of right knee 05/03/2016  . Primary osteoarthritis of right knee 05/03/2016  . Abnormal auditory perception of both ears 12/01/2015    Maisyn Nouri,PT 05/19/2020, 1:10 PM  [facility] [place], [place], [address] Phone: [phone]   Fax:  [phone]  Name: Robin Christensen MRN: [id number] Date of Birth: Oct 29, 1950

## 2020-05-19 NOTE — Patient Instructions (Signed)
Access Code: [email or web address] URL: [email or web address] Date: 05/19/2020 Prepared by: Rudell Cobb  Exercises Standing Glute Med Mobilization with Wyvonnia Lora on Wall - 2 x daily - 30-60 sec hold Mini Squat - 1 x daily - 7 x weekly - 1 sets - 10 reps Supine ITB Stretch with Strap - 2 x daily - 1 reps - 30-45 sec hold Prone Quadriceps Stretch with Strap - 1 x daily - 7 x weekly - 1 sets - 2-3 reps - 30-45 seconds hold Clamshell with Resistance - 2 x daily - 7 x weekly - 1 sets - 10 reps Supine Bridge with Resistance Band - 2 x daily - 7 x weekly - 1 sets - 10 reps Seated Hamstring Stretch - 2 x daily - 7 x weekly - 1 sets - 2 reps - 30-45 seconds hold Seated Figure 4 Piriformis Stretch - 2 x daily - 1 reps - 30-45 sec hold

## 2020-05-26 ENCOUNTER — Ambulatory Visit: Payer: Medicare HMO | Admitting: Physical Therapy

## 2020-05-26 ENCOUNTER — Encounter: Payer: Self-pay | Admitting: Physical Therapy

## 2020-05-26 ENCOUNTER — Other Ambulatory Visit: Payer: Self-pay

## 2020-05-26 DIAGNOSIS — M62838 Other muscle spasm: Secondary | ICD-10-CM

## 2020-05-26 DIAGNOSIS — M6281 Muscle weakness (generalized): Secondary | ICD-10-CM | POA: Diagnosis not present

## 2020-05-26 DIAGNOSIS — M5441 Lumbago with sciatica, right side: Secondary | ICD-10-CM

## 2020-05-26 DIAGNOSIS — G8929 Other chronic pain: Secondary | ICD-10-CM

## 2020-05-26 NOTE — Patient Instructions (Signed)
Access Code: [email or web address]: [email or web address]: 11/09/2021Prepared by: [place]  Standing Glute Med Mobilization with Small Ball on Wall - 2 x daily - 30-60 sec hold  Mini Squat - 1 x daily - 7 x weekly - 1 sets - 10 reps  Supine ITB Stretch with Strap - 2 x daily - 1 reps - 30-45 sec hold  Prone Quadriceps Stretch with Strap - 1 x daily - 7 x weekly - 1 sets - 2-3 reps - 30-45 seconds hold  Clamshell with Resistance - 2 x daily - 7 x weekly - 1 sets - 10 reps  Supine Bridge with Resistance Band - 2 x daily - 7 x weekly - 1 sets - 10 reps  Seated Hamstring Stretch - 2 x daily - 7 x weekly - 1 sets - 2 reps - 30-45 seconds hold  Seated Figure 4 Piriformis Stretch - 2 x daily - 1 reps - 30-45 sec hold  Sit to Stand without Arm Support - 1 x daily - 7 x weekly - 1 sets - 10 reps

## 2020-05-26 NOTE — Therapy (Signed)
[place], [place], [address] Phone: [phone]   Fax:  [phone]  Physical Therapy Treatment  Patient Details  Name: Robin Christensen MRN: [id number] Date of Birth: 1951-04-29 Referring Provider (PT): Dr London Pepper   Encounter Date: 05/26/2020   PT End of Session - 05/26/20 1022    Visit Number 7    Number of Visits 16    Date for PT Re-Evaluation 06/18/20    Authorization Type [employer] MCR    Authorization - Visit Number 7    PT Start Time 2094    PT Stop Time 1100    PT Time Calculation (min) 39 min    Activity Tolerance Patient tolerated treatment well    Behavior During Therapy [facility] for tasks assessed/performed           Past Medical History:  Diagnosis Date  . A-fib ([place])   . B12 deficiency   . Back pain   . Breast cancer ([place])   . Cancer [facility])    Right Breast Cancer  . Chronic headaches   . Dysrhythmia    AF  . Edema, lower extremity   . Gallbladder disease   . GERD (gastroesophageal reflux disease)   . Hyperlipidemia   . Hypertension   . Hypothyroidism   . Joint pain   . Obesity   . Pneumonia   . PONV (postoperative nausea and vomiting)   . Sleep apnea    cpcap  . Vitamin deficiency     Past Surgical History:  Procedure Laterality Date  . BREAST SURGERY    . CARDIOVERSION N/A 06/26/2014   Procedure: CARDIOVERSION;  Surgeon: Laverda Page, MD;  Location: [place];  Service: Cardiovascular;  Laterality: N/A;  . CATARACT EXTRACTION    . CHOLECYSTECTOMY    . DILATION AND CURETTAGE OF UTERUS    . eyelid surgery    . TOTAL KNEE ARTHROPLASTY Right 05/03/2016   Procedure: TOTAL KNEE ARTHROPLASTY;  Surgeon: Melrose Nakayama, MD;  Location: [place];  Service: Orthopedics;  Laterality: Right;  . TOTAL KNEE ARTHROPLASTY Left 08/14/2018   Procedure: TOTAL KNEE ARTHROPLASTY;  Surgeon: Melrose Nakayama, MD;  Location: [place];  Service: Orthopedics;  Laterality: Left;     There were no vitals filed for this visit.   Subjective Assessment - 05/26/20 1023    Subjective Pt reports she has good days and bad days.  "Today is a so-so day".   She woke with increased pain (4-5/10); she walked and the pain eased up.  She walks dailiy, uses ball for massage, and TENS occasionally.    Patient Stated Goals get 50-60% better with less pain    Currently in Pain? Yes    Pain Score 1     Pain Location Buttocks    Pain Orientation Right    Pain Descriptors / Indicators Sore    Pain Radiating Towards to toes              [facility] PT Assessment - 05/26/20 0001      Assessment   Medical Diagnosis Sciatic pain     Referring Provider (PT) Dr London Pepper    Onset Date/Surgical Date 04/23/19    Hand Dominance Right    Next MD Visit PRN           [facility] Adult PT Treatment/Exercise - 05/26/20 0001      Lumbar Exercises: Stretches   Passive Hamstring Stretch Right;Left;1 rep   45 sec  Passive Hamstring Stretch Limitations cues for straight back and hip hinge.     Quad Stretch Right;Left;2 reps;30 seconds   prone with strap     Lumbar Exercises: Aerobic   Tread Mill 2.0 mph x 5.5 min       Lumbar Exercises: Standing   Functional Squats 15 reps   eccentric lowering (sit to/from stand)   Functional Squats Limitations cues for even wt between feet, core engaged, straight back.     Lifting From waist;to overhead;10 reps    Lifting Weights (lbs) 1    Lifting Limitations core engaged.       Lumbar Exercises: Supine   Clam 5 reps   green band - each leg   Bridge 10 reps   with TA engaged and arm press    Bridge Limitations green band at thighs.       Lumbar Exercises: Sidelying   Clam Right;10 x 2 sets reps;Left;10 reps   green band at thighs; cues for leg positioning and core engagement                 PT Education - 05/26/20 1314    Education Details added sit to/from stand, issued green band    Person(s) Educated Patient    Methods  Explanation;Handout;Tactile cues;Verbal cues;Demonstration    Comprehension Verbalized understanding;Returned demonstration               PT Long Term Goals - 05/26/20 1031      PT LONG TERM GOAL #1   Title independent with HEP for core strengthening ( 05/20/2020)    Time 4    Period Weeks    Status Revised      PT LONG TERM GOAL #2   Title FOTO score improved to </= 42% limited for improved function  ( 05/20/2020)    Time 4    Period Weeks    Status Revised      PT LONG TERM GOAL #3   Title report pain < 2/10 for improved function and mobility ( 05/20/2020)    Baseline Pain is 3/10 at this time.    Time 4    Period Weeks    Status Revised      PT LONG TERM GOAL #4   Title The patient will be able to sit at computer x 30 minutes without increased R sided low back pain.    Time 4    Period Weeks    Status New      PT LONG TERM GOAL #5   Title increase Rt hip and low back strength to assist with decreasing pain with sleeping    Time 4    Period Weeks    Status Revised                 Plan - 05/26/20 1311    Clinical Impression Statement Pt initially reporting 1-2/10 pain in Rt buttock; resolved after completing exercises today.  Pt demonstrated functional weakness in quads with decreased ability to control descent to sit on low surface.  With cues on hip hinge, forward wt shift and repetition of exercise, pt reported no pain in buttocks or Lt knee for sit to stand.   Pt able to tolerate increased resistance with sidelying clams (issued green band today).  Pt making progress towards goals.    Rehab Potential Good    PT Frequency 2x / week    PT Duration 4 weeks    PT Treatment/Interventions Iontophoresis 4mg /ml Dexamethasone;Taping;Patient/family education;Functional mobility  training;Moist Heat;Traction;Therapeutic activities;Passive range of motion;Therapeutic exercise;Cryotherapy;Electrical Stimulation;Manual techniques;Dry needling;Spinal Manipulations    PT  Next Visit Plan Strengthening LEs-- symmetrical loading for squats; progress hip abduction control, add step ups to tolerance.  STM/IASTM/DN for IT band, gluts, piriformis.    PT Home Exercise Plan Access Code: [email or web address]    [email or web address] and Agree with Plan of Care Patient           Patient will benefit from skilled therapeutic intervention in order to improve the following deficits and impairments:  Decreased range of motion, Obesity, Increased muscle spasms, Pain, Impaired flexibility, Decreased strength  Visit Diagnosis: Muscle weakness (generalized)  Other muscle spasm  Chronic right-sided low back pain with right-sided sciatica     Problem List Patient Active Problem List   Diagnosis Date Noted  . Atrial fibrillation ([place]) 04/23/2020  . Vitamin D deficiency 04/08/2020  . Hyperglycemia 03/12/2020  . Essential hypertension 03/12/2020  . Other hyperlipidemia 01/14/2020  . Lower extremity edema 01/14/2020  . Depression 01/14/2020  . Class 2 severe obesity with serious comorbidity and body mass index (BMI) of 35.0 to 35.9 in adult [facility]) 01/14/2020  . Primary osteoarthritis of left knee 08/14/2018  . Primary localized osteoarthritis of right knee 05/03/2016  . Primary osteoarthritis of right knee 05/03/2016  . Abnormal auditory perception of both ears 12/01/2015   Kerin Perna, PTA 05/26/20 1:20 PM  [facility] [place], [place], [address] Phone: [phone]   Fax:  [phone]  Name: Robin Christensen MRN: [id number] Date of Birth: [birth year]

## 2020-05-26 NOTE — Addendum Note (Signed)
Addended by: Rudell Cobb M on: 05/26/2020 12:50 PM   Modules accepted: Orders

## 2020-06-01 ENCOUNTER — Encounter (INDEPENDENT_AMBULATORY_CARE_PROVIDER_SITE_OTHER): Payer: Self-pay | Admitting: Adult Health

## 2020-06-01 ENCOUNTER — Other Ambulatory Visit: Payer: Self-pay

## 2020-06-01 ENCOUNTER — Ambulatory Visit (INDEPENDENT_AMBULATORY_CARE_PROVIDER_SITE_OTHER): Payer: Medicare HMO | Admitting: Adult Health

## 2020-06-01 VITALS — BP 116/66 | HR 62 | Temp 98.5°F | Ht 68.0 in | Wt 228.0 lb

## 2020-06-01 DIAGNOSIS — Z6834 Body mass index (BMI) 34.0-34.9, adult: Secondary | ICD-10-CM | POA: Diagnosis not present

## 2020-06-01 DIAGNOSIS — E669 Obesity, unspecified: Secondary | ICD-10-CM | POA: Diagnosis not present

## 2020-06-01 DIAGNOSIS — I10 Essential (primary) hypertension: Secondary | ICD-10-CM

## 2020-06-01 DIAGNOSIS — R5383 Other fatigue: Secondary | ICD-10-CM

## 2020-06-01 DIAGNOSIS — F3289 Other specified depressive episodes: Secondary | ICD-10-CM

## 2020-06-01 MED ORDER — BUPROPION HCL ER (SR) 150 MG PO TB12: 150.0000 mg | ORAL_TABLET | Freq: Every day | ORAL | 0 refills | Status: DC

## 2020-06-01 NOTE — Progress Notes (Signed)
Chief Complaint:   OBESITY Robin Christensen is here to discuss her progress with her obesity treatment plan along with follow-up of her obesity related diagnoses. Robin Christensen is on the Category 3 Plan and states she is following her eating plan approximately 85% of the time. Robin Christensen states she is walking 30-60 minutes 3-4 times per week.  Today's visit was #: 21 Starting weight: 296 lbs Starting date: 07/25/2019 Today's weight: 228 lbs Today's date: 06/01/2020 Total lbs lost to date: 5 Total lbs lost since last in-office visit: 6  Interim History: Robin Christensen is down 6 lbs since her last office visit for a total of 68 lbs since beginning the program! She has not been walking as frequently as she would like due to early morning appointments and increased fatigue. Her orthopedic specialist recommends her to walk no more than 1-2 miles at one time. Her next goal is to lose 2 more lbs to reach a 70 lb weight loss.  She prefers to set small goal intervals for herself- great!  Subjective:   Essential hypertension. Blood pressure and heart rate are excellent on today's office visit.  Robin Christensen is on furosemide 40 mg PRN edema - 1-2 per month. She denies cardiac symptoms.  BP Readings from Last 3 Encounters:  06/01/20 116/66  04/23/20 113/70  04/02/20 108/70   Lab Results  Component Value Date   CREATININE 0.89 03/12/2020   CREATININE 0.95 11/14/2019   CREATININE 0.78 07/25/2019   Other fatigue increased. Davis reports increased fatigue levels the last few weeks. She has been on levothyroxine 75 mcg daily for years.  Other depression, with emotional eating. Robin Christensen is struggling with emotional eating and using food for comfort to the extent that it is negatively impacting her health. She has been working on behavior modification techniques to help reduce her emotional eating and has been somewhat successful. She shows no sign of suicidal or homicidal ideations. Blood pressure and  heart rate are excellent on today's office visit. Robin Christensen reports excellent control of cravings on bupropion SR 150 mg daily. She denies history of seizure. 07/25/2019 EKG-stable.  Assessment/Plan:   Essential hypertension. Robin Christensen is working on healthy weight loss and exercise to improve blood pressure control. We will watch for signs of hypotension as she continues her lifestyle modifications. Labs will be checked her next visit.  Other fatigue increased. Will check thyroid panel at the time of her next office visit.  Other depression, with emotional eating. Behavior modification techniques were discussed today to help Robin Christensen deal with her emotional/non-hunger eating behaviors.  Orders and follow up as documented in patient record. Refill was given for buPROPion (WELLBUTRIN SR) 150 MG 12 hr tablet #30 with 0 refills.  Class 1 obesity with serious comorbidity and body mass index (BMI) of 34.0 to 34.9 in adult, unspecified obesity type.  Robin Christensen is currently in the action stage of change. As such, her goal is to continue with weight loss efforts. She has agreed to the Category 3 Plan.   Fasting labs will be checked at her next office visit.  Handout was provided on Thanksgiving.  Exercise goals: Robin Christensen will continue walking 30-60 minutes 3-4 times per week.  Behavioral modification strategies: increasing lean protein intake, decreasing simple carbohydrates, meal planning and cooking strategies, better snacking choices and planning for success.  Robin Christensen has agreed to follow-up with our clinic fasting in 3 weeks. She was informed of the importance of frequent follow-up visits to maximize her success with intensive lifestyle modifications  for her multiple health conditions.   Objective:   Blood pressure 116/66, pulse 62, temperature 98.5 F (36.9 C), height 5\' 8"  (1.727 m), weight 228 lb (103.4 kg), SpO2 100 %. Body mass index is 34.67 kg/m.  General: Cooperative, alert, well  developed, in no acute distress. HEENT: Conjunctivae and lids unremarkable. Cardiovascular: Regular rhythm.  Lungs: Normal work of breathing. Neurologic: No focal deficits.   Lab Results  Component Value Date   CREATININE 0.89 03/12/2020   BUN 16 03/12/2020   NA 142 03/12/2020   K 4.1 03/12/2020   CL 103 03/12/2020   CO2 28 03/12/2020   Lab Results  Component Value Date   ALT 15 03/12/2020   AST 19 03/12/2020   ALKPHOS 109 03/12/2020   BILITOT 0.4 03/12/2020   Lab Results  Component Value Date   HGBA1C 5.4 03/12/2020   HGBA1C 5.6 11/14/2019   HGBA1C 5.4 07/25/2019   Lab Results  Component Value Date   INSULIN 6.9 03/12/2020   INSULIN 6.5 11/14/2019   INSULIN 10.3 07/25/2019   Lab Results  Component Value Date   TSH 1.810 11/14/2019   Lab Results  Component Value Date   CHOL 160 03/12/2020   HDL 61 03/12/2020   LDLCALC 84 03/12/2020   TRIG 80 03/12/2020   CHOLHDL 2.6 03/12/2020   Lab Results  Component Value Date   WBC 6.4 07/25/2019   HGB 14.9 07/25/2019   HCT 46.7 (H) 07/25/2019   MCV 89 07/25/2019   PLT 257 07/25/2019   Lab Results  Component Value Date   IRON 97 07/25/2019   TIBC 374 07/25/2019   FERRITIN 91 07/25/2019   Obesity Behavioral Intervention:   Approximately 15 minutes were spent on the discussion below.  ASK: We discussed the diagnosis of obesity with Robin Christensen today and Robin Christensen agreed to give [name] permission to discuss obesity behavioral modification therapy today.  ASSESS: Stephine has the diagnosis of obesity and her BMI today is 34.8. Robin Christensen is in the action stage of change.   ADVISE: Robin Christensen was educated on the multiple health risks of obesity as well as the benefit of weight loss to improve her health. She was advised of the need for long term treatment and the importance of lifestyle modifications to improve her current health and to decrease her risk of future health problems.  AGREE: Multiple dietary modification  options and treatment options were discussed and Robin Christensen agreed to follow the recommendations documented in the above note.  ARRANGE: Robin Christensen was educated on the importance of frequent visits to treat obesity as outlined per CMS and USPSTF guidelines and agreed to schedule her next follow up appointment today.  Attestation Statements:   Reviewed by clinician on day of visit: allergies, medications, problem list, medical history, surgical history, family history, social history, and previous encounter notes.  I, Michaelene Song, am acting as Location manager for [employer], NP-C   I have reviewed the above documentation for accuracy and completeness, and I agree with the above. -  Jinny Sweetland d. Tausha Milhoan, NP-C

## 2020-06-03 ENCOUNTER — Other Ambulatory Visit: Payer: Self-pay

## 2020-06-03 ENCOUNTER — Ambulatory Visit: Payer: Medicare HMO | Admitting: Physical Therapy

## 2020-06-03 DIAGNOSIS — M62838 Other muscle spasm: Secondary | ICD-10-CM | POA: Diagnosis not present

## 2020-06-03 DIAGNOSIS — M5441 Lumbago with sciatica, right side: Secondary | ICD-10-CM | POA: Diagnosis not present

## 2020-06-03 DIAGNOSIS — M6281 Muscle weakness (generalized): Secondary | ICD-10-CM | POA: Diagnosis not present

## 2020-06-03 DIAGNOSIS — G8929 Other chronic pain: Secondary | ICD-10-CM | POA: Diagnosis not present

## 2020-06-03 NOTE — Therapy (Signed)
[place], [place], [address] Phone: [phone]   Fax:  [phone]  Physical Therapy Treatment  Patient Details  Name: Robin Christensen MRN: [id number] Date of Birth: [birth year] Referring Provider (PT): Dr London Pepper   Encounter Date: 06/03/2020   PT End of Session - 06/03/20 1115    Visit Number 8    Number of Visits 16    Date for PT Re-Evaluation 06/18/20    Authorization Type [employer] MCR    Authorization - Visit Number 8    Authorization - Number of Visits 14    PT Start Time 7680    PT Stop Time 1105    PT Time Calculation (min) 44 min    Activity Tolerance Patient tolerated treatment well    Behavior During Therapy [facility] for tasks assessed/performed           Past Medical History:  Diagnosis Date  . A-fib ([place])   . B12 deficiency   . Back pain   . Breast cancer ([place])   . Cancer [facility])    Right Breast Cancer  . Chronic headaches   . Dysrhythmia    AF  . Edema, lower extremity   . Gallbladder disease   . GERD (gastroesophageal reflux disease)   . Hyperlipidemia   . Hypertension   . Hypothyroidism   . Joint pain   . Obesity   . Pneumonia   . PONV (postoperative nausea and vomiting)   . Sleep apnea    cpcap  . Vitamin deficiency     Past Surgical History:  Procedure Laterality Date  . BREAST SURGERY    . CARDIOVERSION N/A 06/26/2014   Procedure: CARDIOVERSION;  Surgeon: Laverda Page, MD;  Location: [place];  Service: Cardiovascular;  Laterality: N/A;  . CATARACT EXTRACTION    . CHOLECYSTECTOMY    . DILATION AND CURETTAGE OF UTERUS    . eyelid surgery    . TOTAL KNEE ARTHROPLASTY Right 05/03/2016   Procedure: TOTAL KNEE ARTHROPLASTY;  Surgeon: Melrose Nakayama, MD;  Location: [place];  Service: Orthopedics;  Laterality: Right;  . TOTAL KNEE ARTHROPLASTY Left 08/14/2018   Procedure: TOTAL KNEE ARTHROPLASTY;  Surgeon: Melrose Nakayama, MD;  Location: [place];   Service: Orthopedics;  Laterality: Left;    There were no vitals filed for this visit.   Subjective Assessment - 06/03/20 1024    Subjective Pt reports she felt pretty good for a couple of days, but pain began to return.  "I feel like I'm sitting on an acorn".  "Today is not a bad day".  She has some questions about the HEP.    Patient Stated Goals get 50-60% better with less pain    Currently in Pain? Yes    Pain Score 2     Pain Location Buttocks    Pain Orientation Right    Pain Descriptors / Indicators Sore    Aggravating Factors  sitting too long    Pain Relieving Factors soft tissue self mobilization              [facility] PT Assessment - 06/03/20 0001      Assessment   Medical Diagnosis Sciatic pain     Referring Provider (PT) Dr London Pepper    Onset Date/Surgical Date 04/23/19    Hand Dominance Right    Next MD Visit PRN           [facility] Adult PT Treatment/Exercise - 06/03/20 0001  Self-Care   Other Self-Care Comments  Pt performed self release MFR with ball against buttocks at wall x 1.5 min      Lumbar Exercises: Stretches   Passive Hamstring Stretch Right;1 rep;30 seconds   supine with strap   Standing Extension 2 reps;5 seconds    Prone on Elbows Stretch 5 reps;10 seconds    Prone on Elbows Stretch Limitations initially painful; improved tolerance after ball release to ant Rt hip.     Quad Stretch Right;Left;2 reps;30 seconds   prone with strap   ITB Stretch Right;1 rep;30 seconds   supine with strap     Lumbar Exercises: Aerobic   Tread Mill 2.0 mph x 5 min       Lumbar Exercises: Supine   Bridge 10 reps   yoga strap at thighs   Bridge Limitations some pain in Rt buttocks during hip ext       Lumbar Exercises: Sidelying   Clam Right;10 reps   green band at thighs      Lumbar Exercises: Prone   Other Prone Lumbar Exercises self release with ball to Rt ant hip x 5 min      Manual Therapy   Soft tissue mobilization STM to Rt glute med/max,  piriformis              PT Long Term Goals - 06/03/20 1030      PT LONG TERM GOAL #1   Title independent with HEP for core strengthening ( 05/20/2020)    Time 4    Period Weeks    Status On-going      PT LONG TERM GOAL #2   Title FOTO score improved to </= 42% limited for improved function  ( 05/20/2020)    Time 4    Period Weeks    Status On-going      PT LONG TERM GOAL #3   Title report pain < 2/10 for improved function and mobility ( 05/20/2020)    Baseline Pain is 3/10 at this time.    Time 4    Period Weeks    Status On-going      PT LONG TERM GOAL #4   Title The patient will be able to sit at computer x 30 minutes without increased R sided low back pain.    Baseline can sit 30 min, but must shift frequently    Time 4    Period Weeks    Status Partially Met      PT LONG TERM GOAL #5   Title increase Rt hip and low back strength to assist with decreasing pain with sleeping    Time 4    Period Weeks    Status Revised                 Plan - 06/03/20 1038    Clinical Impression Statement Pain in Rt buttock reduced with walking and with self release with ball to ant hip. Increased pain and burning into Rt buttocks with bridge exercise; reduced with self massage to posterior hip and walking. Pt requires minor cues throughout session for form.  Pt reported reduction of pain by end of session.  Encouraged to continue walking program and HEP; will modify as needed in future sessions. LTG 4 partially met since last visit.    Rehab Potential Good    PT Frequency 2x / week    PT Duration 4 weeks    PT Treatment/Interventions Iontophoresis 31m/ml Dexamethasone;Taping;Patient/family education;Functional mobility training;Moist Heat;Traction;Therapeutic activities;Passive range  of motion;Therapeutic exercise;Cryotherapy;Electrical Stimulation;Manual techniques;Dry needling;Spinal Manipulations    PT Next Visit Plan Strengthening LEs-- symmetrical loading for squats;  progress hip abduction control, add step ups to tolerance.  STM/IASTM/DN for IT band, gluts, piriformis.    PT Home Exercise Plan Access Code: [email or web address]    [email or web address] and Agree with Plan of Care Patient           Patient will benefit from skilled therapeutic intervention in order to improve the following deficits and impairments:  Decreased range of motion, Obesity, Increased muscle spasms, Pain, Impaired flexibility, Decreased strength  Visit Diagnosis: Muscle weakness (generalized)  Other muscle spasm  Chronic right-sided low back pain with right-sided sciatica     Problem List Patient Active Problem List   Diagnosis Date Noted  . Other fatigue increased 06/01/2020  . Atrial fibrillation ([place]) 04/23/2020  . Vitamin D deficiency 04/08/2020  . Hyperglycemia 03/12/2020  . Essential hypertension 03/12/2020  . Other hyperlipidemia 01/14/2020  . Lower extremity edema 01/14/2020  . Depression 01/14/2020  . Class 1 obesity with serious comorbidity and body mass index (BMI) of 34.0 to 34.9 in adult 01/14/2020  . Primary osteoarthritis of left knee 08/14/2018  . Primary localized osteoarthritis of right knee 05/03/2016  . Primary osteoarthritis of right knee 05/03/2016  . Abnormal auditory perception of both ears 12/01/2015   Kerin Perna, PTA 06/03/20 11:24 AM  [place], [place], [address] Phone: [phone]   Fax:  [phone]  Name: Robin Christensen MRN: [id number] Date of Birth: 1951/02/04

## 2020-06-10 ENCOUNTER — Ambulatory Visit: Payer: Self-pay | Admitting: Neurology

## 2020-06-15 DIAGNOSIS — M85852 Other specified disorders of bone density and structure, left thigh: Secondary | ICD-10-CM | POA: Diagnosis not present

## 2020-06-15 DIAGNOSIS — Z780 Asymptomatic menopausal state: Secondary | ICD-10-CM | POA: Diagnosis not present

## 2020-06-17 ENCOUNTER — Other Ambulatory Visit: Payer: Self-pay

## 2020-06-17 ENCOUNTER — Ambulatory Visit: Payer: Medicare HMO | Admitting: Physical Therapy

## 2020-06-17 ENCOUNTER — Encounter: Payer: Self-pay | Admitting: Physical Therapy

## 2020-06-17 DIAGNOSIS — G8929 Other chronic pain: Secondary | ICD-10-CM | POA: Diagnosis not present

## 2020-06-17 DIAGNOSIS — M5441 Lumbago with sciatica, right side: Secondary | ICD-10-CM

## 2020-06-17 DIAGNOSIS — M6281 Muscle weakness (generalized): Secondary | ICD-10-CM

## 2020-06-17 DIAGNOSIS — M62838 Other muscle spasm: Secondary | ICD-10-CM

## 2020-06-17 NOTE — Therapy (Signed)
[facility] [place] [facility] [place], [place], [address] Phone: [phone]   Fax:  [phone]  Physical Therapy Treatment  Patient Details  Name: Robin Christensen MRN: [id number] Date of Birth: March 20, 1951 Referring Provider (PT): Dr London Pepper   Encounter Date: 06/17/2020   PT End of Session - 06/17/20 1149    Visit Number 9    Number of Visits 16    Date for PT Re-Evaluation 06/18/20    Authorization Type [employer] MCR    Authorization - Visit Number 9    Authorization - Number of Visits 14    PT Start Time 1150    PT Stop Time 1230    PT Time Calculation (min) 40 min    Activity Tolerance Patient tolerated treatment well    Behavior During Therapy [facility] for tasks assessed/performed           Past Medical History:  Diagnosis Date  . A-fib ([place])   . B12 deficiency   . Back pain   . Breast cancer ([place])   . Cancer [facility])    Right Breast Cancer  . Chronic headaches   . Dysrhythmia    AF  . Edema, lower extremity   . Gallbladder disease   . GERD (gastroesophageal reflux disease)   . Hyperlipidemia   . Hypertension   . Hypothyroidism   . Joint pain   . Obesity   . Pneumonia   . PONV (postoperative nausea and vomiting)   . Sleep apnea    cpcap  . Vitamin deficiency     Past Surgical History:  Procedure Laterality Date  . BREAST SURGERY    . CARDIOVERSION N/A 06/26/2014   Procedure: CARDIOVERSION;  Surgeon: Laverda Page, MD;  Location: [place];  Service: Cardiovascular;  Laterality: N/A;  . CATARACT EXTRACTION    . CHOLECYSTECTOMY    . DILATION AND CURETTAGE OF UTERUS    . eyelid surgery    . TOTAL KNEE ARTHROPLASTY Right 05/03/2016   Procedure: TOTAL KNEE ARTHROPLASTY;  Surgeon: Melrose Nakayama, MD;  Location: [place];  Service: Orthopedics;  Laterality: Right;  . TOTAL KNEE ARTHROPLASTY Left 08/14/2018   Procedure: TOTAL KNEE ARTHROPLASTY;  Surgeon: Melrose Nakayama, MD;  Location: [place];   Service: Orthopedics;  Laterality: Left;    There were no vitals filed for this visit.   Subjective Assessment - 06/17/20 1150    Subjective Pt reports she has had good days and bad days.  She reports she had a rough day last Wed due to prepping for Thanksgiving; Thursday was "a complete recoup day in recliner".  She complains of severe pain at night, after 4 hrs of sleep; wakes and takes extra strength tylenol to reduce pain.    Patient Stated Goals get 50-60% better with less pain    Currently in Pain? Yes    Pain Score 1     Pain Location Buttocks    Pain Orientation Right    Pain Descriptors / Indicators Sore    Aggravating Factors  sitting too long    Pain Relieving Factors STM with ball.              [facility] PT Assessment - 06/17/20 0001      Assessment   Medical Diagnosis Sciatic pain     Referring Provider (PT) Dr London Pepper    Onset Date/Surgical Date 04/23/19    Hand Dominance Right    Next MD Visit PRN  [facility] Adult PT Treatment/Exercise - 06/17/20 0001      Lumbar Exercises: Stretches   Passive Hamstring Stretch Right;Left;1 rep;30 seconds    Standing Extension 2 reps;5 seconds    Piriformis Stretch Right;Left;2 reps;30 seconds      Lumbar Exercises: Aerobic   Tread Mill 2.0 mph x 8 min       Lumbar Exercises: Standing   Lifting From waist;to overhead;10 reps with core engaged   Lifting Weights (lbs) 2    Wall Slides 10 reps;2 seconds   quarter; with 2# in/out from core    Other Standing Lumbar Exercises Stairs with core engaged x 10 - 6" step, BUE support on rails.  Rt/Lt SLS x 10 sec each leg.    Other Standing Lumbar Exercises Hip abduction x 10 reps, 2 sets.  Hip ext x 10 reps, 2 sets each leg.       Lumbar Exercises: Sidelying   Clam Left;Right;10 reps;4 seconds      Lumbar Exercises: Prone   Other Prone Lumbar Exercises self release with ball to Rt ant hip x 2 min          Took bridge out of HEP due to continued irritation of Rt  buttock with this exercise.   Eliminated clam with band; kept traditional clam. Eliminated mini squat and sit to/from stand - and kept wall slide due to improved tolerance.    HEP modified and issued.  Pt verbalized understanding.     PT Long Term Goals - 06/17/20 1258      PT LONG TERM GOAL #1   Title independent with HEP for core strengthening ( 05/20/2020)    Time 4    Period Weeks    Status On-going      PT LONG TERM GOAL #2   Title FOTO score improved to </= 42% limited for improved function  ( 05/20/2020)    Time 4    Period Weeks    Status On-going      PT LONG TERM GOAL #3   Title report pain < 2/10 for improved function and mobility ( 05/20/2020)    Baseline Pain is 1-3/10 at this time.    Time 4    Period Weeks    Status Partially Met      PT LONG TERM GOAL #4   Title The patient will be able to sit at computer x 30 minutes without increased R sided low back pain.    Baseline can sit 30 min, but must shift frequently    Time 4    Period Weeks    Status Partially Met      PT LONG TERM GOAL #5   Title increase Rt hip and low back strength to assist with decreasing pain with sleeping    Baseline continues to wake in night from Rt hip/buttock pain.    Time 4    Period Weeks    Status On-going                 Plan - 06/17/20 1209    Clinical Impression Statement Pt reported reduction of Rt buttock pain after stretching and performing quarter wall squats.  Some increase in Rt buttock symptoms with SLS exercises at counter; reduced with walking.  Pt was painfree at end of session.  Encouraged pt to continue consistent HEP compliance and self care. Recommended trial of ball release work to hip/buttocks when she wakes in night to reduce pain. She has partially met her goals.  Rehab Potential Good    PT Frequency 2x / week    PT Duration 4 weeks    PT Treatment/Interventions Iontophoresis 52m/ml Dexamethasone;Taping;Patient/family education;Functional mobility  training;Moist Heat;Traction;Therapeutic activities;Passive range of motion;Therapeutic exercise;Cryotherapy;Electrical Stimulation;Manual techniques;Dry needling;Spinal Manipulations    PT Next Visit Plan 10th visit.  assess response to HEP changes; continue with core/LE/lumbar strengthening - progress as tolerated.  manual therapy if indicated.    PT Home Exercise Plan [id number]    Consulted and Agree with Plan of Care Patient           Patient will benefit from skilled therapeutic intervention in order to improve the following deficits and impairments:  Decreased range of motion, Obesity, Increased muscle spasms, Pain, Impaired flexibility, Decreased strength  Visit Diagnosis: Muscle weakness (generalized)  Other muscle spasm  Chronic right-sided low back pain with right-sided sciatica     Problem List Patient Active Problem List   Diagnosis Date Noted  . Other fatigue increased 06/01/2020  . Atrial fibrillation ([place] 04/23/2020  . Vitamin D deficiency 04/08/2020  . Hyperglycemia 03/12/2020  . Essential hypertension 03/12/2020  . Other hyperlipidemia 01/14/2020  . Lower extremity edema 01/14/2020  . Depression 01/14/2020  . Class 1 obesity with serious comorbidity and body mass index (BMI) of 34.0 to 34.9 in adult 01/14/2020  . Primary osteoarthritis of left knee 08/14/2018  . Primary localized osteoarthritis of right knee 05/03/2016  . Primary osteoarthritis of right knee 05/03/2016  . Abnormal auditory perception of both ears 12/01/2015   JKerin Perna PTA 06/17/20 12:59 PM  [place] [address]Phone: [phone]  Fax:  [phone] Name: Robin RYERMRN: [id number] of Birth: 212-Jun-1952

## 2020-06-17 NOTE — Patient Instructions (Signed)
Access Code: [email or web address] URL: [email or web address]: 12/01/2021Prepared by: [place]  Supine ITB Stretch with Strap - 2 x daily - 1 reps - 30-45 sec hold  Prone Quadriceps Stretch with Strap - 1 x daily - 7 x weekly - 1 sets - 2-3 reps - 30-45 seconds hold  Seated Hamstring Stretch - 2 x daily - 7 x weekly - 1 sets - 2 reps - 30-45 seconds hold  Seated Figure 4 Piriformis Stretch - 2 x daily - 1 reps - 30-45 sec hold  Beginner Clam - 1 x daily - 7 x weekly - 2 sets - 10 reps  Wall Quarter Squat - 1 x daily - 4 x weekly - 1 sets - 10 reps  Standing Hip Extension with Counter Support - 1 x daily - 4 x weekly - 1 sets - 10 reps  Standing Hip Abduction with Counter Support - 1 x daily - 4 x weekly - 1 sets - 10 reps  Single Leg Stance - 1 x daily - 4 x weekly - 2-3 reps - 10 hold

## 2020-06-22 ENCOUNTER — Other Ambulatory Visit: Payer: Self-pay

## 2020-06-22 ENCOUNTER — Ambulatory Visit (INDEPENDENT_AMBULATORY_CARE_PROVIDER_SITE_OTHER): Payer: Medicare HMO | Admitting: Adult Health

## 2020-06-22 ENCOUNTER — Encounter (INDEPENDENT_AMBULATORY_CARE_PROVIDER_SITE_OTHER): Payer: Self-pay | Admitting: Adult Health

## 2020-06-22 VITALS — BP 110/68 | HR 61 | Temp 97.8°F | Ht 68.0 in | Wt 227.0 lb

## 2020-06-22 DIAGNOSIS — I10 Essential (primary) hypertension: Secondary | ICD-10-CM

## 2020-06-22 DIAGNOSIS — E669 Obesity, unspecified: Secondary | ICD-10-CM | POA: Diagnosis not present

## 2020-06-22 DIAGNOSIS — Z6834 Body mass index (BMI) 34.0-34.9, adult: Secondary | ICD-10-CM | POA: Diagnosis not present

## 2020-06-22 DIAGNOSIS — E782 Mixed hyperlipidemia: Secondary | ICD-10-CM

## 2020-06-22 DIAGNOSIS — F3289 Other specified depressive episodes: Secondary | ICD-10-CM

## 2020-06-22 NOTE — Progress Notes (Signed)
Chief Complaint:   OBESITY Robin Christensen is here to discuss her progress with her obesity treatment plan along with follow-up of her obesity related diagnoses. Robin Christensen is on the Category 3 Plan and states she is following her eating plan approximately 75% of the time. Robin Christensen states she is exercising 0 minutes 0 times per week.  Today's visit was #: 18 Starting weight: 296 lbs Starting date: 07/25/2019 Today's weight: 227 lbs Today's date: 06/22/2020 Total lbs lost to date: 28 Total lbs lost since last in-office visit: 1  Interim History: Robin Christensen enjoyed ham at Thanksgiving and experienced upper/lower extremity edema - eventually resolved with two doses of furosemide. She packed up leftovers and gave to her youngest son. She was able to lose 1 lb over the holiday stretch! She has lost a total of 69 lbs! Her next small goal is to lose a total of 70 lbs and then lose down to 225 lbs.  Subjective:   Essential hypertension. Blood pressure and heart rate are stable on today's office visit. Robin Christensen is on furosemide 40 mg PRN and potassium supplement (K-Dur 20 mEq daily). She has been taking potassium daily even if not taking furosemide. She denies cardiac symptoms. She estimates to have taken daily potassium for more than 4 years.  BP Readings from Last 3 Encounters:  06/22/20 110/68  06/01/20 116/66  04/23/20 113/70   Lab Results  Component Value Date   CREATININE 0.89 03/12/2020   CREATININE 0.95 11/14/2019   CREATININE 0.78 07/25/2019   Other depression, with emotional eating. Robin Christensen is struggling with emotional eating and using food for comfort to the extent that it is negatively impacting her health. She has been working on behavior modification techniques to help reduce her emotional eating and has been somewhat successful. She shows no sign of suicidal or homicidal ideations. Blood pressure and heart rate are excellent on today's office visit. Robin Christensen reports stable  mood and cravings are well controlled on bupropion SR 150 mg daily.  Mixed hyperlipidemia. Robin Christensen is on pravastatin 40 mg daily. Last lipid panel on 03/12/2020 showed levels to be stable.   Lab Results  Component Value Date   CHOL 160 03/12/2020   HDL 61 03/12/2020   LDLCALC 84 03/12/2020   TRIG 80 03/12/2020   CHOLHDL 2.6 03/12/2020   Lab Results  Component Value Date   ALT 15 03/12/2020   AST 19 03/12/2020   ALKPHOS 109 03/12/2020   BILITOT 0.4 03/12/2020   The ASCVD Risk score Mikey Bussing DC Jr., et al., 2013) failed to calculate for the following reasons:   Unable to determine if patient is Non-Hispanic African American  Assessment/Plan:   Essential hypertension. Dwayne is working on healthy weight loss and exercise to improve blood pressure control. We will watch for signs of hypotension as she continues her lifestyle modifications. Comprehensive metabolic panel will be checked today.  Other depression, with emotional eating. Behavior modification techniques were discussed today to help Robin Christensen deal with her emotional/non-hunger eating behaviors.  Orders and follow up as documented in patient record. Robin Christensen will continue bupropion as directed.   Mixed hyperlipidemia. Cardiovascular risk and specific lipid/LDL goals reviewed.  We discussed several lifestyle modifications today and Robin Christensen will continue to work on diet, exercise and weight loss efforts. Orders and follow up as documented in patient record. Comprehensive metabolic panel will be checked today.  Counseling Intensive lifestyle modifications are the first line treatment for this issue. . Dietary changes: Increase soluble fiber. Decrease simple  carbohydrates. . Exercise changes: Moderate to vigorous-intensity aerobic activity 150 minutes per week if tolerated. . Lipid-lowering medications: see documented in medical record.   Class 1 obesity with serious comorbidity and body mass index (BMI) of 34.0 to 34.9 in  adult, unspecified obesity type.  Robin Christensen is currently in the action stage of change. As such, her goal is to continue with weight loss efforts. She has agreed to the Category 3 Plan.   Handout was provided on Christmas strategies.  Exercise goals: Robin Christensen will increase her daily walking.  Behavioral modification strategies: increasing lean protein intake, meal planning and cooking strategies, better snacking choices and planning for success.  Special has agreed to follow-up with our clinic in 4 weeks. She was informed of the importance of frequent follow-up visits to maximize her success with intensive lifestyle modifications for her multiple health conditions.   Robin Christensen was informed we would discuss her lab results at her next visit unless there is a critical issue that needs to be addressed sooner. Robin Christensen agreed to keep her next visit at the agreed upon time to discuss these results.  Objective:   Blood pressure 110/68, pulse 61, temperature 97.8 F (36.6 C), height 5\' 8"  (1.727 m), weight 227 lb (103 kg), SpO2 97 %. Body mass index is 34.52 kg/m.  General: Cooperative, alert, well developed, in no acute distress. HEENT: Conjunctivae and lids unremarkable. Cardiovascular: Regular rhythm.  Lungs: Normal work of breathing. Neurologic: No focal deficits.   Lab Results  Component Value Date   CREATININE 0.89 03/12/2020   BUN 16 03/12/2020   NA 142 03/12/2020   K 4.1 03/12/2020   CL 103 03/12/2020   CO2 28 03/12/2020   Lab Results  Component Value Date   ALT 15 03/12/2020   AST 19 03/12/2020   ALKPHOS 109 03/12/2020   BILITOT 0.4 03/12/2020   Lab Results  Component Value Date   HGBA1C 5.4 03/12/2020   HGBA1C 5.6 11/14/2019   HGBA1C 5.4 07/25/2019   Lab Results  Component Value Date   INSULIN 6.9 03/12/2020   INSULIN 6.5 11/14/2019   INSULIN 10.3 07/25/2019   Lab Results  Component Value Date   TSH 1.810 11/14/2019   Lab Results  Component Value Date    CHOL 160 03/12/2020   HDL 61 03/12/2020   LDLCALC 84 03/12/2020   TRIG 80 03/12/2020   CHOLHDL 2.6 03/12/2020   Lab Results  Component Value Date   WBC 6.4 07/25/2019   HGB 14.9 07/25/2019   HCT 46.7 (H) 07/25/2019   MCV 89 07/25/2019   PLT 257 07/25/2019   Lab Results  Component Value Date   IRON 97 07/25/2019   TIBC 374 07/25/2019   FERRITIN 91 07/25/2019   Obesity Behavioral Intervention:   Approximately 15 minutes were spent on the discussion below.  ASK: We discussed the diagnosis of obesity with Robin Christensen today and Robin Christensen agreed to give [name] permission to discuss obesity behavioral modification therapy today.  ASSESS: Robin Christensen has the diagnosis of obesity and her BMI today is 34.6. Robin Christensen is in the action stage of change.   ADVISE: Robin Christensen was educated on the multiple health risks of obesity as well as the benefit of weight loss to improve her health. She was advised of the need for long term treatment and the importance of lifestyle modifications to improve her current health and to decrease her risk of future health problems.  AGREE: Multiple dietary modification options and treatment options were discussed and Robin Christensen agreed to  follow the recommendations documented in the above note.  ARRANGE: Robin Christensen was educated on the importance of frequent visits to treat obesity as outlined per CMS and USPSTF guidelines and agreed to schedule her next follow up appointment today.  Attestation Statements:   Reviewed by clinician on day of visit: allergies, medications, problem list, medical history, surgical history, family history, social history, and previous encounter notes.  I, Michaelene Song, am acting as Location manager for [employer], NP-C   I have reviewed the above documentation for accuracy and completeness, and I agree with the above. -  Robin Christensen d. Robin Brodzinski, NP-C

## 2020-06-23 LAB — COMPREHENSIVE METABOLIC PANEL
ALT: 13 IU/L (ref 0–32)
AST: 17 IU/L (ref 0–40)
Albumin/Globulin Ratio: 1.8 (ref 1.2–2.2)
Albumin: 4.2 g/dL (ref 3.8–4.8)
Alkaline Phosphatase: 96 IU/L (ref 44–121)
BUN/Creatinine Ratio: 16 (ref 12–28)
BUN: 15 mg/dL (ref 8–27)
Bilirubin Total: 0.5 mg/dL (ref 0.0–1.2)
CO2: 25 mmol/L (ref 20–29)
Calcium: 9.6 mg/dL (ref 8.7–10.3)
Chloride: 106 mmol/L (ref 96–106)
Creatinine, Ser: 0.91 mg/dL (ref 0.57–1.00)
GFR calc Af Amer: 74 mL/min/{1.73_m2} (ref >59)
GFR calc non Af Amer: 64 mL/min/{1.73_m2} (ref >59)
Globulin, Total: 2.3 g/dL (ref 1.5–4.5)
Glucose: 87 mg/dL (ref 65–99)
Potassium: 4.1 mmol/L (ref 3.5–5.2)
Sodium: 144 mmol/L (ref 134–144)
Total Protein: 6.5 g/dL (ref 6.0–8.5)

## 2020-06-24 ENCOUNTER — Other Ambulatory Visit: Payer: Self-pay

## 2020-06-24 ENCOUNTER — Ambulatory Visit (INDEPENDENT_AMBULATORY_CARE_PROVIDER_SITE_OTHER): Payer: Medicare HMO | Admitting: Physical Therapy

## 2020-06-24 ENCOUNTER — Encounter: Payer: Self-pay | Admitting: Physical Therapy

## 2020-06-24 DIAGNOSIS — M5441 Lumbago with sciatica, right side: Secondary | ICD-10-CM

## 2020-06-24 DIAGNOSIS — M62838 Other muscle spasm: Secondary | ICD-10-CM

## 2020-06-24 DIAGNOSIS — G8929 Other chronic pain: Secondary | ICD-10-CM

## 2020-06-24 DIAGNOSIS — M6281 Muscle weakness (generalized): Secondary | ICD-10-CM

## 2020-06-24 NOTE — Therapy (Addendum)
[place] [address] [place], [place], [address] Phone: [phone]   Fax:  [phone]  Physical Therapy Treatment and Discharge  Patient Details  Name: Robin Christensen MRN: [id number] Date of Birth: 10/04/1950 Referring Provider (PT): Dr London Pepper  Progress Note Reporting Period 04/22/20  to 06/24/20  See note below for Objective Data and Assessment of Progress/Goals.      Patient Active Problem List   Diagnosis Date Noted  . Other fatigue increased 06/01/2020  . Atrial fibrillation ([place]) 04/23/2020  . Vitamin D deficiency 04/08/2020  . Hyperglycemia 03/12/2020  . Essential hypertension 03/12/2020  . Mixed hyperlipidemia 01/14/2020  . Lower extremity edema 01/14/2020  . Depression 01/14/2020  . Class 1 obesity with serious comorbidity and body mass index (BMI) of 34.0 to 34.9 in adult 01/14/2020  . Primary osteoarthritis of left knee 08/14/2018  . Primary localized osteoarthritis of right knee 05/03/2016  . Primary osteoarthritis of right knee 05/03/2016  . Abnormal auditory perception of both ears 12/01/2015    Encounter Date: 06/24/2020   PT End of Session - 06/24/20 1241    Visit Number 10    Number of Visits 16    Date for PT Re-Evaluation 07/20/20    Authorization Type [employer] M[employer]    Authorization - Visit Number 10    Authorization - Number of Visits 14    PT Start Time 1104    PT Stop Time 1142    PT Time Calculation (min) 38 min    Activity Tolerance Patient tolerated treatment well    Behavior During Therapy WFL for tasks assessed/performed           Past Medical History:  Diagnosis Date  . A-fib ([place])   . B12 deficiency   . Back pain   . Breast cancer ([place])   . Cancer [facility])    Right Breast Cancer  . Chronic headaches   . Dysrhythmia    AF  . Edema, lower extremity   . Gallbladder disease   . GERD (gastroesophageal reflux disease)   . Hyperlipidemia   . Hypertension   .  Hypothyroidism   . Joint pain   . Obesity   . Pneumonia   . PONV (postoperative nausea and vomiting)   . Sleep apnea    cpcap  . Vitamin deficiency     Past Surgical History:  Procedure Laterality Date  . BREAST SURGERY    . CARDIOVERSION N/A 06/26/2014   Procedure: CARDIOVERSION;  Surgeon: Laverda Page, MD;  Location: [facility];  Service: Cardiovascular;  Laterality: N/A;  . CATARACT EXTRACTION    . CHOLECYSTECTOMY    . DILATION AND CURETTAGE OF UTERUS    . eyelid surgery    . TOTAL KNEE ARTHROPLASTY Right 05/03/2016   Procedure: TOTAL KNEE ARTHROPLASTY;  Surgeon: Melrose Nakayama, MD;  Location: [facility];  Service: Orthopedics;  Laterality: Right;  . TOTAL KNEE ARTHROPLASTY Left 08/14/2018   Procedure: TOTAL KNEE ARTHROPLASTY;  Surgeon: Melrose Nakayama, MD;  Location: [facility];  Service: Orthopedics;  Laterality: Left;    There were no vitals filed for this visit.   Subjective Assessment - 06/24/20 1106    Subjective HEP is going well, I feel a burn afterwards but it goes away. I'm a lot better than when I started here, but sleeping is still a problem for me. I wake up after about 4 hours with a lot of pain, I sleep on my left side with a pillow between  my legs. Doing a lot of bending brings on the pain too.    Diagnostic tests MRI and xrays    Patient Stated Goals get 50-60% better with less pain    Currently in Pain? Yes    Pain Score 1     Pain Location Buttocks    Pain Orientation Right    Pain Descriptors / Indicators Burning    Pain Type Chronic pain    Pain Radiating Towards just in buttcheek    Pain Onset More than a month ago    Pain Frequency Intermittent    Aggravating Factors  doing a lot of bending, picking up things that are too heavy, sleeping    Pain Relieving Factors walking (to a certain extent- an hour makes it worse)    Multiple Pain Sites No              [facility] PT Assessment - 06/24/20 0001      Assessment   Medical Diagnosis Sciatic pain      Referring Provider (PT) Dr London Pepper    Onset Date/Surgical Date 04/23/19    Hand Dominance Right    Next MD Visit PRN      Precautions   Precautions None      Restrictions   Weight Bearing Restrictions No      Balance Screen   Has the patient fallen in the past 6 months No    Has the patient had a decrease in activity level because of a fear of falling?  No    Is the patient reluctant to leave their home because of a fear of falling?  No      Home Ecologist residence      Prior Function   Level of Independence Independent      AROM   Lumbar Flexion WNL; slight hamstring tightness B     Lumbar Extension mild limitation    Lumbar - Right Side Bend moderate limitation     Lumbar - Left Side Bend moderate limitation     Lumbar - Right Rotation WNL     Lumbar - Left Rotation WNL      Strength   Right Hip Flexion 4/5    Right Hip Extension 3/5    Right Hip ABduction 4+/5    Left Hip Flexion 4-/5    Left Hip Extension 3/5    Left Hip ABduction 4+/5    Right Knee Flexion 5/5    Right Knee Extension 5/5    Left Knee Flexion 5/5    Left Knee Extension 5/5      Palpation   Palpation comment lumbar PAs hypomobile and L3 level very tender; no significant muscle knots noted                          [facility] Adult PT Treatment/Exercise - 06/24/20 0001      Lumbar Exercises: Standing   Other Standing Lumbar Exercises deadlifts from elevated surface 5# KB x10     Other Standing Lumbar Exercises hip hikes x10 B       Lumbar Exercises: Supine   Pelvic Tilt 10 reps    Pelvic Tilt Limitations 3 second holds, flattening spine only                   PT Education - 06/24/20 1240    Education Details benefits of advanced core strengthening and weight lifting, holding PT until after  the holidays to see how HEP holds up    Person(s) Educated Patient    Methods Explanation;Demonstration    Comprehension Verbalized  understanding;Returned demonstration            PT Short Term Goals - 06/24/20 1122      PT SHORT TERM GOAL #1   Title independent with initial HEP    Status Achieved             PT Long Term Goals - 06/24/20 1122      PT LONG TERM GOAL #1   Title independent with HEP for core strengthening ( 05/20/2020)    Time 4    Period Weeks    Status Achieved      PT LONG TERM GOAL #2   Title FOTO score improved to </= 42% limited for improved function  ( 05/20/2020)    Time 4    Period Weeks    Status On-going      PT LONG TERM GOAL #3   Title report pain < 2/10 for improved function and mobility ( 05/20/2020)    Baseline pain in last week has been 2/10, still having quite a bit of pain at night    Time 4    Period Weeks    Status Partially Met      PT LONG TERM GOAL #4   Title The patient will be able to sit at computer x 30 minutes without increased R sided low back pain.    Baseline 25-30 minutes but hten has to get up    Time 4    Period Weeks    Status Achieved      PT LONG TERM GOAL #5   Title increase Rt hip and low back strength to assist with decreasing pain with sleeping    Baseline strength has improved but night pain still present    Time 4    Period Weeks    Status Partially Met                 Plan - 06/24/20 1242    Clinical Impression Statement Ms. Sookdeo arrives today reporting that she is feeling much better. Examination still reveals some functional weakness, but improved since initial evaluation; she reports she can do most of what she needs and wants to do but is still having some trouble sleeping at night. She does continue to have some impairments in functional biomechanics as well as general core and trunk/hip extensor strength along with gross deconditioning and might benefit from general advanced strengthening program. At this time since she is feeling so much better, she requests we hold PT over the holidays to see how she does with just  her HEP, and will plan to call us after the new year if her pain worsens.    Personal Factors and Comorbidities Comorbidity 3+    Comorbidities see snap chat    Examination-Activity Limitations Transfers;Other;Bend;Sit;Squat    Examination-Participation Restrictions Yard Work;Shop;Driving;Other    Stability/Clinical Decision Making Stable/Uncomplicated    Clinical Decision Making Low    Rehab Potential Good    PT Frequency --   on hold   PT Duration --   on hold   PT Treatment/Interventions Iontophoresis 65m/ml Dexamethasone;Taping;Patient/family education;Functional mobility training;Moist Heat;Traction;Therapeutic activities;Passive range of motion;Therapeutic exercise;Cryotherapy;Electrical Stimulation;Manual techniques;Dry needling;Spinal Manipulations    PT Next Visit Plan PT on hold to see how HEP/independent management holds up    PT Home Exercise Plan [id number]    Consulted and Agree  with Plan of Care Patient           Patient will benefit from skilled therapeutic intervention in order to improve the following deficits and impairments:  Decreased range of motion, Obesity, Increased muscle spasms, Pain, Impaired flexibility, Decreased strength  Visit Diagnosis: Muscle weakness (generalized)  Other muscle spasm  Chronic right-sided low back pain with right-sided sciatica     Problem List Patient Active Problem List   Diagnosis Date Noted  . Other fatigue increased 06/01/2020  . Atrial fibrillation ([place]) 04/23/2020  . Vitamin D deficiency 04/08/2020  . Hyperglycemia 03/12/2020  . Essential hypertension 03/12/2020  . Mixed hyperlipidemia 01/14/2020  . Lower extremity edema 01/14/2020  . Depression 01/14/2020  . Class 1 obesity with serious comorbidity and body mass index (BMI) of 34.0 to 34.9 in adult 01/14/2020  . Primary osteoarthritis of left knee 08/14/2018  . Primary localized osteoarthritis of right knee 05/03/2016  . Primary osteoarthritis of right knee  05/03/2016  . Abnormal auditory perception of both ears 12/01/2015   PHYSICAL THERAPY DISCHARGE SUMMARY  Visits from Start of Care: 10  Current functional level related to goals / functional outcomes: Goals are partially met   Remaining deficits: See above   Education / Equipment: HEP Plan: Patient agrees to discharge.  Patient goals were partially met. Patient is being discharged due to not returning since the last visit.  ?????    DONAWERTH,KAREN, [place] PT, DPT, PN1   Supplemental Physical Therapist [facility]    Pager [phone] Acute Rehab Office [facility] [place], [place], [address] Phone: [phone]   Fax:  [phone]  Name: Robin Christensen MRN: [id number] Date of Birth: [birth year]

## 2020-07-16 ENCOUNTER — Encounter (INDEPENDENT_AMBULATORY_CARE_PROVIDER_SITE_OTHER): Payer: Self-pay | Admitting: Adult Health

## 2020-07-20 ENCOUNTER — Ambulatory Visit (INDEPENDENT_AMBULATORY_CARE_PROVIDER_SITE_OTHER): Payer: Medicare HMO | Admitting: Adult Health

## 2020-07-22 ENCOUNTER — Ambulatory Visit (INDEPENDENT_AMBULATORY_CARE_PROVIDER_SITE_OTHER): Payer: Medicare HMO | Admitting: Adult Health

## 2020-07-27 ENCOUNTER — Other Ambulatory Visit (INDEPENDENT_AMBULATORY_CARE_PROVIDER_SITE_OTHER): Payer: Self-pay | Admitting: Adult Health

## 2020-07-27 DIAGNOSIS — F3289 Other specified depressive episodes: Secondary | ICD-10-CM

## 2020-07-30 ENCOUNTER — Ambulatory Visit (INDEPENDENT_AMBULATORY_CARE_PROVIDER_SITE_OTHER): Payer: Medicare HMO | Admitting: Adult Health

## 2020-08-12 DIAGNOSIS — M5416 Radiculopathy, lumbar region: Secondary | ICD-10-CM | POA: Diagnosis not present

## 2020-08-18 DIAGNOSIS — Z20822 Contact with and (suspected) exposure to covid-19: Secondary | ICD-10-CM | POA: Diagnosis not present

## 2020-09-21 DIAGNOSIS — J019 Acute sinusitis, unspecified: Secondary | ICD-10-CM | POA: Diagnosis not present

## 2020-10-05 DIAGNOSIS — I4891 Unspecified atrial fibrillation: Secondary | ICD-10-CM | POA: Diagnosis not present

## 2020-10-05 DIAGNOSIS — E559 Vitamin D deficiency, unspecified: Secondary | ICD-10-CM | POA: Diagnosis not present

## 2020-10-05 DIAGNOSIS — R739 Hyperglycemia, unspecified: Secondary | ICD-10-CM | POA: Diagnosis not present

## 2020-10-05 DIAGNOSIS — G4733 Obstructive sleep apnea (adult) (pediatric): Secondary | ICD-10-CM | POA: Diagnosis not present

## 2020-10-05 DIAGNOSIS — E039 Hypothyroidism, unspecified: Secondary | ICD-10-CM | POA: Diagnosis not present

## 2020-10-05 DIAGNOSIS — E538 Deficiency of other specified B group vitamins: Secondary | ICD-10-CM | POA: Diagnosis not present

## 2020-10-05 DIAGNOSIS — E785 Hyperlipidemia, unspecified: Secondary | ICD-10-CM | POA: Diagnosis not present

## 2020-10-05 DIAGNOSIS — I10 Essential (primary) hypertension: Secondary | ICD-10-CM | POA: Diagnosis not present

## 2020-11-17 DIAGNOSIS — M5416 Radiculopathy, lumbar region: Secondary | ICD-10-CM | POA: Diagnosis not present

## 2020-12-09 DIAGNOSIS — G4733 Obstructive sleep apnea (adult) (pediatric): Secondary | ICD-10-CM | POA: Diagnosis not present

## 2020-12-15 DIAGNOSIS — M48061 Spinal stenosis, lumbar region without neurogenic claudication: Secondary | ICD-10-CM | POA: Diagnosis not present

## 2020-12-22 DIAGNOSIS — M545 Low back pain, unspecified: Secondary | ICD-10-CM | POA: Diagnosis not present

## 2020-12-29 ENCOUNTER — Telehealth: Payer: Self-pay | Admitting: Neurology

## 2020-12-29 NOTE — Telephone Encounter (Signed)
Advised Patient it was a certain lot number or batch that was recalled.   She should be able to refill it and not get the the batch that was recalled.

## 2020-12-29 NOTE — Telephone Encounter (Signed)
Patient called and said she heard Zonisamide was recalled and would like a cal back. Would like her med's to be changed.

## 2021-01-05 DIAGNOSIS — R197 Diarrhea, unspecified: Secondary | ICD-10-CM | POA: Diagnosis not present

## 2021-01-05 DIAGNOSIS — R1010 Upper abdominal pain, unspecified: Secondary | ICD-10-CM | POA: Diagnosis not present

## 2021-01-05 DIAGNOSIS — R609 Edema, unspecified: Secondary | ICD-10-CM | POA: Diagnosis not present

## 2021-01-06 DIAGNOSIS — R197 Diarrhea, unspecified: Secondary | ICD-10-CM | POA: Diagnosis not present

## 2021-01-08 DIAGNOSIS — M5416 Radiculopathy, lumbar region: Secondary | ICD-10-CM | POA: Diagnosis not present

## 2021-01-25 ENCOUNTER — Other Ambulatory Visit: Payer: Self-pay | Admitting: Family Medicine

## 2021-01-25 NOTE — Progress Notes (Deleted)
NEUROLOGY FOLLOW UP OFFICE NOTE  Robin Christensen [id number]  Assessment/Plan:   Tension-type headache, not intractable  Prevention:  zonisamide 50mg  QD Rescue:  Extra-strength Tylenol with coffee Limit use of pain relievers to no more than 2 days out of week to prevent risk of rebound or medication-overuse headache. Keep headache diary Follow up ***  Subjective:  Robin Christensen is a 70 year old right-handed female with hypothyroidism, hyperlipidemia, hypertension, OSA, atrial fibrillation status post cardioversion and history of breast cancer status post mastectomy who follows up for headache.   UPDATE: Intensity:  Moderate Duration:  May last for hours Frequency:   Maybe 1 a month on average. Current NSAIDS: None Current analgesics: Tylenol Extra-sength Current triptans: None Current ergotamine: None Current anti-emetic: None Current muscle relaxants: None Current anti-anxiolytic: None Current sleep aide: None Current Antihypertensive medications: Lasix Current Antidepressant medications: none Current Anticonvulsant medications: zonisamide 50mg  Current anti-CGRP: None Current Vitamins/Herbal/Supplements: None Current Antihistamines/Decongestants: None Other therapy: None   Caffeine: No Alcohol: No Smoker: No Diet: Hydrates Exercise: Walks 3 miles 5 days a week Depression: No; Anxiety: No Other pain: Mild neck pain Sleep hygiene: Sleeps 4 to 5 hours straight, wakes up for 2 hours and then sleeps for another 2 hours.  She has OSA which is successfully treated with CPAP.   She was advised to follow up with her dentist to evaluate for TMJ dysfunction.  She hasn't had a chance to go to the walker.   HISTORY: Onset:  2018.  At the time, they were severe and daily.  It was found that her OSA was uncontrolled and CPAP was adjusted.  Headaches resolved but returned about 2 months ago, albeit less severe.  CPAP was rechecked and is adequate. Location:   Bifrontal/across top of head Quality:  pressure Initial intensity:  mild.  She denies new headache, thunderclap headache or severe headache that wakes her from sleep. Aura:  no Prodrome:  no Postdrome:  no Associated symptoms:  None. Initially some nausea a year ago.  Now without nausea, vomiting, photophobia, phonophobia, autonomic symptoms or visual disturbance.  She denies associated unilateral numbness or weakness. Initial Duration:  1 hour to all day Initial Frequency:  3 days a week Initial Frequency of abortive medication: Extra-strength Tylenol 3 days a week Triggers:  Emotional stress Relieving factors:  When occupied by other activities Activity:  Does not aggravate She endorses aching pain in her jaw, right worse than left.  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  She hasn't seen a dentist in years.  She has seen ophthalmology and had cataract surgery this year.  Vision is improved.     MRI of brain without and with contrast from 01/15/18 was personally reviewed and was unremarkable except for incidental partial empty sella.   Past NSAIDS: ibuprofen, naproxen Past analgesics:  no Past abortive triptans:  no Past muscle relaxants:  no Past anti-emetic:  no Past antihypertensive medications:  Toprol XL Past antidepressant medications:  Nortriptyline 10mg  (hunger/weight gain, sluggishness), Cymbalta 60mg  Past anticonvulsant medications:   Topiramate 25 mg at bedtime (discontinued due to cognitive deficits) Past vitamins/Herbal/Supplements:  no Past antihistamines/decongestants:  no Other past therapies:  no   She has history of headaches off and on throughout her life. Family history of headache:  No   In August 2019, she endorsed cognitive changes.  She would repeat things to other people after just 15 minutes.  Another time, she briefly thought her grandchildren were in school but  they were out for the summer.  She did recognize the refrigerator repair  man.  She was started on B12 supplementation.  Due to these changes, topiramate was discontinued and she was started on gabapentin.  However, she is now not on gabapentin.  She is currently on Cymbalta.  Sleep study was okay.  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  She has a follow up appointment in a couple of weeks. Workup is ongoing.    PAST MEDICAL HISTORY: Past Medical History:  Diagnosis Date   A-fib ([place])    B12 deficiency    Back pain    Breast cancer ([place])    Cancer ([facility])    Right Breast Cancer   Chronic headaches    Dysrhythmia    AF   Edema, lower extremity    Gallbladder disease    GERD (gastroesophageal reflux disease)    Hyperlipidemia    Hypertension    Hypothyroidism    Joint pain    Obesity    Pneumonia    PONV (postoperative nausea and vomiting)    Sleep apnea    cpcap   Vitamin deficiency     MEDICATIONS: Current Outpatient Medications on File Prior to Visit  Medication Sig Dispense Refill   acetaminophen (TYLENOL) 500 MG tablet Take 500 mg by mouth 3 (three) times daily as needed for mild pain or headache.      buPROPion (WELLBUTRIN SR) 150 MG 12 hr tablet Take 1 tablet (150 mg total) by mouth daily. 30 tablet 0   CALCIUM-VITAMIN D PO Take 2 tablets by mouth every evening.     diphenhydrAMINE (BENADRYL) 50 MG capsule Take 50 mg by mouth every 6 (six) hours as needed.     ELIQUIS 5 MG TABS tablet TAKE 1 TABLET TWICE DAILY 180 tablet 0   furosemide (LASIX) 40 MG tablet Take 40 mg by mouth daily.     gabapentin (NEURONTIN) 300 MG capsule Take 300 mg by mouth 3 (three) times daily.     levothyroxine (SYNTHROID, LEVOTHROID) 75 MCG tablet Take 75 mcg by mouth daily before breakfast.     mirabegron ER (MYRBETRIQ) 50 MG TB24 tablet Take 50 mg by mouth daily.     potassium chloride SA (K-DUR,KLOR-CON) 20 MEQ tablet Take 20 mEq by mouth daily.      pravastatin (PRAVACHOL) 40 MG tablet Take 40 mg by mouth daily.       vitamin B-12 (CYANOCOBALAMIN) 1000 MCG tablet Take 1,000 mcg by mouth daily.     zonisamide (ZONEGRAN) 50 MG capsule Take 1 capsule (50 mg total) by mouth daily. 90 capsule 3   No current facility-administered medications on file prior to visit.    ALLERGIES: Allergies  Allergen Reactions   Xarelto [Rivaroxaban] Other (See Comments)    JOINT ACHES/PAIN    FAMILY HISTORY: Family History  Problem Relation Age of Onset   High blood pressure Mother    Heart disease Mother    Cancer Mother    Depression Mother    Obesity Mother    Heart disease Father    Kidney disease Father    Cancer Father       Objective:  *** General: No acute distress.  Patient appears well-groomed.   Head:  Normocephalic/atraumatic Eyes:  Fundi examined but not visualized Neck: supple, no paraspinal tenderness, full range of motion Heart:  Regular rate and rhythm Lungs:  Clear to auscultation bilaterally Back: No paraspinal tenderness Neurological  Exam: alert and oriented to person, place, and time.  Speech fluent and not dysarthric, language intact.  CN II-XII intact. Bulk and tone normal, muscle strength 5/5 throughout.  Sensation to light touch intact.  Deep tendon reflexes 2+ throughout, toes downgoing.  Finger to nose testing intact.  Gait normal, Romberg negative.   Metta Clines, DO  CC: London Pepper, MD

## 2021-01-26 ENCOUNTER — Ambulatory Visit: Payer: Medicare HMO | Admitting: Neurology

## 2021-01-26 ENCOUNTER — Other Ambulatory Visit: Payer: Self-pay | Admitting: Family Medicine

## 2021-01-26 DIAGNOSIS — R197 Diarrhea, unspecified: Secondary | ICD-10-CM

## 2021-01-26 DIAGNOSIS — I4891 Unspecified atrial fibrillation: Secondary | ICD-10-CM | POA: Diagnosis not present

## 2021-01-26 DIAGNOSIS — D6869 Other thrombophilia: Secondary | ICD-10-CM | POA: Diagnosis not present

## 2021-01-26 DIAGNOSIS — R1010 Upper abdominal pain, unspecified: Secondary | ICD-10-CM

## 2021-01-26 DIAGNOSIS — U071 COVID-19: Secondary | ICD-10-CM | POA: Diagnosis not present

## 2021-02-08 ENCOUNTER — Other Ambulatory Visit: Payer: Self-pay | Admitting: Neurology

## 2021-02-15 ENCOUNTER — Ambulatory Visit
Admission: RE | Admit: 2021-02-15 | Discharge: 2021-02-15 | Disposition: A | Discharge status: Home / Self Care | Payer: Medicare HMO | Source: Ambulatory Visit | Attending: Family Medicine | Admitting: Family Medicine

## 2021-02-15 ENCOUNTER — Other Ambulatory Visit: Payer: Self-pay

## 2021-02-15 DIAGNOSIS — I700 Atherosclerosis of aorta: Secondary | ICD-10-CM | POA: Diagnosis not present

## 2021-02-15 DIAGNOSIS — R197 Diarrhea, unspecified: Secondary | ICD-10-CM | POA: Diagnosis not present

## 2021-02-15 DIAGNOSIS — R1010 Upper abdominal pain, unspecified: Secondary | ICD-10-CM

## 2021-02-15 DIAGNOSIS — Z9049 Acquired absence of other specified parts of digestive tract: Secondary | ICD-10-CM | POA: Diagnosis not present

## 2021-02-15 DIAGNOSIS — R1013 Epigastric pain: Secondary | ICD-10-CM | POA: Diagnosis not present

## 2021-02-15 MED ORDER — IOPAMIDOL (ISOVUE-300) INJECTION 61%
100.0000 mL | Freq: Once | INTRAVENOUS | Status: AC | PRN
  Administered 2021-02-15: 100 mL via INTRAVENOUS

## 2021-02-24 DIAGNOSIS — M5416 Radiculopathy, lumbar region: Secondary | ICD-10-CM | POA: Diagnosis not present

## 2021-03-19 DIAGNOSIS — M5416 Radiculopathy, lumbar region: Secondary | ICD-10-CM | POA: Diagnosis not present

## 2021-03-24 DIAGNOSIS — N3946 Mixed incontinence: Secondary | ICD-10-CM | POA: Diagnosis not present

## 2021-04-08 DIAGNOSIS — Z1231 Encounter for screening mammogram for malignant neoplasm of breast: Secondary | ICD-10-CM | POA: Diagnosis not present

## 2021-04-13 NOTE — Progress Notes (Signed)
NEUROLOGY FOLLOW UP OFFICE NOTE  Robin Christensen [id number]  Assessment/Plan:   Episodic tension-type headache, not intractable  Headache prevention:  zonisamide 50mg  daily Headache rescue:  Extra-strength Tylenol with coffee Limit use of pain relievers to no more than 2 days out of week to prevent risk of rebound or medication-overuse headache. Keep headache diary Follow up one year   Subjective:  Robin Christensen is a 70 year old right-handed female with hypothyroidism, hyperlipidemia, hypertension, OSA, atrial fibrillation status post cardioversion and history of breast cancer status post mastectomy who follows up for headache.   UPDATE: Intensity:  Moderate Duration:  May last for hours Frequency:   Maybe 2 a month on average. Current NSAIDS: None Current analgesics: Tylenol Extra-sength Current triptans: None Current ergotamine: None Current anti-emetic: None Current muscle relaxants: None Current anti-anxiolytic: None Current sleep aide: None Current Antihypertensive medications: Lasix Current Antidepressant medications: none Current Anticonvulsant medications: zonisamide 50mg  Current anti-CGRP: None Current Vitamins/Herbal/Supplements: None Current Antihistamines/Decongestants: None Other therapy: None   Caffeine: No Alcohol: No Smoker: No Diet: Hydrates Exercise: Walks 3 miles 5 days a week Depression: No; Anxiety: No Other pain: Lumbar spinal stenosis.   Sleep hygiene: Sleeps 4 to 5 hours straight, wakes up for 2 hours and then sleeps for another 2 hours.  She has OSA which is successfully treated with CPAP.   She was advised to follow up with her dentist to evaluate for TMJ dysfunction.  She hasn't had a chance to go to the walker.   HISTORY: Onset:  2018.  At the time, they were severe and daily.  It was found that her OSA was uncontrolled and CPAP was adjusted.  Headaches resolved but returned about 2 months ago, albeit less severe.  CPAP was  rechecked and is adequate. Location:  Bifrontal/across top of head Quality:  pressure Initial intensity:  mild.  She denies new headache, thunderclap headache or severe headache that wakes her from sleep. Aura:  no Prodrome:  no Postdrome:  no Associated symptoms:  None. Initially some nausea a year ago.  Now without nausea, vomiting, photophobia, phonophobia, autonomic symptoms or visual disturbance.  She denies associated unilateral numbness or weakness. Initial Duration:  1 hour to all day Initial Frequency:  3 days a week Initial Frequency of abortive medication: Extra-strength Tylenol 3 days a week Triggers:  Emotional stress Relieving factors:  When occupied by other activities Activity:  Does not aggravate She endorses aching pain in her jaw, right worse than left.  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  She hasn't seen a dentist in years.  She has seen ophthalmology and had cataract surgery this year.  Vision is improved.     MRI of brain without and with contrast from 01/15/18 was personally reviewed and was unremarkable except for incidental partial empty sella.   Past NSAIDS: ibuprofen, naproxen Past analgesics:  no Past abortive triptans:  no Past muscle relaxants:  no Past anti-emetic:  no Past antihypertensive medications:  Toprol XL Past antidepressant medications:  Nortriptyline 10mg  (hunger/weight gain, sluggishness), Cymbalta 60mg  Past anticonvulsant medications:   Topiramate 25 mg at bedtime (discontinued due to cognitive deficits) Past vitamins/Herbal/Supplements:  no Past antihistamines/decongestants:  no Other past therapies:  no   She has history of headaches off and on throughout her life.  Family history of headache:  No   In August 2019, she endorsed cognitive changes.  She would repeat things to other people after just 15 minutes.  Another time, she  briefly thought her grandchildren were in school but they were out for the summer.  She  did recognize the refrigerator repair man.  She was started on B12 supplementation.  Due to these changes, topiramate was discontinued and she was started on gabapentin.  However, she is now not on gabapentin.  She is currently on Cymbalta.  Sleep study was okay.  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  She has a follow up appointment in a couple of weeks. Workup is ongoing.   PAST MEDICAL HISTORY: Past Medical History:  Diagnosis Date   A-fib ([place])    B12 deficiency    Back pain    Breast cancer ([place])    Cancer ([facility])    Right Breast Cancer   Chronic headaches    Dysrhythmia    AF   Edema, lower extremity    Gallbladder disease    GERD (gastroesophageal reflux disease)    Hyperlipidemia    Hypertension    Hypothyroidism    Joint pain    Obesity    Pneumonia    PONV (postoperative nausea and vomiting)    Sleep apnea    cpcap   Vitamin deficiency     MEDICATIONS: Current Outpatient Medications on File Prior to Visit  Medication Sig Dispense Refill   acetaminophen (TYLENOL) 500 MG tablet Take 500 mg by mouth 3 (three) times daily as needed for mild pain or headache.      buPROPion (WELLBUTRIN SR) 150 MG 12 hr tablet Take 1 tablet (150 mg total) by mouth daily. 30 tablet 0   CALCIUM-VITAMIN D PO Take 2 tablets by mouth every evening.     diphenhydrAMINE (BENADRYL) 50 MG capsule Take 50 mg by mouth every 6 (six) hours as needed.     ELIQUIS 5 MG TABS tablet TAKE 1 TABLET TWICE DAILY 180 tablet 0   furosemide (LASIX) 40 MG tablet Take 40 mg by mouth daily.     gabapentin (NEURONTIN) 300 MG capsule Take 300 mg by mouth 3 (three) times daily.     levothyroxine (SYNTHROID, LEVOTHROID) 75 MCG tablet Take 75 mcg by mouth daily before breakfast.     mirabegron ER (MYRBETRIQ) 50 MG TB24 tablet Take 50 mg by mouth daily.     potassium chloride SA (K-DUR,KLOR-CON) 20 MEQ tablet Take 20 mEq by mouth daily.      pravastatin (PRAVACHOL) 40 MG  tablet Take 40 mg by mouth daily.      vitamin B-12 (CYANOCOBALAMIN) 1000 MCG tablet Take 1,000 mcg by mouth daily.     zonisamide (ZONEGRAN) 50 MG capsule Take 1 capsule by mouth once daily 90 capsule 0   No current facility-administered medications on file prior to visit.    ALLERGIES: Allergies  Allergen Reactions   Xarelto [Rivaroxaban] Other (See Comments)    JOINT ACHES/PAIN    FAMILY HISTORY: Family History  Problem Relation Age of Onset   High blood pressure Mother    Heart disease Mother    Cancer Mother    Depression Mother    Obesity Mother    Heart disease Father    Kidney disease Father    Cancer Father       Objective:  Blood pressure 122/76, pulse 69, height 5\' 8"  (1.727 m), weight 298 lb 9.6 oz (135.4 kg), SpO2 96 %. General: No acute distress.  Patient appears well-groomed.   Head:  Normocephalic/atraumatic Eyes:  Fundi examined but not visualized Neck: supple,  no paraspinal tenderness, full range of motion Heart:  Regular rate and rhythm Lungs:  Clear to auscultation bilaterally Back: No paraspinal tenderness Neurological Exam: alert and oriented to person, place, and time.  Speech fluent and not dysarthric, language intact.  CN II-XII intact. Bulk and tone normal, muscle strength 5/5 throughout.  Sensation to light touch intact.  Deep tendon reflexes 2+ throughout, toes downgoing.  Finger to nose testing intact.  Gait normal, Romberg negative.   Metta Clines, DO  CC: London Pepper, MD

## 2021-04-14 ENCOUNTER — Encounter: Payer: Self-pay | Admitting: Neurology

## 2021-04-14 ENCOUNTER — Other Ambulatory Visit: Payer: Self-pay

## 2021-04-14 ENCOUNTER — Ambulatory Visit: Payer: Medicare HMO | Admitting: Neurology

## 2021-04-14 VITALS — BP 122/76 | HR 69 | Ht 68.0 in | Wt 298.6 lb

## 2021-04-14 DIAGNOSIS — G44219 Episodic tension-type headache, not intractable: Secondary | ICD-10-CM

## 2021-04-21 DIAGNOSIS — E559 Vitamin D deficiency, unspecified: Secondary | ICD-10-CM | POA: Diagnosis not present

## 2021-04-21 DIAGNOSIS — E039 Hypothyroidism, unspecified: Secondary | ICD-10-CM | POA: Diagnosis not present

## 2021-04-21 DIAGNOSIS — Z23 Encounter for immunization: Secondary | ICD-10-CM | POA: Diagnosis not present

## 2021-04-21 DIAGNOSIS — E538 Deficiency of other specified B group vitamins: Secondary | ICD-10-CM | POA: Diagnosis not present

## 2021-04-21 DIAGNOSIS — I10 Essential (primary) hypertension: Secondary | ICD-10-CM | POA: Diagnosis not present

## 2021-04-21 DIAGNOSIS — I4891 Unspecified atrial fibrillation: Secondary | ICD-10-CM | POA: Diagnosis not present

## 2021-04-21 DIAGNOSIS — D6869 Other thrombophilia: Secondary | ICD-10-CM | POA: Diagnosis not present

## 2021-04-21 DIAGNOSIS — Z0000 Encounter for general adult medical examination without abnormal findings: Secondary | ICD-10-CM | POA: Diagnosis not present

## 2021-04-21 DIAGNOSIS — E785 Hyperlipidemia, unspecified: Secondary | ICD-10-CM | POA: Diagnosis not present

## 2021-05-05 DIAGNOSIS — M5416 Radiculopathy, lumbar region: Secondary | ICD-10-CM | POA: Diagnosis not present

## 2021-05-06 DIAGNOSIS — I10 Essential (primary) hypertension: Secondary | ICD-10-CM | POA: Diagnosis not present

## 2021-05-06 DIAGNOSIS — I4891 Unspecified atrial fibrillation: Secondary | ICD-10-CM | POA: Diagnosis not present

## 2021-05-06 DIAGNOSIS — E785 Hyperlipidemia, unspecified: Secondary | ICD-10-CM | POA: Diagnosis not present

## 2021-05-06 DIAGNOSIS — E039 Hypothyroidism, unspecified: Secondary | ICD-10-CM | POA: Diagnosis not present

## 2021-05-08 DIAGNOSIS — G4733 Obstructive sleep apnea (adult) (pediatric): Secondary | ICD-10-CM | POA: Diagnosis not present

## 2021-05-20 DIAGNOSIS — N3946 Mixed incontinence: Secondary | ICD-10-CM | POA: Diagnosis not present

## 2021-05-21 DIAGNOSIS — H524 Presbyopia: Secondary | ICD-10-CM | POA: Diagnosis not present

## 2021-05-21 DIAGNOSIS — Z961 Presence of intraocular lens: Secondary | ICD-10-CM | POA: Diagnosis not present

## 2021-05-31 DIAGNOSIS — M5416 Radiculopathy, lumbar region: Secondary | ICD-10-CM | POA: Diagnosis not present

## 2021-06-07 DIAGNOSIS — M545 Low back pain, unspecified: Secondary | ICD-10-CM | POA: Diagnosis not present

## 2021-06-14 DIAGNOSIS — E039 Hypothyroidism, unspecified: Secondary | ICD-10-CM | POA: Diagnosis not present

## 2021-06-14 DIAGNOSIS — I10 Essential (primary) hypertension: Secondary | ICD-10-CM | POA: Diagnosis not present

## 2021-06-14 DIAGNOSIS — G5701 Lesion of sciatic nerve, right lower limb: Secondary | ICD-10-CM | POA: Diagnosis not present

## 2021-06-14 DIAGNOSIS — E785 Hyperlipidemia, unspecified: Secondary | ICD-10-CM | POA: Diagnosis not present

## 2021-06-14 DIAGNOSIS — I4891 Unspecified atrial fibrillation: Secondary | ICD-10-CM | POA: Diagnosis not present

## 2021-06-30 DIAGNOSIS — I4891 Unspecified atrial fibrillation: Secondary | ICD-10-CM | POA: Diagnosis not present

## 2021-06-30 DIAGNOSIS — E039 Hypothyroidism, unspecified: Secondary | ICD-10-CM | POA: Diagnosis not present

## 2021-06-30 DIAGNOSIS — E785 Hyperlipidemia, unspecified: Secondary | ICD-10-CM | POA: Diagnosis not present

## 2021-06-30 DIAGNOSIS — I10 Essential (primary) hypertension: Secondary | ICD-10-CM | POA: Diagnosis not present

## 2021-07-01 DIAGNOSIS — G5701 Lesion of sciatic nerve, right lower limb: Secondary | ICD-10-CM | POA: Diagnosis not present

## 2021-07-01 DIAGNOSIS — G5601 Carpal tunnel syndrome, right upper limb: Secondary | ICD-10-CM | POA: Diagnosis not present

## 2021-07-20 DIAGNOSIS — M545 Low back pain, unspecified: Secondary | ICD-10-CM | POA: Diagnosis not present

## 2021-07-22 DIAGNOSIS — I4891 Unspecified atrial fibrillation: Secondary | ICD-10-CM | POA: Diagnosis not present

## 2021-07-22 DIAGNOSIS — E039 Hypothyroidism, unspecified: Secondary | ICD-10-CM | POA: Diagnosis not present

## 2021-07-22 DIAGNOSIS — I10 Essential (primary) hypertension: Secondary | ICD-10-CM | POA: Diagnosis not present

## 2021-07-22 DIAGNOSIS — E785 Hyperlipidemia, unspecified: Secondary | ICD-10-CM | POA: Diagnosis not present

## 2021-07-25 ENCOUNTER — Other Ambulatory Visit: Payer: Self-pay | Admitting: Neurology

## 2021-07-30 DIAGNOSIS — G4733 Obstructive sleep apnea (adult) (pediatric): Secondary | ICD-10-CM | POA: Diagnosis not present

## 2021-08-05 DIAGNOSIS — G5701 Lesion of sciatic nerve, right lower limb: Secondary | ICD-10-CM | POA: Diagnosis not present

## 2021-08-09 ENCOUNTER — Other Ambulatory Visit: Payer: Self-pay

## 2021-08-09 ENCOUNTER — Ambulatory Visit: Payer: Medicare HMO | Attending: Orthopedic Surgery | Admitting: Physical Therapy

## 2021-08-09 DIAGNOSIS — G5701 Lesion of sciatic nerve, right lower limb: Secondary | ICD-10-CM | POA: Diagnosis not present

## 2021-08-09 DIAGNOSIS — M5441 Lumbago with sciatica, right side: Secondary | ICD-10-CM | POA: Diagnosis not present

## 2021-08-09 DIAGNOSIS — M6281 Muscle weakness (generalized): Secondary | ICD-10-CM | POA: Insufficient documentation

## 2021-08-09 DIAGNOSIS — G8929 Other chronic pain: Secondary | ICD-10-CM | POA: Diagnosis not present

## 2021-08-09 DIAGNOSIS — E785 Hyperlipidemia, unspecified: Secondary | ICD-10-CM | POA: Diagnosis not present

## 2021-08-09 DIAGNOSIS — M25651 Stiffness of right hip, not elsewhere classified: Secondary | ICD-10-CM | POA: Diagnosis not present

## 2021-08-09 DIAGNOSIS — I4891 Unspecified atrial fibrillation: Secondary | ICD-10-CM | POA: Diagnosis not present

## 2021-08-09 DIAGNOSIS — M25551 Pain in right hip: Secondary | ICD-10-CM | POA: Insufficient documentation

## 2021-08-09 DIAGNOSIS — I10 Essential (primary) hypertension: Secondary | ICD-10-CM | POA: Diagnosis not present

## 2021-08-09 DIAGNOSIS — M62838 Other muscle spasm: Secondary | ICD-10-CM | POA: Diagnosis not present

## 2021-08-09 DIAGNOSIS — R2689 Other abnormalities of gait and mobility: Secondary | ICD-10-CM | POA: Insufficient documentation

## 2021-08-09 DIAGNOSIS — E039 Hypothyroidism, unspecified: Secondary | ICD-10-CM | POA: Diagnosis not present

## 2021-08-09 NOTE — Patient Instructions (Signed)
Access Code: [email or web address] URL: [email or web address] Date: 08/09/2021 Prepared by: Estill Bamberg April Thurnell Garbe  Exercises Seated Piriformis Stretch - 1 x daily - 7 x weekly - 2 sets - 30 sec hold Standing Hip Flexor Stretch - 1 x daily - 7 x weekly - 2 sets - 30 sec hold Standing 'L' Stretch at Counter - 1 x daily - 7 x weekly - 2 sets - 30 sec hold Hip Abduction with Resistance Loop - 1 x daily - 7 x weekly - 2 sets - 10 reps

## 2021-08-09 NOTE — Therapy (Signed)
[facility] [place], [place], [address] Phone: [phone]   Fax:  [phone]  Physical Therapy Evaluation  Patient Details  Name: Robin Christensen MRN: [id number] Date of Birth: [birth year] Referring Provider (PT): Norva Karvonen   Encounter Date: 08/09/2021   PT End of Session - 08/09/21 0756     Visit Number 1    Number of Visits 12    Date for PT Re-Evaluation 09/20/21    Authorization Type [employer]    Progress Note Due on Visit 10    PT Start Time 0800    PT Stop Time 0845    PT Time Calculation (min) 45 min    Activity Tolerance Patient tolerated treatment well    Behavior During Therapy [facility] for tasks assessed/performed             Past Medical History:  Diagnosis Date   A-fib ([place])    B12 deficiency    Back pain    Breast cancer ([place])    Cancer ([place])    Right Breast Cancer   Chronic headaches    Dysrhythmia    AF   Edema, lower extremity    Gallbladder disease    GERD (gastroesophageal reflux disease)    Hyperlipidemia    Hypertension    Hypothyroidism    Joint pain    Obesity    Pneumonia    PONV (postoperative nausea and vomiting)    Sleep apnea    cpcap   Vitamin deficiency     Past Surgical History:  Procedure Laterality Date   BREAST SURGERY     CARDIOVERSION N/A 06/26/2014   Procedure: CARDIOVERSION;  Surgeon: Laverda Page, MD;  Location: [facility];  Service: Cardiovascular;  Laterality: N/A;   CATARACT EXTRACTION     CHOLECYSTECTOMY     DILATION AND CURETTAGE OF UTERUS     eyelid surgery     TOTAL KNEE ARTHROPLASTY Right 05/03/2016   Procedure: TOTAL KNEE ARTHROPLASTY;  Surgeon: Melrose Nakayama, MD;  Location: [facility];  Service: Orthopedics;  Laterality: Right;   TOTAL KNEE ARTHROPLASTY Left 08/14/2018   Procedure: TOTAL KNEE ARTHROPLASTY;  Surgeon: Mel[place] Nakayama, MD;  Location: [facility];  Service: Orthopedics;  Laterality: Left;    There were no  vitals filed for this visit.    Subjective Assessment - 08/09/21 0803     Subjective Pt reports a chronic back problem. Pt states pain doctor was convinced that it was due to some discs. Surgeon did not feel it was because of the disc but due to piriformis syndrome. Pt states first cortisone shot in that muscle helpd for a couple of weeks. Pt states that she got a second shot and it has not done as well. Pt reports that for months and months she feels like she's sitting on a rock. Notes increased pain with sleeping and sitting. Pt reports sometimes muscle spasms really bad. Pt has done PT in the past. Pt has been doing tennis ball for myofacial release.    Limitations Sitting;Walking;Standing    How long can you sit comfortably? Maybe 20-30 minutes    How long can you stand comfortably? Maybe 20-30 minutes    How long can you walk comfortably? 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 No impingement to explain leg symptoms.    Currently in Pain? Yes    Pain Score 6    at worst 8  or 9/10   Pain Location Buttocks    Pain Orientation Right    Pain Descriptors / Indicators Burning    Pain Type Chronic pain    Pain Radiating Towards Towards right side of hip, can go down to front of thigh. At night she feels it in the back of her leg.    Pain Onset More than a month ago    Pain Frequency Constant    Aggravating Factors  Sleeping, sitting    Pain Relieving Factors Cortisone shot                [facility] PT Assessment - 08/09/21 0001       Assessment   Medical Diagnosis R piriformis syndrome    Referring Provider (PT) Herminio Commons Dumonski    Prior Therapy Back and L knee      Precautions   Precautions None      Balance Screen   Has the patient fallen in the past 6 months No      [place] residence    Living Arrangements Alone    Available Help at Discharge Family    Type of [place] Two level    Alternate Level Stairs-Number of Steps at least 10      Prior [place] Retired      Observation/Other Assessments   Focus on Therapeutic Outcomes ([place])  73 (risk adjusted 55); pred      Posture/Postural Control   Posture/Postural Control Postural limitations    Postural Limitations Increased thoracic kyphosis;Posterior pelvic tilt      ROM / Strength   AROM / PROM / Strength Strength      Strength   Strength Assessment Site Hip;Knee    Right/Left Hip Right;Left    Right Hip Flexion 4-/5    Right Hip Extension 3-/5    Right Hip External Rotation  3/5    Right Hip Internal Rotation 4-/5    Right Hip ABduction 3-/5    Left Hip Flexion 4+/5    Left Hip Extension 3/5    Left Hip External Rotation 4/5    Left Hip Internal Rotation 4/5    Left Hip ABduction 3+/5    Right/Left Knee Right;Left    Right Knee Flexion 4/5    Right Knee Extension 4-/5    Left Knee Flexion 4/5    Left Knee Extension 4/5      Palpation   Spinal mobility pain with lower lumbar and sacral PAs    Palpation comment TTP: R>L glute med, max, piriformis, QL, lumbar paraspinals      Special Tests    Special Tests Lumbar    Lumbar Tests Slump Test;Prone Knee Bend Test;Straight Leg Raise      Slump test   Findings Positive    Side Right      Prone Knee Bend Test   Findings Positive    Side Right      Straight Leg Raise   Findings Positive    Side  Right      Ambulation/Gait   Ambulation Distance (Feet) --   Throughout clinic   Assistive device None    Gait Pattern Step-through pattern;Trendelenburg;Antalgic;Lateral trunk lean to right;Lateral trunk lean to left    Ambulation Surface Level;Indoor                        Objective measurements completed on examination: See  above findings.                PT Education - 08/09/21 0843     Education Details Advised pt to continue on tennis ball myofacial release but discussed also  performing it not just on piriformis but glutes and paraspinals. Discussed exam findings, POC and initial HEP    Person(s) Educated Patient    Methods Explanation;Demonstration;Tactile cues;Verbal cues;Handout    Comprehension Verbalized understanding;Returned demonstration;Verbal cues required;Tactile cues required              PT Short Term Goals - 08/09/21 0918       PT SHORT TERM GOAL #1   Title independent with initial HEP    Time 3    Period Weeks    Status New    Target Date 08/30/21      PT SHORT TERM GOAL #2   Title improve hip strength to at least 3+/5    Time 3    Period Weeks    Status New    Target Date 08/30/21      PT SHORT TERM GOAL #3   Title pt will be able to walk for at least 10 minutes for exercise/increased activity    Time 3    Period Weeks    Status New    Target Date 08/30/21               PT Long Term Goals - 08/09/21 0848       PT LONG TERM GOAL #1   Title independent with HEP for hip and core strengthening    Time 6    Period Weeks    Status New    Target Date 09/20/21      PT LONG TERM GOAL #2   Title FOTO score improved to 59    Baseline 47    Time 6    Period Weeks    Status New    Target Date 09/20/21      PT LONG TERM GOAL #3   Title Pt will report >50% decrease in pain levels    Baseline 8 to 9/10 at worst    Time 6    Period Weeks    Status New    Target Date 09/20/21      PT LONG TERM GOAL #4   Title Pt will be able to increase hip strength to at least 4-/5 for improved stability with standing and walking    Time 6    Period Weeks    Status New    Target Date 09/20/21      PT LONG TERM GOAL #5   Title Pt will report improved sleep by at least 50%    Time 6    Period Weeks    Status New    Target Date 09/20/21                    Plan - 08/09/21 0843     Clinical Impression Statement Robin Christensen is a 71 y/o F presenting to OPPT with complaint of chronic R buttock pain radiating  to lumbar spine, lateral hip and at times to front or back of thigh. On assessment, pt with highly TTP R piriformis, glute, and lumbar paraspinals with gross R > L hip weakness and decreased ROM/tightness. S/S consistent with sciatic and femoral nerve impingement. Pt would highly benefit from PT to address these issues for improved comfort with sleep and mobility.    Personal  Factors and Comorbidities Fitness;Age;Time since onset of injury/illness/exacerbation;Past/Current Experience    Examination-Activity Limitations Locomotion Level;Transfers;Bed Mobility;Stairs;Squat;Stand;Sit;Sleep    Examination-Participation Restrictions Cleaning;Community Activity;Driving;Laundry;Yard Work;Meal Prep;Shop    Stability/Clinical Decision Making Evolving/Moderate complexity    Clinical Decision Making Moderate    Rehab Potential Good    PT Frequency 2x / week    PT Duration 6 weeks    PT Treatment/Interventions ADLs/Self Care Home Management;Aquatic Therapy;Cryotherapy;Electrical Stimulation;Iontophoresis 4mg /ml Dexamethasone;Moist Heat;Traction;Gait training;Stair training;Functional mobility training;Therapeutic activities;Therapeutic exercise;Balance training;Neuromuscular re-education;Manual techniques;Patient/family education;Dry needling;Passive range of motion;Taping    PT Next Visit Plan Assess response to HEP and modify as needed. TPDN/manual therapy for piriformis/glute/lumbar paraspinals. Continue stretching and strengthening.    PT Home Exercise Plan Access Code: [email or web address]    [email or web address] and Agree with Plan of Care Patient             Patient will benefit from skilled therapeutic intervention in order to improve the following deficits and impairments:  Abnormal gait, Decreased range of motion, Difficulty walking, Increased fascial restricitons, Increased muscle spasms, Decreased activity tolerance, Pain, Hypomobility, Decreased mobility, Decreased strength, Postural dysfunction  Visit  Diagnosis: Muscle weakness (generalized)  Other muscle spasm  Chronic right-sided low back pain with right-sided sciatica  Pain in right hip  Stiffness of right hip, not elsewhere classified  Other abnormalities of gait and mobility     Problem List Patient Active Problem List   Diagnosis Date Noted   Other fatigue increased 06/01/2020   Atrial fibrillation ([place]) 04/23/2020   Vitamin D deficiency 04/08/2020   Hyperglycemia 03/12/2020   Essential hypertension 03/12/2020   Mixed hyperlipidemia 01/14/2020   Lower extremity edema 01/14/2020   Depression 01/14/2020   Class 1 obesity with serious comorbidity and body mass index (BMI) of 34.0 to 34.9 in adult 01/14/2020   Primary osteoarthritis of left knee 08/14/2018   Primary localized osteoarthritis of right knee 05/03/2016   Primary osteoarthritis of right knee 05/03/2016   Abnormal auditory perception of both ears 12/01/2015    [facility] April Gordy Levan, PT, DPT 08/09/2021, [facility], [place], [address] Phone: [phone]   Fax:  [phone]  Name: Robin Christensen MRN: [id number] Date of Birth: 05-Jan-1951

## 2021-08-11 ENCOUNTER — Ambulatory Visit: Payer: Medicare HMO | Admitting: Physical Therapy

## 2021-08-11 ENCOUNTER — Other Ambulatory Visit: Payer: Self-pay

## 2021-08-11 DIAGNOSIS — G5701 Lesion of sciatic nerve, right lower limb: Secondary | ICD-10-CM | POA: Diagnosis not present

## 2021-08-11 DIAGNOSIS — M62838 Other muscle spasm: Secondary | ICD-10-CM

## 2021-08-11 DIAGNOSIS — R2689 Other abnormalities of gait and mobility: Secondary | ICD-10-CM | POA: Diagnosis not present

## 2021-08-11 DIAGNOSIS — G8929 Other chronic pain: Secondary | ICD-10-CM | POA: Diagnosis not present

## 2021-08-11 DIAGNOSIS — M6281 Muscle weakness (generalized): Secondary | ICD-10-CM

## 2021-08-11 DIAGNOSIS — M5441 Lumbago with sciatica, right side: Secondary | ICD-10-CM

## 2021-08-11 DIAGNOSIS — M25651 Stiffness of right hip, not elsewhere classified: Secondary | ICD-10-CM | POA: Diagnosis not present

## 2021-08-11 DIAGNOSIS — M25551 Pain in right hip: Secondary | ICD-10-CM | POA: Diagnosis not present

## 2021-08-11 NOTE — Therapy (Signed)
[place], [place], [address] Phone: [phone]   Fax:  [phone]  Physical Therapy Treatment  Patient Details  Name: Robin Christensen MRN: [id number] Date of Birth: September 01, 1950 Referring Provider (PT): Norva Karvonen   Encounter Date: 08/11/2021   PT End of Session - 08/11/21 0853     Visit Number 2    Number of Visits 12    Date for PT Re-Evaluation 09/20/21    Authorization Type [employer]    Authorization - Visit Number 2    Progress Note Due on Visit 10    PT Start Time 0800    PT Stop Time 0845    PT Time Calculation (min) 45 min    Activity Tolerance Patient tolerated treatment well    Behavior During Therapy [facility] for tasks assessed/performed             Past Medical History:  Diagnosis Date   A-fib ([place])    B12 deficiency    Back pain    Breast cancer ([place])    Cancer ([place])    Right Breast Cancer   Chronic headaches    Dysrhythmia    AF   Edema, lower extremity    Gallbladder disease    GERD (gastroesophageal reflux disease)    Hyperlipidemia    Hypertension    Hypothyroidism    Joint pain    Obesity    Pneumonia    PONV (postoperative nausea and vomiting)    Sleep apnea    cpcap   Vitamin deficiency     Past Surgical History:  Procedure Laterality Date   BREAST SURGERY     CARDIOVERSION N/A 06/26/2014   Procedure: CARDIOVERSION;  Surgeon: Laverda Page, MD;  Location: [place];  Service: Cardiovascular;  Laterality: N/A;   CATARACT EXTRACTION     CHOLECYSTECTOMY     DILATION AND CURETTAGE OF UTERUS     eyelid surgery     TOTAL KNEE ARTHROPLASTY Right 05/03/2016   Procedure: TOTAL KNEE ARTHROPLASTY;  Surgeon: Melrose Nakayama, MD;  Location: [place];  Service: Orthopedics;  Laterality: Right;   TOTAL KNEE ARTHROPLASTY Left 08/14/2018   Procedure: TOTAL KNEE ARTHROPLASTY;  Surgeon: Melrose Nakayama, MD;  Location: [place];  Service: Orthopedics;   Laterality: Left;    There were no vitals filed for this visit.   Subjective Assessment - 08/11/21 0806     Subjective Pt states she is more sore in the morning and that "sleeping is almost impossible". She states she usually "loosens up" as the day goes on    Patient Stated Goals decrease pain    Currently in Pain? Yes    Pain Score 6     Pain Location Buttocks    Pain Orientation Right    Pain Descriptors / Indicators Sore    Pain Type Chronic pain                               [facility] Adult PT Treatment/Exercise - 08/11/21 0001       Exercises   Exercises Lumbar      Lumbar Exercises: Stretches   Hip Flexor Stretch 2 reps;30 seconds    Hip Flexor Stretch Limitations standing bilat    Figure 4 Stretch 2 reps;30 seconds;Seated    Figure 4 Stretch Limitations bilat    Other Lumbar Stretch Exercise physioball roll out 5 x 10 seconds each direction  Lumbar Exercises: Aerobic   Nustep L5 x 5 min for warm up      Lumbar Exercises: Standing   Wall Slides 15 reps    Wall Slides Limitations with adduction squeeze      Lumbar Exercises: Sidelying   Clam 15 reps;Right    Clam Limitations then reverse clam x 15      Modalities   Modalities Cryotherapy;Electrical Stimulation      Cryotherapy   Number Minutes Cryotherapy 10 Minutes    Cryotherapy Location --   Rt buttocks   Type of Cryotherapy Ice pack      Electrical Stimulation   Electrical Stimulation Location Rt glute    Electrical Stimulation Action TENS    Electrical Stimulation Parameters to tolerance    Electrical Stimulation Goals Pain      Manual Therapy   Manual Therapy Soft tissue mobilization;Joint mobilization    Joint Mobilization SIJ grade 2-3 CPAs and UPAs to improve mobiltiy    Soft tissue mobilization STM Rt piriformis and glutes                       PT Short Term Goals - 08/09/21 0918       PT SHORT TERM GOAL #1   Title independent with initial HEP    Time  3    Period Weeks    Status New    Target Date 08/30/21      PT SHORT TERM GOAL #2   Title improve hip strength to at least 3+/5    Time 3    Period Weeks    Status New    Target Date 08/30/21      PT SHORT TERM GOAL #3   Title pt will be able to walk for at least 10 minutes for exercise/increased activity    Time 3    Period Weeks    Status New    Target Date 08/30/21               PT Long Term Goals - 08/09/21 0848       PT LONG TERM GOAL #1   Title independent with HEP for hip and core strengthening    Time 6    Period Weeks    Status New    Target Date 09/20/21      PT LONG TERM GOAL #2   Title FOTO score improved to 59    Baseline 47    Time 6    Period Weeks    Status New    Target Date 09/20/21      PT LONG TERM GOAL #3   Title Pt will report >50% decrease in pain levels    Baseline 8 to 9/10 at worst    Time 6    Period Weeks    Status New    Target Date 09/20/21      PT LONG TERM GOAL #4   Title Pt will be able to increase hip strength to at least 4-/5 for improved stability with standing and walking    Time 6    Period Weeks    Status New    Target Date 09/20/21      PT LONG TERM GOAL #5   Title Pt will report improved sleep by at least 50%    Time 6    Period Weeks    Status New    Target Date 09/20/21  Plan - 08/11/21 0854     Clinical Impression Statement Pt very TTP with manual work today but with cues for breathing is able to tolerate gentle jt mobs and STM. Good relief at end of session with TENS and ice    PT Next Visit Plan progress strengthening. manual as tolerated. give info on TENS    PT Home Exercise Plan Access Code: [email or web address]    [email or web address] and Agree with Plan of Care Patient             Patient will benefit from skilled therapeutic intervention in order to improve the following deficits and impairments:     Visit Diagnosis: Muscle weakness (generalized)  Other muscle spasm  Chronic  right-sided low back pain with right-sided sciatica     Problem List Patient Active Problem List   Diagnosis Date Noted   Other fatigue increased 06/01/2020   Atrial fibrillation ([place]) 04/23/2020   Vitamin D deficiency 04/08/2020   Hyperglycemia 03/12/2020   Essential hypertension 03/12/2020   Mixed hyperlipidemia 01/14/2020   Lower extremity edema 01/14/2020   Depression 01/14/2020   Class 1 obesity with serious comorbidity and body mass index (BMI) of 34.0 to 34.9 in adult 01/14/2020   Primary osteoarthritis of left knee 08/14/2018   Primary localized osteoarthritis of right knee 05/03/2016   Primary osteoarthritis of right knee 05/03/2016   Abnormal auditory perception of both ears 12/01/2015    Hikaru Delorenzo, PT 08/11/2021, 8:58 AM  [facility]  [place], [place], [address] Phone: [phone]   Fax:  [phone]  Name: Robin Christensen MRN: [id number] Date of Birth: [birth year]

## 2021-08-16 ENCOUNTER — Ambulatory Visit: Payer: Medicare HMO | Admitting: Physical Therapy

## 2021-08-16 ENCOUNTER — Other Ambulatory Visit: Payer: Self-pay

## 2021-08-16 DIAGNOSIS — G5701 Lesion of sciatic nerve, right lower limb: Secondary | ICD-10-CM | POA: Diagnosis not present

## 2021-08-16 DIAGNOSIS — G8929 Other chronic pain: Secondary | ICD-10-CM | POA: Diagnosis not present

## 2021-08-16 DIAGNOSIS — R2689 Other abnormalities of gait and mobility: Secondary | ICD-10-CM

## 2021-08-16 DIAGNOSIS — M5441 Lumbago with sciatica, right side: Secondary | ICD-10-CM | POA: Diagnosis not present

## 2021-08-16 DIAGNOSIS — M6281 Muscle weakness (generalized): Secondary | ICD-10-CM | POA: Diagnosis not present

## 2021-08-16 DIAGNOSIS — M25551 Pain in right hip: Secondary | ICD-10-CM

## 2021-08-16 DIAGNOSIS — M62838 Other muscle spasm: Secondary | ICD-10-CM

## 2021-08-16 DIAGNOSIS — M25651 Stiffness of right hip, not elsewhere classified: Secondary | ICD-10-CM

## 2021-08-16 NOTE — Therapy (Signed)
[place], [place], [address] Phone: [phone]   Fax:  [phone]  Physical Therapy Treatment  Patient Details  Name: Robin Christensen MRN: [id number] Date of Birth: [birth year] Referring Provider (PT): Norva Karvonen   Encounter Date: 08/16/2021    Past Medical History:  Diagnosis Date   A-fib [facility])    B12 deficiency    Back pain    Breast cancer ([place])    Cancer ([place])    Right Breast Cancer   Chronic headaches    Dysrhythmia    AF   Edema, lower extremity    Gallbladder disease    GERD (gastroesophageal reflux disease)    Hyperlipidemia    Hypertension    Hypothyroidism    Joint pain    Obesity    Pneumonia    PONV (postoperative nausea and vomiting)    Sleep apnea    cpcap   Vitamin deficiency     Past Surgical History:  Procedure Laterality Date   BREAST SURGERY     CARDIOVERSION N/A 06/26/2014   Procedure: CARDIOVERSION;  Surgeon: Laverda Page, MD;  Location: [place];  Service: Cardiovascular;  Laterality: N/A;   CATARACT EXTRACTION     CHOLECYSTECTOMY     DILATION AND CURETTAGE OF UTERUS     eyelid surgery     TOTAL KNEE ARTHROPLASTY Right 05/03/2016   Procedure: TOTAL KNEE ARTHROPLASTY;  Surgeon: Melrose Nakayama, MD;  Location: [place];  Service: Orthopedics;  Laterality: Right;   TOTAL KNEE ARTHROPLASTY Left 08/14/2018   Procedure: TOTAL KNEE ARTHROPLASTY;  Surgeon: Melrose Nakayama, MD;  Location: [place];  Service: Orthopedics;  Laterality: Left;    There were no vitals filed for this visit.   Subjective Assessment - 08/16/21 0849     Subjective Last session was good. Pt reports that at the end the manual therapy felt good. Pt reports the TENS unit did help. Pt states she felt pretty good through Friday evening and then after sleeping it zinged it back on.    Limitations Sitting;Walking;Standing    How long can you sit comfortably? Maybe 20-30 minutes     How long can you stand comfortably? Maybe 20-30 minutes    How long can you walk comfortably? 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 No impingement to explain leg symptoms.    Patient Stated Goals decrease pain    Currently in Pain? Yes                [facility] PT Assessment - 08/16/21 0001       Assessment   Medical Diagnosis R piriformis syndrome    Referring Provider (PT) Norva Karvonen    Prior Therapy Back and L knee                           [facility] Adult PT Treatment/Exercise - 08/16/21 0001       Lumbar Exercises: Stretches   Hip Flexor Stretch 2 reps;30 seconds    Hip Flexor Stretch Limitations supine    Piriformis Stretch Right;2 reps;30 seconds;Left    Figure 4 Stretch 2 reps;30 seconds;Seated    Figure 4 Stretch Limitations bilat      Lumbar Exercises: Aerobic   Nustep L5 x 6 min for warm up      Lumbar Exercises: Seated   Other Seated Lumbar Exercises Hip hinge x  3 (trial)      Lumbar Exercises: Sidelying   Clam Right;20 reps    Clam Limitations red tband      Lumbar Exercises: Prone   Straight Leg Raise 20 reps    Straight Leg Raises Limitations knee flexed      Electrical Stimulation   Electrical Stimulation Location Rt glute    Electrical Stimulation Action TENS    Electrical Stimulation Parameters to tolerance    Electrical Stimulation Goals Pain      Manual Therapy   Manual therapy comments Pin and stretch R QL    Soft tissue mobilization STM R upper glute and QL                     PT Education - 08/16/21 0933     Education Details Discussed body positioning for sleep and house work. Discussed hip hinge with sit to stand. Discussed TENs unit.    Person(s) Educated Patient    Methods Explanation;Demonstration;Tactile cues;Verbal cues;Handout    Comprehension Verbalized understanding;Returned demonstration;Verbal cues required;Tactile cues required;Need  further instruction              PT Short Term Goals - 08/09/21 0918       PT SHORT TERM GOAL #1   Title independent with initial HEP    Time 3    Period Weeks    Status New    Target Date 08/30/21      PT SHORT TERM GOAL #2   Title improve hip strength to at least 3+/5    Time 3    Period Weeks    Status New    Target Date 08/30/21      PT SHORT TERM GOAL #3   Title pt will be able to walk for at least 10 minutes for exercise/increased activity    Time 3    Period Weeks    Status New    Target Date 08/30/21               PT Long Term Goals - 08/09/21 0848       PT LONG TERM GOAL #1   Title independent with HEP for hip and core strengthening    Time 6    Period Weeks    Status New    Target Date 09/20/21      PT LONG TERM GOAL #2   Title FOTO score improved to 59    Baseline 47    Time 6    Period Weeks    Status New    Target Date 09/20/21      PT LONG TERM GOAL #3   Title Pt will report >50% decrease in pain levels    Baseline 8 to 9/10 at worst    Time 6    Period Weeks    Status New    Target Date 09/20/21      PT LONG TERM GOAL #4   Title Pt will be able to increase hip strength to at least 4-/5 for improved stability with standing and walking    Time 6    Period Weeks    Status New    Target Date 09/20/21      PT LONG TERM GOAL #5   Title Pt will report improved sleep by at least 50%    Time 6    Period Weeks    Status New    Target Date 09/20/21  Plan - 08/16/21 0929     Clinical Impression Statement Less tender in glutes and piriformis this session. Mostly tender along distal QL near sacrum - gentle manual to address this. Continued to work on piriformis stretching and hip strengthening. Provided education on improving body postures with house work and daily tasks as well as use of TENS unit. Discussed trialing sleeping positions that may decrease her morning stiffness.    Personal Factors and  Comorbidities Fitness;Age;Time since onset of injury/illness/exacerbation;Past/Current Experience    Examination-Activity Limitations Locomotion Level;Transfers;Bed Mobility;Stairs;Squat;Stand;Sit;Sleep    Examination-Participation Restrictions Cleaning;Community Activity;Driving;Laundry;Yard Work;Meal Prep;Shop    PT Treatment/Interventions ADLs/Self Care Home Management;Aquatic Therapy;Cryotherapy;Electrical Stimulation;Iontophoresis 4mg /ml Dexamethasone;Moist Heat;Traction;Gait training;Stair training;Functional mobility training;Therapeutic activities;Therapeutic exercise;Balance training;Neuromuscular re-education;Manual techniques;Patient/family education;Dry needling;Passive range of motion;Taping    PT Next Visit Plan progress strengthening. manual as tolerated. Work on hip hinge and sit to stand    PT Home Exercise Plan Access Code: [email or web address]    Consulted and Agree with Plan of Care Patient             Patient will benefit from skilled therapeutic intervention in order to improve the following deficits and impairments:  Abnormal gait, Decreased range of motion, Difficulty walking, Increased fascial restricitons, Increased muscle spasms, Decreased activity tolerance, Pain, Hypomobility, Decreased mobility, Decreased strength, Postural dysfunction  Visit Diagnosis: Muscle weakness (generalized)  Other muscle spasm  Chronic right-sided low back pain with right-sided sciatica  Pain in right hip  Stiffness of right hip, not elsewhere classified  Other abnormalities of gait and mobility     Problem List Patient Active Problem List   Diagnosis Date Noted   Other fatigue increased 06/01/2020   Atrial fibrillation ([place]) 04/23/2020   Vitamin D deficiency 04/08/2020   Hyperglycemia 03/12/2020   Essential hypertension 03/12/2020   Mixed hyperlipidemia 01/14/2020   Lower extremity edema 01/14/2020   Depression 01/14/2020   Class 1 obesity with serious comorbidity and body mass  index (BMI) of 34.0 to 34.9 in adult 01/14/2020   Primary osteoarthritis of left knee 08/14/2018   Primary localized osteoarthritis of right knee 05/03/2016   Primary osteoarthritis of right knee 05/03/2016   Abnormal auditory perception of both ears 12/01/2015    [facility] April Ma L Margaree Sandhu, PT, DPT 08/16/2021, 9:34 AM,  [facility] [place] [address] [place], [place], [address] Phone: [phone]   Fax:  [phone]  Name: Robin Christensen MRN: [id number] Date of Birth: 1950-12-08

## 2021-08-18 ENCOUNTER — Other Ambulatory Visit: Payer: Self-pay

## 2021-08-18 ENCOUNTER — Ambulatory Visit: Payer: Medicare HMO | Attending: Orthopedic Surgery | Admitting: Physical Therapy

## 2021-08-18 DIAGNOSIS — M25651 Stiffness of right hip, not elsewhere classified: Secondary | ICD-10-CM | POA: Insufficient documentation

## 2021-08-18 DIAGNOSIS — M25551 Pain in right hip: Secondary | ICD-10-CM | POA: Diagnosis not present

## 2021-08-18 DIAGNOSIS — M5441 Lumbago with sciatica, right side: Secondary | ICD-10-CM | POA: Diagnosis not present

## 2021-08-18 DIAGNOSIS — M62838 Other muscle spasm: Secondary | ICD-10-CM | POA: Insufficient documentation

## 2021-08-18 DIAGNOSIS — R2689 Other abnormalities of gait and mobility: Secondary | ICD-10-CM | POA: Insufficient documentation

## 2021-08-18 DIAGNOSIS — G8929 Other chronic pain: Secondary | ICD-10-CM | POA: Insufficient documentation

## 2021-08-18 DIAGNOSIS — M6281 Muscle weakness (generalized): Secondary | ICD-10-CM | POA: Diagnosis not present

## 2021-08-18 NOTE — Therapy (Signed)
[place], [place], [address] Phone: [phone]   Fax:  [phone]  Physical Therapy Treatment  Patient Details  Name: Robin Christensen MRN: [id number] Date of Birth: 12-Sep-1950 Referring Provider (PT): Norva Karvonen   Encounter Date: 08/18/2021   PT End of Session - 08/18/21 1010     Visit Number 4    Number of Visits 12    Date for PT Re-Evaluation 09/20/21    Authorization Type [employer]    Authorization - Visit Number 4    Progress Note Due on Visit 10    PT Start Time 0930    PT Stop Time 6301    PT Time Calculation (min) 45 min    Activity Tolerance Patient tolerated treatment well    Behavior During Therapy [facility] for tasks assessed/performed             Past Medical History:  Diagnosis Date   A-fib ([place])    B12 deficiency    Back pain    Breast cancer ([place])    Cancer ([place])    Right Breast Cancer   Chronic headaches    Dysrhythmia    AF   Edema, lower extremity    Gallbladder disease    GERD (gastroesophageal reflux disease)    Hyperlipidemia    Hypertension    Hypothyroidism    Joint pain    Obesity    Pneumonia    PONV (postoperative nausea and vomiting)    Sleep apnea    cpcap   Vitamin deficiency     Past Surgical History:  Procedure Laterality Date   BREAST SURGERY     CARDIOVERSION N/A 06/26/2014   Procedure: CARDIOVERSION;  Surgeon: Laverda Page, MD;  Location: [place];  Service: Cardiovascular;  Laterality: N/A;   CATARACT EXTRACTION     CHOLECYSTECTOMY     DILATION AND CURETTAGE OF UTERUS     eyelid surgery     TOTAL KNEE ARTHROPLASTY Right 05/03/2016   Procedure: TOTAL KNEE ARTHROPLASTY;  Surgeon: Melrose Nakayama, MD;  Location: [place];  Service: Orthopedics;  Laterality: Right;   TOTAL KNEE ARTHROPLASTY Left 08/14/2018   Procedure: TOTAL KNEE ARTHROPLASTY;  Surgeon: Melrose Nakayama, MD;  Location: [place];  Service: Orthopedics;   Laterality: Left;    There were no vitals filed for this visit.   Subjective Assessment - 08/18/21 0930     Subjective Pt states she has had a few "bad days" this week. She states she thinks sleeping makes her back worse but she did try to sleep on her side with a pillow between her knees    Patient Stated Goals decrease pain    Currently in Pain? Yes    Pain Score 7     Pain Location Buttocks    Pain Orientation Right    Pain Descriptors / Indicators Sore                [facility] PT Assessment - 08/18/21 0001       Assessment   Medical Diagnosis R piriformis syndrome    Referring Provider (PT) Herminio Commons Dumonski    Prior Therapy Back and L knee      Palpation   SI assessment  hypomobile CPAs and UPAs                           [facility] Adult PT Treatment/Exercise - 08/18/21 0001  Lumbar Exercises: Stretches   Hip Flexor Stretch Left;Right;2 reps;30 seconds    Hip Flexor Stretch Limitations standing    Figure 4 Stretch 2 reps;30 seconds;Seated    Figure 4 Stretch Limitations bilat      Lumbar Exercises: Aerobic   Nustep L5 x 5 min for warm up      Lumbar Exercises: Standing   Other Standing Lumbar Exercises standing hip abduction and hip extension both 2 x 10 bilat      Lumbar Exercises: Seated   Sit to Stand 5 reps    Sit to Stand Limitations with cues for hip hinge      Lumbar Exercises: Sidelying   Clam 20 reps;Right    Clam Limitations then reverse clam x 20      Electrical Stimulation   Electrical Stimulation Location Rt glute    Electrical Stimulation Action TENS    Electrical Stimulation Parameters to tolerance    Electrical Stimulation Goals Pain      Manual Therapy   Joint Mobilization SIJ grade 1-2 PAs and UPAs to improve mobility and decrease pain and mm guarding    Soft tissue mobilization STM Rt glute and piriformis                       PT Short Term Goals - 08/09/21 0918       PT SHORT TERM GOAL #1   Title  independent with initial HEP    Time 3    Period Weeks    Status New    Target Date 08/30/21      PT SHORT TERM GOAL #2   Title improve hip strength to at least 3+/5    Time 3    Period Weeks    Status New    Target Date 08/30/21      PT SHORT TERM GOAL #3   Title pt will be able to walk for at least 10 minutes for exercise/increased activity    Time 3    Period Weeks    Status New    Target Date 08/30/21               PT Long Term Goals - 08/09/21 0848       PT LONG TERM GOAL #1   Title independent with HEP for hip and core strengthening    Time 6    Period Weeks    Status New    Target Date 09/20/21      PT LONG TERM GOAL #2   Title FOTO score improved to 59    Baseline 47    Time 6    Period Weeks    Status New    Target Date 09/20/21      PT LONG TERM GOAL #3   Title Pt will report >50% decrease in pain levels    Baseline 8 to 9/10 at worst    Time 6    Period Weeks    Status New    Target Date 09/20/21      PT LONG TERM GOAL #4   Title Pt will be able to increase hip strength to at least 4-/5 for improved stability with standing and walking    Time 6    Period Weeks    Status New    Target Date 09/20/21      PT LONG TERM GOAL #5   Title Pt will report improved sleep by at least 50%    Time 6  Period Weeks    Status New    Target Date 09/20/21                   Plan - 08/18/21 1011     Clinical Impression Statement Pt states she has been more sore/painful since performing prone exercises. Exercises modified to standing position with decreased pain. Pt initially very tender with manual work, improved throughout session    PT Next Visit Plan progress core strength, manual as tolerated    PT Home Exercise Plan Access Code: [email or web address]    Consulted and Agree with Plan of Care Patient             Patient will benefit from skilled therapeutic intervention in order to improve the following deficits and impairments:     Visit  Diagnosis: Muscle weakness (generalized)  Other muscle spasm  Chronic right-sided low back pain with right-sided sciatica     Problem List Patient Active Problem List   Diagnosis Date Noted   Other fatigue increased 06/01/2020   Atrial fibrillation ([place]) 04/23/2020   Vitamin D deficiency 04/08/2020   Hyperglycemia 03/12/2020   Essential hypertension 03/12/2020   Mixed hyperlipidemia 01/14/2020   Lower extremity edema 01/14/2020   Depression 01/14/2020   Class 1 obesity with serious comorbidity and body mass index (BMI) of 34.0 to 34.9 in adult 01/14/2020   Primary osteoarthritis of left knee 08/14/2018   Primary localized osteoarthritis of right knee 05/03/2016   Primary osteoarthritis of right knee 05/03/2016   Abnormal auditory perception of both ears 12/01/2015    Rajeev Escue, PT 08/18/2021, 10:13 AM  [facility] [place] [address] [place], [place], [address] Phone: [phone]   Fax:  [phone]  Name: CYSTAL SHANNAHAN MRN: [id number] Date of Birth: 1950/12/30

## 2021-08-23 ENCOUNTER — Ambulatory Visit: Payer: Medicare HMO | Admitting: Physical Therapy

## 2021-08-23 ENCOUNTER — Other Ambulatory Visit: Payer: Self-pay

## 2021-08-23 ENCOUNTER — Encounter: Payer: Self-pay | Admitting: Physical Therapy

## 2021-08-23 DIAGNOSIS — G8929 Other chronic pain: Secondary | ICD-10-CM

## 2021-08-23 DIAGNOSIS — R2689 Other abnormalities of gait and mobility: Secondary | ICD-10-CM | POA: Diagnosis not present

## 2021-08-23 DIAGNOSIS — M6281 Muscle weakness (generalized): Secondary | ICD-10-CM | POA: Diagnosis not present

## 2021-08-23 DIAGNOSIS — M5441 Lumbago with sciatica, right side: Secondary | ICD-10-CM | POA: Diagnosis not present

## 2021-08-23 DIAGNOSIS — M25651 Stiffness of right hip, not elsewhere classified: Secondary | ICD-10-CM

## 2021-08-23 DIAGNOSIS — M25551 Pain in right hip: Secondary | ICD-10-CM

## 2021-08-23 DIAGNOSIS — M62838 Other muscle spasm: Secondary | ICD-10-CM | POA: Diagnosis not present

## 2021-08-23 NOTE — Therapy (Signed)
[place], [place], [address] Phone: [phone]   Fax:  [phone]  Physical Therapy Treatment  Patient Details  Name: Robin Christensen MRN: [id number] Date of Birth: [birth year] Referring Provider (PT): Norva Karvonen   Encounter Date: 08/23/2021   PT End of Session - 08/23/21 0904     Visit Number 5    Number of Visits 12    Date for PT Re-Evaluation 09/20/21    Authorization Type [employer]    Authorization - Visit Number 5    Progress Note Due on Visit 10    PT Start Time [phone]    PT Stop Time 0930    PT Time Calculation (min) 44 min    Activity Tolerance Patient tolerated treatment well    Behavior During Therapy [facility] for tasks assessed/performed             Past Medical History:  Diagnosis Date   A-fib ([place])    B12 deficiency    Back pain    Breast cancer ([place])    Cancer ([place])    Right Breast Cancer   Chronic headaches    Dysrhythmia    AF   Edema, lower extremity    Gallbladder disease    GERD (gastroesophageal reflux disease)    Hyperlipidemia    Hypertension    Hypothyroidism    Joint pain    Obesity    Pneumonia    PONV (postoperative nausea and vomiting)    Sleep apnea    cpcap   Vitamin deficiency     Past Surgical History:  Procedure Laterality Date   BREAST SURGERY     CARDIOVERSION N/A 06/26/2014   Procedure: CARDIOVERSION;  Surgeon: Laverda Page, MD;  Location: [place];  Service: Cardiovascular;  Laterality: N/A;   CATARACT EXTRACTION     CHOLECYSTECTOMY     DILATION AND CURETTAGE OF UTERUS     eyelid surgery     TOTAL KNEE ARTHROPLASTY Right 05/03/2016   Procedure: TOTAL KNEE ARTHROPLASTY;  Surgeon: Melrose Nakayama, MD;  Location: [name];  Service: Orthopedics;  Laterality: Right;   TOTAL KNEE ARTHROPLASTY Left 08/14/2018   Procedure: TOTAL KNEE ARTHROPLASTY;  Surgeon: Melrose Nakayama, MD;  Location: [place];  Service: Orthopedics;   Laterality: Left;    There were no vitals filed for this visit.   Subjective Assessment - 08/23/21 0850     Subjective Pt reports she feels, "a tad bit better".  She slept better last night.  She is doing exercises 1x/day.    Currently in Pain? Yes    Pain Score 4     Pain Location Hip    Pain Orientation Right    Pain Descriptors / Indicators Sore    Aggravating Factors  sitting, sleeping    Pain Relieving Factors exercises                [facility] PT Assessment - 08/23/21 0001       Assessment   Medical Diagnosis R piriformis syndrome    Referring Provider (PT) Norva Karvonen    Next MD Visit PRN    Prior Therapy Back and L knee                [facility] Adult PT Treatment/Exercise - 08/23/21 0001       Self-Care   Self-Care Other Self-Care Comments    Other Self-Care Comments  pt instructed in operation of TENS unit.  Pt returned  demo with cues.  Reviewed self MFR with ball to Rt ant hip; pt returned demo x 3 min      Lumbar Exercises: Stretches   Hip Flexor Stretch Right;2 reps;30 seconds   thomas position   Hip Flexor Stretch Limitations in standing, pt only felt pinch in buttocks and no ant stretch - switched to supine    Piriformis Stretch Right;Left;1 rep;30 seconds   seated   Figure 4 Stretch 1 rep;20 seconds   seated     Lumbar Exercises: Aerobic   Tread Mill 1.0-1.5 mph x 4 min for warm up    Other Aerobic Exercise single laps around gym in between exercises to assess response to exercise and decrease overall stiffness.      Lumbar Exercises: Standing   Other Standing Lumbar Exercises standing hip abduction and hip extension both 2 x 10 bilat      Modalities   Modalities Moist Heat;Electrical Stimulation      Moist Heat Therapy   Number Minutes Moist Heat 10 Minutes    Moist Heat Location Hip      Electrical Stimulation   Electrical Stimulation Location Rt glute / piriformis    Electrical Stimulation Action TENS    Electrical Stimulation  Parameters 10 min, intensity to tolerance    Electrical Stimulation Goals Pain                       PT Short Term Goals - 08/09/21 0918       PT SHORT TERM GOAL #1   Title independent with initial HEP    Time 3    Period Weeks    Status New    Target Date 08/30/21      PT SHORT TERM GOAL #2   Title improve hip strength to at least 3+/5    Time 3    Period Weeks    Status New    Target Date 08/30/21      PT SHORT TERM GOAL #3   Title pt will be able to walk for at least 10 minutes for exercise/increased activity    Time 3    Period Weeks    Status New    Target Date 08/30/21               PT Long Term Goals - 08/09/21 0848       PT LONG TERM GOAL #1   Title independent with HEP for hip and core strengthening    Time 6    Period Weeks    Status New    Target Date 09/20/21      PT LONG TERM GOAL #2   Title FOTO score improved to 59    Baseline 47    Time 6    Period Weeks    Status New    Target Date 09/20/21      PT LONG TERM GOAL #3   Title Pt will report >50% decrease in pain levels    Baseline 8 to 9/10 at worst    Time 6    Period Weeks    Status New    Target Date 09/20/21      PT LONG TERM GOAL #4   Title Pt will be able to increase hip strength to at least 4-/5 for improved stability with standing and walking    Time 6    Period Weeks    Status New    Target Date 09/20/21  PT LONG TERM GOAL #5   Title Pt will report improved sleep by at least 50%    Time 6    Period Weeks    Status New    Target Date 09/20/21                   Plan - 08/23/21 0913     Clinical Impression Statement Positive response to exercises thus far; pt reporting relief with completion of HEP.  Bilat hamstrings remain tight.  Rt ant hip tight, but responded well to MFR with ball in prone.  Instructed pt in operation of TENS so that she can begin using her TENS at home again for pain management. Pt making gradual gains towards LTGs.     PT Next Visit Plan progress core strength, manual as tolerated    PT Home Exercise Plan Access Code: [email or web address]    [email or web address] and Agree with Plan of Care Patient             Patient will benefit from skilled therapeutic intervention in order to improve the following deficits and impairments:     Visit Diagnosis: Muscle weakness (generalized)  Chronic right-sided low back pain with right-sided sciatica  Pain in right hip  Stiffness of right hip, not elsewhere classified     Problem List Patient Active Problem List   Diagnosis Date Noted   Other fatigue increased 06/01/2020   Atrial fibrillation ([place]) 04/23/2020   Vitamin D deficiency 04/08/2020   Hyperglycemia 03/12/2020   Essential hypertension 03/12/2020   Mixed hyperlipidemia 01/14/2020   Lower extremity edema 01/14/2020   Depression 01/14/2020   Class 1 obesity with serious comorbidity and body mass index (BMI) of 34.0 to 34.9 in adult 01/14/2020   Primary osteoarthritis of left knee 08/14/2018   Primary localized osteoarthritis of right knee 05/03/2016   Primary osteoarthritis of right knee 05/03/2016   Abnormal auditory perception of both ears 12/01/2015  Kerin Perna, PTA 08/23/21 9:36 AM  [place], [place], [address] Phone: [phone]   Fax:  [phone]  Name: RINDY KOLLMAN MRN: [id number] Date of Birth: 05/01/1951

## 2021-08-26 ENCOUNTER — Ambulatory Visit: Payer: Medicare HMO | Admitting: Physical Therapy

## 2021-08-26 ENCOUNTER — Other Ambulatory Visit: Payer: Self-pay

## 2021-08-26 DIAGNOSIS — M6281 Muscle weakness (generalized): Secondary | ICD-10-CM

## 2021-08-26 DIAGNOSIS — G8929 Other chronic pain: Secondary | ICD-10-CM | POA: Diagnosis not present

## 2021-08-26 DIAGNOSIS — M25551 Pain in right hip: Secondary | ICD-10-CM | POA: Diagnosis not present

## 2021-08-26 DIAGNOSIS — R2689 Other abnormalities of gait and mobility: Secondary | ICD-10-CM | POA: Diagnosis not present

## 2021-08-26 DIAGNOSIS — M25651 Stiffness of right hip, not elsewhere classified: Secondary | ICD-10-CM

## 2021-08-26 DIAGNOSIS — M62838 Other muscle spasm: Secondary | ICD-10-CM | POA: Diagnosis not present

## 2021-08-26 DIAGNOSIS — M5441 Lumbago with sciatica, right side: Secondary | ICD-10-CM | POA: Diagnosis not present

## 2021-08-26 NOTE — Therapy (Signed)
[place], [place], [address] Phone: [phone]   Fax:  [phone]  Physical Therapy Treatment  Patient Details  Name: GINNA SCHUUR MRN: [id number] Date of Birth: Dec 03, 1950 Referring Provider (PT): Norva Karvonen   Encounter Date: 08/26/2021   PT End of Session - 08/26/21 0850     Visit Number 6    Number of Visits 12    Date for PT Re-Evaluation 09/20/21    Authorization Type [employer]    Authorization - Visit Number 6    Progress Note Due on Visit 10    PT Start Time [phone]    PT Stop Time 0930    PT Time Calculation (min) 43 min    Activity Tolerance Patient tolerated treatment well    Behavior During Therapy [facility] for tasks assessed/performed             Past Medical History:  Diagnosis Date   A-fib ([place])    B12 deficiency    Back pain    Breast cancer ([place])    Cancer ([place])    Right Breast Cancer   Chronic headaches    Dysrhythmia    AF   Edema, lower extremity    Gallbladder disease    GERD (gastroesophageal reflux disease)    Hyperlipidemia    Hypertension    Hypothyroidism    Joint pain    Obesity    Pneumonia    PONV (postoperative nausea and vomiting)    Sleep apnea    cpcap   Vitamin deficiency     Past Surgical History:  Procedure Laterality Date   BREAST SURGERY     CARDIOVERSION N/A 06/26/2014   Procedure: CARDIOVERSION;  Surgeon: Laverda Page, MD;  Location: [place];  Service: Cardiovascular;  Laterality: N/A;   CATARACT EXTRACTION     CHOLECYSTECTOMY     DILATION AND CURETTAGE OF UTERUS     eyelid surgery     TOTAL KNEE ARTHROPLASTY Right 05/03/2016   Procedure: TOTAL KNEE ARTHROPLASTY;  Surgeon: Melrose Nakayama, MD;  Location: [place];  Service: Orthopedics;  Laterality: Right;   TOTAL KNEE ARTHROPLASTY Left 08/14/2018   Procedure: TOTAL KNEE ARTHROPLASTY;  Surgeon: Melrose Nakayama, MD;  Location: [place];  Service: Orthopedics;   Laterality: Left;    There were no vitals filed for this visit.   Subjective Assessment - 08/26/21 0851     Subjective Pt reports she was not able to do the exercises yesterday; however, was able to do them earlier this week. Pt reports plans to try and walk later today    Limitations Sitting;Walking;Standing    How long can you sit comfortably? Maybe 20-30 minutes    How long can you stand comfortably? Maybe 20-30 minutes    Diagnostic tests MRI 2020: Disc degeneration at L2-3 to L4-5. No impingement to explain leg symptoms.    Patient Stated Goals decrease pain    Currently in Pain? Yes    Pain Score 4                 [facility] PT Assessment - 08/26/21 0001       Assessment   Medical Diagnosis R piriformis syndrome    Referring Provider (PT) Norva Karvonen    Prior Therapy Back and L knee                           [facility] Adult PT  Treatment/Exercise - 08/26/21 0001       Lumbar Exercises: Stretches   Passive Hamstring Stretch 2 reps;30 seconds    Hip Flexor Stretch 30 seconds    Piriformis Stretch Right;Left;30 seconds;2 reps    Piriformis Stretch Limitations seated    Figure 4 Stretch --      Lumbar Exercises: Aerobic   Tread Mill 1.8 mph x 5 min for warm up      Lumbar Exercises: Standing   Other Standing Lumbar Exercises standing hip abduction and hip extension both 2 x 10 bilat   red tband     Lumbar Exercises: Seated   Other Seated Lumbar Exercises pball press down 3x10 sec      Moist Heat Therapy   Number Minutes Moist Heat 5 Minutes    Moist Heat Location Hip      Manual Therapy   Manual therapy comments Pin and stretch R piriformis and glutes    Joint Mobilization SIJ grade 1-2 PAs and UPAs to improve mobility and decrease pain and mm guarding    Soft tissue mobilization STM Rt glute and piriformis                     PT Education - 08/26/21 0927     Education Details Discussed walking for 5-10 minutes in addition to her  exercises. Discussed giving herself a rest day and just stretching if she's too sore with the added tband.    Person(s) Educated Patient    Methods Explanation;Demonstration;Tactile cues;Verbal cues    Comprehension Verbalized understanding;Returned demonstration;Verbal cues required              PT Short Term Goals - 08/26/21 0904       PT SHORT TERM GOAL #1   Title independent with initial HEP    Time 3    Period Weeks    Status Achieved    Target Date 08/30/21      PT SHORT TERM GOAL #2   Title improve hip strength to at least 3+/5    Time 3    Period Weeks    Status Achieved    Target Date 08/30/21      PT SHORT TERM GOAL #3   Title pt will be able to walk for at least 10 minutes for exercise/increased activity    Time 3    Period Weeks    Status On-going    Target Date 08/30/21               PT Long Term Goals - 08/09/21 0848       PT LONG TERM GOAL #1   Title independent with HEP for hip and core strengthening    Time 6    Period Weeks    Status New    Target Date 09/20/21      PT LONG TERM GOAL #2   Title FOTO score improved to 59    Baseline 47    Time 6    Period Weeks    Status New    Target Date 09/20/21      PT LONG TERM GOAL #3   Title Pt will report >50% decrease in pain levels    Baseline 8 to 9/10 at worst    Time 6    Period Weeks    Status New    Target Date 09/20/21      PT LONG TERM GOAL #4   Title Pt will be able to increase hip strength  to at least 4-/5 for improved stability with standing and walking    Time 6    Period Weeks    Status New    Target Date 09/20/21      PT LONG TERM GOAL #5   Title Pt will report improved sleep by at least 50%    Time 6    Period Weeks    Status New    Target Date 09/20/21                   Plan - 08/26/21 0908     Clinical Impression Statement Pt with continued improvements in strength and hip mobility. Able to demo 3+/5 hip abduction today. Progressed her to red  tband with good pt tolerance. Pt reports less "burning" sensation in the last week.    Personal Factors and Comorbidities Fitness;Age;Time since onset of injury/illness/exacerbation;Past/Current Experience    Examination-Activity Limitations Locomotion Level;Transfers;Bed Mobility;Stairs;Squat;Stand;Sit;Sleep    Examination-Participation Restrictions Cleaning;Community Activity;Driving;Laundry;Yard Work;Meal Prep;Shop    Rehab Potential Good    PT Frequency 2x / week    PT Duration 6 weeks    PT Treatment/Interventions ADLs/Self Care Home Management;Aquatic Therapy;Cryotherapy;Electrical Stimulation;Iontophoresis 4mg /ml Dexamethasone;Moist Heat;Traction;Gait training;Stair training;Functional mobility training;Therapeutic activities;Therapeutic exercise;Balance training;Neuromuscular re-education;Manual techniques;Patient/family education;Dry needling;Passive range of motion;Taping    PT Next Visit Plan progress core/hip strength, manual as tolerated    PT Home Exercise Plan Access Code: [email or web address]    [email or web address] and Agree with Plan of Care Patient             Patient will benefit from skilled therapeutic intervention in order to improve the following deficits and impairments:  Abnormal gait, Decreased range of motion, Difficulty walking, Increased fascial restricitons, Increased muscle spasms, Decreased activity tolerance, Pain, Hypomobility, Decreased mobility, Decreased strength, Postural dysfunction  Visit Diagnosis: Muscle weakness (generalized)  Chronic right-sided low back pain with right-sided sciatica  Pain in right hip  Stiffness of right hip, not elsewhere classified  Other muscle spasm  Other abnormalities of gait and mobility     Problem List Patient Active Problem List   Diagnosis Date Noted   Other fatigue increased 06/01/2020   Atrial fibrillation ([place]) 04/23/2020   Vitamin D deficiency 04/08/2020   Hyperglycemia 03/12/2020   Essential hypertension 03/12/2020    Mixed hyperlipidemia 01/14/2020   Lower extremity edema 01/14/2020   Depression 01/14/2020   Class 1 obesity with serious comorbidity and body mass index (BMI) of 34.0 to 34.9 in adult 01/14/2020   Primary osteoarthritis of left knee 08/14/2018   Primary localized osteoarthritis of right knee 05/03/2016   Primary osteoarthritis of right knee 05/03/2016   Abnormal auditory perception of both ears 12/01/2015    [facility] April Ma L Moss Berry, PT, DPT 08/26/2021, 9:28 AM  [facility] [place], [place], [address] Phone: [phone]   Fax:  [phone]  Name: GREENLEIGH KAUTH MRN: [id number] Date of Birth: May 15, 1951

## 2021-08-30 ENCOUNTER — Encounter: Payer: Self-pay | Admitting: Physical Therapy

## 2021-08-30 ENCOUNTER — Ambulatory Visit: Payer: Medicare HMO | Admitting: Physical Therapy

## 2021-08-30 ENCOUNTER — Other Ambulatory Visit: Payer: Self-pay

## 2021-08-30 DIAGNOSIS — M6281 Muscle weakness (generalized): Secondary | ICD-10-CM | POA: Diagnosis not present

## 2021-08-30 DIAGNOSIS — M25551 Pain in right hip: Secondary | ICD-10-CM | POA: Diagnosis not present

## 2021-08-30 DIAGNOSIS — M62838 Other muscle spasm: Secondary | ICD-10-CM | POA: Diagnosis not present

## 2021-08-30 DIAGNOSIS — G8929 Other chronic pain: Secondary | ICD-10-CM | POA: Diagnosis not present

## 2021-08-30 DIAGNOSIS — M25651 Stiffness of right hip, not elsewhere classified: Secondary | ICD-10-CM

## 2021-08-30 DIAGNOSIS — M5441 Lumbago with sciatica, right side: Secondary | ICD-10-CM | POA: Diagnosis not present

## 2021-08-30 DIAGNOSIS — R2689 Other abnormalities of gait and mobility: Secondary | ICD-10-CM | POA: Diagnosis not present

## 2021-08-30 NOTE — Therapy (Signed)
[place], [place], [address] Phone: [phone]   Fax:  [phone]  Physical Therapy Treatment  Patient Details  Name: Robin Christensen MRN: [id number] Date of Birth: 03/16/1951 Referring Provider (PT): Norva Karvonen   Encounter Date: 08/30/2021   PT End of Session - 08/30/21 0800     Visit Number 7    Number of Visits 12    Date for PT Re-Evaluation 09/20/21    Authorization Type [employer]    Authorization - Visit Number 7    Progress Note Due on Visit 10    PT Start Time 0800    PT Stop Time 8185    PT Time Calculation (min) 44 min    Behavior During Therapy [facility] for tasks assessed/performed             Past Medical History:  Diagnosis Date   A-fib ([place])    B12 deficiency    Back pain    Breast cancer ([place])    Cancer ([place])    Right Breast Cancer   Chronic headaches    Dysrhythmia    AF   Edema, lower extremity    Gallbladder disease    GERD (gastroesophageal reflux disease)    Hyperlipidemia    Hypertension    Hypothyroidism    Joint pain    Obesity    Pneumonia    PONV (postoperative nausea and vomiting)    Sleep apnea    cpcap   Vitamin deficiency     Past Surgical History:  Procedure Laterality Date   BREAST SURGERY     CARDIOVERSION N/A 06/26/2014   Procedure: CARDIOVERSION;  Surgeon: Laverda Page, MD;  Location: [place];  Service: Cardiovascular;  Laterality: N/A;   CATARACT EXTRACTION     CHOLECYSTECTOMY     DILATION AND CURETTAGE OF UTERUS     eyelid surgery     TOTAL KNEE ARTHROPLASTY Right 05/03/2016   Procedure: TOTAL KNEE ARTHROPLASTY;  Surgeon: Melrose Nakayama, MD;  Location: [place];  Service: Orthopedics;  Laterality: Right;   TOTAL KNEE ARTHROPLASTY Left 08/14/2018   Procedure: TOTAL KNEE ARTHROPLASTY;  Surgeon: Melrose Nakayama, MD;  Location: [place];  Service: Orthopedics;  Laterality: Left;    There were no vitals filed for this  visit.   Subjective Assessment - 08/30/21 0802     Subjective "I had a rough night last night".   She said she got up from bed and took 2 gabapentin and fell asleep in the recliner.  She attributes increased pain from sitting and watching SuperBowl last night. She plans to try sleeping in one of her guest beds tonight.    Limitations Sitting;Walking;Standing    How long can you sit comfortably? Maybe 20-30 minutes    How long can you stand comfortably? Maybe 20-30 minutes    Diagnostic tests MRI 2020: Disc degeneration at L2-3 to L4-5. No impingement to explain leg symptoms.    Patient Stated Goals decrease pain    Currently in Pain? Yes    Pain Score 5     Pain Location Hip    Pain Orientation Right    Pain Descriptors / Indicators Aching;Sharp    Aggravating Factors  sitting, sleeping    Pain Relieving Factors exercises, meds                [facility] PT Assessment - 08/30/21 0001       Assessment   Medical Diagnosis R piriformis  syndrome    Referring Provider (PT) Herminio Commons Dumonski    Hand Dominance Right    Next MD Visit PRN    Prior Therapy Back and L knee               [facility] Adult PT Treatment/Exercise - 08/30/21 0001       Lumbar Exercises: Stretches   Passive Hamstring Stretch 30 seconds;3 reps;Right;Left   hooklying with strap.  pillow under sacrum for comfort   Quad Stretch Right;Left;2 reps;20 seconds   prone with strap   Piriformis Stretch Right;Left;30 seconds;2 reps    Piriformis Stretch Limitations trial of modified pigeon pose      Lumbar Exercises: Aerobic   Tread Mill 1.8 mph x 5 min for warm up    Other Aerobic Exercise single laps around gym in between exercises to assess response to exercise and decrease overall stiffness.      Lumbar Exercises: Standing   Other Standing Lumbar Exercises resisted side stepping with red band x 10 ft R/L, 2 sets;  backwards walking with red band at ankles x 10 ft x 4      Lumbar Exercises: Supine   Bridge 5 reps    cues to engage core, squeeze glutes   Bridge Limitations limited tolerance      Lumbar Exercises: Prone   Other Prone Lumbar Exercises MFR on ball to Rt ant hip x 3 min      Moist Heat Therapy   Number Minutes Moist Heat 10 Minutes    Moist Heat Location Hip      Electrical Stimulation   Electrical Stimulation Location Rt glute/ piriformis    Electrical Stimulation Action IFC    Electrical Stimulation Parameters 10 min, intensity to tolerance    Electrical Stimulation Goals Pain                       PT Short Term Goals - 08/26/21 0904       PT SHORT TERM GOAL #1   Title independent with initial HEP    Time 3    Period Weeks    Status Achieved    Target Date 08/30/21      PT SHORT TERM GOAL #2   Title improve hip strength to at least 3+/5    Time 3    Period Weeks    Status Achieved    Target Date 08/30/21      PT SHORT TERM GOAL #3   Title pt will be able to walk for at least 10 minutes for exercise/increased activity    Time 3    Period Weeks    Status On-going    Target Date 08/30/21               PT Long Term Goals - 08/09/21 0848       PT LONG TERM GOAL #1   Title independent with HEP for hip and core strengthening    Time 6    Period Weeks    Status New    Target Date 09/20/21      PT LONG TERM GOAL #2   Title FOTO score improved to 59    Baseline 47    Time 6    Period Weeks    Status New    Target Date 09/20/21      PT LONG TERM GOAL #3   Title Pt will report >50% decrease in pain levels    Baseline 8 to 9/10  at worst    Time 6    Period Weeks    Status New    Target Date 09/20/21      PT LONG TERM GOAL #4   Title Pt will be able to increase hip strength to at least 4-/5 for improved stability with standing and walking    Time 6    Period Weeks    Status New    Target Date 09/20/21      PT LONG TERM GOAL #5   Title Pt will report improved sleep by at least 50%    Time 6    Period Weeks    Status New    Target  Date 09/20/21                   Plan - 08/30/21 0829     Clinical Impression Statement Pt fatigues quickly with resisted Rt side stepping. Overall, her strength is gradually improving.   She reported slight reduction of pain in SI with exercises and further reduction with heat and estim at end of session. Encouraged pt to avoid prolonged sitting (with back in flexed position) as this increases her pain.  Progressing gradully towards LTGs.    Personal Factors and Comorbidities Fitness;Age;Time since onset of injury/illness/exacerbation;Past/Current Experience    Examination-Activity Limitations Locomotion Level;Transfers;Bed Mobility;Stairs;Squat;Stand;Sit;Sleep    Examination-Participation Restrictions Cleaning;Community Activity;Driving;Laundry;Yard Work;Meal Prep;Shop    Rehab Potential Good    PT Frequency 2x / week    PT Duration 6 weeks    PT Treatment/Interventions ADLs/Self Care Home Management;Aquatic Therapy;Cryotherapy;Electrical Stimulation;Iontophoresis 4mg /ml Dexamethasone;Moist Heat;Traction;Gait training;Stair training;Functional mobility training;Therapeutic activities;Therapeutic exercise;Balance training;Neuromuscular re-education;Manual techniques;Patient/family education;Dry needling;Passive range of motion;Taping    PT Next Visit Plan progress core/hip strength, manual as tolerated    PT Home Exercise Plan Access Code: [email or web address]    [email or web address] and Agree with Plan of Care Patient             Patient will benefit from skilled therapeutic intervention in order to improve the following deficits and impairments:  Abnormal gait, Decreased range of motion, Difficulty walking, Increased fascial restricitons, Increased muscle spasms, Decreased activity tolerance, Pain, Hypomobility, Decreased mobility, Decreased strength, Postural dysfunction  Visit Diagnosis: Muscle weakness (generalized)  Chronic right-sided low back pain with right-sided sciatica  Pain in right  hip  Stiffness of right hip, not elsewhere classified     Problem List Patient Active Problem List   Diagnosis Date Noted   Other fatigue increased 06/01/2020   Atrial fibrillation ([place]) 04/23/2020   Vitamin D deficiency 04/08/2020   Hyperglycemia 03/12/2020   Essential hypertension 03/12/2020   Mixed hyperlipidemia 01/14/2020   Lower extremity edema 01/14/2020   Depression 01/14/2020   Class 1 obesity with serious comorbidity and body mass index (BMI) of 34.0 to 34.9 in adult 01/14/2020   Primary osteoarthritis of left knee 08/14/2018   Primary localized osteoarthritis of right knee 05/03/2016   Primary osteoarthritis of right knee 05/03/2016   Abnormal auditory perception of both ears 12/01/2015   Kerin Perna, PTA 08/30/21 8:41 AM   [place], [place], [address] Phone: [phone]   Fax:  [phone]  Name: Robin Christensen MRN: [id number] Date of Birth: Feb 22, 1951

## 2021-09-02 ENCOUNTER — Ambulatory Visit: Payer: Medicare HMO | Admitting: Physical Therapy

## 2021-09-02 ENCOUNTER — Other Ambulatory Visit: Payer: Self-pay

## 2021-09-02 DIAGNOSIS — M6281 Muscle weakness (generalized): Secondary | ICD-10-CM | POA: Diagnosis not present

## 2021-09-02 DIAGNOSIS — R2689 Other abnormalities of gait and mobility: Secondary | ICD-10-CM | POA: Diagnosis not present

## 2021-09-02 DIAGNOSIS — M62838 Other muscle spasm: Secondary | ICD-10-CM | POA: Diagnosis not present

## 2021-09-02 DIAGNOSIS — M25551 Pain in right hip: Secondary | ICD-10-CM | POA: Diagnosis not present

## 2021-09-02 DIAGNOSIS — M5441 Lumbago with sciatica, right side: Secondary | ICD-10-CM | POA: Diagnosis not present

## 2021-09-02 DIAGNOSIS — M25651 Stiffness of right hip, not elsewhere classified: Secondary | ICD-10-CM | POA: Diagnosis not present

## 2021-09-02 DIAGNOSIS — G8929 Other chronic pain: Secondary | ICD-10-CM

## 2021-09-02 NOTE — Therapy (Signed)
[place], [place], [address] Phone: [phone]   Fax:  [phone]  Physical Therapy Treatment  Patient Details  Name: Robin Christensen MRN: [id number] Date of Birth: October 25, 1950 Referring Provider (PT): Norva Karvonen   Encounter Date: 09/02/2021   PT End of Session - 09/02/21 0855     Visit Number 8    Number of Visits 12    Date for PT Re-Evaluation 09/20/21    Authorization Type [employer]    Authorization - Visit Number 8    Progress Note Due on Visit 10    PT Start Time 0850    PT Stop Time 0930    PT Time Calculation (min) 40 min    Activity Tolerance Patient tolerated treatment well    Behavior During Therapy [facility] for tasks assessed/performed             Past Medical History:  Diagnosis Date   A-fib ([place])    B12 deficiency    Back pain    Breast cancer ([place])    Cancer ([place])    Right Breast Cancer   Chronic headaches    Dysrhythmia    AF   Edema, lower extremity    Gallbladder disease    GERD (gastroesophageal reflux disease)    Hyperlipidemia    Hypertension    Hypothyroidism    Joint pain    Obesity    Pneumonia    PONV (postoperative nausea and vomiting)    Sleep apnea    cpcap   Vitamin deficiency     Past Surgical History:  Procedure Laterality Date   BREAST SURGERY     CARDIOVERSION N/A 06/26/2014   Procedure: CARDIOVERSION;  Surgeon: Laverda Page, MD;  Location: [place];  Service: Cardiovascular;  Laterality: N/A;   CATARACT EXTRACTION     CHOLECYSTECTOMY     DILATION AND CURETTAGE OF UTERUS     eyelid surgery     TOTAL KNEE ARTHROPLASTY Right 05/03/2016   Procedure: TOTAL KNEE ARTHROPLASTY;  Surgeon: Melrose Nakayama, MD;  Location: [place];  Service: Orthopedics;  Laterality: Right;   TOTAL KNEE ARTHROPLASTY Left 08/14/2018   Procedure: TOTAL KNEE ARTHROPLASTY;  Surgeon: Melrose Nakayama, MD;  Location: [place];  Service: Orthopedics;   Laterality: Left;    There were no vitals filed for this visit.   Subjective Assessment - 09/02/21 0855     Subjective Pt notes increased burning sensation in hip today. Pt states it woke her up in the middle of the night -- had to get out of bed and sleep in the recliner.    Limitations Sitting;Walking;Standing    How long can you sit comfortably? Maybe 20-30 minutes    How long can you stand comfortably? Maybe 20-30 minutes    Diagnostic tests MRI 2020: Disc degeneration at L2-3 to L4-5. No impingement to explain leg symptoms.    Patient Stated Goals decrease pain    Currently in Pain? Yes    Pain Score 6     Pain Location Hip    Pain Orientation Right                [facility] PT Assessment - 09/02/21 0001       Assessment   Medical Diagnosis R piriformis syndrome    Referring Provider (PT) Herminio Commons Dumonski    Hand Dominance Right    Next MD Visit PRN    Prior Therapy Back and L knee  [place] Adult PT Treatment/Exercise - 09/02/21 0001       Lumbar Exercises: Stretches   Passive Hamstring Stretch 30 seconds;Right;Left    Lower Trunk Rotation Limitations 10 reps each side    Piriformis Stretch Right;Left;30 seconds;2 reps    Other Lumbar Stretch Exercise butterfly stretch 2x30 sec      Lumbar Exercises: Sidelying   Clam 20 reps;Right      Lumbar Exercises: Prone   Other Prone Lumbar Exercises reverse clam shell 2x10 red tband    Other Prone Lumbar Exercises hamstring curl 2x10 red tband      Manual Therapy   Manual therapy comments Pin and stretch R piriformis and glutes    Joint Mobilization SIJ grade 1-2 PAs and UPAs; lateral rotation in sidelying lower lumbar and SIJ grade II    Soft tissue mobilization STM & TPR Rt glute and piriformis                       PT Short Term Goals - 08/26/21 0904       PT SHORT TERM GOAL #1   Title independent with initial HEP    Time 3    Period Weeks    Status  Achieved    Target Date 08/30/21      PT SHORT TERM GOAL #2   Title improve hip strength to at least 3+/5    Time 3    Period Weeks    Status Achieved    Target Date 08/30/21      PT SHORT TERM GOAL #3   Title pt will be able to walk for at least 10 minutes for exercise/increased activity    Time 3    Period Weeks    Status On-going    Target Date 08/30/21               PT Long Term Goals - 08/09/21 0848       PT LONG TERM GOAL #1   Title independent with HEP for hip and core strengthening    Time 6    Period Weeks    Status New    Target Date 09/20/21      PT LONG TERM GOAL #2   Title FOTO score improved to 59    Baseline 47    Time 6    Period Weeks    Status New    Target Date 09/20/21      PT LONG TERM GOAL #3   Title Pt will report >50% decrease in pain levels    Baseline 8 to 9/10 at worst    Time 6    Period Weeks    Status New    Target Date 09/20/21      PT LONG TERM GOAL #4   Title Pt will be able to increase hip strength to at least 4-/5 for improved stability with standing and walking    Time 6    Period Weeks    Status New    Target Date 09/20/21      PT LONG TERM GOAL #5   Title Pt will report improved sleep by at least 50%    Time 6    Period Weeks    Status New    Target Date 09/20/21                   Plan - 09/02/21 0926     Clinical Impression Statement Pt with increased exacerbation of hip pain  today. Worked primarily on stretching and gentle strengthening, manual therapy and AROM. Provided butterfly stretch and pt reports easing of symptoms. Discussed trialing this as a sleeping position if she finds it comfortable. Reports no burning by end of session.    Personal Factors and Comorbidities Fitness;Age;Time since onset of injury/illness/exacerbation;Past/Current Experience    Examination-Activity Limitations Locomotion Level;Transfers;Bed Mobility;Stairs;Squat;Stand;Sit;Sleep    Examination-Participation Restrictions  Cleaning;Community Activity;Driving;Laundry;Yard Work;Meal Prep;Shop    Rehab Potential Good    PT Frequency 2x / week    PT Duration 6 weeks    PT Treatment/Interventions ADLs/Self Care Home Management;Aquatic Therapy;Cryotherapy;Electrical Stimulation;Iontophoresis 4mg /ml Dexamethasone;Moist Heat;Traction;Gait training;Stair training;Functional mobility training;Therapeutic activities;Therapeutic exercise;Balance training;Neuromuscular re-education;Manual techniques;Patient/family education;Dry needling;Passive range of motion;Taping    PT Next Visit Plan progress core/hip strength, manual as tolerated    PT Home Exercise Plan Access Code: [email or web address]    [email or web address] and Agree with Plan of Care Patient             Patient will benefit from skilled therapeutic intervention in order to improve the following deficits and impairments:  Abnormal gait, Decreased range of motion, Difficulty walking, Increased fascial restricitons, Increased muscle spasms, Decreased activity tolerance, Pain, Hypomobility, Decreased mobility, Decreased strength, Postural dysfunction  Visit Diagnosis: Muscle weakness (generalized)  Chronic right-sided low back pain with right-sided sciatica  Pain in right hip  Stiffness of right hip, not elsewhere classified  Other muscle spasm  Other abnormalities of gait and mobility     Problem List Patient Active Problem List   Diagnosis Date Noted   Other fatigue increased 06/01/2020   Atrial fibrillation ([place]) 04/23/2020   Vitamin D deficiency 04/08/2020   Hyperglycemia 03/12/2020   Essential hypertension 03/12/2020   Mixed hyperlipidemia 01/14/2020   Lower extremity edema 01/14/2020   Depression 01/14/2020   Class 1 obesity with serious comorbidity and body mass index (BMI) of 34.0 to 34.9 in adult 01/14/2020   Primary osteoarthritis of left knee 08/14/2018   Primary localized osteoarthritis of right knee 05/03/2016   Primary osteoarthritis of right knee  05/03/2016   Abnormal auditory perception of both ears 12/01/2015    [facility] April Gordy Levan, PT, DPT 09/02/2021, 9:39 AM  [facility] [place], [place], [address] Phone: [phone]   Fax:  [phone]  Name: SHONTA BOURQUE MRN: [id number] Date of Birth: 03/22/1951

## 2021-09-06 ENCOUNTER — Ambulatory Visit: Payer: Medicare HMO | Admitting: Physical Therapy

## 2021-09-06 ENCOUNTER — Other Ambulatory Visit: Payer: Self-pay

## 2021-09-06 ENCOUNTER — Encounter: Payer: Self-pay | Admitting: Physical Therapy

## 2021-09-06 DIAGNOSIS — R2689 Other abnormalities of gait and mobility: Secondary | ICD-10-CM | POA: Diagnosis not present

## 2021-09-06 DIAGNOSIS — M62838 Other muscle spasm: Secondary | ICD-10-CM | POA: Diagnosis not present

## 2021-09-06 DIAGNOSIS — M6281 Muscle weakness (generalized): Secondary | ICD-10-CM | POA: Diagnosis not present

## 2021-09-06 DIAGNOSIS — G8929 Other chronic pain: Secondary | ICD-10-CM

## 2021-09-06 DIAGNOSIS — M5441 Lumbago with sciatica, right side: Secondary | ICD-10-CM | POA: Diagnosis not present

## 2021-09-06 DIAGNOSIS — M25551 Pain in right hip: Secondary | ICD-10-CM | POA: Diagnosis not present

## 2021-09-06 DIAGNOSIS — M25651 Stiffness of right hip, not elsewhere classified: Secondary | ICD-10-CM | POA: Diagnosis not present

## 2021-09-06 NOTE — Therapy (Signed)
[place], [place], [address] Phone: [phone]   Fax:  [phone]  Physical Therapy Treatment  Patient Details  Name: Robin Christensen MRN: [id number] Date of Birth: 01/26/1951 Referring Provider (PT): Norva Karvonen   Encounter Date: 09/06/2021   PT End of Session - 09/06/21 0844     Visit Number 9    Number of Visits 12    Date for PT Re-Evaluation 09/20/21    Authorization Type [employer]    Authorization - Visit Number 9    Progress Note Due on Visit 10    PT Start Time 0803    PT Stop Time 0848    PT Time Calculation (min) 45 min    Activity Tolerance Patient tolerated treatment well    Behavior During Therapy [facility] for tasks assessed/performed             Past Medical History:  Diagnosis Date   A-fib ([place])    B12 deficiency    Back pain    Breast cancer ([place])    Cancer ([place])    Right Breast Cancer   Chronic headaches    Dysrhythmia    AF   Edema, lower extremity    Gallbladder disease    GERD (gastroesophageal reflux disease)    Hyperlipidemia    Hypertension    Hypothyroidism    Joint pain    Obesity    Pneumonia    PONV (postoperative nausea and vomiting)    Sleep apnea    cpcap   Vitamin deficiency     Past Surgical History:  Procedure Laterality Date   BREAST SURGERY     CARDIOVERSION N/A 06/26/2014   Procedure: CARDIOVERSION;  Surgeon: Laverda Page, MD;  Location: [place];  Service: Cardiovascular;  Laterality: N/A;   CATARACT EXTRACTION     CHOLECYSTECTOMY     DILATION AND CURETTAGE OF UTERUS     eyelid surgery     TOTAL KNEE ARTHROPLASTY Right 05/03/2016   Procedure: TOTAL KNEE ARTHROPLASTY;  Surgeon: Melrose Nakayama, MD;  Location: [place];  Service: Orthopedics;  Laterality: Right;   TOTAL KNEE ARTHROPLASTY Left 08/14/2018   Procedure: TOTAL KNEE ARTHROPLASTY;  Surgeon: Melrose Nakayama, MD;  Location: [place];  Service: Orthopedics;   Laterality: Left;    There were no vitals filed for this visit.   Subjective Assessment - 09/06/21 0805     Subjective Pt reports she "did pretty good" after last session, up until last night (2AM).  She tried self massage, medicine, walking around for relief.  "If I make the mistake of rolling onto my back, or my right side, it sets it off".    Patient Stated Goals decrease pain    Currently in Pain? Yes    Pain Score 4     Pain Location Hip    Pain Orientation Right    Pain Descriptors / Indicators Sore    Pain Radiating Towards to Rt 4th and 5th toe.    Aggravating Factors  prolonged sitting    Pain Relieving Factors exercises, meds                [facility] PT Assessment - 09/06/21 0001       Assessment   Medical Diagnosis R piriformis syndrome    Referring Provider (PT) Herminio Commons Dumonski    Hand Dominance Right    Next MD Visit PRN    Prior Therapy Back and L knee  Strength   Right Hip ABduction 4-/5      Palpation   Palpation comment Rt ASIS higher than Lt (supine)              [facility] Adult PT Treatment/Exercise - 09/06/21 0001       Lumbar Exercises: Stretches   Passive Hamstring Stretch Right;Left;3 reps;20 seconds seated, hip hinge   Passive Hamstring Stretch Limitations and RLE nerve glides in sitting.    Lower Trunk Rotation 3 reps;10 seconds    Piriformis Stretch Right;2 reps;30 seconds   supine, fig 4 with strap assist.   Other Lumbar Stretch Exercise butterfly stretch 2x30 sec      Lumbar Exercises: Aerobic   Tread Mill 1.5 x 5.5 min for warm up    Other Aerobic Exercise single laps around gym in between exercises to assess response to exercise and decrease overall stiffness.      Lumbar Exercises: Seated   Sit to Stand 5 reps    Sit to Stand Limitations with cues for hip hinge and knee flexion!      Lumbar Exercises: Supine   Bridge 5 reps   cues to engage core, squeeze glutes   Bridge Limitations limited tolerance      Lumbar Exercises:  Sidelying   Hip Abduction Right;10 reps    Other Sidelying Lumbar Exercises Lt sidelying:  clam position - knee and toe touches x 10 x 2 sets      Lumbar Exercises: Prone   Other Prone Lumbar Exercises MFR on ball to Rt ant hip x 3 min      Moist Heat Therapy   Number Minutes Moist Heat 10 Minutes    Moist Heat Location Hip      Electrical Stimulation   Electrical Stimulation Location Rt glute/ piriformis    Electrical Stimulation Action IFC    Electrical Stimulation Parameters 10 min, intensity to tolerance    Electrical Stimulation Goals Pain                PT Short Term Goals - 08/26/21 0904       PT SHORT TERM GOAL #1   Title independent with initial HEP    Time 3    Period Weeks    Status Achieved    Target Date 08/30/21      PT SHORT TERM GOAL #2   Title improve hip strength to at least 3+/5    Time 3    Period Weeks    Status Achieved    Target Date 08/30/21      PT SHORT TERM GOAL #3   Title pt will be able to walk for at least 10 minutes for exercise/increased activity    Time 3    Period Weeks    Status On-going    Target Date 08/30/21               PT Long Term Goals - 08/09/21 0848       PT LONG TERM GOAL #1   Title independent with HEP for hip and core strengthening    Time 6    Period Weeks    Status New    Target Date 09/20/21      PT LONG TERM GOAL #2   Title FOTO score improved to 59    Baseline 47    Time 6    Period Weeks    Status New    Target Date 09/20/21      PT LONG TERM GOAL #3  Title Pt will report >50% decrease in pain levels    Baseline 8 to 9/10 at worst    Time 6    Period Weeks    Status New    Target Date 09/20/21      PT LONG TERM GOAL #4   Title Pt will be able to increase hip strength to at least 4-/5 for improved stability with standing and walking    Time 6    Period Weeks    Status New    Target Date 09/20/21      PT LONG TERM GOAL #5   Title Pt will report improved sleep by at least 50%     Time 6    Period Weeks    Status New    Target Date 09/20/21                   Plan - 09/06/21 0844     Clinical Impression Statement Pt arrived with radicular symptoms down to Rt lateral foot.  She demonstrated improved Rt hip abdct strength, however fatigues quickly with sidelying exercises. Hamstrings remain tight.  Encouraged pt to bend at knees (using LEs) with sit to/from stand transition instead of flexing  from back and heavy use of UEs. Pt reported no increase in symptoms during exercise today.  Progressing gradually towards goals.    Personal Factors and Comorbidities Fitness;Age;Time since onset of injury/illness/exacerbation;Past/Current Experience    Examination-Activity Limitations Locomotion Level;Transfers;Bed Mobility;Stairs;Squat;Stand;Sit;Sleep    Examination-Participation Restrictions Cleaning;Community Activity;Driving;Laundry;Yard Work;Meal Prep;Shop    Rehab Potential Good    PT Frequency 2x / week    PT Duration 6 weeks    PT Treatment/Interventions ADLs/Self Care Home Management;Aquatic Therapy;Cryotherapy;Electrical Stimulation;Iontophoresis 4mg /ml Dexamethasone;Moist Heat;Traction;Gait training;Stair training;Functional mobility training;Therapeutic activities;Therapeutic exercise;Balance training;Neuromuscular re-education;Manual techniques;Patient/family education;Dry needling;Passive range of motion;Taping    PT Next Visit Plan progress core/hip strength, manual as tolerated.  Check goals - 10th visit note.   FOTO   PT Home Exercise Plan Access Code: [email or web address]    [email or web address] and Agree with Plan of Care Patient             Patient will benefit from skilled therapeutic intervention in order to improve the following deficits and impairments:  Abnormal gait, Decreased range of motion, Difficulty walking, Increased fascial restricitons, Increased muscle spasms, Decreased activity tolerance, Pain, Hypomobility, Decreased mobility, Decreased strength,  Postural dysfunction  Visit Diagnosis: Muscle weakness (generalized)  Chronic right-sided low back pain with right-sided sciatica  Pain in right hip  Stiffness of right hip, not elsewhere classified     Problem List Patient Active Problem List   Diagnosis Date Noted   Other fatigue increased 06/01/2020   Atrial fibrillation ([place]) 04/23/2020   Vitamin D deficiency 04/08/2020   Hyperglycemia 03/12/2020   Essential hypertension 03/12/2020   Mixed hyperlipidemia 01/14/2020   Lower extremity edema 01/14/2020   Depression 01/14/2020   Class 1 obesity with serious comorbidity and body mass index (BMI) of 34.0 to 34.9 in adult 01/14/2020   Primary osteoarthritis of left knee 08/14/2018   Primary localized osteoarthritis of right knee 05/03/2016   Primary osteoarthritis of right knee 05/03/2016   Abnormal auditory perception of both ears 12/01/2015   Kerin Perna, PTA 09/06/21 8:56 AM  [place], [place], [address] Phone: [phone]   Fax:  [phone]  Name: MELODY CIRRINCIONE MRN: [id number] Date of Birth: [birth year]

## 2021-09-09 ENCOUNTER — Ambulatory Visit: Payer: Medicare HMO | Admitting: Physical Therapy

## 2021-09-09 ENCOUNTER — Other Ambulatory Visit: Payer: Self-pay

## 2021-09-09 DIAGNOSIS — M25551 Pain in right hip: Secondary | ICD-10-CM

## 2021-09-09 DIAGNOSIS — M6281 Muscle weakness (generalized): Secondary | ICD-10-CM

## 2021-09-09 DIAGNOSIS — M5441 Lumbago with sciatica, right side: Secondary | ICD-10-CM | POA: Diagnosis not present

## 2021-09-09 DIAGNOSIS — M62838 Other muscle spasm: Secondary | ICD-10-CM | POA: Diagnosis not present

## 2021-09-09 DIAGNOSIS — M25651 Stiffness of right hip, not elsewhere classified: Secondary | ICD-10-CM

## 2021-09-09 DIAGNOSIS — G8929 Other chronic pain: Secondary | ICD-10-CM

## 2021-09-09 DIAGNOSIS — R2689 Other abnormalities of gait and mobility: Secondary | ICD-10-CM

## 2021-09-09 NOTE — Therapy (Signed)
[place], [place], [address] Phone: [phone]   Fax:  [phone]  Physical Therapy Treatment  Patient Details  Name: Robin Christensen MRN: [id number] Date of Birth: [birth year] Referring Provider (PT): Norva Karvonen   Encounter Date: 09/09/2021   PT End of Session - 09/09/21 0847     Visit Number 10    Number of Visits 12    Date for PT Re-Evaluation 09/20/21    Authorization Type [employer]    Authorization - Visit Number 10    Progress Note Due on Visit 10    PT Start Time 9574    PT Stop Time 0930    PT Time Calculation (min) 43 min    Activity Tolerance Patient tolerated treatment well    Behavior During Therapy [facility] for tasks assessed/performed             Past Medical History:  Diagnosis Date   A-fib ([place])    B12 deficiency    Back pain    Breast cancer ([place])    Cancer ([place])    Right Breast Cancer   Chronic headaches    Dysrhythmia    AF   Edema, lower extremity    Gallbladder disease    GERD (gastroesophageal reflux disease)    Hyperlipidemia    Hypertension    Hypothyroidism    Joint pain    Obesity    Pneumonia    PONV (postoperative nausea and vomiting)    Sleep apnea    cpcap   Vitamin deficiency     Past Surgical History:  Procedure Laterality Date   BREAST SURGERY     CARDIOVERSION N/A 06/26/2014   Procedure: CARDIOVERSION;  Surgeon: Laverda Page, MD;  Location: [place];  Service: Cardiovascular;  Laterality: N/A;   CATARACT EXTRACTION     CHOLECYSTECTOMY     DILATION AND CURETTAGE OF UTERUS     eyelid surgery     TOTAL KNEE ARTHROPLASTY Right 05/03/2016   Procedure: TOTAL KNEE ARTHROPLASTY;  Surgeon: Melrose Nakayama, MD;  Location: [place];  Service: Orthopedics;  Laterality: Right;   TOTAL KNEE ARTHROPLASTY Left 08/14/2018   Procedure: TOTAL KNEE ARTHROPLASTY;  Surgeon: Melrose Nakayama, MD;  Location: [place];  Service: Orthopedics;   Laterality: Left;    There were no vitals filed for this visit.   Subjective Assessment - 09/09/21 0850     Subjective "I'm not burning today." Pt reports she had 2 outtings yesterday but she went to bed and woke up and wasn't burning.    Limitations Sitting;Walking;Standing    How long can you sit comfortably? Maybe 20-30 minutes    How long can you stand comfortably? Maybe 20-30 minutes    How long can you walk comfortably? 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 No impingement to explain leg symptoms.    Patient Stated Goals decrease pain    Currently in Pain? Yes    Pain Score 3     Pain Location Hip    Pain Orientation Right                               [facility] Adult PT Treatment/Exercise - 09/09/21 0001       Lumbar Exercises: Stretches   Hip Flexor Stretch 30 seconds    Hip Flexor Stretch Limitations on steps  Piriformis Stretch Right;2 reps;30 seconds    Piriformis Stretch Limitations seated    Figure 4 Stretch 2 reps;30 seconds;Seated    Other Lumbar Stretch Exercise butterfly stretch 2x30 sec    Other Lumbar Stretch Exercise R lateral pelvic shift into wall x30 sec      Lumbar Exercises: Aerobic   Nustep L5 x 5 min for warm up    Other Aerobic Exercise single lap around gym forward, single lap around backward      Lumbar Exercises: Seated   Sit to Stand 5 reps    Sit to Stand Limitations with cues for hip hinge and knee flexion!    Other Seated Lumbar Exercises sciatic nerve glide x10      Lumbar Exercises: Supine   Other Supine Lumbar Exercises sciatic nerve glide x10      Lumbar Exercises: Sidelying   Hip Abduction Right;20 reps    Other Sidelying Lumbar Exercises Lt sidelying:  clam position - knee and toe touches x 10 x 2 sets                       PT Short Term Goals - 08/26/21 0904       PT SHORT TERM GOAL #1   Title independent with initial HEP    Time 3     Period Weeks    Status Achieved    Target Date 08/30/21      PT SHORT TERM GOAL #2   Title improve hip strength to at least 3+/5    Time 3    Period Weeks    Status Achieved    Target Date 08/30/21      PT SHORT TERM GOAL #3   Title pt will be able to walk for at least 10 minutes for exercise/increased activity    Time 3    Period Weeks    Status On-going    Target Date 08/30/21               PT Long Term Goals - 09/09/21 0926       PT LONG TERM GOAL #1   Title independent with HEP for hip and core strengthening    Time 6    Period Weeks    Status New    Target Date 09/20/21      PT LONG TERM GOAL #2   Title FOTO score improved to 59    Baseline 47    Time 6    Period Weeks    Status New    Target Date 09/20/21      PT LONG TERM GOAL #3   Title Pt will report >50% decrease in pain levels    Baseline 6 to 7 at worst;    Time 6    Period Weeks    Status On-going    Target Date 09/20/21      PT LONG TERM GOAL #4   Title Pt will be able to increase hip strength to at least 4-/5 for improved stability with standing and walking    Time 6    Period Weeks    Status Achieved    Target Date 09/20/21      PT LONG TERM GOAL #5   Title Pt will report improved sleep by at least 50%    Baseline Improved by 50% -- not consistently though    Time 6    Period Weeks    Status Partially Met    Target Date 09/20/21  Patient will benefit from skilled therapeutic intervention in order to improve the following deficits and impairments:     Visit Diagnosis: Muscle weakness (generalized)  Chronic right-sided low back pain with right-sided sciatica  Pain in right hip  Stiffness of right hip, not elsewhere classified  Other muscle spasm  Other abnormalities of gait and mobility     Problem List Patient Active Problem List   Diagnosis Date Noted   Other fatigue increased 06/01/2020   Atrial fibrillation ([place]) 04/23/2020    Vitamin D deficiency 04/08/2020   Hyperglycemia 03/12/2020   Essential hypertension 03/12/2020   Mixed hyperlipidemia 01/14/2020   Lower extremity edema 01/14/2020   Depression 01/14/2020   Class 1 obesity with serious comorbidity and body mass index (BMI) of 34.0 to 34.9 in adult 01/14/2020   Primary osteoarthritis of left knee 08/14/2018   Primary localized osteoarthritis of right knee 05/03/2016   Primary osteoarthritis of right knee 05/03/2016   Abnormal auditory perception of both ears 12/01/2015    [facility] April Ma L Ean Gettel, PT, DPT 09/09/2021, 1:32 PM  [facility] [place], [place], [address] Phone: [phone]   Fax:  [phone]  Name: Robin Christensen MRN: [id number] Date of Birth: 1951/02/20

## 2021-09-13 ENCOUNTER — Other Ambulatory Visit: Payer: Self-pay

## 2021-09-13 ENCOUNTER — Ambulatory Visit: Payer: Medicare HMO | Admitting: Physical Therapy

## 2021-09-13 DIAGNOSIS — M6281 Muscle weakness (generalized): Secondary | ICD-10-CM

## 2021-09-13 DIAGNOSIS — M62838 Other muscle spasm: Secondary | ICD-10-CM

## 2021-09-13 DIAGNOSIS — R2689 Other abnormalities of gait and mobility: Secondary | ICD-10-CM | POA: Diagnosis not present

## 2021-09-13 DIAGNOSIS — G8929 Other chronic pain: Secondary | ICD-10-CM | POA: Diagnosis not present

## 2021-09-13 DIAGNOSIS — M25651 Stiffness of right hip, not elsewhere classified: Secondary | ICD-10-CM | POA: Diagnosis not present

## 2021-09-13 DIAGNOSIS — M25551 Pain in right hip: Secondary | ICD-10-CM | POA: Diagnosis not present

## 2021-09-13 DIAGNOSIS — M5441 Lumbago with sciatica, right side: Secondary | ICD-10-CM | POA: Diagnosis not present

## 2021-09-13 NOTE — Therapy (Signed)
[place], [place], [address] Phone: [phone]   Fax:  [phone]  Physical Therapy Treatment  Patient Details  Name: Robin Christensen MRN: [id number] Date of Birth: 1951-01-29 Referring Provider (PT): Norva Karvonen   Encounter Date: 09/13/2021   PT End of Session - 09/13/21 0843     Visit Number 11    Number of Visits 12    Date for PT Re-Evaluation 09/20/21    Authorization Type [employer]    Authorization - Visit Number 11    Progress Note Due on Visit 10    PT Start Time 0843    PT Stop Time 0925    PT Time Calculation (min) 42 min    Activity Tolerance Patient tolerated treatment well    Behavior During Therapy [facility] for tasks assessed/performed             Past Medical History:  Diagnosis Date   A-fib ([place])    B12 deficiency    Back pain    Breast cancer ([place])    Cancer ([place])    Right Breast Cancer   Chronic headaches    Dysrhythmia    AF   Edema, lower extremity    Gallbladder disease    GERD (gastroesophageal reflux disease)    Hyperlipidemia    Hypertension    Hypothyroidism    Joint pain    Obesity    Pneumonia    PONV (postoperative nausea and vomiting)    Sleep apnea    cpcap   Vitamin deficiency     Past Surgical History:  Procedure Laterality Date   BREAST SURGERY     CARDIOVERSION N/A 06/26/2014   Procedure: CARDIOVERSION;  Surgeon: Laverda Page, MD;  Location: [facility];  Service: Cardiovascular;  Laterality: N/A;   CATARACT EXTRACTION     CHOLECYSTECTOMY     DILATION AND CURETTAGE OF UTERUS     eyelid surgery     TOTAL KNEE ARTHROPLASTY Right 05/03/2016   Procedure: TOTAL KNEE ARTHROPLASTY;  Surgeon: Melrose Nakayama, MD;  Location: [place];  Service: Orthopedics;  Laterality: Right;   TOTAL KNEE ARTHROPLASTY Left 08/14/2018   Procedure: TOTAL KNEE ARTHROPLASTY;  Surgeon: Melrose Nakayama, MD;  Location: [place];  Service: Orthopedics;   Laterality: Left;    There were no vitals filed for this visit.   Subjective Assessment - 09/13/21 0848     Subjective Pt states she had a bad last night. Pt reports she plans to get a new mattress. Pt states she had to sleep in recliner for a few hours.    Limitations Sitting;Walking;Standing    How long can you sit comfortably? Maybe 20-30 minutes    How long can you stand comfortably? Maybe 20-30 minutes    How long can you walk comfortably? 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 No impingement to explain leg symptoms.    Patient Stated Goals decrease pain    Currently in Pain? Yes    Pain Score 5     Pain Location Hip    Pain Orientation Right    Pain Descriptors / Indicators Sore    Pain Type Chronic pain    Pain Onset More than a month ago                [facility] PT Assessment - 09/13/21 0001       Assessment   Medical Diagnosis  R piriformis syndrome    Referring Provider (PT) Norva Karvonen                           [facility] Adult PT Treatment/Exercise - 09/13/21 0001       Lumbar Exercises: Stretches   Figure 4 Stretch 2 reps;30 seconds;Seated    Other Lumbar Stretch Exercise Seated hip adductor stretch 2x30 sec    Other Lumbar Stretch Exercise R lateral pelvic shift into wall 2x30 sec      Lumbar Exercises: Aerobic   Tread Mill 1.5 x 5 min for warm up      Lumbar Exercises: Standing   Wall Slides 20 reps    Other Standing Lumbar Exercises Hip hike 2x10      Lumbar Exercises: Sidelying   Hip Abduction Right;20 reps    Other Sidelying Lumbar Exercises Lt sidelying:  clam position - knee and toe touches x 10 x 2 sets                       PT Short Term Goals - 08/26/21 0904       PT SHORT TERM GOAL #1   Title independent with initial HEP    Time 3    Period Weeks    Status Achieved    Target Date 08/30/21      PT SHORT TERM GOAL #2   Title improve hip strength to  at least 3+/5    Time 3    Period Weeks    Status Achieved    Target Date 08/30/21      PT SHORT TERM GOAL #3   Title pt will be able to walk for at least 10 minutes for exercise/increased activity    Time 3    Period Weeks    Status On-going    Target Date 08/30/21               PT Long Term Goals - 09/09/21 0926       PT LONG TERM GOAL #1   Title independent with HEP for hip and core strengthening    Time 6    Period Weeks    Status New    Target Date 09/20/21      PT LONG TERM GOAL #2   Title FOTO score improved to 59    Baseline 47    Time 6    Period Weeks    Status New    Target Date 09/20/21      PT LONG TERM GOAL #3   Title Pt will report >50% decrease in pain levels    Baseline 6 to 7 at worst;    Time 6    Period Weeks    Status On-going    Target Date 09/20/21      PT LONG TERM GOAL #4   Title Pt will be able to increase hip strength to at least 4-/5 for improved stability with standing and walking    Time 6    Period Weeks    Status Achieved    Target Date 09/20/21      PT LONG TERM GOAL #5   Title Pt will report improved sleep by at least 50%    Baseline Improved by 50% -- not consistently though    Time 6    Period Weeks    Status Partially Met    Target Date 09/20/21  Plan - 09/13/21 0932     Clinical Impression Statement Continued to work on increasing hip/pelvis strength. Provided manual work -- most of trigger points now along superior and lateral glutes. Working on improving symmetry in standing (R iliac crest is higher than L).    Personal Factors and Comorbidities Fitness;Age;Time since onset of injury/illness/exacerbation;Past/Current Experience    Examination-Activity Limitations Locomotion Level;Transfers;Bed Mobility;Stairs;Squat;Stand;Sit;Sleep    Examination-Participation Restrictions Cleaning;Community Activity;Driving;Laundry;Yard Work;Meal Prep;Shop    Rehab Potential Good    PT Frequency 2x  / week    PT Duration 6 weeks    PT Treatment/Interventions ADLs/Self Care Home Management;Aquatic Therapy;Cryotherapy;Electrical Stimulation;Iontophoresis 67m/ml Dexamethasone;Moist Heat;Traction;Gait training;Stair training;Functional mobility training;Therapeutic activities;Therapeutic exercise;Balance training;Neuromuscular re-education;Manual techniques;Patient/family education;Dry needling;Passive range of motion;Taping    PT Next Visit Plan progress core/hip strength, manual as tolerated.    PT Home Exercise Plan Access Code: [email or web address]    [email or web address] Agree with Plan of Care Patient             Patient will benefit from skilled therapeutic intervention in order to improve the following deficits and impairments:  Abnormal gait, Decreased range of motion, Difficulty walking, Increased fascial restricitons, Increased muscle spasms, Decreased activity tolerance, Pain, Hypomobility, Decreased mobility, Decreased strength, Postural dysfunction  Visit Diagnosis: Muscle weakness (generalized)  Chronic right-sided low back pain with right-sided sciatica  Pain in right hip  Stiffness of right hip, not elsewhere classified  Other muscle spasm  Other abnormalities of gait and mobility     Problem List Patient Active Problem List   Diagnosis Date Noted   Other fatigue increased 06/01/2020   Atrial fibrillation ([place] 04/23/2020   Vitamin D deficiency 04/08/2020   Hyperglycemia 03/12/2020   Essential hypertension 03/12/2020   Mixed hyperlipidemia 01/14/2020   Lower extremity edema 01/14/2020   Depression 01/14/2020   Class 1 obesity with serious comorbidity and body mass index (BMI) of 34.0 to 34.9 in adult 01/14/2020   Primary osteoarthritis of left knee 08/14/2018   Primary localized osteoarthritis of right knee 05/03/2016   Primary osteoarthritis of right knee 05/03/2016   Abnormal auditory perception of both ears 12/01/2015    GEdmond -Amg Specialty HospitalApril Ma L Drew Herman, PVirgin[place] DPT 09/13/2021,  12:49 PM  [facility] [address]ice[place]le[place] Garden[place] [address]Phone: [phone]  Fax:  [phone] Name: Robin MABEYMRN: [id number] of Birth: 217-Jul-1952

## 2021-09-14 ENCOUNTER — Encounter: Payer: Medicare HMO | Admitting: Physical Therapy

## 2021-09-15 ENCOUNTER — Ambulatory Visit: Payer: Medicare HMO | Attending: Orthopedic Surgery | Admitting: Physical Therapy

## 2021-09-15 ENCOUNTER — Other Ambulatory Visit: Payer: Self-pay

## 2021-09-15 DIAGNOSIS — G8929 Other chronic pain: Secondary | ICD-10-CM | POA: Diagnosis not present

## 2021-09-15 DIAGNOSIS — M6281 Muscle weakness (generalized): Secondary | ICD-10-CM | POA: Insufficient documentation

## 2021-09-15 DIAGNOSIS — M62838 Other muscle spasm: Secondary | ICD-10-CM | POA: Diagnosis not present

## 2021-09-15 DIAGNOSIS — R2689 Other abnormalities of gait and mobility: Secondary | ICD-10-CM | POA: Insufficient documentation

## 2021-09-15 DIAGNOSIS — M5441 Lumbago with sciatica, right side: Secondary | ICD-10-CM | POA: Diagnosis not present

## 2021-09-15 DIAGNOSIS — M25651 Stiffness of right hip, not elsewhere classified: Secondary | ICD-10-CM | POA: Diagnosis not present

## 2021-09-15 DIAGNOSIS — M25551 Pain in right hip: Secondary | ICD-10-CM | POA: Insufficient documentation

## 2021-09-15 NOTE — Therapy (Signed)
[place], [place], [address] Phone: [phone]   Fax:  [phone]  Physical Therapy Treatment  Patient Details  Name: Robin Christensen MRN: [id number] Date of Birth: 1950/08/12 Referring Provider (PT): Norva Karvonen   Encounter Date: 09/15/2021   PT End of Session - 09/15/21 0846     Visit Number 12    Number of Visits 12    Date for PT Re-Evaluation 09/20/21    Authorization Type [employer]    Authorization - Visit Number 12    Progress Note Due on Visit 10    PT Start Time [phone]    PT Stop Time 0930    PT Time Calculation (min) 44 min    Activity Tolerance Patient tolerated treatment well    Behavior During Therapy [facility] for tasks assessed/performed             Past Medical History:  Diagnosis Date   A-fib ([place])    B12 deficiency    Back pain    Breast cancer ([place])    Cancer ([place])    Right Breast Cancer   Chronic headaches    Dysrhythmia    AF   Edema, lower extremity    Gallbladder disease    GERD (gastroesophageal reflux disease)    Hyperlipidemia    Hypertension    Hypothyroidism    Joint pain    Obesity    Pneumonia    PONV (postoperative nausea and vomiting)    Sleep apnea    cpcap   Vitamin deficiency     Past Surgical History:  Procedure Laterality Date   BREAST SURGERY     CARDIOVERSION N/A 06/26/2014   Procedure: CARDIOVERSION;  Surgeon: Laverda Page, MD;  Location: [place];  Service: Cardiovascular;  Laterality: N/A;   CATARACT EXTRACTION     CHOLECYSTECTOMY     DILATION AND CURETTAGE OF UTERUS     eyelid surgery     TOTAL KNEE ARTHROPLASTY Right 05/03/2016   Procedure: TOTAL KNEE ARTHROPLASTY;  Surgeon: Melrose Nakayama, MD;  Location: [place];  Service: Orthopedics;  Laterality: Right;   TOTAL KNEE ARTHROPLASTY Left 08/14/2018   Procedure: TOTAL KNEE ARTHROPLASTY;  Surgeon: Melrose Nakayama, MD;  Location: [place];  Service: Orthopedics;   Laterality: Left;    There were no vitals filed for this visit.   Subjective Assessment - 09/15/21 0850     Subjective Pt reports increased exacerbation more throughout the whole back today. She reports feeling pretty good on Monday; however, on Tuesday and this morning she has had a bad day. She notes pain creeping up into midback and neck. Less noted in her hip. She reports she had been trying different mattresses to test out which one would be best for her at home.    Limitations Sitting;Walking;Standing    How long can you sit comfortably? Maybe 20-30 minutes    How long can you stand comfortably? Maybe 20-30 minutes    How long can you walk comfortably? 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 No impingement to explain leg symptoms.    Patient Stated Goals decrease pain    Currently in Pain? Yes    Pain Score 10-Worst pain ever    Pain Location Back    Pain Type Chronic pain    Pain Onset More than a month ago  [facility] PT Assessment - 09/15/21 0001       Assessment   Medical Diagnosis R piriformis syndrome    Referring Provider (PT) Norva Karvonen                           [facility] Adult PT Treatment/Exercise - 09/15/21 0001       Lumbar Exercises: Stretches   Passive Hamstring Stretch Right;Left;20 seconds    Passive Hamstring Stretch Limitations seated    Piriformis Stretch Right;2 reps;30 seconds    Piriformis Stretch Limitations seated    Figure 4 Stretch 2 reps;30 seconds;Seated    Figure 4 Stretch Limitations seated      Lumbar Exercises: Aerobic   Nustep L4, only able to tolerate 4 min today due to increased pain levels      Lumbar Exercises: Seated   Other Seated Lumbar Exercises Seated gentle QL mobilization (side flexion + rotation with elbows bending/straightening) x10 each side    Other Seated Lumbar Exercises lumbar flexion with pball 5x10 sec holds, lateral flexion  2x20 sec holds, thoracic extension against pball with therapist manual assist 2x30 sec      Lumbar Exercises: Sidelying   Other Sidelying Lumbar Exercises Gentle sacral rotation forward/backward x10      Electrical Stimulation   Electrical Stimulation Location bilat lumbar paraspinals    Electrical Stimulation Action TENS    Electrical Stimulation Parameters 15 min throughout stretches    Electrical Stimulation Goals Pain      Manual Therapy   Soft tissue mobilization STM & TPR lumbar paraspinals and R glute/piriformis                       PT Short Term Goals - 08/26/21 0904       PT SHORT TERM GOAL #1   Title independent with initial HEP    Time 3    Period Weeks    Status Achieved    Target Date 08/30/21      PT SHORT TERM GOAL #2   Title improve hip strength to at least 3+/5    Time 3    Period Weeks    Status Achieved    Target Date 08/30/21      PT SHORT TERM GOAL #3   Title pt will be able to walk for at least 10 minutes for exercise/increased activity    Time 3    Period Weeks    Status On-going    Target Date 08/30/21               PT Long Term Goals - 09/09/21 0926       PT LONG TERM GOAL #1   Title independent with HEP for hip and core strengthening    Time 6    Period Weeks    Status New    Target Date 09/20/21      PT LONG TERM GOAL #2   Title FOTO score improved to 59    Baseline 47    Time 6    Period Weeks    Status New    Target Date 09/20/21      PT LONG TERM GOAL #3   Title Pt will report >50% decrease in pain levels    Baseline 6 to 7 at worst;    Time 6    Period Weeks    Status On-going    Target Date 09/20/21      PT  LONG TERM GOAL #4   Title Pt will be able to increase hip strength to at least 4-/5 for improved stability with standing and walking    Time 6    Period Weeks    Status Achieved    Target Date 09/20/21      PT LONG TERM GOAL #5   Title Pt will report improved sleep by at least 50%     Baseline Improved by 50% -- not consistently though    Time 6    Period Weeks    Status Partially Met    Target Date 09/20/21                   Plan - 09/15/21 0931     Clinical Impression Statement Treatment session focused primarily on decreasing pt's pain throughout spine. Gentle stretching with e-stim and heat provided. Improved mid back pain by end of session. Continued tightness along lumbar paraspinals. Less tenderness in mid piriformis -- now more along insertion towards greater trochanter and upper glutes.    Personal Factors and Comorbidities Fitness;Age;Time since onset of injury/illness/exacerbation;Past/Current Experience    Examination-Activity Limitations Locomotion Level;Transfers;Bed Mobility;Stairs;Squat;Stand;Sit;Sleep    Examination-Participation Restrictions Cleaning;Community Activity;Driving;Laundry;Yard Work;Meal Prep;Shop    Rehab Potential Good    PT Frequency 2x / week    PT Duration 6 weeks    PT Treatment/Interventions ADLs/Self Care Home Management;Aquatic Therapy;Cryotherapy;Electrical Stimulation;Iontophoresis 66m/ml Dexamethasone;Moist Heat;Traction;Gait training;Stair training;Functional mobility training;Therapeutic activities;Therapeutic exercise;Balance training;Neuromuscular re-education;Manual techniques;Patient/family education;Dry needling;Passive range of motion;Taping    PT Next Visit Plan Re-cert next session for [employer] and POC. Progress core/hip strength, manual as tolerated.    PT Home Exercise Plan Access Code: [email or web address]    [email or web address] Agree with Plan of Care Patient             Patient will benefit from skilled therapeutic intervention in order to improve the following deficits and impairments:  Abnormal gait, Decreased range of motion, Difficulty walking, Increased fascial restricitons, Increased muscle spasms, Decreased activity tolerance, Pain, Hypomobility, Decreased mobility, Decreased strength, Postural dysfunction  Visit  Diagnosis: Muscle weakness (generalized)  Chronic right-sided low back pain with right-sided sciatica  Pain in right hip  Stiffness of right hip, not elsewhere classified  Other muscle spasm  Other abnormalities of gait and mobility     Problem List Patient Active Problem List   Diagnosis Date Noted   Other fatigue increased 06/01/2020   Atrial fibrillation ([place] 04/23/2020   Vitamin D deficiency 04/08/2020   Hyperglycemia 03/12/2020   Essential hypertension 03/12/2020   Mixed hyperlipidemia 01/14/2020   Lower extremity edema 01/14/2020   Depression 01/14/2020   Class 1 obesity with serious comorbidity and body mass index (BMI) of 34.0 to 34.9 in adult 01/14/2020   Primary osteoarthritis of left knee 08/14/2018   Primary localized osteoarthritis of right knee 05/03/2016   Primary osteoarthritis of right knee 05/03/2016   Abnormal auditory perception of both ears 12/01/2015    GAll City Family Healthcare Center IncApril Ma L Nareg Breighner, PT, DPT 09/15/2021, 11:27 AM  [facility]SH[address] Grove[place] [address]Phone: [phone]  Fax:  [phone] Name: Robin BLUMENTHALMRN: [id number] of Birth: 209/06/1951

## 2021-09-16 ENCOUNTER — Encounter: Payer: Medicare HMO | Admitting: Physical Therapy

## 2021-09-16 DIAGNOSIS — R350 Frequency of micturition: Secondary | ICD-10-CM | POA: Diagnosis not present

## 2021-09-16 DIAGNOSIS — N3946 Mixed incontinence: Secondary | ICD-10-CM | POA: Diagnosis not present

## 2021-09-20 ENCOUNTER — Other Ambulatory Visit: Payer: Self-pay

## 2021-09-20 ENCOUNTER — Ambulatory Visit: Payer: Medicare HMO | Admitting: Physical Therapy

## 2021-09-20 DIAGNOSIS — R2689 Other abnormalities of gait and mobility: Secondary | ICD-10-CM | POA: Diagnosis not present

## 2021-09-20 DIAGNOSIS — M25551 Pain in right hip: Secondary | ICD-10-CM

## 2021-09-20 DIAGNOSIS — M25651 Stiffness of right hip, not elsewhere classified: Secondary | ICD-10-CM | POA: Diagnosis not present

## 2021-09-20 DIAGNOSIS — G8929 Other chronic pain: Secondary | ICD-10-CM

## 2021-09-20 DIAGNOSIS — M5441 Lumbago with sciatica, right side: Secondary | ICD-10-CM | POA: Diagnosis not present

## 2021-09-20 DIAGNOSIS — M6281 Muscle weakness (generalized): Secondary | ICD-10-CM

## 2021-09-20 DIAGNOSIS — M62838 Other muscle spasm: Secondary | ICD-10-CM | POA: Diagnosis not present

## 2021-09-20 NOTE — Therapy (Signed)
[facility] [address] [place], [place], [address] Phone: [phone]   Fax:  [phone]  Physical Therapy Treatment and Re-Certification  Patient Details  Name: Robin Christensen MRN: [id number] Date of Birth: 02-Apr-1951 Referring Provider (PT): Jackelyn Hoehn   Encounter Date: 09/20/2021   PT End of Session - 09/20/21 0845     Visit Number 13    Number of Visits 24    Date for PT Re-Evaluation 11/01/21    Authorization Type [employer]    Authorization - Visit Number 13    Progress Note Due on Visit 20    PT Start Time 0846    PT Stop Time 0925    PT Time Calculation (min) 39 min    Activity Tolerance Patient tolerated treatment well    Behavior During Therapy [facility] for tasks assessed/performed             Past Medical History:  Diagnosis Date   A-fib (HCC)    B12 deficiency    Back pain    Breast cancer (HCC)    Cancer (HCC)    Right Breast Cancer   Chronic headaches    Dysrhythmia    AF   Edema, lower extremity    Gallbladder disease    GERD (gastroesophageal reflux disease)    Hyperlipidemia    Hypertension    Hypothyroidism    Joint pain    Obesity    Pneumonia    PONV (postoperative nausea and vomiting)    Sleep apnea    cpcap   Vitamin deficiency     Past Surgical History:  Procedure Laterality Date   BREAST SURGERY     CARDIOVERSION N/A 06/26/2014   Procedure: CARDIOVERSION;  Surgeon: Pamella Pert, MD;  Location: [facility] ENDOSCOPY;  Service: Cardiovascular;  Laterality: N/A;   CATARACT EXTRACTION     CHOLECYSTECTOMY     DILATION AND CURETTAGE OF UTERUS     eyelid surgery     TOTAL KNEE ARTHROPLASTY Right 05/03/2016   Procedure: TOTAL KNEE ARTHROPLASTY;  Surgeon: Marcene Corning, MD;  Location: [facility] OR;  Service: Orthopedics;  Laterality: Right;   TOTAL KNEE ARTHROPLASTY Left 08/14/2018   Procedure: TOTAL KNEE ARTHROPLASTY;  Surgeon: Marcene Corning, MD;  Location: [facility] OR;   Service: Orthopedics;  Laterality: Left;    There were no vitals filed for this visit.   Subjective Assessment - 09/20/21 0848     Subjective Pt states she is feeling better today than last week.    Limitations Sitting;Walking;Standing    How long can you sit comfortably? Maybe 20-30 minutes    How long can you stand comfortably? Maybe 20-30 minutes    How long can you walk comfortably? 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 No impingement to explain leg symptoms.    Patient Stated Goals decrease pain    Currently in Pain? Yes    Pain Score 6     Pain Location Back    Pain Orientation Left    Pain Descriptors / Indicators Sore    Pain Type Chronic pain    Pain Onset More than a month ago                [facility] PT Assessment - 09/20/21 0001       Assessment   Medical Diagnosis R piriformis syndrome    Referring Provider (PT) Jackelyn Hoehn  Strength   Right Hip Extension 3/5    Right Hip ABduction 4-/5    Left Hip Extension 3+/5    Left Hip ABduction 3+/5                           [facility] Adult PT Treatment/Exercise - 09/20/21 0001       Lumbar Exercises: Stretches   Passive Hamstring Stretch Right;Left;20 seconds    Piriformis Stretch 30 seconds;Right;Left    Piriformis Stretch Limitations seated    Figure 4 Stretch 30 seconds;Seated    Figure 4 Stretch Limitations seated    Other Lumbar Stretch Exercise seated cat cow x10    Other Lumbar Stretch Exercise pball roll lateral flexion 5x10 sec; sidelying QL stretch with L LE flexed and in IR x1 min      Lumbar Exercises: Aerobic   Nustep L5 x 5 min LEs & UEs      Lumbar Exercises: Sidelying   Clam 10 reps    Hip Abduction 10 reps    Other Sidelying Lumbar Exercises reverse clam x10 each side      Manual Therapy   Joint Mobilization Gentle PA mobs along lateral border of sacrum    Soft tissue mobilization STM & TPR lumbar paraspinals  and L glute                       PT Short Term Goals - 09/20/21 1032       PT SHORT TERM GOAL #1   Title independent with initial HEP    Time 3    Period Weeks    Status Achieved    Target Date 08/30/21      PT SHORT TERM GOAL #2   Title improve hip strength to at least 3+/5    Time 3    Period Weeks    Status Achieved    Target Date 08/30/21      PT SHORT TERM GOAL #3   Title pt will be able to walk for at least 10 minutes for exercise/increased activity    Time 3    Period Weeks    Status Achieved    Target Date 08/30/21               PT Long Term Goals - 09/20/21 0910       PT LONG TERM GOAL #1   Title independent with HEP for hip and core strengthening    Time 6    Period Weeks    Status New    Target Date 11/01/21      PT LONG TERM GOAL #2   Title FOTO score improved to 59    Baseline 47    Time 6    Period Weeks    Status New    Target Date 11/01/21      PT LONG TERM GOAL #3   Title Pt will report >50% decrease in pain levels    Baseline 6 to 7 at worst; exacerbation last week with pain up to 10/10 (09/20/21)    Time 6    Period Weeks    Status On-going    Target Date 11/01/21      PT LONG TERM GOAL #4   Title Pt will be able to increase hip strength to at least 4-/5 for improved stability with standing and walking    Time 6    Period Weeks    Status Partially Met  Target Date 09/20/21      PT LONG TERM GOAL #5   Title Pt will report improved sleep by at least 50%    Baseline Improved by 50% -- not consistently though    Time 6    Period Weeks    Status Partially Met    Target Date 11/01/21                   Plan - 09/20/21 5409     Clinical Impression Statement Pt states that she can feel she is improving from therapy but she is still recovering from a recent exacerbation in her pain. Pt also notes having to go up/down her stairs more in the past few days to check on her cat. Treatment focused on continued  manual work and stretching through her lumbar paraspinals, QL and glutes. Pt is demonstrating a half grade increase in her hip strength; however, remains weak and not yet to goal (Unsure if this is due to her increased pain level today). Pt would benefit from continued PT to work on meeting her LTGs.    Personal Factors and Comorbidities Fitness;Age;Time since onset of injury/illness/exacerbation;Past/Current Experience    Examination-Activity Limitations Locomotion Level;Transfers;Bed Mobility;Stairs;Squat;Stand;Sit;Sleep    Examination-Participation Restrictions Cleaning;Community Activity;Driving;Laundry;Yard Work;Meal Prep;Shop    Stability/Clinical Decision Making Evolving/Moderate complexity    Rehab Potential Good    PT Frequency 2x / week    PT Duration 6 weeks    PT Treatment/Interventions ADLs/Self Care Home Management;Aquatic Therapy;Cryotherapy;Electrical Stimulation;Iontophoresis 4mg /ml Dexamethasone;Moist Heat;Traction;Gait training;Stair training;Functional mobility training;Therapeutic activities;Therapeutic exercise;Balance training;Neuromuscular re-education;Manual techniques;Patient/family education;Dry needling;Passive range of motion;Taping    PT Next Visit Plan Progress core and hip strengthening. Work on Print production planner on steps    PT Home Exercise Plan Access Code: [email or web address]    Consulted and Agree with Plan of Care Patient             Patient will benefit from skilled therapeutic intervention in order to improve the following deficits and impairments:  Abnormal gait, Decreased range of motion, Difficulty walking, Increased fascial restricitons, Increased muscle spasms, Decreased activity tolerance, Pain, Hypomobility, Decreased mobility, Decreased strength, Postural dysfunction  Visit Diagnosis: Muscle weakness (generalized)  Chronic right-sided low back pain with right-sided sciatica  Pain in right hip  Stiffness of right hip, not elsewhere classified  Other muscle  spasm  Other abnormalities of gait and mobility     Problem List Patient Active Problem List   Diagnosis Date Noted   Other fatigue increased 06/01/2020   Atrial fibrillation (HCC) 04/23/2020   Vitamin D deficiency 04/08/2020   Hyperglycemia 03/12/2020   Essential hypertension 03/12/2020   Mixed hyperlipidemia 01/14/2020   Lower extremity edema 01/14/2020   Depression 01/14/2020   Class 1 obesity with serious comorbidity and body mass index (BMI) of 34.0 to 34.9 in adult 01/14/2020   Primary osteoarthritis of left knee 08/14/2018   Primary localized osteoarthritis of right knee 05/03/2016   Primary osteoarthritis of right knee 05/03/2016   Abnormal auditory perception of both ears 12/01/2015    [facility] April Dell Ponto, PT, DPT 09/20/2021, 10:36 AM  [facility] [address] [place], [place], [address] Phone: [phone]   Fax:  [phone]  Name: CAROLAN LIFE MRN: [id number] Date of Birth: 05/18/1951

## 2021-09-29 ENCOUNTER — Ambulatory Visit: Payer: Medicare HMO | Admitting: Physical Therapy

## 2021-09-29 ENCOUNTER — Other Ambulatory Visit: Payer: Self-pay

## 2021-09-29 DIAGNOSIS — M25551 Pain in right hip: Secondary | ICD-10-CM | POA: Diagnosis not present

## 2021-09-29 DIAGNOSIS — M25651 Stiffness of right hip, not elsewhere classified: Secondary | ICD-10-CM

## 2021-09-29 DIAGNOSIS — M5441 Lumbago with sciatica, right side: Secondary | ICD-10-CM | POA: Diagnosis not present

## 2021-09-29 DIAGNOSIS — M62838 Other muscle spasm: Secondary | ICD-10-CM | POA: Diagnosis not present

## 2021-09-29 DIAGNOSIS — R2689 Other abnormalities of gait and mobility: Secondary | ICD-10-CM | POA: Diagnosis not present

## 2021-09-29 DIAGNOSIS — M6281 Muscle weakness (generalized): Secondary | ICD-10-CM

## 2021-09-29 DIAGNOSIS — G8929 Other chronic pain: Secondary | ICD-10-CM | POA: Diagnosis not present

## 2021-09-29 NOTE — Therapy (Signed)
[facility] ?[place] ?[place], [place], [address] ?Phone: [phone]   Fax:  [phone] ? ?Physical Therapy Treatment ? ?Patient Details  ?Name: Robin Christensen ?MRN: [id number] ?Date of Birth: 05-Apr-1951 ?Referring Provider (PT): Herminio Commons Dumonski ? ? ?Encounter Date: 09/29/2021 ? ? PT End of Session - 09/29/21 0845   ? ? Visit Number 14   ? Number of Visits 24   ? Date for PT Re-Evaluation 11/01/21   ? Authorization Type [employer]   ? Authorization - Visit Number 14   ? Progress Note Due on Visit 20   ? PT Start Time 0845   ? PT Stop Time 0930   ? PT Time Calculation (min) 45 min   ? Activity Tolerance Patient tolerated treatment well   ? Behavior During Therapy [facility] for tasks assessed/performed   ? ?  ?  ? ?  ? ? ?Past Medical History:  ?Diagnosis Date  ? A-fib ([place])   ? B12 deficiency   ? Back pain   ? Breast cancer ([place])   ? Cancer [facility])   ? Right Breast Cancer  ? Chronic headaches   ? Dysrhythmia   ? AF  ? Edema, lower extremity   ? Gallbladder disease   ? GERD (gastroesophageal reflux disease)   ? Hyperlipidemia   ? Hypertension   ? Hypothyroidism   ? Joint pain   ? Obesity   ? Pneumonia   ? PONV (postoperative nausea and vomiting)   ? Sleep apnea   ? cpcap  ? Vitamin deficiency   ? ? ?Past Surgical History:  ?Procedure Laterality Date  ? BREAST SURGERY    ? CARDIOVERSION N/A 06/26/2014  ? Procedure: CARDIOVERSION;  Surgeon: Laverda Page, MD;  Location: [facility];  Service: Cardiovascular;  Laterality: N/A;  ? CATARACT EXTRACTION    ? CHOLECYSTECTOMY    ? DILATION AND CURETTAGE OF UTERUS    ? eyelid surgery    ? TOTAL KNEE ARTHROPLASTY Right 05/03/2016  ? Procedure: TOTAL KNEE ARTHROPLASTY;  Surgeon: Melrose Nakayama, MD;  Location: [facility];  Service: Orthopedics;  Laterality: Right;  ? TOTAL KNEE ARTHROPLASTY Left 08/14/2018  ? Procedure: TOTAL KNEE ARTHROPLASTY;  Surgeon: Melrose Nakayama, MD;  Location: [facility];  Service: Orthopedics;   Laterality: Left;  ? ? ?There were no vitals filed for this visit. ? ? Subjective Assessment - 09/29/21 0850   ? ? Subjective Pt states that she feels a "ring of fire" on the front of her groin/thigh of her L LE. Pt states this exacerbation occurred a few days ago. Not sure what caused it. Pt reports she was given muscle spasm medication and thinks that it helps. Pt feels that the piriformis isn't hurting her but more the low back today. Pt states that she has spent the last few days cleaning and bending to get her home ready for the new mattress.   ? Limitations Sitting;Walking;Standing   ? How long can you sit comfortably? Maybe 20-30 minutes   ? How long can you stand comfortably? Maybe 20-30 minutes   ? How long can you walk comfortably? "Just walking around [employer]"   ? 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 No impingement to explain leg symptoms.   ? Patient Stated Goals decrease pain   ? Currently in Pain? Yes   ? Pain Score 6    ? Pain Location Back   ? Pain Orientation Left   ? Pain  Descriptors / Indicators Sore   ? Pain Type Chronic pain   ? Pain Onset More than a month ago   ? ?  ?  ? ?  ? ? ? ? ? [facility] PT Assessment - 09/29/21 0001   ? ?  ? Assessment  ? Medical Diagnosis R piriformis syndrome   ? Referring Provider (PT) Norva Karvonen   ? ?  ?  ? ?  ? ? ? ? ? ? ? ? ? ? ? ? ? ? ? ? [place] Adult PT Treatment/Exercise - 09/29/21 0001   ? ?  ? Lumbar Exercises: Stretches  ? Passive Hamstring Stretch Right;Left;30 seconds   ? Passive Hamstring Stretch Limitations seated   ? Hip Flexor Stretch Right;Left;3 reps;10 seconds   ? Hip Flexor Stretch Limitations on step   ? Prone on Elbows Stretch 30 seconds   ? Press Ups 10 reps   ? Press Ups Limitations on elbows   ? Other Lumbar Stretch Exercise lateral hip shift 10x3 sec hold each side   ? Other Lumbar Stretch Exercise lumbar extensions 2x10 at counter   ?  ? Lumbar Exercises: Aerobic  ? Nustep L5 x 5 min LEs & UEs   ?  ? Lumbar Exercises:  Standing  ? Other Standing Lumbar Exercises resisted red tband side stepping 3x5' L<>R; backwards/forwards stepping 3x5'   ? Other Standing Lumbar Exercises Mini squat + hip hinge x8 at counter; hip hinge x 10   ?  ? Lumbar Exercises: Prone  ? Straight Leg Raise 15 reps   ? Other Prone Lumbar Exercises hamstring curl x20   ?  ? Manual Therapy  ? Joint Mobilization Gentle PA mobs superior sacrum   ? Soft tissue mobilization STM & TPR lumbar paraspinals and upper glutes bilat   ? ?  ?  ? ?  ? ? ? ? ? ? ? ? ? ? PT Education - 09/29/21 0934   ? ? Education Details Discussed improved bending mechanics (i.e. using hip hinge, legs wide, bend knees). Discussed with pt to focus more on strengthening.   ? Person(s) Educated Patient   ? Methods Explanation;Demonstration;Tactile cues;Verbal cues   ? Comprehension Verbalized understanding;Returned demonstration;Verbal cues required;Tactile cues required   ? ?  ?  ? ?  ? ? ? PT Short Term Goals - 09/20/21 1032   ? ?  ? PT SHORT TERM GOAL #1  ? Title independent with initial HEP   ? Time 3   ? Period Weeks   ? Status Achieved   ? Target Date 08/30/21   ?  ? PT SHORT TERM GOAL #2  ? Title improve hip strength to at least 3+/5   ? Time 3   ? Period Weeks   ? Status Achieved   ? Target Date 08/30/21   ?  ? PT SHORT TERM GOAL #3  ? Title pt will be able to walk for at least 10 minutes for exercise/increased activity   ? Time 3   ? Period Weeks   ? Status Achieved   ? Target Date 08/30/21   ? ?  ?  ? ?  ? ? ? ? PT Long Term Goals - 09/20/21 0910   ? ?  ? PT LONG TERM GOAL #1  ? Title independent with HEP for hip and core strengthening   ? Time 6   ? Period Weeks   ? Status New   ? Target Date 11/01/21   ?  ?  PT LONG TERM GOAL #2  ? Title FOTO score improved to 59   ? Baseline 47   ? Time 6   ? Period Weeks   ? Status New   ? Target Date 11/01/21   ?  ? PT LONG TERM GOAL #3  ? Title Pt will report >50% decrease in pain levels   ? Baseline 6 to 7 at worst; exacerbation last week with  pain up to 10/10 (09/20/21)   ? Time 6   ? Period Weeks   ? Status On-going   ? Target Date 11/01/21   ?  ? PT LONG TERM GOAL #4  ? Title Pt will be able to increase hip strength to at least 4-/5 for improved stability with standing and walking   ? Time 6   ? Period Weeks   ? Status Partially Met   ? Target Date 09/20/21   ?  ? PT LONG TERM GOAL #5  ? Title Pt will report improved sleep by at least 50%   ? Baseline Improved by 50% -- not consistently though   ? Time 6   ? Period Weeks   ? Status Partially Met   ? Target Date 11/01/21   ? ?  ?  ? ?  ? ? ? ? ? ? ? ? Plan - 09/29/21 0935   ? ? Clinical Impression Statement Pt reports decrease piriformis pain and more in her low back radiating to front of left hip and back of right upper glute. Performed Mckenzie extension stretches/techniques with improved pain by end of session. Has demonstrated good understanding of management techniques at home. Session focused primarily on increasing pt's strengthening exercises. Discussed with pt that due to continued weakness as the likely cause of her issues, therapy sessions will focus primarily on strengthening with less manual work/pain management.   ? Personal Factors and Comorbidities Fitness;Age;Time since onset of injury/illness/exacerbation;Past/Current Experience   ? Examination-Activity Limitations Locomotion Level;Transfers;Bed Mobility;Stairs;Squat;Stand;Sit;Sleep   ? Examination-Participation Restrictions Cleaning;Community Activity;Driving;Laundry;Yard Work;Meal Prep;Shop   ? Stability/Clinical Decision Making Evolving/Moderate complexity   ? Rehab Potential Good   ? PT Frequency 2x / week   ? PT Duration 6 weeks   ? PT Treatment/Interventions ADLs/Self Care Home Management;Aquatic Therapy;Cryotherapy;Electrical Stimulation;Iontophoresis 45m/ml Dexamethasone;Moist Heat;Traction;Gait training;Stair training;Functional mobility training;Therapeutic activities;Therapeutic exercise;Balance training;Neuromuscular  re-education;Manual techniques;Patient/family education;Dry needling;Passive range of motion;Taping   ? PT Next Visit Plan Progress core and hip strengthening. Work on strengthening on steps   ? PT Home Exercise P

## 2021-10-04 ENCOUNTER — Ambulatory Visit: Payer: Medicare HMO | Admitting: Physical Therapy

## 2021-10-04 ENCOUNTER — Other Ambulatory Visit: Payer: Self-pay

## 2021-10-04 DIAGNOSIS — M62838 Other muscle spasm: Secondary | ICD-10-CM

## 2021-10-04 DIAGNOSIS — M6281 Muscle weakness (generalized): Secondary | ICD-10-CM | POA: Diagnosis not present

## 2021-10-04 DIAGNOSIS — R2689 Other abnormalities of gait and mobility: Secondary | ICD-10-CM

## 2021-10-04 DIAGNOSIS — G8929 Other chronic pain: Secondary | ICD-10-CM | POA: Diagnosis not present

## 2021-10-04 DIAGNOSIS — M25551 Pain in right hip: Secondary | ICD-10-CM

## 2021-10-04 DIAGNOSIS — M25651 Stiffness of right hip, not elsewhere classified: Secondary | ICD-10-CM | POA: Diagnosis not present

## 2021-10-04 DIAGNOSIS — M5441 Lumbago with sciatica, right side: Secondary | ICD-10-CM | POA: Diagnosis not present

## 2021-10-04 NOTE — Therapy (Signed)
[place] ?[facility] ?[address] ?[place], [place], [address] ?Phone: [phone]   Fax:  [phone] ? ?Physical Therapy Treatment ? ?Patient Details  ?Name: Robin Christensen ?MRN: [id number] ?Date of Birth: 01-15-1951 ?Referring Provider (PT): Herminio Commons Dumonski ? ? ?Encounter Date: 10/04/2021 ? ? PT End of Session - 10/04/21 6962   ? ? Visit Number 15   ? Number of Visits 24   ? Date for PT Re-Evaluation 11/01/21   ? Authorization Type [employer]   ? Authorization - Visit Number 15   ? Progress Note Due on Visit 20   ? PT Start Time [phone]   ? PT Stop Time 0930   ? PT Time Calculation (min) 38 min   ? Activity Tolerance Patient tolerated treatment well   ? Behavior During Therapy [facility] for tasks assessed/performed   ? ?  ?  ? ?  ? ? ?Past Medical History:  ?Diagnosis Date  ? A-fib ([place])   ? B12 deficiency   ? Back pain   ? Breast cancer ([place])   ? Cancer [facility])   ? Right Breast Cancer  ? Chronic headaches   ? Dysrhythmia   ? AF  ? Edema, lower extremity   ? Gallbladder disease   ? GERD (gastroesophageal reflux disease)   ? Hyperlipidemia   ? Hypertension   ? Hypothyroidism   ? Joint pain   ? Obesity   ? Pneumonia   ? PONV (postoperative nausea and vomiting)   ? Sleep apnea   ? cpcap  ? Vitamin deficiency   ? ? ?Past Surgical History:  ?Procedure Laterality Date  ? BREAST SURGERY    ? CARDIOVERSION N/A 06/26/2014  ? Procedure: CARDIOVERSION;  Surgeon: Laverda Page, MD;  Location: [place];  Service: Cardiovascular;  Laterality: N/A;  ? CATARACT EXTRACTION    ? CHOLECYSTECTOMY    ? DILATION AND CURETTAGE OF UTERUS    ? eyelid surgery    ? TOTAL KNEE ARTHROPLASTY Right 05/03/2016  ? Procedure: TOTAL KNEE ARTHROPLASTY;  Surgeon: Melrose Nakayama, MD;  Location: [place];  Service: Orthopedics;  Laterality: Right;  ? TOTAL KNEE ARTHROPLASTY Left 08/14/2018  ? Procedure: TOTAL KNEE ARTHROPLASTY;  Surgeon: Melrose Nakayama, MD;  Location: [place];  Service: Orthopedics;   Laterality: Left;  ? ? ?There were no vitals filed for this visit. ? ? Subjective Assessment - 10/04/21 0854   ? ? Subjective Pt reports she's been doing her extension stretches which has helped. She thinks the new mattress is helping as well. She can still feel it in her L LE.   ? Limitations Sitting;Walking;Standing   ? How long can you sit comfortably? Maybe 20-30 minutes   ? How long can you stand comfortably? Maybe 20-30 minutes   ? How long can you walk comfortably? "Just walking around [employer]"   ? 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 No impingement to explain leg symptoms.   ? Patient Stated Goals decrease pain   ? Currently in Pain? Yes   ? Pain Score 3    ? Pain Location Back   ? Pain Orientation Left   ? Pain Descriptors / Indicators Sore   ? Pain Type Chronic pain   ? Pain Onset More than a month ago   ? ?  ?  ? ?  ? ? ? ? ? [facility] PT Assessment - 10/04/21 0001   ? ?  ? Assessment  ? Medical Diagnosis  R piriformis syndrome   ? Referring Provider (PT) Norva Karvonen   ? ?  ?  ? ?  ? ? ? ? ? ? ? ? ? ? ? ? ? ? ? ? [place] Adult PT Treatment/Exercise - 10/04/21 0001   ? ?  ? Lumbar Exercises: Stretches  ? Passive Hamstring Stretch Right;Left;30 seconds   ? Passive Hamstring Stretch Limitations seated   ? Other Lumbar Stretch Exercise lateral hip shift x30 sec hold each side   ? Other Lumbar Stretch Exercise seated adductor strength x30 sec; lumbar extension x10 at counter   ?  ? Lumbar Exercises: Aerobic  ? Tread Mill 1.6 x 5 min for cool down   ?  ? Lumbar Exercises: Standing  ? Heel Raises 20 reps   ? Shoulder Extension Strengthening;Both;20 reps;Theraband   ? Theraband Level (Shoulder Extension) Level 3 (Green)   ? Shoulder Extension Limitations 3 sec hold   ? Other Standing Lumbar Exercises resisted red tband side stepping 2x10' L<>R, green tband 2x10' L<>R; backwards/forwards stepping green tband 3x10'   ? Other Standing Lumbar Exercises Mini squat + hip hinge 2x5 at counter; hip  hike x10   ? ?  ?  ? ?  ? ? ? ? ? ? ? ? ? ? ? ? PT Short Term Goals - 09/20/21 1032   ? ?  ? PT SHORT TERM GOAL #1  ? Title independent with initial HEP   ? Time 3   ? Period Weeks   ? Status Achieved   ? Target Date 08/30/21   ?  ? PT SHORT TERM GOAL #2  ? Title improve hip strength to at least 3+/5   ? Time 3   ? Period Weeks   ? Status Achieved   ? Target Date 08/30/21   ?  ? PT SHORT TERM GOAL #3  ? Title pt will be able to walk for at least 10 minutes for exercise/increased activity   ? Time 3   ? Period Weeks   ? Status Achieved   ? Target Date 08/30/21   ? ?  ?  ? ?  ? ? ? ? PT Long Term Goals - 09/20/21 0910   ? ?  ? PT LONG TERM GOAL #1  ? Title independent with HEP for hip and core strengthening   ? Time 6   ? Period Weeks   ? Status New   ? Target Date 11/01/21   ?  ? PT LONG TERM GOAL #2  ? Title FOTO score improved to 59   ? Baseline 47   ? Time 6   ? Period Weeks   ? Status New   ? Target Date 11/01/21   ?  ? PT LONG TERM GOAL #3  ? Title Pt will report >50% decrease in pain levels   ? Baseline 6 to 7 at worst; exacerbation last week with pain up to 10/10 (09/20/21)   ? Time 6   ? Period Weeks   ? Status On-going   ? Target Date 11/01/21   ?  ? PT LONG TERM GOAL #4  ? Title Pt will be able to increase hip strength to at least 4-/5 for improved stability with standing and walking   ? Time 6   ? Period Weeks   ? Status Partially Met   ? Target Date 09/20/21   ?  ? PT LONG TERM GOAL #5  ? Title Pt will report improved sleep by  at least 50%   ? Baseline Improved by 50% -- not consistently though   ? Time 6   ? Period Weeks   ? Status Partially Met   ? Target Date 11/01/21   ? ?  ?  ? ?  ? ? ? ? ? ? ? ? Plan - 10/04/21 0914   ? ? Clinical Impression Statement Treatment focused on continued hip stretching. Worked on progressing pt's hip strengthening to green tband in standing. Able to tolerate well.   ? Personal Factors and Comorbidities Fitness;Age;Time since onset of injury/illness/exacerbation;Past/Current  Experience   ? Examination-Activity Limitations Locomotion Level;Transfers;Bed Mobility;Stairs;Squat;Stand;Sit;Sleep   ? Examination-Participation Restrictions Cleaning;Community Activity;Driving;Laundry;Yard Work;Meal Prep;Shop   ? Stability/Clinical Decision Making Evolving/Moderate complexity   ? Rehab Potential Good   ? PT Frequency 2x / week   ? PT Duration 6 weeks   ? PT Treatment/Interventions ADLs/Self Care Home Management;Aquatic Therapy;Cryotherapy;Electrical Stimulation;Iontophoresis 67m/ml Dexamethasone;Moist Heat;Traction;Gait training;Stair training;Functional mobility training;Therapeutic activities;Therapeutic exercise;Balance training;Neuromuscular re-education;Manual techniques;Patient/family education;Dry needling;Passive range of motion;Taping   ? PT Next Visit Plan Progress core and hip strengthening. Work on strengthening on steps   ? PT Home Exercise Plan Access Code: [email or web address]; focus on lateral band walks, backwards walks with resistance, hip hinge + mini squat, McKenzie extension   ? Consulted and Agree with Plan of Care Patient   ? ?  ?  ? ?  ? ? ?Patient will benefit from skilled therapeutic intervention in order to improve the following deficits and impairments:  Abnormal gait, Decreased range of motion, Difficulty walking, Increased fascial restricitons, Increased muscle spasms, Decreased activity tolerance, Pain, Hypomobility, Decreased mobility, Decreased strength, Postural dysfunction ? ?Visit Diagnosis: ?Muscle weakness (generalized) ? ?Chronic right-sided low back pain with right-sided sciatica ? ?Pain in right hip ? ?Stiffness of right hip, not elsewhere classified ? ?Other muscle spasm ? ?Other abnormalities of gait and mobility ? ? ? ? ?Problem List ?Patient Active Problem List  ? Diagnosis Date Noted  ? Other fatigue increased 06/01/2020  ? Atrial fibrillation ([place] 04/23/2020  ? Vitamin D deficiency 04/08/2020  ? Hyperglycemia 03/12/2020  ? Essential hypertension 03/12/2020  ?  Mixed hyperlipidemia 01/14/2020  ? Lower extremity edema 01/14/2020  ? Depression 01/14/2020  ? Class 1 obesity with serious comorbidity and body mass index (BMI) of 34.0 to 34.9 in adult 01/14/2020  ? Pr

## 2021-10-06 ENCOUNTER — Ambulatory Visit: Payer: Medicare HMO | Admitting: Physical Therapy

## 2021-10-06 ENCOUNTER — Other Ambulatory Visit: Payer: Self-pay

## 2021-10-06 DIAGNOSIS — M62838 Other muscle spasm: Secondary | ICD-10-CM

## 2021-10-06 DIAGNOSIS — M25651 Stiffness of right hip, not elsewhere classified: Secondary | ICD-10-CM | POA: Diagnosis not present

## 2021-10-06 DIAGNOSIS — R2689 Other abnormalities of gait and mobility: Secondary | ICD-10-CM | POA: Diagnosis not present

## 2021-10-06 DIAGNOSIS — M25551 Pain in right hip: Secondary | ICD-10-CM

## 2021-10-06 DIAGNOSIS — M5441 Lumbago with sciatica, right side: Secondary | ICD-10-CM | POA: Diagnosis not present

## 2021-10-06 DIAGNOSIS — G8929 Other chronic pain: Secondary | ICD-10-CM | POA: Diagnosis not present

## 2021-10-06 DIAGNOSIS — M6281 Muscle weakness (generalized): Secondary | ICD-10-CM | POA: Diagnosis not present

## 2021-10-06 NOTE — Therapy (Signed)
[place] ?[facility] ?[address] ?[place], [place], [address] ?Phone: [phone]   Fax:  [phone] ? ?Physical Therapy Treatment ? ?Patient Details  ?Name: Robin Christensen ?MRN: [id number] ?Date of Birth: 17-Apr-1951 ?Referring Provider (PT): Herminio Commons Dumonski ? ? ?Encounter Date: 10/06/2021 ? ? PT End of Session - 10/06/21 0849   ? ? Visit Number 16   ? Number of Visits 24   ? Date for PT Re-Evaluation 11/01/21   ? Authorization Type [employer]   ? Authorization - Visit Number 16   ? Progress Note Due on Visit 20   ? PT Start Time [phone]   ? PT Stop Time 0930   ? PT Time Calculation (min) 41 min   ? Activity Tolerance Patient tolerated treatment well   ? Behavior During Therapy [facility] for tasks assessed/performed   ? ?  ?  ? ?  ? ? ?Past Medical History:  ?Diagnosis Date  ? A-fib ([place])   ? B12 deficiency   ? Back pain   ? Breast cancer ([place])   ? Cancer [facility])   ? Right Breast Cancer  ? Chronic headaches   ? Dysrhythmia   ? AF  ? Edema, lower extremity   ? Gallbladder disease   ? GERD (gastroesophageal reflux disease)   ? Hyperlipidemia   ? Hypertension   ? Hypothyroidism   ? Joint pain   ? Obesity   ? Pneumonia   ? PONV (postoperative nausea and vomiting)   ? Sleep apnea   ? cpcap  ? Vitamin deficiency   ? ? ?Past Surgical History:  ?Procedure Laterality Date  ? BREAST SURGERY    ? CARDIOVERSION N/A 06/26/2014  ? Procedure: CARDIOVERSION;  Surgeon: Laverda Page, MD;  Location: [place];  Service: Cardiovascular;  Laterality: N/A;  ? CATARACT EXTRACTION    ? CHOLECYSTECTOMY    ? DILATION AND CURETTAGE OF UTERUS    ? eyelid surgery    ? TOTAL KNEE ARTHROPLASTY Right 05/03/2016  ? Procedure: TOTAL KNEE ARTHROPLASTY;  Surgeon: Melrose Nakayama, MD;  Location: [place];  Service: Orthopedics;  Laterality: Right;  ? TOTAL KNEE ARTHROPLASTY Left 08/14/2018  ? Procedure: TOTAL KNEE ARTHROPLASTY;  Surgeon: Melrose Nakayama, MD;  Location: [place];  Service: Orthopedics;   Laterality: Left;  ? ? ?There were no vitals filed for this visit. ? ? Subjective Assessment - 10/06/21 0850   ? ? Subjective Pt states she feels that the piriformis has tightened up a little bit more while driving in the car. Pt reports waking up feeling it in her back radiating down R leg.   ? Limitations Sitting;Walking;Standing   ? How long can you sit comfortably? Maybe 20-30 minutes   ? How long can you stand comfortably? Maybe 20-30 minutes   ? How long can you walk comfortably? "Just walking around [employer]"   ? 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 No impingement to explain leg symptoms.   ? Patient Stated Goals decrease pain   ? Currently in Pain? Yes   ? Pain Score 3    ? Pain Location Back   ? Pain Orientation Right   ? Pain Descriptors / Indicators Sore   ? Pain Type Chronic pain   ? Pain Onset More than a month ago   ? ?  ?  ? ?  ? ? ? ? ? ? ? ? ? ? ? ? ? ? ? ? ? ? ? ?  [place] Adult PT Treatment/Exercise - 10/06/21 0001   ? ?  ? Lumbar Exercises: Stretches  ? Passive Hamstring Stretch Right;Left;30 seconds   ? Passive Hamstring Stretch Limitations seated   ? Lower Trunk Rotation 10 seconds;5 reps   ? Hip Flexor Stretch Right;Left;30 seconds   ? Hip Flexor Stretch Limitations on supine   ? Press Ups 10 reps   ? Press Ups Limitations on elbows   ? Piriformis Stretch 30 seconds;Right;Left   ? Piriformis Stretch Limitations seated   ? Figure 4 Stretch 30 seconds;Seated   ? Figure 4 Stretch Limitations seated   ? Other Lumbar Stretch Exercise lateral hip shift x30 sec hold each side   ?  ? Lumbar Exercises: Aerobic  ? Nustep L5 x 5 min LEs & UEs   ?  ? Lumbar Exercises: Standing  ? Other Standing Lumbar Exercises Hip hike 2x10   ? Other Standing Lumbar Exercises Mini squat + hip hinge 2x5 at counter; tandem stance 2x30 sec   ?  ? Lumbar Exercises: Supine  ? Pelvic Tilt 10 reps   ? Bent Knee Raise 10 reps   ? Bridge 10 reps   ?  ? Lumbar Exercises: Sidelying  ? Other Sidelying Lumbar  Exercises Lt sidelying:  clam position - knee and toe touches x 10 x 2 sets   ? ?  ?  ? ?  ? ? ? ? ? ? ? ? ? ? ? ? PT Short Term Goals - 09/20/21 1032   ? ?  ? PT SHORT TERM GOAL #1  ? Title independent with initial HEP   ? Time 3   ? Period Weeks   ? Status Achieved   ? Target Date 08/30/21   ?  ? PT SHORT TERM GOAL #2  ? Title improve hip strength to at least 3+/5   ? Time 3   ? Period Weeks   ? Status Achieved   ? Target Date 08/30/21   ?  ? PT SHORT TERM GOAL #3  ? Title pt will be able to walk for at least 10 minutes for exercise/increased activity   ? Time 3   ? Period Weeks   ? Status Achieved   ? Target Date 08/30/21   ? ?  ?  ? ?  ? ? ? ? PT Long Term Goals - 09/20/21 0910   ? ?  ? PT LONG TERM GOAL #1  ? Title independent with HEP for hip and core strengthening   ? Time 6   ? Period Weeks   ? Status New   ? Target Date 11/01/21   ?  ? PT LONG TERM GOAL #2  ? Title FOTO score improved to 59   ? Baseline 47   ? Time 6   ? Period Weeks   ? Status New   ? Target Date 11/01/21   ?  ? PT LONG TERM GOAL #3  ? Title Pt will report >50% decrease in pain levels   ? Baseline 6 to 7 at worst; exacerbation last week with pain up to 10/10 (09/20/21)   ? Time 6   ? Period Weeks   ? Status On-going   ? Target Date 11/01/21   ?  ? PT LONG TERM GOAL #4  ? Title Pt will be able to increase hip strength to at least 4-/5 for improved stability with standing and walking   ? Time 6   ? Period Weeks   ? Status  Partially Met   ? Target Date 09/20/21   ?  ? PT LONG TERM GOAL #5  ? Title Pt will report improved sleep by at least 50%   ? Baseline Improved by 50% -- not consistently though   ? Time 6   ? Period Weeks   ? Status Partially Met   ? Target Date 11/01/21   ? ?  ?  ? ?  ? ? ? ? ? ? ? ? Plan - 10/06/21 0914   ? ? Clinical Impression Statement Worked on progressing pt's strengthening in supine. Now able to tolerate 10 bridges.   ? Personal Factors and Comorbidities Fitness;Age;Time since onset of  injury/illness/exacerbation;Past/Current Experience   ? Examination-Activity Limitations Locomotion Level;Transfers;Bed Mobility;Stairs;Squat;Stand;Sit;Sleep   ? Examination-Participation Restrictions Cleaning;Community Activity;Driving;Laundry;Yard Work;Meal Prep;Shop   ? Stability/Clinical Decision Making Evolving/Moderate complexity   ? Rehab Potential Good   ? PT Frequency 2x / week   ? PT Duration 6 weeks   ? PT Treatment/Interventions ADLs/Self Care Home Management;Aquatic Therapy;Cryotherapy;Electrical Stimulation;Iontophoresis 16m/ml Dexamethasone;Moist Heat;Traction;Gait training;Stair training;Functional mobility training;Therapeutic activities;Therapeutic exercise;Balance training;Neuromuscular re-education;Manual techniques;Patient/family education;Dry needling;Passive range of motion;Taping   ? PT Next Visit Plan Progress core and hip strengthening. Work on strengthening on steps   ? PT Home Exercise Plan Access Code: [email or web address]; focus on lateral band walks, backwards walks with resistance, hip hinge + mini squat, McKenzie extension   ? Consulted and Agree with Plan of Care Patient   ? ?  ?  ? ?  ? ? ?Patient will benefit from skilled therapeutic intervention in order to improve the following deficits and impairments:  Abnormal gait, Decreased range of motion, Difficulty walking, Increased fascial restricitons, Increased muscle spasms, Decreased activity tolerance, Pain, Hypomobility, Decreased mobility, Decreased strength, Postural dysfunction ? ?Visit Diagnosis: ?Muscle weakness (generalized) ? ?Chronic right-sided low back pain with right-sided sciatica ? ?Pain in right hip ? ?Stiffness of right hip, not elsewhere classified ? ?Other muscle spasm ? ?Other abnormalities of gait and mobility ? ? ? ? ?Problem List ?Patient Active Problem List  ? Diagnosis Date Noted  ? Other fatigue increased 06/01/2020  ? Atrial fibrillation ([place] 04/23/2020  ? Vitamin D deficiency 04/08/2020  ? Hyperglycemia 03/12/2020   ? Essential hypertension 03/12/2020  ? Mixed hyperlipidemia 01/14/2020  ? Lower extremity edema 01/14/2020  ? Depression 01/14/2020  ? Class 1 obesity with serious comorbidity and body mass index (BMI) of 34.0 to 34.9 in adult 06/29/202

## 2021-10-11 ENCOUNTER — Ambulatory Visit: Payer: Medicare HMO | Admitting: Physical Therapy

## 2021-10-11 ENCOUNTER — Other Ambulatory Visit: Payer: Self-pay

## 2021-10-11 DIAGNOSIS — M25551 Pain in right hip: Secondary | ICD-10-CM | POA: Diagnosis not present

## 2021-10-11 DIAGNOSIS — G8929 Other chronic pain: Secondary | ICD-10-CM | POA: Diagnosis not present

## 2021-10-11 DIAGNOSIS — M62838 Other muscle spasm: Secondary | ICD-10-CM

## 2021-10-11 DIAGNOSIS — M6281 Muscle weakness (generalized): Secondary | ICD-10-CM | POA: Diagnosis not present

## 2021-10-11 DIAGNOSIS — M5441 Lumbago with sciatica, right side: Secondary | ICD-10-CM | POA: Diagnosis not present

## 2021-10-11 DIAGNOSIS — M25651 Stiffness of right hip, not elsewhere classified: Secondary | ICD-10-CM | POA: Diagnosis not present

## 2021-10-11 DIAGNOSIS — R2689 Other abnormalities of gait and mobility: Secondary | ICD-10-CM

## 2021-10-11 NOTE — Therapy (Signed)
[place] [facility] ?[place] ?[place], [place], [address] ?Phone: [phone]   Fax:  [phone] ? ?Physical Therapy Treatment ? ?Patient Details  ?Name: Robin Christensen ?MRN: [id number] ?Date of Birth: 03/12/1951 ?Referring Provider (PT): Herminio Commons Dumonski ? ? ?Encounter Date: 10/11/2021 ? ? PT End of Session - 10/11/21 0843   ? ? Visit Number 17   ? Number of Visits 24   ? Date for PT Re-Evaluation 11/01/21   ? Authorization Type [employer]   ? Authorization - Visit Number 17   ? Progress Note Due on Visit 20   ? PT Start Time 0845   ? PT Stop Time 0930   ? PT Time Calculation (min) 45 min   ? Activity Tolerance Patient tolerated treatment well   ? Behavior During Therapy [facility] for tasks assessed/performed   ? ?  ?  ? ?  ? ? ?Past Medical History:  ?Diagnosis Date  ? A-fib ([place])   ? B12 deficiency   ? Back pain   ? Breast cancer ([place])   ? Cancer [facility])   ? Right Breast Cancer  ? Chronic headaches   ? Dysrhythmia   ? AF  ? Edema, lower extremity   ? Gallbladder disease   ? GERD (gastroesophageal reflux disease)   ? Hyperlipidemia   ? Hypertension   ? Hypothyroidism   ? Joint pain   ? Obesity   ? Pneumonia   ? PONV (postoperative nausea and vomiting)   ? Sleep apnea   ? cpcap  ? Vitamin deficiency   ? ? ?Past Surgical History:  ?Procedure Laterality Date  ? BREAST SURGERY    ? CARDIOVERSION N/A 06/26/2014  ? Procedure: CARDIOVERSION;  Surgeon: Laverda Page, MD;  Location: [place];  Service: Cardiovascular;  Laterality: N/A;  ? CATARACT EXTRACTION    ? CHOLECYSTECTOMY    ? DILATION AND CURETTAGE OF UTERUS    ? eyelid surgery    ? TOTAL KNEE ARTHROPLASTY Right 05/03/2016  ? Procedure: TOTAL KNEE ARTHROPLASTY;  Surgeon: Melrose Nakayama, MD;  Location: [place];  Service: Orthopedics;  Laterality: Right;  ? TOTAL KNEE ARTHROPLASTY Left 08/14/2018  ? Procedure: TOTAL KNEE ARTHROPLASTY;  Surgeon: Melrose Nakayama, MD;  Location: [place];  Service: Orthopedics;   Laterality: Left;  ? ? ?There were no vitals filed for this visit. ? ? Subjective Assessment - 10/11/21 0846   ? ? Subjective Pt states that she spent time in [place] this weekend. Pt reports that she was sitting in the back of a car for a while.   ? Limitations Sitting;Walking;Standing   ? How long can you sit comfortably? Maybe 20-30 minutes   ? How long can you stand comfortably? Maybe 20-30 minutes   ? How long can you walk comfortably? "Just walking around [employer]"   ? 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 No impingement to explain leg symptoms.   ? Patient Stated Goals decrease pain   ? Currently in Pain? Yes   ? Pain Score 5    ? Pain Location Hip   ? Pain Orientation Posterior;Right   ? Pain Onset More than a month ago   ? ?  ?  ? ?  ? ? ? ? ? [facility] PT Assessment - 10/11/21 0001   ? ?  ? Assessment  ? Medical Diagnosis R piriformis syndrome   ? Referring Provider (PT) Herminio Commons Dumonski   ? ?  ?  ? ?  ? ? ? ? ? ? ? ? ? ? ? ? ? ? ? ?  [place] Adult PT Treatment/Exercise - 10/11/21 0001   ? ?  ? Lumbar Exercises: Stretches  ? Passive Hamstring Stretch Right;Left;30 seconds   ? Passive Hamstring Stretch Limitations seated   ? Press Ups 20 reps   ? Press Ups Limitations on elbows   ? Piriformis Stretch Right;Left;60 seconds   ? Piriformis Stretch Limitations seated   ? Figure 4 Stretch Seated;60 seconds   ? Figure 4 Stretch Limitations seated   ? Other Lumbar Stretch Exercise lateral hip shift x10 each side   ?  ? Lumbar Exercises: Aerobic  ? Nustep L5 x 5 min LEs & UEs   ?  ? Lumbar Exercises: Standing  ? Heel Raises 20 reps   ? Other Standing Lumbar Exercises forward step up x15 focusing on keeping hips level; PPT x10; PPT + yellow medicine ball press forward x10; PPT + diagonals x5 with yellow medicine ball   ? Other Standing Lumbar Exercises Mini squat + hip hinge 2x5 at counter;   ?  ? Lumbar Exercises: Sidelying  ? Hip Abduction 20 reps   ? Other Sidelying Lumbar Exercises Lt sidelying:   clam position - knee and toe touches x 10 x 2 sets   ? Other Sidelying Lumbar Exercises toe tap backward x10   ?  ? Lumbar Exercises: Prone  ? Straight Leg Raise 15 reps   ? ?  ?  ? ?  ? ? ? ? ? ? ? ? ? ? ? ? PT Short Term Goals - 09/20/21 1032   ? ?  ? PT SHORT TERM GOAL #1  ? Title independent with initial HEP   ? Time 3   ? Period Weeks   ? Status Achieved   ? Target Date 08/30/21   ?  ? PT SHORT TERM GOAL #2  ? Title improve hip strength to at least 3+/5   ? Time 3   ? Period Weeks   ? Status Achieved   ? Target Date 08/30/21   ?  ? PT SHORT TERM GOAL #3  ? Title pt will be able to walk for at least 10 minutes for exercise/increased activity   ? Time 3   ? Period Weeks   ? Status Achieved   ? Target Date 08/30/21   ? ?  ?  ? ?  ? ? ? ? PT Long Term Goals - 09/20/21 0910   ? ?  ? PT LONG TERM GOAL #1  ? Title independent with HEP for hip and core strengthening   ? Time 6   ? Period Weeks   ? Status New   ? Target Date 11/01/21   ?  ? PT LONG TERM GOAL #2  ? Title FOTO score improved to 59   ? Baseline 47   ? Time 6   ? Period Weeks   ? Status New   ? Target Date 11/01/21   ?  ? PT LONG TERM GOAL #3  ? Title Pt will report >50% decrease in pain levels   ? Baseline 6 to 7 at worst; exacerbation last week with pain up to 10/10 (09/20/21)   ? Time 6   ? Period Weeks   ? Status On-going   ? Target Date 11/01/21   ?  ? PT LONG TERM GOAL #4  ? Title Pt will be able to increase hip strength to at least 4-/5 for improved stability with standing and walking   ? Time 6   ? Period Weeks   ?  Status Partially Met   ? Target Date 09/20/21   ?  ? PT LONG TERM GOAL #5  ? Title Pt will report improved sleep by at least 50%   ? Baseline Improved by 50% -- not consistently though   ? Time 6   ? Period Weeks   ? Status Partially Met   ? Target Date 11/01/21   ? ?  ?  ? ?  ? ? ? ? ? ? ? ? Plan - 10/11/21 0922   ? ? Clinical Impression Statement Session continues to focus on improving pt's strength and endurance. Worked on standing core  work and improving hip/pelvic stability on steps. Discussed decreasing trendelenburg on steps this session. Pt reports improved symptoms with extensions.   ? Personal Factors and Comorbidities Fitness;Age;Time since onset of injury/illness/exacerbation;Past/Current Experience   ? Examination-Activity Limitations Locomotion Level;Transfers;Bed Mobility;Stairs;Squat;Stand;Sit;Sleep   ? Examination-Participation Restrictions Cleaning;Community Activity;Driving;Laundry;Yard Work;Meal Prep;Shop   ? Stability/Clinical Decision Making Evolving/Moderate complexity   ? Rehab Potential Good   ? PT Frequency 2x / week   ? PT Duration 6 weeks   ? PT Treatment/Interventions ADLs/Self Care Home Management;Aquatic Therapy;Cryotherapy;Electrical Stimulation;Iontophoresis 77m/ml Dexamethasone;Moist Heat;Traction;Gait training;Stair training;Functional mobility training;Therapeutic activities;Therapeutic exercise;Balance training;Neuromuscular re-education;Manual techniques;Patient/family education;Dry needling;Passive range of motion;Taping   ? PT Next Visit Plan Progress core and hip strengthening. Work on strengthening on steps   ? PT Home Exercise Plan Access Code: [email or web address]; focus on lateral band walks, backwards walks with resistance, hip hinge + mini squat, McKenzie extension   ? Consulted and Agree with Plan of Care Patient   ? ?  ?  ? ?  ? ? ?Patient will benefit from skilled therapeutic intervention in order to improve the following deficits and impairments:  Abnormal gait, Decreased range of motion, Difficulty walking, Increased fascial restricitons, Increased muscle spasms, Decreased activity tolerance, Pain, Hypomobility, Decreased mobility, Decreased strength, Postural dysfunction ? ?Visit Diagnosis: ?Muscle weakness (generalized) ? ?Chronic right-sided low back pain with right-sided sciatica ? ?Pain in right hip ? ?Stiffness of right hip, not elsewhere classified ? ?Other muscle spasm ? ?Other abnormalities of gait and  mobility ? ? ? ? ?Problem List ?Patient Active Problem List  ? Diagnosis Date Noted  ? Other fatigue increased 06/01/2020  ? Atrial [place] ([place] 04/23/2020  ? Vitamin D deficiency 04/08/2020  ? Hy

## 2021-10-13 ENCOUNTER — Ambulatory Visit: Payer: Medicare HMO | Admitting: Physical Therapy

## 2021-10-13 ENCOUNTER — Other Ambulatory Visit: Payer: Self-pay

## 2021-10-13 DIAGNOSIS — M25551 Pain in right hip: Secondary | ICD-10-CM | POA: Diagnosis not present

## 2021-10-13 DIAGNOSIS — R2689 Other abnormalities of gait and mobility: Secondary | ICD-10-CM | POA: Diagnosis not present

## 2021-10-13 DIAGNOSIS — M6281 Muscle weakness (generalized): Secondary | ICD-10-CM

## 2021-10-13 DIAGNOSIS — M5441 Lumbago with sciatica, right side: Secondary | ICD-10-CM | POA: Diagnosis not present

## 2021-10-13 DIAGNOSIS — M25651 Stiffness of right hip, not elsewhere classified: Secondary | ICD-10-CM

## 2021-10-13 DIAGNOSIS — M62838 Other muscle spasm: Secondary | ICD-10-CM | POA: Diagnosis not present

## 2021-10-13 DIAGNOSIS — G8929 Other chronic pain: Secondary | ICD-10-CM

## 2021-10-13 NOTE — Therapy (Signed)
[place] ?[facility] ?[place] ?[place], [place], [address] ?Phone: [phone]   Fax:  [phone] ? ?Physical Therapy Treatment ? ?Patient Details  ?Name: Robin Christensen ?MRN: [id number] ?Date of Birth: [birth year] ?Referring Provider (PT): Herminio Commons Dumonski ? ? ?Encounter Date: 10/13/2021 ? ? PT End of Session - 10/13/21 0846   ? ? Visit Number 18   ? Number of Visits 24   ? Date for PT Re-Evaluation 11/01/21   ? Authorization Type [employer]   ? Authorization - Visit Number 18   ? Progress Note Due on Visit 20   ? PT Start Time 0845   ? PT Stop Time 0925   ? PT Time Calculation (min) 40 min   ? Activity Tolerance Patient tolerated treatment well   ? Behavior During Therapy [facility] for tasks assessed/performed   ? ?  ?  ? ?  ? ? ?Past Medical History:  ?Diagnosis Date  ? A-fib ([place])   ? B12 deficiency   ? Back pain   ? Breast cancer ([place])   ? Cancer [facility])   ? Right Breast Cancer  ? Chronic headaches   ? Dysrhythmia   ? AF  ? Edema, lower extremity   ? Gallbladder disease   ? GERD (gastroesophageal reflux disease)   ? Hyperlipidemia   ? Hypertension   ? Hypothyroidism   ? Joint pain   ? Obesity   ? Pneumonia   ? PONV (postoperative nausea and vomiting)   ? Sleep apnea   ? cpcap  ? Vitamin deficiency   ? ? ?Past Surgical History:  ?Procedure Laterality Date  ? BREAST SURGERY    ? CARDIOVERSION N/A 06/26/2014  ? Procedure: CARDIOVERSION;  Surgeon: Laverda Page, MD;  Location: [place];  Service: Cardiovascular;  Laterality: N/A;  ? CATARACT EXTRACTION    ? CHOLECYSTECTOMY    ? DILATION AND CURETTAGE OF UTERUS    ? eyelid surgery    ? TOTAL KNEE ARTHROPLASTY Right 05/03/2016  ? Procedure: TOTAL KNEE ARTHROPLASTY;  Surgeon: Melrose Nakayama, MD;  Location: [place];  Service: Orthopedics;  Laterality: Right;  ? TOTAL KNEE ARTHROPLASTY Left 08/14/2018  ? Procedure: TOTAL KNEE ARTHROPLASTY;  Surgeon: Melrose Nakayama, MD;  Location: [place];  Service: Orthopedics;   Laterality: Left;  ? ? ?There were no vitals filed for this visit. ? ? Subjective Assessment - 10/13/21 0847   ? ? Subjective Pt states she's been in increased pain the last 2 days. Radiating down to her R LE.   ? Limitations Sitting;Walking;Standing   ? How long can you sit comfortably? Maybe 20-30 minutes   ? How long can you stand comfortably? Maybe 20-30 minutes   ? How long can you walk comfortably? "Just walking around [employer]"   ? 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 No impingement to explain leg symptoms.   ? Patient Stated Goals decrease pain   ? Currently in Pain? Yes   ? Pain Score 6    ? Pain Location Hip   ? Pain Onset More than a month ago   ? ?  ?  ? ?  ? ? ? ? ? [facility] PT Assessment - 10/13/21 0001   ? ?  ? Assessment  ? Medical Diagnosis R piriformis syndrome   ? Referring Provider (PT) Herminio Commons Dumonski   ? ?  ?  ? ?  ? ? ? ? ? ? ? ? ? ? ? ? ? ? ? ?  [place] Adult PT Treatment/Exercise - 10/13/21 0001   ? ?  ? Lumbar Exercises: Stretches  ? Lower Trunk Rotation 30 seconds;2 reps   ? Other Lumbar Stretch Exercise sidelying QL stretch x2 min; lateral hip shift x10 on right   ? Other Lumbar Stretch Exercise seated adductor stretch x30 sec; lumbar extension x10 at counter   ?  ? Lumbar Exercises: Aerobic  ? Nustep L5 x 4 min LEs   ?  ? Lumbar Exercises: Standing  ? Heel Raises 20 reps   ? Other Standing Lumbar Exercises green tband 3x10' L<>R; backwards/forwards stepping green tband 3x10'   ? Other Standing Lumbar Exercises Mini squat + hip hinge 2x8 at counter; hip adduction with wash rag 2x10   ?  ? Lumbar Exercises: Seated  ? Other Seated Lumbar Exercises PPT x10; lateral shift to right 3x15 sec hold   ? Other Seated Lumbar Exercises Arms flexed with red medicine ball + hip flexion x10   ?  ? Manual Therapy  ? Joint Mobilization Gentle lumbar rotation mobs in sidelying   ? Soft tissue mobilization STM R QL   ? ?  ?  ? ?  ? ? ? ? ? ? ? ? ? ? ? ? PT Short Term Goals - 09/20/21 1032    ? ?  ? PT SHORT TERM GOAL #1  ? Title independent with initial HEP   ? Time 3   ? Period Weeks   ? Status Achieved   ? Target Date 08/30/21   ?  ? PT SHORT TERM GOAL #2  ? Title improve hip strength to at least 3+/5   ? Time 3   ? Period Weeks   ? Status Achieved   ? Target Date 08/30/21   ?  ? PT SHORT TERM GOAL #3  ? Title pt will be able to walk for at least 10 minutes for exercise/increased activity   ? Time 3   ? Period Weeks   ? Status Achieved   ? Target Date 08/30/21   ? ?  ?  ? ?  ? ? ? ? PT Long Term Goals - 09/20/21 0910   ? ?  ? PT LONG TERM GOAL #1  ? Title independent with HEP for hip and core strengthening   ? Time 6   ? Period Weeks   ? Status New   ? Target Date 11/01/21   ?  ? PT LONG TERM GOAL #2  ? Title FOTO score improved to 59   ? Baseline 47   ? Time 6   ? Period Weeks   ? Status New   ? Target Date 11/01/21   ?  ? PT LONG TERM GOAL #3  ? Title Pt will report >50% decrease in pain levels   ? Baseline 6 to 7 at worst; exacerbation last week with pain up to 10/10 (09/20/21)   ? Time 6   ? Period Weeks   ? Status On-going   ? Target Date 11/01/21   ?  ? PT LONG TERM GOAL #4  ? Title Pt will be able to increase hip strength to at least 4-/5 for improved stability with standing and walking   ? Time 6   ? Period Weeks   ? Status Partially Met   ? Target Date 09/20/21   ?  ? PT LONG TERM GOAL #5  ? Title Pt will report improved sleep by at least 50%   ? Baseline Improved by  50% -- not consistently though   ? Time 6   ? Period Weeks   ? Status Partially Met   ? Target Date 11/01/21   ? ?  ?  ? ?  ? ? ? ? ? ? ? ? Plan - 10/13/21 0911   ? ? Clinical Impression Statement Increased QL tightness this session causing radicular symptoms. Improved after stretching and gentle manual work. Worked on increasing hip/pelvic strength in standing this session.   ? Personal Factors and Comorbidities Fitness;Age;Time since onset of injury/illness/exacerbation;Past/Current Experience   ? Examination-Activity  Limitations Locomotion Level;Transfers;Bed Mobility;Stairs;Squat;Stand;Sit;Sleep   ? Examination-Participation Restrictions Cleaning;Community Activity;Driving;Laundry;Yard Work;Meal Prep;Shop   ? Stability/Clinical Decision Making Evolving/Moderate complexity   ? Rehab Potential Good   ? PT Frequency 2x / week   ? PT Duration 6 weeks   ? PT Treatment/Interventions ADLs/Self Care Home Management;Aquatic Therapy;Cryotherapy;Electrical Stimulation;Iontophoresis 71m/ml Dexamethasone;Moist Heat;Traction;Gait training;Stair training;Functional mobility training;Therapeutic activities;Therapeutic exercise;Balance training;Neuromuscular re-education;Manual techniques;Patient/family education;Dry needling;Passive range of motion;Taping   ? PT Next Visit Plan Progress core and hip strengthening. Work on strengthening on steps   ? PT Home Exercise Plan Access Code: [email or web address]; focus on lateral band walks, backwards walks with resistance, hip hinge + mini squat, McKenzie extension   ? Consulted and Agree with Plan of Care Patient   ? ?  ?  ? ?  ? ? ?Patient will benefit from skilled therapeutic intervention in order to improve the following deficits and impairments:  Abnormal gait, Decreased range of motion, Difficulty walking, Increased fascial restricitons, Increased muscle spasms, Decreased activity tolerance, Pain, Hypomobility, Decreased mobility, Decreased strength, Postural dysfunction ? ?Visit Diagnosis: ?Muscle weakness (generalized) ? ?Chronic right-sided low back pain with right-sided sciatica ? ?Pain in right hip ? ?Stiffness of right hip, not elsewhere classified ? ?Other muscle spasm ? ?Other abnormalities of gait and mobility ? ? ? ? ?Problem List ?Patient Active Problem List  ? Diagnosis Date Noted  ? Other fatigue increased 06/01/2020  ? [place]rial [place] 04/23/2020  ? Vitamin D deficiency 04/08/2020  ? Hyperglycemia 03/12/2020  ? Essential hypertension 03/12/2020  ? Mixed hyperlipidemia 01/14/2020  ?  Lower extremity edema 01/14/2020  ? Depression 01/14/2020  ? Class 1 obesity with serious comorbidity and body mass index (BMI) of 34.0 to 34.9 in adult 01/14/2020  ? Primary osteoarthritis of left knee 08/14/2018

## 2021-10-18 ENCOUNTER — Ambulatory Visit: Payer: Medicare HMO | Admitting: Physical Therapy

## 2021-10-19 DIAGNOSIS — M5416 Radiculopathy, lumbar region: Secondary | ICD-10-CM | POA: Diagnosis not present

## 2021-10-20 ENCOUNTER — Ambulatory Visit: Payer: Medicare HMO | Attending: Orthopedic Surgery | Admitting: Physical Therapy

## 2021-10-20 DIAGNOSIS — M25651 Stiffness of right hip, not elsewhere classified: Secondary | ICD-10-CM | POA: Insufficient documentation

## 2021-10-20 DIAGNOSIS — M6281 Muscle weakness (generalized): Secondary | ICD-10-CM | POA: Diagnosis not present

## 2021-10-20 DIAGNOSIS — M62838 Other muscle spasm: Secondary | ICD-10-CM | POA: Insufficient documentation

## 2021-10-20 DIAGNOSIS — M25551 Pain in right hip: Secondary | ICD-10-CM | POA: Insufficient documentation

## 2021-10-20 DIAGNOSIS — M5441 Lumbago with sciatica, right side: Secondary | ICD-10-CM | POA: Insufficient documentation

## 2021-10-20 DIAGNOSIS — G8929 Other chronic pain: Secondary | ICD-10-CM | POA: Insufficient documentation

## 2021-10-20 DIAGNOSIS — R2689 Other abnormalities of gait and mobility: Secondary | ICD-10-CM | POA: Insufficient documentation

## 2021-10-20 NOTE — Therapy (Signed)
[facility] ?[place], [place], [address] ?Phone: [phone]   Fax:  [phone] ? ?Physical Therapy Treatment ? ?Patient Details  ?Name: Robin Christensen ?MRN: [id number] ?Date of Birth: [birth year] ?Referring Provider (PT): Herminio Commons Dumonski ? ? ?Encounter Date: 10/20/2021 ? ? PT End of Session - 10/20/21 0848   ? ? Visit Number 19   ? Number of Visits 24   ? Date for PT Re-Evaluation 11/01/21   ? Authorization Type [employer]   ? Authorization - Visit Number 19   ? Progress Note Due on Visit 20   ? PT Start Time 0848   ? PT Stop Time 0930   ? PT Time Calculation (min) 42 min   ? Activity Tolerance Patient tolerated treatment well   ? Behavior During Therapy [facility] for tasks assessed/performed   ? ?  ?  ? ?  ? ? ?Past Medical History:  ?Diagnosis Date  ? A-fib ([place])   ? B12 deficiency   ? Back pain   ? Breast cancer ([facility])   ? Cancer [facility])   ? Right Breast Cancer  ? Chronic headaches   ? Dysrhythmia   ? AF  ? Edema, lower extremity   ? Gallbladder disease   ? GERD (gastroesophageal reflux disease)   ? Hyperlipidemia   ? Hypertension   ? Hypothyroidism   ? Joint pain   ? Obesity   ? Pneumonia   ? PONV (postoperative nausea and vomiting)   ? Sleep apnea   ? cpcap  ? Vitamin deficiency   ? ? ?Past Surgical History:  ?Procedure Laterality Date  ? BREAST SURGERY    ? CARDIOVERSION N/A 06/26/2014  ? Procedure: CARDIOVERSION;  Surgeon: Laverda Page, MD;  Location: [facility];  Service: Cardiovascular;  Laterality: N/A;  ? CATARACT EXTRACTION    ? CHOLECYSTECTOMY    ? DILATION AND CURETTAGE OF UTERUS    ? eyelid surgery    ? TOTAL KNEE ARTHROPLASTY Right 05/03/2016  ? Procedure: TOTAL KNEE ARTHROPLASTY;  Surgeon: Melrose Nakayama, MD;  Location: [place] [facility];  Service: Orthopedics;  Laterality: Right;  ? TOTAL KNEE ARTHROPLASTY Left 08/14/2018  ? Procedure: TOTAL KNEE ARTHROPLASTY;  Surgeon: Melrose Nakayama, MD;  Location: [place];  Service: Orthopedics;   Laterality: Left;  ? ? ?There were no vitals filed for this visit. ? ? Subjective Assessment - 10/20/21 0848   ? ? Subjective Pt reports feeling increased pain in her back since Friday. Worsened and flared up throughout the weekend to the point she has had to use her RW. Difficulty moving her LEs. Saw Dr. Jacelyn Grip who believes she needs to see neurosurgeon.   ? Limitations Sitting;Walking;Standing   ? How long can you sit comfortably? Maybe 20-30 minutes   ? How long can you stand comfortably? Maybe 20-30 minutes   ? How long can you walk comfortably? "Just walking around [employer]"   ? 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 No impingement to explain leg symptoms.   ? Patient Stated Goals decrease pain   ? Currently in Pain? Yes   ? Pain Score 10-Worst pain ever   ? Pain Location Back   ? Pain Orientation Posterior;Right   ? Pain Type Chronic pain   ? Pain Onset More than a month ago   ? ?  ?  ? ?  ? ? ? ? ? ? ? ? ? ? ? ? ? ? ? ? ? ? ? ?  [facility] Adult PT Treatment/Exercise - 10/20/21 0001   ? ?  ? Ambulation/Gait  ? Ambulation Distance (Feet) --   throughout gym between exercises  ?  ? Lumbar Exercises: Stretches  ? Press Ups 20 reps   ? Press Ups Limitations on elbows   ? Other Lumbar Stretch Exercise standing extensions against counter 2x10   ?  ? Lumbar Exercises: Standing  ? Row Both;20 reps;Theraband   ? Theraband Level (Row) Level 3 (Green)   ? Row Limitations reactive isometrics   ? Shoulder Extension Strengthening;Both;20 reps;Theraband   3 sec  ? Theraband Level (Shoulder Extension) Level 3 (Green)   ?  ? Lumbar Exercises: Prone  ? Single Arm Raise 20 reps   ? Straight Leg Raise 20 reps   ?  ? Manual Therapy  ? Joint Mobilization gentle grade II lumbar central PAs for extension   ? Soft tissue mobilization STM R glute   ? ?  ?  ? ?  ? ? ? ? ? ? ? ? ? ? ? ? PT Short Term Goals - 09/20/21 1032   ? ?  ? PT SHORT TERM GOAL #1  ? Title independent with initial HEP   ? Time 3   ? Period Weeks   ?  Status Achieved   ? Target Date 08/30/21   ?  ? PT SHORT TERM GOAL #2  ? Title improve hip strength to at least 3+/5   ? Time 3   ? Period Weeks   ? Status Achieved   ? Target Date 08/30/21   ?  ? PT SHORT TERM GOAL #3  ? Title pt will be able to walk for at least 10 minutes for exercise/increased activity   ? Time 3   ? Period Weeks   ? Status Achieved   ? Target Date 08/30/21   ? ?  ?  ? ?  ? ? ? ? PT Long Term Goals - 09/20/21 0910   ? ?  ? PT LONG TERM GOAL #1  ? Title independent with HEP for hip and core strengthening   ? Time 6   ? Period Weeks   ? Status New   ? Target Date 11/01/21   ?  ? PT LONG TERM GOAL #2  ? Title FOTO score improved to 59   ? Baseline 47   ? Time 6   ? Period Weeks   ? Status New   ? Target Date 11/01/21   ?  ? PT LONG TERM GOAL #3  ? Title Pt will report >50% decrease in pain levels   ? Baseline 6 to 7 at worst; exacerbation last week with pain up to 10/10 (09/20/21)   ? Time 6   ? Period Weeks   ? Status On-going   ? Target Date 11/01/21   ?  ? PT LONG TERM GOAL #4  ? Title Pt will be able to increase hip strength to at least 4-/5 for improved stability with standing and walking   ? Time 6   ? Period Weeks   ? Status Partially Met   ? Target Date 09/20/21   ?  ? PT LONG TERM GOAL #5  ? Title Pt will report improved sleep by at least 50%   ? Baseline Improved by 50% -- not consistently though   ? Time 6   ? Period Weeks   ? Status Partially Met   ? Target Date 11/01/21   ? ?  ?  ? ?  ? ? ? ? ? ? ? ?  Plan - 10/20/21 0931   ? ? Clinical Impression Statement Pt's symptoms continue to improve with McKenzie techniques and extensions. No symptoms by end of session. Discussed with pt going into prone at home if she has any issues.   ? Personal Factors and Comorbidities Fitness;Age;Time since onset of injury/illness/exacerbation;Past/Current Experience   ? Examination-Activity Limitations Locomotion Level;Transfers;Bed Mobility;Stairs;Squat;Stand;Sit;Sleep   ? Examination-Participation  Restrictions Cleaning;Community Activity;Driving;Laundry;Yard Work;Meal Prep;Shop   ? Stability/Clinical Decision Making Evolving/Moderate complexity   ? Rehab Potential Good   ? PT Frequency 2x / week   ? PT Duration 6 weeks   ? PT Treatment/Interventions ADLs/Self Care Home Management;Aquatic Therapy;Cryotherapy;Electrical Stimulation;Iontophoresis 89m/ml Dexamethasone;Moist Heat;Traction;Gait training;Stair training;Functional mobility training;Therapeutic activities;Therapeutic exercise;Balance training;Neuromuscular re-education;Manual techniques;Patient/family education;Dry needling;Passive range of motion;Taping   ? PT Next Visit Plan Progress core and hip strengthening. Work on strengthening on steps   ? PT Home Exercise Plan Access Code: [email or web address]; focus on lateral band walks, backwards walks with resistance, hip hinge + mini squat, McKenzie extension   ? Consulted and Agree with Plan of Care Patient   ? ?  ?  ? ?  ? ? ?Patient will benefit from skilled therapeutic intervention in order to improve the following deficits and impairments:  Abnormal gait, Decreased range of motion, Difficulty walking, Increased fascial restricitons, Increased muscle spasms, Decreased activity tolerance, Pain, Hypomobility, Decreased mobility, Decreased strength, Postural dysfunction ? ?Visit Diagnosis: ?Muscle weakness (generalized) ? ?Chronic right-sided low back pain with right-sided sciatica ? ?Pain in right hip ? ?Stiffness of right hip, not elsewhere classified ? ?Other muscle spasm ? ?Other abnormalities of gait and mobility ? ? ? ? ?Problem List ?Patient Active Problem List  ? Diagnosis Date Noted  ? Other fatigue increased 06/01/2020  ? Atrial fibrillation ([facility] 04/23/2020  ? Vitamin D deficiency 04/08/2020  ? Hyperglycemia 03/12/2020  ? Essential hypertension 03/12/2020  ? Mixed hyperlipidemia 01/14/2020  ? Lower extremity edema 01/14/2020  ? Depression 01/14/2020  ? Class 1 obesity with serious comorbidity and body  mass index (BMI) of 34.0 to 34.9 in adult 01/14/2020  ? Primary osteoarthritis of left knee 08/14/2018  ? Primary localized osteoarthritis of right knee 05/03/2016  ? Primary osteoarthritis of right knee 05/03/2016

## 2021-10-25 ENCOUNTER — Ambulatory Visit: Payer: Medicare HMO | Admitting: Physical Therapy

## 2021-10-25 DIAGNOSIS — M5441 Lumbago with sciatica, right side: Secondary | ICD-10-CM | POA: Diagnosis not present

## 2021-10-25 DIAGNOSIS — M25651 Stiffness of right hip, not elsewhere classified: Secondary | ICD-10-CM

## 2021-10-25 DIAGNOSIS — R2689 Other abnormalities of gait and mobility: Secondary | ICD-10-CM

## 2021-10-25 DIAGNOSIS — I700 Atherosclerosis of aorta: Secondary | ICD-10-CM | POA: Diagnosis not present

## 2021-10-25 DIAGNOSIS — M62838 Other muscle spasm: Secondary | ICD-10-CM | POA: Diagnosis not present

## 2021-10-25 DIAGNOSIS — E785 Hyperlipidemia, unspecified: Secondary | ICD-10-CM | POA: Diagnosis not present

## 2021-10-25 DIAGNOSIS — D6869 Other thrombophilia: Secondary | ICD-10-CM | POA: Diagnosis not present

## 2021-10-25 DIAGNOSIS — M25551 Pain in right hip: Secondary | ICD-10-CM

## 2021-10-25 DIAGNOSIS — I4891 Unspecified atrial fibrillation: Secondary | ICD-10-CM | POA: Diagnosis not present

## 2021-10-25 DIAGNOSIS — E039 Hypothyroidism, unspecified: Secondary | ICD-10-CM | POA: Diagnosis not present

## 2021-10-25 DIAGNOSIS — I10 Essential (primary) hypertension: Secondary | ICD-10-CM | POA: Diagnosis not present

## 2021-10-25 DIAGNOSIS — G8929 Other chronic pain: Secondary | ICD-10-CM

## 2021-10-25 DIAGNOSIS — M6281 Muscle weakness (generalized): Secondary | ICD-10-CM | POA: Diagnosis not present

## 2021-10-25 DIAGNOSIS — R739 Hyperglycemia, unspecified: Secondary | ICD-10-CM | POA: Diagnosis not present

## 2021-10-25 DIAGNOSIS — Z6841 Body Mass Index (BMI) 40.0 and over, adult: Secondary | ICD-10-CM | POA: Diagnosis not present

## 2021-10-25 NOTE — Therapy (Addendum)
[facility] [place] [address] [place], [place], [address] Phone: [phone]   Fax:  [phone]  Physical Therapy Treatment and Progress Note  Patient Details  Name: Robin Christensen MRN: [id number] Date of Birth: 1950/11/23 Referring Provider (PT): Jackelyn Hoehn   Progress Note Reporting Period 09/09/21 to 10/25/2021  See note below for Objective Data and Assessment of Progress/Goals.      Encounter Date: 10/25/2021   PT End of Session - 10/25/21 1058     Visit Number 20    Number of Visits 24    Date for PT Re-Evaluation 11/01/21    Authorization Type [employer]    Authorization - Visit Number 20    Progress Note Due on Visit 20    PT Start Time 1058    PT Stop Time 1140    PT Time Calculation (min) 42 min    Activity Tolerance Patient tolerated treatment well    Behavior During Therapy WFL for tasks assessed/performed             Past Medical History:  Diagnosis Date   A-fib ([facility])    B12 deficiency    Back pain    Breast cancer ([facility])    Cancer ([facility])    Right Breast Cancer   Chronic headaches    Dysrhythmia    AF   Edema, lower extremity    Gallbladder disease    GERD (gastroesophageal reflux disease)    Hyperlipidemia    Hypertension    Hypothyroidism    Joint pain    Obesity    Pneumonia    PONV (postoperative nausea and vomiting)    Sleep apnea    cpcap   Vitamin deficiency     Past Surgical History:  Procedure Laterality Date   BREAST SURGERY     CARDIOVERSION N/A 06/26/2014   Procedure: CARDIOVERSION;  Surgeon: Pamella Pert, MD;  Location: [facility];  Service: Cardiovascular;  Laterality: N/A;   CATARACT EXTRACTION     CHOLECYSTECTOMY     DILATION AND CURETTAGE OF UTERUS     eyelid surgery     TOTAL KNEE ARTHROPLASTY Right 05/03/2016   Procedure: TOTAL KNEE ARTHROPLASTY;  Surgeon: Marcene Corning, MD;  Location: [facility];  Service: Orthopedics;  Laterality: Right;    TOTAL KNEE ARTHROPLASTY Left 08/14/2018   Procedure: TOTAL KNEE ARTHROPLASTY;  Surgeon: Marcene Corning, MD;  Location: [facility];  Service: Orthopedics;  Laterality: Left;    There were no vitals filed for this visit.   Subjective Assessment - 10/25/21 1100     Subjective Pt states she has been feeling better since lying prone at home.    Limitations Sitting;Walking;Standing    How long can you sit comfortably? Maybe 20-30 minutes    How long can you stand comfortably? Maybe 20-30 minutes    How long can you walk comfortably? 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 No impingement to explain leg symptoms.    Patient Stated Goals decrease pain    Currently in Pain? Yes    Pain Score 5     Pain Location Back    Pain Orientation Right;Posterior    Pain Descriptors / Indicators Sore    Pain Type Chronic pain    Pain Onset More than a month ago  [facility] Adult PT Treatment/Exercise - 10/25/21 0001       Lumbar Exercises: Stretches   Press Ups 10 reps    Press Ups Limitations on elbows    Piriformis Stretch Right;60 seconds    Figure 4 Stretch Seated;60 seconds    Figure 4 Stretch Limitations seated    Other Lumbar Stretch Exercise standing extensions against counter 2x10      Lumbar Exercises: Standing   Row Both;20 reps;Theraband    Theraband Level (Row) Level 3 (Green)    Row Limitations reactive isometrics    Shoulder Extension Strengthening;Both;20 reps;Theraband    Theraband Level (Shoulder Extension) Level 3 (Green)    Other Standing Lumbar Exercises Mini squat + hip hinge 1x10, 1x8 at counter      Lumbar Exercises: Seated   Other Seated Lumbar Exercises shoulder ER 2x10 green tband      Lumbar Exercises: Supine   Bent Knee Raise 10 reps    Dead Bug 10 reps    Bridge 10 reps    Bridge Limitations beginner bridge    Large Ball Abdominal Isometric 10 reps;5 seconds       Lumbar Exercises: Sidelying   Hip Abduction 20 reps      Lumbar Exercises: Prone   Straight Leg Raise 10 reps    Opposite Arm/Leg Raise Right arm/Left leg;Left arm/Right leg;10 reps                       PT Short Term Goals - 09/20/21 1032       PT SHORT TERM GOAL #1   Title independent with initial HEP    Time 3    Period Weeks    Status Achieved    Target Date 08/30/21      PT SHORT TERM GOAL #2   Title improve hip strength to at least 3+/5    Time 3    Period Weeks    Status Achieved    Target Date 08/30/21      PT SHORT TERM GOAL #3   Title pt will be able to walk for at least 10 minutes for exercise/increased activity    Time 3    Period Weeks    Status Achieved    Target Date 08/30/21               PT Long Term Goals - 09/20/21 0910       PT LONG TERM GOAL #1   Title independent with HEP for hip and core strengthening    Time 6    Period Weeks    Status New    Target Date 11/01/21      PT LONG TERM GOAL #2   Title FOTO score improved to 59    Baseline 47    Time 6    Period Weeks    Status New    Target Date 11/01/21      PT LONG TERM GOAL #3   Title Pt will report >50% decrease in pain levels    Baseline 6 to 7 at worst; exacerbation last week with pain up to 10/10 (09/20/21)    Time 6    Period Weeks    Status On-going    Target Date 11/01/21      PT LONG TERM GOAL #4   Title Pt will be able to increase hip strength to at least 4-/5 for improved stability with standing and walking    Time 6    Period Weeks  Status Partially Met    Target Date 09/20/21      PT LONG TERM GOAL #5   Title Pt will report improved sleep by at least 50%    Baseline Improved by 50% -- not consistently though    Time 6    Period Weeks    Status Partially Met    Target Date 11/01/21                   Plan - 10/25/21 1143     Clinical Impression Statement Continued to work on extensions and progressing core/hip strengthening.  Radicular symptoms continue to respond well with extensions. Pt stands with rounded shoulders and weak thoracic paraspinals -- initiated progressing exercises to continue to improve spine extension and stability.    Personal Factors and Comorbidities Fitness;Age;Time since onset of injury/illness/exacerbation;Past/Current Experience    Examination-Activity Limitations Locomotion Level;Transfers;Bed Mobility;Stairs;Squat;Stand;Sit;Sleep    Examination-Participation Restrictions Cleaning;Community Activity;Driving;Laundry;Yard Work;Meal Prep;Shop    Stability/Clinical Decision Making Evolving/Moderate complexity    Rehab Potential Good    PT Frequency 2x / week    PT Duration 6 weeks    PT Treatment/Interventions ADLs/Self Care Home Management;Aquatic Therapy;Cryotherapy;Electrical Stimulation;Iontophoresis 4mg /ml Dexamethasone;Moist Heat;Traction;Gait training;Stair training;Functional mobility training;Therapeutic activities;Therapeutic exercise;Balance training;Neuromuscular re-education;Manual techniques;Patient/family education;Dry needling;Passive range of motion;Taping    PT Next Visit Plan Progress core and hip strengthening. Work on extensions    PT Home Exercise Plan Access Code: [email or web address]; focus on lateral band walks, backwards walks with resistance, hip hinge + mini squat, McKenzie extension    Consulted and Agree with Plan of Care Patient             Patient will benefit from skilled therapeutic intervention in order to improve the following deficits and impairments:  Abnormal gait, Decreased range of motion, Difficulty walking, Increased fascial restricitons, Increased muscle spasms, Decreased activity tolerance, Pain, Hypomobility, Decreased mobility, Decreased strength, Postural dysfunction  Visit Diagnosis: Muscle weakness (generalized)  Chronic right-sided low back pain with right-sided sciatica  Pain in right hip  Stiffness of right hip, not elsewhere classified  Other  muscle spasm  Other abnormalities of gait and mobility     Problem List Patient Active Problem List   Diagnosis Date Noted   Other fatigue increased 06/01/2020   Atrial fibrillation ([facility]) 04/23/2020   Vitamin D deficiency 04/08/2020   Hyperglycemia 03/12/2020   Essential hypertension 03/12/2020   Mixed hyperlipidemia 01/14/2020   Lower extremity edema 01/14/2020   Depression 01/14/2020   Class 1 obesity with serious comorbidity and body mass index (BMI) of 34.0 to 34.9 in adult 01/14/2020   Primary osteoarthritis of left knee 08/14/2018   Primary localized osteoarthritis of right knee 05/03/2016   Primary osteoarthritis of right knee 05/03/2016   Abnormal auditory perception of both ears 12/01/2015    [facility] April Ma L Kelley, PT, DPT 10/25/2021, 11:45 AM  [facility] [place] [address] [place], [place], [address] Phone: [phone]   Fax:  [phone]  Name: PALOMA CREER MRN: [id number] Date of Birth: [birth year]

## 2021-10-27 ENCOUNTER — Ambulatory Visit: Payer: Medicare HMO | Admitting: Physical Therapy

## 2021-10-27 DIAGNOSIS — M62838 Other muscle spasm: Secondary | ICD-10-CM | POA: Diagnosis not present

## 2021-10-27 DIAGNOSIS — G8929 Other chronic pain: Secondary | ICD-10-CM

## 2021-10-27 DIAGNOSIS — R2689 Other abnormalities of gait and mobility: Secondary | ICD-10-CM | POA: Diagnosis not present

## 2021-10-27 DIAGNOSIS — M6281 Muscle weakness (generalized): Secondary | ICD-10-CM

## 2021-10-27 DIAGNOSIS — M5441 Lumbago with sciatica, right side: Secondary | ICD-10-CM | POA: Diagnosis not present

## 2021-10-27 DIAGNOSIS — I4891 Unspecified atrial fibrillation: Secondary | ICD-10-CM | POA: Diagnosis not present

## 2021-10-27 DIAGNOSIS — E785 Hyperlipidemia, unspecified: Secondary | ICD-10-CM | POA: Diagnosis not present

## 2021-10-27 DIAGNOSIS — M25651 Stiffness of right hip, not elsewhere classified: Secondary | ICD-10-CM | POA: Diagnosis not present

## 2021-10-27 DIAGNOSIS — E039 Hypothyroidism, unspecified: Secondary | ICD-10-CM | POA: Diagnosis not present

## 2021-10-27 DIAGNOSIS — I10 Essential (primary) hypertension: Secondary | ICD-10-CM | POA: Diagnosis not present

## 2021-10-27 DIAGNOSIS — M25551 Pain in right hip: Secondary | ICD-10-CM | POA: Diagnosis not present

## 2021-10-27 NOTE — Therapy (Signed)
[place] [facility] ?[place] ?[place], [place], [address] ?Phone: [phone]   Fax:  [phone] ? ?Physical Therapy Treatment and Recert ? ?Patient Details  ?Name: Robin Christensen ?MRN: [id number] ?Date of Birth: October 26, 1950 ?Referring Provider (PT): Herminio Commons Dumonski ? ? ?Encounter Date: 10/27/2021 ? ? PT End of Session - 10/27/21 0355   ? ? Visit Number 21   ? Number of Visits 24   ? Date for PT Re-Evaluation 11/01/21   ? Authorization Type [employer]   ? Authorization - Visit Number 21   ? Progress Note Due on Visit 20   ? PT Start Time [phone]   ? PT Stop Time 0930   ? PT Time Calculation (min) 38 min   ? Activity Tolerance Patient tolerated treatment well   ? Behavior During Therapy [facility] for tasks assessed/performed   ? ?  ?  ? ?  ? ? ?Past Medical History:  ?Diagnosis Date  ? A-fib ([place])   ? B12 deficiency   ? Back pain   ? Breast cancer ([place])   ? Cancer [facility])   ? Right Breast Cancer  ? Chronic headaches   ? Dysrhythmia   ? AF  ? Edema, lower extremity   ? Gallbladder disease   ? GERD (gastroesophageal reflux disease)   ? Hyperlipidemia   ? Hypertension   ? Hypothyroidism   ? Joint pain   ? Obesity   ? Pneumonia   ? PONV (postoperative nausea and vomiting)   ? Sleep apnea   ? cpcap  ? Vitamin deficiency   ? ? ?Past Surgical History:  ?Procedure Laterality Date  ? BREAST SURGERY    ? CARDIOVERSION N/A 06/26/2014  ? Procedure: CARDIOVERSION;  Surgeon: Laverda Page, MD;  Location: [facility];  Service: Cardiovascular;  Laterality: N/A;  ? CATARACT EXTRACTION    ? CHOLECYSTECTOMY    ? DILATION AND CURETTAGE OF UTERUS    ? eyelid surgery    ? TOTAL KNEE ARTHROPLASTY Right 05/03/2016  ? Procedure: TOTAL KNEE ARTHROPLASTY;  Surgeon: Melrose Nakayama, MD;  Location: [place];  Service: Orthopedics;  Laterality: Right;  ? TOTAL KNEE ARTHROPLASTY Left 08/14/2018  ? Procedure: TOTAL KNEE ARTHROPLASTY;  Surgeon: Melrose Nakayama, MD;  Location: [place];  Service:  Orthopedics;  Laterality: Left;  ? ? ?There were no vitals filed for this visit. ? ? Subjective Assessment - 10/27/21 0856   ? ? Subjective Pt reports waking up yesterday with increased dizziness. Pt states the whole room is moving. Pt states it is better today.   ? Limitations Sitting;Walking;Standing   ? How long can you sit comfortably? Maybe 20-30 minutes   ? How long can you stand comfortably? Maybe 20-30 minutes   ? How long can you walk comfortably? "Just walking around [employer]"   ? 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 No impingement to explain leg symptoms.   ? Patient Stated Goals decrease pain   ? Currently in Pain? Yes   ? Pain Score 5    ? Pain Location Back   ? Pain Orientation Right;Posterior   ? Pain Onset More than a month ago   ? ?  ?  ? ?  ? ? ? ? ? [facility] PT Assessment - 10/27/21 0001   ? ?  ? Observation/Other Assessments  ? Focus on Therapeutic Outcomes (FOTO)  61   ? ?  ?  ? ?  ? ? ? ? ? ?  Vestibular Assessment - 10/27/21 0001   ? ?  ? Symptom Behavior  ? Type of Dizziness  Spinning   ? Frequency of Dizziness All of yesterday   ? Duration of Dizziness All day yesterday, a few seconds   ? Symptom Nature Motion provoked   ? Aggravating Factors Spontaneous onset;Activity in general   ? Relieving Factors Closing eyes;Rest;Slow movements   ? Progression of Symptoms Better   ? History of similar episodes at least 5 other episodes; last episode earlier this week.   ?  ? Positional Testing  ? Sidelying Test Sidelying Right;Sidelying Left   ?  ? Sidelying Right  ? Sidelying Right Duration 5   ? Sidelying Right Symptoms Upbeat, right rotatory nystagmus   "ceiling is moving"  ?  ? Sidelying Left  ? Sidelying Left Duration 0   ? ?  ?  ? ?  ? ? ? ? ? ? ? ? ? ? ? [facility] Adult PT Treatment/Exercise - 10/27/21 0001   ? ?  ? Lumbar Exercises: Standing  ? Row Both;20 reps;Theraband   ? Theraband Level (Row) Level 4 (Blue)   ? Row Limitations reactive isometrics   ? Shoulder Extension  Strengthening;Both;20 reps;Theraband   ? Theraband Level (Shoulder Extension) Level 4 (Blue)   ? Other Standing Lumbar Exercises plank on raised plith 2x15 sec; alternating arm and leg extensions on plinth 2x10   ? Other Standing Lumbar Exercises Mini squat + hip hinge 1x10, 1x8 at counter   ? ?  ?  ? ?  ? ? Vestibular Treatment/Exercise - 10/27/21 0001   ? ?  ? Vestibular Treatment/Exercise  ? Vestibular Treatment Provided Canalith Repositioning   ? Canalith Repositioning Semont Procedure Right Posterior   ?  ? Semont Procedure Right Posterior  ? Number of Reps  2   ? Overall Response  Improved Symptoms   ? ?  ?  ? ?  ? ? ? ? ? ? ? ? ? ? ? PT Short Term Goals - 09/20/21 1032   ? ?  ? PT SHORT TERM GOAL #1  ? Title independent with initial HEP   ? Time 3   ? Period Weeks   ? Status Achieved   ? Target Date 08/30/21   ?  ? PT SHORT TERM GOAL #2  ? Title improve hip strength to at least 3+/5   ? Time 3   ? Period Weeks   ? Status Achieved   ? Target Date 08/30/21   ?  ? PT SHORT TERM GOAL #3  ? Title pt will be able to walk for at least 10 minutes for exercise/increased activity   ? Time 3   ? Period Weeks   ? Status Achieved   ? Target Date 08/30/21   ? ?  ?  ? ?  ? ? ? ? PT Long Term Goals - 10/27/21 0932   ? ?  ? PT LONG TERM GOAL #1  ? Title independent with HEP for hip and core strengthening   ? Time 6   ? Period Weeks   ? Status On-going   ? Target Date 11/01/21   ?  ? PT LONG TERM GOAL #2  ? Title FOTO score improved to 59   ? Baseline 47   ? Time 6   ? Period Weeks   ? Status Achieved   ? Target Date 11/01/21   ?  ? PT LONG TERM GOAL #3  ? Title Pt will  report >50% decrease in pain levels   ? Baseline 6 to 7 at worst; exacerbation last week with pain up to 10/10 (09/20/21)   ? Time 6   ? Period Weeks   ? Status On-going   ? Target Date 11/01/21   ?  ? PT LONG TERM GOAL #4  ? Title Pt will be able to increase hip strength to at least 4-/5 for improved stability with standing and walking   ? Time 6   ? Period  Weeks   ? Status Partially Met   ? Target Date 09/20/21   ?  ? PT LONG TERM GOAL #5  ? Title Pt will report improved sleep by at least 50%   ? Baseline Improved by 50% -- not consistently though   ? Time 6   ? Period Weeks   ? Status Partially Met   ? Target Date 11/01/21   ? ?  ?  ? ?  ? ? ? ? ? ? ? ? Plan - 10/27/21 0900   ? ? Clinical Impression Statement Pt with increased vertigo today. Checked for BPPV -- found pt to have R posterior canalithiasis. Due to back issues provided Semont maneuver over [place]. Improved symptoms after performing. Educated pt accordingly. Continued to work on midback strengthening. Pt would benefit from continued PT for strengthening. Pt reports decrease in pain to 2/10 after treatment session. Pt has met her FOTO goal. She has continued to progress with therapy.   ? Personal Factors and Comorbidities Fitness;Age;Time since onset of injury/illness/exacerbation;Past/Current Experience   ? Examination-Activity Limitations Locomotion Level;Transfers;Bed Mobility;Stairs;Squat;Stand;Sit;Sleep   ? Examination-Participation Restrictions Cleaning;Community Activity;Driving;Laundry;Yard Work;Meal Prep;Shop   ? Rehab Potential Good   ? PT Frequency 2x / week   ? PT Duration 6 weeks   ? PT Treatment/Interventions ADLs/Self Care Home Management;Aquatic Therapy;Cryotherapy;Electrical Stimulation;Iontophoresis 69m/ml Dexamethasone;Moist Heat;Traction;Gait training;Stair training;Functional mobility training;Therapeutic activities;Therapeutic exercise;Balance training;Neuromuscular re-education;Manual techniques;Patient/family education;Dry needling;Passive range of motion;Taping;Vestibular   ? PT Next Visit Plan Progress core and hip strengthening. Work on extensions   ? PT Home Exercise Plan Access Code: [email or web address]; focus on lateral band walks, backwards walks with resistance, hip hinge + mini squat, McKenzie extension   ? Consulted and Agree with Plan of Care Patient   ? ?  ?  ? ?  ? ? ?Patient will  benefit from skilled therapeutic intervention in order to improve the following deficits and impairments:  Abnormal gait, Decreased range of motion, Difficulty walking, Increased fascial restricitons, Increased muscl

## 2021-10-27 NOTE — Addendum Note (Signed)
Addended by: Colbert Ewing MARIE L on: 10/27/2021 10:11 AM ? ? Modules accepted: Orders ? ?

## 2021-11-01 ENCOUNTER — Ambulatory Visit: Payer: Medicare HMO | Admitting: Physical Therapy

## 2021-11-01 DIAGNOSIS — M25551 Pain in right hip: Secondary | ICD-10-CM

## 2021-11-01 DIAGNOSIS — R2689 Other abnormalities of gait and mobility: Secondary | ICD-10-CM | POA: Diagnosis not present

## 2021-11-01 DIAGNOSIS — M6281 Muscle weakness (generalized): Secondary | ICD-10-CM | POA: Diagnosis not present

## 2021-11-01 DIAGNOSIS — G8929 Other chronic pain: Secondary | ICD-10-CM

## 2021-11-01 DIAGNOSIS — M5441 Lumbago with sciatica, right side: Secondary | ICD-10-CM | POA: Diagnosis not present

## 2021-11-01 DIAGNOSIS — M62838 Other muscle spasm: Secondary | ICD-10-CM | POA: Diagnosis not present

## 2021-11-01 DIAGNOSIS — M25651 Stiffness of right hip, not elsewhere classified: Secondary | ICD-10-CM

## 2021-11-01 NOTE — Therapy (Signed)
[place] ?[facility] ?[place] ?[place], [place], [address] ?Phone: [phone]   Fax:  [phone] ? ?Physical Therapy Treatment ? ?Patient Details  ?Name: Robin Christensen ?MRN: [id number] ?Date of Birth: 1951/03/29 ?Referring Provider (PT): Herminio Commons Dumonski ? ? ?Encounter Date: 11/01/2021 ? ? PT End of Session - 11/01/21 0850   ? ? Visit Number 22   ? Number of Visits 30   ? Date for PT Re-Evaluation 11/24/21   ? Authorization Type [employer]   ? Authorization - Visit Number 22   ? Progress Note Due on Visit 20   ? PT Start Time [phone]   ? PT Stop Time 0930   ? PT Time Calculation (min) 39 min   ? Activity Tolerance Patient tolerated treatment well   ? Behavior During Therapy [facility] for tasks assessed/performed   ? ?  ?  ? ?  ? ? ?Past Medical History:  ?Diagnosis Date  ? A-fib ([place])   ? B12 deficiency   ? Back pain   ? Breast cancer ([place])   ? Cancer [facility])   ? Right Breast Cancer  ? Chronic headaches   ? Dysrhythmia   ? AF  ? Edema, lower extremity   ? Gallbladder disease   ? GERD (gastroesophageal reflux disease)   ? Hyperlipidemia   ? Hypertension   ? Hypothyroidism   ? Joint pain   ? Obesity   ? Pneumonia   ? PONV (postoperative nausea and vomiting)   ? Sleep apnea   ? cpcap  ? Vitamin deficiency   ? ? ?Past Surgical History:  ?Procedure Laterality Date  ? BREAST SURGERY    ? CARDIOVERSION N/A 06/26/2014  ? Procedure: CARDIOVERSION;  Surgeon: Laverda Page, MD;  Location: [place];  Service: Cardiovascular;  Laterality: N/A;  ? CATARACT EXTRACTION    ? CHOLECYSTECTOMY    ? DILATION AND CURETTAGE OF UTERUS    ? eyelid surgery    ? TOTAL KNEE ARTHROPLASTY Right 05/03/2016  ? Procedure: TOTAL KNEE ARTHROPLASTY;  Surgeon: Melrose Nakayama, MD;  Location: [facility];  Service: Orthopedics;  Laterality: Right;  ? TOTAL KNEE ARTHROPLASTY Left 08/14/2018  ? Procedure: TOTAL KNEE ARTHROPLASTY;  Surgeon: Melrose Nakayama, MD;  Location: [place];  Service: Orthopedics;   Laterality: Left;  ? ? ?There were no vitals filed for this visit. ? ? Subjective Assessment - 11/01/21 0851   ? ? Subjective Pt states she woke up Thurs and Fri in bad shape but Saturday she felt a lot better.   ? Limitations Sitting;Walking;Standing   ? How long can you sit comfortably? Maybe 20-30 minutes   ? How long can you stand comfortably? Maybe 20-30 minutes   ? How long can you walk comfortably? "Just walking around [employer]"   ? 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 No impingement to explain leg symptoms.   ? Patient Stated Goals decrease pain   ? Currently in Pain? Yes   ? Pain Score 5    ? Pain Location Back   ? Pain Orientation Right;Posterior   ? Pain Descriptors / Indicators Sore   ? Pain Type Chronic pain   ? Pain Onset More than a month ago   ? ?  ?  ? ?  ? ? ? ? ? ? ? ? ? ? ? ? ? ? ? ? ? ? ? ? [place] Adult PT Treatment/Exercise - 11/01/21 0001   ? ?  ?  Lumbar Exercises: Stretches  ? Passive Hamstring Stretch Right;Left;30 seconds   ? Passive Hamstring Stretch Limitations seated   ? Press Ups 10 reps   ? Press Ups Limitations on elbows   ? Piriformis Stretch Right;60 seconds   ? Figure 4 Stretch Seated;60 seconds   ? Figure 4 Stretch Limitations seated   ? Other Lumbar Stretch Exercise standing extensions against counter x10   ? Other Lumbar Stretch Exercise L lateral hip shift into wall 2x30 sec   ?  ? Lumbar Exercises: Standing  ? Row Both;20 reps;Theraband   ? Theraband Level (Row) Level 4 (Blue)   ? Row Limitations reactive isometrics   ? Other Standing Lumbar Exercises shoulder ER red tband 2x10; plank on raised plith 2x20 sec; side plank on wall 2x20 sec; hip ext red tband 2x10   ? Other Standing Lumbar Exercises Mini squat + hip hinge 1x10, 1x5 at counter   ?  ? Lumbar Exercises: Prone  ? Opposite Arm/Leg Raise Right arm/Left leg;Left arm/Right leg;10 reps   ? Other Prone Lumbar Exercises "W" 2x10   ? ?  ?  ? ?  ? ? ? ? ? ? ? ? ? ? ? ? PT Short Term Goals - 09/20/21 1032    ? ?  ? PT SHORT TERM GOAL #1  ? Title independent with initial HEP   ? Time 3   ? Period Weeks   ? Status Achieved   ? Target Date 08/30/21   ?  ? PT SHORT TERM GOAL #2  ? Title improve hip strength to at least 3+/5   ? Time 3   ? Period Weeks   ? Status Achieved   ? Target Date 08/30/21   ?  ? PT SHORT TERM GOAL #3  ? Title pt will be able to walk for at least 10 minutes for exercise/increased activity   ? Time 3   ? Period Weeks   ? Status Achieved   ? Target Date 08/30/21   ? ?  ?  ? ?  ? ? ? ? PT Long Term Goals - 10/27/21 0932   ? ?  ? PT LONG TERM GOAL #1  ? Title independent with HEP for hip and core strengthening   ? Time 4   ? Period Weeks   ? Status On-going   ? Target Date 11/24/21   ?  ? PT LONG TERM GOAL #2  ? Title FOTO score improved to 59   ? Baseline 47   ? Time 6   ? Period Weeks   ? Status Achieved   ? Target Date 11/01/21   ?  ? PT LONG TERM GOAL #3  ? Title Pt will report >50% decrease in pain levels   ? Baseline 6 to 7 at worst; exacerbation last week with pain up to 10/10 (09/20/21)   ? Time 6   ? Period Weeks   ? Status On-going   ? Target Date 11/01/21   ?  ? PT LONG TERM GOAL #4  ? Title Pt will be able to increase hip strength to at least 4-/5 for improved stability with standing and walking   ? Time 4   ? Period Weeks   ? Status Partially Met   ? Target Date 11/24/21   ?  ? PT LONG TERM GOAL #5  ? Title Pt will report improved sleep by at least 50%   ? Baseline Improved by 50% -- not consistently though   ?  Time 4   ? Period Weeks   ? Status Partially Met   ? Target Date 11/24/21   ? ?  ?  ? ?  ? ? ? ? ? ? ? ? Plan - 11/01/21 0907   ? ? Clinical Impression Statement No dizzy symptoms today. Mild R lateral hip pain addressed with manual work and stretching. Continued to strength midback and hip as able.   ? Personal Factors and Comorbidities Fitness;Age;Time since onset of injury/illness/exacerbation;Past/Current Experience   ? Examination-Activity Limitations Locomotion  Level;Transfers;Bed Mobility;Stairs;Squat;Stand;Sit;Sleep   ? Examination-Participation Restrictions Cleaning;Community Activity;Driving;Laundry;Yard Work;Meal Prep;Shop   ? Rehab Potential Good   ? PT Frequency 2x / week   ? PT Duration 4 weeks   ? PT Treatment/Interventions ADLs/Self Care Home Management;Aquatic Therapy;Cryotherapy;Electrical Stimulation;Iontophoresis 29m/ml Dexamethasone;Moist Heat;Traction;Gait training;Stair training;Functional mobility training;Therapeutic activities;Therapeutic exercise;Balance training;Neuromuscular re-education;Manual techniques;Patient/family education;Dry needling;Passive range of motion;Taping;Vestibular   ? PT Next Visit Plan Progress core and hip strengthening. Work on extensions   ? PT Home Exercise Plan Access Code: [email or web address]; focus on lateral band walks, backwards walks with resistance, hip hinge + mini squat, McKenzie extension   ? Consulted and Agree with Plan of Care Patient   ? ?  ?  ? ?  ? ? ?Patient will benefit from skilled therapeutic intervention in order to improve the following deficits and impairments:  Abnormal gait, Decreased range of motion, Difficulty walking, Increased fascial restricitons, Increased muscle spasms, Decreased activity tolerance, Pain, Hypomobility, Decreased mobility, Decreased strength, Postural dysfunction ? ?Visit Diagnosis: ?Muscle weakness (generalized) ? ?Chronic right-sided low back pain with right-sided sciatica ? ?Pain in right hip ? ?Stiffness of right hip, not elsewhere classified ? ?Other muscle spasm ? ?Other abnormalities of gait and mobility ? ? ? ? ?Problem List ?Patient Active Problem List  ? Diagnosis Date Noted  ? Other fatigue increased 06/01/2020  ? Atrial fibrillation ([place] 04/23/2020  ? Vitamin D deficiency 04/08/2020  ? Hyperglycemia 03/12/2020  ? Essential hypertension 03/12/2020  ? Mixed hyperlipidemia 01/14/2020  ? Lower extremity edema 01/14/2020  ? Depression 01/14/2020  ? Class 1 obesity with serious  comorbidity and body mass index (BMI) of 34.0 to 34.9 in adult 01/14/2020  ? Primary osteoarthritis of left knee 08/14/2018  ? Primary localized osteoarthritis of right knee 05/03/2016  ? Primary osteoarthritis of right kne

## 2021-11-03 ENCOUNTER — Ambulatory Visit: Payer: Medicare HMO | Admitting: Physical Therapy

## 2021-11-03 DIAGNOSIS — M25651 Stiffness of right hip, not elsewhere classified: Secondary | ICD-10-CM | POA: Diagnosis not present

## 2021-11-03 DIAGNOSIS — R2689 Other abnormalities of gait and mobility: Secondary | ICD-10-CM

## 2021-11-03 DIAGNOSIS — M62838 Other muscle spasm: Secondary | ICD-10-CM | POA: Diagnosis not present

## 2021-11-03 DIAGNOSIS — M6281 Muscle weakness (generalized): Secondary | ICD-10-CM | POA: Diagnosis not present

## 2021-11-03 DIAGNOSIS — G8929 Other chronic pain: Secondary | ICD-10-CM

## 2021-11-03 DIAGNOSIS — M5441 Lumbago with sciatica, right side: Secondary | ICD-10-CM | POA: Diagnosis not present

## 2021-11-03 DIAGNOSIS — M25551 Pain in right hip: Secondary | ICD-10-CM | POA: Diagnosis not present

## 2021-11-03 NOTE — Therapy (Signed)
[place] ?[facility] ?[place], [place], [address] ?Phone: [phone]   Fax:  [phone] ? ?Physical Therapy Treatment ? ?Patient Details  ?Name: Robin Christensen ?MRN: [id number] ?Date of Birth: May 12, 1951 ?Referring Provider (PT): Herminio Commons Dumonski ? ? ?Encounter Date: 11/03/2021 ? ? PT End of Session - 11/03/21 0853   ? ? Visit Number 23   ? Number of Visits 30   ? Date for PT Re-Evaluation 11/24/21   ? Authorization Type [employer]   ? Authorization - Visit Number 23   ? Progress Note Due on Visit 20   ? PT Start Time [phone]   ? PT Stop Time 0930   ? PT Time Calculation (min) 39 min   ? Activity Tolerance Patient tolerated treatment well   ? Behavior During Therapy [facility] for tasks assessed/performed   ? ?  ?  ? ?  ? ? ?Past Medical History:  ?Diagnosis Date  ? A-fib ([place])   ? B12 deficiency   ? Back pain   ? Breast cancer ([place])   ? Cancer [facility])   ? Right Breast Cancer  ? Chronic headaches   ? Dysrhythmia   ? AF  ? Edema, lower extremity   ? Gallbladder disease   ? GERD (gastroesophageal reflux disease)   ? Hyperlipidemia   ? Hypertension   ? Hypothyroidism   ? Joint pain   ? Obesity   ? Pneumonia   ? PONV (postoperative nausea and vomiting)   ? Sleep apnea   ? cpcap  ? Vitamin deficiency   ? ? ?Past Surgical History:  ?Procedure Laterality Date  ? BREAST SURGERY    ? CARDIOVERSION N/A 06/26/2014  ? Procedure: CARDIOVERSION;  Surgeon: Laverda Page, MD;  Location: [place];  Service: Cardiovascular;  Laterality: N/A;  ? CATARACT EXTRACTION    ? CHOLECYSTECTOMY    ? DILATION AND CURETTAGE OF UTERUS    ? eyelid surgery    ? TOTAL KNEE ARTHROPLASTY Right 05/03/2016  ? Procedure: TOTAL KNEE ARTHROPLASTY;  Surgeon: Melrose Nakayama, MD;  Location: [place];  Service: Orthopedics;  Laterality: Right;  ? TOTAL KNEE ARTHROPLASTY Left 08/14/2018  ? Procedure: TOTAL KNEE ARTHROPLASTY;  Surgeon: Melrose Nakayama, MD;  Location: [place];  Service: Orthopedics;   Laterality: Left;  ? ? ?There were no vitals filed for this visit. ? ? Subjective Assessment - 11/03/21 0854   ? ? Subjective Pt reports she had a fantastic day yesterday. She was able to go up/down stairs to get trash. Pt able to sleep well last night. Notes some increased soreness today.   ? Limitations Sitting;Walking;Standing   ? How long can you sit comfortably? Maybe 20-30 minutes   ? How long can you stand comfortably? Maybe 20-30 minutes   ? How long can you walk comfortably? "Just walking around [employer]"   ? 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 No impingement to explain leg symptoms.   ? Patient Stated Goals decrease pain   ? Currently in Pain? Yes   ? Pain Score 5    ? Pain Location Back   ? Pain Orientation Right;Posterior   ? Pain Descriptors / Indicators Sore   ? Pain Type Chronic pain   ? Pain Onset More than a month ago   ? ?  ?  ? ?  ? ? ? ? ? [facility] PT Assessment - 11/03/21 0001   ? ?  ? Strength  ? Right  Hip Extension 4-/5   ? Right Hip ABduction 4-/5   ? Left Hip Extension 4-/5   ? Left Hip ABduction 4-/5   ?  ? Palpation  ? Palpation comment TTP R lateral glute   ? ?  ?  ? ?  ? ? ? ? ? ? ? ? ? ? ? ? ? ? ? ? [place] Adult PT Treatment/Exercise - 11/03/21 0001   ? ?  ? Lumbar Exercises: Stretches  ? Passive Hamstring Stretch Right;Left;30 seconds   ? Passive Hamstring Stretch Limitations seated   ? Press Ups 10 reps   ? Press Ups Limitations on elbows   ? Piriformis Stretch Right;60 seconds;Left   ? Figure 4 Stretch 60 seconds;Supine   ?  ? Lumbar Exercises: Standing  ? Row Both;20 reps;Theraband   ? Theraband Level (Row) Level 4 (Blue)   ? Row Limitations reactive isometrics   ? Shoulder Extension Strengthening;Both;20 reps;Theraband   ? Theraband Level (Shoulder Extension) Level 4 (Blue)   ? Other Standing Lumbar Exercises shoulder ER green tband 2x10; plank on raised plith 2x30 sec; palloff press blue tband 2x10   ? Other Standing Lumbar Exercises Mini squat + hip hinge 1x10,  1x5 at counter; bow & arrow blue tband x10   ?  ? Lumbar Exercises: Prone  ? Straight Leg Raise 20 reps   ? Other Prone Lumbar Exercises "W" 2x10   ?  ? Manual Therapy  ? Joint Mobilization grade 3 PA mobs on R PSIS   ? Soft tissue mobilization STM & TPR R glute   ? ?  ?  ? ?  ? ? ? ? ? ? ? ? ? ? ? ? PT Short Term Goals - 09/20/21 1032   ? ?  ? PT SHORT TERM GOAL #1  ? Title independent with initial HEP   ? Time 3   ? Period Weeks   ? Status Achieved   ? Target Date 08/30/21   ?  ? PT SHORT TERM GOAL #2  ? Title improve hip strength to at least 3+/5   ? Time 3   ? Period Weeks   ? Status Achieved   ? Target Date 08/30/21   ?  ? PT SHORT TERM GOAL #3  ? Title pt will be able to walk for at least 10 minutes for exercise/increased activity   ? Time 3   ? Period Weeks   ? Status Achieved   ? Target Date 08/30/21   ? ?  ?  ? ?  ? ? ? ? PT Long Term Goals - 11/03/21 0908   ? ?  ? PT LONG TERM GOAL #1  ? Title independent with HEP for hip and core strengthening   ? Time 4   ? Period Weeks   ? Status On-going   ? Target Date 11/24/21   ?  ? PT LONG TERM GOAL #2  ? Title FOTO score improved to 59   ? Baseline 47   ? Time 6   ? Period Weeks   ? Status Achieved   ? Target Date 11/01/21   ?  ? PT LONG TERM GOAL #3  ? Title Pt will report >50% decrease in pain levels   ? Baseline 5 to 6 at worst; exacerbation last week with pain up to 10/10 (11/03/21)   ? Time 6   ? Period Weeks   ? Status On-going   ? Target Date 11/01/21   ?  ?  PT LONG TERM GOAL #4  ? Title Pt will be able to increase hip strength to at least 4-/5 for improved stability with standing and walking   ? Time 4   ? Period Weeks   ? Status Achieved   ? Target Date 11/24/21   ?  ? PT LONG TERM GOAL #5  ? Title Pt will report improved sleep by at least 50%   ? Baseline Improved by 50% to 75% on 11/03/21   ? Time 4   ? Period Weeks   ? Status Achieved   ? Target Date 11/24/21   ? ?  ?  ? ?  ? ? ? ? ? ? ? ? Plan - 11/03/21 0935   ? ? Clinical Impression Statement  Continued trigger points along lateral glutes. Continued manual work and stretching. Pt with improving piriformis tightness as she reports she no longer feels she is sitting on a boulder. R LE radicular symptoms have been controlled with McKenzie extensions. Dizziness has subsided. Pt is slowly meeting her LTGs and continues to demonstrate good progress. Pain levels fluctuate in intensity but in general has decreased. She would benefit from continued PT services to meet her max potential.   ? Personal Factors and Comorbidities Fitness;Age;Time since onset of injury/illness/exacerbation;Past/Current Experience   ? Examination-Activity Limitations Locomotion Level;Transfers;Bed Mobility;Stairs;Squat;Stand;Sit;Sleep   ? Examination-Participation Restrictions Cleaning;Community Activity;Driving;Laundry;Yard Work;Meal Prep;Shop   ? Rehab Potential Good   ? PT Frequency 2x / week   ? PT Duration 4 weeks   ? PT Treatment/Interventions ADLs/Self Care Home Management;Aquatic Therapy;Cryotherapy;Electrical Stimulation;Iontophoresis '4mg'$ /ml Dexamethasone;Moist Heat;Traction;Gait training;Stair training;Functional mobility training;Therapeutic activities;Therapeutic exercise;Balance training;Neuromuscular re-education;Manual techniques;Patient/family education;Dry needling;Passive range of motion;Taping;Vestibular   ? PT Next Visit Plan Progress core and hip strengthening. Work on extensions   ? PT Home Exercise Plan Access Code: [email or web address]; focus on lateral band walks, backwards walks with resistance, hip hinge + mini squat, McKenzie extension   ? Consulted and Agree with Plan of Care Patient   ? ?  ?  ? ?  ? ? ?Patient will benefit from skilled therapeutic intervention in order to improve the following deficits and impairments:  Abnormal gait, Decreased range of motion, Difficulty walking, Increased fascial restricitons, Increased muscle spasms, Decreased activity tolerance, Pain, Hypomobility, Decreased mobility, Decreased  strength, Postural dysfunction ? ?Visit Diagnosis: ?Muscle weakness (generalized) ? ?Chronic right-sided low back pain with right-sided sciatica ? ?Pain in right hip ? ?Stiffness of right hip, not elsewhere classi

## 2021-11-09 ENCOUNTER — Ambulatory Visit: Payer: Medicare HMO | Admitting: Physical Therapy

## 2021-11-12 ENCOUNTER — Ambulatory Visit: Payer: Medicare HMO | Admitting: Physical Therapy

## 2021-11-12 DIAGNOSIS — G8929 Other chronic pain: Secondary | ICD-10-CM | POA: Diagnosis not present

## 2021-11-12 DIAGNOSIS — M62838 Other muscle spasm: Secondary | ICD-10-CM

## 2021-11-12 DIAGNOSIS — M6281 Muscle weakness (generalized): Secondary | ICD-10-CM

## 2021-11-12 DIAGNOSIS — M5441 Lumbago with sciatica, right side: Secondary | ICD-10-CM | POA: Diagnosis not present

## 2021-11-12 DIAGNOSIS — M25551 Pain in right hip: Secondary | ICD-10-CM | POA: Diagnosis not present

## 2021-11-12 DIAGNOSIS — R2689 Other abnormalities of gait and mobility: Secondary | ICD-10-CM

## 2021-11-12 DIAGNOSIS — M25651 Stiffness of right hip, not elsewhere classified: Secondary | ICD-10-CM

## 2021-11-12 NOTE — Therapy (Signed)
[place] ?[facility] ?[address] ?[place], [place], [address] ?Phone: [phone]   Fax:  [phone] ? ?Physical Therapy Treatment ? ?Patient Details  ?Name: Robin Christensen ?MRN: [id number] ?Date of Birth: 10/26/1950 ?Referring Provider (PT): Herminio Commons Dumonski ? ? ?Encounter Date: 11/12/2021 ? ? PT End of Session - 11/12/21 1056   ? ? Visit Number 24   ? Number of Visits 30   ? Date for PT Re-Evaluation 11/24/21   ? Authorization Type [employer]   ? Authorization - Visit Number 24   ? Progress Note Due on Visit 20   ? PT Start Time 1056   ? PT Stop Time 1140   ? PT Time Calculation (min) 44 min   ? Activity Tolerance Patient tolerated treatment well   ? Behavior During Therapy [facility] for tasks assessed/performed   ? ?  ?  ? ?  ? ? ?Past Medical History:  ?Diagnosis Date  ? A-fib ([place])   ? B12 deficiency   ? Back pain   ? Breast cancer ([place])   ? Cancer [facility])   ? Right Breast Cancer  ? Chronic headaches   ? Dysrhythmia   ? AF  ? Edema, lower extremity   ? Gallbladder disease   ? GERD (gastroesophageal reflux disease)   ? Hyperlipidemia   ? Hypertension   ? Hypothyroidism   ? Joint pain   ? Obesity   ? Pneumonia   ? PONV (postoperative nausea and vomiting)   ? Sleep apnea   ? cpcap  ? Vitamin deficiency   ? ? ?Past Surgical History:  ?Procedure Laterality Date  ? BREAST SURGERY    ? CARDIOVERSION N/A 06/26/2014  ? Procedure: CARDIOVERSION;  Surgeon: Laverda Page, MD;  Location: [place];  Service: Cardiovascular;  Laterality: N/A;  ? CATARACT EXTRACTION    ? CHOLECYSTECTOMY    ? DILATION AND CURETTAGE OF UTERUS    ? eyelid surgery    ? TOTAL KNEE ARTHROPLASTY Right 05/03/2016  ? Procedure: TOTAL KNEE ARTHROPLASTY;  Surgeon: Melrose Nakayama, MD;  Location: [place];  Service: Orthopedics;  Laterality: Right;  ? TOTAL KNEE ARTHROPLASTY Left 08/14/2018  ? Procedure: TOTAL KNEE ARTHROPLASTY;  Surgeon: Melrose Nakayama, MD;  Location: [facility];  Service: Orthopedics;   Laterality: Left;  ? ? ?There were no vitals filed for this visit. ? ? Subjective Assessment - 11/12/21 1059   ? ? Subjective Pt states she was feeling too sick to come last visit. Pt reports she missed her appointment for f/u on her spine with Dr. Ronnald Ramp.   ? Limitations Sitting;Walking;Standing   ? How long can you sit comfortably? Maybe 20-30 minutes   ? How long can you stand comfortably? Maybe 20-30 minutes   ? How long can you walk comfortably? "Just walking around [employer]"   ? 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 No impingement to explain leg symptoms.   ? Patient Stated Goals decrease pain   ? Pain Onset More than a month ago   ? ?  ?  ? ?  ? ? ? ? ? [facility] PT Assessment - 11/12/21 0001   ? ?  ? Assessment  ? Medical Diagnosis R piriformis syndrome   ? Referring Provider (PT) Norva Karvonen   ? ?  ?  ? ?  ? ? ? ? ? ? ? ? ? ? ? ? ? ? ? ? [place] [facility] Adult PT Treatment/Exercise - 11/12/21 0001   ? ?  ?  Lumbar Exercises: Stretches  ? Passive Hamstring Stretch Right;Left;30 seconds   ? Passive Hamstring Stretch Limitations seated   ? Press Ups 10 reps   ? Piriformis Stretch Right;60 seconds;Left   ? Figure 4 Stretch 60 seconds;Seated   ?  ? Lumbar Exercises: Standing  ? Row --   ? Theraband Level (Row) --   ? Row Limitations --   ? Shoulder Extension --   ? Theraband Level (Shoulder Extension) --   ? Other Standing Lumbar Exercises shoulder ER green tband 2x10, shoulder horizontal abd green tband 2x10; low trap setting 2x10; plank on raised plith 2x30 sec; modified quadruped on plinth + hip abd and ext 2x10   ? Other Standing Lumbar Exercises Mini squat + hip hinge 2x10 at counter; one foot on step 2x30 sec holds; step ups 2x5   ?  ? Lumbar Exercises: Prone  ? Straight Leg Raise 20 reps   ? Straight Leg Raises Limitations red tband   ? Other Prone Lumbar Exercises "I", "W" 2x10   ?  ? Manual Therapy  ? Joint Mobilization grade 3 PA mobs on R PSIS   ? Soft tissue mobilization STM & TPR R lateral  glute   ? ?  ?  ? ?  ? ? ? ? ? ? ? ? ? ? ? ? PT Short Term Goals - 09/20/21 1032   ? ?  ? PT SHORT TERM GOAL #1  ? Title independent with initial HEP   ? Time 3   ? Period Weeks   ? Status Achieved   ? Target Date 08/30/21   ?  ? PT SHORT TERM GOAL #2  ? Title improve hip strength to at least 3+/5   ? Time 3   ? Period Weeks   ? Status Achieved   ? Target Date 08/30/21   ?  ? PT SHORT TERM GOAL #3  ? Title pt will be able to walk for at least 10 minutes for exercise/increased activity   ? Time 3   ? Period Weeks   ? Status Achieved   ? Target Date 08/30/21   ? ?  ?  ? ?  ? ? ? ? PT Long Term Goals - 11/12/21 1140   ? ?  ? PT LONG TERM GOAL #1  ? Title independent with HEP for hip and core strengthening   ? Time 4   ? Period Weeks   ? Status On-going   ? Target Date 11/24/21   ?  ? PT LONG TERM GOAL #2  ? Title FOTO score improved to 59   ? Baseline 47   ? Time 6   ? Period Weeks   ? Status Achieved   ? Target Date 11/01/21   ?  ? PT LONG TERM GOAL #3  ? Title Pt will report >50% decrease in pain levels   ? Baseline 5 to 6 at worst; exacerbation last week with pain up to 10/10 (11/03/21)   ? Time 6   ? Period Weeks   ? Status On-going   ? Target Date 11/24/21   ?  ? PT LONG TERM GOAL #4  ? Title Pt will be able to increase hip strength to at least 4-/5 for improved stability with standing and walking   ? Time 4   ? Period Weeks   ? Status Achieved   ? Target Date 11/24/21   ?  ? PT LONG TERM GOAL #5  ? Title Pt will  report improved sleep by at least 50%   ? Baseline Improved by 50% to 75% on 11/03/21   ? Time 4   ? Period Weeks   ? Status Achieved   ? Target Date 11/24/21   ? ?  ?  ? ?  ? ? ? ? ? ? ? ? Plan - 11/12/21 1136   ? ? Clinical Impression Statement Treatment focused on continued glute and core strengthening. Able to perform 4" step with less trendelenburg pattern. Able to tolerate prone hip extensions with more resistance. Continued R lateral glute tightness.   ? Personal Factors and Comorbidities  Fitness;Age;Time since onset of injury/illness/exacerbation;Past/Current Experience   ? Examination-Activity Limitations Locomotion Level;Transfers;Bed Mobility;Stairs;Squat;Stand;Sit;Sleep   ? Examination-Participation Restrictions Cleaning;Community Activity;Driving;Laundry;Yard Work;Meal Prep;Shop   ? Rehab Potential Good   ? PT Frequency 2x / week   ? PT Duration 4 weeks   ? PT Treatment/Interventions ADLs/Self Care Home Management;Aquatic Therapy;Cryotherapy;Electrical Stimulation;Iontophoresis '4mg'$ /ml Dexamethasone;Moist Heat;Traction;Gait training;Stair training;Functional mobility training;Therapeutic activities;Therapeutic exercise;Balance training;Neuromuscular re-education;Manual techniques;Patient/family education;Dry needling;Passive range of motion;Taping;Vestibular   ? PT Next Visit Plan Progress core and hip strengthening. Work on extensions   ? PT Home Exercise Plan Access Code: [email or web address]; focus on lateral band walks, backwards walks with resistance, hip hinge + mini squat, McKenzie extension   ? Consulted and Agree with Plan of Care Patient   ? ?  ?  ? ?  ? ? ?Patient will benefit from skilled therapeutic intervention in order to improve the following deficits and impairments:  Abnormal gait, Decreased range of motion, Difficulty walking, Increased fascial restricitons, Increased muscle spasms, Decreased activity tolerance, Pain, Hypomobility, Decreased mobility, Decreased strength, Postural dysfunction ? ?Visit Diagnosis: ?Muscle weakness (generalized) ? ?Chronic right-sided low back pain with right-sided sciatica ? ?Pain in right hip ? ?Stiffness of right hip, not elsewhere classified ? ?Other muscle spasm ? ?Other abnormalities of gait and mobility ? ? ? ? ?Problem List ?Patient Active Problem List  ? Diagnosis Date Noted  ? Other fatigue increased 06/01/2020  ? Atrial fibrillation ([place]) 04/23/2020  ? Vitamin D deficiency 04/08/2020  ? Hyperglycemia 03/12/2020  ? Essential hypertension 03/12/2020   ? Mixed hyperlipidemia 01/14/2020  ? Lower extremity edema 01/14/2020  ? Depression 01/14/2020  ? Class 1 obesity with serious comorbidity and body mass index (BMI) of 34.0 to 34.9 in adult 01/14/2020  ? Primary o

## 2021-11-16 ENCOUNTER — Ambulatory Visit: Payer: Medicare HMO | Attending: Orthopedic Surgery | Admitting: Physical Therapy

## 2021-11-16 DIAGNOSIS — G8929 Other chronic pain: Secondary | ICD-10-CM | POA: Diagnosis not present

## 2021-11-16 DIAGNOSIS — M62838 Other muscle spasm: Secondary | ICD-10-CM | POA: Diagnosis not present

## 2021-11-16 DIAGNOSIS — M6281 Muscle weakness (generalized): Secondary | ICD-10-CM | POA: Diagnosis not present

## 2021-11-16 DIAGNOSIS — M25651 Stiffness of right hip, not elsewhere classified: Secondary | ICD-10-CM | POA: Diagnosis not present

## 2021-11-16 DIAGNOSIS — M25551 Pain in right hip: Secondary | ICD-10-CM | POA: Diagnosis not present

## 2021-11-16 DIAGNOSIS — M5441 Lumbago with sciatica, right side: Secondary | ICD-10-CM | POA: Diagnosis not present

## 2021-11-16 DIAGNOSIS — R2689 Other abnormalities of gait and mobility: Secondary | ICD-10-CM | POA: Insufficient documentation

## 2021-11-16 NOTE — Therapy (Signed)
[place] ?[facility] ?[address] ?[place], [place], [address] ?Phone: [phone]   Fax:  [phone] ? ?Physical Therapy Treatment ? ?Patient Details  ?Name: Robin Christensen ?MRN: [id number] ?Date of Birth: 07-11-1951 ?Referring Provider (PT): Herminio Commons Dumonski ? ? ?Encounter Date: 11/16/2021 ? ? PT End of Session - 11/16/21 0844   ? ? Visit Number 25   ? Number of Visits 30   ? Date for PT Re-Evaluation 11/24/21   ? Authorization Type [employer]   ? Authorization - Visit Number 25   ? Progress Note Due on Visit 20   ? PT Start Time 0845   ? PT Stop Time 0925   ? PT Time Calculation (min) 40 min   ? Activity Tolerance Patient tolerated treatment well   ? Behavior During Therapy [facility] for tasks assessed/performed   ? ?  ?  ? ?  ? ? ?Past Medical History:  ?Diagnosis Date  ? A-fib ([place])   ? B12 deficiency   ? Back pain   ? Breast cancer ([place])   ? Cancer [facility])   ? Right Breast Cancer  ? Chronic headaches   ? Dysrhythmia   ? AF  ? Edema, lower extremity   ? Gallbladder disease   ? GERD (gastroesophageal reflux disease)   ? Hyperlipidemia   ? Hypertension   ? Hypothyroidism   ? Joint pain   ? Obesity   ? Pneumonia   ? PONV (postoperative nausea and vomiting)   ? Sleep apnea   ? cpcap  ? Vitamin deficiency   ? ? ?Past Surgical History:  ?Procedure Laterality Date  ? BREAST SURGERY    ? CARDIOVERSION N/A 06/26/2014  ? Procedure: CARDIOVERSION;  Surgeon: Laverda Page, MD;  Location: [place];  Service: Cardiovascular;  Laterality: N/A;  ? CATARACT EXTRACTION    ? CHOLECYSTECTOMY    ? DILATION AND CURETTAGE OF UTERUS    ? eyelid surgery    ? TOTAL KNEE ARTHROPLASTY Right 05/03/2016  ? Procedure: TOTAL KNEE ARTHROPLASTY;  Surgeon: Melrose Nakayama, MD;  Location: [place];  Service: Orthopedics;  Laterality: Right;  ? TOTAL KNEE ARTHROPLASTY Left 08/14/2018  ? Procedure: TOTAL KNEE ARTHROPLASTY;  Surgeon: Melrose Nakayama, MD;  Location: [place];  Service: Orthopedics;   Laterality: Left;  ? ? ?There were no vitals filed for this visit. ? ? Subjective Assessment - 11/16/21 0847   ? ? Subjective Pt reports her weekend was okay.   ? Limitations Sitting;Walking;Standing   ? How long can you sit comfortably? Maybe 20-30 minutes   ? How long can you stand comfortably? Maybe 20-30 minutes   ? How long can you walk comfortably? "Just walking around [employer]"   ? 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 No impingement to explain leg symptoms.   ? Patient Stated Goals decrease pain   ? Pain Onset More than a month ago   ? ?  ?  ? ?  ? ? ? ? ? [facility] PT Assessment - 11/16/21 0001   ? ?  ? Assessment  ? Medical Diagnosis R piriformis syndrome   ? Referring Provider (PT) Norva Karvonen   ? ?  ?  ? ?  ? ? ? ? ? ? ? ? ? ? ? ? ? ? ? ? [place] Adult PT Treatment/Exercise - 11/16/21 0001   ? ?  ? Lumbar Exercises: Stretches  ? Passive Hamstring Stretch Right;Left;30 seconds   ?  Passive Hamstring Stretch Limitations seated   ? Press Ups 20 reps   ? Press Ups Limitations on elbows   ? Piriformis Stretch Right;60 seconds;Left   ? Figure 4 Stretch 60 seconds;Seated   ?  ? Lumbar Exercises: Standing  ? Other Standing Lumbar Exercises shoulder ER green tband 2x10, shoulder horizontal abd green tband 2x10; low trap setting 2x10; plank on raised plith 2x30 sec; modified quadruped on plinth + hip abd and ext 2x10; modified plank with alternating UE and LE raising 2x10   ? Other Standing Lumbar Exercises Mini squat + hip hinge 3x5 at counter; step ups 3x5   ?  ? Manual Therapy  ? Joint Mobilization grade 2 PA mobs on R PSIS; PA mobs on sacrum   ? Soft tissue mobilization STM & TPR R&L lateral glute   ? ?  ?  ? ?  ? ? ? ? ? ? ? ? ? ? ? ? PT Short Term Goals - 09/20/21 1032   ? ?  ? PT SHORT TERM GOAL #1  ? Title independent with initial HEP   ? Time 3   ? Period Weeks   ? Status Achieved   ? Target Date 08/30/21   ?  ? PT SHORT TERM GOAL #2  ? Title improve hip strength to at least 3+/5   ?  Time 3   ? Period Weeks   ? Status Achieved   ? Target Date 08/30/21   ?  ? PT SHORT TERM GOAL #3  ? Title pt will be able to walk for at least 10 minutes for exercise/increased activity   ? Time 3   ? Period Weeks   ? Status Achieved   ? Target Date 08/30/21   ? ?  ?  ? ?  ? ? ? ? PT Long Term Goals - 11/12/21 1140   ? ?  ? PT LONG TERM GOAL #1  ? Title independent with HEP for hip and core strengthening   ? Time 4   ? Period Weeks   ? Status On-going   ? Target Date 11/24/21   ?  ? PT LONG TERM GOAL #2  ? Title FOTO score improved to 59   ? Baseline 47   ? Time 6   ? Period Weeks   ? Status Achieved   ? Target Date 11/01/21   ?  ? PT LONG TERM GOAL #3  ? Title Pt will report >50% decrease in pain levels   ? Baseline 5 to 6 at worst; exacerbation last week with pain up to 10/10 (11/03/21)   ? Time 6   ? Period Weeks   ? Status On-going   ? Target Date 11/24/21   ?  ? PT LONG TERM GOAL #4  ? Title Pt will be able to increase hip strength to at least 4-/5 for improved stability with standing and walking   ? Time 4   ? Period Weeks   ? Status Achieved   ? Target Date 11/24/21   ?  ? PT LONG TERM GOAL #5  ? Title Pt will report improved sleep by at least 50%   ? Baseline Improved by 50% to 75% on 11/03/21   ? Time 4   ? Period Weeks   ? Status Achieved   ? Target Date 11/24/21   ? ?  ?  ? ?  ? ? ? ? ? ? ? ? Plan - 11/16/21 0910   ? ?  Clinical Impression Statement Continued to work on core, hip and back strengthening. Working on improving mid back strengthening.   ? Personal Factors and Comorbidities Fitness;Age;Time since onset of injury/illness/exacerbation;Past/Current Experience   ? Examination-Activity Limitations Locomotion Level;Transfers;Bed Mobility;Stairs;Squat;Stand;Sit;Sleep   ? Examination-Participation Restrictions Cleaning;Community Activity;Driving;Laundry;Yard Work;Meal Prep;Shop   ? Rehab Potential Good   ? PT Frequency 2x / week   ? PT Duration 4 weeks   ? PT Treatment/Interventions ADLs/Self Care Home  Management;Aquatic Therapy;Cryotherapy;Electrical Stimulation;Iontophoresis '4mg'$ /ml Dexamethasone;Moist Heat;Traction;Gait training;Stair training;Functional mobility training;Therapeutic activities;Therapeutic exercise;Balance training;Neuromuscular re-education;Manual techniques;Patient/family education;Dry needling;Passive range of motion;Taping;Vestibular   ? PT Next Visit Plan Progress core, back, and hip strengthening. Work on extensions   ? PT Home Exercise Plan Access Code: [email or web address]; focus on lateral band walks, backwards walks with resistance, hip hinge + mini squat, McKenzie extension   ? Consulted and Agree with Plan of Care Patient   ? ?  ?  ? ?  ? ? ?Patient will benefit from skilled therapeutic intervention in order to improve the following deficits and impairments:  Abnormal gait, Decreased range of motion, Difficulty walking, Increased fascial restricitons, Increased muscle spasms, Decreased activity tolerance, Pain, Hypomobility, Decreased mobility, Decreased strength, Postural dysfunction ? ?Visit Diagnosis: ?Muscle weakness (generalized) ? ?Chronic right-sided low back pain with right-sided sciatica ? ?Pain in right hip ? ?Stiffness of right hip, not elsewhere classified ? ?Other muscle spasm ? ?Other abnormalities of gait and mobility ? ? ? ? ?Problem List ?Patient Active Problem List  ? Diagnosis Date Noted  ? Other fatigue increased 06/01/2020  ? Atrial fibrillation ([place]) 04/23/2020  ? Vitamin D deficiency 04/08/2020  ? Hyperglycemia 03/12/2020  ? Essential hypertension 03/12/2020  ? Mixed hyperlipidemia 01/14/2020  ? Lower extremity edema 01/14/2020  ? Depression 01/14/2020  ? Class 1 obesity with serious comorbidity and body mass index (BMI) of 34.0 to 34.9 in adult 01/14/2020  ? Primary osteoarthritis of left knee 08/14/2018  ? Primary localized osteoarthritis of right knee 05/03/2016  ? Primary osteoarthritis of right knee 05/03/2016  ? Abnormal auditory perception of both ears 12/01/2015   ? ? ?Adelfo Diebel April Gordy Levan, PT, DPT ?11/16/2021, 9:24 AM ? ?[place] ?[facility] ?[address] ?[place], [place], [address] ?Phone: [phone]   Fax:  [phone]

## 2021-11-18 ENCOUNTER — Ambulatory Visit: Payer: Medicare HMO | Admitting: Physical Therapy

## 2021-11-18 DIAGNOSIS — G8929 Other chronic pain: Secondary | ICD-10-CM | POA: Diagnosis not present

## 2021-11-18 DIAGNOSIS — M25651 Stiffness of right hip, not elsewhere classified: Secondary | ICD-10-CM

## 2021-11-18 DIAGNOSIS — M62838 Other muscle spasm: Secondary | ICD-10-CM

## 2021-11-18 DIAGNOSIS — M25551 Pain in right hip: Secondary | ICD-10-CM | POA: Diagnosis not present

## 2021-11-18 DIAGNOSIS — R2689 Other abnormalities of gait and mobility: Secondary | ICD-10-CM

## 2021-11-18 DIAGNOSIS — M6281 Muscle weakness (generalized): Secondary | ICD-10-CM | POA: Diagnosis not present

## 2021-11-18 DIAGNOSIS — M5441 Lumbago with sciatica, right side: Secondary | ICD-10-CM | POA: Diagnosis not present

## 2021-11-18 NOTE — Therapy (Signed)
[place] ?[facility] ?[address] ?[place], [place], [address] ?Phone: [phone]   Fax:  [phone] ? ?Physical Therapy Treatment ? ?Patient Details  ?Name: Robin Christensen ?MRN: [id number] ?Date of Birth: 13-Mar-1951 ?Referring Provider (PT): Herminio Commons Dumonski ? ? ?Encounter Date: 11/18/2021 ? ? PT End of Session - 11/18/21 0845   ? ? Visit Number 26   ? Number of Visits 30   ? Date for PT Re-Evaluation 11/24/21   ? Authorization Type [employer]   ? Authorization - Visit Number 26   ? Progress Note Due on Visit 20   ? PT Start Time 0845   ? PT Stop Time 0925   ? PT Time Calculation (min) 40 min   ? Activity Tolerance Patient tolerated treatment well   ? Behavior During Therapy [facility] for tasks assessed/performed   ? ?  ?  ? ?  ? ? ?Past Medical History:  ?Diagnosis Date  ? A-fib ([place])   ? B12 deficiency   ? Back pain   ? Breast cancer ([place])   ? Cancer [facility])   ? Right Breast Cancer  ? Chronic headaches   ? Dysrhythmia   ? AF  ? Edema, lower extremity   ? Gallbladder disease   ? GERD (gastroesophageal reflux disease)   ? Hyperlipidemia   ? Hypertension   ? Hypothyroidism   ? Joint pain   ? Obesity   ? Pneumonia   ? PONV (postoperative nausea and vomiting)   ? Sleep apnea   ? cpcap  ? Vitamin deficiency   ? ? ?Past Surgical History:  ?Procedure Laterality Date  ? BREAST SURGERY    ? CARDIOVERSION N/A 06/26/2014  ? Procedure: CARDIOVERSION;  Surgeon: Laverda Page, MD;  Location: [place];  Service: Cardiovascular;  Laterality: N/A;  ? CATARACT EXTRACTION    ? CHOLECYSTECTOMY    ? DILATION AND CURETTAGE OF UTERUS    ? eyelid surgery    ? TOTAL KNEE ARTHROPLASTY Right 05/03/2016  ? Procedure: TOTAL KNEE ARTHROPLASTY;  Surgeon: Melrose Nakayama, MD;  Location: [place];  Service: Orthopedics;  Laterality: Right;  ? TOTAL KNEE ARTHROPLASTY Left 08/14/2018  ? Procedure: TOTAL KNEE ARTHROPLASTY;  Surgeon: Melrose Nakayama, MD;  Location: [place];  Service: Orthopedics;   Laterality: Left;  ? ? ?There were no vitals filed for this visit. ? ? ? ? ? ? [facility] PT Assessment - 11/18/21 0001   ? ?  ? Assessment  ? Medical Diagnosis R piriformis syndrome   ? Referring Provider (PT) Norva Karvonen   ? ?  ?  ? ?  ? ? ? ? ? ? ? ? ? ? ? ? ? ? ? ? [place] Adult PT Treatment/Exercise - 11/18/21 0001   ? ?  ? Lumbar Exercises: Stretches  ? Passive Hamstring Stretch Right;Left;30 seconds   ? Passive Hamstring Stretch Limitations seated   ? Piriformis Stretch Right;60 seconds;Left   ? Figure 4 Stretch 60 seconds;Seated   ?  ? Lumbar Exercises: Standing  ? Other Standing Lumbar Exercises shoulder ER green tband 2x10, shoulder horizontal abd green tband 2x10; "W" green tband x15; bow and arrow green tband x10   ? Other Standing Lumbar Exercises Mini squat + hip hinge 3x5 at counter; step ups 3x5; plank on raised plith 2x30 sec; modified quadruped on plinth + hip ext x10;   ? ?  ?  ? ?  ? ? ? ? ? ? ? ? ? ? ? ?  PT Short Term Goals - 09/20/21 1032   ? ?  ? PT SHORT TERM GOAL #1  ? Title independent with initial HEP   ? Time 3   ? Period Weeks   ? Status Achieved   ? Target Date 08/30/21   ?  ? PT SHORT TERM GOAL #2  ? Title improve hip strength to at least 3+/5   ? Time 3   ? Period Weeks   ? Status Achieved   ? Target Date 08/30/21   ?  ? PT SHORT TERM GOAL #3  ? Title pt will be able to walk for at least 10 minutes for exercise/increased activity   ? Time 3   ? Period Weeks   ? Status Achieved   ? Target Date 08/30/21   ? ?  ?  ? ?  ? ? ? ? PT Long Term Goals - 11/12/21 1140   ? ?  ? PT LONG TERM GOAL #1  ? Title independent with HEP for hip and core strengthening   ? Time 4   ? Period Weeks   ? Status On-going   ? Target Date 11/24/21   ?  ? PT LONG TERM GOAL #2  ? Title FOTO score improved to 59   ? Baseline 47   ? Time 6   ? Period Weeks   ? Status Achieved   ? Target Date 11/01/21   ?  ? PT LONG TERM GOAL #3  ? Title Pt will report >50% decrease in pain levels   ? Baseline 5 to 6 at worst; exacerbation  last week with pain up to 10/10 (11/03/21)   ? Time 6   ? Period Weeks   ? Status On-going   ? Target Date 11/24/21   ?  ? PT LONG TERM GOAL #4  ? Title Pt will be able to increase hip strength to at least 4-/5 for improved stability with standing and walking   ? Time 4   ? Period Weeks   ? Status Achieved   ? Target Date 11/24/21   ?  ? PT LONG TERM GOAL #5  ? Title Pt will report improved sleep by at least 50%   ? Baseline Improved by 50% to 75% on 11/03/21   ? Time 4   ? Period Weeks   ? Status Achieved   ? Target Date 11/24/21   ? ?  ?  ? ?  ? ? ? ? ? ? ? ? Plan - 11/18/21 0858   ? ? Clinical Impression Statement Continued to work on hip strengthening functionally on steps. Pt tolerating lower plinth for core strengthening during modified plank.   ? Personal Factors and Comorbidities Fitness;Age;Time since onset of injury/illness/exacerbation;Past/Current Experience   ? Examination-Activity Limitations Locomotion Level;Transfers;Bed Mobility;Stairs;Squat;Stand;Sit;Sleep   ? Examination-Participation Restrictions Cleaning;Community Activity;Driving;Laundry;Yard Work;Meal Prep;Shop   ? Rehab Potential Good   ? PT Frequency 2x / week   ? PT Duration 4 weeks   ? PT Treatment/Interventions ADLs/Self Care Home Management;Aquatic Therapy;Cryotherapy;Electrical Stimulation;Iontophoresis '4mg'$ /ml Dexamethasone;Moist Heat;Traction;Gait training;Stair training;Functional mobility training;Therapeutic activities;Therapeutic exercise;Balance training;Neuromuscular re-education;Manual techniques;Patient/family education;Dry needling;Passive range of motion;Taping;Vestibular   ? PT Next Visit Plan Progress core, back, and hip strengthening. Work on extensions   ? PT Home Exercise Plan Access Code: [email or web address]; focus on lateral band walks, backwards walks with resistance, hip hinge + mini squat, McKenzie extension   ? Consulted and Agree with Plan of Care Patient   ? ?  ?  ? ?  ? ? ?  Patient will benefit from skilled therapeutic  intervention in order to improve the following deficits and impairments:  Abnormal gait, Decreased range of motion, Difficulty walking, Increased fascial restricitons, Increased muscle spasms, Decreased activity tolerance, Pain, Hypomobility, Decreased mobility, Decreased strength, Postural dysfunction ? ?Visit Diagnosis: ?Muscle weakness (generalized) ? ?Chronic right-sided low back pain with right-sided sciatica ? ?Pain in right hip ? ?Stiffness of right hip, not elsewhere classified ? ?Other muscle spasm ? ?Other abnormalities of gait and mobility ? ? ? ? ?Problem List ?Patient Active Problem List  ? Diagnosis Date Noted  ? Other fatigue increased 06/01/2020  ? Atrial fibrillation ([place]) 04/23/2020  ? Vitamin D deficiency 04/08/2020  ? Hyperglycemia 03/12/2020  ? Essential hypertension 03/12/2020  ? Mixed hyperlipidemia 01/14/2020  ? Lower extremity edema 01/14/2020  ? Depression 01/14/2020  ? Class 1 obesity with serious comorbidity and body mass index (BMI) of 34.0 to 34.9 in adult 01/14/2020  ? Primary osteoarthritis of left knee 08/14/2018  ? Primary localized osteoarthritis of right knee 05/03/2016  ? Primary osteoarthritis of right knee 05/03/2016  ? Abnormal auditory perception of both ears 12/01/2015  ? ? ?Cybele Maule April Gordy Levan, PT, DPT ?11/18/2021, 9:26 AM ? ?[place] [facility] ?[place], [place], [address] ?Phone: [phone]   Fax:  [phone] ? ?Name: Robin Christensen ?MRN: [id number] ?Date of Birth: 02-Apr-1951 ? ? ? ?

## 2021-11-22 ENCOUNTER — Ambulatory Visit: Payer: Medicare HMO | Admitting: Physical Therapy

## 2021-11-22 DIAGNOSIS — R2689 Other abnormalities of gait and mobility: Secondary | ICD-10-CM

## 2021-11-22 DIAGNOSIS — M62838 Other muscle spasm: Secondary | ICD-10-CM

## 2021-11-22 DIAGNOSIS — G8929 Other chronic pain: Secondary | ICD-10-CM

## 2021-11-22 DIAGNOSIS — M25551 Pain in right hip: Secondary | ICD-10-CM

## 2021-11-22 DIAGNOSIS — M6281 Muscle weakness (generalized): Secondary | ICD-10-CM

## 2021-11-22 DIAGNOSIS — M25651 Stiffness of right hip, not elsewhere classified: Secondary | ICD-10-CM | POA: Diagnosis not present

## 2021-11-22 DIAGNOSIS — M5441 Lumbago with sciatica, right side: Secondary | ICD-10-CM | POA: Diagnosis not present

## 2021-11-22 NOTE — Therapy (Signed)
[place] ?[facility] ?[address] ?[place], [place], [address] ?Phone: [phone]   Fax:  [phone] ? ?Physical Therapy Treatment ? ?Patient Details  ?Name: Robin Christensen ?MRN: [id number] ?Date of Birth: [birth year] ?Referring Provider (PT): Herminio Commons Dumonski ? ? ?Encounter Date: 11/22/2021 ? ? PT End of Session - 11/22/21 0844   ? ? Visit Number 27   ? Number of Visits 30   ? Date for PT Re-Evaluation 11/24/21   ? Authorization Type [employer]   ? Authorization - Visit Number 27   ? Progress Note Due on Visit 20   ? PT Start Time [phone]   ? PT Stop Time 0925   ? PT Time Calculation (min) 41 min   ? Activity Tolerance Patient tolerated treatment well   ? Behavior During Therapy [facility] for tasks assessed/performed   ? ?  ?  ? ?  ? ? ?Past Medical History:  ?Diagnosis Date  ? A-fib ([place])   ? B12 deficiency   ? Back pain   ? Breast cancer ([place])   ? Cancer [facility])   ? Right Breast Cancer  ? Chronic headaches   ? Dysrhythmia   ? AF  ? Edema, lower extremity   ? Gallbladder disease   ? GERD (gastroesophageal reflux disease)   ? Hyperlipidemia   ? Hypertension   ? Hypothyroidism   ? Joint pain   ? Obesity   ? Pneumonia   ? PONV (postoperative nausea and vomiting)   ? Sleep apnea   ? cpcap  ? Vitamin deficiency   ? ? ?Past Surgical History:  ?Procedure Laterality Date  ? BREAST SURGERY    ? CARDIOVERSION N/A 06/26/2014  ? Procedure: CARDIOVERSION;  Surgeon: Laverda Page, MD;  Location: [place];  Service: Cardiovascular;  Laterality: N/A;  ? CATARACT EXTRACTION    ? CHOLECYSTECTOMY    ? DILATION AND CURETTAGE OF UTERUS    ? eyelid surgery    ? TOTAL KNEE ARTHROPLASTY Right 05/03/2016  ? Procedure: TOTAL KNEE ARTHROPLASTY;  Surgeon: Melrose Nakayama, MD;  Location: [place];  Service: Orthopedics;  Laterality: Right;  ? TOTAL KNEE ARTHROPLASTY Left 08/14/2018  ? Procedure: TOTAL KNEE ARTHROPLASTY;  Surgeon: Melrose Nakayama, MD;  Location: [place];  Service: Orthopedics;   Laterality: Left;  ? ? ?There were no vitals filed for this visit. ? ? Subjective Assessment - 11/22/21 0852   ? ? Subjective Pt states she has had it rough since last Friday. Pt is to see MD 11/30/21.   ? Limitations Sitting;Walking;Standing   ? How long can you sit comfortably? Maybe 20-30 minutes   ? How long can you stand comfortably? Maybe 20-30 minutes   ? How long can you walk comfortably? "Just walking around [employer]"   ? 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 No impingement to explain leg symptoms.   ? Patient Stated Goals decrease pain   ? Currently in Pain? Yes   ? Pain Score 8    ? Pain Location Back   ? Pain Type Chronic pain   ? Pain Onset More than a month ago   ? ?  ?  ? ?  ? ? ? ? ? [facility] PT Assessment - 11/22/21 0001   ? ?  ? Assessment  ? Medical Diagnosis R piriformis syndrome   ? Referring Provider (PT) Herminio Commons Dumonski   ? ?  ?  ? ?  ? ? ? ? ? ? ? ? ? ? ? ? ? ? ? ?  [place] Adult PT Treatment/Exercise - 11/22/21 0001   ? ?  ? Lumbar Exercises: Stretches  ? Hip Flexor Stretch Right;Left;30 seconds;2 reps   ? Hip Flexor Stretch Limitations prone   ? Press Ups 20 reps   ? Press Ups Limitations on elbows   ? Quad Stretch Right;Left;2 reps;30 seconds   ? Other Lumbar Stretch Exercise standing extensions against counter x10; doorway pec stretch x30 sec   ? Other Lumbar Stretch Exercise lateral hip shift into wall 2x10   ?  ? Lumbar Exercises: Standing  ? Other Standing Lumbar Exercises shoulder ER blue tband 2x10, "W" blue tband x10; bow and arrow green tband 2x10   ?  ? Lumbar Exercises: Prone  ? Straight Leg Raise 20 reps   ? Other Prone Lumbar Exercises "I", "W" 2x10   ? Other Prone Lumbar Exercises hamstring curl 2x10   ?  ? Manual Therapy  ? Joint Mobilization grade 2 PA mobs on R PSIS; PA mobs on sacrum; grade 2 Lumbosacral rotation in L sidelying   ? Soft tissue mobilization STM & TPR R&L lateral glute   ? ?  ?  ? ?  ? ? ? ? ? ? ? ? ? ? ? ? PT Short Term Goals - 09/20/21 1032    ? ?  ? PT SHORT TERM GOAL #1  ? Title independent with initial HEP   ? Time 3   ? Period Weeks   ? Status Achieved   ? Target Date 08/30/21   ?  ? PT SHORT TERM GOAL #2  ? Title improve hip strength to at least 3+/5   ? Time 3   ? Period Weeks   ? Status Achieved   ? Target Date 08/30/21   ?  ? PT SHORT TERM GOAL #3  ? Title pt will be able to walk for at least 10 minutes for exercise/increased activity   ? Time 3   ? Period Weeks   ? Status Achieved   ? Target Date 08/30/21   ? ?  ?  ? ?  ? ? ? ? PT Long Term Goals - 11/12/21 1140   ? ?  ? PT LONG TERM GOAL #1  ? Title independent with HEP for hip and core strengthening   ? Time 4   ? Period Weeks   ? Status On-going   ? Target Date 11/24/21   ?  ? PT LONG TERM GOAL #2  ? Title FOTO score improved to 59   ? Baseline 47   ? Time 6   ? Period Weeks   ? Status Achieved   ? Target Date 11/01/21   ?  ? PT LONG TERM GOAL #3  ? Title Pt will report >50% decrease in pain levels   ? Baseline 5 to 6 at worst; exacerbation last week with pain up to 10/10 (11/03/21)   ? Time 6   ? Period Weeks   ? Status On-going   ? Target Date 11/24/21   ?  ? PT LONG TERM GOAL #4  ? Title Pt will be able to increase hip strength to at least 4-/5 for improved stability with standing and walking   ? Time 4   ? Period Weeks   ? Status Achieved   ? Target Date 11/24/21   ?  ? PT LONG TERM GOAL #5  ? Title Pt will report improved sleep by at least 50%   ? Baseline Improved by 50% to 75%  on 11/03/21   ? Time 4   ? Period Weeks   ? Status Achieved   ? Target Date 11/24/21   ? ?  ?  ? ?  ? ? ? ? ? ? ? ? Plan - 11/22/21 0906   ? ? Clinical Impression Statement Exacerbation of nerve burning down her leg this session. Improved after prone and LE extensions. Rest of session focused on pt to maintain upright posture and standing exercises -- symptoms worsened with sitting.   ? Personal Factors and Comorbidities Fitness;Age;Time since onset of injury/illness/exacerbation;Past/Current Experience   ?  Examination-Activity Limitations Locomotion Level;Transfers;Bed Mobility;Stairs;Squat;Stand;Sit;Sleep   ? Examination-Participation Restrictions Cleaning;Community Activity;Driving;Laundry;Yard Work;Meal Prep;Shop   ? Rehab Potential Good   ? PT Frequency 2x / week   ? PT Duration 4 weeks   ? PT Treatment/Interventions ADLs/Self Care Home Management;Aquatic Therapy;Cryotherapy;Electrical Stimulation;Iontophoresis '4mg'$ /ml Dexamethasone;Moist Heat;Traction;Gait training;Stair training;Functional mobility training;Therapeutic activities;Therapeutic exercise;Balance training;Neuromuscular re-education;Manual techniques;Patient/family education;Dry needling;Passive range of motion;Taping;Vestibular   ? PT Next Visit Plan Progress core, back, and hip strengthening. Work on extensions   ? PT Home Exercise Plan Access Code: [email or web address]; focus on lateral band walks, backwards walks with resistance, hip hinge + mini squat, McKenzie extension   ? Consulted and Agree with Plan of Care Patient   ? ?  ?  ? ?  ? ? ?Patient will benefit from skilled therapeutic intervention in order to improve the following deficits and impairments:  Abnormal gait, Decreased range of motion, Difficulty walking, Increased fascial restricitons, Increased muscle spasms, Decreased activity tolerance, Pain, Hypomobility, Decreased mobility, Decreased strength, Postural dysfunction ? ?Visit Diagnosis: ?Muscle weakness (generalized) ? ?Chronic right-sided low back pain with right-sided sciatica ? ?Pain in right hip ? ?Stiffness of right hip, not elsewhere classified ? ?Other muscle spasm ? ?Other abnormalities of gait and mobility ? ? ? ? ?Problem List ?Patient Active Problem List  ? Diagnosis Date Noted  ? Other fatigue increased 06/01/2020  ? Atrial fibrillation ([place]) 04/23/2020  ? Vitamin D deficiency 04/08/2020  ? Hyperglycemia 03/12/2020  ? Essential hypertension 03/12/2020  ? Mixed hyperlipidemia 01/14/2020  ? Lower extremity edema 01/14/2020  ?  Depression 01/14/2020  ? Class 1 obesity with serious comorbidity and body mass index (BMI) of 34.0 to 34.9 in adult 01/14/2020  ? Primary osteoarthritis of left knee 08/14/2018  ? Primary localized osteoarthritis of

## 2021-11-24 ENCOUNTER — Ambulatory Visit: Payer: Medicare HMO | Admitting: Physical Therapy

## 2021-11-24 DIAGNOSIS — G8929 Other chronic pain: Secondary | ICD-10-CM

## 2021-11-24 DIAGNOSIS — M25551 Pain in right hip: Secondary | ICD-10-CM

## 2021-11-24 DIAGNOSIS — M6281 Muscle weakness (generalized): Secondary | ICD-10-CM

## 2021-11-24 DIAGNOSIS — M62838 Other muscle spasm: Secondary | ICD-10-CM | POA: Diagnosis not present

## 2021-11-24 DIAGNOSIS — R2689 Other abnormalities of gait and mobility: Secondary | ICD-10-CM | POA: Diagnosis not present

## 2021-11-24 DIAGNOSIS — M25651 Stiffness of right hip, not elsewhere classified: Secondary | ICD-10-CM

## 2021-11-24 DIAGNOSIS — M5441 Lumbago with sciatica, right side: Secondary | ICD-10-CM | POA: Diagnosis not present

## 2021-11-24 NOTE — Therapy (Signed)
?[facility] ?[place] ?[place], [place], [address] ?Phone: [phone]   Fax:  [phone] ? ?Physical Therapy Treatment and Discharge ? ?Patient Details  ?Name: Robin Christensen ?MRN: [id number] ?Date of Birth: [birth year] ?Referring Provider (PT): Herminio Commons Dumonski ? ?PHYSICAL THERAPY DISCHARGE SUMMARY ? ?Visits from Start of Care: 28 ? ?Current functional level related to goals / functional outcomes: ?See below ?  ?Remaining deficits: ?See below ?  ?Education / Equipment: ?See below  ? ?Patient agrees to discharge. Patient goals were met. Patient is being discharged due to meeting the stated rehab goals. ? ? ?Encounter Date: 11/24/2021 ? ? PT End of Session - 11/24/21 0846   ? ? Visit Number 28   ? Number of Visits 30   ? Date for PT Re-Evaluation 11/24/21   ? Authorization Type [employer]   ? Authorization - Visit Number 28   ? Progress Note Due on Visit 20   ? PT Start Time [phone]   ? PT Stop Time [phone]   ? PT Time Calculation (min) 37 min   ? Activity Tolerance Patient tolerated treatment well   ? Behavior During Therapy [facility] for tasks assessed/performed   ? ?  ?  ? ?  ? ? ?Past Medical History:  ?Diagnosis Date  ? A-fib ([place])   ? B12 deficiency   ? Back pain   ? Breast cancer ([place])   ? Cancer [facility])   ? Right Breast Cancer  ? Chronic headaches   ? Dysrhythmia   ? AF  ? Edema, lower extremity   ? Gallbladder disease   ? GERD (gastroesophageal reflux disease)   ? Hyperlipidemia   ? Hypertension   ? Hypothyroidism   ? Joint pain   ? Obesity   ? Pneumonia   ? PONV (postoperative nausea and vomiting)   ? Sleep apnea   ? cpcap  ? Vitamin deficiency   ? ? ?Past Surgical History:  ?Procedure Laterality Date  ? BREAST SURGERY    ? CARDIOVERSION N/A 06/26/2014  ? Procedure: CARDIOVERSION;  Surgeon: Laverda Page, MD;  Location: [facility];  Service: Cardiovascular;  Laterality: N/A;  ? CATARACT EXTRACTION    ? CHOLECYSTECTOMY    ? DILATION AND CURETTAGE  OF UTERUS    ? eyelid surgery    ? TOTAL KNEE ARTHROPLASTY Right 05/03/2016  ? Procedure: TOTAL KNEE ARTHROPLASTY;  Surgeon: Melrose Nakayama, MD;  Location: [facility];  Service: Orthopedics;  Laterality: Right;  ? TOTAL KNEE ARTHROPLASTY Left 08/14/2018  ? Procedure: TOTAL KNEE ARTHROPLASTY;  Surgeon: Melrose Nakayama, MD;  Location: [place];  Service: Orthopedics;  Laterality: Left;  ? ? ?There were no vitals filed for this visit. ? ? Subjective Assessment - 11/24/21 0851   ? ? Subjective Pt reports she felt a lot better after last session. Pt had a good day yesterday.   ? Limitations Sitting;Walking;Standing   ? How long can you sit comfortably? Maybe 20-30 minutes   ? How long can you stand comfortably? Maybe 20-30 minutes   ? How long can you walk comfortably? "Just walking around [employer]"   ? 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 No impingement to explain leg symptoms.   ? Patient Stated Goals decrease pain   ? Currently in Pain? Yes   ? Pain Score 4    ? Pain Location Back   ? Pain Onset More than a month ago   ? ?  ?  ? ?  ? ? ? ? ?  [facility] PT Assessment - 11/24/21 0001   ? ?  ? Assessment  ? Medical Diagnosis R piriformis syndrome   ? Referring Provider (PT) Herminio Commons Dumonski   ?  ? Strength  ? Right Hip Extension 4/5   ? Right Hip ABduction 4/5   ? Left Hip Extension 4/5   ? Left Hip ABduction 4/5   ? ?  ?  ? ?  ? ? ? ? ? ? ? ? ? ? ? ? ? ? ? ? [facility] PT Treatment/Exercise - 11/24/21 0001   ? ?  ? Lumbar Exercises: Stretches  ? Press Ups --   x25  ? Press Ups Limitations on elbows   ? Quad Stretch Right;Left;2 reps;30 seconds   ? Other Lumbar Stretch Exercise doorway pec stretch 2x30 sec   ?  ? Lumbar Exercises: Aerobic  ? Tread Mill 1.5 x 5 min   ?  ? Lumbar Exercises: Standing  ? Other Standing Lumbar Exercises shoulder ER blue tband 2x10, "W" blue tband x10; bow and arrow blue tband 2x10   ? Other Standing Lumbar Exercises Mini squat x10; plank on counter x30 sec;   ?  ? Lumbar Exercises:  Prone  ? Straight Leg Raise 10 reps   ? ?  ?  ? ?  ? ? ? ? ? ? ? ? ? ? ? ? PT Short Term Goals - 09/20/21 1032   ? ?  ? PT SHORT TERM GOAL #1  ? Title independent with initial HEP   ? Time 3   ? Period Weeks   ? Status Achieved   ? Target Date 08/30/21   ?  ? PT SHORT TERM GOAL #2  ? Title improve hip strength to at least 3+/5   ? Time 3   ? Period Weeks   ? Status Achieved   ? Target Date 08/30/21   ?  ? PT SHORT TERM GOAL #3  ? Title pt will be able to walk for at least 10 minutes for exercise/increased activity   ? Time 3   ? Period Weeks   ? Status Achieved   ? Target Date 08/30/21   ? ?  ?  ? ?  ? ? ? ? PT Long Term Goals - 11/24/21 0853   ? ?  ? PT LONG TERM GOAL #1  ? Title independent with HEP for hip and core strengthening   ? Time 4   ? Period Weeks   ? Status Achieved   ? Target Date 11/24/21   ?  ? PT LONG TERM GOAL #2  ? Title FOTO score improved to 59   ? Baseline 47   ? Time 6   ? Period Weeks   ? Status Achieved   ? Target Date 11/01/21   ?  ? PT LONG TERM GOAL #3  ? Title Pt will report >50% decrease in pain levels   ? Baseline 25% overall per pt report 11/24/21   ? Time 6   ? Period Weeks   ? Status Partially Met   ? Target Date 11/24/21   ?  ? PT LONG TERM GOAL #4  ? Title Pt will be able to increase hip strength to at least 4-/5 for improved stability with standing and walking   ? Time 4   ? Period Weeks   ? Status Achieved   ? Target Date 11/24/21   ?  ? PT LONG TERM GOAL #5  ? Title Pt  will report improved sleep by at least 50%   ? Baseline Improved by 50% to 75% on 11/03/21   ? Time 4   ? Period Weeks   ? Status Achieved   ? Target Date 11/24/21   ? ?  ?  ? ?  ? ? ? ? ? ? ? ? Plan - 11/24/21 0918   ? ? Clinical Impression Statement Treatment focused on readying pt for d/c. Provided last HEP to continue to work on core and hip strengthening. Continued stretching hip flexors and quads to decrease tightness. Met 4 out of 5 LTGs. Partially met goal for pain decrease.   ? Personal Factors and  Comorbidities Fitness;Age;Time since onset of injury/illness/exacerbation;Past/Current Experience   ? Examination-Activity Limitations Locomotion Level;Transfers;Bed Mobility;Stairs;Squat;Stand;Sit;Sleep   ? Examination-Participation Restrictions Cleaning;Community Activity;Driving;Laundry;Yard Work;Meal Prep;Shop   ? Rehab Potential Good   ? PT Frequency 2x / week   ? PT Duration 4 weeks   ? PT Treatment/Interventions ADLs/Self Care Home Management;Aquatic Therapy;Cryotherapy;Electrical Stimulation;Iontophoresis 13m/ml Dexamethasone;Moist Heat;Traction;Gait training;Stair training;Functional mobility training;Therapeutic activities;Therapeutic exercise;Balance training;Neuromuscular re-education;Manual techniques;Patient/family education;Dry needling;Passive range of motion;Taping;Vestibular   ? PT Next Visit Plan Progress core, back, and hip strengthening. Work on extensions   ? PT Home Exercise Plan Access Code: [email or web address]  ? Consulted and Agree with Plan of Care Patient   ? ?  ?  ? ?  ? ? ?Patient will benefit from skilled therapeutic intervention in order to improve the following deficits and impairments:  Abnormal gait, Decreased range of motion, Difficulty walking, Increased fascial restricitons, Increased muscle spasms, Decreased activity tolerance, Pain, Hypomobility, Decreased mobility, Decreased strength, Postural dysfunction ? ?Visit Diagnosis: ?Muscle weakness (generalized) ? ?Chronic right-sided low back pain with right-sided sciatica ? ?Pain in right hip ? ?Stiffness of right hip, not elsewhere classified ? ? ? ? ?Problem List ?Patient Active Problem List  ? Diagnosis Date Noted  ? Other fatigue increased 06/01/2020  ? Atrial fibrillation ([place] 04/23/2020  ? Vitamin D deficiency 04/08/2020  ? Hyperglycemia 03/12/2020  ? Essential hypertension 03/12/2020  ? Mixed hyperlipidemia 01/14/2020  ? Lower extremity edema 01/14/2020  ? Depression 01/14/2020  ? Class 1 obesity with serious comorbidity and body  mass index (BMI) of 34.0 to 34.9 in adult 01/14/2020  ? Primary osteoarthritis of left knee 08/14/2018  ? Primary localized osteoarthritis of right knee 05/03/2016  ? Primary osteoarthritis of right knee 05/04/19

## 2021-11-30 ENCOUNTER — Other Ambulatory Visit: Payer: Self-pay | Admitting: Neurological Surgery

## 2021-11-30 DIAGNOSIS — Z6841 Body Mass Index (BMI) 40.0 and over, adult: Secondary | ICD-10-CM | POA: Diagnosis not present

## 2021-11-30 DIAGNOSIS — M5136 Other intervertebral disc degeneration, lumbar region: Secondary | ICD-10-CM | POA: Diagnosis not present

## 2021-12-10 DIAGNOSIS — E785 Hyperlipidemia, unspecified: Secondary | ICD-10-CM | POA: Diagnosis not present

## 2021-12-10 DIAGNOSIS — I4891 Unspecified atrial fibrillation: Secondary | ICD-10-CM | POA: Diagnosis not present

## 2021-12-10 DIAGNOSIS — I10 Essential (primary) hypertension: Secondary | ICD-10-CM | POA: Diagnosis not present

## 2021-12-10 DIAGNOSIS — E039 Hypothyroidism, unspecified: Secondary | ICD-10-CM | POA: Diagnosis not present

## 2021-12-18 ENCOUNTER — Ambulatory Visit
Admission: RE | Admit: 2021-12-18 | Discharge: 2021-12-18 | Disposition: A | Discharge status: Home / Self Care | Payer: Medicare HMO | Source: Ambulatory Visit | Attending: Neurological Surgery | Admitting: Neurological Surgery

## 2021-12-18 DIAGNOSIS — M545 Low back pain, unspecified: Secondary | ICD-10-CM | POA: Diagnosis not present

## 2021-12-18 DIAGNOSIS — M47816 Spondylosis without myelopathy or radiculopathy, lumbar region: Secondary | ICD-10-CM | POA: Diagnosis not present

## 2021-12-18 DIAGNOSIS — M5136 Other intervertebral disc degeneration, lumbar region: Secondary | ICD-10-CM

## 2021-12-23 DIAGNOSIS — M5136 Other intervertebral disc degeneration, lumbar region: Secondary | ICD-10-CM | POA: Diagnosis not present

## 2022-01-16 ENCOUNTER — Other Ambulatory Visit: Payer: Self-pay | Admitting: Neurology

## 2022-02-03 DIAGNOSIS — I4891 Unspecified atrial fibrillation: Secondary | ICD-10-CM | POA: Diagnosis not present

## 2022-02-03 DIAGNOSIS — E785 Hyperlipidemia, unspecified: Secondary | ICD-10-CM | POA: Diagnosis not present

## 2022-02-03 DIAGNOSIS — I700 Atherosclerosis of aorta: Secondary | ICD-10-CM | POA: Diagnosis not present

## 2022-02-03 DIAGNOSIS — D6869 Other thrombophilia: Secondary | ICD-10-CM | POA: Diagnosis not present

## 2022-02-03 DIAGNOSIS — M549 Dorsalgia, unspecified: Secondary | ICD-10-CM | POA: Diagnosis not present

## 2022-02-03 DIAGNOSIS — Z6841 Body Mass Index (BMI) 40.0 and over, adult: Secondary | ICD-10-CM | POA: Diagnosis not present

## 2022-02-03 DIAGNOSIS — I10 Essential (primary) hypertension: Secondary | ICD-10-CM | POA: Diagnosis not present

## 2022-02-03 DIAGNOSIS — E039 Hypothyroidism, unspecified: Secondary | ICD-10-CM | POA: Diagnosis not present

## 2022-02-09 DIAGNOSIS — E785 Hyperlipidemia, unspecified: Secondary | ICD-10-CM | POA: Diagnosis not present

## 2022-02-09 DIAGNOSIS — E039 Hypothyroidism, unspecified: Secondary | ICD-10-CM | POA: Diagnosis not present

## 2022-02-09 DIAGNOSIS — I10 Essential (primary) hypertension: Secondary | ICD-10-CM | POA: Diagnosis not present

## 2022-02-09 DIAGNOSIS — I4891 Unspecified atrial fibrillation: Secondary | ICD-10-CM | POA: Diagnosis not present

## 2022-02-16 ENCOUNTER — Ambulatory Visit: Payer: Medicare HMO | Admitting: Rehabilitative and Restorative Service Providers"

## 2022-02-23 ENCOUNTER — Encounter (INDEPENDENT_AMBULATORY_CARE_PROVIDER_SITE_OTHER): Payer: Self-pay

## 2022-02-23 DIAGNOSIS — M7062 Trochanteric bursitis, left hip: Secondary | ICD-10-CM | POA: Diagnosis not present

## 2022-02-23 DIAGNOSIS — M5451 Vertebrogenic low back pain: Secondary | ICD-10-CM | POA: Diagnosis not present

## 2022-02-23 DIAGNOSIS — M25552 Pain in left hip: Secondary | ICD-10-CM | POA: Diagnosis not present

## 2022-03-16 DIAGNOSIS — I4891 Unspecified atrial fibrillation: Secondary | ICD-10-CM | POA: Diagnosis not present

## 2022-03-16 DIAGNOSIS — E785 Hyperlipidemia, unspecified: Secondary | ICD-10-CM | POA: Diagnosis not present

## 2022-03-16 DIAGNOSIS — E039 Hypothyroidism, unspecified: Secondary | ICD-10-CM | POA: Diagnosis not present

## 2022-03-16 DIAGNOSIS — I10 Essential (primary) hypertension: Secondary | ICD-10-CM | POA: Diagnosis not present

## 2022-04-12 NOTE — Progress Notes (Signed)
Virtual Visit via Video Note  Consent was obtained for video visit:  Yes.   Answered questions that patient had about telehealth interaction:  Yes.   I discussed the limitations, risks, security and privacy concerns of performing an evaluation and management service by telemedicine. I also discussed with the patient that there may be a patient responsible charge related to this service. The patient expressed understanding and agreed to proceed.  Pt location: Home Physician Location: office Name of referring provider:  London Pepper, MD I connected with [name] at patients initiation/request on 04/15/2022 at  8:50 AM EDT by video enabled telemedicine application and verified that I am speaking with the correct person using two identifiers. Pt MRN:  [id number] Pt DOB:  1950/09/16 Video Participants:  [name];   Assessment/Plan:   Episodic tension-type headache, not intractable  Headache prevention:  zonisamide '50mg'$  daily Headache rescue:  Extra-strength Tylenol with coffee Limit use of pain relievers to no more than 2 days out of week to prevent risk of rebound or medication-overuse headache. Keep headache diary Follow up one year   Subjective:  Robin Christensen is a 71 year old right-handed female with hypothyroidism, hyperlipidemia, hypertension, OSA, atrial fibrillation status post cardioversion and history of breast cancer status post mastectomy who follows up for headache.   UPDATE: Intensity:  Moderate Duration:  May last for hours Frequency:   1 to 2 times every 3 months May have a severe headache lasting several months and occurs every 3 months. Current NSAIDS: None Current analgesics: Tylenol Extra-sength Current triptans: None Current ergotamine: None Current anti-emetic: None Current muscle relaxants: None Current anti-anxiolytic: None Current sleep aide: None Current Antihypertensive medications: Lasix Current Antidepressant medications:  none Current Anticonvulsant medications: zonisamide '50mg'$ , gabapentin '300mg'$  QID (for back pain) Current anti-CGRP: None Current Vitamins/Herbal/Supplements: KCl, B12 Current Antihistamines/Decongestants: None Other therapy: None Other medications: levothyroxine, Eliqis   Caffeine: No Alcohol: No Smoker: No Diet: Hydrates Exercise: Walks 3 miles 5 days a week Depression: No; Anxiety: No Other pain: Lumbar spinal stenosis.   Sleep hygiene: Sleeps 4 to 5 hours straight, wakes up for 2 hours and then sleeps for another 2 hours.  She has OSA which is successfully treated with CPAP.   She was advised to follow up with her dentist to evaluate for TMJ dysfunction.  She hasn't had a chance to go to the walker.   HISTORY: Onset:  2018.  At the time, they were severe and daily.  It was found that her OSA was uncontrolled and CPAP was adjusted.  Headaches resolved but returned about 2 months ago, albeit less severe.  CPAP was rechecked and is adequate. Location:  Bifrontal/across top of head Quality:  pressure Initial intensity:  mild.  She denies new headache, thunderclap headache or severe headache that wakes her from sleep. Aura:  no Prodrome:  no Postdrome:  no Associated symptoms:  None. Initially some nausea a year ago.  Now without nausea, vomiting, photophobia, phonophobia, autonomic symptoms or visual disturbance.  She denies associated unilateral numbness or weakness. Initial Duration:  1 hour to all day Initial Frequency:  3 days a week Initial Frequency of abortive medication: Extra-strength Tylenol 3 days a week Triggers:  Emotional stress Relieving factors:  When occupied by other activities Activity:  Does not aggravate She endorses aching pain in her jaw, right worse than left.  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  She hasn't seen a dentist in years.  She has  seen ophthalmology and had cataract surgery this year.  Vision is improved.     MRI of brain  without and with contrast from 01/15/18 was personally reviewed and was unremarkable except for incidental partial empty sella.   Past NSAIDS: ibuprofen, naproxen Past analgesics:  no Past abortive triptans:  no Past muscle relaxants:  no Past anti-emetic:  no Past antihypertensive medications:  Toprol XL Past antidepressant medications:  Nortriptyline '10mg'$  (hunger/weight gain, sluggishness), Cymbalta '60mg'$  Past anticonvulsant medications:   Topiramate 25 mg at bedtime (discontinued due to cognitive deficits) Past vitamins/Herbal/Supplements:  no Past antihistamines/decongestants:  no Other past therapies:  no   She has history of headaches off and on throughout her life.  Family history of headache:  No   In August 2019, she endorsed cognitive changes.  She would repeat things to other people after just 15 minutes.  Another time, she briefly thought her grandchildren were in school but they were out for the summer.  She did recognize the refrigerator repair man.  She was started on B12 supplementation.  Due to these changes, topiramate was discontinued and she was started on gabapentin.  However, she is now not on gabapentin.  She is currently on Cymbalta.  Sleep study was okay.  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  She has a follow up appointment in a couple of weeks. Workup is ongoing.   Past Medical History: Past Medical History:  Diagnosis Date   A-fib ([place])    B12 deficiency    Back pain    Breast cancer ([place])    Cancer ([facility])    Right Breast Cancer   Chronic headaches    Dysrhythmia    AF   Edema, lower extremity    Gallbladder disease    GERD (gastroesophageal reflux disease)    Hyperlipidemia    Hypertension    Hypothyroidism    Joint pain    Obesity    Pneumonia    PONV (postoperative nausea and vomiting)    Sleep apnea    cpcap   Vitamin deficiency     Medications: Outpatient Encounter Medications as of  04/15/2022  Medication Sig Note   acetaminophen (TYLENOL) 500 MG tablet Take 500 mg by mouth 3 (three) times daily as needed for mild pain or headache.     CALCIUM-VITAMIN D PO Take 2 tablets by mouth every evening.    ELIQUIS 5 MG TABS tablet TAKE 1 TABLET TWICE DAILY    furosemide (LASIX) 40 MG tablet Take 40 mg by mouth daily.    gabapentin (NEURONTIN) 300 MG capsule Take 300 mg by mouth 3 (three) times daily. 04/14/2021: Four times a day   levothyroxine (SYNTHROID, LEVOTHROID) 75 MCG tablet Take 75 mcg by mouth daily before breakfast.    mirabegron ER (MYRBETRIQ) 50 MG TB24 tablet Take 50 mg by mouth daily.    potassium chloride SA (K-DUR,KLOR-CON) 20 MEQ tablet Take 20 mEq by mouth daily.     pravastatin (PRAVACHOL) 40 MG tablet Take 40 mg by mouth daily.     vitamin B-12 (CYANOCOBALAMIN) 1000 MCG tablet Take 1,000 mcg by mouth daily.    zonisamide (ZONEGRAN) 50 MG capsule TAKE ONE CAPSULE BY MOUTH ONCE DAILY    buPROPion (WELLBUTRIN SR) 150 MG 12 hr tablet Take 1 tablet (150 mg total) by mouth daily. (Patient not taking: Reported on 04/15/2022)    diphenhydrAMINE (BENADRYL) 50 MG capsule Take 50 mg by mouth every 6 (six)  hours as needed. (Patient not taking: Reported on 04/14/2021)    No facility-administered encounter medications on file as of 04/15/2022.    Allergies: Allergies  Allergen Reactions   Xarelto [Rivaroxaban] Other (See Comments)    JOINT ACHES/PAIN    Family History: Family History  Problem Relation Age of Onset   High blood pressure Mother    Heart disease Mother    Cancer Mother    Depression Mother    Obesity Mother    Heart disease Father    Kidney disease Father    Cancer Father     Observations/Objective:   Height '5\' 8"'$  (1.727 m), weight 285 lb (129.3 kg). No acute distress.  Alert and oriented.  Speech fluent and not dysarthric.  Language intact.     Follow Up Instructions:    -I discussed the assessment and treatment plan with the patient. The  patient was provided an opportunity to ask questions and all were answered. The patient agreed with the plan and demonstrated an understanding of the instructions.   The patient was advised to call back or seek an in-person evaluation if the symptoms worsen or if the condition fails to improve as anticipated.   Dudley Major, DO  CC: London Pepper, MD

## 2022-04-14 DIAGNOSIS — Z1231 Encounter for screening mammogram for malignant neoplasm of breast: Secondary | ICD-10-CM | POA: Diagnosis not present

## 2022-04-15 ENCOUNTER — Encounter: Payer: Self-pay | Admitting: Neurology

## 2022-04-15 ENCOUNTER — Telehealth (INDEPENDENT_AMBULATORY_CARE_PROVIDER_SITE_OTHER): Payer: Medicare HMO | Admitting: Neurology

## 2022-04-15 VITALS — Ht 68.0 in | Wt 285.0 lb

## 2022-04-15 DIAGNOSIS — G44219 Episodic tension-type headache, not intractable: Secondary | ICD-10-CM | POA: Diagnosis not present

## 2022-04-15 MED ORDER — ZONISAMIDE 50 MG PO CAPS: 50.0000 mg | ORAL_CAPSULE | Freq: Every day | ORAL | 3 refills | Status: DC

## 2022-04-15 NOTE — Patient Instructions (Signed)
Zonisamide '50mg'$  daily Limit use of pain relievers to no more than 2 days out of week to prevent risk of rebound or medication-overuse headache. Follow up 1 year.

## 2022-04-21 DIAGNOSIS — E785 Hyperlipidemia, unspecified: Secondary | ICD-10-CM | POA: Diagnosis not present

## 2022-04-21 DIAGNOSIS — E039 Hypothyroidism, unspecified: Secondary | ICD-10-CM | POA: Diagnosis not present

## 2022-04-21 DIAGNOSIS — I10 Essential (primary) hypertension: Secondary | ICD-10-CM | POA: Diagnosis not present

## 2022-04-21 DIAGNOSIS — I4891 Unspecified atrial fibrillation: Secondary | ICD-10-CM | POA: Diagnosis not present

## 2022-05-20 DIAGNOSIS — E039 Hypothyroidism, unspecified: Secondary | ICD-10-CM | POA: Diagnosis not present

## 2022-05-20 DIAGNOSIS — E785 Hyperlipidemia, unspecified: Secondary | ICD-10-CM | POA: Diagnosis not present

## 2022-05-20 DIAGNOSIS — I10 Essential (primary) hypertension: Secondary | ICD-10-CM | POA: Diagnosis not present

## 2022-05-20 DIAGNOSIS — I4891 Unspecified atrial fibrillation: Secondary | ICD-10-CM | POA: Diagnosis not present

## 2022-05-24 DIAGNOSIS — I10 Essential (primary) hypertension: Secondary | ICD-10-CM | POA: Diagnosis not present

## 2022-05-24 DIAGNOSIS — E039 Hypothyroidism, unspecified: Secondary | ICD-10-CM | POA: Diagnosis not present

## 2022-05-24 DIAGNOSIS — M5430 Sciatica, unspecified side: Secondary | ICD-10-CM | POA: Diagnosis not present

## 2022-05-24 DIAGNOSIS — E785 Hyperlipidemia, unspecified: Secondary | ICD-10-CM | POA: Diagnosis not present

## 2022-05-24 DIAGNOSIS — R7309 Other abnormal glucose: Secondary | ICD-10-CM | POA: Diagnosis not present

## 2022-05-24 DIAGNOSIS — Z0000 Encounter for general adult medical examination without abnormal findings: Secondary | ICD-10-CM | POA: Diagnosis not present

## 2022-05-24 DIAGNOSIS — E538 Deficiency of other specified B group vitamins: Secondary | ICD-10-CM | POA: Diagnosis not present

## 2022-05-24 DIAGNOSIS — Z6841 Body Mass Index (BMI) 40.0 and over, adult: Secondary | ICD-10-CM | POA: Diagnosis not present

## 2022-05-24 DIAGNOSIS — I4891 Unspecified atrial fibrillation: Secondary | ICD-10-CM | POA: Diagnosis not present

## 2022-05-24 DIAGNOSIS — Z23 Encounter for immunization: Secondary | ICD-10-CM | POA: Diagnosis not present

## 2022-06-21 DIAGNOSIS — Z780 Asymptomatic menopausal state: Secondary | ICD-10-CM | POA: Diagnosis not present

## 2022-06-21 DIAGNOSIS — M85852 Other specified disorders of bone density and structure, left thigh: Secondary | ICD-10-CM | POA: Diagnosis not present

## 2022-07-04 DIAGNOSIS — R945 Abnormal results of liver function studies: Secondary | ICD-10-CM | POA: Diagnosis not present

## 2022-07-04 DIAGNOSIS — R197 Diarrhea, unspecified: Secondary | ICD-10-CM | POA: Diagnosis not present

## 2022-07-04 DIAGNOSIS — R110 Nausea: Secondary | ICD-10-CM | POA: Diagnosis not present

## 2022-07-15 ENCOUNTER — Other Ambulatory Visit (HOSPITAL_BASED_OUTPATIENT_CLINIC_OR_DEPARTMENT_OTHER): Payer: Self-pay | Admitting: Family Medicine

## 2022-07-15 DIAGNOSIS — R1032 Left lower quadrant pain: Secondary | ICD-10-CM

## 2022-07-19 ENCOUNTER — Ambulatory Visit (HOSPITAL_BASED_OUTPATIENT_CLINIC_OR_DEPARTMENT_OTHER)
Admission: RE | Admit: 2022-07-19 | Discharge: 2022-07-19 | Disposition: A | Discharge status: Home / Self Care | Payer: Medicare HMO | Source: Ambulatory Visit | Attending: Family Medicine | Admitting: Family Medicine

## 2022-07-19 DIAGNOSIS — N281 Cyst of kidney, acquired: Secondary | ICD-10-CM | POA: Diagnosis not present

## 2022-07-19 DIAGNOSIS — K838 Other specified diseases of biliary tract: Secondary | ICD-10-CM | POA: Diagnosis not present

## 2022-07-19 DIAGNOSIS — R1032 Left lower quadrant pain: Secondary | ICD-10-CM | POA: Diagnosis not present

## 2022-07-19 LAB — POCT I-STAT CREATININE: Creatinine, Ser: 0.9 mg/dL (ref 0.44–1.00)

## 2022-07-19 MED ORDER — IOHEXOL 300 MG/ML  SOLN
100.0000 mL | Freq: Once | INTRAMUSCULAR | Status: AC | PRN
  Administered 2022-07-19: 100 mL via INTRAVENOUS

## 2022-08-05 ENCOUNTER — Ambulatory Visit
Admission: RE | Admit: 2022-08-05 | Discharge: 2022-08-05 | Disposition: A | Discharge status: Home / Self Care | Payer: Medicare HMO | Source: Ambulatory Visit | Attending: Gastroenterology | Admitting: Gastroenterology

## 2022-08-05 ENCOUNTER — Other Ambulatory Visit: Payer: Self-pay | Admitting: Gastroenterology

## 2022-08-05 DIAGNOSIS — R194 Change in bowel habit: Secondary | ICD-10-CM

## 2022-08-05 DIAGNOSIS — K5900 Constipation, unspecified: Secondary | ICD-10-CM | POA: Diagnosis not present

## 2022-08-05 DIAGNOSIS — R109 Unspecified abdominal pain: Secondary | ICD-10-CM | POA: Diagnosis not present

## 2022-08-05 DIAGNOSIS — R7989 Other specified abnormal findings of blood chemistry: Secondary | ICD-10-CM | POA: Diagnosis not present

## 2022-08-23 DIAGNOSIS — R194 Change in bowel habit: Secondary | ICD-10-CM | POA: Diagnosis not present

## 2022-08-23 DIAGNOSIS — D122 Benign neoplasm of ascending colon: Secondary | ICD-10-CM | POA: Diagnosis not present

## 2022-08-23 DIAGNOSIS — D120 Benign neoplasm of cecum: Secondary | ICD-10-CM | POA: Diagnosis not present

## 2022-08-23 DIAGNOSIS — K648 Other hemorrhoids: Secondary | ICD-10-CM | POA: Diagnosis not present

## 2022-08-25 DIAGNOSIS — D120 Benign neoplasm of cecum: Secondary | ICD-10-CM | POA: Diagnosis not present

## 2022-08-25 DIAGNOSIS — D122 Benign neoplasm of ascending colon: Secondary | ICD-10-CM | POA: Diagnosis not present

## 2022-08-25 DIAGNOSIS — I10 Essential (primary) hypertension: Secondary | ICD-10-CM | POA: Diagnosis not present

## 2022-08-25 DIAGNOSIS — I700 Atherosclerosis of aorta: Secondary | ICD-10-CM | POA: Diagnosis not present

## 2022-08-25 DIAGNOSIS — I4891 Unspecified atrial fibrillation: Secondary | ICD-10-CM | POA: Diagnosis not present

## 2022-08-25 DIAGNOSIS — E785 Hyperlipidemia, unspecified: Secondary | ICD-10-CM | POA: Diagnosis not present

## 2022-08-25 DIAGNOSIS — D6869 Other thrombophilia: Secondary | ICD-10-CM | POA: Diagnosis not present

## 2022-09-20 DIAGNOSIS — Z961 Presence of intraocular lens: Secondary | ICD-10-CM | POA: Diagnosis not present

## 2022-09-20 DIAGNOSIS — H524 Presbyopia: Secondary | ICD-10-CM | POA: Diagnosis not present

## 2022-11-02 DIAGNOSIS — N3946 Mixed incontinence: Secondary | ICD-10-CM | POA: Diagnosis not present

## 2022-11-02 DIAGNOSIS — R350 Frequency of micturition: Secondary | ICD-10-CM | POA: Diagnosis not present

## 2022-11-24 DIAGNOSIS — I4891 Unspecified atrial fibrillation: Secondary | ICD-10-CM | POA: Diagnosis not present

## 2022-11-24 DIAGNOSIS — I10 Essential (primary) hypertension: Secondary | ICD-10-CM | POA: Diagnosis not present

## 2022-11-24 DIAGNOSIS — E785 Hyperlipidemia, unspecified: Secondary | ICD-10-CM | POA: Diagnosis not present

## 2022-11-24 DIAGNOSIS — E039 Hypothyroidism, unspecified: Secondary | ICD-10-CM | POA: Diagnosis not present

## 2022-11-24 DIAGNOSIS — D6869 Other thrombophilia: Secondary | ICD-10-CM | POA: Diagnosis not present

## 2022-11-30 DIAGNOSIS — Z6841 Body Mass Index (BMI) 40.0 and over, adult: Secondary | ICD-10-CM | POA: Diagnosis not present

## 2022-11-30 DIAGNOSIS — Z1331 Encounter for screening for depression: Secondary | ICD-10-CM | POA: Diagnosis not present

## 2022-11-30 DIAGNOSIS — E785 Hyperlipidemia, unspecified: Secondary | ICD-10-CM | POA: Diagnosis not present

## 2022-11-30 DIAGNOSIS — E559 Vitamin D deficiency, unspecified: Secondary | ICD-10-CM | POA: Diagnosis not present

## 2022-11-30 DIAGNOSIS — R632 Polyphagia: Secondary | ICD-10-CM | POA: Diagnosis not present

## 2022-11-30 DIAGNOSIS — G4734 Idiopathic sleep related nonobstructive alveolar hypoventilation: Secondary | ICD-10-CM | POA: Diagnosis not present

## 2022-11-30 DIAGNOSIS — R7303 Prediabetes: Secondary | ICD-10-CM | POA: Diagnosis not present

## 2022-11-30 DIAGNOSIS — G4733 Obstructive sleep apnea (adult) (pediatric): Secondary | ICD-10-CM | POA: Diagnosis not present

## 2022-11-30 DIAGNOSIS — I700 Atherosclerosis of aorta: Secondary | ICD-10-CM | POA: Diagnosis not present

## 2022-12-14 DIAGNOSIS — R7303 Prediabetes: Secondary | ICD-10-CM | POA: Diagnosis not present

## 2022-12-14 DIAGNOSIS — E785 Hyperlipidemia, unspecified: Secondary | ICD-10-CM | POA: Diagnosis not present

## 2022-12-14 DIAGNOSIS — E559 Vitamin D deficiency, unspecified: Secondary | ICD-10-CM | POA: Diagnosis not present

## 2022-12-14 DIAGNOSIS — I700 Atherosclerosis of aorta: Secondary | ICD-10-CM | POA: Diagnosis not present

## 2022-12-14 DIAGNOSIS — Z6841 Body Mass Index (BMI) 40.0 and over, adult: Secondary | ICD-10-CM | POA: Diagnosis not present

## 2022-12-14 DIAGNOSIS — R632 Polyphagia: Secondary | ICD-10-CM | POA: Diagnosis not present

## 2022-12-28 DIAGNOSIS — I700 Atherosclerosis of aorta: Secondary | ICD-10-CM | POA: Diagnosis not present

## 2022-12-28 DIAGNOSIS — Z6841 Body Mass Index (BMI) 40.0 and over, adult: Secondary | ICD-10-CM | POA: Diagnosis not present

## 2022-12-28 DIAGNOSIS — R7303 Prediabetes: Secondary | ICD-10-CM | POA: Diagnosis not present

## 2022-12-28 DIAGNOSIS — E785 Hyperlipidemia, unspecified: Secondary | ICD-10-CM | POA: Diagnosis not present

## 2022-12-28 DIAGNOSIS — R632 Polyphagia: Secondary | ICD-10-CM | POA: Diagnosis not present

## 2022-12-28 DIAGNOSIS — E559 Vitamin D deficiency, unspecified: Secondary | ICD-10-CM | POA: Diagnosis not present

## 2023-01-25 DIAGNOSIS — Z6841 Body Mass Index (BMI) 40.0 and over, adult: Secondary | ICD-10-CM | POA: Diagnosis not present

## 2023-01-25 DIAGNOSIS — M549 Dorsalgia, unspecified: Secondary | ICD-10-CM | POA: Diagnosis not present

## 2023-01-25 DIAGNOSIS — M2550 Pain in unspecified joint: Secondary | ICD-10-CM | POA: Diagnosis not present

## 2023-01-28 IMAGING — CT CT ABD-PELV W/ CM
1 of 3 series · 13 of 32 positions shown, 19 images · IV contrast (iopamidol)
Comparison: None.

CLINICAL DATA: Epigastric pain and diarrhea for 1 month. Personal
history of breast carcinoma.

EXAM:
CT ABDOMEN AND PELVIS WITH CONTRAST
TECHNIQUE: Multidetector CT imaging of the abdomen and pelvis was performed
using the standard protocol following bolus administration of
intravenous contrast.
CONTRAST:  100mL CJZYOZ-QPP IOPAMIDOL (CJZYOZ-QPP) INJECTION 61%

[Series 2: abd/pelvis w/cm · axial · 0.98mm/px · z∈[-473,-38]mm · 13 of 103 slices shown, 19 images]
[im 8/103  soft-tissue]
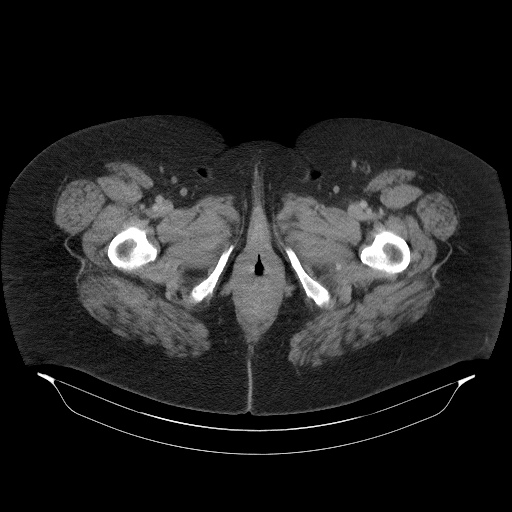
[im 8/103  bone]
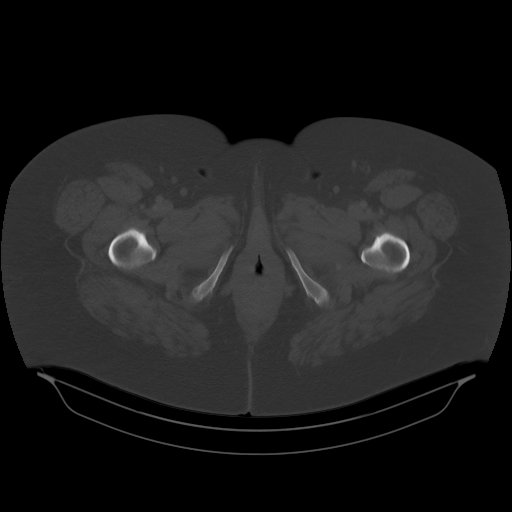
[im 15/103  soft-tissue]
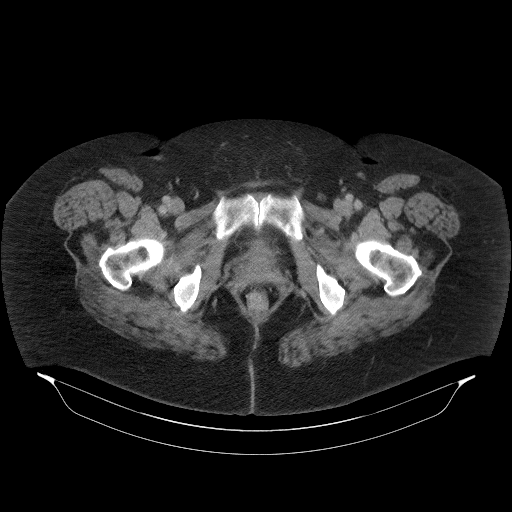
[im 22/103  soft-tissue]
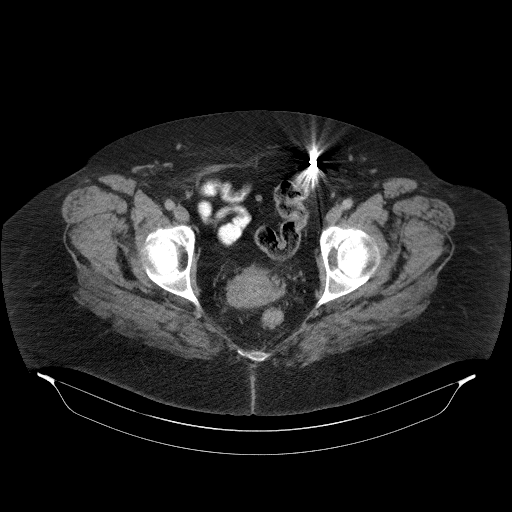
[im 30/103  soft-tissue]
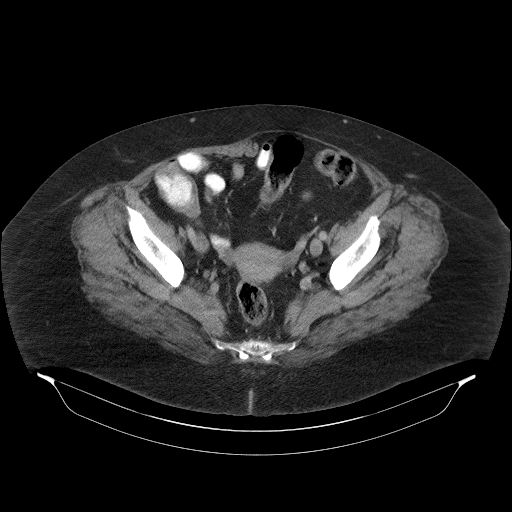
[im 37/103  soft-tissue]
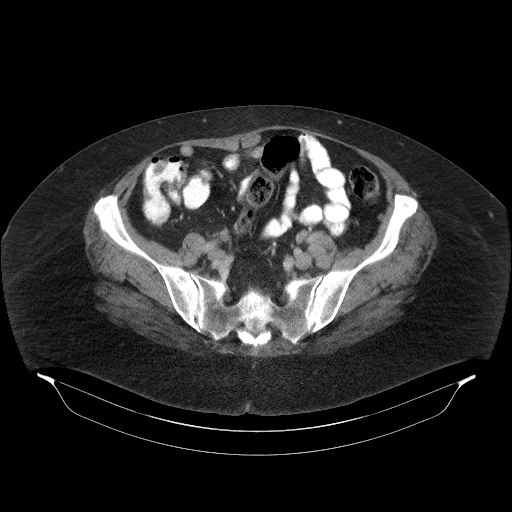
[im 44/103  soft-tissue]
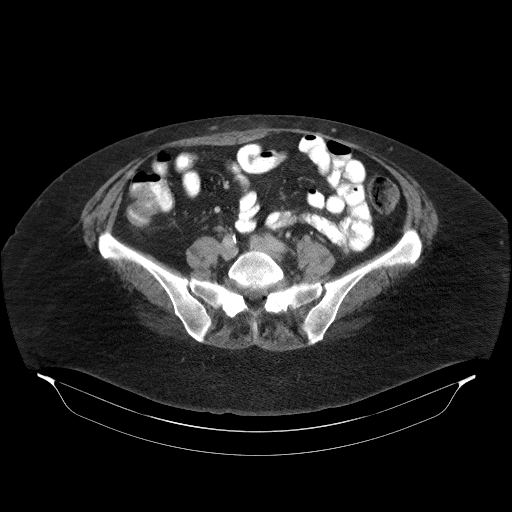
[im 52/103  soft-tissue]
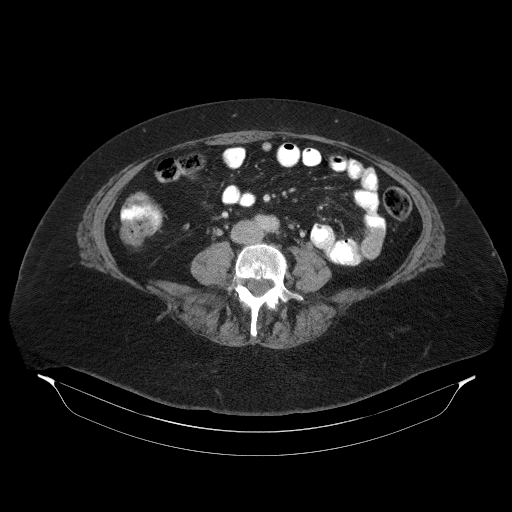
[im 59/103  soft-tissue]
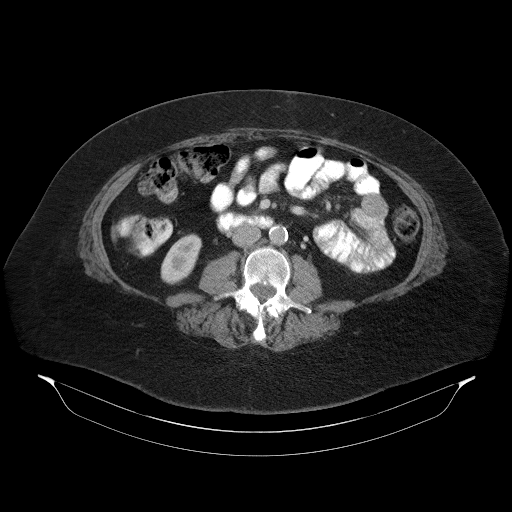
[im 66/103  soft-tissue]
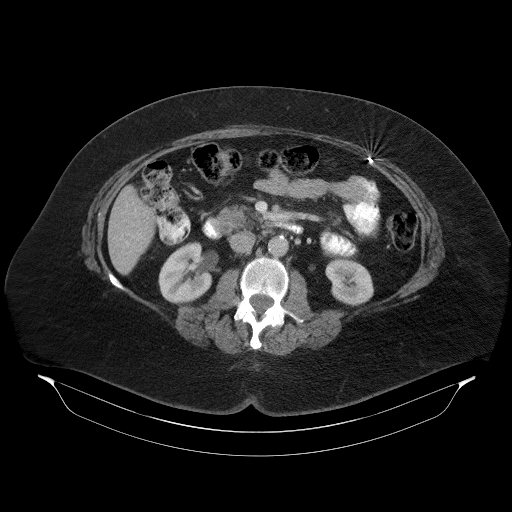
[im 66/103  bone]
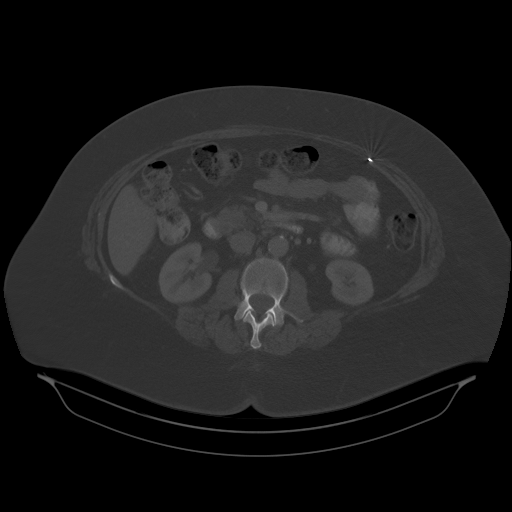
[im 73/103  soft-tissue]
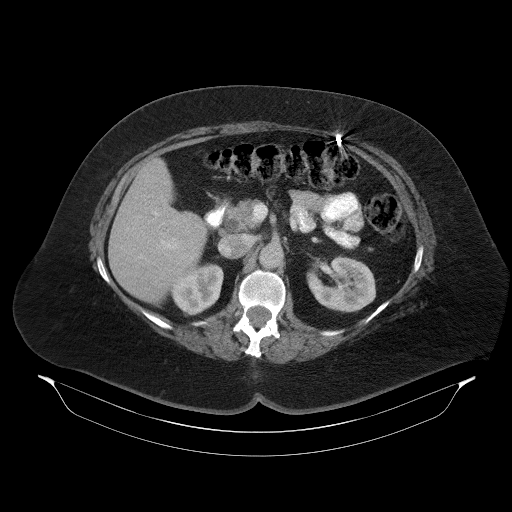
[im 73/103  lung]
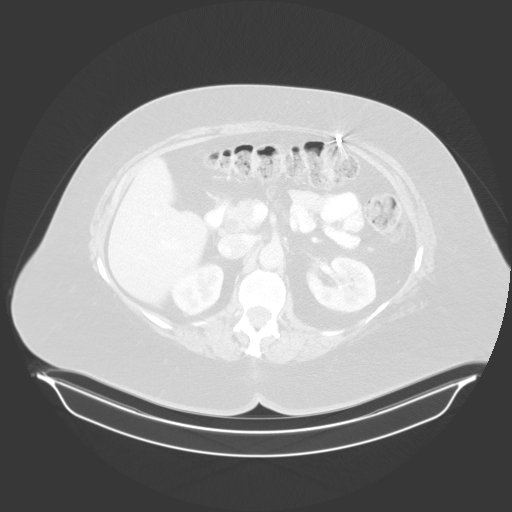
[im 81/103  soft-tissue]
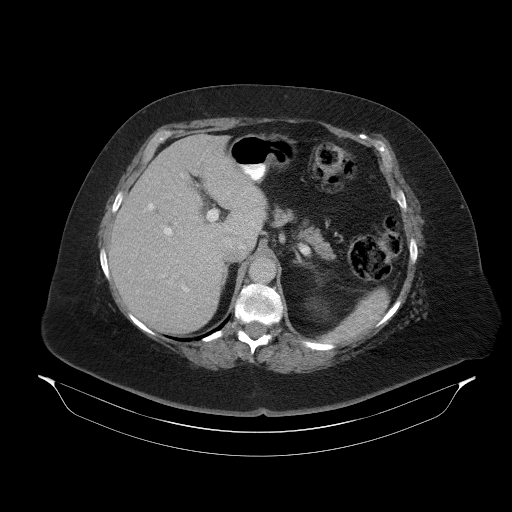
[im 81/103  lung]
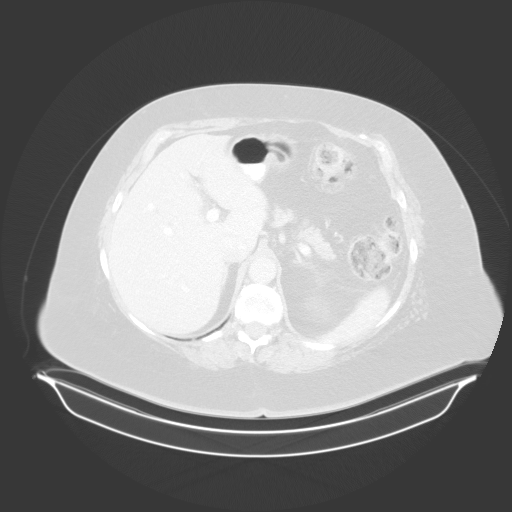
[im 88/103  soft-tissue]
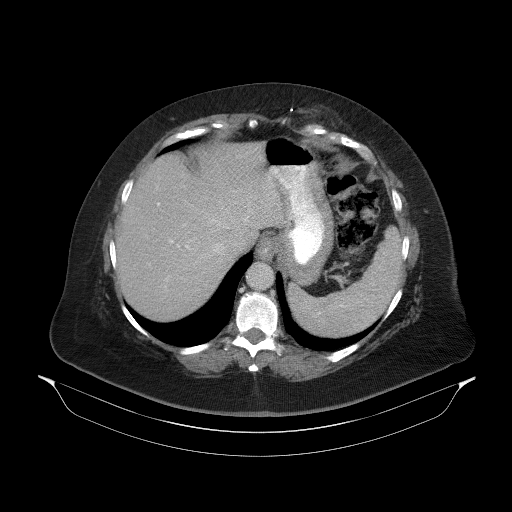
[im 88/103  lung]
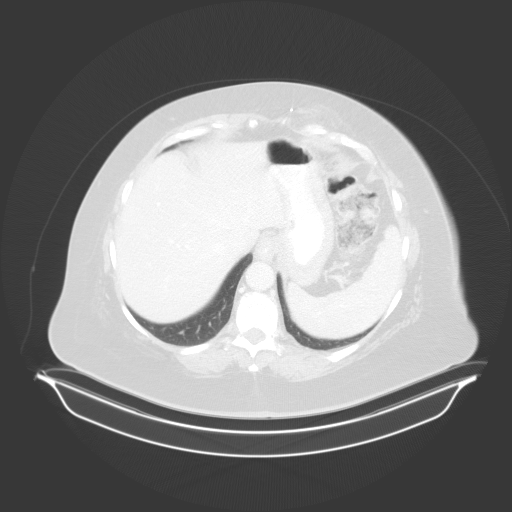
[im 95/103  soft-tissue]
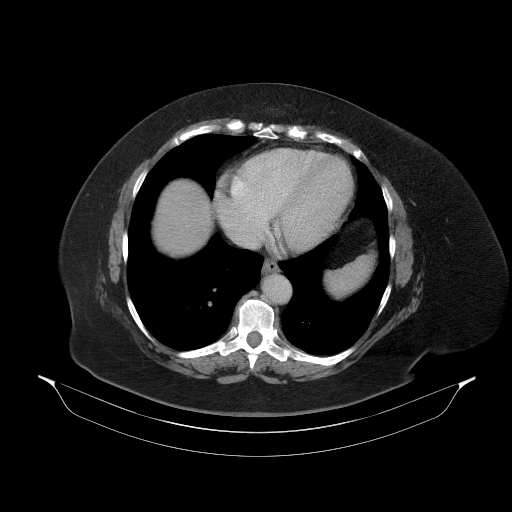
[im 95/103  lung]
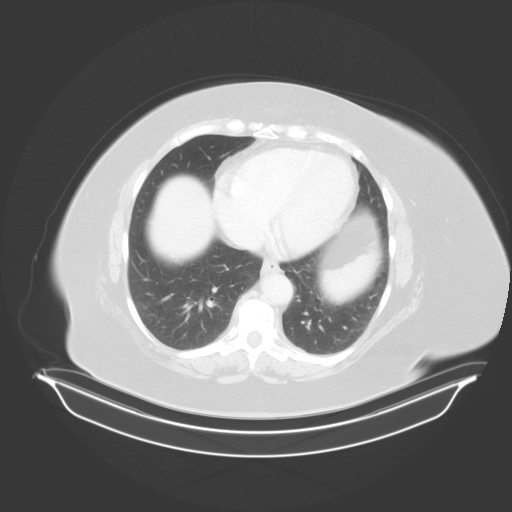

[13 of 32 positions shown; findings below may reference images not displayed]

FINDINGS: Lower Chest: No acute findings.

Hepatobiliary: No hepatic masses identified. Prior cholecystectomy.
No evidence of biliary obstruction.

Pancreas:  No mass or inflammatory changes.

Spleen: Within normal limits in size and appearance.

Adrenals/Urinary Tract: No masses identified. Scarring is seen
involving the upper pole of the left kidney. No evidence of ureteral
calculi or hydronephrosis.

Stomach/Bowel: No evidence of obstruction, inflammatory process or
abnormal fluid collections. Normal appendix visualized.

Vascular/Lymphatic: No pathologically enlarged lymph nodes. No acute
vascular findings. Aortic atherosclerotic calcification noted.

Reproductive:  No mass or other significant abnormality.

Other:  None.

Musculoskeletal:  No suspicious bone lesions identified.
IMPRESSION: No acute findings within the abdomen or pelvis.

Aortic Atherosclerosis ([id number]-B3X.X).

## 2023-01-30 DIAGNOSIS — M6281 Muscle weakness (generalized): Secondary | ICD-10-CM | POA: Diagnosis not present

## 2023-01-30 DIAGNOSIS — M5416 Radiculopathy, lumbar region: Secondary | ICD-10-CM | POA: Diagnosis not present

## 2023-01-30 DIAGNOSIS — M79662 Pain in left lower leg: Secondary | ICD-10-CM | POA: Diagnosis not present

## 2023-01-30 DIAGNOSIS — M545 Low back pain, unspecified: Secondary | ICD-10-CM | POA: Diagnosis not present

## 2023-01-31 DIAGNOSIS — I700 Atherosclerosis of aorta: Secondary | ICD-10-CM | POA: Diagnosis not present

## 2023-01-31 DIAGNOSIS — Z6841 Body Mass Index (BMI) 40.0 and over, adult: Secondary | ICD-10-CM | POA: Diagnosis not present

## 2023-01-31 DIAGNOSIS — R632 Polyphagia: Secondary | ICD-10-CM | POA: Diagnosis not present

## 2023-01-31 DIAGNOSIS — R7303 Prediabetes: Secondary | ICD-10-CM | POA: Diagnosis not present

## 2023-01-31 DIAGNOSIS — E785 Hyperlipidemia, unspecified: Secondary | ICD-10-CM | POA: Diagnosis not present

## 2023-01-31 DIAGNOSIS — E559 Vitamin D deficiency, unspecified: Secondary | ICD-10-CM | POA: Diagnosis not present

## 2023-02-01 DIAGNOSIS — M545 Low back pain, unspecified: Secondary | ICD-10-CM | POA: Diagnosis not present

## 2023-02-01 DIAGNOSIS — M6281 Muscle weakness (generalized): Secondary | ICD-10-CM | POA: Diagnosis not present

## 2023-02-01 DIAGNOSIS — M5416 Radiculopathy, lumbar region: Secondary | ICD-10-CM | POA: Diagnosis not present

## 2023-02-01 DIAGNOSIS — M79662 Pain in left lower leg: Secondary | ICD-10-CM | POA: Diagnosis not present

## 2023-02-06 DIAGNOSIS — M5416 Radiculopathy, lumbar region: Secondary | ICD-10-CM | POA: Diagnosis not present

## 2023-02-06 DIAGNOSIS — M6281 Muscle weakness (generalized): Secondary | ICD-10-CM | POA: Diagnosis not present

## 2023-02-06 DIAGNOSIS — M545 Low back pain, unspecified: Secondary | ICD-10-CM | POA: Diagnosis not present

## 2023-02-06 DIAGNOSIS — M79662 Pain in left lower leg: Secondary | ICD-10-CM | POA: Diagnosis not present

## 2023-02-15 ENCOUNTER — Ambulatory Visit: Payer: Medicare HMO | Admitting: Physical Therapy

## 2023-02-15 ENCOUNTER — Other Ambulatory Visit: Payer: Self-pay

## 2023-02-15 ENCOUNTER — Encounter: Payer: Self-pay | Admitting: Physical Therapy

## 2023-02-15 DIAGNOSIS — M5416 Radiculopathy, lumbar region: Secondary | ICD-10-CM | POA: Insufficient documentation

## 2023-02-15 DIAGNOSIS — R2689 Other abnormalities of gait and mobility: Secondary | ICD-10-CM | POA: Insufficient documentation

## 2023-02-15 DIAGNOSIS — R252 Cramp and spasm: Secondary | ICD-10-CM | POA: Diagnosis not present

## 2023-02-15 NOTE — Therapy (Signed)
OUTPATIENT PHYSICAL THERAPY THORACOLUMBAR EVALUATION   Patient Name: Robin Christensen MRN: [id number] DOB:Sep 08, 1950, 72 y.o., female Today's Date: 02/15/2023  END OF SESSION:  PT End of Session - 02/15/23 1104     Visit Number 1    Date for PT Re-Evaluation 04/12/23    Authorization Type [employer] MCR    Progress Note Due on Visit 10    PT Start Time 1104    PT Stop Time 1150    PT Time Calculation (min) 46 min    Activity Tolerance Patient tolerated treatment well    Behavior During Therapy WFL for tasks assessed/performed             Past Medical History:  Diagnosis Date   A-fib (HCC)    B12 deficiency    Back pain    Breast cancer (HCC)    Cancer (HCC)    Right Breast Cancer   Chronic headaches    Dysrhythmia    AF   Edema, lower extremity    Gallbladder disease    GERD (gastroesophageal reflux disease)    Hyperlipidemia    Hypertension    Hypothyroidism    Joint pain    Obesity    Pneumonia    PONV (postoperative nausea and vomiting)    Sleep apnea    cpcap   Vitamin deficiency    Past Surgical History:  Procedure Laterality Date   BREAST SURGERY     CARDIOVERSION N/A 06/26/2014   Procedure: CARDIOVERSION;  Surgeon: Pamella Pert, MD;  Location: [facility] ENDOSCOPY;  Service: Cardiovascular;  Laterality: N/A;   CATARACT EXTRACTION     CHOLECYSTECTOMY     DILATION AND CURETTAGE OF UTERUS     eyelid surgery     TOTAL KNEE ARTHROPLASTY Right 05/03/2016   Procedure: TOTAL KNEE ARTHROPLASTY;  Surgeon: Marcene Corning, MD;  Location: [facility] OR;  Service: Orthopedics;  Laterality: Right;   TOTAL KNEE ARTHROPLASTY Left 08/14/2018   Procedure: TOTAL KNEE ARTHROPLASTY;  Surgeon: Marcene Corning, MD;  Location: [facility] OR;  Service: Orthopedics;  Laterality: Left;   Patient Active Problem List   Diagnosis Date Noted   Other fatigue increased 06/01/2020   Atrial fibrillation (HCC) 04/23/2020   Vitamin D deficiency 04/08/2020   Hyperglycemia 03/12/2020   Essential  hypertension 03/12/2020   Mixed hyperlipidemia 01/14/2020   Lower extremity edema 01/14/2020   Depression 01/14/2020   Class 1 obesity with serious comorbidity and body mass index (BMI) of 34.0 to 34.9 in adult 01/14/2020   Primary osteoarthritis of left knee 08/14/2018   Primary localized osteoarthritis of right knee 05/03/2016   Primary osteoarthritis of right knee 05/03/2016   Abnormal auditory perception of both ears 12/01/2015    PCP: Farris Has, MD   REFERRING PROVIDER: Farris Has, MD   REFERRING DIAG: M25.50 (ICD-10-CM) - Pain in unspecified joint   Rationale for Evaluation and Treatment: Rehabilitation  THERAPY DIAG:  Radiculopathy, lumbar region  Cramp and spasm  Other abnormalities of gait and mobility  ONSET DATE: 2020 progressively worsening in past 2-4 mos.  SUBJECTIVE:  SUBJECTIVE STATEMENT: Pain is worsening in the low back and left hip down her leg. Sever pain in L anterior hip x 3-4 months.  Will be seeing pain management also. On Eliquis so can't take much. Does go to [facility] intermittently, but has increased pain if does 30 min. Weakness in L LE with gait causing limp.  PERTINENT HISTORY:  B TKR, AFIB,   PAIN:  Are you having pain? Yes: NPRS scale: 4-5/10 Pain location: low back Pain description: ache sometimes sharp Aggravating factors: walk too fast of too big of steps, lying down Relieving factors: sitting in comfortable chair x 45 min  Are you having pain? Yes: NPRS scale: 6-7/10 Pain location: L and hip to mid shin Pain description: deep pain  Aggravating factors: walking, picking up from floor, dog dishes, lying down Relieving factors: recliner some  PRECAUTIONS: None  RED FLAGS: None   WEIGHT BEARING RESTRICTIONS: No  FALLS:  Has patient fallen in  last 6 months? No  LIVING ENVIRONMENT: Lives with: lives alone Lives in: House/apartment Stairs: Yes: Internal: 15 steps; can reach both Has following equipment at home: Dan Humphreys - 2 wheeled  OCCUPATION: retired  PLOF: Independent with basic ADLs  PATIENT GOALS: help reduce pain in back and leg, and strengthen leg  NEXT MD VISIT: none scheduled  OBJECTIVE:   DIAGNOSTIC FINDINGS:  MRI 12/20/21 IMPRESSION: 1. Mild lumbar spine spondylosis as described above. 2. No acute osseous injury of the lumbar spine.  PATIENT SURVEYS:  Modified Oswestry 31 / 50 = 62.0   COGNITION: Overall cognitive status: Within functional limits for tasks assessed     SENSATION: WFL  MUSCLE LENGTH: NT  POSTURE:  left lateral shift; tight R lumbar  PALPATION: Marked pain in R gluteals and lumbar  LUMBAR ROM:   AROM eval  Flexion Hands to knees  Extension 0  Right lateral flexion 25%  Left lateral flexion About 5 deg  Right rotation 25%  Left rotation 25%   (Blank rows = not tested)  LOWER EXTREMITY MMT:     MMT  Right eval Left eval  Hip flexion 4+ 4  Hip extension 4 4  Hip abduction 4 5  Hip adduction 4+ 4+  Knee flexion 4+ 4+  Knee extension 5 5  Ankle dorsiflexion 5 5   (Blank rows = not tested)  LOWER EXTREMITY ROM:  WFL   LUMBAR SPECIAL TESTS: + hip scour L  FUNCTIONAL TESTS: unable to complete sit to stand without UE support  GAIT: Distance walked: 30 Assistive device utilized: None Level of assistance: Complete Independence Comments: antalgic, decreased stance time L  TODAY'S TREATMENT:                                                                                                                              DATE:   02/15/23 See pt ed and HEP Lateral glide R side to wall 5 sec hold x 10 Lumbar ext at wall x  10 LTR x 2 ea 10 sec hold Pelvic tilt x 5 Bridge x 2  PATIENT EDUCATION:  Education details: PT eval findings, anticipated POC, initial HEP, and sit  <>supine transfer  Person educated: Patient Education method: Explanation, Demonstration, and Handouts Education comprehension: verbalized understanding and returned demonstration  HOME EXERCISE PROGRAM: Access Code: [email or web address] URL: [email or web address] Date: 02/15/2023 Prepared by: Raynelle Fanning  Exercises - Supine Pelvic Tilt  - 1 x daily - 7 x weekly - 2 sets - 10 reps - Supine Lower Trunk Rotation  - 1 x daily - 7 x weekly - 1-2 sets - 5 reps - 10 sec hold - Supine Bridge  - 1 x daily - 7 x weekly - 3 sets - 10 reps - Right Standing Lateral Shift Correction at Wall - Hold  - 2 x daily - 7 x weekly - 1 sets - 10 reps - 5 sec hold - Standing Lumbar Extension at Wall - Forearms  - 2 x daily - 7 x weekly - 2 sets - 10 reps  - Dry Needling info - Aquatic PT - what to expect  ASSESSMENT:  CLINICAL IMPRESSION: Patient is a 72 y.o. female who was seen today for physical therapy evaluation and treatment for low back and left leg pain starting in 2020 but worsening in the last 3-4 months. She has marked deficits in lumbar ROM and demonstrates a left lateral shift and tight  R lumbar. She complains of marked left hip pain anteriorly and is tender in her left psoas muscle. She will benefit from skilled PT to address these deficits.   OBJECTIVE IMPAIRMENTS: Abnormal gait, decreased activity tolerance, decreased mobility, decreased ROM, decreased strength, increased muscle spasms, impaired flexibility, postural dysfunction, and pain.   ACTIVITY LIMITATIONS: carrying, lifting, bending, sitting, standing, sleeping, stairs, bed mobility, toileting, dressing, hygiene/grooming, and locomotion level  PARTICIPATION LIMITATIONS: cleaning, laundry, shopping, community activity, and yard work  PERSONAL FACTORS: Fitness, Time since onset of injury/illness/exacerbation, and 1-2 comorbidities: Afib  are also affecting patient's functional outcome.   REHAB POTENTIAL: Good  CLINICAL DECISION MAKING:  Evolving/moderate complexity  EVALUATION COMPLEXITY: Moderate   GOALS: Goals reviewed with patient? Yes  SHORT TERM GOALS: Target date: 03/15/2023   Patient will be independent with initial HEP.  Baseline:  Goal status: INITIAL  2.  Patient will report centralization of radicular symptoms.  Baseline:  Goal status: INITIAL  3.  No evidence of lateral shift Baseline:  Goal status: INITIAL   LONG TERM GOALS: Target date: 04/12/2023   Patient will be independent with advanced/ongoing HEP to improve outcomes and carryover.  Baseline:  Goal status: INITIAL  2.  Patient will report 75% improvement in low back and leg pain to improve QOL.  Baseline:  Goal status: INITIAL  3.  Patient will demonstrate functional pain free lumbar ROM to perform ADLs.   Baseline:  Goal status: INITIAL  4.  Patient will demonstrate improved hip strength to 5/5 to normalize gait, transfers and bed mobility. Baseline:  Goal status: INITIAL  5.  Patient will report 25/50 on Modified Oswestry to demonstrate improved functional ability.  Baseline: 31 / 50 = 62.0  Goal status: INITIAL   6.  Patient able to return to a walking program without pain provocation Baseline:  Goal status: INITIAL   PLAN:  PT FREQUENCY: 2x/week  PT DURATION: 8 weeks  PLANNED INTERVENTIONS: Therapeutic exercises, Therapeutic activity, Neuromuscular re-education, Balance training, Gait training, Patient/Family education, Self Care, Joint mobilization, Aquatic Therapy, Dry Needling, Electrical stimulation,  Spinal mobilization, Cryotherapy, Moist heat, Taping, Traction, and Manual therapy.  PLAN FOR NEXT SESSION: DN to Left gluteals, lumbar; assess lateral shift, work on lumbar ROM and try to get extension protocol started, psoas release?;   Rudell Ortman, PT 02/15/2023, 12:22 PM

## 2023-02-16 ENCOUNTER — Encounter: Payer: Self-pay | Admitting: Physical Therapy

## 2023-02-16 ENCOUNTER — Ambulatory Visit: Payer: Medicare HMO | Attending: Family Medicine | Admitting: Physical Therapy

## 2023-02-16 DIAGNOSIS — M62838 Other muscle spasm: Secondary | ICD-10-CM | POA: Diagnosis not present

## 2023-02-16 DIAGNOSIS — R252 Cramp and spasm: Secondary | ICD-10-CM | POA: Diagnosis not present

## 2023-02-16 DIAGNOSIS — M25651 Stiffness of right hip, not elsewhere classified: Secondary | ICD-10-CM | POA: Insufficient documentation

## 2023-02-16 DIAGNOSIS — G8929 Other chronic pain: Secondary | ICD-10-CM | POA: Insufficient documentation

## 2023-02-16 DIAGNOSIS — M5416 Radiculopathy, lumbar region: Secondary | ICD-10-CM

## 2023-02-16 DIAGNOSIS — M25551 Pain in right hip: Secondary | ICD-10-CM | POA: Diagnosis not present

## 2023-02-16 DIAGNOSIS — R2689 Other abnormalities of gait and mobility: Secondary | ICD-10-CM | POA: Diagnosis not present

## 2023-02-16 DIAGNOSIS — M6281 Muscle weakness (generalized): Secondary | ICD-10-CM

## 2023-02-16 DIAGNOSIS — M5441 Lumbago with sciatica, right side: Secondary | ICD-10-CM | POA: Insufficient documentation

## 2023-02-16 NOTE — Therapy (Signed)
OUTPATIENT PHYSICAL THERAPY THORACOLUMBAR EVALUATION   Patient Name: CANDLE LATTERELL MRN: [id number] DOB:Apr 26, 1951, 72 y.o., female Today's Date: 02/16/2023  END OF SESSION:  PT End of Session - 02/16/23 1401     Visit Number 2    Date for PT Re-Evaluation 04/12/23    Authorization Type [employer] MCR    Progress Note Due on Visit 10    PT Start Time 1401    PT Stop Time 1445    PT Time Calculation (min) 44 min    Activity Tolerance Patient tolerated treatment well    Behavior During Therapy WFL for tasks assessed/performed              Past Medical History:  Diagnosis Date   A-fib (HCC)    B12 deficiency    Back pain    Breast cancer (HCC)    Cancer (HCC)    Right Breast Cancer   Chronic headaches    Dysrhythmia    AF   Edema, lower extremity    Gallbladder disease    GERD (gastroesophageal reflux disease)    Hyperlipidemia    Hypertension    Hypothyroidism    Joint pain    Obesity    Pneumonia    PONV (postoperative nausea and vomiting)    Sleep apnea    cpcap   Vitamin deficiency    Past Surgical History:  Procedure Laterality Date   BREAST SURGERY     CARDIOVERSION N/A 06/26/2014   Procedure: CARDIOVERSION;  Surgeon: Pamella Pert, MD;  Location: [facility] ENDOSCOPY;  Service: Cardiovascular;  Laterality: N/A;   CATARACT EXTRACTION     CHOLECYSTECTOMY     DILATION AND CURETTAGE OF UTERUS     eyelid surgery     TOTAL KNEE ARTHROPLASTY Right 05/03/2016   Procedure: TOTAL KNEE ARTHROPLASTY;  Surgeon: Marcene Corning, MD;  Location: [facility] OR;  Service: Orthopedics;  Laterality: Right;   TOTAL KNEE ARTHROPLASTY Left 08/14/2018   Procedure: TOTAL KNEE ARTHROPLASTY;  Surgeon: Marcene Corning, MD;  Location: [facility] OR;  Service: Orthopedics;  Laterality: Left;   Patient Active Problem List   Diagnosis Date Noted   Other fatigue increased 06/01/2020   Atrial fibrillation (HCC) 04/23/2020   Vitamin D deficiency 04/08/2020   Hyperglycemia 03/12/2020   Essential  hypertension 03/12/2020   Mixed hyperlipidemia 01/14/2020   Lower extremity edema 01/14/2020   Depression 01/14/2020   Class 1 obesity with serious comorbidity and body mass index (BMI) of 34.0 to 34.9 in adult 01/14/2020   Primary osteoarthritis of left knee 08/14/2018   Primary localized osteoarthritis of right knee 05/03/2016   Primary osteoarthritis of right knee 05/03/2016   Abnormal auditory perception of both ears 12/01/2015    PCP: Farris Has, MD   REFERRING PROVIDER: Farris Has, MD   REFERRING DIAG: M25.50 (ICD-10-CM) - Pain in unspecified joint   Rationale for Evaluation and Treatment: Rehabilitation  THERAPY DIAG:  Radiculopathy, lumbar region  Cramp and spasm  Other abnormalities of gait and mobility  Muscle weakness (generalized)  ONSET DATE: 2020 progressively worsening in past 2-4 mos.  SUBJECTIVE:  SUBJECTIVE STATEMENT: Pt states groin pain in left hip is less. Back is hurting more than the hip today.   PERTINENT HISTORY:  B TKR, AFIB,  From eval: Pain is worsening in the low back and left hip down her leg. Sever pain in L anterior hip x 3-4 months.  Will be seeing pain management also. On Eliquis so can't take much. Does go to [facility] intermittently, but has increased pain if does 30 min. Weakness in L LE with gait causing limp.  PAIN:  Are you having pain? Yes: NPRS scale: 4-5/10 Pain location: low back Pain description: ache sometimes sharp Aggravating factors: walk too fast of too big of steps, lying down Relieving factors: sitting in comfortable chair x 45 min  Are you having pain? Yes: NPRS scale: 4-5/10 Pain location: L and hip to mid shin Pain description: deep pain  Aggravating factors: walking, picking up from floor, dog dishes, lying down Relieving  factors: recliner some  PRECAUTIONS: None  RED FLAGS: None   WEIGHT BEARING RESTRICTIONS: No  FALLS:  Has patient fallen in last 6 months? No  LIVING ENVIRONMENT: Lives with: lives alone Lives in: House/apartment Stairs: Yes: Internal: 15 steps; can reach both Has following equipment at home: Dan Humphreys - 2 wheeled  OCCUPATION: retired  PLOF: Independent with basic ADLs  PATIENT GOALS: help reduce pain in back and leg, and strengthen leg  NEXT MD VISIT: none scheduled  OBJECTIVE:   DIAGNOSTIC FINDINGS:  MRI 12/20/21 IMPRESSION: 1. Mild lumbar spine spondylosis as described above. 2. No acute osseous injury of the lumbar spine.  PATIENT SURVEYS:  Modified Oswestry 31 / 50 = 62.0   COGNITION: Overall cognitive status: Within functional limits for tasks assessed     SENSATION: WFL  MUSCLE LENGTH: NT  POSTURE:  left lateral shift; tight R lumbar  PALPATION: Marked pain in R gluteals and lumbar  LUMBAR ROM:   AROM eval  Flexion Hands to knees  Extension 0  Right lateral flexion 25%  Left lateral flexion About 5 deg  Right rotation 25%  Left rotation 25%   (Blank rows = not tested)  LOWER EXTREMITY MMT:     MMT  Right eval Left eval  Hip flexion 4+ 4  Hip extension 4 4  Hip abduction 4 5  Hip adduction 4+ 4+  Knee flexion 4+ 4+  Knee extension 5 5  Ankle dorsiflexion 5 5   (Blank rows = not tested)  LOWER EXTREMITY ROM:  WFL   LUMBAR SPECIAL TESTS: + hip scour L  FUNCTIONAL TESTS: unable to complete sit to stand without UE support  GAIT: Distance walked: 30 Assistive device utilized: None Level of assistance: Complete Independence Comments: antalgic, decreased stance time L  [facility] Adult PT Treatment:                                                DATE: 02/16/23 Therapeutic Exercise: Seated pball flexion x30 sec, with lateral flexion x30 sec Supine hip flexor stretch x 30 sec Supine PPT + heel slide x10 (arm above head to stretch L anterior  trunk/hip) Supine PPT + quad set x10 Sidelying QL stretch x30 sec Sidelying TFL stretch x 30 sec Sidelying clamshell 2x10 Supine bridge 2x10 LTR 5x10 sec Seated adductor stretch x 30 sec Lateral shift into wall 2x30 sec Lumbar extension against wall 2x30 sec Standing  hip extension 2x10 Manual Therapy: STM & TPR L hip flexor, TSA, iliopsoas   02/15/23 See pt ed and HEP Lateral glide R side to wall 5 sec hold x 10 Lumbar ext at wall x 10 LTR x 2 ea 10 sec hold Pelvic tilt x 5 Bridge x 2  PATIENT EDUCATION:  Education details: PT eval findings, anticipated POC, initial HEP, and sit <>supine transfer  Person educated: Patient Education method: Explanation, Demonstration, and Handouts Education comprehension: verbalized understanding and returned demonstration  HOME EXERCISE PROGRAM: Access Code: [email or web address] URL: [email or web address] Date: 02/15/2023 Prepared by: Raynelle Fanning  Exercises - Supine Pelvic Tilt  - 1 x daily - 7 x weekly - 2 sets - 10 reps - Supine Lower Trunk Rotation  - 1 x daily - 7 x weekly - 1-2 sets - 5 reps - 10 sec hold - Supine Bridge  - 1 x daily - 7 x weekly - 3 sets - 10 reps - Right Standing Lateral Shift Correction at Wall - Hold  - 2 x daily - 7 x weekly - 1 sets - 10 reps - 5 sec hold - Standing Lumbar Extension at Wall - Forearms  - 2 x daily - 7 x weekly - 2 sets - 10 reps  - Dry Needling info - Aquatic PT - what to expect  ASSESSMENT:  CLINICAL IMPRESSION: Treatment focused on psoas/hip flexor extensibility/stretching. Worked on core and hip extension strengthening. Deferred TPDN today as pt remains highly sensitive/tender even to gentle palpation. Discussed performing it next session after pt continues to do self mobilization/massage at home.    GOALS: Goals reviewed with patient? Yes  SHORT TERM GOALS: Target date: 03/15/2023   Patient will be independent with initial HEP.  Baseline:  Goal status: INITIAL  2.  Patient will  report centralization of radicular symptoms.  Baseline:  Goal status: INITIAL  3.  No evidence of lateral shift Baseline:  Goal status: INITIAL   LONG TERM GOALS: Target date: 04/12/2023   Patient will be independent with advanced/ongoing HEP to improve outcomes and carryover.  Baseline:  Goal status: INITIAL  2.  Patient will report 75% improvement in low back and leg pain to improve QOL.  Baseline:  Goal status: INITIAL  3.  Patient will demonstrate functional pain free lumbar ROM to perform ADLs.   Baseline:  Goal status: INITIAL  4.  Patient will demonstrate improved hip strength to 5/5 to normalize gait, transfers and bed mobility. Baseline:  Goal status: INITIAL  5.  Patient will report 25/50 on Modified Oswestry to demonstrate improved functional ability.  Baseline: 31 / 50 = 62.0  Goal status: INITIAL   6.  Patient able to return to a walking program without pain provocation Baseline:  Goal status: INITIAL   PLAN:  PT FREQUENCY: 2x/week  PT DURATION: 8 weeks  PLANNED INTERVENTIONS: Therapeutic exercises, Therapeutic activity, Neuromuscular re-education, Balance training, Gait training, Patient/Family education, Self Care, Joint mobilization, Aquatic Therapy, Dry Needling, Electrical stimulation, Spinal mobilization, Cryotherapy, Moist heat, Taping, Traction, and Manual therapy.  PLAN FOR NEXT SESSION: DN to Left gluteals, lumbar, iliopsoas/hip flexors; assess lateral shift, work on lumbar ROM and try to get extension protocol started  [facility] April Ma L Yarimar Lavis, PT 02/16/2023, 2:03 PM

## 2023-02-21 ENCOUNTER — Ambulatory Visit: Payer: Medicare HMO | Admitting: Rehabilitative and Restorative Service Providers"

## 2023-02-21 ENCOUNTER — Encounter: Payer: Self-pay | Admitting: Rehabilitative and Restorative Service Providers"

## 2023-02-21 DIAGNOSIS — R252 Cramp and spasm: Secondary | ICD-10-CM

## 2023-02-21 DIAGNOSIS — G8929 Other chronic pain: Secondary | ICD-10-CM

## 2023-02-21 DIAGNOSIS — M5416 Radiculopathy, lumbar region: Secondary | ICD-10-CM | POA: Diagnosis not present

## 2023-02-21 DIAGNOSIS — M62838 Other muscle spasm: Secondary | ICD-10-CM

## 2023-02-21 DIAGNOSIS — M25651 Stiffness of right hip, not elsewhere classified: Secondary | ICD-10-CM | POA: Diagnosis not present

## 2023-02-21 DIAGNOSIS — M6281 Muscle weakness (generalized): Secondary | ICD-10-CM | POA: Diagnosis not present

## 2023-02-21 DIAGNOSIS — M25551 Pain in right hip: Secondary | ICD-10-CM

## 2023-02-21 DIAGNOSIS — M5441 Lumbago with sciatica, right side: Secondary | ICD-10-CM | POA: Diagnosis not present

## 2023-02-21 DIAGNOSIS — R2689 Other abnormalities of gait and mobility: Secondary | ICD-10-CM | POA: Diagnosis not present

## 2023-02-21 NOTE — Therapy (Signed)
OUTPATIENT PHYSICAL THERAPY THORACOLUMBAR EVALUATION   Patient Name: Robin Christensen MRN: [id number] DOB:1950/10/22, 72 y.o., female Today's Date: 02/21/2023  END OF SESSION:  PT End of Session - 02/21/23 0759     Visit Number 3    Date for PT Re-Evaluation 04/12/23    Authorization Type HU[employer] MCR    Progress Note Due on Visit 10    PT Start Time 0759    PT Stop Time 0845    PT Time Calculation (min) 46 min    Activity Tolerance Patient tolerated treatment well              Past Medical History:  Diagnosis Date   A-fib (HCC)    B12 deficiency    Back pain    Breast cancer (HCC)    Cancer (HCC)    Right Breast Cancer   Chronic headaches    Dysrhythmia    AF   Edema, lower extremity    Gallbladder disease    GERD (gastroesophageal reflux disease)    Hyperlipidemia    Hypertension    Hypothyroidism    Joint pain    Obesity    Pneumonia    PONV (postoperative nausea and vomiting)    Sleep apnea    cpcap   Vitamin deficiency    Past Surgical History:  Procedure Laterality Date   BREAST SURGERY     CARDIOVERSION N/A 06/26/2014   Procedure: CARDIOVERSION;  Surgeon: Pamella Pert, MD;  Location: [facility] ENDOSCOPY;  Service: Cardiovascular;  Laterality: N/A;   CATARACT EXTRACTION     CHOLECYSTECTOMY     DILATION AND CURETTAGE OF UTERUS     eyelid surgery     TOTAL KNEE ARTHROPLASTY Right 05/03/2016   Procedure: TOTAL KNEE ARTHROPLASTY;  Surgeon: Marcene Corning, MD;  Location: [facility] OR;  Service: Orthopedics;  Laterality: Right;   TOTAL KNEE ARTHROPLASTY Left 08/14/2018   Procedure: TOTAL KNEE ARTHROPLASTY;  Surgeon: Marcene Corning, MD;  Location: [facility] OR;  Service: Orthopedics;  Laterality: Left;   Patient Active Problem List   Diagnosis Date Noted   Other fatigue increased 06/01/2020   Atrial fibrillation (HCC) 04/23/2020   Vitamin D deficiency 04/08/2020   Hyperglycemia 03/12/2020   Essential hypertension 03/12/2020   Mixed hyperlipidemia 01/14/2020    Lower extremity edema 01/14/2020   Depression 01/14/2020   Class 1 obesity with serious comorbidity and body mass index (BMI) of 34.0 to 34.9 in adult 01/14/2020   Primary osteoarthritis of left knee 08/14/2018   Primary localized osteoarthritis of right knee 05/03/2016   Primary osteoarthritis of right knee 05/03/2016   Abnormal auditory perception of both ears 12/01/2015    PCP: Farris Has, MD   REFERRING PROVIDER: Farris Has, MD   REFERRING DIAG: M25.50 (ICD-10-CM) - Pain in unspecified joint   Rationale for Evaluation and Treatment: Rehabilitation  THERAPY DIAG:  Radiculopathy, lumbar region  Cramp and spasm  Other abnormalities of gait and mobility  Muscle weakness (generalized)  Chronic right-sided low back pain with right-sided sciatica  Pain in right hip  Stiffness of right hip, not elsewhere classified  Other muscle spasm  ONSET DATE: 2020 progressively worsening in past 2-4 mos.  SUBJECTIVE:  SUBJECTIVE STATEMENT: Mornings are the worst because night times are so bad. She has been doing more since last week - she has been to the grocery store and doing a little housework. She has not done exercises in the past two days because she has been so busy. She has used the ball on the wall. She sits in a recliner at home - her favorite chair. Pain is still in the L groin pain. Less back pain today.   PERTINENT HISTORY:  B TKR, AFIB,  From eval: Pain is worsening in the low back and left hip down her leg. Sever pain in L anterior hip x 3-4 months.  Will be seeing pain management also. On Eliquis so can't take much. Does go to [facility] intermittently, but has increased pain if does 30 min. Weakness in L LE with gait causing limp.  PAIN:  Are you having pain? Yes: NPRS scale: 0/10 Pain  location: low back Pain description: ache sometimes sharp Aggravating factors: walk too fast of too big of steps, lying down Relieving factors: sitting in comfortable chair x 45 min  Are you having pain? Yes: NPRS scale: 5-6/10 Pain location: L and hip to mid shin Pain description: deep pain  Aggravating factors: walking, picking up from floor, dog dishes, lying down Relieving factors: recliner some  PRECAUTIONS: None   WEIGHT BEARING RESTRICTIONS: No  FALLS:  Has patient fallen in last 6 months? 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 Mild lumbar spine spondylosis as described above. 2. No acute osseous injury of the lumbar spine.  PATIENT SURVEYS:  Modified Oswestry 31 / 50 = 62.0   MUSCLE LENGTH: NT  POSTURE:  left lateral shift; tight R lumbar  PALPATION: Marked pain in R gluteals and lumbar  LUMBAR ROM:   AROM eval  Flexion Hands to knees  Extension 0  Right lateral flexion 25%  Left lateral flexion About 5 deg  Right rotation 25%  Left rotation 25%   (Blank rows = not tested)  LOWER EXTREMITY MMT:     MMT  Right eval Left eval  Hip flexion 4+ 4  Hip extension 4 4  Hip abduction 4 5  Hip adduction 4+ 4+  Knee flexion 4+ 4+  Knee extension 5 5  Ankle dorsiflexion 5 5   (Blank rows = not tested)  LOWER EXTREMITY ROM:  WFL   LUMBAR SPECIAL TESTS: + hip scour L  FUNCTIONAL TESTS: unable to complete sit to stand without UE support  GAIT: Distance walked: 30 Assistive device utilized: None Level of assistance: Complete  Independence Comments: antalgic, decreased stance time L  [facility] Adult PT Treatment:                                                DATE: 02/21/23 Therapeutic Exercise: UBE L4 x 4 min alt fwd/back  Standing ball release work L hip area Sitting hip flexor stretch  L 30 sec x 3 ed  Standing hip extension facing wall R/L x 10  Standing back to wall tighten core and push hips away from wall 3 sec x 10  Supine core 10 sec x 10  Supine hip flexor stretch x 30 sec Supine bridging with core tight (minimal lift) 3 sec x 10  Supine hip flexor stretch 30 sec x 2  Walking with core engaged x 120 ft Backward walking with SBA x 10 feet (unsteady Manual Therapy: STM & TPR L hip flexor, TSA, iliopsoas Trigger Point Dry-Needling  Treatment instructions: Expect mild to moderate muscle soreness. S/S of pneumothorax if dry needled over a lung field, and to seek immediate medical attention should they occur. Patient verbalized understanding of these instructions and education.  Patient Consent Given: Yes Education handout provided: Previously provided Muscles treated: L psoas; hip flexors/quad; TFL; lateral hip pt supine   Electrical stimulation performed: No Parameters: N/A Treatment response/outcome: decreased palpable tightness   Self care: Sitting with noodle and/or pillow along spine to improve sitting posture and alignment and decrease hip flexion  Suggestions for sleeping positions   [facility] Adult PT Treatment:                                                DATE: 02/16/23 Therapeutic Exercise: Seated pball flexion x30 sec, with lateral flexion x30 sec Supine hip flexor stretch x 30 sec Supine PPT + heel slide x10 (arm above head to stretch L anterior trunk/hip) Supine PPT + quad set x10 Sidelying QL stretch x30 sec Sidelying TFL stretch x 30 sec Sidelying clamshell 2x10 Supine bridge 2x10 LTR 5x10 sec Seated adductor stretch x 30 sec Lateral shift into wall 2x30 sec Lumbar extension against wall  2x30 sec Standing hip extension 2x10 )  Manual Therapy: STM & TPR L hip flexor, TSA, iliopsoas   PATIENT EDUCATION:  Education details: PT eval findings, anticipated POC, initial HEP, and sit <>supine transfer  Person educated: Patient Education method: Explanation, Demonstration, and Handouts Education comprehension: verbalized understanding and returned demonstration  HOME EXERCISE PROGRAM: Access Code: [email or web address] URL: [email or web address] Date: 02/15/2023 Prepared by: Raynelle Fanning  Exercises - Supine Pelvic Tilt  - 1 x daily - 7 x weekly - 2 sets - 10 reps - Supine Lower Trunk Rotation  - 1 x daily - 7 x weekly - 1-2 sets - 5 reps - 10 sec hold - Supine Bridge  - 1 x daily - 7 x weekly - 3 sets - 10 reps - Right Standing Lateral Shift Correction at Wall - Hold  - 2 x daily - 7 x weekly - 1 sets - 10 reps - 5 sec hold - Standing Lumbar Extension at Wall - Forearms  - 2 x daily - 7 x weekly - 2 sets - 10 reps  - Dry Needling info - Aquatic PT - what to expect  ASSESSMENT:  CLINICAL IMPRESSION: Treatment focused on psoas/hip flexor extensibility/stretching; activation of hip extensors;  introducing gentle stretches in standing. Worked on core and hip extension strengthening. Trial of TPDN today through L psoas area. Discussed importance in consistent exercises and self mobilization/massage at home.    GOALS: Goals reviewed with patient? Yes  SHORT TERM GOALS: Target date: 03/15/2023   Patient will be independent with initial HEP.  Baseline:  Goal status: INITIAL  2.  Patient will report  centralization of radicular symptoms.  Baseline:  Goal status: INITIAL  3.  No evidence of lateral shift Baseline:  Goal status: INITIAL   LONG TERM GOALS: Target date: 04/12/2023   Patient will be independent with advanced/ongoing HEP to improve outcomes and carryover.  Baseline:  Goal status: INITIAL  2.  Patient will report 75% improvement in low back and leg pain to  improve QOL.  Baseline:  Goal status: INITIAL  3.  Patient will demonstrate functional pain free lumbar ROM to perform ADLs.   Baseline:  Goal status: INITIAL  4.  Patient will demonstrate improved hip strength to 5/5 to normalize gait, transfers and bed mobility. Baseline:  Goal status: INITIAL  5.  Patient will report 25/50 on Modified Oswestry to demonstrate improved functional ability.  Baseline: 31 / 50 = 62.0  Goal status: INITIAL   6.  Patient able to return to a walking program without pain provocation Baseline:  Goal status: INITIAL   PLAN:  PT FREQUENCY: 2x/week  PT DURATION: 8 weeks  PLANNED INTERVENTIONS: Therapeutic exercises, Therapeutic activity, Neuromuscular re-education, Balance training, Gait training, Patient/Family education, Self Care, Joint mobilization, Aquatic Therapy, Dry Needling, Electrical stimulation, Spinal mobilization, Cryotherapy, Moist heat, Taping, Traction, and Manual therapy.  PLAN FOR NEXT SESSION: DN to Left gluteals, lumbar, iliopsoas/hip flexors; assess lateral shift, work on lumbar ROM and try to get extension protocol started  [employer], PT 02/21/2023, 8:01 AM

## 2023-02-23 ENCOUNTER — Ambulatory Visit: Payer: Medicare HMO | Admitting: Physical Therapy

## 2023-02-28 ENCOUNTER — Encounter: Payer: Self-pay | Admitting: Physical Therapy

## 2023-02-28 ENCOUNTER — Ambulatory Visit: Payer: Medicare HMO | Admitting: Physical Therapy

## 2023-02-28 DIAGNOSIS — M5416 Radiculopathy, lumbar region: Secondary | ICD-10-CM | POA: Diagnosis not present

## 2023-02-28 DIAGNOSIS — M5441 Lumbago with sciatica, right side: Secondary | ICD-10-CM | POA: Diagnosis not present

## 2023-02-28 DIAGNOSIS — M25651 Stiffness of right hip, not elsewhere classified: Secondary | ICD-10-CM

## 2023-02-28 DIAGNOSIS — M6281 Muscle weakness (generalized): Secondary | ICD-10-CM

## 2023-02-28 DIAGNOSIS — M25551 Pain in right hip: Secondary | ICD-10-CM | POA: Diagnosis not present

## 2023-02-28 DIAGNOSIS — R2689 Other abnormalities of gait and mobility: Secondary | ICD-10-CM

## 2023-02-28 DIAGNOSIS — M62838 Other muscle spasm: Secondary | ICD-10-CM | POA: Diagnosis not present

## 2023-02-28 DIAGNOSIS — R252 Cramp and spasm: Secondary | ICD-10-CM

## 2023-02-28 DIAGNOSIS — G8929 Other chronic pain: Secondary | ICD-10-CM

## 2023-02-28 NOTE — Therapy (Signed)
OUTPATIENT PHYSICAL THERAPY THORACOLUMBAR TREATMENT   Patient Name: Robin Christensen MRN: [id number] DOB:1951/03/13, 72 y.o., female Today's Date: 02/28/2023  END OF SESSION:  PT End of Session - 02/28/23 1402     Visit Number 4    Date for PT Re-Evaluation 04/12/23    Authorization Type [employer] MCR    Progress Note Due on Visit 10    PT Start Time 1400    PT Stop Time 1440    PT Time Calculation (min) 40 min    Activity Tolerance Patient tolerated treatment well              Past Medical History:  Diagnosis Date   A-fib (HCC)    B12 deficiency    Back pain    Breast cancer (HCC)    Cancer (HCC)    Right Breast Cancer   Chronic headaches    Dysrhythmia    AF   Edema, lower extremity    Gallbladder disease    GERD (gastroesophageal reflux disease)    Hyperlipidemia    Hypertension    Hypothyroidism    Joint pain    Obesity    Pneumonia    PONV (postoperative nausea and vomiting)    Sleep apnea    cpcap   Vitamin deficiency    Past Surgical History:  Procedure Laterality Date   BREAST SURGERY     CARDIOVERSION N/A 06/26/2014   Procedure: CARDIOVERSION;  Surgeon: Pamella Pert, MD;  Location: [facility] ENDOSCOPY;  Service: Cardiovascular;  Laterality: N/A;   CATARACT EXTRACTION     CHOLECYSTECTOMY     DILATION AND CURETTAGE OF UTERUS     eyelid surgery     TOTAL KNEE ARTHROPLASTY Right 05/03/2016   Procedure: TOTAL KNEE ARTHROPLASTY;  Surgeon: Marcene Corning, MD;  Location: [facility] OR;  Service: Orthopedics;  Laterality: Right;   TOTAL KNEE ARTHROPLASTY Left 08/14/2018   Procedure: TOTAL KNEE ARTHROPLASTY;  Surgeon: Marcene Corning, MD;  Location: [facility] OR;  Service: Orthopedics;  Laterality: Left;   Patient Active Problem List   Diagnosis Date Noted   Other fatigue increased 06/01/2020   Atrial fibrillation (HCC) 04/23/2020   Vitamin D deficiency 04/08/2020   Hyperglycemia 03/12/2020   Essential hypertension 03/12/2020   Mixed hyperlipidemia 01/14/2020    Lower extremity edema 01/14/2020   Depression 01/14/2020   Class 1 obesity with serious comorbidity and body mass index (BMI) of 34.0 to 34.9 in adult 01/14/2020   Primary osteoarthritis of left knee 08/14/2018   Primary localized osteoarthritis of right knee 05/03/2016   Primary osteoarthritis of right knee 05/03/2016   Abnormal auditory perception of both ears 12/01/2015    PCP: Farris Has, MD   REFERRING PROVIDER: Farris Has, MD   REFERRING DIAG: M25.50 (ICD-10-CM) - Pain in unspecified joint   Rationale for Evaluation and Treatment: Rehabilitation  THERAPY DIAG:  Radiculopathy, lumbar region  Cramp and spasm  Other abnormalities of gait and mobility  Muscle weakness (generalized)  Chronic right-sided low back pain with right-sided sciatica  Pain in right hip  Stiffness of right hip, not elsewhere classified  ONSET DATE: 2020 progressively worsening in past 2-4 mos.  SUBJECTIVE:  SUBJECTIVE STATEMENT: Pt states she had some good days after last session of TPDN and so thinks she may have done too much.   PERTINENT HISTORY:  B TKR, AFIB,  From eval: Pain is worsening in the low back and left hip down her leg. Sever pain in L anterior hip x 3-4 months.  Will be seeing pain management also. On Eliquis so can't take much. Does go to [facility] intermittently, but has increased pain if does 30 min. Weakness in L LE with gait causing limp.  PAIN:  Are you having pain? Yes: NPRS scale: 0/10 Pain location: low back Pain description: ache sometimes sharp Aggravating factors: walk too fast of too big of steps, lying down Relieving factors: sitting in comfortable chair x 45 min  Are you having pain? Yes: NPRS scale: 5-6/10 Pain location: L and hip to mid shin Pain description: deep pain   Aggravating factors: walking, picking up from floor, dog dishes, lying down Relieving factors: recliner some  PRECAUTIONS: None   WEIGHT BEARING RESTRICTIONS: No  FALLS:  Has patient fallen in last 6 months? 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 Mild lumbar spine spondylosis as described above. 2. No acute osseous injury of the lumbar spine.  PATIENT SURVEYS:  Modified Oswestry 31 / 50 = 62.0   MUSCLE LENGTH: NT  POSTURE:  left lateral shift; tight R lumbar  PALPATION: Marked pain in R gluteals and lumbar  LUMBAR ROM:   AROM eval  Flexion Hands to knees  Extension 0  Right lateral flexion 25%  Left lateral flexion About 5 deg  Right rotation 25%  Left rotation 25%   (Blank rows = not tested)  LOWER EXTREMITY MMT:     MMT  Right eval Left eval  Hip flexion 4+ 4  Hip extension 4 4  Hip abduction 4 5  Hip adduction 4+ 4+  Knee flexion 4+ 4+  Knee extension 5 5  Ankle dorsiflexion 5 5   (Blank rows = not tested)  LOWER EXTREMITY ROM:  WFL   LUMBAR SPECIAL TESTS: + hip scour L  FUNCTIONAL TESTS: unable to complete sit to stand without UE support  GAIT: Distance walked: 30 Assistive device utilized: None Level of assistance: Complete Independence Comments: antalgic, decreased stance time L  [facility] Adult PT Treatment:                                                DATE: 02/28/23 Therapeutic Exercise: Nustep L5 x 5 min UEs/LEs Sidelying clamshell 3x10 Sidelying hip ext iso against wall 15x3 sec Sidelying TFL/hip flexor stretch 2x  30 sec Supine hip flexor stretch x30 sec Standing hip ext Manual Therapy: STM & TPR glute max/med, piriformis Skilled assessment and palpation for TPDN Trigger Point Dry-Needling  Treatment instructions: Expect mild to moderate muscle soreness. S/S of pneumothorax if dry needled over a lung field, and to seek immediate medical attention should  they occur. Patient verbalized understanding of these instructions and education.  Patient Consent Given: Yes Education handout provided: Previously provided Muscles treated: L glute med/max, psoas  Electrical stimulation performed: No Parameters: N/A Treatment response/outcome: Twitch response, decrease in muscle tension   [facility] Adult PT Treatment:                                                DATE: 02/21/23 Therapeutic Exercise: UBE L4 x 4 min alt fwd/back  Standing ball release work L hip area Sitting hip flexor stretch L 30 sec x 3 ed  Standing hip extension facing wall R/L x 10  Standing back to wall tighten core and push hips away from wall 3 sec x 10  Supine core 10 sec x 10  Supine hip flexor stretch x 30 sec Supine bridging with core tight (minimal lift) 3 sec x 10  Supine hip flexor stretch 30 sec x 2  Walking with core engaged x 120 ft Backward walking with SBA x 10 feet (unsteady Manual Therapy: STM & TPR L hip flexor, TSA, iliopsoas Trigger Point Dry-Needling  Treatment instructions: Expect mild to moderate muscle soreness. S/S of pneumothorax if dry needled over a lung field, and to seek immediate medical attention should they occur. Patient verbalized understanding of these instructions and education.  Patient Consent Given: Yes Education handout provided: Previously provided Muscles treated: L psoas; hip flexors/quad; TFL; lateral hip pt supine   Electrical stimulation performed: No Parameters: N/A Treatment response/outcome: decreased palpable tightness   Self care: Sitting with noodle and/or pillow along spine to  improve sitting posture and alignment and decrease hip flexion  Suggestions for sleeping positions   [facility] Adult PT Treatment:                                                DATE: 02/16/23 Therapeutic Exercise: Seated pball flexion x30 sec, with lateral flexion x30 sec Supine hip flexor stretch x 30 sec Supine PPT + heel slide x10 (arm above head to stretch L anterior trunk/hip) Supine PPT + quad set x10 Sidelying QL stretch x30 sec Sidelying TFL stretch x 30 sec Sidelying clamshell 2x10 Supine bridge 2x10 LTR 5x10 sec Seated adductor stretch x 30 sec Lateral shift into wall 2x30 sec Lumbar extension against wall 2x30 sec Standing hip extension 2x10 )  Manual Therapy: STM & TPR L hip flexor, TSA, iliopsoas   PATIENT EDUCATION:  Education details: Discussed HEP modifications, continuing self massage/myofacial release, TPDN with e-stim Person educated: Patient Education method: Explanation, Demonstration, and Handouts Education comprehension: verbalized understanding and returned demonstration  HOME EXERCISE PROGRAM: Access Code: [email or web address] URL: [email or web address] Date: 02/15/2023 Prepared by: Raynelle Fanning  Exercises - Supine Pelvic Tilt  - 1 x daily - 7 x weekly - 2 sets - 10 reps - Supine Lower Trunk Rotation  - 1 x daily - 7 x weekly - 1-2 sets - 5 reps - 10 sec hold - Supine Bridge  - 1 x daily - 7 x weekly - 3 sets - 10 reps - Right Standing Lateral Shift Correction at Wall - Hold  - 2 x daily - 7 x weekly - 1 sets - 10 reps - 5 sec hold - Standing Lumbar  Extension at [employer] - Forearms  - 2 x daily - 7 x weekly - 2 sets - 10 reps  - Dry Needling info - Aquatic PT - what to expect  ASSESSMENT:  CLINICAL IMPRESSION: Continued TPDN as pt found this helpful in decreasing her pain. Performed more in L posterior hip and psoas today. Working on glute activation and strengthening. Discussed performing trial of TPDN with e-stim.    GOALS: Goals reviewed with patient?  Yes  SHORT TERM GOALS: Target date: 03/15/2023   Patient will be independent with initial HEP.  Baseline:  Goal status: INITIAL  2.  Patient will report centralization of radicular symptoms.  Baseline:  Goal status: INITIAL  3.  No evidence of lateral shift Baseline:  Goal status: INITIAL   LONG TERM GOALS: Target date: 04/12/2023   Patient will be independent with advanced/ongoing HEP to improve outcomes and carryover.  Baseline:  Goal status: INITIAL  2.  Patient will report 75% improvement in low back and leg pain to improve QOL.  Baseline:  Goal status: INITIAL  3.  Patient will demonstrate functional pain free lumbar ROM to perform ADLs.   Baseline:  Goal status: INITIAL  4.  Patient will demonstrate improved hip strength to 5/5 to normalize gait, transfers and bed mobility. Baseline:  Goal status: INITIAL  5.  Patient will report 25/50 on Modified Oswestry to demonstrate improved functional ability.  Baseline: 31 / 50 = 62.0  Goal status: INITIAL   6.  Patient able to return to a walking program without pain provocation Baseline:  Goal status: INITIAL   PLAN:  PT FREQUENCY: 2x/week  PT DURATION: 8 weeks  PLANNED INTERVENTIONS: Therapeutic exercises, Therapeutic activity, Neuromuscular re-education, Balance training, Gait training, Patient/Family education, Self Care, Joint mobilization, Aquatic Therapy, Dry Needling, Electrical stimulation, Spinal mobilization, Cryotherapy, Moist heat, Taping, Traction, and Manual therapy.  PLAN FOR NEXT SESSION: DN to Left gluteals, lumbar, iliopsoas/hip flexors; assess lateral shift, work on lumbar ROM and try to get extension protocol started. Work on core and hip strengthening.   April Ma L , PT 02/28/2023, 2:03 PM

## 2023-03-02 ENCOUNTER — Ambulatory Visit: Payer: Medicare HMO | Admitting: Physical Therapy

## 2023-03-02 ENCOUNTER — Encounter: Payer: Self-pay | Admitting: Physical Therapy

## 2023-03-02 DIAGNOSIS — R252 Cramp and spasm: Secondary | ICD-10-CM | POA: Diagnosis not present

## 2023-03-02 DIAGNOSIS — R2689 Other abnormalities of gait and mobility: Secondary | ICD-10-CM | POA: Diagnosis not present

## 2023-03-02 DIAGNOSIS — M25551 Pain in right hip: Secondary | ICD-10-CM | POA: Diagnosis not present

## 2023-03-02 DIAGNOSIS — M5441 Lumbago with sciatica, right side: Secondary | ICD-10-CM | POA: Diagnosis not present

## 2023-03-02 DIAGNOSIS — M6281 Muscle weakness (generalized): Secondary | ICD-10-CM | POA: Diagnosis not present

## 2023-03-02 DIAGNOSIS — M5416 Radiculopathy, lumbar region: Secondary | ICD-10-CM

## 2023-03-02 DIAGNOSIS — M25651 Stiffness of right hip, not elsewhere classified: Secondary | ICD-10-CM | POA: Diagnosis not present

## 2023-03-02 DIAGNOSIS — M62838 Other muscle spasm: Secondary | ICD-10-CM | POA: Diagnosis not present

## 2023-03-02 DIAGNOSIS — G8929 Other chronic pain: Secondary | ICD-10-CM | POA: Diagnosis not present

## 2023-03-02 NOTE — Therapy (Signed)
OUTPATIENT PHYSICAL THERAPY THORACOLUMBAR TREATMENT   Patient Name: Robin Christensen MRN: [id number] DOB:1951/06/03, 72 y.o., female Today's Date: 03/02/2023  END OF SESSION:  PT End of Session - 03/02/23 1356     Visit Number 5    Date for PT Re-Evaluation 04/12/23    Authorization Type [employer] MCR    Progress Note Due on Visit 10    PT Start Time 1400    PT Stop Time 1440    PT Time Calculation (min) 40 min    Activity Tolerance Patient tolerated treatment well              Past Medical History:  Diagnosis Date   A-fib (HCC)    B12 deficiency    Back pain    Breast cancer (HCC)    Cancer (HCC)    Right Breast Cancer   Chronic headaches    Dysrhythmia    AF   Edema, lower extremity    Gallbladder disease    GERD (gastroesophageal reflux disease)    Hyperlipidemia    Hypertension    Hypothyroidism    Joint pain    Obesity    Pneumonia    PONV (postoperative nausea and vomiting)    Sleep apnea    cpcap   Vitamin deficiency    Past Surgical History:  Procedure Laterality Date   BREAST SURGERY     CARDIOVERSION N/A 06/26/2014   Procedure: CARDIOVERSION;  Surgeon: Pamella Pert, MD;  Location: [facility] ENDOSCOPY;  Service: Cardiovascular;  Laterality: N/A;   CATARACT EXTRACTION     CHOLECYSTECTOMY     DILATION AND CURETTAGE OF UTERUS     eyelid surgery     TOTAL KNEE ARTHROPLASTY Right 05/03/2016   Procedure: TOTAL KNEE ARTHROPLASTY;  Surgeon: Marcene Corning, MD;  Location: [facility] OR;  Service: Orthopedics;  Laterality: Right;   TOTAL KNEE ARTHROPLASTY Left 08/14/2018   Procedure: TOTAL KNEE ARTHROPLASTY;  Surgeon: Marcene Corning, MD;  Location: [facility] OR;  Service: Orthopedics;  Laterality: Left;   Patient Active Problem List   Diagnosis Date Noted   Other fatigue increased 06/01/2020   Atrial fibrillation (HCC) 04/23/2020   Vitamin D deficiency 04/08/2020   Hyperglycemia 03/12/2020   Essential hypertension 03/12/2020   Mixed hyperlipidemia 01/14/2020    Lower extremity edema 01/14/2020   Depression 01/14/2020   Class 1 obesity with serious comorbidity and body mass index (BMI) of 34.0 to 34.9 in adult 01/14/2020   Primary osteoarthritis of left knee 08/14/2018   Primary localized osteoarthritis of right knee 05/03/2016   Primary osteoarthritis of right knee 05/03/2016   Abnormal auditory perception of both ears 12/01/2015    PCP: Farris Has, MD   REFERRING PROVIDER: Farris Has, MD   REFERRING DIAG: M25.50 (ICD-10-CM) - Pain in unspecified joint   Rationale for Evaluation and Treatment: Rehabilitation  THERAPY DIAG:  Radiculopathy, lumbar region  Cramp and spasm  Other abnormalities of gait and mobility  Muscle weakness (generalized)  ONSET DATE: 2020 progressively worsening in past 2-4 mos.  SUBJECTIVE:  SUBJECTIVE STATEMENT: Pt states needling helped. Has been able to sleep with pillow between her legs. Feels it primarily in the front of her groin. Pt will be going to wellness clinic next week.   PERTINENT HISTORY:  B TKR, AFIB,  From eval: Pain is worsening in the low back and left hip down her leg. Severe pain in L anterior hip x 3-4 months.  Will be seeing pain management also. On Eliquis so can't take much. Does go to [facility] intermittently, but has increased pain if does 30 min. Weakness in L LE with gait causing limp.  PAIN:  Are you having pain? Yes: NPRS scale: 0/10 Pain location: low back Pain description: ache sometimes sharp Aggravating factors: walk too fast of too big of steps, lying down Relieving factors: sitting in comfortable chair x 45 min  Are you having pain? Yes: NPRS scale: 3/10 Pain location: L and hip to mid shin Pain description: deep pain  Aggravating factors: walking, picking up from floor, dog dishes,  lying down Relieving factors: recliner some  PRECAUTIONS: None   WEIGHT BEARING RESTRICTIONS: No  FALLS:  Has patient fallen in last 6 months? 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 Mild lumbar spine spondylosis as described above. 2. No acute osseous injury of the lumbar spine.  PATIENT SURVEYS:  Modified Oswestry 31 / 50 = 62.0   MUSCLE LENGTH: NT  POSTURE:  left lateral shift; tight R lumbar  PALPATION: Marked pain in R gluteals and lumbar  LUMBAR ROM:   AROM eval  Flexion Hands to knees  Extension 0  Right lateral flexion 25%  Left lateral flexion About 5 deg  Right rotation 25%  Left rotation 25%   (Blank rows = not tested)  LOWER EXTREMITY MMT:     MMT  Right eval Left eval  Hip flexion 4+ 4  Hip extension 4 4  Hip abduction 4 5  Hip adduction 4+ 4+  Knee flexion 4+ 4+  Knee extension 5 5  Ankle dorsiflexion 5 5   (Blank rows = not tested)  LOWER EXTREMITY ROM:  WFL   LUMBAR SPECIAL TESTS: + hip scour L  FUNCTIONAL TESTS: unable to complete sit to stand without UE support  GAIT: Distance walked: 30 Assistive device utilized: None Level of assistance: Complete Independence Comments: antalgic, decreased stance time L  [facility] Adult PT Treatment:                                                DATE: 03/02/23 Therapeutic Exercise: Nustep L5 x 5 min UEs/LEs Lumbar extension against wall 2x30 sec Glute set with hands up on wall 2x10 Standing hip flexor stretch 2x30 sec Sitting hip adductor stretch x 30 sec  Supine PPT + hip  flexor stretch off EOB x30 sec Supine PPT + glute set 2x10 sec Sidelying clamshell 2x10 Standing glute med iso against wall 10x3 sec R&L Standing glute  max iso against wall 10x3 sec R&L Manual Therapy: L hip LAD STM & TPR L hip flexors Skilled assessment and palpation for TPDN Trigger Point Dry-Needling  Treatment instructions: Expect mild to moderate muscle soreness. S/S of pneumothorax if dry needled over a lung field, and to seek immediate medical attention should they occur. Patient verbalized understanding of these instructions and education.  Patient Consent Given: Yes Education handout provided: Previously provided Muscles treated: L psoas and TFL Electrical stimulation performed: No Parameters: N/A Treatment response/outcome: Twitch response illicited, decreased muscle tension Gait: Pregait: staggered stance working on ant/posterior weight shift maintaining hip extension x10 Fwd amb by counter x 3 working on maintaining trunk/hip extension and stance phase Bwd amb by counter x 3 working on glute activation and hip extension    [facility] Adult PT Treatment:                                                DATE: 02/28/23 Therapeutic Exercise: Nustep L5 x 5 min UEs/LEs Sidelying clamshell 3x10 Sidelying hip ext iso against wall 15x3 sec Sidelying TFL/hip flexor stretch 2x 30 sec Supine hip flexor stretch x30 sec Standing hip ext Manual Therapy: STM & TPR glute max/med, piriformis Skilled assessment and palpation for TPDN Trigger Point Dry-Needling  Treatment instructions: Expect mild to moderate muscle soreness. S/S of pneumothorax if dry needled over a lung field, and to seek immediate medical attention should they occur. Patient verbalized understanding of these instructions and education.  Patient Consent Given: Yes Education handout provided: Previously provided Muscles treated: L glute med/max, psoas  Electrical stimulation performed: No Parameters: N/A Treatment  response/outcome: Twitch response, decrease in muscle tension   [facility] Adult PT Treatment:                                                DATE: 02/21/23 Therapeutic Exercise: UBE L4 x 4 min alt fwd/back  Standing ball release work L hip area Sitting hip flexor stretch L 30 sec x 3 ed  Standing hip extension facing wall R/L x 10  Standing back to wall tighten core and push hips away from wall 3 sec x 10  Supine core 10 sec x 10  Supine hip flexor stretch x 30 sec Supine bridging with core tight (minimal lift) 3 sec x 10  Supine hip flexor stretch 30 sec x 2  Walking with core engaged x 120 ft Backward walking with SBA x 10 feet (unsteady Manual Therapy: STM & TPR L hip flexor, TSA, iliopsoas Trigger Point Dry-Needling  Treatment instructions: Expect mild to moderate muscle soreness. S/S of pneumothorax if dry needled over a lung field, and to seek immediate medical attention should they occur. Patient verbalized understanding of these instructions and education.  Patient Consent Given: Yes Education handout provided: Previously provided Muscles treated: L psoas; hip flexors/quad; TFL; lateral hip pt supine   Electrical stimulation performed: No Parameters: N/A Treatment response/outcome: decreased palpable tightness   Self care: Sitting with noodle and/or pillow along spine to improve sitting posture and alignment and decrease hip flexion  Suggestions for sleeping positions   [facility] Adult PT Treatment:  DATE: 02/16/23 Therapeutic Exercise: Seated pball flexion x30 sec, with lateral flexion x30 sec Supine hip flexor stretch x 30 sec Supine PPT + heel slide x10 (arm above head to stretch L anterior trunk/hip) Supine PPT + quad set x10 Sidelying QL stretch x30 sec Sidelying TFL stretch x 30 sec Sidelying clamshell 2x10 Supine bridge 2x10 LTR 5x10 sec Seated adductor stretch x 30 sec Lateral shift into wall 2x30 sec Lumbar extension  against wall 2x30 sec Standing hip extension 2x10 )  Manual Therapy: STM & TPR L hip flexor, TSA, iliopsoas   PATIENT EDUCATION:  Education details: Discussed HEP modifications, continuing self massage/myofacial release, TPDN with e-stim Person educated: Patient Education method: Explanation, Demonstration, and Handouts Education comprehension: verbalized understanding and returned demonstration  HOME EXERCISE PROGRAM: Access Code: [email or web address] URL: [email or web address] Date: 02/15/2023 Prepared by: Raynelle Fanning  Exercises - Supine Pelvic Tilt  - 1 x daily - 7 x weekly - 2 sets - 10 reps - Supine Lower Trunk Rotation  - 1 x daily - 7 x weekly - 1-2 sets - 5 reps - 10 sec hold - Supine Bridge  - 1 x daily - 7 x weekly - 3 sets - 10 reps - Right Standing Lateral Shift Correction at Wall - Hold  - 2 x daily - 7 x weekly - 1 sets - 10 reps - 5 sec hold - Standing Lumbar Extension at Wall - Forearms  - 2 x daily - 7 x weekly - 2 sets - 10 reps  - Dry Needling info - Aquatic PT - what to expect  ASSESSMENT:  CLINICAL IMPRESSION: Continued TPDN for hip flexors. Worked on glute activation and improving hip extension to keep stretching hip flexors. Enforced decreasing trunk flexion during stance phase of gait. Improving hip mobility noted.    GOALS: Goals reviewed with patient? Yes  SHORT TERM GOALS: Target date: 03/15/2023   Patient will be independent with initial HEP.  Baseline:  Goal status: INITIAL  2.  Patient will report centralization of radicular symptoms.  Baseline:  Goal status: INITIAL  3.  No evidence of lateral shift Baseline:  Goal status: INITIAL   LONG TERM GOALS: Target date: 04/12/2023   Patient will be independent with advanced/ongoing HEP to improve outcomes and carryover.  Baseline:  Goal status: INITIAL  2.  Patient will report 75% improvement in low back and leg pain to improve QOL.  Baseline:  Goal status: INITIAL  3.  Patient will  demonstrate functional pain free lumbar ROM to perform ADLs.   Baseline:  Goal status: INITIAL  4.  Patient will demonstrate improved hip strength to 5/5 to normalize gait, transfers and bed mobility. Baseline:  Goal status: INITIAL  5.  Patient will report 25/50 on Modified Oswestry to demonstrate improved functional ability.  Baseline: 31 / 50 = 62.0  Goal status: INITIAL   6.  Patient able to return to a walking program without pain provocation Baseline:  Goal status: INITIAL   PLAN:  PT FREQUENCY: 2x/week  PT DURATION: 8 weeks  PLANNED INTERVENTIONS: Therapeutic exercises, Therapeutic activity, Neuromuscular re-education, Balance training, Gait training, Patient/Family education, Self Care, Joint mobilization, Aquatic Therapy, Dry Needling, Electrical stimulation, Spinal mobilization, Cryotherapy, Moist heat, Taping, Traction, and Manual therapy.  PLAN FOR NEXT SESSION: DN to Left gluteals, lumbar, iliopsoas/hip flexors; assess lateral shift, work on lumbar ROM and try to get extension protocol started. Work on core and hip strengthening.   April Ma L , PT 03/02/2023, 1:56 PM

## 2023-03-06 ENCOUNTER — Ambulatory Visit: Payer: Medicare HMO | Admitting: Rehabilitative and Restorative Service Providers"

## 2023-03-06 ENCOUNTER — Encounter: Payer: Self-pay | Admitting: Rehabilitative and Restorative Service Providers"

## 2023-03-06 DIAGNOSIS — M5416 Radiculopathy, lumbar region: Secondary | ICD-10-CM | POA: Diagnosis not present

## 2023-03-06 DIAGNOSIS — M62838 Other muscle spasm: Secondary | ICD-10-CM

## 2023-03-06 DIAGNOSIS — M5441 Lumbago with sciatica, right side: Secondary | ICD-10-CM | POA: Diagnosis not present

## 2023-03-06 DIAGNOSIS — R252 Cramp and spasm: Secondary | ICD-10-CM

## 2023-03-06 DIAGNOSIS — G8929 Other chronic pain: Secondary | ICD-10-CM | POA: Diagnosis not present

## 2023-03-06 DIAGNOSIS — R2689 Other abnormalities of gait and mobility: Secondary | ICD-10-CM

## 2023-03-06 DIAGNOSIS — M6281 Muscle weakness (generalized): Secondary | ICD-10-CM | POA: Diagnosis not present

## 2023-03-06 DIAGNOSIS — M25551 Pain in right hip: Secondary | ICD-10-CM | POA: Diagnosis not present

## 2023-03-06 DIAGNOSIS — M25651 Stiffness of right hip, not elsewhere classified: Secondary | ICD-10-CM

## 2023-03-06 NOTE — Therapy (Signed)
OUTPATIENT PHYSICAL THERAPY THORACOLUMBAR TREATMENT   Patient Name: Robin Christensen MRN: [id number] DOB:11-27-1950, 72 y.o., female Today's Date: 03/06/2023  END OF SESSION:  PT End of Session - 03/06/23 1102     Visit Number 6    Date for PT Re-Evaluation 04/12/23    Progress Note Due on Visit 10    PT Start Time 1100    PT Stop Time 1148    PT Time Calculation (min) 48 min    Activity Tolerance Patient tolerated treatment well              Past Medical History:  Diagnosis Date   A-fib (HCC)    B12 deficiency    Back pain    Breast cancer (HCC)    Cancer (HCC)    Right Breast Cancer   Chronic headaches    Dysrhythmia    AF   Edema, lower extremity    Gallbladder disease    GERD (gastroesophageal reflux disease)    Hyperlipidemia    Hypertension    Hypothyroidism    Joint pain    Obesity    Pneumonia    PONV (postoperative nausea and vomiting)    Sleep apnea    cpcap   Vitamin deficiency    Past Surgical History:  Procedure Laterality Date   BREAST SURGERY     CARDIOVERSION N/A 06/26/2014   Procedure: CARDIOVERSION;  Surgeon: Pamella Pert, MD;  Location: [facility] ENDOSCOPY;  Service: Cardiovascular;  Laterality: N/A;   CATARACT EXTRACTION     CHOLECYSTECTOMY     DILATION AND CURETTAGE OF UTERUS     eyelid surgery     TOTAL KNEE ARTHROPLASTY Right 05/03/2016   Procedure: TOTAL KNEE ARTHROPLASTY;  Surgeon: Marcene Corning, MD;  Location: [facility] OR;  Service: Orthopedics;  Laterality: Right;   TOTAL KNEE ARTHROPLASTY Left 08/14/2018   Procedure: TOTAL KNEE ARTHROPLASTY;  Surgeon: Marcene Corning, MD;  Location: [facility] OR;  Service: Orthopedics;  Laterality: Left;   Patient Active Problem List   Diagnosis Date Noted   Other fatigue increased 06/01/2020   Atrial fibrillation (HCC) 04/23/2020   Vitamin D deficiency 04/08/2020   Hyperglycemia 03/12/2020   Essential hypertension 03/12/2020   Mixed hyperlipidemia 01/14/2020   Lower extremity edema 01/14/2020    Depression 01/14/2020   Class 1 obesity with serious comorbidity and body mass index (BMI) of 34.0 to 34.9 in adult 01/14/2020   Primary osteoarthritis of left knee 08/14/2018   Primary localized osteoarthritis of right knee 05/03/2016   Primary osteoarthritis of right knee 05/03/2016   Abnormal auditory perception of both ears 12/01/2015    PCP: Farris Has, MD   REFERRING PROVIDER: Farris Has, MD   REFERRING DIAG: M25.50 (ICD-10-CM) - Pain in unspecified joint   Rationale for Evaluation and Treatment: Rehabilitation  THERAPY DIAG:  Radiculopathy, lumbar region  Cramp and spasm  Other abnormalities of gait and mobility  Muscle weakness (generalized)  Chronic right-sided low back pain with right-sided sciatica  Pain in right hip  Stiffness of right hip, not elsewhere classified  Other muscle spasm  ONSET DATE: 2020 progressively worsening in past 2-4 mos.  SUBJECTIVE:  SUBJECTIVE STATEMENT: Patient reports that she has continued improvement in the low back and L hip and leg pain. The needling helps. Has been able to sleep with pillow between her legs. She has a pillow behind her back in her recliner.  Pt will be going to wellness clinic this week. She mopped her kitchen floor yesterday and can feel it today.   PERTINENT HISTORY:  B TKR, AFIB,  From eval: Pain is worsening in the low back and left hip down her leg. Severe pain in L anterior hip x 3-4 months.  Will be seeing pain management also. On Eliquis so can't take much. Does go to [name] intermittently, but has increased pain if does 30 min. Weakness in L LE with gait causing limp.  PAIN:  Are you having pain? Yes: NPRS scale: 3-4/10 Pain location: low back Pain description: ache sometimes sharp Aggravating factors: walk too  fast of too big of steps, lying down Relieving factors: sitting in comfortable chair x 45 min  Are you having pain? Yes: NPRS scale: 3-4/10 Pain location: L and hip to mid shin Pain description: deep pain  Aggravating factors: walking, picking up from floor, dog dishes, lying down Relieving factors: recliner some  PRECAUTIONS: None   WEIGHT BEARING RESTRICTIONS: No  FALLS:  Has patient fallen in last 6 months? 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 Mild lumbar spine spondylosis as described above. 2. No acute osseous injury of the lumbar spine.  PATIENT SURVEYS:  Modified Oswestry 31 / 50 = 62.0   MUSCLE LENGTH: NT  POSTURE:  left lateral shift; tight R lumbar  PALPATION: Marked pain in R gluteals and lumbar  LUMBAR ROM:   AROM eval  Flexion Hands to knees  Extension 0  Right lateral flexion 25%  Left lateral flexion About 5 deg  Right rotation 25%  Left rotation 25%   (Blank rows = not tested)  LOWER EXTREMITY MMT:     MMT  Right eval Left eval  Hip flexion 4+ 4  Hip extension 4 4  Hip abduction 4 5  Hip adduction 4+ 4+  Knee flexion 4+ 4+  Knee extension 5 5  Ankle dorsiflexion 5 5   (Blank rows = not tested)  LOWER EXTREMITY ROM:  WFL   LUMBAR SPECIAL TESTS: + hip scour L  FUNCTIONAL TESTS: unable to complete sit to stand without UE support  GAIT: Distance walked: 30 Assistive device utilized: None Level of assistance: Complete Independence Comments: antalgic, decreased stance time L  [facility] Adult PT Treatment:                                                 DATE: 03/06/23 Therapeutic Exercise: Nustep L6 x 7 min UEs/LEs Glut set prone 10 sec x 10  Lumbar extension against wall 2x30 sec Glute set with hands up on wall 2x10 Sit to stand hinged hip core tight x  5  Standing facing wall trunk extension; glut set 10 sec x 5 Standing facing wall hip flexor stretch L with L foot behind R  2x30 sec Supine hip flexor stretch L x 2; R x 1 LE off EOB x 30 sec VC for core activation and breathing  Standing glute med iso against wall 10x3 sec R&L holding chair for balance  Standing glute max iso against wall 10x3 sec R&L Manual Therapy: L hip IR//ER windmill  STM & TPR L hip flexors Skilled assessment and palpation for TPDN Trigger Point Dry-Needling  Treatment instructions: Expect mild to moderate muscle soreness. S/S of pneumothorax if dry needled over a lung field, and to seek immediate medical attention should they occur. Patient verbalized understanding of these instructions and education.  Patient Consent Given: Yes Education handout provided: Previously provided Muscles treated: L lower lumbar paraspinals; piriformis; gluts  Electrical stimulation performed: Yes Parameters: mAmp current; intensity adjusted to patient tolerance  Treatment response/outcome: Twitch response illicited, decreased muscle tension Gait: Pregait: staggered stance working on ant/posterior weight shift maintaining hip extension x10 Fwd amb 40 feet working on maintaining trunk/hip extension and stance phase Fed/Bwd amb by counter x 4 working on glute activation and hip extension  [facility] Adult PT Treatment:                                                DATE: 03/02/23 Therapeutic Exercise: Nustep L5 x 5 min UEs/LEs Lumbar extension against wall 2x30 sec Glute set with hands up on wall 2x10 Standing hip flexor stretch 2x30 sec Sitting hip adductor stretch x 30 sec  Supine PPT + hip flexor stretch off EOB x30 sec Supine PPT + glute set  2x10 sec Sidelying clamshell 2x10 Standing glute med iso against wall 10x3 sec R&L Standing glute max iso against wall 10x3 sec R&L Manual Therapy: L hip LAD STM & TPR L hip flexors Skilled assessment and palpation for TPDN Trigger Point Dry-Needling  Treatment instructions: Expect mild to moderate muscle soreness. S/S of pneumothorax if dry needled over a lung field, and to seek immediate medical attention should they occur. Patient verbalized understanding of these instructions and education.  Patient Consent Given: Yes Education handout provided: Previously provided Muscles treated: L psoas and TFL Electrical stimulation performed: No Parameters: N/A Treatment response/outcome: Twitch response illicited, decreased muscle tension Gait: Pregait: staggered stance working on ant/posterior weight shift maintaining hip extension x10 Fwd amb by counter x 3 working on maintaining trunk/hip extension and stance phase Bwd amb by counter x 3 working on glute activation and hip extension    PATIENT EDUCATION:  Education details: Discussed HEP modifications, continuing self massage/myofacial release, TPDN with e-stim Person educated: Patient Education method: Explanation, Demonstration, and Handouts Education comprehension: verbalized understanding and returned demonstration  HOME EXERCISE PROGRAM: Access Code: [email or web address] URL: [email or web address] Date: 03/06/2023 Prepared by: Corlis Leak  Program Notes standing facing wall press hips forward toward wall hold 10 or more seconds repeat with left foot slightly behind R foot back to wall holding chair as needed for balance, place one foot flat on wall behind you with knee bent press foot into wall hold 3 sec x 10 right/left standing straight and tall squeeze buttock tight hold and repeat several times during the day practice walking at counter forwards and backwards - stay tall, hip first, core tight   Exercises -  Supine Pelvic  Tilt  - 1 x daily - 7 x weekly - 2 sets - 10 reps - Supine Lower Trunk Rotation  - 1 x daily - 7 x weekly - 1-2 sets - 5 reps - 10 sec hold - Supine Bridge  - 1 x daily - 7 x weekly - 3 sets - 10 reps - Right Standing Lateral Shift Correction at Wall - Hold  - 2 x daily - 7 x weekly - 1 sets - 10 reps - 5 sec hold - Standing Lumbar Extension at Wall - Forearms  - 2 x daily - 7 x weekly - 2 sets - 10 reps - Standing Hip Extension with Counter Support  - 1 x daily - 7 x weekly - 2 sets - 10 reps - Sidelying TFL Stretch  - 1 x daily - 7 x weekly - 2 sets - 10 reps - Clamshell  - 1 x daily - 7 x weekly - 2 sets - 10 reps  - Dry Needling info - Aquatic PT - what to expect  ASSESSMENT:  CLINICAL IMPRESSION: Flare up of symptoms related to patient increasing activity at home as she has felt better. Working on areas of muscular tightness in the L posterior hip/lower lumbar spine with dry needling and manual work. Continued with strengthening and core stabilization. Needs continued focus on strengthening and stabilization as well as work on gait. Worked on glute activation and improving hip extension to keep stretching hip flexors. Enforced decreasing trunk flexion during stance phase of gait. Improving hip mobility noted. Will see aquatic therapy at next visit.    GOALS: Goals reviewed with patient? Yes  SHORT TERM GOALS: Target date: 03/15/2023   Patient will be independent with initial HEP.  Baseline:  Goal status: INITIAL  2.  Patient will report centralization of radicular symptoms.  Baseline:  Goal status: INITIAL  3.  No evidence of lateral shift Baseline:  Goal status: INITIAL   LONG TERM GOALS: Target date: 04/12/2023   Patient will be independent with advanced/ongoing HEP to improve outcomes and carryover.  Baseline:  Goal status: INITIAL  2.  Patient will report 75% improvement in low back and leg pain to improve QOL.  Baseline:  Goal status: INITIAL  3.  Patient will  demonstrate functional pain free lumbar ROM to perform ADLs.   Baseline:  Goal status: INITIAL  4.  Patient will demonstrate improved hip strength to 5/5 to normalize gait, transfers and bed mobility. Baseline:  Goal status: INITIAL  5.  Patient will report 25/50 on Modified Oswestry to demonstrate improved functional ability.  Baseline: 31 / 50 = 62.0  Goal status: INITIAL   6.  Patient able to return to a walking program without pain provocation Baseline:  Goal status: INITIAL   PLAN:  PT FREQUENCY: 2x/week  PT DURATION: 8 weeks  PLANNED INTERVENTIONS: Therapeutic exercises, Therapeutic activity, Neuromuscular re-education, Balance training, Gait training, Patient/Family education, Self Care, Joint mobilization, Aquatic Therapy, Dry Needling, Electrical stimulation, Spinal mobilization, Cryotherapy, Moist heat, Taping, Traction, and Manual therapy.  PLAN FOR NEXT SESSION: DN to Left gluteals, lumbar, iliopsoas/hip flexors; assess lateral shift, work on lumbar ROM and try to get extension protocol started. Work on core and hip strengthening.  Yuktha Kerchner Rober Minion, PT 03/06/2023, 11:03 AM

## 2023-03-07 DIAGNOSIS — R7303 Prediabetes: Secondary | ICD-10-CM | POA: Diagnosis not present

## 2023-03-07 DIAGNOSIS — E785 Hyperlipidemia, unspecified: Secondary | ICD-10-CM | POA: Diagnosis not present

## 2023-03-07 DIAGNOSIS — R632 Polyphagia: Secondary | ICD-10-CM | POA: Diagnosis not present

## 2023-03-07 DIAGNOSIS — I700 Atherosclerosis of aorta: Secondary | ICD-10-CM | POA: Diagnosis not present

## 2023-03-07 DIAGNOSIS — Z6841 Body Mass Index (BMI) 40.0 and over, adult: Secondary | ICD-10-CM | POA: Diagnosis not present

## 2023-03-07 DIAGNOSIS — E559 Vitamin D deficiency, unspecified: Secondary | ICD-10-CM | POA: Diagnosis not present

## 2023-03-09 DIAGNOSIS — R350 Frequency of micturition: Secondary | ICD-10-CM | POA: Diagnosis not present

## 2023-03-09 DIAGNOSIS — N3946 Mixed incontinence: Secondary | ICD-10-CM | POA: Diagnosis not present

## 2023-03-14 ENCOUNTER — Encounter (HOSPITAL_BASED_OUTPATIENT_CLINIC_OR_DEPARTMENT_OTHER): Payer: Self-pay | Admitting: Physical Therapy

## 2023-03-14 ENCOUNTER — Ambulatory Visit (HOSPITAL_BASED_OUTPATIENT_CLINIC_OR_DEPARTMENT_OTHER): Payer: Medicare HMO | Attending: Family Medicine | Admitting: Physical Therapy

## 2023-03-14 DIAGNOSIS — R2689 Other abnormalities of gait and mobility: Secondary | ICD-10-CM | POA: Diagnosis not present

## 2023-03-14 DIAGNOSIS — M6281 Muscle weakness (generalized): Secondary | ICD-10-CM | POA: Insufficient documentation

## 2023-03-14 DIAGNOSIS — M5416 Radiculopathy, lumbar region: Secondary | ICD-10-CM | POA: Diagnosis not present

## 2023-03-14 DIAGNOSIS — R252 Cramp and spasm: Secondary | ICD-10-CM | POA: Diagnosis not present

## 2023-03-14 NOTE — Therapy (Signed)
OUTPATIENT PHYSICAL THERAPY THORACOLUMBAR TREATMENT   Patient Name: Robin Christensen MRN: [id number] DOB:[birth year], 72 y.o., female Today's Date: 03/14/2023  END OF SESSION:  PT End of Session - 03/14/23 0855     Visit Number 7    Date for PT Re-Evaluation 04/12/23    Authorization Type [employer] MCR    Progress Note Due on Visit 10    PT Start Time 0817    PT Stop Time 0857    PT Time Calculation (min) 40 min              Past Medical History:  Diagnosis Date   A-fib (HCC)    B12 deficiency    Back pain    Breast cancer (HCC)    Cancer (HCC)    Right Breast Cancer   Chronic headaches    Dysrhythmia    AF   Edema, lower extremity    Gallbladder disease    GERD (gastroesophageal reflux disease)    Hyperlipidemia    Hypertension    Hypothyroidism    Joint pain    Obesity    Pneumonia    PONV (postoperative nausea and vomiting)    Sleep apnea    cpcap   Vitamin deficiency    Past Surgical History:  Procedure Laterality Date   BREAST SURGERY     CARDIOVERSION N/A 06/26/2014   Procedure: CARDIOVERSION;  Surgeon: Pamella Pert, MD;  Location: [facility] ENDOSCOPY;  Service: Cardiovascular;  Laterality: N/A;   CATARACT EXTRACTION     CHOLECYSTECTOMY     DILATION AND CURETTAGE OF UTERUS     eyelid surgery     TOTAL KNEE ARTHROPLASTY Right 05/03/2016   Procedure: TOTAL KNEE ARTHROPLASTY;  Surgeon: Marcene Corning, MD;  Location: [facility] OR;  Service: Orthopedics;  Laterality: Right;   TOTAL KNEE ARTHROPLASTY Left 08/14/2018   Procedure: TOTAL KNEE ARTHROPLASTY;  Surgeon: Marcene Corning, MD;  Location: [facility] OR;  Service: Orthopedics;  Laterality: Left;   Patient Active Problem List   Diagnosis Date Noted   Other fatigue increased 06/01/2020   Atrial fibrillation (HCC) 04/23/2020   Vitamin D deficiency 04/08/2020   Hyperglycemia 03/12/2020   Essential hypertension 03/12/2020   Mixed hyperlipidemia 01/14/2020   Lower extremity edema 01/14/2020   Depression 01/14/2020    Class 1 obesity with serious comorbidity and body mass index (BMI) of 34.0 to 34.9 in adult 01/14/2020   Primary osteoarthritis of left knee 08/14/2018   Primary localized osteoarthritis of right knee 05/03/2016   Primary osteoarthritis of right knee 05/03/2016   Abnormal auditory perception of both ears 12/01/2015    PCP: Farris Has, MD   REFERRING PROVIDER: Farris Has, MD   REFERRING DIAG: M25.50 (ICD-10-CM) - Pain in unspecified joint   Rationale for Evaluation and Treatment: Rehabilitation  THERAPY DIAG:  Radiculopathy, lumbar region  Cramp and spasm  Other abnormalities of gait and mobility  ONSET DATE: 2020 progressively worsening in past 2-4 mos.  SUBJECTIVE:  SUBJECTIVE STATEMENT: Patient reports that her Lt hip, knee and back continue to be painful.  She tried walking at [facility] in pool for 30 min, but felt "beat up" for the next few days.    PERTINENT HISTORY:  B TKR, AFIB,  From eval: Pain is worsening in the low back and left hip down her leg. Severe pain in L anterior hip x 3-4 months.  Will be seeing pain management also. On Eliquis so can't take much. Does go to [facility] intermittently, but has increased pain if does 30 min. Weakness in L LE with gait causing limp.  PAIN:  Are you having pain? Yes: NPRS scale: 6/10 Pain location: low back, Lt hip Pain description: ache sometimes sharp Aggravating factors: walk too fast of too big of steps, lying down Relieving factors: sitting in comfortable chair x 45 min    PRECAUTIONS: None   WEIGHT BEARING RESTRICTIONS: No  FALLS:  Has patient fallen in last 6 months? 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 Mild lumbar spine spondylosis as described above. 2. No acute osseous injury of the lumbar spine.  PATIENT SURVEYS:  Modified Oswestry 31 / 50 = 62.0   MUSCLE LENGTH: NT  POSTURE:  left lateral shift; tight R lumbar  PALPATION: Marked pain in R gluteals and lumbar  LUMBAR ROM:   AROM eval  Flexion Hands to knees  Extension 0  Right lateral flexion 25%  Left lateral flexion About 5 deg  Right rotation 25%  Left rotation 25%   (Blank rows = not tested)  LOWER EXTREMITY MMT:     MMT  Right eval Left eval  Hip flexion 4+ 4  Hip extension 4 4  Hip abduction 4 5  Hip adduction 4+ 4+  Knee flexion 4+ 4+  Knee extension 5 5  Ankle dorsiflexion 5 5   (Blank rows = not tested)  LOWER EXTREMITY ROM:  WFL   LUMBAR SPECIAL TESTS: + hip scour L  FUNCTIONAL TESTS: unable to complete sit to stand without UE support  GAIT: Distance walked: 30 Assistive device utilized: None Level of assistance: Complete Independence Comments: antalgic, decreased stance time L [facility] Adult PT Treatment:                                                DATE: 03/14/23 Pt seen for aquatic therapy today.  Treatment took place in water 3.5-4.75 ft in depth at the [facility] pool. Temp of water was 91.  Pt entered/exited the pool via stairs independently with bilat rail in step-to pattern.  * unsupported -Walking forward/ backward/ side stepping  (relaxed squat in between laps as needed for pain relief) * marching with row motion with arms - UE on rainbow hand floats * standard stance with arm addct with rainbow hand floats x 10 * Holding wall: heel raises x 10, hip abdct/ addct 2 x 5;  leg swings  into hip flex/ext x 10; Lt hip ext x10 * Seated on bench: fig 4 with LLE for hip stretch   Pt requires the buoyancy and hydrostatic pressure of water for support, and to offload joints by unweighting joint load by at least 50 % in navel deep water and by at least 75-80% in chest to neck deep water.  Viscosity of the water is needed for resistance of strengthening. Water current perturbations provides challenge to standing balance requiring increased core activation.   [facility] Adult PT Treatment:                                                DATE: 03/06/23 Therapeutic Exercise: Nustep L6 x 7 min UEs/LEs Glut set prone 10 sec x 10  Lumbar extension against wall 2x30 sec Glute set with hands up on wall 2x10 Sit to stand hinged hip core tight x 5  Standing facing wall trunk extension; glut set 10 sec x 5 Standing facing wall hip flexor stretch L with L foot behind R  2x30 sec Supine hip flexor stretch L x 2; R x 1 LE off EOB x 30 sec VC for core activation and breathing  Standing glute med iso against wall 10x3 sec R&L holding chair for balance  Standing glute max iso against wall 10x3 sec R&L Manual Therapy: L hip IR//ER windmill  STM & TPR L hip flexors Skilled assessment and palpation for TPDN Trigger Point Dry-Needling  Treatment instructions: Expect mild to moderate muscle soreness. S/S of pneumothorax if dry needled over a lung field, and to seek immediate medical attention should they occur. Patient verbalized understanding of these instructions and education.  Patient Consent Given: Yes Education handout provided: Previously provided Muscles treated: L lower lumbar paraspinals; piriformis; gluts  Electrical stimulation performed: Yes Parameters: mAmp current; intensity adjusted to patient tolerance  Treatment response/outcome: Twitch response illicited, decreased muscle tension Gait: Pregait: staggered stance working on ant/posterior weight shift maintaining hip extension  x10 Fwd amb 40 feet working on maintaining trunk/hip extension and stance phase Fed/Bwd amb by counter x 4 working on glute activation and hip extension  [facility] Adult PT Treatment:                                                DATE: 03/02/23 Therapeutic Exercise: Nustep L5 x 5 min UEs/LEs Lumbar extension against wall 2x30 sec Glute set with hands up on wall 2x10 Standing hip flexor stretch 2x30 sec Sitting hip adductor stretch x 30 sec  Supine PPT + hip flexor stretch off EOB x30 sec Supine PPT + glute set 2x10 sec Sidelying clamshell 2x10 Standing glute med iso against wall 10x3 sec R&L Standing glute max iso against wall 10x3 sec R&L Manual Therapy: L hip LAD STM & TPR L hip flexors Skilled assessment and palpation for TPDN Trigger Point Dry-Needling  Treatment instructions: Expect mild to moderate muscle soreness. S/S of pneumothorax if dry needled over a lung field, and to seek immediate medical attention should they occur. Patient verbalized understanding of these instructions and education.  Patient Consent Given: Yes Education handout provided: Previously provided Muscles treated: L psoas and TFL Electrical stimulation performed: No Parameters: N/A Treatment response/outcome: Twitch response illicited, decreased  muscle tension Gait: Pregait: staggered stance working on ant/posterior weight shift maintaining hip extension x10 Fwd amb by counter x 3 working on maintaining trunk/hip extension and stance phase Bwd amb by counter x 3 working on glute activation and hip extension    PATIENT EDUCATION:  Education details:aquatic therapy intro Person educated: Patient Education method: Programmer, multimedia, Facilities manager, Education comprehension: verbalized understanding and returned demonstration  HOME EXERCISE PROGRAM: Access Code: [email or web address] URL: [email or web address] Date: 03/06/2023 Prepared by: Corlis Leak  Program Notes standing facing wall press hips forward  toward wall hold 10 or more seconds repeat with left foot slightly behind R foot back to wall holding chair as needed for balance, place one foot flat on wall behind you with knee bent press foot into wall hold 3 sec x 10 right/left standing straight and tall squeeze buttock tight hold and repeat several times during the day practice walking at counter forwards and backwards - stay tall, hip first, core tight   Exercises - Supine Pelvic Tilt  - 1 x daily - 7 x weekly - 2 sets - 10 reps - Supine Lower Trunk Rotation  - 1 x daily - 7 x weekly - 1-2 sets - 5 reps - 10 sec hold - Supine Bridge  - 1 x daily - 7 x weekly - 3 sets - 10 reps - Right Standing Lateral Shift Correction at Wall - Hold  - 2 x daily - 7 x weekly - 1 sets - 10 reps - 5 sec hold - Standing Lumbar Extension at Wall - Forearms  - 2 x daily - 7 x weekly - 2 sets - 10 reps - Standing Hip Extension with Counter Support  - 1 x daily - 7 x weekly - 2 sets - 10 reps - Sidelying TFL Stretch  - 1 x daily - 7 x weekly - 2 sets - 10 reps - Clamshell  - 1 x daily - 7 x weekly - 2 sets - 10 reps  - Dry Needling info - Aquatic PT - what to expect  ASSESSMENT:  CLINICAL IMPRESSION: Pt is safe and independent in aquatic setting and able to take direction from therapist on deck. Pt doesn't know how to swim, so may need assistance/ cues if in deeper water with use of floatation devices (noodles, etc).  She reported some increase in Lt hip pain with side stepping; decreased with decrease in step length.  Pt is observed moving in slow, steady manner throughout session.  Pain remained 6/10 during session.  Will plan to progress as tolerated.  Goals are ongoing.    GOALS: Goals reviewed with patient? Yes  SHORT TERM GOALS: Target date: 03/15/2023   Patient will be independent with initial HEP.  Baseline:  Goal status: INITIAL  2.  Patient will report centralization of radicular symptoms.  Baseline:  Goal status: INITIAL  3.  No evidence  of lateral shift Baseline:  Goal status: INITIAL   LONG TERM GOALS: Target date: 04/12/2023   Patient will be independent with advanced/ongoing HEP to improve outcomes and carryover.  Baseline:  Goal status: INITIAL  2.  Patient will report 75% improvement in low back and leg pain to improve QOL.  Baseline:  Goal status: INITIAL  3.  Patient will demonstrate functional pain free lumbar ROM to perform ADLs.   Baseline:  Goal status: INITIAL  4.  Patient will demonstrate improved hip strength to 5/5 to normalize gait, transfers and bed mobility. Baseline:  Goal status: INITIAL  5.  Patient will report 25/50 on Modified Oswestry to demonstrate improved functional ability.  Baseline: 31 / 50 = 62.0  Goal status: INITIAL   6.  Patient able to return to a walking program without pain provocation Baseline:  Goal status: INITIAL   PLAN:  PT FREQUENCY: 2x/week  PT DURATION: 8 weeks  PLANNED INTERVENTIONS: Therapeutic exercises, Therapeutic activity, Neuromuscular re-education, Balance training, Gait training, Patient/Family education, Self Care, Joint mobilization, Aquatic Therapy, Dry Needling, Electrical stimulation, Spinal mobilization, Cryotherapy, Moist heat, Taping, Traction, and Manual therapy.  PLAN FOR NEXT SESSION: DN to Left gluteals, lumbar, iliopsoas/hip flexors; assess lateral shift, work on lumbar ROM and try to get extension protocol started. Work on core and hip strengthening.  Mayer Camel, PTA 03/14/23 11:16 AM [facility] [address] [place], [place], [address] Phone: [phone]   Fax:  [phone]

## 2023-03-16 ENCOUNTER — Ambulatory Visit (HOSPITAL_BASED_OUTPATIENT_CLINIC_OR_DEPARTMENT_OTHER): Payer: Medicare HMO | Admitting: Physical Therapy

## 2023-03-16 ENCOUNTER — Encounter (HOSPITAL_BASED_OUTPATIENT_CLINIC_OR_DEPARTMENT_OTHER): Payer: Self-pay | Admitting: Physical Therapy

## 2023-03-16 DIAGNOSIS — R252 Cramp and spasm: Secondary | ICD-10-CM | POA: Diagnosis not present

## 2023-03-16 DIAGNOSIS — M5416 Radiculopathy, lumbar region: Secondary | ICD-10-CM

## 2023-03-16 DIAGNOSIS — R2689 Other abnormalities of gait and mobility: Secondary | ICD-10-CM | POA: Diagnosis not present

## 2023-03-16 DIAGNOSIS — M6281 Muscle weakness (generalized): Secondary | ICD-10-CM | POA: Diagnosis not present

## 2023-03-16 NOTE — Therapy (Signed)
OUTPATIENT PHYSICAL THERAPY THORACOLUMBAR TREATMENT   Patient Name: Robin Christensen MRN: [id number] DOB:April 04, 1951, 72 y.o., female Today's Date: 03/16/2023  END OF SESSION:  PT End of Session - 03/16/23 0950     Visit Number 8    Date for PT Re-Evaluation 04/12/23    Authorization Type [employer] MCR    Progress Note Due on Visit 10    PT Start Time 0945    PT Stop Time 1025    PT Time Calculation (min) 40 min    Behavior During Therapy WFL for tasks assessed/performed              Past Medical History:  Diagnosis Date   A-fib (HCC)    B12 deficiency    Back pain    Breast cancer (HCC)    Cancer (HCC)    Right Breast Cancer   Chronic headaches    Dysrhythmia    AF   Edema, lower extremity    Gallbladder disease    GERD (gastroesophageal reflux disease)    Hyperlipidemia    Hypertension    Hypothyroidism    Joint pain    Obesity    Pneumonia    PONV (postoperative nausea and vomiting)    Sleep apnea    cpcap   Vitamin deficiency    Past Surgical History:  Procedure Laterality Date   BREAST SURGERY     CARDIOVERSION N/A 06/26/2014   Procedure: CARDIOVERSION;  Surgeon: Pamella Pert, MD;  Location: [facility] ENDOSCOPY;  Service: Cardiovascular;  Laterality: N/A;   CATARACT EXTRACTION     CHOLECYSTECTOMY     DILATION AND CURETTAGE OF UTERUS     eyelid surgery     TOTAL KNEE ARTHROPLASTY Right 05/03/2016   Procedure: TOTAL KNEE ARTHROPLASTY;  Surgeon: Marcene Corning, MD;  Location: [facility] OR;  Service: Orthopedics;  Laterality: Right;   TOTAL KNEE ARTHROPLASTY Left 08/14/2018   Procedure: TOTAL KNEE ARTHROPLASTY;  Surgeon: Marcene Corning, MD;  Location: [facility] OR;  Service: Orthopedics;  Laterality: Left;   Patient Active Problem List   Diagnosis Date Noted   Other fatigue increased 06/01/2020   Atrial fibrillation (HCC) 04/23/2020   Vitamin D deficiency 04/08/2020   Hyperglycemia 03/12/2020   Essential hypertension 03/12/2020   Mixed hyperlipidemia 01/14/2020    Lower extremity edema 01/14/2020   Depression 01/14/2020   Class 1 obesity with serious comorbidity and body mass index (BMI) of 34.0 to 34.9 in adult 01/14/2020   Primary osteoarthritis of left knee 08/14/2018   Primary localized osteoarthritis of right knee 05/03/2016   Primary osteoarthritis of right knee 05/03/2016   Abnormal auditory perception of both ears 12/01/2015    PCP: Farris Has, MD   REFERRING PROVIDER: Farris Has, MD   REFERRING DIAG: M25.50 (ICD-10-CM) - Pain in unspecified joint   Rationale for Evaluation and Treatment: Rehabilitation  THERAPY DIAG:  Radiculopathy, lumbar region  Cramp and spasm  Other abnormalities of gait and mobility  Muscle weakness (generalized)  ONSET DATE: 2020 progressively worsening in past 2-4 mos.  SUBJECTIVE:  SUBJECTIVE STATEMENT: Patient reports that she felt pretty good after last session.  Pain overall has been lower.  Hasn't been to the [facility] yet; plans to go with a friend initially, then on own.   PERTINENT HISTORY:  B TKR, AFIB,  From eval: Pain is worsening in the low back and left hip down her leg. Severe pain in L anterior hip x 3-4 months.  Will be seeing pain management also. On Eliquis so can't take much. Does go to [facility] intermittently, but has increased pain if does 30 min. Weakness in L LE with gait causing limp.  PAIN:  Are you having pain? Yes: NPRS scale: 1/10 Pain location: low back/sacrum Pain description: ache sometimes sharp Aggravating factors: walk too fast of too big of steps, lying down Relieving factors: sitting in comfortable chair x 45 min    PRECAUTIONS: None   WEIGHT BEARING RESTRICTIONS: No  FALLS:  Has patient fallen in last 6 months? 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 Mild lumbar spine spondylosis as described above. 2. No acute osseous injury of the lumbar spine.  PATIENT SURVEYS:  Modified Oswestry 31 / 50 = 62.0   MUSCLE LENGTH: NT  POSTURE:  left lateral shift; tight R lumbar  PALPATION: Marked pain in R gluteals and lumbar  LUMBAR ROM:   AROM eval  Flexion Hands to knees  Extension 0  Right lateral flexion 25%  Left lateral flexion About 5 deg  Right rotation 25%  Left rotation 25%   (Blank rows = not tested)  LOWER EXTREMITY MMT:     MMT  Right eval Left eval  Hip flexion 4+ 4  Hip extension 4 4  Hip abduction 4 5  Hip adduction 4+ 4+  Knee flexion 4+ 4+  Knee extension 5 5  Ankle dorsiflexion 5 5   (Blank rows = not tested)  LOWER EXTREMITY ROM:  WFL   LUMBAR SPECIAL TESTS: + hip scour L  FUNCTIONAL TESTS: unable to complete sit to stand without UE support  GAIT: Distance walked: 30 Assistive device utilized: None Level of assistance: Complete Independence Comments: antalgic, decreased stance time L  [facility] Adult PT Treatment:                                                DATE: 03/16/23 Pt seen for aquatic therapy today.  Treatment took place in water 3.5-4.75 ft in depth at the [facility] pool. Temp of water was 91.  Pt entered/exited the pool via stairs independently with bilat rail in step-to pattern.  * unsupported -Walking forward/ backward/ side stepping  (relaxed squat in between laps as needed for pain relief) * Holding wall: heel /toe raises x 15, leg swings into hip flex/ext 3 sets x 5 *  return to walking forward/ backward  * straddling noodle with UE on wall:  cycling, hip abdct/ addct, cross country ski * vertical relaxed  squat UE On wall x 6 * quad stretch L/R/L with foot on 2nd step x 15s  Pt requires the buoyancy and hydrostatic pressure of water for support, and to offload joints by unweighting joint load by at least 50 % in navel deep water and by at least 75-80% in chest to neck deep water.  Viscosity of the water is needed for resistance of strengthening. Water current perturbations provides challenge to standing balance requiring increased core activation.   [facility] Adult PT Treatment:                                                DATE: 03/14/23 Pt seen for aquatic therapy today.  Treatment took place in water 3.5-4.75 ft in depth at the [facility] pool. Temp of water was 91.  Pt entered/exited the pool via stairs independently with bilat rail in step-to pattern.  * unsupported -Walking forward/ backward/ side stepping  (relaxed squat in between laps as needed for pain relief) * marching with row motion with arms - UE on rainbow hand floats * standard stance with arm addct with rainbow hand floats x 10 * Holding wall: heel raises x 10, hip abdct/ addct 2 x 5; leg swings into hip flex/ext x 10; Lt hip ext x10 * Seated on bench: fig 4 with LLE for hip stretch   Pt requires the buoyancy and hydrostatic pressure of water for support, and to offload joints by unweighting joint load by at least 50 % in navel deep water and by at least 75-80% in chest to neck deep water.  Viscosity of the water is needed for resistance of strengthening. Water current perturbations provides challenge to standing balance requiring increased core activation.   [facility] Adult PT Treatment:                                                DATE: 03/06/23 Therapeutic Exercise: Nustep L6 x 7 min UEs/LEs Glut set prone 10 sec x 10  Lumbar extension against wall 2x30 sec Glute set with hands up on  wall 2x10 Sit to stand hinged hip core tight x 5  Standing facing wall trunk extension; glut set 10 sec x 5 Standing facing wall hip flexor stretch L with L foot behind R  2x30 sec Supine hip flexor stretch L x 2; R x 1 LE off EOB x 30 sec VC for core activation and breathing  Standing glute med iso against wall 10x3 sec R&L holding chair for balance  Standing glute max iso against wall 10x3 sec R&L Manual Therapy: L hip IR//ER windmill  STM & TPR L hip flexors Skilled assessment and palpation for TPDN Trigger Point Dry-Needling  Treatment instructions: Expect mild to moderate muscle soreness. S/S of pneumothorax if dry needled over a lung field, and to seek immediate medical attention should they occur. Patient verbalized understanding of these instructions and education.  Patient Consent Given: Yes Education handout provided: Previously provided Muscles treated: L lower lumbar paraspinals; piriformis; gluts  Electrical stimulation performed: Yes Parameters: mAmp current; intensity adjusted to patient tolerance  Treatment response/outcome: Twitch response illicited, decreased muscle tension Gait: Pregait: staggered stance working on ant/posterior weight shift maintaining hip extension x10 Fwd amb 40  feet working on maintaining trunk/hip extension and stance phase Fed/Bwd amb by counter x 4 working on glute activation and hip extension  [facility] Adult PT Treatment:                                                DATE: 03/02/23 Therapeutic Exercise: Nustep L5 x 5 min UEs/LEs Lumbar extension against wall 2x30 sec Glute set with hands up on wall 2x10 Standing hip flexor stretch 2x30 sec Sitting hip adductor stretch x 30 sec  Supine PPT + hip flexor stretch off EOB x30 sec Supine PPT + glute set 2x10 sec Sidelying clamshell 2x10 Standing glute med iso against wall 10x3 sec R&L Standing glute max iso against wall 10x3 sec R&L Manual Therapy: L hip LAD STM & TPR L hip flexors Skilled  assessment and palpation for TPDN Trigger Point Dry-Needling  Treatment instructions: Expect mild to moderate muscle soreness. S/S of pneumothorax if dry needled over a lung field, and to seek immediate medical attention should they occur. Patient verbalized understanding of these instructions and education.  Patient Consent Given: Yes Education handout provided: Previously provided Muscles treated: L psoas and TFL Electrical stimulation performed: No Parameters: N/A Treatment response/outcome: Twitch response illicited, decreased muscle tension Gait: Pregait: staggered stance working on ant/posterior weight shift maintaining hip extension x10 Fwd amb by counter x 3 working on maintaining trunk/hip extension and stance phase Bwd amb by counter x 3 working on glute activation and hip extension    PATIENT EDUCATION:  Education details:aquatic therapy intro Person educated: Patient Education method: Programmer, multimedia, Facilities manager, Education comprehension: verbalized understanding and returned demonstration  HOME EXERCISE PROGRAM: Access Code: [email or web address] URL: [email or web address] Date: 03/06/2023 Prepared by: Corlis Leak  Program Notes standing facing wall press hips forward toward wall hold 10 or more seconds repeat with left foot slightly behind R foot back to wall holding chair as needed for balance, place one foot flat on wall behind you with knee bent press foot into wall hold 3 sec x 10 right/left standing straight and tall squeeze buttock tight hold and repeat several times during the day practice walking at counter forwards and backwards - stay tall, hip first, core tight   Exercises - Supine Pelvic Tilt  - 1 x daily - 7 x weekly - 2 sets - 10 reps - Supine Lower Trunk Rotation  - 1 x daily - 7 x weekly - 1-2 sets - 5 reps - 10 sec hold - Supine Bridge  - 1 x daily - 7 x weekly - 3 sets - 10 reps - Right Standing Lateral Shift Correction at Wall - Hold  - 2 x daily - 7  x weekly - 1 sets - 10 reps - 5 sec hold - Standing Lumbar Extension at Wall - Forearms  - 2 x daily - 7 x weekly - 2 sets - 10 reps - Standing Hip Extension with Counter Support  - 1 x daily - 7 x weekly - 2 sets - 10 reps - Sidelying TFL Stretch  - 1 x daily - 7 x weekly - 2 sets - 10 reps - Clamshell  - 1 x daily - 7 x weekly - 2 sets - 10 reps  - Dry Needling info - Aquatic PT - what to expect  ASSESSMENT:  CLINICAL IMPRESSION: Positive response to last aquatic session. Pt reported increase  in pain to 3/10 with increased step height while walking backward; relieved in relaxed squat at wall. Improved tolerance with side stepping.  Pt observed moving at a quicker pace than last session, where she was more guarded.  Pain remained 1-3/10 during session in low back and Lt lateral leg.  Will plan to progress as tolerated.  Goals are ongoing.    GOALS: Goals reviewed with patient? Yes  SHORT TERM GOALS: Target date: 03/15/2023   Patient will be independent with initial HEP.  Baseline:  Goal status: INITIAL  2.  Patient will report centralization of radicular symptoms.  Baseline:  Goal status: INITIAL  3.  No evidence of lateral shift Baseline:  Goal status: INITIAL   LONG TERM GOALS: Target date: 04/12/2023   Patient will be independent with advanced/ongoing HEP to improve outcomes and carryover.  Baseline:  Goal status: INITIAL  2.  Patient will report 75% improvement in low back and leg pain to improve QOL.  Baseline:  Goal status: INITIAL  3.  Patient will demonstrate functional pain free lumbar ROM to perform ADLs.   Baseline:  Goal status: INITIAL  4.  Patient will demonstrate improved hip strength to 5/5 to normalize gait, transfers and bed mobility. Baseline:  Goal status: INITIAL  5.  Patient will report 25/50 on Modified Oswestry to demonstrate improved functional ability.  Baseline: 31 / 50 = 62.0  Goal status: INITIAL   6.  Patient able to return to a  walking program without pain provocation Baseline:  Goal status: INITIAL   PLAN:  PT FREQUENCY: 2x/week  PT DURATION: 8 weeks  PLANNED INTERVENTIONS: Therapeutic exercises, Therapeutic activity, Neuromuscular re-education, Balance training, Gait training, Patient/Family education, Self Care, Joint mobilization, Aquatic Therapy, Dry Needling, Electrical stimulation, Spinal mobilization, Cryotherapy, Moist heat, Taping, Traction, and Manual therapy.  PLAN FOR NEXT SESSION: DN to Left gluteals, lumbar, iliopsoas/hip flexors; assess lateral shift, work on lumbar ROM and try to get extension protocol started. Work on core and hip strengthening.  Mayer Camel, PTA 03/16/23 1:38 PM [facility] [address] [place], [place], [address] Phone: [phone]   Fax:  [phone]

## 2023-03-21 ENCOUNTER — Ambulatory Visit (HOSPITAL_BASED_OUTPATIENT_CLINIC_OR_DEPARTMENT_OTHER): Payer: Medicare HMO | Attending: Family Medicine | Admitting: Physical Therapy

## 2023-03-21 ENCOUNTER — Encounter (HOSPITAL_BASED_OUTPATIENT_CLINIC_OR_DEPARTMENT_OTHER): Payer: Self-pay | Admitting: Physical Therapy

## 2023-03-21 DIAGNOSIS — R2689 Other abnormalities of gait and mobility: Secondary | ICD-10-CM | POA: Diagnosis not present

## 2023-03-21 DIAGNOSIS — M6281 Muscle weakness (generalized): Secondary | ICD-10-CM | POA: Diagnosis not present

## 2023-03-21 DIAGNOSIS — M5416 Radiculopathy, lumbar region: Secondary | ICD-10-CM | POA: Diagnosis not present

## 2023-03-21 DIAGNOSIS — R252 Cramp and spasm: Secondary | ICD-10-CM | POA: Insufficient documentation

## 2023-03-21 NOTE — Therapy (Signed)
OUTPATIENT PHYSICAL THERAPY THORACOLUMBAR TREATMENT   Patient Name: Robin Christensen MRN: [id number] DOB:[birth year], 72 y.o., female Today's Date: 03/21/2023  END OF SESSION:  PT End of Session - 03/21/23 0829     Visit Number 9    Date for PT Re-Evaluation 04/12/23    Authorization - Number of Visits 12    Progress Note Due on Visit 10    PT Start Time 0815    PT Stop Time 0855    PT Time Calculation (min) 40 min    Activity Tolerance Patient tolerated treatment well    Behavior During Therapy WFL for tasks assessed/performed              Past Medical History:  Diagnosis Date   A-fib (HCC)    B12 deficiency    Back pain    Breast cancer (HCC)    Cancer (HCC)    Right Breast Cancer   Chronic headaches    Dysrhythmia    AF   Edema, lower extremity    Gallbladder disease    GERD (gastroesophageal reflux disease)    Hyperlipidemia    Hypertension    Hypothyroidism    Joint pain    Obesity    Pneumonia    PONV (postoperative nausea and vomiting)    Sleep apnea    cpcap   Vitamin deficiency    Past Surgical History:  Procedure Laterality Date   BREAST SURGERY     CARDIOVERSION N/A 06/26/2014   Procedure: CARDIOVERSION;  Surgeon: Pamella Pert, MD;  Location: [facility] ENDOSCOPY;  Service: Cardiovascular;  Laterality: N/A;   CATARACT EXTRACTION     CHOLECYSTECTOMY     DILATION AND CURETTAGE OF UTERUS     eyelid surgery     TOTAL KNEE ARTHROPLASTY Right 05/03/2016   Procedure: TOTAL KNEE ARTHROPLASTY;  Surgeon: Marcene Corning, MD;  Location: [facility] OR;  Service: Orthopedics;  Laterality: Right;   TOTAL KNEE ARTHROPLASTY Left 08/14/2018   Procedure: TOTAL KNEE ARTHROPLASTY;  Surgeon: Marcene Corning, MD;  Location: [facility] OR;  Service: Orthopedics;  Laterality: Left;   Patient Active Problem List   Diagnosis Date Noted   Other fatigue increased 06/01/2020   Atrial fibrillation (HCC) 04/23/2020   Vitamin D deficiency 04/08/2020   Hyperglycemia 03/12/2020    Essential hypertension 03/12/2020   Mixed hyperlipidemia 01/14/2020   Lower extremity edema 01/14/2020   Depression 01/14/2020   Class 1 obesity with serious comorbidity and body mass index (BMI) of 34.0 to 34.9 in adult 01/14/2020   Primary osteoarthritis of left knee 08/14/2018   Primary localized osteoarthritis of right knee 05/03/2016   Primary osteoarthritis of right knee 05/03/2016   Abnormal auditory perception of both ears 12/01/2015    PCP: Farris Has, MD   REFERRING PROVIDER: Farris Has, MD   REFERRING DIAG: M25.50 (ICD-10-CM) - Pain in unspecified joint   Rationale for Evaluation and Treatment: Rehabilitation  THERAPY DIAG:  Radiculopathy, lumbar region  Cramp and spasm  Other abnormalities of gait and mobility  Muscle weakness (generalized)  ONSET DATE: 2020 progressively worsening in past 2-4 mos.  SUBJECTIVE:  SUBJECTIVE STATEMENT: "I'm miserable".  Pt reports she went to pool with friend this weekend and was in water (deep end) for 1 hr, treading etc.  After 1 hr nap following pool, she had to [employer].  This increased her pain up to "12/10"  PERTINENT HISTORY:  B TKR, AFIB,  From eval: Pain is worsening in the low back and left hip down her leg. Severe pain in L anterior hip x 3-4 months.  Will be seeing pain management also. On Eliquis so can't take much. Does go to [facility] intermittently, but has increased pain if does 30 min. Weakness in L LE with gait causing limp.  PAIN:  Are you having pain? Yes: NPRS scale: 1/10 Pain location: low back/sacrum Pain description: ache sometimes sharp Aggravating factors: walk too fast of too big of steps, lying down Relieving factors: sitting in comfortable chair x 45 min    PRECAUTIONS: None   WEIGHT BEARING RESTRICTIONS:  No  FALLS:  Has patient fallen in last 6 months? 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 Mild lumbar spine spondylosis as described above. 2. No acute osseous injury of the lumbar spine.  PATIENT SURVEYS:  Modified Oswestry 31 / 50 = 62.0   MUSCLE LENGTH: NT  POSTURE:  left lateral shift; tight R lumbar  PALPATION: Marked pain in R gluteals and lumbar  LUMBAR ROM:   AROM eval  Flexion Hands to knees  Extension 0  Right lateral flexion 25%  Left lateral flexion About 5 deg  Right rotation 25%  Left rotation 25%   (Blank rows = not tested)  LOWER EXTREMITY MMT:     MMT  Right eval Left eval  Hip flexion 4+ 4  Hip extension 4 4  Hip abduction 4 5  Hip adduction 4+ 4+  Knee flexion 4+ 4+  Knee extension 5 5  Ankle dorsiflexion 5 5   (Blank rows = not tested)  LOWER EXTREMITY ROM:  WFL   LUMBAR SPECIAL TESTS: + hip scour L  FUNCTIONAL TESTS: unable to complete sit to stand without UE support  GAIT: Distance walked: 30 Assistive device utilized: None Level of assistance: Complete Independence Comments: antalgic, decreased stance time L   [facility] Adult PT Treatment:                                                DATE: 03/21/23 Pt seen for aquatic therapy today.  Treatment took place in water 3.5-4.75 ft in depth at the [facility] pool. Temp of water was 91.  Pt entered/exited the pool via stairs independently with bilat rail in step-to pattern.  * unsupported -Walking forward/ backward/ side stepping  (relaxed squat in between laps  as needed for pain relief) - cues for even step length * Holding wall: Lt hip ext to toe touch x 10, 2 sec paused * straddling noodle with UE on wall:  cycling, hip abdct/  addct, cross country ski * return to walking forward/ backward  * quad stretch L/R/L with foot on 2nd step x 15s * STS from bench in water with feet on blue step x 5 - with cues to maintain lumbar curve, core engaged, forward arm reach, slow controlled descent   [facility] Adult PT Treatment:                                                DATE: 03/16/23 Pt seen for aquatic therapy today.  Treatment took place in water 3.5-4.75 ft in depth at the [facility] pool. Temp of water was 91.  Pt entered/exited the pool via stairs independently with bilat rail in step-to pattern.  * unsupported -Walking forward/ backward/ side stepping  (relaxed squat in between laps as needed for pain relief) * Holding wall: heel /toe raises x 15, leg swings into hip flex/ext 3 sets x 5 * return to walking forward/ backward  * straddling noodle with UE on wall:  cycling, hip abdct/ addct, cross country ski * vertical relaxed squat UE On wall x 6 * quad stretch L/R/L with foot on 2nd step x 15s   [facility] Adult PT Treatment:                                                DATE: 03/14/23 Pt seen for aquatic therapy today.  Treatment took place in water 3.5-4.75 ft in depth at the [facility] pool. Temp of water was 91.  Pt entered/exited the pool via stairs independently with bilat rail in step-to pattern.  * unsupported -Walking forward/ backward/ side stepping  (relaxed squat in between laps as needed for pain relief) * marching with row motion with arms - UE on rainbow hand floats * standard stance with arm addct with rainbow hand floats x 10 * Holding wall: heel raises x 10, hip abdct/ addct 2 x 5; leg swings into hip flex/ext x 10; Lt hip ext x10 * Seated on bench: fig 4 with LLE for hip stretch   [facility] Adult PT Treatment:                                                 DATE: 03/06/23 Therapeutic Exercise: Nustep L6 x 7 min UEs/LEs Glut set prone 10 sec x 10  Lumbar extension against wall 2x30 sec Glute set with hands up on wall 2x10 Sit to stand hinged hip core tight x 5  Standing facing wall trunk extension; glut set 10 sec x 5 Standing facing wall hip flexor stretch L with L foot behind R  2x30 sec Supine hip flexor stretch L x 2; R x 1 LE off EOB x 30 sec VC for core activation and breathing  Standing glute med iso against wall 10x3 sec R&L holding chair for balance  Standing glute max iso against wall 10x3 sec R&L Manual Therapy: L hip IR//ER windmill  STM & TPR L hip flexors Skilled assessment and palpation for TPDN Trigger Point Dry-Needling  Treatment instructions: Expect mild to moderate muscle soreness. S/S of pneumothorax if  dry needled over a lung field, and to seek immediate medical attention should they occur. Patient verbalized understanding of these instructions and education.  Patient Consent Given: Yes Education handout provided: Previously provided Muscles treated: L lower lumbar paraspinals; piriformis; gluts  Electrical stimulation performed: Yes Parameters: mAmp current; intensity adjusted to patient tolerance  Treatment response/outcome: Twitch response illicited, decreased muscle tension Gait: Pregait: staggered stance working on ant/posterior weight shift maintaining hip extension x10 Fwd amb 40 feet working on maintaining trunk/hip extension and stance phase Fed/Bwd amb by counter x 4 working on glute activation and hip extension  [facility] Adult PT Treatment:                                                DATE: 03/02/23 Therapeutic Exercise: Nustep L5 x 5 min UEs/LEs Lumbar extension against wall 2x30 sec Glute set with hands up on wall 2x10 Standing hip flexor stretch 2x30 sec Sitting hip adductor stretch x 30 sec  Supine PPT + hip flexor stretch off EOB x30 sec Supine PPT + glute set 2x10  sec Sidelying clamshell 2x10 Standing glute med iso against wall 10x3 sec R&L Standing glute max iso against wall 10x3 sec R&L Manual Therapy: L hip LAD STM & TPR L hip flexors Skilled assessment and palpation for TPDN Trigger Point Dry-Needling  Treatment instructions: Expect mild to moderate muscle soreness. S/S of pneumothorax if dry needled over a lung field, and to seek immediate medical attention should they occur. Patient verbalized understanding of these instructions and education.  Patient Consent Given: Yes Education handout provided: Previously provided Muscles treated: L psoas and TFL Electrical stimulation performed: No Parameters: N/A Treatment response/outcome: Twitch response illicited, decreased muscle tension Gait: Pregait: staggered stance working on ant/posterior weight shift maintaining hip extension x10 Fwd amb by counter x 3 working on maintaining trunk/hip extension and stance phase Bwd amb by counter x 3 working on glute activation and hip extension    PATIENT EDUCATION:  Education details:aquatic therapy intro Person educated: Patient Education method: Programmer, multimedia, Facilities manager, Education comprehension: verbalized understanding and returned demonstration  HOME EXERCISE PROGRAM: Access Code: [email or web address] URL: [email or web address] Date: 03/06/2023 Prepared by: Corlis Leak  Program Notes standing facing wall press hips forward toward wall hold 10 or more seconds repeat with left foot slightly behind R foot back to wall holding chair as needed for balance, place one foot flat on wall behind you with knee bent press foot into wall hold 3 sec x 10 right/left standing straight and tall squeeze buttock tight hold and repeat several times during the day practice walking at counter forwards and backwards - stay tall, hip first, core tight   Exercises - Supine Pelvic Tilt  - 1 x daily - 7 x weekly - 2 sets - 10 reps - Supine Lower Trunk Rotation  - 1  x daily - 7 x weekly - 1-2 sets - 5 reps - 10 sec hold - Supine Bridge  - 1 x daily - 7 x weekly - 3 sets - 10 reps - Right Standing Lateral Shift Correction at Wall - Hold  - 2 x daily - 7 x weekly - 1 sets - 10 reps - 5 sec hold - Standing Lumbar Extension at Wall - Forearms  - 2 x daily - 7 x weekly - 2 sets - 10 reps - Standing Hip Extension with  Counter Support  - 1 x daily - 7 x weekly - 2 sets - 10 reps - Sidelying TFL Stretch  - 1 x daily - 7 x weekly - 2 sets - 10 reps - Clamshell  - 1 x daily - 7 x weekly - 2 sets - 10 reps  - Dry Needling info - Aquatic PT - what to expect  ASSESSMENT:  CLINICAL IMPRESSION: Pt arrived with elevated pain due to mowing lawn yesterday.  Pt observed moving more guarded, slower pace, likely due to elevated pain level. Pain in Lt posterior hip/ lateral thigh gradually decreased to 2/10 during session.   Will plan to progress as tolerated. May need additional visits for more exposure to aquatic exercises that are appropriate for her prior to her d/c to HEP.   PT to complete progress note next visit.    GOALS: Goals reviewed with patient? Yes  SHORT TERM GOALS: Target date: 03/15/2023   Patient will be independent with initial HEP.  Baseline:  Goal status: INITIAL  2.  Patient will report centralization of radicular symptoms.  Baseline:  Goal status: INITIAL  3.  No evidence of lateral shift Baseline:  Goal status: INITIAL   LONG TERM GOALS: Target date: 04/12/2023   Patient will be independent with advanced/ongoing HEP to improve outcomes and carryover.  Baseline:  Goal status: INITIAL  2.  Patient will report 75% improvement in low back and leg pain to improve QOL.  Baseline:  Goal status: INITIAL  3.  Patient will demonstrate functional pain free lumbar ROM to perform ADLs.   Baseline:  Goal status: INITIAL  4.  Patient will demonstrate improved hip strength to 5/5 to normalize gait, transfers and bed mobility. Baseline:  Goal  status: INITIAL  5.  Patient will report 25/50 on Modified Oswestry to demonstrate improved functional ability.  Baseline: 31 / 50 = 62.0  Goal status: INITIAL   6.  Patient able to return to a walking program without pain provocation Baseline:  Goal status: INITIAL   PLAN:  PT FREQUENCY: 2x/week  PT DURATION: 8 weeks  PLANNED INTERVENTIONS: Therapeutic exercises, Therapeutic activity, Neuromuscular re-education, Balance training, Gait training, Patient/Family education, Self Care, Joint mobilization, Aquatic Therapy, Dry Needling, Electrical stimulation, Spinal mobilization, Cryotherapy, Moist heat, Taping, Traction, and Manual therapy.  PLAN FOR NEXT SESSION: DN to Left gluteals, lumbar, iliopsoas/hip flexors; assess lateral shift, work on lumbar ROM and try to get extension protocol started. Work on core and hip strengthening.   Mayer Camel, PTA 03/21/23 9:00 AM [facility] [address] [place], [place], [address] Phone: [phone]   Fax:  [phone]

## 2023-03-21 NOTE — Therapy (Signed)
OUTPATIENT PHYSICAL THERAPY THORACOLUMBAR TREATMENT AND 10TH VISIT PROGRESS NOTE  Reporting Period 02/15/23 to 03/22/23   See note below for Objective Data and Assessment of Progress/Goals.    Patient Name: Robin Christensen MRN: [id number] DOB:1951/07/18, 72 y.o., female Today's Date: 03/22/2023  END OF SESSION:  PT End of Session - 03/22/23 0841     Visit Number 10    Date for PT Re-Evaluation 04/12/23    Authorization Type [employer] MCR    Authorization - Visit Number 9    Authorization - Number of Visits 12    Progress Note Due on Visit 20    PT Start Time 0842    PT Stop Time 0929    PT Time Calculation (min) 47 min    Activity Tolerance Patient tolerated treatment well    Behavior During Therapy WFL for tasks assessed/performed               Past Medical History:  Diagnosis Date   A-fib (HCC)    B12 deficiency    Back pain    Breast cancer (HCC)    Cancer (HCC)    Right Breast Cancer   Chronic headaches    Dysrhythmia    AF   Edema, lower extremity    Gallbladder disease    GERD (gastroesophageal reflux disease)    Hyperlipidemia    Hypertension    Hypothyroidism    Joint pain    Obesity    Pneumonia    PONV (postoperative nausea and vomiting)    Sleep apnea    cpcap   Vitamin deficiency    Past Surgical History:  Procedure Laterality Date   BREAST SURGERY     CARDIOVERSION N/A 06/26/2014   Procedure: CARDIOVERSION;  Surgeon: Pamella Pert, MD;  Location: [facility] ENDOSCOPY;  Service: Cardiovascular;  Laterality: N/A;   CATARACT EXTRACTION     CHOLECYSTECTOMY     DILATION AND CURETTAGE OF UTERUS     eyelid surgery     TOTAL KNEE ARTHROPLASTY Right 05/03/2016   Procedure: TOTAL KNEE ARTHROPLASTY;  Surgeon: Marcene Corning, MD;  Location: [facility] OR;  Service: Orthopedics;  Laterality: Right;   TOTAL KNEE ARTHROPLASTY Left 08/14/2018   Procedure: TOTAL KNEE ARTHROPLASTY;  Surgeon: Marcene Corning, MD;  Location: [facility] OR;  Service: Orthopedics;  Laterality:  Left;   Patient Active Problem List   Diagnosis Date Noted   Other fatigue increased 06/01/2020   Atrial fibrillation (HCC) 04/23/2020   Vitamin D deficiency 04/08/2020   Hyperglycemia 03/12/2020   Essential hypertension 03/12/2020   Mixed hyperlipidemia 01/14/2020   Lower extremity edema 01/14/2020   Depression 01/14/2020   Class 1 obesity with serious comorbidity and body mass index (BMI) of 34.0 to 34.9 in adult 01/14/2020   Primary osteoarthritis of left knee 08/14/2018   Primary localized osteoarthritis of right knee 05/03/2016   Primary osteoarthritis of right knee 05/03/2016   Abnormal auditory perception of both ears 12/01/2015    PCP: Farris Has, MD   REFERRING PROVIDER: Farris Has, MD   REFERRING DIAG: M25.50 (ICD-10-CM) - Pain in unspecified joint   Rationale for Evaluation and Treatment: Rehabilitation  THERAPY DIAG:  Radiculopathy, lumbar region  Cramp and spasm  Other abnormalities of gait and mobility  Muscle weakness (generalized)  Chronic right-sided low back pain with right-sided sciatica  ONSET DATE: 2020 progressively worsening in past 2-4 mos.  SUBJECTIVE:  SUBJECTIVE STATEMENT: I was feeling great Monday and then I mowed the lawn and my back was about a 12/10.  I feel it right in my low back. Some in my leg. Still goes to mid left shin. It has never gone away completely, but I could live with it at a 2-3/10. Sleeping is still a problems even with using pillows and support. I really felt like I was making improvements until I did the mowing.   PERTINENT HISTORY:  B TKR, AFIB,  From eval: Pain is worsening in the low back and left hip down her leg. Severe pain in L anterior hip x 3-4 months.  Will be seeing pain management also. On Eliquis so can't take much.  Does go to [facility] intermittently, but has increased pain if does 30 min. Weakness in L LE with gait causing limp.  PAIN:  Are you having pain? Yes: NPRS scale: 3/10 Pain location: low back/sacrum Pain description: ache sometimes sharp Aggravating factors: walk too fast of too big of steps, lying down Relieving factors: sitting in comfortable chair x 45 min    PRECAUTIONS: None   WEIGHT BEARING RESTRICTIONS: No  FALLS:  Has patient fallen in last 6 months? 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 Mild lumbar spine spondylosis as described above. 2. No acute osseous injury of the lumbar spine.  PATIENT SURVEYS:  Modified Oswestry 31 / 50 = 62.0  03/22/23: 30 / 50 = 60.0 %  MUSCLE LENGTH: NT  POSTURE:  left lateral shift; tight R lumbar  PALPATION: Marked pain in R gluteals and lumbar  LUMBAR ROM:   AROM eval 9/4  Flexion Hands to knees Hands to knees  Extension 0  10 deg  Right lateral flexion 25% To mid thigh  Left lateral flexion About 5 deg To mid thigh (less smooth)  Right rotation 25% 75%  Left rotation 25% 75%   (Blank rows = not tested)  LOWER EXTREMITY MMT:     MMT  Right eval Left eval Right 9/4 Left  9/4  Hip flexion 4+ 4 5 5   Hip extension 4 4 5  4+  Hip abduction 4 5 5 5   Hip adduction 4+ 4+ 5 4+  Knee flexion 4+ 4+ 5 4  Knee extension 5 5 5 5   Ankle dorsiflexion 5 5 5 5    (Blank rows = not tested)  LOWER EXTREMITY ROM:  WFL   LUMBAR SPECIAL TESTS: + hip scour L  FUNCTIONAL TESTS: unable to complete sit to stand without  UE support  GAIT: Distance walked: 30 Assistive device utilized: None Level of assistance: Complete Independence Comments: antalgic, decreased stance time L  [facility] Adult PT Treatment:                                                DATE: 03/22/23 Therapeutic Exercise: Nustep L6 x 7 min UEs/Les (aggravates back some) R side to wall  lateral shift 15 sec hold x 10 Functional squats in hinge position  x 5  Forward bend in hinge position to tolerable range x 3 (cues to engage HS/glutes for return to stand) - pt needs hands on thighs today Therapeutic Activities: Modified Oswestry MMT ROM Manual Therapy: Skilled assessment and palpation for TPDN. STM to L gluteals Trigger Point Dry-Needling  Treatment instructions: Expect mild to moderate muscle soreness. S/S of pneumothorax if dry needled over a lung field, and to seek immediate medical attention should they occur. Patient verbalized understanding of these instructions and education. Patient Consent Given: Yes Education handout provided: Previously provided Muscles treated: L gluteals, piriformis Electrical stimulation performed: No Parameters: N/A Treatment response/outcome: Twitch Response Elicited and Palpable Increase in Muscle Length    [facility] Adult PT Treatment:                                                DATE: 03/21/23 Pt seen for aquatic therapy today.  Treatment took place in water 3.5-4.75 ft in depth at the [facility] pool. Temp of water was 91.  Pt entered/exited the pool via stairs independently with bilat rail in step-to pattern.  * unsupported -Walking forward/ backward/ side stepping  (relaxed squat in between laps as needed for pain relief) - cues for even step length * Holding wall: Lt hip ext to toe touch x 10, 2 sec paused * straddling noodle with UE on wall:  cycling, hip abdct/ addct, cross country ski * return to walking forward/ backward  * quad stretch L/R/L with foot on 2nd step x 15s * STS from bench  in water with feet on blue step x 5 - with cues to maintain lumbar curve, core engaged, forward arm reach, slow controlled descent   [facility] Adult PT Treatment:                                                DATE: 03/16/23 Pt seen for aquatic therapy today.  Treatment took place in water 3.5-4.75 ft in depth at the [facility] pool. Temp of water was 91.  Pt entered/exited the pool via stairs independently with bilat rail in step-to pattern.  * unsupported -Walking forward/ backward/ side stepping  (relaxed squat in between laps as needed for pain relief) * Holding wall: heel /toe raises x 15, leg swings into hip flex/ext 3 sets x 5 * return to walking forward/ backward  * straddling noodle with UE on wall:  cycling, hip abdct/ addct, cross country ski * vertical relaxed squat UE On wall x 6 * quad stretch L/R/L with foot on 2nd step x 15s   [facility] Adult PT Treatment:                                                DATE: 03/14/23 Pt seen for aquatic therapy today.  Treatment took place in water 3.5-4.75 ft in depth at the [facility] pool. Temp of water was 91.  Pt entered/exited the pool via stairs independently with bilat rail in step-to pattern.  * unsupported -Walking forward/ backward/  side stepping  (relaxed squat in between laps as needed for pain relief) * marching with row motion with arms - UE on rainbow hand floats * standard stance with arm addct with rainbow hand floats x 10 * Holding wall: heel raises x 10, hip abdct/ addct 2 x 5; leg swings into hip flex/ext x 10; Lt hip ext x10 * Seated on bench: fig 4 with LLE for hip stretch   [facility] Adult PT Treatment:                                                DATE: 03/06/23 Therapeutic Exercise: Nustep L6 x 7 min UEs/LEs Glut set prone 10 sec x 10  Lumbar extension against wall 2x30 sec Glute set with hands up on wall 2x10 Sit to stand hinged hip core tight x 5  Standing facing wall trunk extension; glut set 10 sec x  5 Standing facing wall hip flexor stretch L with L foot behind R  2x30 sec Supine hip flexor stretch L x 2; R x 1 LE off EOB x 30 sec VC for core activation and breathing  Standing glute med iso against wall 10x3 sec R&L holding chair for balance  Standing glute max iso against wall 10x3 sec R&L Manual Therapy: L hip IR//ER windmill  STM & TPR L hip flexors Skilled assessment and palpation for TPDN Trigger Point Dry-Needling  Treatment instructions: Expect mild to moderate muscle soreness. S/S of pneumothorax if dry needled over a lung field, and to seek immediate medical attention should they occur. Patient verbalized understanding of these instructions and education.  Patient Consent Given: Yes Education handout provided: Previously provided Muscles treated: L lower lumbar paraspinals; piriformis; gluts  Electrical stimulation performed: Yes Parameters: mAmp current; intensity adjusted to patient tolerance  Treatment response/outcome: Twitch response illicited, decreased muscle tension Gait: Pregait: staggered stance working on ant/posterior weight shift maintaining hip extension x10 Fwd amb 40 feet working on maintaining trunk/hip extension and stance phase Fed/Bwd amb by counter x 4 working on glute activation and hip extension  [facility] Adult PT Treatment:                                                DATE: 03/02/23 Therapeutic Exercise: Nustep L5 x 5 min UEs/LEs Lumbar extension against wall 2x30 sec Glute set with hands up on wall 2x10 Standing hip flexor stretch 2x30 sec Sitting hip adductor stretch x 30 sec  Supine PPT + hip flexor stretch off EOB x30 sec Supine PPT + glute set 2x10 sec Sidelying clamshell 2x10 Standing glute med iso against wall 10x3 sec R&L Standing glute max iso against wall 10x3 sec R&L Manual Therapy: L hip LAD STM & TPR L hip flexors Skilled assessment and palpation for TPDN Trigger Point Dry-Needling  Treatment instructions: Expect mild to moderate  muscle soreness. S/S of pneumothorax if dry needled over a lung field, and to seek immediate medical attention should they occur. Patient verbalized understanding of these instructions and education.  Patient Consent Given: Yes Education handout provided: Previously provided Muscles treated: L psoas and TFL Electrical stimulation performed: No Parameters: N/A Treatment response/outcome: Twitch response illicited, decreased muscle tension Gait: Pregait: staggered stance working on ant/posterior weight  shift maintaining hip extension x10 Fwd amb by counter x 3 working on maintaining trunk/hip extension and stance phase Bwd amb by counter x 3 working on glute activation and hip extension    PATIENT EDUCATION:  Education details:aquatic therapy intro Person educated: Patient Education method: Programmer, multimedia, Facilities manager, Education comprehension: verbalized understanding and returned demonstration  HOME EXERCISE PROGRAM: Access Code: [email or web address] URL: [email or web address] Date: 03/06/2023 Prepared by: Corlis Leak  Program Notes standing facing wall press hips forward toward wall hold 10 or more seconds repeat with left foot slightly behind R foot back to wall holding chair as needed for balance, place one foot flat on wall behind you with knee bent press foot into wall hold 3 sec x 10 right/left standing straight and tall squeeze buttock tight hold and repeat several times during the day practice walking at counter forwards and backwards - stay tall, hip first, core tight   Exercises - Supine Pelvic Tilt  - 1 x daily - 7 x weekly - 2 sets - 10 reps - Supine Lower Trunk Rotation  - 1 x daily - 7 x weekly - 1-2 sets - 5 reps - 10 sec hold - Supine Bridge  - 1 x daily - 7 x weekly - 3 sets - 10 reps - Right Standing Lateral Shift Correction at Wall - Hold  - 2 x daily - 7 x weekly - 1 sets - 10 reps - 5 sec hold - Standing Lumbar Extension at Wall - Forearms  - 2 x daily - 7 x  weekly - 2 sets - 10 reps - Standing Hip Extension with Counter Support  - 1 x daily - 7 x weekly - 2 sets - 10 reps - Sidelying TFL Stretch  - 1 x daily - 7 x weekly - 2 sets - 10 reps - Clamshell  - 1 x daily - 7 x weekly - 2 sets - 10 reps  - Dry Needling info - Aquatic PT - what to expect  ASSESSMENT:  CLINICAL IMPRESSION: ANNASTYN INGLES reports she was 50% better until Monday when she mowed her very tall grass and flared her back pain up again. Her pain has gone down to 3/10 today from 12/10 Monday. She has marked TTP in Left gluteals and lumbar and responded very well to DN/MT today. Despite her flare up, she has made considerable gains in lumbar ROM and LE strength. She continues to demonstrate a R lateral shift and has marked pain with flexion activities. She also continues to have L radicular pain to her mid shin. Dennie Bible has two more aquatic visits before her POC is up, but based on her flare up and her status of goals I recommend she continue PT 2x/wk for 4-6 wks with 2:1 aquatic/land visits beyond that date. She continues to demonstrate potential for improvement and would benefit from continued skilled therapy to address impairments.   GOALS: Goals reviewed with patient? Yes  SHORT TERM GOALS: Target date: 03/15/2023   Patient will be independent with initial HEP.  Baseline:  Goal status: IN PROGRESS pretty consistent per pt  2.  Patient will report centralization of radicular symptoms.  Baseline:  Goal status: IN PROGRESS 03/22/23  3.  No evidence of lateral shift Baseline:  Goal status: IN PROGRESS 03/22/23   LONG TERM GOALS: Target date: 04/12/2023   Patient will be independent with advanced/ongoing HEP to improve outcomes and carryover.  Baseline:  Goal status: IN PROGRESS  2.  Patient will report 75% improvement in  low back and leg pain to improve QOL.  Baseline:  Goal status: IN PROGRESS 50% until last Monday  3.  Patient will demonstrate functional pain free  lumbar ROM to perform ADLs.   Baseline:  Goal status: IN PROGRESS still limited in flexion - see objective chart  4.  Patient will demonstrate improved hip strength to 5/5 to normalize gait, transfers and bed mobility. Baseline:  Goal status: IN PROGRESS 9/4 see objective chart  5.  Patient will report 25/50 on Modified Oswestry to demonstrate improved functional ability.  Baseline: 31 / 50 = 62.0  Goal status: IN PROGRESS 03/22/23: 30/50  6.  Patient able to return to a walking program without pain provocation Baseline:  Goal status: IN PROGRESS Has been going to the [facility] 3x/wk in addition to PT   PLAN:  PT FREQUENCY: 2x/week  PT DURATION: 8 weeks  PLANNED INTERVENTIONS: Therapeutic exercises, Therapeutic activity, Neuromuscular re-education, Balance training, Gait training, Patient/Family education, Self Care, Joint mobilization, Aquatic Therapy, Dry Needling, Electrical stimulation, Spinal mobilization, Cryotherapy, Moist heat, Taping, Traction, and Manual therapy.  PLAN FOR NEXT SESSION: Assess DN to Left gluteals, continue to address lateral shift, work on functional lumbar ROM - squats, dead lifts, continue extension protocol as tolerated. Work on core and hip strengthening. Will need recert after 9/25 [facility] reauth sent 03/22/23)   Solon Palm, PT  03/22/23 11:45 AM [facility] [address] [place], [place], [address] Phone: [phone]   Fax:  [phone]

## 2023-03-22 ENCOUNTER — Ambulatory Visit: Payer: Medicare HMO | Attending: Family Medicine | Admitting: Physical Therapy

## 2023-03-22 ENCOUNTER — Encounter: Payer: Self-pay | Admitting: Physical Therapy

## 2023-03-22 DIAGNOSIS — M5441 Lumbago with sciatica, right side: Secondary | ICD-10-CM | POA: Diagnosis not present

## 2023-03-22 DIAGNOSIS — M62838 Other muscle spasm: Secondary | ICD-10-CM | POA: Diagnosis not present

## 2023-03-22 DIAGNOSIS — R252 Cramp and spasm: Secondary | ICD-10-CM | POA: Insufficient documentation

## 2023-03-22 DIAGNOSIS — M25651 Stiffness of right hip, not elsewhere classified: Secondary | ICD-10-CM | POA: Insufficient documentation

## 2023-03-22 DIAGNOSIS — M5416 Radiculopathy, lumbar region: Secondary | ICD-10-CM | POA: Diagnosis not present

## 2023-03-22 DIAGNOSIS — G8929 Other chronic pain: Secondary | ICD-10-CM | POA: Diagnosis not present

## 2023-03-22 DIAGNOSIS — M25551 Pain in right hip: Secondary | ICD-10-CM | POA: Insufficient documentation

## 2023-03-22 DIAGNOSIS — R2689 Other abnormalities of gait and mobility: Secondary | ICD-10-CM | POA: Diagnosis not present

## 2023-03-22 DIAGNOSIS — M6281 Muscle weakness (generalized): Secondary | ICD-10-CM | POA: Diagnosis not present

## 2023-03-23 ENCOUNTER — Ambulatory Visit (HOSPITAL_BASED_OUTPATIENT_CLINIC_OR_DEPARTMENT_OTHER): Payer: Medicare HMO | Admitting: Physical Therapy

## 2023-03-28 ENCOUNTER — Encounter (HOSPITAL_BASED_OUTPATIENT_CLINIC_OR_DEPARTMENT_OTHER): Payer: Self-pay | Admitting: Physical Therapy

## 2023-03-28 ENCOUNTER — Ambulatory Visit (HOSPITAL_BASED_OUTPATIENT_CLINIC_OR_DEPARTMENT_OTHER): Payer: Medicare HMO | Admitting: Physical Therapy

## 2023-03-28 DIAGNOSIS — M6281 Muscle weakness (generalized): Secondary | ICD-10-CM | POA: Diagnosis not present

## 2023-03-28 DIAGNOSIS — R252 Cramp and spasm: Secondary | ICD-10-CM

## 2023-03-28 DIAGNOSIS — M5416 Radiculopathy, lumbar region: Secondary | ICD-10-CM

## 2023-03-28 DIAGNOSIS — R2689 Other abnormalities of gait and mobility: Secondary | ICD-10-CM | POA: Diagnosis not present

## 2023-03-28 NOTE — Therapy (Signed)
OUTPATIENT PHYSICAL THERAPY THORACOLUMBAR TREATMENT   Patient Name: Robin Christensen MRN: [id number] DOB:02-22-1951, 72 y.o., female Today's Date: 03/28/2023  END OF SESSION:  PT End of Session - 03/28/23 0818     Visit Number 11    Date for PT Re-Evaluation 04/12/23    Authorization Type [employer] MCR    Authorization - Number of Visits 12    Progress Note Due on Visit 20    PT Start Time 0815    PT Stop Time 0855    PT Time Calculation (min) 40 min    Behavior During Therapy WFL for tasks assessed/performed               Past Medical History:  Diagnosis Date   A-fib (HCC)    B12 deficiency    Back pain    Breast cancer (HCC)    Cancer (HCC)    Right Breast Cancer   Chronic headaches    Dysrhythmia    AF   Edema, lower extremity    Gallbladder disease    GERD (gastroesophageal reflux disease)    Hyperlipidemia    Hypertension    Hypothyroidism    Joint pain    Obesity    Pneumonia    PONV (postoperative nausea and vomiting)    Sleep apnea    cpcap   Vitamin deficiency    Past Surgical History:  Procedure Laterality Date   BREAST SURGERY     CARDIOVERSION N/A 06/26/2014   Procedure: CARDIOVERSION;  Surgeon: Pamella Pert, MD;  Location: [facility] ENDOSCOPY;  Service: Cardiovascular;  Laterality: N/A;   CATARACT EXTRACTION     CHOLECYSTECTOMY     DILATION AND CURETTAGE OF UTERUS     eyelid surgery     TOTAL KNEE ARTHROPLASTY Right 05/03/2016   Procedure: TOTAL KNEE ARTHROPLASTY;  Surgeon: Marcene Corning, MD;  Location: [facility] OR;  Service: Orthopedics;  Laterality: Right;   TOTAL KNEE ARTHROPLASTY Left 08/14/2018   Procedure: TOTAL KNEE ARTHROPLASTY;  Surgeon: Marcene Corning, MD;  Location: [facility] OR;  Service: Orthopedics;  Laterality: Left;   Patient Active Problem List   Diagnosis Date Noted   Other fatigue increased 06/01/2020   Atrial fibrillation (HCC) 04/23/2020   Vitamin D deficiency 04/08/2020   Hyperglycemia 03/12/2020   Essential hypertension  03/12/2020   Mixed hyperlipidemia 01/14/2020   Lower extremity edema 01/14/2020   Depression 01/14/2020   Class 1 obesity with serious comorbidity and body mass index (BMI) of 34.0 to 34.9 in adult 01/14/2020   Primary osteoarthritis of left knee 08/14/2018   Primary localized osteoarthritis of right knee 05/03/2016   Primary osteoarthritis of right knee 05/03/2016   Abnormal auditory perception of both ears 12/01/2015    PCP: Farris Has, MD   REFERRING PROVIDER: Farris Has, MD   REFERRING DIAG: M25.50 (ICD-10-CM) - Pain in unspecified joint   Rationale for Evaluation and Treatment: Rehabilitation  THERAPY DIAG:  Radiculopathy, lumbar region  Cramp and spasm  Other abnormalities of gait and mobility  Muscle weakness (generalized)  ONSET DATE: 2020 progressively worsening in past 2-4 mos.  SUBJECTIVE:  SUBJECTIVE STATEMENT: Pt reports that the day following last land session, her pain intensified and she had to use her walker.  She went to pool yesterday with friend for 1 hr and her pain "pretty much went away" afterwards.  She expected to be in pain this morning, but pain was minimal.    PERTINENT HISTORY:  B TKR, AFIB,  From eval: Pain is worsening in the low back and left hip down her leg. Severe pain in L anterior hip x 3-4 months.  Will be seeing pain management also. On Eliquis so can't take much. Does go to [facility] intermittently, but has increased pain if does 30 min. Weakness in L LE with gait causing limp.  PAIN:  Are you having pain? Yes: NPRS scale: 1/10 Pain location: low back/sacrum Pain description: ache sometimes sharp Aggravating factors: walk too fast of too big of steps, lying down Relieving factors: sitting in comfortable chair x 45 min    PRECAUTIONS:  None   WEIGHT BEARING RESTRICTIONS: No  FALLS:  Has patient fallen in last 6 months? 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 Mild lumbar spine spondylosis as described above. 2. No acute osseous injury of the lumbar spine.  PATIENT SURVEYS:  Modified Oswestry 31 / 50 = 62.0  03/22/23: 30 / 50 = 60.0 %  MUSCLE LENGTH: NT  POSTURE:  left lateral shift; tight R lumbar  PALPATION: Marked pain in R gluteals and lumbar  LUMBAR ROM:   AROM eval 9/4  Flexion Hands to knees Hands to knees  Extension 0  10 deg  Right lateral flexion 25% To mid thigh  Left lateral flexion About 5 deg To mid thigh (less smooth)  Right rotation 25% 75%  Left rotation 25% 75%   (Blank rows = not tested)  LOWER EXTREMITY MMT:     MMT  Right eval Left eval Right 9/4 Left  9/4  Hip flexion 4+ 4 5 5   Hip extension 4 4 5  4+  Hip abduction 4 5 5 5   Hip adduction 4+ 4+ 5 4+  Knee flexion 4+ 4+ 5 4  Knee extension 5 5 5 5   Ankle dorsiflexion 5 5 5 5    (Blank rows = not tested)  LOWER EXTREMITY ROM:  WFL   LUMBAR SPECIAL TESTS: + hip scour L  FUNCTIONAL TESTS: unable to complete sit to stand without UE support  GAIT: Distance walked: 30 Assistive device utilized: None Level of assistance: Complete Independence Comments: antalgic, decreased stance time L   [facility] Adult PT Treatment:                                                DATE: 03/28/23 Pt seen for aquatic therapy today.  Treatment took place in water 3.5-4.75 ft in depth at the [facility] pool. Temp of  water was 91.  Pt entered/exited the pool via stairs independently with bilat rail in  step-to pattern.  * unsupported -Walking forward/ backward/ side stepping  (relaxed squat in between laps as needed for pain relief) - cues for even step length * side stepping with arm addct with rainbow hand floats - 1 lap  * Holding wall: alternating, R/Lt hip ext to toe touch x 10, 2 sec paused * marching forward/ backward with reciprocal row with hand floats;  farmer carry with bilat rainbow hand floats under water walking forward/ backward * straddling noodle with UE on wall:  cycling, hip abdct/ addct, cross country ski - with yellow hand floats near wall - cycling    [facility] Adult PT Treatment:                                                DATE: 03/22/23 Therapeutic Exercise: Nustep L6 x 7 min UEs/Les (aggravates back some) R side to wall lateral shift 15 sec hold x 10 Functional squats in hinge position  x 5  Forward bend in hinge position to tolerable range x 3 (cues to engage HS/glutes for return to stand) - pt needs hands on thighs today Therapeutic Activities: Modified Oswestry MMT ROM Manual Therapy: Skilled assessment and palpation for TPDN. STM to L gluteals Trigger Point Dry-Needling  Treatment instructions: Expect mild to moderate muscle soreness. S/S of pneumothorax if dry needled over a lung field, and to seek immediate medical attention should they occur. Patient verbalized understanding of these instructions and education. Patient Consent Given: Yes Education handout provided: Previously provided Muscles treated: L gluteals, piriformis Electrical stimulation performed: No Parameters: N/A Treatment response/outcome: Twitch Response Elicited and Palpable Increase in Muscle Length    [facility] Adult PT Treatment:                                                DATE: 03/21/23 Pt seen for aquatic therapy today.  Treatment took place in water 3.5-4.75 ft in depth at the [facility] pool.  Temp of water was 91.  Pt entered/exited the pool via stairs independently with bilat rail in step-to pattern.  * unsupported -Walking forward/ backward/ side stepping  (relaxed squat in between laps as needed for pain relief) - cues for even step length * Holding wall: Lt hip ext to toe touch x 10, 2 sec paused * straddling noodle with UE on wall:  cycling, hip abdct/ addct, cross country ski * return to walking forward/ backward  * quad stretch L/R/L with foot on 2nd step x 15s * STS from bench in water with feet on blue step x 5 - with cues to maintain lumbar curve, core engaged, forward arm reach, slow controlled descent   PATIENT EDUCATION:  Education details:aquatic therapy progressions/ modifications Person educated: Patient Education method: Programmer, multimedia, Facilities manager, Education comprehension: verbalized understanding and returned demonstration  HOME EXERCISE PROGRAM: Access Code: [email or web address] URL: [email or web address] Date: 03/06/2023 Prepared by: Corlis Leak  Program Notes standing facing wall press hips forward toward wall hold 10 or more seconds repeat with left foot slightly behind R foot back to wall holding chair as needed for balance, place one foot flat on wall behind you with knee bent press foot into wall hold 3 sec x 10 right/left standing straight and tall squeeze buttock tight  hold and repeat several times during the day practice walking at counter forwards and backwards - stay tall, hip first, core tight   Exercises - Supine Pelvic Tilt  - 1 x daily - 7 x weekly - 2 sets - 10 reps - Supine Lower Trunk Rotation  - 1 x daily - 7 x weekly - 1-2 sets - 5 reps - 10 sec hold - Supine Bridge  - 1 x daily - 7 x weekly - 3 sets - 10 reps - Right Standing Lateral Shift Correction at Wall - Hold  - 2 x daily - 7 x weekly - 1 sets - 10 reps - 5 sec hold - Standing Lumbar Extension at Wall - Forearms  - 2 x daily - 7 x weekly - 2 sets - 10 reps - Standing Hip  Extension with Counter Support  - 1 x daily - 7 x weekly - 2 sets - 10 reps - Sidelying TFL Stretch  - 1 x daily - 7 x weekly - 2 sets - 10 reps - Clamshell  - 1 x daily - 7 x weekly - 2 sets - 10 reps  - Dry Needling info - Aquatic PT - what to expect  Access Code: [email or web address] URL: [email or web address] Date: 03/28/2023 Prepared by: [facility] * not issued yet    ASSESSMENT:  CLINICAL IMPRESSION: DAQUISHA KOVALCIK has had good relief of pain with aquatic exercises thus far. She is given cues to listen to body and dial back intensity or rest when needed.  Pain reduced to 0/10 during session.  She is encouraged to continue going to community pool outside of therapy sessions and will plan to issue HEP next visit.    PT from previous land session recommended she continue PT 2x/wk for 4-6 wks with 2:1 aquatic/land visits beyond that date. She continues to demonstrate potential for improvement and would benefit from continued skilled therapy to address impairments.   GOALS: Goals reviewed with patient? Yes  SHORT TERM GOALS: Target date: 03/15/2023   Patient will be independent with initial HEP.  Baseline:  Goal status: IN PROGRESS pretty consistent per pt  2.  Patient will report centralization of radicular symptoms.  Baseline:  Goal status: IN PROGRESS 03/22/23  3.  No evidence of lateral shift Baseline:  Goal status: IN PROGRESS 03/22/23   LONG TERM GOALS: Target date: 04/12/2023   Patient will be independent with advanced/ongoing HEP to improve outcomes and carryover.  Baseline:  Goal status: IN PROGRESS  2.  Patient will report 75% improvement in low back and leg pain to improve QOL.  Baseline:  Goal status: IN PROGRESS 50% until last Monday  3.  Patient will demonstrate functional pain free lumbar ROM to perform ADLs.   Baseline:  Goal status: IN PROGRESS still limited in flexion - see objective chart  4.  Patient will  demonstrate improved hip strength to 5/5 to normalize gait, transfers and bed mobility. Baseline:  Goal status: IN PROGRESS 9/4 see objective chart  5.  Patient will report 25/50 on Modified Oswestry to demonstrate improved functional ability.  Baseline: 31 / 50 = 62.0  Goal status: IN PROGRESS 03/22/23: 30/50  6.  Patient able to return to a walking program without pain provocation Baseline:  Goal status: IN PROGRESS Has been going to the [facility] 3x/wk in addition to PT   PLAN:  PT FREQUENCY: 2x/week  PT DURATION: 8 weeks  PLANNED INTERVENTIONS: Therapeutic exercises, Therapeutic activity, Neuromuscular  re-education, Balance training, Gait training, Patient/Family education, Self Care, Joint mobilization, Aquatic Therapy, Dry Needling, Electrical stimulation, Spinal mobilization, Cryotherapy, Moist heat, Taping, Traction, and Manual therapy.  PLAN FOR NEXT SESSION: Assess DN to Left gluteals, continue to address lateral shift, work on functional lumbar ROM - squats, dead lifts, continue extension protocol as tolerated. Work on core and hip strengthening. Will need recert after 9/25 [facility] reauth sent 03/22/23)  Mayer Camel, PTA 03/28/23 1:58 PM [facility] [address] [place], [place], [address] Phone: [phone]   Fax:  [phone]

## 2023-03-30 ENCOUNTER — Encounter (HOSPITAL_BASED_OUTPATIENT_CLINIC_OR_DEPARTMENT_OTHER): Payer: Self-pay | Admitting: Physical Therapy

## 2023-03-30 ENCOUNTER — Ambulatory Visit (HOSPITAL_BASED_OUTPATIENT_CLINIC_OR_DEPARTMENT_OTHER): Payer: Medicare HMO | Admitting: Physical Therapy

## 2023-03-30 DIAGNOSIS — M6281 Muscle weakness (generalized): Secondary | ICD-10-CM | POA: Diagnosis not present

## 2023-03-30 DIAGNOSIS — R252 Cramp and spasm: Secondary | ICD-10-CM | POA: Diagnosis not present

## 2023-03-30 DIAGNOSIS — M5416 Radiculopathy, lumbar region: Secondary | ICD-10-CM | POA: Diagnosis not present

## 2023-03-30 DIAGNOSIS — R2689 Other abnormalities of gait and mobility: Secondary | ICD-10-CM

## 2023-03-30 NOTE — Therapy (Signed)
OUTPATIENT PHYSICAL THERAPY THORACOLUMBAR TREATMENT   Patient Name: Robin Christensen MRN: [id number] DOB:[birth year], 72 y.o., female Today's Date: 03/30/2023  END OF SESSION:  PT End of Session - 03/30/23 0830     Visit Number 12    Date for PT Re-Evaluation 04/12/23    Authorization Type [employer] MCR    Authorization - Visit Number 10    Authorization - Number of Visits 12    Progress Note Due on Visit 20    PT Start Time 0815    PT Stop Time 0855    PT Time Calculation (min) 40 min    Activity Tolerance Patient tolerated treatment well    Behavior During Therapy WFL for tasks assessed/performed                Past Medical History:  Diagnosis Date   A-fib (HCC)    B12 deficiency    Back pain    Breast cancer (HCC)    Cancer (HCC)    Right Breast Cancer   Chronic headaches    Dysrhythmia    AF   Edema, lower extremity    Gallbladder disease    GERD (gastroesophageal reflux disease)    Hyperlipidemia    Hypertension    Hypothyroidism    Joint pain    Obesity    Pneumonia    PONV (postoperative nausea and vomiting)    Sleep apnea    cpcap   Vitamin deficiency    Past Surgical History:  Procedure Laterality Date   BREAST SURGERY     CARDIOVERSION N/A 06/26/2014   Procedure: CARDIOVERSION;  Surgeon: Pamella Pert, MD;  Location: [facility] ENDOSCOPY;  Service: Cardiovascular;  Laterality: N/A;   CATARACT EXTRACTION     CHOLECYSTECTOMY     DILATION AND CURETTAGE OF UTERUS     eyelid surgery     TOTAL KNEE ARTHROPLASTY Right 05/03/2016   Procedure: TOTAL KNEE ARTHROPLASTY;  Surgeon: Marcene Corning, MD;  Location: [facility] OR;  Service: Orthopedics;  Laterality: Right;   TOTAL KNEE ARTHROPLASTY Left 08/14/2018   Procedure: TOTAL KNEE ARTHROPLASTY;  Surgeon: Marcene Corning, MD;  Location: [facility] OR;  Service: Orthopedics;  Laterality: Left;   Patient Active Problem List   Diagnosis Date Noted   Other fatigue increased 06/01/2020   Atrial fibrillation (HCC) 04/23/2020    Vitamin D deficiency 04/08/2020   Hyperglycemia 03/12/2020   Essential hypertension 03/12/2020   Mixed hyperlipidemia 01/14/2020   Lower extremity edema 01/14/2020   Depression 01/14/2020   Class 1 obesity with serious comorbidity and body mass index (BMI) of 34.0 to 34.9 in adult 01/14/2020   Primary osteoarthritis of left knee 08/14/2018   Primary localized osteoarthritis of right knee 05/03/2016   Primary osteoarthritis of right knee 05/03/2016   Abnormal auditory perception of both ears 12/01/2015    PCP: Farris Has, MD   REFERRING PROVIDER: Farris Has, MD   REFERRING DIAG: M25.50 (ICD-10-CM) - Pain in unspecified joint   Rationale for Evaluation and Treatment: Rehabilitation  THERAPY DIAG:  Radiculopathy, lumbar region  Cramp and spasm  Other abnormalities of gait and mobility  Muscle weakness (generalized)  ONSET DATE: 2020 progressively worsening in past 2-4 mos.  SUBJECTIVE:  SUBJECTIVE STATEMENT: Pt reports that she went to pool at [facility], but didn't have as much relief as last time.  Plans to go to [facility] with friend.     PERTINENT HISTORY:  B TKR, AFIB,  From eval: Pain is worsening in the low back and left hip down her leg. Severe pain in L anterior hip x 3-4 months.  Will be seeing pain management also. On Eliquis so can't take much. Does go to [facility] intermittently, but has increased pain if does 30 min. Weakness in L LE with gait causing limp.  PAIN:  Are you having pain? Yes: NPRS scale: 3/10 Pain location: low back/sacrum Pain description: ache sometimes sharp Aggravating factors: walk too fast of too big of steps, lying down Relieving factors: sitting in comfortable chair x 45 min    PRECAUTIONS: None   WEIGHT BEARING RESTRICTIONS: No  FALLS:  Has patient  fallen in last 6 months? 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 Mild lumbar spine spondylosis as described above. 2. No acute osseous injury of the lumbar spine.  PATIENT SURVEYS:  Modified Oswestry 31 / 50 = 62.0  03/22/23: 30 / 50 = 60.0 %  MUSCLE LENGTH: NT  POSTURE:  left lateral shift; tight R lumbar  PALPATION: Marked pain in R gluteals and lumbar  LUMBAR ROM:   AROM eval 9/4  Flexion Hands to knees Hands to knees  Extension 0  10 deg  Right lateral flexion 25% To mid thigh  Left lateral flexion About 5 deg To mid thigh (less smooth)  Right rotation 25% 75%  Left rotation 25% 75%   (Blank rows = not tested)  LOWER EXTREMITY MMT:     MMT  Right eval Left eval Right 9/4 Left  9/4  Hip flexion 4+ 4 5 5   Hip extension 4 4 5  4+  Hip abduction 4 5 5 5   Hip adduction 4+ 4+ 5 4+  Knee flexion 4+ 4+ 5 4  Knee extension 5 5 5 5   Ankle dorsiflexion 5 5 5 5    (Blank rows = not tested)  LOWER EXTREMITY ROM:  WFL   LUMBAR SPECIAL TESTS: + hip scour L  FUNCTIONAL TESTS: unable to complete sit to stand without UE support  GAIT: Distance walked: 30 Assistive device utilized: None Level of assistance: Complete Independence Comments: antalgic, decreased stance time L  [facility] Adult PT Treatment:                                                DATE: 03/30/23 Pt seen for aquatic therapy today.  Treatment took place in water 3.5-4.75 ft in depth at the [facility] pool. Temp of water was 91.  Pt entered/exited the pool via stairs independently  with bilat rail in step-to pattern.  * unsupported -Walking forward/ backward/ side stepping  - cues for even step length * straddling noodle with UE on yellow hand  floats:  cycling 3 slow laps  * side stepping with arm addct with rainbow hand floats - 2 laps  * Holding wall: alternating, R/Lt hip ext to toe touch x 5, 2 sets;  hip abdct/ addct 2 sets of 5 * marching forward/ backward with reciprocal row with hand floats;  farmer carry with bilat rainbow hand floats under water walking forward/ backward * hamstring stretch with foot on 2nd step, 15s x 2 each LE;  quad stretch with foot on 2nd step behind her, 15s x 2 reps each LE   [facility] Adult PT Treatment:                                                DATE: 03/28/23 Pt seen for aquatic therapy today.  Treatment took place in water 3.5-4.75 ft in depth at the [facility] pool. Temp of water was 91.  Pt entered/exited the pool via stairs independently with bilat rail in step-to pattern.  * unsupported -Walking forward/ backward/ side stepping  (relaxed squat in between laps as needed for pain relief) - cues for even step length * side stepping with arm addct with rainbow hand floats - 1 lap  * Holding wall: alternating, R/Lt hip ext to toe touch x 10, 2 sec paused * marching forward/ backward with reciprocal row with hand floats;  farmer carry with bilat rainbow hand floats under water walking forward/ backward * straddling noodle with UE on wall:  cycling, hip abdct/ addct, cross country ski - with yellow hand floats near wall - cycling    [facility] Adult PT Treatment:                                                DATE: 03/22/23 Therapeutic Exercise: Nustep L6 x 7 min UEs/Les (aggravates back some) R side to wall lateral shift 15 sec hold x 10 Functional squats in hinge position  x 5  Forward bend in hinge position to tolerable range x 3 (cues to engage HS/glutes for return to stand) - pt needs hands on thighs today Therapeutic  Activities: Modified Oswestry MMT ROM Manual Therapy: Skilled assessment and palpation for TPDN. STM to L gluteals Trigger Point Dry-Needling  Treatment instructions: Expect mild to moderate muscle soreness. S/S of pneumothorax if dry needled over a lung field, and to seek immediate medical attention should they occur. Patient verbalized understanding of these instructions and education. Patient Consent Given: Yes Education handout provided: Previously provided Muscles treated: L gluteals, piriformis Electrical stimulation performed: No Parameters: N/A Treatment response/outcome: Twitch Response Elicited and Palpable Increase in Muscle Length    [facility] Adult PT Treatment:                                                DATE: 03/21/23 Pt seen for aquatic therapy today.  Treatment took place in water 3.5-4.75 ft in depth at the [facility] pool. Temp of water was 91.  Pt entered/exited the pool via stairs independently with bilat rail in step-to pattern.  * unsupported -Walking forward/ backward/ side stepping  (relaxed squat in  between laps as needed for pain relief) - cues for even step length * Holding wall: Lt hip ext to toe touch x 10, 2 sec paused * straddling noodle with UE on wall:  cycling, hip abdct/ addct, cross country ski * return to walking forward/ backward  * quad stretch L/R/L with foot on 2nd step x 15s * STS from bench in water with feet on blue step x 5 - with cues to maintain lumbar curve, core engaged, forward arm reach, slow controlled descent   PATIENT EDUCATION:  Education details:aquatic therapy progressions/ modifications Person educated: Patient Education method: Programmer, multimedia, Facilities manager, Education comprehension: verbalized understanding and returned demonstration  HOME EXERCISE PROGRAM: Access Code: [email or web address] URL: [email or web address] Date: 03/06/2023 Prepared by: Corlis Leak  Program Notes standing facing wall press hips forward  toward wall hold 10 or more seconds repeat with left foot slightly behind R foot back to wall holding chair as needed for balance, place one foot flat on wall behind you with knee bent press foot into wall hold 3 sec x 10 right/left standing straight and tall squeeze buttock tight hold and repeat several times during the day practice walking at counter forwards and backwards - stay tall, hip first, core tight   Exercises - Supine Pelvic Tilt  - 1 x daily - 7 x weekly - 2 sets - 10 reps - Supine Lower Trunk Rotation  - 1 x daily - 7 x weekly - 1-2 sets - 5 reps - 10 sec hold - Supine Bridge  - 1 x daily - 7 x weekly - 3 sets - 10 reps - Right Standing Lateral Shift Correction at Wall - Hold  - 2 x daily - 7 x weekly - 1 sets - 10 reps - 5 sec hold - Standing Lumbar Extension at Wall - Forearms  - 2 x daily - 7 x weekly - 2 sets - 10 reps - Standing Hip Extension with Counter Support  - 1 x daily - 7 x weekly - 2 sets - 10 reps - Sidelying TFL Stretch  - 1 x daily - 7 x weekly - 2 sets - 10 reps - Clamshell  - 1 x daily - 7 x weekly - 2 sets - 10 reps  - Dry Needling info - Aquatic PT - what to expect  Access Code: [email or web address] URL: [email or web address] Date: 03/28/2023 Prepared by: [facility] [address] This aquatic home exercise program from MedBridge utilizes pictures from land based exercises, but has been adapted prior to lamination and issuance.      ASSESSMENT:  CLINICAL IMPRESSION: Dallas Schimke  Issued and reviewed laminated HEP.  Good tolerance to exercise with pain reduced to 1/10 during session.  She is encouraged to continue going to community pool outside of therapy sessions and work through [employer]; return to next aquatic session with feedback.   PT from previous land session recommended she continue PT 2x/wk for 4-6 wks with 2:1 aquatic/land visits beyond that date. Pt may be ready to d/c from aquatic therapy sooner if independent with HEP  and symptoms managed.  She continues to demonstrate potential for improvement and would benefit from continued skilled therapy to address impairments.   GOALS: Goals reviewed with patient? Yes  SHORT TERM GOALS: Target date: 03/15/2023   Patient will be independent with initial HEP.  Baseline:  Goal status: IN PROGRESS pretty consistent per pt  2.  Patient will report centralization of radicular symptoms.  Baseline:  Goal status: IN  PROGRESS 03/22/23  3.  No evidence of lateral shift Baseline:  Goal status: IN PROGRESS 03/22/23   LONG TERM GOALS: Target date: 04/12/2023   Patient will be independent with advanced/ongoing HEP to improve outcomes and carryover.  Baseline:  Goal status: IN PROGRESS  2.  Patient will report 75% improvement in low back and leg pain to improve QOL.  Baseline:  Goal status: IN PROGRESS 50% until last Monday  3.  Patient will demonstrate functional pain free lumbar ROM to perform ADLs.   Baseline:  Goal status: IN PROGRESS still limited in flexion - see objective chart  4.  Patient will demonstrate improved hip strength to 5/5 to normalize gait, transfers and bed mobility. Baseline:  Goal status: IN PROGRESS 9/4 see objective chart  5.  Patient will report 25/50 on Modified Oswestry to demonstrate improved functional ability.  Baseline: 31 / 50 = 62.0  Goal status: IN PROGRESS 03/22/23: 30/50  6.  Patient able to return to a walking program without pain provocation Baseline:  Goal status: IN PROGRESS Has been going to the [facility] 3x/wk in addition to PT   PLAN:  PT FREQUENCY: 2x/week  PT DURATION: 8 weeks  PLANNED INTERVENTIONS: Therapeutic exercises, Therapeutic activity, Neuromuscular re-education, Balance training, Gait training, Patient/Family education, Self Care, Joint mobilization, Aquatic Therapy, Dry Needling, Electrical stimulation, Spinal mobilization, Cryotherapy, Moist heat, Taping, Traction, and Manual therapy.  PLAN FOR NEXT  SESSION: Assess DN to Left gluteals, continue to address lateral shift, work on functional lumbar ROM - squats, dead lifts, continue extension protocol as tolerated. Work on core and hip strengthening. Will need recert after 9/25 [facility] reauth sent 03/22/23)   Mayer Camel, PTA 03/30/23 10:14 AM [facility] [address] [place], [place], [address] Phone: [phone]   Fax:  [phone]

## 2023-04-04 ENCOUNTER — Ambulatory Visit: Payer: Medicare HMO | Admitting: Rehabilitative and Restorative Service Providers"

## 2023-04-04 DIAGNOSIS — E559 Vitamin D deficiency, unspecified: Secondary | ICD-10-CM | POA: Diagnosis not present

## 2023-04-04 DIAGNOSIS — E785 Hyperlipidemia, unspecified: Secondary | ICD-10-CM | POA: Diagnosis not present

## 2023-04-04 DIAGNOSIS — R7303 Prediabetes: Secondary | ICD-10-CM | POA: Diagnosis not present

## 2023-04-04 DIAGNOSIS — R632 Polyphagia: Secondary | ICD-10-CM | POA: Diagnosis not present

## 2023-04-04 DIAGNOSIS — Z6841 Body Mass Index (BMI) 40.0 and over, adult: Secondary | ICD-10-CM | POA: Diagnosis not present

## 2023-04-06 ENCOUNTER — Encounter: Payer: Self-pay | Admitting: Rehabilitative and Restorative Service Providers"

## 2023-04-06 ENCOUNTER — Ambulatory Visit: Payer: Medicare HMO | Admitting: Rehabilitative and Restorative Service Providers"

## 2023-04-06 DIAGNOSIS — M25551 Pain in right hip: Secondary | ICD-10-CM

## 2023-04-06 DIAGNOSIS — M25651 Stiffness of right hip, not elsewhere classified: Secondary | ICD-10-CM | POA: Diagnosis not present

## 2023-04-06 DIAGNOSIS — M6281 Muscle weakness (generalized): Secondary | ICD-10-CM

## 2023-04-06 DIAGNOSIS — M5416 Radiculopathy, lumbar region: Secondary | ICD-10-CM | POA: Diagnosis not present

## 2023-04-06 DIAGNOSIS — M5441 Lumbago with sciatica, right side: Secondary | ICD-10-CM | POA: Diagnosis not present

## 2023-04-06 DIAGNOSIS — R2689 Other abnormalities of gait and mobility: Secondary | ICD-10-CM | POA: Diagnosis not present

## 2023-04-06 DIAGNOSIS — R252 Cramp and spasm: Secondary | ICD-10-CM | POA: Diagnosis not present

## 2023-04-06 DIAGNOSIS — M62838 Other muscle spasm: Secondary | ICD-10-CM

## 2023-04-06 DIAGNOSIS — G8929 Other chronic pain: Secondary | ICD-10-CM | POA: Diagnosis not present

## 2023-04-06 NOTE — Therapy (Signed)
OUTPATIENT PHYSICAL THERAPY THORACOLUMBAR TREATMENT AND 10TH VISIT PROGRESS NOTE  Reporting Period 02/15/23 to 03/22/23   See note below for Objective Data and Assessment of Progress/Goals.    Patient Name: Robin Christensen MRN: [id number] DOB:Dec 14, 1950, 72 y.o., female Today's Date: 04/06/2023  END OF SESSION:  PT End of Session - 04/06/23 1021     Visit Number 13    Number of Visits 20   20 total   Date for PT Re-Evaluation 05/18/23    Authorization Type [employer] MCR    Authorization - Visit Number 13    Authorization - Number of Visits 16    Progress Note Due on Visit 20    PT Start Time 1015    PT Stop Time 1100    PT Time Calculation (min) 45 min    Activity Tolerance Patient tolerated treatment well               Past Medical History:  Diagnosis Date   A-fib (HCC)    B12 deficiency    Back pain    Breast cancer (HCC)    Cancer (HCC)    Right Breast Cancer   Chronic headaches    Dysrhythmia    AF   Edema, lower extremity    Gallbladder disease    GERD (gastroesophageal reflux disease)    Hyperlipidemia    Hypertension    Hypothyroidism    Joint pain    Obesity    Pneumonia    PONV (postoperative nausea and vomiting)    Sleep apnea    cpcap   Vitamin deficiency    Past Surgical History:  Procedure Laterality Date   BREAST SURGERY     CARDIOVERSION N/A 06/26/2014   Procedure: CARDIOVERSION;  Surgeon: Pamella Pert, MD;  Location: [facility] ENDOSCOPY;  Service: Cardiovascular;  Laterality: N/A;   CATARACT EXTRACTION     CHOLECYSTECTOMY     DILATION AND CURETTAGE OF UTERUS     eyelid surgery     TOTAL KNEE ARTHROPLASTY Right 05/03/2016   Procedure: TOTAL KNEE ARTHROPLASTY;  Surgeon: Marcene Corning, MD;  Location: [facility] OR;  Service: Orthopedics;  Laterality: Right;   TOTAL KNEE ARTHROPLASTY Left 08/14/2018   Procedure: TOTAL KNEE ARTHROPLASTY;  Surgeon: Marcene Corning, MD;  Location: [facility] OR;  Service: Orthopedics;  Laterality: Left;   Patient Active  Problem List   Diagnosis Date Noted   Other fatigue increased 06/01/2020   Atrial fibrillation (HCC) 04/23/2020   Vitamin D deficiency 04/08/2020   Hyperglycemia 03/12/2020   Essential hypertension 03/12/2020   Mixed hyperlipidemia 01/14/2020   Lower extremity edema 01/14/2020   Depression 01/14/2020   Class 1 obesity with serious comorbidity and body mass index (BMI) of 34.0 to 34.9 in adult 01/14/2020   Primary osteoarthritis of left knee 08/14/2018   Primary localized osteoarthritis of right knee 05/03/2016   Primary osteoarthritis of right knee 05/03/2016   Abnormal auditory perception of both ears 12/01/2015    PCP: Farris Has, MD   REFERRING PROVIDER: Farris Has, MD   REFERRING DIAG: M25.50 (ICD-10-CM) - Pain in unspecified joint   Rationale for Evaluation and Treatment: Rehabilitation  THERAPY DIAG:  Radiculopathy, lumbar region  Cramp and spasm  Other abnormalities of gait and mobility  Muscle weakness (generalized)  Chronic right-sided low back pain with right-sided sciatica  Pain in right hip  Stiffness of right hip, not elsewhere classified  Other muscle spasm  ONSET DATE: 2020 progressively worsening in past 2-4 mos.  SUBJECTIVE:  SUBJECTIVE STATEMENT: Patient reports that the pool exercises are going well. She is working to transition to [employer]. Continues to improve with less pain and she is increasing activities at home. She is not mowing her yard at this time. Sleeping is sometimes better than others. She is still using pillows and support.    PERTINENT HISTORY:  B TKR, AFIB,  From eval: Pain is worsening in the low back and left hip down her leg. Severe pain in L anterior hip x 3-4 months.  Will be seeing pain management also. On Eliquis so can't take  much. Does go to [facility] intermittently, but has increased pain if does 30 min. Weakness in L LE with gait causing limp.  PAIN:  Are you having pain? Yes: NPRS scale: 2-3/10 Pain location: low back/sacrum Pain description: ache sometimes sharp Aggravating factors: walk too fast of too big of steps, lying down Relieving factors: sitting in comfortable chair x 45 min    PRECAUTIONS: None   WEIGHT BEARING RESTRICTIONS: No  FALLS:  Has patient fallen in last 6 months? 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 Mild lumbar spine spondylosis as described above. 2. No acute osseous injury of the lumbar spine.  PATIENT SURVEYS:  Modified Oswestry 31 / 50 = 62.0  03/22/23: 30 / 50 = 60.0 %  MUSCLE LENGTH: NT  POSTURE:  left lateral shift; tight R lumbar  PALPATION: Marked pain in R gluteals and lumbar  LUMBAR ROM:   AROM eval 9/4 04/06/23  Flexion Hands to knees Hands to knees Hands to mid shin   Extension 0  10 deg 20 deg   Right lateral flexion 25% To mid thigh Fingers 2 in above knee  Left lateral flexion About 5 deg To mid thigh (less smooth) Fingers 4 in above knee   Right rotation 25% 75%   Left rotation 25% 75%    (Blank rows = not tested)  LOWER EXTREMITY MMT:     MMT  Right eval Left eval Right 9/4 Left  9/4 Right  9/19 Left 9/19  Hip flexion 4+ 4 5 5 5 5   Hip extension 4 4 5  4+ 5 4+  Hip abduction 4 5 5 5 5  4+  Hip adduction 4+ 4+ 5 4+ 5 5-  Knee flexion 4+ 4+ 5 4 5 5   Knee extension 5 5 5 5 5 5   Ankle dorsiflexion 5 5 5 5 5 5    (Blank  rows = not tested)  LOWER EXTREMITY ROM:  WFL   LUMBAR SPECIAL TESTS: + hip scour L  FUNCTIONAL TESTS: unable to complete sit to stand without UE support  GAIT: Distance walked: 30 Assistive device utilized: None Level of assistance: Complete Independence Comments: antalgic, decreased stance time L  [facility] Adult PT Treatment:  DATE: 04/06/23 Therapeutic Exercise: Nustep L6 x 7 min; L5 x 1 min  UEs/LEs Hip abduction L attempting to lead with heel 3 sec x 10 x 2  Bridging 3 sec x 10 partial range   Hip abduction alternating L/R green TB 3 sec x 10  Sit to stand from ~ 28 in surface x 10 chest up Functional squats in hinge position  x 5  Forward bend in hinge position to tolerable range x 3 (cues to engage HS/glutes for return to stand) - pt needs hands on thighs today Therapeutic Activities: MMT ROM    [facility] Adult PT Treatment:                                                DATE: 03/21/23 Pt seen for aquatic therapy today.  Treatment took place in water 3.5-4.75 ft in depth at the [facility] pool. Temp of water was 91.  Pt entered/exited the pool via stairs independently with bilat rail in step-to pattern.  * unsupported -Walking forward/ backward/ side stepping  (relaxed squat in between laps as needed for pain relief) - cues for even step length * Holding wall: Lt hip ext to toe touch x 10, 2 sec paused * straddling noodle with UE on wall:  cycling, hip abdct/ addct, cross country ski * return to walking forward/ backward  * quad stretch L/R/L with foot on 2nd step x 15s * STS from bench in water with feet on blue step x 5 - with cues to maintain lumbar curve, core engaged, forward arm reach, slow controlled descent   [facility] Adult PT Treatment:                                                DATE: 03/16/23 Pt seen for aquatic therapy today.  Treatment took place in water 3.5-4.75 ft in depth at the [facility] pool. Temp  of water was 91.  Pt entered/exited the pool via stairs independently with bilat rail in step-to pattern.  * unsupported -Walking forward/ backward/ side stepping  (relaxed squat in between laps as needed for pain relief) * Holding wall: heel /toe raises x 15, leg swings into hip flex/ext 3 sets x 5 * return to walking forward/ backward  * straddling noodle with UE on wall:  cycling, hip abdct/ addct, cross country ski * vertical relaxed squat UE On wall x 6 * quad stretch L/R/L with foot on 2nd step x 15s   [facility] Adult PT Treatment:                                                DATE: 03/14/23 Pt seen for aquatic therapy today.  Treatment took place in water 3.5-4.75 ft in depth at the [facility] pool. Temp of water was 91.  Pt entered/exited the pool via stairs independently with bilat rail in step-to pattern.  * unsupported -Walking forward/ backward/ side stepping  (relaxed squat in between laps as needed for pain relief) * marching with row motion with arms - UE on rainbow hand floats * standard stance  with arm addct with rainbow hand floats x 10 * Holding wall: heel raises x 10, hip abdct/ addct 2 x 5; leg swings into hip flex/ext x 10; Lt hip ext x10 * Seated on bench: fig 4 with LLE for hip stretch   [facility] Adult PT Treatment:        (dry needling)                                         DATE: 03/06/23 Therapeutic Exercise: Nustep L6 x 7 min UEs/LEs Glut set prone 10 sec x 10  Lumbar extension against wall 2x30 sec Glute set with hands up on wall 2x10 Sit to stand hinged hip core tight x 5  Standing facing wall trunk extension; glut set 10 sec x 5 Standing facing wall hip flexor stretch L with L foot behind R  2x30 sec Supine hip flexor stretch L x 2; R x 1 LE off EOB x 30 sec VC for core activation and breathing  Standing glute med iso against wall 10x3 sec R&L holding chair for balance  Standing glute max iso against wall 10x3 sec R&L Manual Therapy: L hip IR//ER  windmill  STM & TPR L hip flexors Skilled assessment and palpation for TPDN Trigger Point Dry-Needling  Treatment instructions: Expect mild to moderate muscle soreness. S/S of pneumothorax if dry needled over a lung field, and to seek immediate medical attention should they occur. Patient verbalized understanding of these instructions and education.  Patient Consent Given: Yes Education handout provided: Previously provided Muscles treated: L lower lumbar paraspinals; piriformis; gluts  Electrical stimulation performed: Yes Parameters: mAmp current; intensity adjusted to patient tolerance  Treatment response/outcome: Twitch response illicited, decreased muscle tension Gait: Pregait: staggered stance working on ant/posterior weight shift maintaining hip extension x10 Fwd amb 40 feet working on maintaining trunk/hip extension and stance phase Fed/Bwd amb by counter x 4 working on glute activation and hip extension   PATIENT EDUCATION:  Education details:aquatic therapy intro Person educated: Patient Education method: Programmer, multimedia, Facilities manager, Education comprehension: verbalized understanding and returned demonstration  HOME EXERCISE PROGRAM: Access Code: [email or web address] URL: [email or web address] Date: 04/06/2023 Prepared by: Corlis Leak  Program Notes standing facing wall press hips forward toward wall hold 10 or more seconds repeat with left foot slightly behind R foot back to wall holding chair as needed for balance, place one foot flat on wall behind you with knee bent press foot into wall hold 3 sec x 10 right/left standing straight and tall squeeze buttock tight hold and repeat several times during the day practice walking at counter forwards and backwards - stay tall, hip first, core tight   Exercises - Supine Pelvic Tilt  - 1 x daily - 7 x weekly - 2 sets - 10 reps - Supine Lower Trunk Rotation  - 1 x daily - 7 x weekly - 1-2 sets - 5 reps - 10 sec hold - Supine  Bridge  - 1 x daily - 7 x weekly - 3 sets - 10 reps - Right Standing Lateral Shift Correction at Wall - Hold  - 2 x daily - 7 x weekly - 1 sets - 10 reps - 5 sec hold - Standing Lumbar Extension at Wall - Forearms  - 2 x daily - 7 x weekly - 2 sets - 10 reps - Standing Hip Extension with Counter Support  - 1 x daily -  7 x weekly - 2 sets - 10 reps - Sidelying TFL Stretch  - 1 x daily - 7 x weekly - 2 sets - 10 reps - Clamshell  - 1 x daily - 7 x weekly - 2 sets - 10 reps - Hooklying Isometric Clamshell  - 2 x daily - 7 x weekly - 1 sets - 10 reps - 3 sec  hold - Sit to Stand  - 2 x daily - 7 x weekly - 1 sets - 10 reps - 3-5 sec  hold  - Dry Needling info - Aquatic PT - what to expect  ASSESSMENT:  CLINICAL IMPRESSION: Robin Christensen reports she was 75-80% better overall. She is continued to work on pool program with good improvement in exercise tolerance. Working to become more consistent in land based exercises. She has made continued gains in lumbar ROM and LE strength. She has no significant L radicular pain to her mid shin. Patient will benefit from continued treatment to reach maximum rehab potential and progress to full independence in HEP. Encouraged patient to work on some land based exercises each day.    GOALS: Goals reviewed with patient? Yes  SHORT TERM GOALS: Target date: 03/15/2023   Patient will be independent with initial HEP.  Baseline:  Goal status: met  2.  Patient will report centralization of radicular symptoms.  Baseline:  Goal status: met  3.  No evidence of lateral shift Baseline:  Goal status: met   LONG TERM GOALS: Target date: 04/12/2023   Patient will be independent with advanced/ongoing HEP to improve outcomes and carryover.  Baseline:  Goal status: IN PROGRESS  2.  Patient will report 75% improvement in low back and leg pain to improve QOL.  Baseline:  Goal status: met 75-80% now   3.  Patient will demonstrate functional pain free lumbar  ROM to perform ADLs.   Baseline:  Goal status: met   4.  Patient will demonstrate improved hip strength to 5/5 to normalize gait, transfers and bed mobility. Baseline:  Goal status: IN PROGRESS   5.  Patient will report 25/50 on Modified Oswestry to demonstrate improved functional ability.  Baseline: 31 / 50 = 62.0  Goal status: IN PROGRESS 03/22/23: 30/50  6.  Patient able to return to a walking program without pain provocation Baseline:  Goal status: IN PROGRESS Has been going to the [facility] 3x/wk in addition to PT   PLAN:  PT FREQUENCY: 2x/week  PT DURATION: 8 weeks  PLANNED INTERVENTIONS: Therapeutic exercises, Therapeutic activity, Neuromuscular re-education, Balance training, Gait training, Patient/Family education, Self Care, Joint mobilization, Aquatic Therapy, Dry Needling, Electrical stimulation, Spinal mobilization, Cryotherapy, Moist heat, Taping, Traction, and Manual therapy.  PLAN FOR NEXT SESSION: Assess DN to Left gluteals, continue to address lateral shift, work on functional lumbar ROM - squats, dead lifts, continue extension protocol as tolerated. Work on core and hip strengthening. Will need recert after 9/25 [facility] reauth sent 03/22/23)   Argusta Mcgann P. Leonor Liv PT, MPH 04/06/23 11:57 AM

## 2023-04-07 NOTE — Progress Notes (Signed)
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  We can increase to 100mg  later if needed. Headache rescue:  Extra-strength Tylenol with coffee Limit use of pain relievers to no more than 2 days out of week to prevent risk of rebound or medication-overuse headache. Keep headache diary Follow up with dentist for evaluation of possible TMJ dysfunction Follow up one year    Subjective:  Robin Christensen is a 72 year old right-handed female with hypothyroidism, hyperlipidemia, hypertension, OSA, atrial fibrillation status post cardioversion and history of breast cancer status post mastectomy who follows up for headache.   UPDATE: She reports increased headaches since 3 to 4 months ago.  Any change in emotion can trigger it, whether stress or feeling happy. Intensity:  They are now severe (feels like head is going to explode) Duration:  May last for hours Frequency:   2 days a week but may skip a week. Over the last year, has had about 3 episodes of vertigo. Current NSAIDS: None Current analgesics: Tylenol Arthritis (takes 3 times a day for her arthritis) Current triptans: None Current ergotamine: None Current anti-emetic: None Current muscle relaxants: None Current anti-anxiolytic: None Current sleep aide: None Current Antihypertensive medications: Lasix Current Antidepressant medications: none Current Anticonvulsant medications: zonisamide 50mg , gabapentin 300mg  QID (for back pain) Current anti-CGRP: None Current Vitamins/Herbal/Supplements: KCl, B12 Current Antihistamines/Decongestants: None Other therapy: None Other medications: levothyroxine, Eliqis   Caffeine: No Alcohol: No Smoker: No Diet: Hydrates Exercise: Walks 3 miles 5 days a week Depression: No;  Anxiety: No Other pain: Lumbar spinal stenosis.   Sleep hygiene: Sleeps 4 to 5 hours straight, wakes up for 2 hours and then sleeps for another 2 hours.  She has OSA which is successfully treated with CPAP.    HISTORY: Onset:  2018.  At the time, they were severe and daily.  It was found that her OSA was uncontrolled and CPAP was adjusted.  Headaches resolved but returned about 2 months ago, albeit less severe.  CPAP was rechecked and is adequate. Location:  Bifrontal/across top of head Quality:  pressure Initial intensity:  mild.  She denies new headache, thunderclap headache or severe headache that wakes her from sleep. Aura:  no Prodrome:  no Postdrome:  no Associated symptoms:  None. Initially some nausea a year ago.  Now without nausea, vomiting, photophobia, phonophobia, autonomic symptoms or visual disturbance.  She denies associated unilateral numbness or weakness. Initial Duration:  1 hour to all day Initial Frequency:  3 days a week Initial Frequency of abortive medication: Extra-strength Tylenol 3 days a week Triggers:  Emotional stress Relieving factors:  When occupied by other activities Activity:  Does not aggravate She endorses aching pain in her jaw, right worse than left.  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  She hasn't seen a dentist in years.  She has seen ophthalmology and had cataract surgery this year.  Vision is improved.     MRI of brain without and with contrast from 01/15/18 was personally reviewed and was unremarkable except for incidental partial empty sella.   Past NSAIDS: ibuprofen, naproxen Past analgesics:  no Past abortive triptans:  no Past muscle relaxants:  no Past anti-emetic:  no Past antihypertensive medications:  Toprol XL Past antidepressant medications:  Nortriptyline 10mg  (hunger/weight gain, sluggishness), Cymbalta  60mg  Past anticonvulsant medications:   Topiramate 25 mg at bedtime (discontinued due to cognitive  deficits) Past vitamins/Herbal/Supplements:  no Past antihistamines/decongestants:  no Other past therapies:  no   She has history of headaches off and on throughout her life.  Family history of headache:  No  PAST MEDICAL HISTORY: Past Medical History:  Diagnosis Date   A-fib (HCC)    B12 deficiency    Back pain    Breast cancer (HCC)    Cancer (HCC)    Right Breast Cancer   Chronic headaches    Dysrhythmia    AF   Edema, lower extremity    Gallbladder disease    GERD (gastroesophageal reflux disease)    Hyperlipidemia    Hypertension    Hypothyroidism    Joint pain    Obesity    Pneumonia    PONV (postoperative nausea and vomiting)    Sleep apnea    cpcap   Vitamin deficiency     MEDICATIONS: Current Outpatient Medications on File Prior to Visit  Medication Sig Dispense Refill   acetaminophen (TYLENOL) 500 MG tablet Take 500 mg by mouth 3 (three) times daily as needed for mild pain or headache.      buPROPion (WELLBUTRIN SR) 150 MG 12 hr tablet Take 1 tablet (150 mg total) by mouth daily. (Patient not taking: Reported on 02/15/2023) 30 tablet 0   CALCIUM-VITAMIN D PO Take 2 tablets by mouth every evening.     diphenhydrAMINE (BENADRYL) 50 MG capsule Take 50 mg by mouth every 6 (six) hours as needed.     ELIQUIS 5 MG TABS tablet TAKE 1 TABLET TWICE DAILY 180 tablet 0   furosemide (LASIX) 40 MG tablet Take 40 mg by mouth daily.     gabapentin (NEURONTIN) 300 MG capsule Take 300 mg by mouth 3 (three) times daily.     levothyroxine (SYNTHROID, LEVOTHROID) 75 MCG tablet Take 75 mcg by mouth daily before breakfast.     mirabegron ER (MYRBETRIQ) 50 MG TB24 tablet Take 50 mg by mouth daily.     potassium chloride SA (K-DUR,KLOR-CON) 20 MEQ tablet Take 20 mEq by mouth daily.      pravastatin (PRAVACHOL) 40 MG tablet Take 40 mg by mouth daily.      vitamin B-12 (CYANOCOBALAMIN) 1000 MCG tablet Take 1,000 mcg by mouth daily.     zonisamide (ZONEGRAN) 50 MG capsule Take 1  capsule (50 mg total) by mouth daily. 90 capsule 3   No current facility-administered medications on file prior to visit.    ALLERGIES: Allergies  Allergen Reactions   Xarelto [Rivaroxaban] Other (See Comments)    JOINT ACHES/PAIN    FAMILY HISTORY: Family History  Problem Relation Age of Onset   High blood pressure Mother    Heart disease Mother    Cancer Mother    Depression Mother    Obesity Mother    Heart disease Father    Kidney disease Father    Cancer Father       Objective:  Blood pressure 111/62, pulse (!) 56, height 5\' 8"  (1.727 m), weight 286 lb (129.7 kg), SpO2 94%. General: No acute distress.  Patient appears well-groomed.   Head:  Normocephalic/atraumatic.  Tenderness to right TMJ dysfunction Eyes:  Fundi examined but not visualized Neck: supple, no paraspinal tenderness, full range of motion Heart:  Regular rate and rhythm Neurological Exam: alert and oriented.  Speech fluent and not dysarthric, language intact.  CN II-XII intact. Bulk and tone  normal, muscle strength 5/5 throughout.  Sensation to light touch intact.  Deep tendon reflexes 2+ throughout.  Finger to nose testing intact.  Gait normal, Romberg negative.   Shon Millet, DO  CC: Farris Has, MD

## 2023-04-11 ENCOUNTER — Ambulatory Visit: Payer: Medicare HMO | Admitting: Rehabilitative and Restorative Service Providers"

## 2023-04-13 ENCOUNTER — Ambulatory Visit: Payer: Medicare HMO | Admitting: Rehabilitative and Restorative Service Providers"

## 2023-04-17 ENCOUNTER — Encounter: Payer: Self-pay | Admitting: Neurology

## 2023-04-17 ENCOUNTER — Ambulatory Visit: Payer: Medicare HMO | Admitting: Neurology

## 2023-04-17 VITALS — BP 111/62 | HR 56 | Ht 68.0 in | Wt 286.0 lb

## 2023-04-17 DIAGNOSIS — G44219 Episodic tension-type headache, not intractable: Secondary | ICD-10-CM | POA: Diagnosis not present

## 2023-04-17 DIAGNOSIS — R519 Headache, unspecified: Secondary | ICD-10-CM

## 2023-04-17 MED ORDER — ZONISAMIDE 25 MG PO CAPS: 75.0000 mg | ORAL_CAPSULE | Freq: Every day | ORAL | 5 refills | Status: AC

## 2023-04-17 NOTE — Patient Instructions (Signed)
Increase zonisamide to 75mg  daily (I sent in prescription for 25mg  pills - take 3 pills daily).  If no improvement in 2 months, contact me MRI of brain with and without contrast Follow up with dentist about possible right TMJ dysfunction Follow up 6 months.

## 2023-04-18 ENCOUNTER — Encounter (HOSPITAL_BASED_OUTPATIENT_CLINIC_OR_DEPARTMENT_OTHER): Payer: Self-pay | Admitting: Physical Therapy

## 2023-04-18 ENCOUNTER — Ambulatory Visit (HOSPITAL_BASED_OUTPATIENT_CLINIC_OR_DEPARTMENT_OTHER): Payer: Medicare HMO | Attending: Family Medicine | Admitting: Physical Therapy

## 2023-04-18 DIAGNOSIS — R252 Cramp and spasm: Secondary | ICD-10-CM | POA: Diagnosis not present

## 2023-04-18 DIAGNOSIS — M6281 Muscle weakness (generalized): Secondary | ICD-10-CM | POA: Diagnosis not present

## 2023-04-18 DIAGNOSIS — M5416 Radiculopathy, lumbar region: Secondary | ICD-10-CM | POA: Diagnosis not present

## 2023-04-18 DIAGNOSIS — R2689 Other abnormalities of gait and mobility: Secondary | ICD-10-CM | POA: Diagnosis not present

## 2023-04-18 NOTE — Therapy (Addendum)
OUTPATIENT PHYSICAL THERAPY THORACOLUMBAR TREATMENT AND DISCHARGE SUMMARY    PHYSICAL THERAPY DISCHARGE SUMMARY  Visits from Start of Care: 14  Current functional level related to goals / functional outcomes: See progress notes for discharge status    Remaining deficits: Continued intermittent pain - needs to work on strengthening and stabilization with aquatic program and land based exercises    Education / Equipment: HEP    Patient agrees to discharge. Patient goals were partially met. Patient is being discharged due to being pleased with the current functional level.  Celyn P. Leonor Liv PT, MPH 04/27/23 10:06 AM   Patient Name: Robin Christensen MRN: [id number] DOB:Aug 24, 1950, 72 y.o., female Today's Date: 04/18/2023  END OF SESSION:  PT End of Session - 04/18/23 0830     Visit Number 14    Number of Visits 20    Date for PT Re-Evaluation 05/18/23    Authorization Type [employer] MCR    Authorization Time Period 04/06/23-05/18/23    Authorization - Visit Number 2    Authorization - Number of Visits 8    Progress Note Due on Visit 20    PT Start Time 0818    PT Stop Time 0857    PT Time Calculation (min) 39 min    Activity Tolerance Patient tolerated treatment well    Behavior During Therapy WFL for tasks assessed/performed               Past Medical History:  Diagnosis Date   A-fib (HCC)    B12 deficiency    Back pain    Breast cancer (HCC)    Cancer (HCC)    Right Breast Cancer   Chronic headaches    Dysrhythmia    AF   Edema, lower extremity    Gallbladder disease    GERD (gastroesophageal reflux disease)    Hyperlipidemia    Hypertension    Hypothyroidism    Joint pain    Obesity    Pneumonia    PONV (postoperative nausea and vomiting)    Sleep apnea    cpcap   Vitamin deficiency    Past Surgical History:  Procedure Laterality Date   BREAST SURGERY     CARDIOVERSION N/A 06/26/2014   Procedure: CARDIOVERSION;  Surgeon: Pamella Pert, MD;   Location: [facility] ENDOSCOPY;  Service: Cardiovascular;  Laterality: N/A;   CATARACT EXTRACTION     CHOLECYSTECTOMY     DILATION AND CURETTAGE OF UTERUS     eyelid surgery     TOTAL KNEE ARTHROPLASTY Right 05/03/2016   Procedure: TOTAL KNEE ARTHROPLASTY;  Surgeon: Marcene Corning, MD;  Location: [facility] OR;  Service: Orthopedics;  Laterality: Right;   TOTAL KNEE ARTHROPLASTY Left 08/14/2018   Procedure: TOTAL KNEE ARTHROPLASTY;  Surgeon: Marcene Corning, MD;  Location: [facility] OR;  Service: Orthopedics;  Laterality: Left;   Patient Active Problem List   Diagnosis Date Noted   Other fatigue increased 06/01/2020   Atrial fibrillation (HCC) 04/23/2020   Vitamin D deficiency 04/08/2020   Hyperglycemia 03/12/2020   Essential hypertension 03/12/2020   Mixed hyperlipidemia 01/14/2020   Lower extremity edema 01/14/2020   Depression 01/14/2020   Class 1 obesity with serious comorbidity and body mass index (BMI) of 34.0 to 34.9 in adult 01/14/2020   Primary osteoarthritis of left knee 08/14/2018   Primary localized osteoarthritis of right knee 05/03/2016   Primary osteoarthritis of right knee 05/03/2016   Abnormal auditory perception of both ears 12/01/2015    PCP: Farris Has, MD  REFERRING PROVIDER: Farris Has, MD   REFERRING DIAG: M25.50 (ICD-10-CM) - Pain in unspecified joint   Rationale for Evaluation and Treatment: Rehabilitation  THERAPY DIAG:  Radiculopathy, lumbar region  Cramp and spasm  Other abnormalities of gait and mobility  Muscle weakness (generalized)  ONSET DATE: 2020 progressively worsening in past 2-4 mos.  SUBJECTIVE:                                                                                                                                                                                           SUBJECTIVE STATEMENT: Patient reports that she has been going to [facility] with friend 1-3x/wk with good results of pain management. She states she feels comfortable  discharging after today's visit, "I have all the exercises I need.  I'm doing good.".    PERTINENT HISTORY:  B TKR, AFIB,  From eval: Pain is worsening in the low back and left hip down her leg. Severe pain in L anterior hip x 3-4 months.  Will be seeing pain management also. On Eliquis so can't take much. Does go to [facility] intermittently, but has increased pain if does 30 min. Weakness in L LE with gait causing limp.  PAIN:  Are you having pain? Yes: NPRS scale: 0.5/10 Pain location: low back/sacrum Pain description: ache sometimes sharp Aggravating factors: walk too fast of too big of steps, lying down Relieving factors: sitting in comfortable chair x 45 min    PRECAUTIONS: None   WEIGHT BEARING RESTRICTIONS: No  FALLS:  Has patient fallen in last 6 months? 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 Mild lumbar spine spondylosis as described above. 2. No acute osseous injury of the lumbar spine.  PATIENT SURVEYS:  Modified Oswestry 31 / 50 = 62.0  03/22/23: 30 / 50 = 60.0 %  MUSCLE LENGTH: NT  POSTURE:  left lateral shift; tight R lumbar  PALPATION: Marked pain in R gluteals and lumbar  LUMBAR ROM:   AROM eval 9/4 04/06/23  Flexion Hands to knees Hands to knees Hands to mid shin   Extension 0  10 deg 20 deg   Right lateral flexion 25% To mid thigh Fingers 2 in above knee  Left lateral flexion About 5 deg To mid thigh (less smooth) Fingers 4 in above knee   Right rotation 25% 75%   Left rotation 25% 75%    (Blank rows = not tested)  LOWER  EXTREMITY MMT:     MMT  Right eval Left eval Right 9/4 Left  9/4 Right  9/19 Left 9/19  Hip flexion 4+ 4 5 5 5 5   Hip extension 4 4 5  4+ 5 4+  Hip abduction 4 5 5 5 5  4+  Hip adduction 4+ 4+ 5 4+ 5 5-  Knee flexion 4+ 4+ 5 4 5 5   Knee extension 5 5 5 5 5 5   Ankle dorsiflexion 5 5 5 5 5 5    (Blank rows = not tested)  LOWER EXTREMITY ROM:  WFL   LUMBAR SPECIAL TESTS: + hip scour L  FUNCTIONAL TESTS: unable to complete sit to stand without UE support  GAIT: Distance walked: 30 Assistive device utilized: None Level of assistance: Complete Independence Comments: antalgic, decreased stance time L [facility] Adult PT Treatment:                                                DATE: 04/18/23 Pt seen for aquatic therapy today.  Treatment took place in water 3.5-4.75 ft in depth at the [facility] pool. Temp of water was 91.  Pt entered/exited the pool via stairs with bilat rail. * unsupported walking forward/ backward, then with added yellow hand floats under water with reciprocal pattern * side stepping with yellow  -> rainbow hand floats * holding corner:  leg swings into hip flex / ext 2 x 10; LE circles x 10 CW/CCW  * Holding noodle at 4 ft:  3 way LE kick x 10 each, alternating;  tandem gait forward/ backward  * straddling yellow noodle and yellow hand floats: cycling * at stairs:  hamstring stretch with foot on 3rd stair;  quad stretch with foot on 2nd step   Pt requires the buoyancy and hydrostatic pressure of water for support, and to offload joints by unweighting joint load by at least 50 % in navel deep water and by at least 75-80% in chest to neck deep water.  Viscosity of the water is needed for resistance of strengthening. Water current perturbations provides challenge to standing balance requiring increased core activation.   [facility] Adult PT Treatment:                                                DATE: 04/06/23 Therapeutic Exercise: Nustep L6 x 7 min; L5 x 1 min   UEs/LEs Hip abduction L attempting to lead with heel 3 sec x 10 x 2  Bridging 3 sec x 10 partial range   Hip abduction alternating L/R green TB 3 sec x 10  Sit to stand from ~ 28 in surface x 10 chest up Functional squats in hinge position  x 5  Forward bend in hinge position to tolerable range x 3 (cues to engage HS/glutes for return to stand) - pt needs hands on thighs today Therapeutic Activities: MMT ROM    [facility] Adult PT Treatment:  DATE: 03/21/23 Pt seen for aquatic therapy today.  Treatment took place in water 3.5-4.75 ft in depth at the [facility] pool. Temp of water was 91.  Pt entered/exited the pool via stairs independently with bilat rail in step-to pattern.  * unsupported -Walking forward/ backward/ side stepping  (relaxed squat in between laps as needed for pain relief) - cues for even step length * Holding wall: Lt hip ext to toe touch x 10, 2 sec paused * straddling noodle with UE on wall:  cycling, hip abdct/ addct, cross country ski * return to walking forward/ backward  * quad stretch L/R/L with foot on 2nd step x 15s * STS from bench in water with feet on blue step x 5 - with cues to maintain lumbar curve, core engaged, forward arm reach, slow controlled descent   [facility] Adult PT Treatment:                                                DATE: 03/16/23 Pt seen for aquatic therapy today.  Treatment took place in water 3.5-4.75 ft in depth at the [facility] pool. Temp of water was 91.  Pt entered/exited the pool via stairs independently with bilat rail in step-to pattern.  * unsupported -Walking forward/ backward/ side stepping  (relaxed squat in between laps as needed for pain relief) * Holding wall: heel /toe raises x 15, leg swings into hip flex/ext 3 sets x 5 * return to walking forward/ backward  * straddling noodle with UE on wall:  cycling, hip abdct/ addct, cross country ski * vertical relaxed squat UE  On wall x 6 * quad stretch L/R/L with foot on 2nd step x 15s   [facility] Adult PT Treatment:                                                DATE: 03/14/23 Pt seen for aquatic therapy today.  Treatment took place in water 3.5-4.75 ft in depth at the [facility] pool. Temp of water was 91.  Pt entered/exited the pool via stairs independently with bilat rail in step-to pattern.  * unsupported -Walking forward/ backward/ side stepping  (relaxed squat in between laps as needed for pain relief) * marching with row motion with arms - UE on rainbow hand floats * standard stance with arm addct with rainbow hand floats x 10 * Holding wall: heel raises x 10, hip abdct/ addct 2 x 5; leg swings into hip flex/ext x 10; Lt hip ext x10 * Seated on bench: fig 4 with LLE for hip stretch   [facility] Adult PT Treatment:        (dry needling)                                         DATE: 03/06/23 Therapeutic Exercise: Nustep L6 x 7 min UEs/LEs Glut set prone 10 sec x 10  Lumbar extension against wall 2x30 sec Glute set with hands up on wall 2x10 Sit to stand hinged hip core tight x 5  Standing facing wall trunk extension; glut set 10 sec x 5 Standing facing  wall hip flexor stretch L with L foot behind R  2x30 sec Supine hip flexor stretch L x 2; R x 1 LE off EOB x 30 sec VC for core activation and breathing  Standing glute med iso against wall 10x3 sec R&L holding chair for balance  Standing glute max iso against wall 10x3 sec R&L Manual Therapy: L hip IR//ER windmill  STM & TPR L hip flexors Skilled assessment and palpation for TPDN Trigger Point Dry-Needling  Treatment instructions: Expect mild to moderate muscle soreness. S/S of pneumothorax if dry needled over a lung field, and to seek immediate medical attention should they occur. Patient verbalized understanding of these instructions and education.  Patient Consent Given: Yes Education handout provided: Previously provided Muscles treated: L lower  lumbar paraspinals; piriformis; gluts  Electrical stimulation performed: Yes Parameters: mAmp current; intensity adjusted to patient tolerance  Treatment response/outcome: Twitch response illicited, decreased muscle tension Gait: Pregait: staggered stance working on ant/posterior weight shift maintaining hip extension x10 Fwd amb 40 feet working on maintaining trunk/hip extension and stance phase Fed/Bwd amb by counter x 4 working on glute activation and hip extension   PATIENT EDUCATION:  Education details:aquatic therapy intro Person educated: Patient Education method: Programmer, multimedia, Facilities manager, Education comprehension: verbalized understanding and returned demonstration  HOME EXERCISE PROGRAM: Access Code: [email or web address] URL: [email or web address] Date: 04/06/2023 Prepared by: Corlis Leak  Program Notes standing facing wall press hips forward toward wall hold 10 or more seconds repeat with left foot slightly behind R foot back to wall holding chair as needed for balance, place one foot flat on wall behind you with knee bent press foot into wall hold 3 sec x 10 right/left standing straight and tall squeeze buttock tight hold and repeat several times during the day practice walking at counter forwards and backwards - stay tall, hip first, core tight   Exercises - Supine Pelvic Tilt  - 1 x daily - 7 x weekly - 2 sets - 10 reps - Supine Lower Trunk Rotation  - 1 x daily - 7 x weekly - 1-2 sets - 5 reps - 10 sec hold - Supine Bridge  - 1 x daily - 7 x weekly - 3 sets - 10 reps - Right Standing Lateral Shift Correction at Wall - Hold  - 2 x daily - 7 x weekly - 1 sets - 10 reps - 5 sec hold - Standing Lumbar Extension at Wall - Forearms  - 2 x daily - 7 x weekly - 2 sets - 10 reps - Standing Hip Extension with Counter Support  - 1 x daily - 7 x weekly - 2 sets - 10 reps - Sidelying TFL Stretch  - 1 x daily - 7 x weekly - 2 sets - 10 reps - Clamshell  - 1 x daily - 7 x weekly - 2  sets - 10 reps - Hooklying Isometric Clamshell  - 2 x daily - 7 x weekly - 1 sets - 10 reps - 3 sec  hold - Sit to Stand  - 2 x daily - 7 x weekly - 1 sets - 10 reps - 3-5 sec  hold  - Dry Needling info - Aquatic PT - what to expect  ASSESSMENT:  CLINICAL IMPRESSION: Dallas Schimke completed all exercises from aquatic HEP with minimal cues required.  No increase in pain during session. Added 3 way LE kick away from wall and tandem gait to HEP for work on her balance. Pt requests to d/c today.  She has partially met her goals, but is pleased with current level of function.    GOALS: Goals reviewed with patient? Yes  SHORT TERM GOALS: Target date: 03/15/2023   Patient will be independent with initial HEP.  Baseline:  Goal status: met  2.  Patient will report centralization of radicular symptoms.  Baseline:  Goal status: met  3.  No evidence of lateral shift Baseline:  Goal status: met   LONG TERM GOALS: Target date: 04/12/2023   Patient will be independent with advanced/ongoing HEP to improve outcomes and carryover.  Baseline:  Goal status: IN PROGRESS  2.  Patient will report 75% improvement in low back and leg pain to improve QOL.  Baseline:  Goal status: met 75-80% now   3.  Patient will demonstrate functional pain free lumbar ROM to perform ADLs.   Baseline:  Goal status: met   4.  Patient will demonstrate improved hip strength to 5/5 to normalize gait, transfers and bed mobility. Baseline:  Goal status: IN PROGRESS   5.  Patient will report 25/50 on Modified Oswestry to demonstrate improved functional ability.  Baseline: 31 / 50 = 62.0  Goal status: IN PROGRESS 03/22/23: 30/50  6.  Patient able to return to a walking program without pain provocation Baseline:  Goal status: IN PROGRESS Has been going to the [facility] 3x/wk in addition to PT   PLAN:  PT FREQUENCY: 2x/week  PT DURATION: 8 weeks  PLANNED INTERVENTIONS: Therapeutic exercises, Therapeutic  activity, Neuromuscular re-education, Balance training, Gait training, Patient/Family education, Self Care, Joint mobilization, Aquatic Therapy, Dry Needling, Electrical stimulation, Spinal mobilization, Cryotherapy, Moist heat, Taping, Traction, and Manual therapy.  Mayer Camel, PTA 04/18/23 1:30 PM [facility] [address] [place], [place], [address] Phone: [phone]   Fax:  [phone]

## 2023-04-20 DIAGNOSIS — Z1231 Encounter for screening mammogram for malignant neoplasm of breast: Secondary | ICD-10-CM | POA: Diagnosis not present

## 2023-04-21 ENCOUNTER — Ambulatory Visit (HOSPITAL_BASED_OUTPATIENT_CLINIC_OR_DEPARTMENT_OTHER): Payer: Medicare HMO | Admitting: Physical Therapy

## 2023-04-21 DIAGNOSIS — R350 Frequency of micturition: Secondary | ICD-10-CM | POA: Diagnosis not present

## 2023-04-21 DIAGNOSIS — N3946 Mixed incontinence: Secondary | ICD-10-CM | POA: Diagnosis not present

## 2023-04-25 ENCOUNTER — Ambulatory Visit (HOSPITAL_BASED_OUTPATIENT_CLINIC_OR_DEPARTMENT_OTHER): Payer: Self-pay | Admitting: Physical Therapy

## 2023-05-02 DIAGNOSIS — R7303 Prediabetes: Secondary | ICD-10-CM | POA: Diagnosis not present

## 2023-05-02 DIAGNOSIS — E785 Hyperlipidemia, unspecified: Secondary | ICD-10-CM | POA: Diagnosis not present

## 2023-05-02 DIAGNOSIS — R632 Polyphagia: Secondary | ICD-10-CM | POA: Diagnosis not present

## 2023-05-02 DIAGNOSIS — E559 Vitamin D deficiency, unspecified: Secondary | ICD-10-CM | POA: Diagnosis not present

## 2023-05-02 DIAGNOSIS — Z6841 Body Mass Index (BMI) 40.0 and over, adult: Secondary | ICD-10-CM | POA: Diagnosis not present

## 2023-05-16 ENCOUNTER — Encounter: Payer: Self-pay | Admitting: Physician Assistant

## 2023-05-28 ENCOUNTER — Ambulatory Visit
Admission: RE | Admit: 2023-05-28 | Discharge: 2023-05-28 | Disposition: A | Discharge status: Home / Self Care | Payer: Medicare HMO | Source: Ambulatory Visit | Attending: Physician Assistant | Admitting: Physician Assistant

## 2023-05-28 DIAGNOSIS — H539 Unspecified visual disturbance: Secondary | ICD-10-CM | POA: Diagnosis not present

## 2023-05-28 DIAGNOSIS — R410 Disorientation, unspecified: Secondary | ICD-10-CM | POA: Diagnosis not present

## 2023-05-28 DIAGNOSIS — G44219 Episodic tension-type headache, not intractable: Secondary | ICD-10-CM | POA: Diagnosis not present

## 2023-05-28 MED ORDER — GADOPICLENOL 0.5 MMOL/ML IV SOLN
10.0000 mL | Freq: Once | INTRAVENOUS | Status: AC | PRN
  Administered 2023-05-28: 10 mL via INTRAVENOUS

## 2023-05-29 ENCOUNTER — Other Ambulatory Visit: Payer: Self-pay | Admitting: Neurology

## 2023-06-07 DIAGNOSIS — E538 Deficiency of other specified B group vitamins: Secondary | ICD-10-CM | POA: Diagnosis not present

## 2023-06-07 DIAGNOSIS — I4891 Unspecified atrial fibrillation: Secondary | ICD-10-CM | POA: Diagnosis not present

## 2023-06-07 DIAGNOSIS — E039 Hypothyroidism, unspecified: Secondary | ICD-10-CM | POA: Diagnosis not present

## 2023-06-07 DIAGNOSIS — I10 Essential (primary) hypertension: Secondary | ICD-10-CM | POA: Diagnosis not present

## 2023-06-07 DIAGNOSIS — Z23 Encounter for immunization: Secondary | ICD-10-CM | POA: Diagnosis not present

## 2023-06-07 DIAGNOSIS — M5430 Sciatica, unspecified side: Secondary | ICD-10-CM | POA: Diagnosis not present

## 2023-06-07 DIAGNOSIS — E785 Hyperlipidemia, unspecified: Secondary | ICD-10-CM | POA: Diagnosis not present

## 2023-06-07 DIAGNOSIS — Z0000 Encounter for general adult medical examination without abnormal findings: Secondary | ICD-10-CM | POA: Diagnosis not present

## 2023-06-07 DIAGNOSIS — I700 Atherosclerosis of aorta: Secondary | ICD-10-CM | POA: Diagnosis not present

## 2023-06-07 DIAGNOSIS — D6869 Other thrombophilia: Secondary | ICD-10-CM | POA: Diagnosis not present

## 2023-06-13 DIAGNOSIS — E785 Hyperlipidemia, unspecified: Secondary | ICD-10-CM | POA: Diagnosis not present

## 2023-06-13 DIAGNOSIS — Z6841 Body Mass Index (BMI) 40.0 and over, adult: Secondary | ICD-10-CM | POA: Diagnosis not present

## 2023-06-13 DIAGNOSIS — R632 Polyphagia: Secondary | ICD-10-CM | POA: Diagnosis not present

## 2023-06-13 DIAGNOSIS — R7303 Prediabetes: Secondary | ICD-10-CM | POA: Diagnosis not present

## 2023-06-13 DIAGNOSIS — E559 Vitamin D deficiency, unspecified: Secondary | ICD-10-CM | POA: Diagnosis not present

## 2023-06-30 DIAGNOSIS — M25559 Pain in unspecified hip: Secondary | ICD-10-CM | POA: Diagnosis not present

## 2023-07-04 ENCOUNTER — Encounter: Payer: Self-pay | Admitting: Physical Medicine and Rehabilitation

## 2023-08-02 ENCOUNTER — Encounter: Payer: Medicare (Managed Care) | Admitting: Physical Medicine and Rehabilitation

## 2023-08-07 ENCOUNTER — Telehealth: Payer: Self-pay | Admitting: *Deleted

## 2023-08-07 NOTE — Telephone Encounter (Signed)
   Pre-operative Risk Assessment    Patient Name: Robin Christensen  DOB: Oct 18, 1950 MRN: [id number]   Date of last office visit: NONE WITH [facility] Date of next office visit: NONE: PT WILL NEED A NEW PT APPT FOR PREOP CLEARANCE   Request for Surgical Clearance    Procedure:  LEFT TOTAL ANTERIOR HIP ARTHROPLASTY  Date of Surgery:  Clearance TBD                                Surgeon:  DR. Marcene Corning Surgeon's Group or Practice Name:  [facility] Phone number:  [phone] REBECCA LANG Fax number:  [phone]   Type of Clearance Requested:   - Medical  - Pharmacy:  Hold Apixaban (Eliquis)     Type of Anesthesia:  Spinal   Additional requests/questions:    Elpidio Anis   08/07/2023, 10:17 AM

## 2023-08-08 NOTE — Telephone Encounter (Signed)
Per Selena Batten T scheduler left message today for pt to call back to schedule new pt appt for preop.

## 2023-08-08 NOTE — Telephone Encounter (Signed)
Pt has been scheduled new pt appt for preop clearance. I will update the requesting office.

## 2023-08-11 ENCOUNTER — Telehealth: Payer: Self-pay

## 2023-08-11 NOTE — Telephone Encounter (Signed)
PA needed for Zonisamide mg.

## 2023-08-15 ENCOUNTER — Ambulatory Visit: Payer: Medicare (Managed Care) | Attending: Cardiology | Admitting: Cardiology

## 2023-08-15 ENCOUNTER — Ambulatory Visit: Payer: Medicare (Managed Care) | Admitting: Cardiology

## 2023-08-15 VITALS — BP 100/80 | HR 56 | Wt 263.0 lb

## 2023-08-15 DIAGNOSIS — Z79899 Other long term (current) drug therapy: Secondary | ICD-10-CM

## 2023-08-15 DIAGNOSIS — I480 Paroxysmal atrial fibrillation: Secondary | ICD-10-CM

## 2023-08-15 DIAGNOSIS — I10 Essential (primary) hypertension: Secondary | ICD-10-CM | POA: Diagnosis not present

## 2023-08-15 DIAGNOSIS — Z01818 Encounter for other preprocedural examination: Secondary | ICD-10-CM | POA: Diagnosis not present

## 2023-08-15 LAB — BASIC METABOLIC PANEL
BUN/Creatinine Ratio: 21 (ref 12–28)
BUN: 18 mg/dL (ref 8–27)
CO2: 25 mmol/L (ref 20–29)
Calcium: 9.5 mg/dL (ref 8.7–10.3)
Chloride: 104 mmol/L (ref 96–106)
Creatinine, Ser: 0.85 mg/dL (ref 0.57–1.00)
Glucose: 101 mg/dL — ABNORMAL HIGH (ref 70–99)
Potassium: 4 mmol/L (ref 3.5–5.2)
Sodium: 144 mmol/L (ref 134–144)
eGFR: 72 mL/min/{1.73_m2} (ref >59)

## 2023-08-15 NOTE — Addendum Note (Signed)
Addended by: Luellen Pucker on: 08/15/2023 09:19 AM   Modules accepted: Orders

## 2023-08-15 NOTE — Progress Notes (Signed)
Cardiology CONSULT Note    Date:  08/15/2023   ID:  Robin Christensen, DOB [birth year], MRN [id number]  PCP:  Farris Has, MD  Cardiologist:  Armanda Magic, MD   Chief Complaint  Patient presents with   New Patient (Initial Visit)    Preoperative clearance, PAF    Patient Profile: Robin Christensen is a 73 y.o. female who is being seen today for the evaluation of cardiac clearance at the request of Marcene Corning, MD.  History of Present Illness:  Robin Christensen is a 73 y.o. female who is being seen today for the evaluation of presurgical clearance at the request of Marcene Corning, MD.  This is a 73 year old female with a history of atrial fibrillation, breast cancer, GERD, hypertension, hyperlipidemia, sleep apnea on CPAP and PAF.  She is referred for presurgical clearance prior to undergoing hip surgery.  She was seen in the past by Dr. Jacinto Halim for atrial fibrillation and apparently had a cardioversion 2015.  I do not have any other cardiac notes.  She denies any chest pain or pressure, SOB, DOE, PND, orthopnea, LE edema, dizziness or syncope. Rarely she will feel some palpitations but only lasts a few seconds and resolves.  She has not had any further afib. She is compliant with her meds and is tolerating meds with no SE.    Past Medical History:  Diagnosis Date   A-fib (HCC)    B12 deficiency    Back pain    Breast cancer (HCC)    Cancer (HCC)    Right Breast Cancer   Chronic headaches    Dysrhythmia    AF   Edema, lower extremity    Gallbladder disease    GERD (gastroesophageal reflux disease)    Hyperlipidemia    Hypertension    Hypothyroidism    Joint pain    Obesity    Pneumonia    PONV (postoperative nausea and vomiting)    Sleep apnea    cpcap   Vitamin deficiency     Past Surgical History:  Procedure Laterality Date   BREAST SURGERY     CARDIOVERSION N/A 06/26/2014   Procedure: CARDIOVERSION;  Surgeon: Pamella Pert, MD;  Location[facility]  ENDOSCOPY;  Service: Cardiovascular;  Laterality: N/A;   CATARACT EXTRACTION     CHOLECYSTECTOMY     DILATION AND CURETTAGE OF UTERUS     eyelid surgery     TOTAL KNEE ARTHROPLASTY Right 05/03/2016   Procedure: TOTAL KNEE ARTHROPLASTY;  Surgeon: Marcene Corning, MD;  Location: [facility] OR;  Service: Orthopedics;  Laterality: Right;   TOTAL KNEE ARTHROPLASTY Left 08/14/2018   Procedure: TOTAL KNEE ARTHROPLASTY;  Surgeon: Marcene Corning, MD;  Location: [facility] OR;  Service: Orthopedics;  Laterality: Left;    Current Medications: Current Meds  Medication Sig   CALCIUM-VITAMIN D PO Take 2 tablets by mouth every evening.   cholecalciferol (VITAMIN D3) 25 MCG (1000 UNIT) tablet Take 1,000 Units by mouth daily.   ELIQUIS 5 MG TABS tablet TAKE 1 TABLET TWICE DAILY (Patient taking differently: Take 5 mg by mouth 2 (two) times daily.)   furosemide (LASIX) 40 MG tablet Take 40 mg by mouth daily.   gabapentin (NEURONTIN) 300 MG capsule Take 300 mg by mouth 3 (three) times daily. 300 mg  in the morning, 300 mg  in afternoon, and 600 mg at bedtime   levothyroxine (SYNTHROID, LEVOTHROID) 75 MCG tablet Take 75 mcg by mouth daily before breakfast.   oxybutynin (DITROPAN-XL)  10 MG 24 hr tablet Take 10 mg by mouth daily.   rosuvastatin (CRESTOR) 10 MG tablet Take 10 mg by mouth daily.   vitamin B-12 (CYANOCOBALAMIN) 1000 MCG tablet Take 1,000 mcg by mouth daily.   zonisamide (ZONEGRAN) 25 MG capsule Take 3 capsules (75 mg total) by mouth daily.    Allergies:   Rivaroxaban   Social History   Socioeconomic History   Marital status: Divorced    Spouse name: Not on file   Number of children: 2   Years of education: Not on file   Highest education level: Bachelor's degree (e.g., BA, AB, BS)  Occupational History   Occupation: RETIRED  Tobacco Use   Smoking status: Never   Smokeless tobacco: Never  Vaping Use   Vaping status: Never Used  Substance and Sexual Activity   Alcohol use: Yes    Comment: once a  year, maybe   Drug use: No   Sexual activity: Not on file  Other Topics Concern   Not on file  Social History Narrative   Patient is right-handed. She avoids caffeine, walks 3 miles 5 days a week. She lives in a 2 story house. Prior to retirement, she was in Audiological scientist.   Social Drivers of Corporate investment banker Strain: Not on file  Food Insecurity: Not on file  Transportation Needs: Not on file  Physical Activity: Not on file  Stress: Not on file  Social Connections: Not on file     Family History:  The patient's family history includes Cancer in her father and mother; Depression in her mother; Heart disease in her father and mother; High blood pressure in her mother; Kidney disease in her father; Obesity in her mother.   ROS:   Please see the history of present illness.    ROS All other systems reviewed and are negative.      No data to display             PHYSICAL EXAM:   VS:  BP 100/80 (Cuff Size: Large)   Pulse (!) 56   Wt 263 lb (119.3 kg)   SpO2 95%   BMI 39.99 kg/m    GEN: Well nourished, well developed, in no acute distress  HEENT: normal  Neck: no JVD, carotid bruits, or masses Cardiac: RRR; no murmurs, rubs, or gallops,no edema.  Intact distal pulses bilaterally.  Respiratory:  clear to auscultation bilaterally, normal work of breathing GI: soft, nontender, nondistended, + BS MS: no deformity or atrophy  Skin: warm and dry, no rash Neuro:  Alert and Oriented x 3, Strength and sensation are intact Psych: euthymic mood, full affect  Wt Readings from Last 3 Encounters:  08/15/23 263 lb (119.3 kg)  04/17/23 286 lb (129.7 kg)  04/15/22 285 lb (129.3 kg)      Studies/Labs Reviewed:   EKG Interpretation Date/Time:  Tuesday August 15 2023 08:49:05 EST Ventricular Rate:  56 PR Interval:  182 QRS Duration:  86 QT Interval:  418 QTC Calculation: 403 R Axis:   -3  Text Interpretation: Sinus bradycardia Minimal voltage criteria for LVH, may be  normal variant ( R in aVL ) Nonspecific ST and T wave abnormality When compared with ECG of 07-Aug-2018 10:40, Nonspecific T wave abnormality has replaced inverted T waves in Lateral leads Confirmed by Armanda Magic ([id number]) on 08/15/2023 9:10:18 AM       Recent Labs: No results found for requested labs within last 365 days.   Lipid Panel  CHA2DS2-VASc Score = 3   This indicates a 3.2% annual risk of stroke. The patient's score is based upon: CHF History: 0 HTN History: 1 Diabetes History: 0 Stroke History: 0 Vascular Disease History: 0 Age Score: 1 Gender Score: 1     ASSESSMENT:    1. Preoperative clearance   2. Paroxysmal atrial fibrillation (HCC)   3. Essential hypertension      PLAN:  In order of problems listed above:  Preoperative cardiovascular examination Her perioperative risk of a major cardiac event is 0.4% according to the Revised Cardiac Risk Index (RCRI).  Therefore, the patient is at low risk for perioperative complications.   The patient's  functional capacity is fair at 5.07 METs 9 (activity limited only by her hip) according to the Duke Activity Status Index (DASI). Recommendations: According to ACC/AHA guidelines, no further cardiovascular testing needed.  The patient may proceed to surgery at acceptable risk.   -I will have Pharm D instruct patient on how to hold anticoagulation prior to her surgery  PAF -She is status post remote cardioversion in 2015 by Dr. Jacinto Halim -She is maintaining normal sinus rhythm on exam today denies any palpitations -CHA2DS2-VASc score is 3 -Continue prescription drug management with apixaban 5 mg twice daily with as needed refills -I have personally reviewed and interpreted outside labs performed by patient's PCP which showed Hbg 14.9 on 05/2023 -check BMET  Hypertension -BP adequately controlled on exam -She has not required any antihypertensive medication recently  Time Spent: 20 minutes total time of encounter,  including 15 minutes spent in face-to-face patient care on the date of this encounter. This time includes coordination of care and counseling regarding above mentioned problem list. Remainder of non-face-to-face time involved reviewing chart documents/testing relevant to the patient encounter and documentation in the medical record. I have independently reviewed documentation from referring provider  Followup:  1 year  Medication Adjustments/Labs and Tests Ordered: Current medicines are reviewed at length with the patient today.  Concerns regarding medicines are outlined above.  Medication changes, Labs and Tests ordered today are listed in the Patient Instructions below.  There are no Patient Instructions on file for this visit.   Signed, Armanda Magic, MD  08/15/2023 9:16 AM    [facility] [address] [place], [place], [place]  [address] Phone: [phone]; Fax: [phone]

## 2023-08-15 NOTE — Patient Instructions (Signed)
Medication Instructions:  Your physician recommends that you continue on your current medications as directed. Please refer to the Current Medication list given to you today.  *If you need a refill on your cardiac medications before your next appointment, please call your pharmacy*   Lab Work: Please complete a BMET at the [facility] on the first floor today before you leave.  If you have labs (blood work) drawn today and your tests are completely normal, you will receive your results only by: MyChart Message (if you have MyChart) OR A paper copy in the mail If you have any lab test that is abnormal or we need to change your treatment, we will call you to review the results.   Testing/Procedures: None.   Follow-Up: do with MyChart, go to [email or web address].    Your next appointment:   1 year(s)  Provider:   Dr. Armanda Magic, MD

## 2023-08-16 NOTE — Telephone Encounter (Signed)
   Patient Name: Robin Christensen  DOB: [birth year] MRN: [id number]  Primary Cardiologist: None  Please see separate office note from Dr. Mayford Knife regarding clearance.  CrCl 80 (with adjusted body weight) Platelet count: none in Epic.  Can proceed without - please make surgeon aware no platelet count is available.   Per office protocol, patient can hold Eliquis for 3 days prior to procedure.   Patient will not need bridging with Lovenox (enoxaparin) around procedure.  I will route this recommendation to the requesting party via Epic fax function and remove from pre-op pool.  Please call with questions.  Tereso Newcomer, PA-C 08/16/2023, 7:36 AM

## 2023-08-16 NOTE — Telephone Encounter (Signed)
Patient with diagnosis of atrial fibrillation on Eliquis for anticoagulation.    Procedure:  LEFT TOTAL ANTERIOR HIP ARTHROPLASTY   Date of Surgery:  Clearance TBD   CHA2DS2-VASc Score = 3   This indicates a 3.2% annual risk of stroke. The patient's score is based upon: CHF History: 0 HTN History: 1 Diabetes History: 0 Stroke History: 0 Vascular Disease History: 0 Age Score: 1 Gender Score: 1    CrCl 80 (with adjusted body weight) Platelet count: none in Epic.  Can proceed without - please make surgeon aware  Per office protocol, patient can hold Eliquis for 3 days prior to procedure.   Patient will not need bridging with Lovenox (enoxaparin) around procedure.  **This guidance is not considered finalized until pre-operative APP has relayed final recommendations.**

## 2023-08-16 NOTE — Telephone Encounter (Signed)
Notes faxed to surgeon. This phone note will be removed from the preop pool. Tereso Newcomer, PA-C  08/16/2023 7:39 AM

## 2023-08-24 DIAGNOSIS — R632 Polyphagia: Secondary | ICD-10-CM | POA: Diagnosis not present

## 2023-08-24 DIAGNOSIS — E785 Hyperlipidemia, unspecified: Secondary | ICD-10-CM | POA: Diagnosis not present

## 2023-08-24 DIAGNOSIS — R7303 Prediabetes: Secondary | ICD-10-CM | POA: Diagnosis not present

## 2023-08-24 DIAGNOSIS — Z6841 Body Mass Index (BMI) 40.0 and over, adult: Secondary | ICD-10-CM | POA: Diagnosis not present

## 2023-08-24 DIAGNOSIS — M25552 Pain in left hip: Secondary | ICD-10-CM | POA: Diagnosis not present

## 2023-08-24 DIAGNOSIS — E559 Vitamin D deficiency, unspecified: Secondary | ICD-10-CM | POA: Diagnosis not present

## 2023-08-30 NOTE — Telephone Encounter (Signed)
PA status

## 2023-09-07 ENCOUNTER — Other Ambulatory Visit: Payer: Self-pay | Admitting: Orthopaedic Surgery

## 2023-09-07 HISTORY — PX: CARDIOVERSION: SHX1299

## 2023-09-08 ENCOUNTER — Other Ambulatory Visit (HOSPITAL_COMMUNITY): Payer: Self-pay

## 2023-09-08 ENCOUNTER — Telehealth: Payer: Self-pay

## 2023-09-08 NOTE — Telephone Encounter (Signed)
 PA request has been Submitted. New Encounter created for follow up. For additional info see Pharmacy Prior Auth telephone encounter from 09/08/2023.

## 2023-09-08 NOTE — Telephone Encounter (Signed)
 Pharmacy Patient Advocate Encounter   Received notification from Pt Calls Messages that prior authorization for Zonisamide 25MG  capsules is required/requested.   Insurance verification completed.   The patient is insured through [employer] .   Per test claim: PA required; PA submitted to above mentioned insurance via CoverMyMeds Key/confirmation #/EOC [facility] Status is pending

## 2023-09-11 NOTE — Telephone Encounter (Signed)
 Pharmacy Patient Advocate Encounter  Received notification from [employer] that Prior Authorization for Zonisamide 25MG  capsules has been DENIED.  Full denial letter will be uploaded to the media tab. See denial reason below.  The information received from your physician did not confirm that the requested drug is being used to treat a diagnosis/condition that is covered under the Medicare Part D benefit. Your plan's formulary has a restriction on this drug. This restriction has been approved by the [employer] for [employer] (CMS). Coverage of the requested medication is provided under Medicare Part D when the Food and Drug Administration (FDA) has approved use of the requested medication for the diagnosis provided by your prescriber or if use of the requested medication is considered a Medicare Part D use of the drug (even though not FDA approved). Examples of common Medicare Part D uses are seizures and migraine headaches (indications vary by requested drug). If the requested drug is being used for weight loss or smoking cessation it is excluded from coverage under the Medicare Part D benefit.   We based this decision on the coverage criteria for: [address] [employer] Standard MEDD Prior Authorization Policy - Topiramate/Zonisamide D/Non D plus clinical (non-enhanced)  PA #/Case ID/Reference #: [id number]

## 2023-09-12 ENCOUNTER — Telehealth: Payer: Self-pay | Admitting: Pharmacist

## 2023-09-12 NOTE — Progress Notes (Signed)
 Please send preop orders for PST visit 09/14/23.

## 2023-09-12 NOTE — Progress Notes (Signed)
 COVID Vaccine received:  []  No [x]  Yes Date of any COVID positive Test in last 90 days: no PCP - Farris Has MD Cardiologist - Dr. Carolanne Grumbling  Chest x-ray -  EKG -  08/15/23 Epic Stress Test -  ECHO -  Cardiac Cath -   Cardiac Clearance -Dr. Mayford Knife -08/15/23  Bowel Prep - [x]  No  []   Yes ______  Pacemaker / ICD device [x]  No []  Yes   Spinal Cord Stimulator:[x]  No []  Yes       History of Sleep Apnea? []  No [x]  Yes   CPAP used?- []  No [x]  Yes    Does the patient monitor blood sugar?          [x]  No []  Yes  []  N/A  Patient has: [x]  NO Hx DM   []  Pre-DM                 []  DM1  []   DM2 Does patient have a [employer] or [employer]om? []  No []  Yes   Fasting Blood Sugar Ranges-  Checks Blood Sugar _____ times a day  GLP1 agonist / usual dose - no GLP1 instructions:  SGLT-2 inhibitors / usual dose - no SGLT-2 instructions:   Blood Thinner / Instructions:Eliquis- Last dose March 2 PM Aspirin Instructions:no  Comments:   Activity level: Patient is able  to climb a flight of stairs without difficulty; [x]  No CP  [x]  No SOB   Patient can /  perform ADLs without assistance.   Anesthesia review: A fib, Cardioversion 09/12/23  Patient denies shortness of breath, fever, cough and chest pain at PAT appointment.  Patient verbalized understanding and agreement to the Pre-Surgical Instructions that were given to them at this PAT appointment. Patient was also educated of the need to review these PAT instructions again prior to his/her surgery.I reviewed the appropriate phone numbers to call if they have any and questions or concerns.

## 2023-09-12 NOTE — Telephone Encounter (Signed)
 Pharmacy Patient Advocate Encounter  Information has been sent to clinical pharmacist for appeals review. It may take 5-7 days to prepare the necessary documentation to request the appeal from the insurance.

## 2023-09-12 NOTE — Telephone Encounter (Signed)
 Appeal has been submitted for Zonisamide. Will advise when response is received, please be advised that most companies may take 30 days to make a decision. Appeal letter and supporting information have been faxed to [phone] on 09/12/2023 @3 :49pm.  Thank you, Dellie Burns, PharmD Clinical Pharmacist  [place]  Direct Dial: [phone]

## 2023-09-12 NOTE — Patient Instructions (Signed)
 SURGICAL WAITING ROOM VISITATION  Patients having surgery or a procedure may have no more than 2 support people in the waiting area - these visitors may rotate.    Children under the age of 48 must have an adult with them who is not the patient.  Due to an increase in RSV and influenza rates and associated hospitalizations, children ages 38 and under may not visit patients in [facility] hospitals.  Visitors with respiratory illnesses are discouraged from visiting and should remain at home.  If the patient needs to stay at the hospital during part of their recovery, the visitor guidelines for inpatient rooms apply. Pre-op nurse will coordinate an appropriate time for 1 support person to accompany patient in pre-op.  This support person may not rotate.    Please refer to the [facility] website for the visitor guidelines for Inpatients (after your surgery is over and you are in a regular room).       Your procedure is scheduled on: 09/21/23   Report to [facility] Main Entrance    Report to admitting at 9:45 AM   Call this number if you have problems the morning of surgery [phone]   Do not eat food or drink liquids :After Midnight. Except sips of water to take meds.        Oral Hygiene is also important to reduce your risk of infection.                                    Remember - BRUSH YOUR TEETH THE MORNING OF SURGERY WITH YOUR REGULAR TOOTHPASTE   Stop all vitamins and herbal supplements 7 days before surgery.   Take these medicines the morning of surgery with A SIP OF WATER: Gabapentin, Tylenol, Levothyroxine, Rosuvastatin, Zonegran  Bring CPAP mask and tubing day of surgery.                              You may not have any metal on your body including hair pins, jewelry, and body piercing             Do not wear make-up, lotions, powders, perfumes/cologne, or deodorant  Do not wear nail polish including gel and S&S, artificial/acrylic nails, or any other  type of covering on natural nails including finger and toenails. If you have artificial nails, gel coating, etc. that needs to be removed by a nail salon please have this removed prior to surgery or surgery may need to be canceled/ delayed if the surgeon/ anesthesia feels like they are unable to be safely monitored.   Do not shave  48 hours prior to surgery.    Do not bring valuables to the hospital. [facility] IS NOT             RESPONSIBLE   FOR VALUABLES.   Contacts, glasses, dentures or bridgework may not be worn into surgery.  DO NOT BRING YOUR HOME MEDICATIONS TO THE HOSPITAL. PHARMACY WILL DISPENSE MEDICATIONS LISTED ON YOUR MEDICATION LIST TO YOU DURING YOUR ADMISSION IN THE HOSPITAL!    Patients discharged on the day of surgery will not be allowed to drive home.  Someone NEEDS to stay with you for the first 24 hours after anesthesia.   Special Instructions: Bring a copy of your healthcare power of attorney and living will documents the day of surgery if  you haven't scanned them before.              Please read over the following fact sheets you were given: IF YOU HAVE QUESTIONS ABOUT YOUR PRE-OP INSTRUCTIONS PLEASE CALL [phone] Rosey Bath   If you received a COVID test during your pre-op visit  it is requested that you wear a mask when out in public, stay away from anyone that may not be feeling well and notify your surgeon if you develop symptoms. If you test positive for Covid or have been in contact with anyone that has tested positive in the last 10 days please notify you surgeon.      Pre-operative 5 CHG Bath Instructions   You can play a key role in reducing the risk of infection after surgery. Your skin needs to be as free of germs as possible. You can reduce the number of germs on your skin by washing with CHG (chlorhexidine gluconate) soap before surgery. CHG is an antiseptic soap that kills germs and continues to kill germs even after washing.   DO NOT use if you have  an allergy to chlorhexidine/CHG or antibacterial soaps. If your skin becomes reddened or irritated, stop using the CHG and notify one of our RNs at [phone].   Please shower with the CHG soap starting 4 days before surgery using the following schedule:     Please keep in mind the following:  DO NOT shave, including legs and underarms, starting the day of your first shower.   You may shave your face at any point before/day of surgery.  Place clean sheets on your bed the day you start using CHG soap. Use a clean washcloth (not used since being washed) for each shower. DO NOT sleep with pets once you start using the CHG.   CHG Shower Instructions:  If you choose to wash your hair and private area, wash first with your normal shampoo/soap.  After you use shampoo/soap, rinse your hair and body thoroughly to remove shampoo/soap residue.  Turn the water OFF and apply about 3 tablespoons (45 ml) of CHG soap to a CLEAN washcloth.  Apply CHG soap ONLY FROM YOUR NECK DOWN TO YOUR TOES (washing for 3-5 minutes)  DO NOT use CHG soap on face, private areas, open wounds, or sores.  Pay special attention to the area where your surgery is being performed.  If you are having back surgery, having someone wash your back for you may be helpful. Wait 2 minutes after CHG soap is applied, then you may rinse off the CHG soap.  Pat dry with a clean towel  Put on clean clothes/pajamas   If you choose to wear lotion, please use ONLY the CHG-compatible lotions on the back of this paper.     Additional instructions for the day of surgery: DO NOT APPLY any lotions, deodorants, cologne, or perfumes.   Put on clean/comfortable clothes.  Brush your teeth.  Ask your nurse before applying any prescription medications to the skin.      CHG Compatible Lotions   Aveeno Moisturizing lotion  Cetaphil Moisturizing Cream  Cetaphil Moisturizing Lotion  Clairol Herbal Essence Moisturizing Lotion, Dry Skin  Clairol  Herbal Essence Moisturizing Lotion, Extra Dry Skin  Clairol Herbal Essence Moisturizing Lotion, Normal Skin  Curel Age Defying Therapeutic Moisturizing Lotion with Alpha Hydroxy  Curel Extreme Care Body Lotion  Curel Soothing Hands Moisturizing Hand Lotion  Curel Therapeutic Moisturizing Cream, Fragrance-Free  Curel Therapeutic Moisturizing Lotion, Fragrance-Free  Curel Therapeutic Moisturizing  Lotion, Original Formula  Eucerin Daily Replenishing Lotion  Eucerin Dry Skin Therapy Plus Alpha Hydroxy Crme  Eucerin Dry Skin Therapy Plus Alpha Hydroxy Lotion  Eucerin Original Crme  Eucerin Original Lotion  Eucerin Plus Crme Eucerin Plus Lotion  Eucerin TriLipid Replenishing Lotion  Keri Anti-Bacterial Hand Lotion  Keri Deep Conditioning Original Lotion Dry Skin Formula Softly Scented  Keri Deep Conditioning Original Lotion, Fragrance Free Sensitive Skin Formula  Keri Lotion Fast Absorbing Fragrance Free Sensitive Skin Formula  Keri Lotion Fast Absorbing Softly Scented Dry Skin Formula  Keri Original Lotion  Keri Skin Renewal Lotion Keri Silky Smooth Lotion  Keri Silky Smooth Sensitive Skin Lotion  Nivea Body Creamy Conditioning Oil  Nivea Body Extra Enriched Teacher, adult education Moisturizing Lotion Nivea Crme  Nivea Skin Firming Lotion  NutraDerm 30 Skin Lotion  NutraDerm Skin Lotion  NutraDerm Therapeutic Skin Cream  NutraDerm Therapeutic Skin Lotion  ProShield Protective Hand Cream   WHAT IS A BLOOD TRANSFUSION? Blood Transfusion Information  A transfusion is the replacement of blood or some of its parts. Blood is made up of multiple cells which provide different functions. Red blood cells carry oxygen and are used for blood loss replacement. White blood cells fight against infection. Platelets control bleeding. Plasma helps clot blood. Other blood products are available for specialized needs, such as hemophilia or other clotting  disorders. BEFORE THE TRANSFUSION  Who gives blood for transfusions?  Healthy volunteers who are fully evaluated to make sure their blood is safe. This is blood bank blood. Transfusion therapy is the safest it has ever been in the practice of medicine. Before blood is taken from a donor, a complete history is taken to make sure that person has no history of diseases nor engages in risky social behavior (examples are intravenous drug use or sexual activity with multiple partners). The donor's travel history is screened to minimize risk of transmitting infections, such as malaria. The donated blood is tested for signs of infectious diseases, such as HIV and hepatitis. The blood is then tested to be sure it is compatible with you in order to minimize the chance of a transfusion reaction. If you or a relative donates blood, this is often done in anticipation of surgery and is not appropriate for emergency situations. It takes many days to process the donated blood. RISKS AND COMPLICATIONS Although transfusion therapy is very safe and saves many lives, the main dangers of transfusion include:  Getting an infectious disease. Developing a transfusion reaction. This is an allergic reaction to something in the blood you were given. Every precaution is taken to prevent this. The decision to have a blood transfusion has been considered carefully by your caregiver before blood is given. Blood is not given unless the benefits outweigh the risks. AFTER THE TRANSFUSION Right after receiving a blood transfusion, you will usually feel much better and more energetic. This is especially true if your red blood cells have gotten low (anemic). The transfusion raises the level of the red blood cells which carry oxygen, and this usually causes an energy increase. The nurse administering the transfusion will monitor you carefully for complications. HOME CARE INSTRUCTIONS  No special instructions are needed after a transfusion.  You may find your energy is better. Speak with your caregiver about any limitations on activity for underlying diseases you may have. SEEK MEDICAL CARE IF:  Your condition is not improving after your transfusion. You develop redness or irritation at the  intravenous (IV) site. SEEK IMMEDIATE MEDICAL CARE IF:  Any of the following symptoms occur over the next 12 hours: Shaking chills. You have a temperature by mouth above 102 F (38.9 C), not controlled by medicine. Chest, back, or muscle pain. People around you feel you are not acting correctly or are confused. Shortness of breath or difficulty breathing. Dizziness and fainting. You get a rash or develop hives. You have a decrease in urine output. Your urine turns a dark color or changes to pink, red, or brown. Any of the following symptoms occur over the next 10 days: You have a temperature by mouth above 102 F (38.9 C), not controlled by medicine. Shortness of breath. Weakness after normal activity. The white part of the eye turns yellow (jaundice). You have a decrease in the amount of urine or are urinating less often. Your urine turns a dark color or changes to pink, red, or brown. Document Released: 07/01/2000 Document Revised: 09/26/2011 Document Reviewed: 02/18/2008 [facility] Patient Information 2014 [place], [place].

## 2023-09-12 NOTE — Progress Notes (Signed)
 Sent message, via epic in basket, requesting orders in epic from Careers adviser.

## 2023-09-13 DIAGNOSIS — R9431 Abnormal electrocardiogram [ECG] [EKG]: Secondary | ICD-10-CM | POA: Diagnosis not present

## 2023-09-13 DIAGNOSIS — R072 Precordial pain: Secondary | ICD-10-CM | POA: Diagnosis not present

## 2023-09-13 DIAGNOSIS — I480 Paroxysmal atrial fibrillation: Secondary | ICD-10-CM | POA: Diagnosis not present

## 2023-09-14 ENCOUNTER — Encounter (HOSPITAL_COMMUNITY): Payer: Self-pay | Admitting: Physician Assistant

## 2023-09-14 ENCOUNTER — Encounter (HOSPITAL_COMMUNITY)
Admission: RE | Admit: 2023-09-14 | Discharge: 2023-09-14 | Disposition: A | Discharge status: Home / Self Care | Payer: Medicare (Managed Care) | Source: Ambulatory Visit | Attending: Orthopaedic Surgery | Admitting: Orthopaedic Surgery

## 2023-09-14 ENCOUNTER — Other Ambulatory Visit: Payer: Self-pay

## 2023-09-14 ENCOUNTER — Encounter (HOSPITAL_COMMUNITY): Payer: Self-pay

## 2023-09-14 VITALS — BP 124/77 | HR 59 | Temp 97.9°F | Resp 16 | Ht 68.0 in | Wt 256.0 lb

## 2023-09-14 DIAGNOSIS — I4891 Unspecified atrial fibrillation: Secondary | ICD-10-CM | POA: Insufficient documentation

## 2023-09-14 DIAGNOSIS — Z853 Personal history of malignant neoplasm of breast: Secondary | ICD-10-CM | POA: Diagnosis not present

## 2023-09-14 DIAGNOSIS — Z01812 Encounter for preprocedural laboratory examination: Secondary | ICD-10-CM | POA: Diagnosis not present

## 2023-09-14 DIAGNOSIS — G4730 Sleep apnea, unspecified: Secondary | ICD-10-CM | POA: Diagnosis not present

## 2023-09-14 DIAGNOSIS — Z01818 Encounter for other preprocedural examination: Secondary | ICD-10-CM

## 2023-09-14 DIAGNOSIS — M1612 Unilateral primary osteoarthritis, left hip: Secondary | ICD-10-CM | POA: Diagnosis not present

## 2023-09-14 DIAGNOSIS — I10 Essential (primary) hypertension: Secondary | ICD-10-CM | POA: Insufficient documentation

## 2023-09-14 DIAGNOSIS — Z7901 Long term (current) use of anticoagulants: Secondary | ICD-10-CM | POA: Diagnosis not present

## 2023-09-14 HISTORY — DX: Unspecified osteoarthritis, unspecified site: M19.90

## 2023-09-14 LAB — BASIC METABOLIC PANEL
Anion gap: 11 (ref 5–15)
BUN: 17 mg/dL (ref 8–23)
CO2: 23 mmol/L (ref 22–32)
Calcium: 8.9 mg/dL (ref 8.9–10.3)
Chloride: 103 mmol/L (ref 98–111)
Creatinine, Ser: 0.81 mg/dL (ref 0.44–1.00)
GFR, Estimated: >60 mL/min (ref >60)
Glucose, Bld: 110 mg/dL — ABNORMAL HIGH (ref 70–99)
Potassium: 3.5 mmol/L (ref 3.5–5.1)
Sodium: 137 mmol/L (ref 135–145)

## 2023-09-14 LAB — CBC
HCT: 46.7 % — ABNORMAL HIGH (ref 36.0–46.0)
Hemoglobin: 14.5 g/dL (ref 12.0–15.0)
MCH: 29.8 pg (ref 26.0–34.0)
MCHC: 31 g/dL (ref 30.0–36.0)
MCV: 95.9 fL (ref 80.0–100.0)
Platelets: 191 10*3/uL (ref 150–400)
RBC: 4.87 MIL/uL (ref 3.87–5.11)
RDW: 14 % (ref 11.5–15.5)
WBC: 7.2 10*3/uL (ref 4.0–10.5)
nRBC: 0 % (ref 0.0–0.2)

## 2023-09-14 LAB — SURGICAL PCR SCREEN
MRSA, PCR: NEGATIVE
Staphylococcus aureus: NEGATIVE

## 2023-09-14 NOTE — Progress Notes (Addendum)
 Pt arrived for PST appointment. She informed me that she had been to the ER in [place] last night d/t A-Fib. They tried to use medicine without success. She said they had to do cardioversion. Pt. Feels fine today. Denies pressure in her chest like yesterday. VSS. Shanda Bumps PA notified.

## 2023-09-15 NOTE — Progress Notes (Signed)
 Anesthesia Chart Review   Case: [id number] Date/Time: 09/21/23 1201   Procedure: LEFT TOTAL HIP ARTHROPLASTY ANTERIOR APPROACH (Left: Hip)   Anesthesia type: Spinal   Pre-op diagnosis: LEFT HIP DEGENERATIVE JOINT DISEASE   Location: [facility] ROOM 07 / [facility]   Surgeons: Marcene Corning, MD       DISCUSSION:73 y.o. never smoker with h/o PONV, HTN, sleep apnea w/cpap, a-fib, breast cancer, left hip djd scheduled for above procedure 09/21/2023 with Dr. Marcene Corning.   Pt seen by cardiology 08/15/2023 for preoperative evaluation. Per OV note, "Her perioperative risk of a major cardiac event is 0.4% according to the Revised Cardiac Risk Index (RCRI).  Therefore, the patient is at low risk for perioperative complications.   The patient's  functional capacity is fair at 5.07 METs 9 (activity limited only by her hip) according to the Duke Activity Status Index (DASI). Recommendations: According to ACC/AHA guidelines, no further cardiovascular testing needed.  The patient may proceed to surgery at acceptable risk."  Pt reports advised to hold Eliquis three days before procedure, last dose March 2, PM dose.   Echo 06/02/11 (Per Dr. Jacinto Halim notes): Normal LVEF. Mild LVH. No significant valvular abnormalities.    Lexiscan stress 05/27/11 (Per Dr. Jacinto Halim notes): Normal perfusion. Normal LVEF. No ischemia. VS: BP 124/77   Pulse (!) 59   Temp 36.6 C (Oral)   Resp 16   Ht 5\' 8"  (1.727 m)   Wt 116.1 kg   SpO2 97%   BMI 38.92 kg/m   PROVIDERS: Farris Has, MD is PCP   Cardiologist - Dr. Armanda Magic  LABS: Labs reviewed: Acceptable for surgery. (all labs ordered are listed, but only abnormal results are displayed)  Labs Reviewed  BASIC METABOLIC PANEL - Abnormal; Notable for the following components:      Result Value   Glucose, Bld 110 (*)    All other components within normal limits  CBC - Abnormal; Notable for the following components:   HCT 46.7 (*)    All other components within normal  limits  SURGICAL PCR SCREEN  TYPE AND SCREEN     IMAGES:   EKG:   CV:  Past Medical History:  Diagnosis Date   A-fib (HCC)    Arthritis    B12 deficiency    Back pain    Breast cancer (HCC)    Cancer (HCC)    Right Breast Cancer   Chronic headaches    Dysrhythmia    AF   Edema, lower extremity    Gallbladder disease    GERD (gastroesophageal reflux disease)    Hyperlipidemia    Hypertension    Hypothyroidism    Joint pain    Obesity    Pneumonia    PONV (postoperative nausea and vomiting)    Sleep apnea    cpcap   Vitamin deficiency     Past Surgical History:  Procedure Laterality Date   BREAST SURGERY     CARDIOVERSION N/A 06/26/2014   Procedure: CARDIOVERSION;  Surgeon: Pamella Pert, MD;  Location: [facility] ENDOSCOPY;  Service: Cardiovascular;  Laterality: N/A;   CATARACT EXTRACTION     CHOLECYSTECTOMY     DILATION AND CURETTAGE OF UTERUS     eyelid surgery     TOTAL KNEE ARTHROPLASTY Right 05/03/2016   Procedure: TOTAL KNEE ARTHROPLASTY;  Surgeon: Marcene Corning, MD;  Location: [facility] OR;  Service: Orthopedics;  Laterality: Right;   TOTAL KNEE ARTHROPLASTY Left 08/14/2018   Procedure: TOTAL KNEE ARTHROPLASTY;  Surgeon: Jerl Santos,  Theron Arista, MD;  Location: [facility] OR;  Service: Orthopedics;  Laterality: Left;    MEDICATIONS:  acetaminophen (TYLENOL) 650 MG CR tablet   Biotin 5000 MCG CAPS   Calcium Carb-Cholecalciferol (CALCIUM 600 + D PO)   cholecalciferol (VITAMIN D3) 25 MCG (1000 UNIT) tablet   ELIQUIS 5 MG TABS tablet   furosemide (LASIX) 40 MG tablet   gabapentin (NEURONTIN) 300 MG capsule   levothyroxine (SYNTHROID, LEVOTHROID) 75 MCG tablet   oxybutynin (DITROPAN-XL) 10 MG 24 hr tablet   rosuvastatin (CRESTOR) 10 MG tablet   vitamin B-12 (CYANOCOBALAMIN) 1000 MCG tablet   zonisamide (ZONEGRAN) 25 MG capsule   No current facility-administered medications for this encounter.    Robin Cipro Ward, PA-C [facility] Pre-Surgical Testing [phone]

## 2023-09-20 NOTE — Telephone Encounter (Signed)
 Per insurance, appeal for Zonisamide has been denied:

## 2023-09-21 ENCOUNTER — Ambulatory Visit (HOSPITAL_COMMUNITY)
Admission: RE | Admit: 2023-09-21 | Payer: Medicare (Managed Care) | Source: Home / Self Care | Admitting: Orthopaedic Surgery

## 2023-09-21 LAB — TYPE AND SCREEN
ABO/RH(D): O POS
Antibody Screen: NEGATIVE

## 2023-09-22 ENCOUNTER — Ambulatory Visit: Payer: Medicare (Managed Care) | Admitting: Rehabilitative and Restorative Service Providers"

## 2023-09-27 NOTE — Telephone Encounter (Signed)
 Patient advised of Dr.Jaffe, I guess there isn't anything else we can do. Robin Christensen, please contact patient to let her know despite letter of medical necessity, her insurance is still denying the zonisamide.  Therefore, she can just discontinue it (tapering not needed).  Options include: 1. Seeing how she does off a medication 2. Starting a muscle relaxer.  If she would like to start a muscle relaxer, please send in prescription for tizanidine 5mg  tablet:  TAKE 1 TABLET AT BEDTIME FOR ONE WEEK, THEN 1 TABLET TWICE DAILY FOR ONE WEEK, THEN 1 TABLET THREE TIMES DAILY     Patient will finish what she has on hand and see how she do off the medication. Patient has na appt on 10/16/23.

## 2023-10-02 NOTE — Progress Notes (Unsigned)
 Cardiology Office Note:  .   Date:  10/03/2023  ID:  Dallas Schimke, DOB 03/11/1951, MRN [id number] PCP: Farris Has, MD  [facility] Providers Cardiologist:  None {  History of Present Illness: .   EDWENA MAYORGA is a 73 y.o. female with a past medical history of atrial fibrillation had breast cancer, GERD, hypertension, hyperlipidemia, sleep apnea on CPAP, and PAF here for follow-up appointment.  She was last seen August 07, 2023 by Dr. Mayford Knife for preoperative clearance.  She was seen by Dr. Jacinto Halim for atrial fibrillation and apparently had a cardioversion in 2015.  She denied any chest pain/pressure, SOB, DOE, PND, orthopnea, lower extreme edema, dizziness, or syncope at her last appointment.  At times she does have palpitations but they only last for a few seconds and then resolved.  No further atrial fibrillation.  Compliant with medications.  Today, she presents, with a history of atrial fibrillation (AFib), is seeking preoperative clearance for a left hip replacement. The surgery was previously scheduled but was cancelled due to an episode of AFib. The patient experienced a prolonged AFib attack that required cardioversion in the emergency room. Following the episode, the patient reported brief periods of AFib lasting up to five minutes, but these resolved after a week. The patient's medications were not changed following the AFib episode.  The patient also reported a possible trigger for the AFib episode, including a meal high in bacon (salt) and caffeinated diet coke. The patient typically avoids caffeinated drinks due to a previous AFib episode.  [name] is significantly limited due to hip pain, and she is unable to walk one to two blocks or climb stairs. She is able to perform household tasks with difficulty and is not currently engaging in recreational activities. She does meet 4 mets on the DASI  The patient also mentioned a potential interest in a  coronary calcium score test offered during a mammogram, as her father had coronary disease.  Reports no shortness of breath nor dyspnea on exertion. Reports no chest pain, pressure, or tightness. No edema, orthopnea, PND. Reports no palpitations.   Discussed the use of AI scribe software for clinical note transcription with the patient, who gave verbal consent to proceed.  ROS: Pertinent ROS in HPI  Studies Reviewed: .        None.      Physical Exam:   VS:  BP 110/76   Pulse (!) 57   Ht 5\' 8"  (1.727 m)   Wt 253 lb (114.8 kg)   SpO2 93%   BMI 38.47 kg/m    Wt Readings from Last 3 Encounters:  10/03/23 253 lb (114.8 kg)  09/14/23 256 lb (116.1 kg)  08/15/23 263 lb (119.3 kg)    GEN: Well nourished, well developed in no acute distress NECK: No JVD; No carotid bruits CARDIAC: RRR, no murmurs, rubs, gallops RESPIRATORY:  Clear to auscultation without rales, wheezing or rhonchi  ABDOMEN: Soft, non-tender, non-distended EXTREMITIES:  No edema; No deformity   ASSESSMENT AND PLAN: .    Atrial Fibrillation (AFib) Recent AFib episode required cardioversion. Heart rate limits beta blocker use. No issues with Eliquis. Possible triggers include high caffeine and salt intake. No new chest pain or dyspnea. Preoperative clearance needed for hip replacement surgery. - Proceed with preoperative clearance for hip replacement surgery. No further cardiovascular testing needed  - Continue current medications, including Eliquis. - Hold Eliquis for 3 days prior to surgery. Please resume when medically safe to  do so. - Hold vitamins and NSAIDs for 1 week prior to surgery. - Document clearance in note and fax to surgical team.  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 Significant mobility limitation. Anticipated fast recovery post-surgery with physical therapy. - Proceed with hip replacement surgery after clearance. - Plan for postoperative  physical therapy to regain mobility and strength.  Coronary Artery Disease Risk Assessment Family history of coronary artery disease. Discussed coronary calcium score for early detection. A calcium score of zero indicates low risk. - Encourage scheduling a coronary calcium score test.  Follow-up Follow-up visit to be scheduled in six months to assess recovery post-surgery and overall health status. - Schedule follow-up appointment in six months at the new building.     Dispo: She can follow-up in 6 months with Dr. Mayford Knife.  Signed, Sharlene Dory, PA-C

## 2023-10-03 ENCOUNTER — Ambulatory Visit: Payer: Medicare (Managed Care) | Attending: Physician Assistant | Admitting: Physician Assistant

## 2023-10-03 ENCOUNTER — Encounter: Payer: Self-pay | Admitting: Physician Assistant

## 2023-10-03 VITALS — BP 110/76 | HR 57 | Ht 68.0 in | Wt 253.0 lb

## 2023-10-03 DIAGNOSIS — Z01818 Encounter for other preprocedural examination: Secondary | ICD-10-CM

## 2023-10-03 DIAGNOSIS — Z79899 Other long term (current) drug therapy: Secondary | ICD-10-CM | POA: Diagnosis not present

## 2023-10-03 DIAGNOSIS — I10 Essential (primary) hypertension: Secondary | ICD-10-CM

## 2023-10-03 DIAGNOSIS — I480 Paroxysmal atrial fibrillation: Secondary | ICD-10-CM

## 2023-10-03 MED ORDER — ROSUVASTATIN CALCIUM 10 MG PO TABS: 10.0000 mg | ORAL_TABLET | Freq: Every day | ORAL | 3 refills | Status: DC

## 2023-10-03 MED ORDER — APIXABAN 5 MG PO TABS: 5.0000 mg | ORAL_TABLET | Freq: Two times a day (BID) | ORAL | 0 refills | Status: DC

## 2023-10-03 MED ORDER — FUROSEMIDE 40 MG PO TABS: 40.0000 mg | ORAL_TABLET | Freq: Every day | ORAL | 3 refills | Status: DC

## 2023-10-03 NOTE — Patient Instructions (Signed)
 Medication Instructions:  Your physician recommends that you continue on your current medications as directed. Please refer to the Current Medication list given to you today.  *If you need a refill on your cardiac medications before your next appointment, please call your pharmacy*   Lab Work: NONE If you have labs (blood work) drawn today and your tests are completely normal, you will receive your results only by: MyChart Message (if you have MyChart) OR A paper copy in the mail If you have any lab test that is abnormal or we need to change your treatment, we will call you to review the results.   Testing/Procedures: NONE   Follow-Up: At [facility], you and your health needs are our priority.  As part of our continuing mission to provide you with exceptional heart care, we have created designated Provider Care Teams.  These Care Teams include your primary Cardiologist (physician) and Advanced Practice Providers (APPs -  Physician Assistants and Nurse Practitioners) who all work together to provide you with the care you need, when you need it.  We recommend signing up for the patient portal called "MyChart".  Sign up information is provided on this After Visit Summary.  MyChart is used to connect with patients for Virtual Visits (Telemedicine).  Patients are able to view lab/test results, encounter notes, upcoming appointments, etc.  Non-urgent messages can be sent to your provider as well.   To learn more about what you can do with MyChart, go to [email or web address].    Your next appointment:   6 month(s)  Provider:   Mayford Knife, MD  Other Instructions   1st Floor: - Lobby - Registration  - Pharmacy  - Lab - Cafe  2nd Floor: - PV Lab - Diagnostic Testing (echo, CT, nuclear med)  3rd Floor: - Vacant  4th Floor: - TCTS (cardiothoracic surgery) - AFib Clinic - Structural Heart Clinic - Vascular Surgery  - Vascular Ultrasound  5th Floor: - HeartCare  Cardiology (general and EP) - Clinical Pharmacy for coumadin, hypertension, lipid, weight-loss medications, and med management appointments    Valet parking services will be available as well.

## 2023-10-12 NOTE — Telephone Encounter (Signed)
 Faxed over note from office visit 10/03/23 to surgeon's office.  Pt aware.

## 2023-10-12 NOTE — Progress Notes (Signed)
 NEUROLOGY FOLLOW UP OFFICE NOTE  Robin Christensen [id number]  Assessment/Plan:    Episodic tension-type headache, not intractable     Headache prevention:  start sertraline 25mg  daily.  We can increase dose in 4 weeks if needed. Headache rescue:  Extra-strength Tylenol with coffee Limit use of pain relievers to no more than 2 days out of week to prevent risk of rebound or medication-overuse headache. Keep headache diary Follow up in 6 months.   Subjective:  Robin Christensen is a 73 year old right-handed female with hypothyroidism, hyperlipidemia, hypertension, OSA, atrial fibrillation status post cardioversion and history of breast cancer status post mastectomy who follows up for headache.   UPDATE: Increased zonisamide to 75mg  daily.  She did very well.  However, her insurance denied further coverage, even with an appeal submitting letter of medical necessity.  Recommended starting tizanidine, however she does not feel comfortable about starting a muscle relaxant that can cause drowsiness.    She followed up with the dentist who didn't believe that she had TMJ dysfunction.    Intensity:  They are now severe (feels like head is going to explode) Duration:  May last for hours Frequency:   2 days a week but may skip a week. Over the last year, has had about 3 episodes of vertigo. Current NSAIDS: None Current analgesics: Tylenol Arthritis (takes 3 times a day for her arthritis) Current triptans: None Current ergotamine: None Current anti-emetic: None Current muscle relaxants: None Current anti-anxiolytic: None Current sleep aide: None Current Antihypertensive medications: Lasix Current Antidepressant medications: none Current Anticonvulsant medications: gabapentin 300mg /300mg /600mg  (for back pain) Current anti-CGRP: None Current Vitamins/Herbal/Supplements: KCl, B12 Current Antihistamines/Decongestants: None Other therapy: None Other medications: levothyroxine,  Eliqis   Caffeine: No Alcohol: No Smoker: No Diet: Hydrates Exercise: Walks 3 miles 5 days a week Depression: No; Anxiety: No Other pain: Lumbar spinal stenosis.   Sleep hygiene: Sleeps 4 to 5 hours straight, wakes up for 2 hours and then sleeps for another 2 hours.  She has OSA which is successfully treated with CPAP.    HISTORY: Onset:  2018.  At the time, they were severe and daily.  It was found that her OSA was uncontrolled and CPAP was adjusted.  Headaches resolved but returned about 2 months ago, albeit less severe.  CPAP was rechecked and is adequate. Location:  Bifrontal/across top of head Quality:  pressure Initial intensity:  mild.  She denies new headache, thunderclap headache or severe headache that wakes her from sleep. Aura:  no Prodrome:  no Postdrome:  no Associated symptoms:  None. Initially some nausea a year ago.  Now without nausea, vomiting, photophobia, phonophobia, autonomic symptoms or visual disturbance.  She denies associated unilateral numbness or weakness. Initial Duration:  1 hour to all day Initial Frequency:  3 days a week Initial Frequency of abortive medication: Extra-strength Tylenol 3 days a week Triggers:  Emotional stress Relieving factors:  When occupied by other activities Activity:  Does not aggravate She endorses aching pain in her jaw, right worse than left.  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  She hasn't seen a dentist in years.  She has seen ophthalmology and had cataract surgery this year.  Vision is improved.     MRI of brain without and with contrast from 01/15/18 was personally reviewed and was unremarkable except for incidental partial empty sella.   Past NSAIDS: ibuprofen, naproxen Past analgesics:  no Past abortive triptans:  no Past muscle relaxants:  no Past anti-emetic:  no Past antihypertensive medications:  Toprol XL Past antidepressant medications:  Nortriptyline 10mg  (hunger/weight gain,  sluggishness), Cymbalta 60mg  Past anticonvulsant medications:   Topiramate 25 mg at bedtime (discontinued due to cognitive deficits), zonisamide 75mg  daily (effective but not covered by insurance) Past vitamins/Herbal/Supplements:  no Past antihistamines/decongestants:  no Other past therapies:  no   She has history of headaches off and on throughout her life.  Family history of headache:  No  PAST MEDICAL HISTORY: Past Medical History:  Diagnosis Date   A-fib (HCC)    Arthritis    B12 deficiency    Back pain    Breast cancer (HCC)    Cancer (HCC)    Right Breast Cancer   Chronic headaches    Dysrhythmia    AF   Edema, lower extremity    Gallbladder disease    GERD (gastroesophageal reflux disease)    Hyperlipidemia    Hypertension    Hypothyroidism    Joint pain    Obesity    Pneumonia    PONV (postoperative nausea and vomiting)    Sleep apnea    cpcap   Vitamin deficiency     MEDICATIONS: Current Outpatient Medications on File Prior to Visit  Medication Sig Dispense Refill   acetaminophen (TYLENOL) 650 MG CR tablet Take 1,300 mg by mouth every 8 (eight) hours as needed for pain.     apixaban (ELIQUIS) 5 MG TABS tablet Take 1 tablet (5 mg total) by mouth 2 (two) times daily. 180 tablet 0   Biotin 5000 MCG CAPS Take 5,000 mcg by mouth daily.     Calcium Carb-Cholecalciferol (CALCIUM 600 + D PO) Take 2 tablets by mouth daily.     cholecalciferol (VITAMIN D3) 25 MCG (1000 UNIT) tablet Take 1,000 Units by mouth daily.     furosemide (LASIX) 40 MG tablet Take 1 tablet (40 mg total) by mouth daily. 90 tablet 3   gabapentin (NEURONTIN) 300 MG capsule Take 300-600 mg by mouth See admin instructions. 300 mg  in the morning, 300 mg  in afternoon, and 600 mg at bedtime     levothyroxine (SYNTHROID, LEVOTHROID) 75 MCG tablet Take 75 mcg by mouth daily before breakfast.     oxybutynin (DITROPAN-XL) 10 MG 24 hr tablet Take 10 mg by mouth daily.     rosuvastatin (CRESTOR) 10 MG  tablet Take 1 tablet (10 mg total) by mouth daily. 90 tablet 3   vitamin B-12 (CYANOCOBALAMIN) 1000 MCG tablet Take 1,000 mcg by mouth daily.     zonisamide (ZONEGRAN) 25 MG capsule Take 3 capsules (75 mg total) by mouth daily. (Patient not taking: Reported on 10/03/2023) 90 capsule 5   No current facility-administered medications on file prior to visit.    ALLERGIES: Allergies  Allergen Reactions   Rivaroxaban Other (See Comments)    JOINT ACHES/PAIN     FAMILY HISTORY: Family History  Problem Relation Age of Onset   High blood pressure Mother    Heart disease Mother    Cancer Mother    Depression Mother    Obesity Mother    Heart disease Father    Kidney disease Father    Cancer Father       Objective:  Blood pressure 137/69, pulse 62, height 5\' 8"  (1.727 m), weight 257 lb (116.6 kg), SpO2 95%. General: No acute distress.  Patient appears well-groomed.     Shon Millet, DO  CC: Farris Has, MD

## 2023-10-12 NOTE — Telephone Encounter (Signed)
 Pt calling to f/u on [facility] being that she was just seen on 3/18. Please advise

## 2023-10-16 ENCOUNTER — Encounter: Payer: Self-pay | Admitting: Neurology

## 2023-10-16 ENCOUNTER — Ambulatory Visit: Payer: Medicare (Managed Care) | Admitting: Neurology

## 2023-10-16 VITALS — BP 137/69 | HR 62 | Ht 68.0 in | Wt 257.0 lb

## 2023-10-16 DIAGNOSIS — G44219 Episodic tension-type headache, not intractable: Secondary | ICD-10-CM | POA: Diagnosis not present

## 2023-10-16 MED ORDER — SERTRALINE HCL 25 MG PO TABS: 25.0000 mg | ORAL_TABLET | Freq: Every day | ORAL | 5 refills | Status: DC

## 2023-10-16 NOTE — Patient Instructions (Signed)
 Start sertraline 25mg  daily.  If no improvement in 4 weeks, contact me and we can increase dose

## 2023-10-19 DIAGNOSIS — Z6838 Body mass index (BMI) 38.0-38.9, adult: Secondary | ICD-10-CM | POA: Diagnosis not present

## 2023-10-19 DIAGNOSIS — R7303 Prediabetes: Secondary | ICD-10-CM | POA: Diagnosis not present

## 2023-10-19 DIAGNOSIS — R632 Polyphagia: Secondary | ICD-10-CM | POA: Diagnosis not present

## 2023-10-19 DIAGNOSIS — E785 Hyperlipidemia, unspecified: Secondary | ICD-10-CM | POA: Diagnosis not present

## 2023-10-19 DIAGNOSIS — E66812 Obesity, class 2: Secondary | ICD-10-CM | POA: Diagnosis not present

## 2023-10-19 DIAGNOSIS — E559 Vitamin D deficiency, unspecified: Secondary | ICD-10-CM | POA: Diagnosis not present

## 2023-10-24 ENCOUNTER — Other Ambulatory Visit: Payer: Self-pay | Admitting: Orthopaedic Surgery

## 2023-10-25 NOTE — Patient Instructions (Addendum)
 SURGICAL WAITING ROOM VISITATION Patients having surgery or a procedure may have no more than 2 support people in the waiting area - these visitors may rotate in the visitor waiting room.   If the patient needs to stay at the hospital during part of their recovery, the visitor guidelines for inpatient rooms apply.  PRE-OP VISITATION  Pre-op nurse will coordinate an appropriate time for 1 support person to accompany the patient in pre-op.  This support person may not rotate.  This visitor will be contacted when the time is appropriate for the visitor to come back in the pre-op area.  Please refer to the [facility] website for the visitor guidelines for Inpatients (after your surgery is over and you are in a regular room).  You are not required to quarantine at this time prior to your surgery. However, you must do this: Hand Hygiene often Do NOT share personal items Notify your provider if you are in close contact with someone who has COVID or you develop fever 100.4 or greater, new onset of sneezing, cough, sore throat, shortness of breath or body aches.  If you test positive for Covid or have been in contact with anyone that has tested positive in the last 10 days please notify you surgeon.    Your procedure is scheduled on:  TUESDAY  November 07, 2023  Report to [facility] Main Entrance: [address] entrance where the [employer] is available.   Report to admitting at: 07:15 AM  Call this number if you have any questions or problems the morning of surgery [phone]  Do not eat or drink anything after Midnight the night prior to your surgery/procedure.   FOLLOW ANY ADDITIONAL PRE OP INSTRUCTIONS YOU RECEIVED FROM YOUR SURGEON'S OFFICE!!!   Oral Hygiene is also important to reduce your risk of infection.        Remember - BRUSH YOUR TEETH THE MORNING OF SURGERY WITH YOUR REGULAR TOOTHPASTE  Do NOT smoke after Midnight the night before surgery.  STOP TAKING all Vitamins,  Herbs and supplements 1 week before your surgery.    Eliquis- Stop taking x 3 days before your surgery. Last Dose will be taken on 11-03-23   DO NOT TAKE FUROSEMIDE (LASIX) THE DAY OF SURGERY  11-07-23  Take ONLY these medicines the morning of surgery with A SIP OF WATER: Oxybutynin, levothyroxine, sertraline, gabapentin, and Tylenol if needed.    If You have been diagnosed with Sleep Apnea - Bring CPAP mask and tubing day of surgery. We will provide you with a CPAP machine on the day of your surgery.                   You may not have any metal on your body including hair pins, jewelry, and body piercing  Do not wear make-up, lotions, powders, perfumes or deodorant  Do not wear nail polish including gel and S&S, artificial / acrylic nails, or any other type of covering on natural nails including finger and toenails. If you have artificial nails, gel coating, etc., that needs to be removed by a nail salon, Please have this removed prior to surgery. Not doing so may mean that your surgery could be cancelled or delayed if the Surgeon or anesthesia staff feels like they are unable to monitor you safely.   Do not shave 48 hours prior to surgery to avoid nicks in your skin which may contribute to postoperative infections.   Contacts, Hearing Aids, dentures or bridgework may  not be worn into surgery. DENTURES WILL BE REMOVED PRIOR TO SURGERY PLEASE DO NOT APPLY "Poly grip" OR ADHESIVES!!!  Patients discharged on the day of surgery will not be allowed to drive home.  Someone NEEDS to stay with you for the first 24 hours after anesthesia.  Do not bring your home medications to the hospital. The Pharmacy will dispense medications listed on your medication list to you during your admission in the Hospital.  Special Instructions: Bring a copy of your healthcare power of attorney and living will documents the day of surgery, if you wish to have them scanned into your [facility]-  EPIC  Please read over the following fact sheets you were given: IF YOU HAVE QUESTIONS ABOUT YOUR PRE-OP INSTRUCTIONS, PLEASE CALL [phone].     Pre-operative 5 CHG Bath Instructions   You can play a key role in reducing the risk of infection after surgery. Your skin needs to be as free of germs as possible. You can reduce the number of germs on your skin by washing with CHG (chlorhexidine gluconate) soap before surgery. CHG is an antiseptic soap that kills germs and continues to kill germs even after washing.   DO NOT use if you have an allergy to chlorhexidine/CHG or antibacterial soaps. If your skin becomes reddened or irritated, stop using the CHG and notify one of our RNs at [phone]  Please shower with the CHG soap starting 4 days before surgery using the following schedule: START SHOWERS ON   THURSDAY  November 03, 2023                                                                                                                                                                              Please keep in mind the following:  DO NOT shave, including legs and underarms, starting the day of your first shower.   You may shave your face at any point before/day of surgery.   Place clean sheets on your bed the day you start using CHG soap. Use a clean washcloth (not used since being washed) for each shower. DO NOT sleep with pets once you start using the CHG.   CHG Shower Instructions:  If you choose to wash your hair and private area, wash first with your normal shampoo/soap.  After you use shampoo/soap, rinse your hair and body thoroughly to remove shampoo/soap residue.  Turn the water OFF and apply about 3 tablespoons (45 ml) of CHG soap to a CLEAN washcloth.  Apply CHG soap ONLY FROM YOUR NECK DOWN TO YOUR TOES (washing for 3-5 minutes)  DO NOT use CHG soap on face, private areas, open wounds, or sores.  Pay special attention to  the area where your surgery is being performed.   If you are having back surgery, having someone wash your back for you may be helpful.  Wait 2 minutes after CHG soap is applied, then you may rinse off the CHG soap.  Pat dry with a clean towel  Put on clean clothes/pajamas   If you choose to wear lotion, please use ONLY the CHG-compatible lotions on the back of this paper.     Additional instructions for the day of surgery: DO NOT APPLY any lotions, deodorants, cologne, or perfumes.   Put on clean/comfortable clothes.  Brush your teeth.  Ask your nurse before applying any prescription medications to the skin.      CHG Compatible Lotions   Aveeno Moisturizing lotion  Cetaphil Moisturizing Cream  Cetaphil Moisturizing Lotion  Clairol Herbal Essence Moisturizing Lotion, Dry Skin  Clairol Herbal Essence Moisturizing Lotion, Extra Dry Skin  Clairol Herbal Essence Moisturizing Lotion, Normal Skin  Curel Age Defying Therapeutic Moisturizing Lotion with Alpha Hydroxy  Curel Extreme Care Body Lotion  Curel Soothing Hands Moisturizing Hand Lotion  Curel Therapeutic Moisturizing Cream, Fragrance-Free  Curel Therapeutic Moisturizing Lotion, Fragrance-Free  Curel Therapeutic Moisturizing Lotion, Original Formula  Eucerin Daily Replenishing Lotion  Eucerin Dry Skin Therapy Plus Alpha Hydroxy Crme  Eucerin Dry Skin Therapy Plus Alpha Hydroxy Lotion  Eucerin Original Crme  Eucerin Original Lotion  Eucerin Plus Crme Eucerin Plus Lotion  Eucerin TriLipid Replenishing Lotion  Keri Anti-Bacterial Hand Lotion  Keri Deep Conditioning Original Lotion Dry Skin Formula Softly Scented  Keri Deep Conditioning Original Lotion, Fragrance Free Sensitive Skin Formula  Keri Lotion Fast Absorbing Fragrance Free Sensitive Skin Formula  Keri Lotion Fast Absorbing Softly Scented Dry Skin Formula  Keri Original Lotion  Keri Skin Renewal Lotion Keri Silky Smooth Lotion  Keri Silky Smooth Sensitive Skin Lotion  Nivea Body Creamy Conditioning Oil  Nivea  Body Extra Enriched Lotion  Nivea Body Original Lotion  Nivea Body Sheer Moisturizing Lotion Nivea Crme  Nivea Skin Firming Lotion  NutraDerm 30 Skin Lotion  NutraDerm Skin Lotion  NutraDerm Therapeutic Skin Cream  NutraDerm Therapeutic Skin Lotion  ProShield Protective Hand Cream  Provon moisturizing lotion   FAILURE TO FOLLOW THESE INSTRUCTIONS MAY RESULT IN THE CANCELLATION OF YOUR SURGERY  PATIENT SIGNATURE_________________________________  NURSE SIGNATURE__________________________________  ________________________________________________________________________        Rogelia Mire    An incentive spirometer is a tool that can help keep your lungs clear and active. This tool measures how well you are filling your lungs with each breath. Taking long deep breaths may help reverse or decrease the chance of developing breathing (pulmonary) problems (especially infection) following: A long period of time when you are unable to move or be active. BEFORE THE PROCEDURE  If the spirometer includes an indicator to show your best effort, your nurse or respiratory therapist will set it to a desired goal. If possible, sit up straight or lean slightly forward. Try not to slouch. Hold the incentive spirometer in an upright position. INSTRUCTIONS FOR USE  Sit on the edge of your bed if possible, or sit up as far as you can in bed or on a chair. Hold the incentive spirometer in an upright position. Breathe out normally. Place the mouthpiece in your mouth and seal your lips tightly around it. Breathe in slowly and as deeply as possible, raising the piston or the ball toward the top of the column. Hold your breath for 3-5 seconds or for as long as  possible. Allow the piston or ball to fall to the bottom of the column. Remove the mouthpiece from your mouth and breathe out normally. Rest for a few seconds and repeat Steps 1 through 7 at least 10 times every 1-2 hours when you are  awake. Take your time and take a few normal breaths between deep breaths. The spirometer may include an indicator to show your best effort. Use the indicator as a goal to work toward during each repetition. After each set of 10 deep breaths, practice coughing to be sure your lungs are clear. If you have an incision (the cut made at the time of surgery), support your incision when coughing by placing a pillow or rolled up towels firmly against it. Once you are able to get out of bed, walk around indoors and cough well. You may stop using the incentive spirometer when instructed by your caregiver.  RISKS AND COMPLICATIONS Take your time so you do not get dizzy or light-headed. If you are in pain, you may need to take or ask for pain medication before doing incentive spirometry. It is harder to take a deep breath if you are having pain. AFTER USE Rest and breathe slowly and easily. It can be helpful to keep track of a log of your progress. Your caregiver can provide you with a simple table to help with this. If you are using the spirometer at home, follow these instructions: SEEK MEDICAL CARE IF:  You are having difficultly using the spirometer. You have trouble using the spirometer as often as instructed. Your pain medication is not giving enough relief while using the spirometer. You develop fever of 100.5 F (38.1 C) or higher.                                                                                                    SEEK IMMEDIATE MEDICAL CARE IF:  You cough up bloody sputum that had not been present before. You develop fever of 102 F (38.9 C) or greater. You develop worsening pain at or near the incision site. MAKE SURE YOU:  Understand these instructions. Will watch your condition. Will get help right away if you are not doing well or get worse. Document Released: 11/14/2006 Document Revised: 09/26/2011 Document Reviewed: 01/15/2007 [facility] Patient Information 2014 [place],  [place].        WHAT IS A BLOOD TRANSFUSION? Blood Transfusion Information  A transfusion is the replacement of blood or some of its parts. Blood is made up of multiple cells which provide different functions. Red blood cells carry oxygen and are used for blood loss replacement. White blood cells fight against infection. Platelets control bleeding. Plasma helps clot blood. Other blood products are available for specialized needs, such as hemophilia or other clotting disorders. BEFORE THE TRANSFUSION  Who gives blood for transfusions?  Healthy volunteers who are fully evaluated to make sure their blood is safe. This is blood bank blood. Transfusion therapy is the safest it has ever been in the practice of medicine. Before blood is taken from a donor, a complete history is taken  to make sure that person has no history of diseases nor engages in risky social behavior (examples are intravenous drug use or sexual activity with multiple partners). The donor's travel history is screened to minimize risk of transmitting infections, such as malaria. The donated blood is tested for signs of infectious diseases, such as HIV and hepatitis. The blood is then tested to be sure it is compatible with you in order to minimize the chance of a transfusion reaction. If you or a relative donates blood, this is often done in anticipation of surgery and is not appropriate for emergency situations. It takes many days to process the donated blood. RISKS AND COMPLICATIONS Although transfusion therapy is very safe and saves many lives, the main dangers of transfusion include:  Getting an infectious disease. Developing a transfusion reaction. This is an allergic reaction to something in the blood you were given. Every precaution is taken to prevent this. The decision to have a blood transfusion has been considered carefully by your caregiver before blood is given. Blood is not given unless the benefits outweigh the  risks. AFTER THE TRANSFUSION Right after receiving a blood transfusion, you will usually feel much better and more energetic. This is especially true if your red blood cells have gotten low (anemic). The transfusion raises the level of the red blood cells which carry oxygen, and this usually causes an energy increase. The nurse administering the transfusion will monitor you carefully for complications. HOME CARE INSTRUCTIONS  No special instructions are needed after a transfusion. You may find your energy is better. Speak with your caregiver about any limitations on activity for underlying diseases you may have. SEEK MEDICAL CARE IF:  Your condition is not improving after your transfusion. You develop redness or irritation at the intravenous (IV) site. SEEK IMMEDIATE MEDICAL CARE IF:  Any of the following symptoms occur over the next 12 hours: Shaking chills. You have a temperature by mouth above 102 F (38.9 C), not controlled by medicine. Chest, back, or muscle pain. People around you feel you are not acting correctly or are confused. Shortness of breath or difficulty breathing. Dizziness and fainting. You get a rash or develop hives. You have a decrease in urine output. Your urine turns a dark color or changes to pink, red, or brown. Any of the following symptoms occur over the next 10 days: You have a temperature by mouth above 102 F (38.9 C), not controlled by medicine. Shortness of breath. Weakness after normal activity. The white part of the eye turns yellow (jaundice). You have a decrease in the amount of urine or are urinating less often. Your urine turns a dark color or changes to pink, red, or brown. Document Released: 07/01/2000 Document Revised: 09/26/2011 Document Reviewed: 02/18/2008 [facility] Patient Information 2014 [place], [place].  _______________________________________________________________________        If you would like to see a video about joint  replacement:   [email or web address]

## 2023-10-25 NOTE — Progress Notes (Signed)
 COVID Vaccine received:  []  No [x]  Yes Date of any COVID positive Test in last 90 days:  PCP - Farris Has, MD  at [facility]  [phone]  Cardiologist - Armanda Magic, MD , Jari Favre, PA-C, cardiac clearance in 10-03-23 Epic note Neurology-  Shon Millet, MD   Chest x-ray - 08-07-2018  2v  Epic EKG -  08-15-2023  Epic Stress Test -  ECHO -  Cardiac Cath -   Pacemaker / ICD device [x]  No []  Yes   Spinal Cord Stimulator:[x]  No []  Yes       History of Sleep Apnea? []  No [x]  Yes  Study 2019 CPAP used?- []  No [x]  Yes    Does the patient monitor blood sugar?   [x]  N/A   []  No []  Yes  Patient has: [x]  NO Hx DM   []  Pre-DM   []  DM1  []   DM2 Last A1c was: 5.4  on   03-12-2020    Blood Thinner / Instructions:   Eliquis  hold x 3 days  Last Dose will be taken on 11-03-23 Aspirin Instructions   none  ERAS Protocol Ordered: []  No  []  Yes PRE-SURGERY []  ENSURE  []  G2   []  No Drink Ordered  Patient is to be NPO after:    No orders, VM to [place]  Dental hx: []  Dentures:  []  N/A      []  Bridge or Partial:                   []  Loose or Damaged teeth:   Comments: Patient was given the 5 CHG shower / bath instructions for THA surgery along with 2 bottles of the CHG soap. Patient will start this on:  11-03-23  All questions were asked and answered, Patient voiced understanding of this process.   Activity level: Patient is able / unable to climb a flight of stairs without difficulty; []  No CP  []  No SOB, but would have ___   Patient can / can not perform ADLs without assistance.   Anesthesia review: A.fib (had DCCV 09-12-23 at [facility]), HTN, GERD, PONV, OSA-CPAP, depression, HOH-    Patient denies shortness of breath, fever, cough and chest pain at PAT appointment.  Patient verbalized understanding and agreement to the Pre-Surgical Instructions that were given to them at this PAT appointment. Patient was also educated of the need to review these PAT instructions again prior to her surgery.I  reviewed the appropriate phone numbers to call if they have any and questions or concerns.

## 2023-10-26 ENCOUNTER — Other Ambulatory Visit: Payer: Self-pay

## 2023-10-26 ENCOUNTER — Encounter (HOSPITAL_COMMUNITY)
Admission: RE | Admit: 2023-10-26 | Discharge: 2023-10-26 | Disposition: A | Discharge status: Home / Self Care | Payer: Medicare (Managed Care) | Source: Ambulatory Visit | Attending: Orthopaedic Surgery | Admitting: Orthopaedic Surgery

## 2023-10-26 ENCOUNTER — Encounter (HOSPITAL_COMMUNITY): Payer: Self-pay

## 2023-10-26 VITALS — BP 120/62 | HR 62 | Temp 98.2°F | Resp 16 | Ht 68.0 in | Wt 249.0 lb

## 2023-10-26 DIAGNOSIS — M1612 Unilateral primary osteoarthritis, left hip: Secondary | ICD-10-CM

## 2023-10-26 DIAGNOSIS — Z01812 Encounter for preprocedural laboratory examination: Secondary | ICD-10-CM | POA: Insufficient documentation

## 2023-10-26 DIAGNOSIS — I10 Essential (primary) hypertension: Secondary | ICD-10-CM | POA: Diagnosis not present

## 2023-10-26 DIAGNOSIS — Z7901 Long term (current) use of anticoagulants: Secondary | ICD-10-CM

## 2023-10-26 DIAGNOSIS — Z01818 Encounter for other preprocedural examination: Secondary | ICD-10-CM

## 2023-10-26 DIAGNOSIS — I4891 Unspecified atrial fibrillation: Secondary | ICD-10-CM

## 2023-10-26 LAB — BASIC METABOLIC PANEL WITH GFR
Anion gap: 9 (ref 5–15)
BUN: 18 mg/dL (ref 8–23)
CO2: 25 mmol/L (ref 22–32)
Calcium: 9.3 mg/dL (ref 8.9–10.3)
Chloride: 104 mmol/L (ref 98–111)
Creatinine, Ser: 0.68 mg/dL (ref 0.44–1.00)
GFR, Estimated: >60 mL/min (ref >60)
Glucose, Bld: 101 mg/dL — ABNORMAL HIGH (ref 70–99)
Potassium: 3.8 mmol/L (ref 3.5–5.1)
Sodium: 138 mmol/L (ref 135–145)

## 2023-10-26 LAB — CBC
HCT: 43.9 % (ref 36.0–46.0)
Hemoglobin: 13.8 g/dL (ref 12.0–15.0)
MCH: 30.3 pg (ref 26.0–34.0)
MCHC: 31.4 g/dL (ref 30.0–36.0)
MCV: 96.5 fL (ref 80.0–100.0)
Platelets: 189 10*3/uL (ref 150–400)
RBC: 4.55 MIL/uL (ref 3.87–5.11)
RDW: 13.7 % (ref 11.5–15.5)
WBC: 8.5 10*3/uL (ref 4.0–10.5)
nRBC: 0 % (ref 0.0–0.2)

## 2023-10-26 LAB — SURGICAL PCR SCREEN
MRSA, PCR: NEGATIVE
Staphylococcus aureus: NEGATIVE

## 2023-10-26 LAB — TYPE AND SCREEN
ABO/RH(D): O POS
Antibody Screen: NEGATIVE

## 2023-10-27 NOTE — Anesthesia Preprocedure Evaluation (Signed)
 Anesthesia Evaluation    Airway        Dental   Pulmonary           Cardiovascular hypertension,      Neuro/Psych    GI/Hepatic   Endo/Other    Renal/GU      Musculoskeletal   Abdominal   Peds  Hematology   Anesthesia Other Findings   Reproductive/Obstetrics                              Anesthesia Physical Anesthesia Plan  ASA:   Anesthesia Plan:    Post-op Pain Management:    Induction:   PONV Risk Score and Plan:   Airway Management Planned:   Additional Equipment:   Intra-op Plan:   Post-operative Plan:   Informed Consent:   Plan Discussed with:   Anesthesia Plan Comments: (See recent note by Christeen Douglas 09/14/2023.  Surgery was subsequently postponed due to patient having prolonged episode of A-fib requiring cardioversion at [facility] ED.  Patient did subsequently follow-up with Jari Favre, PA-C on 10/03/2023 for preop eval.  Per note, "Recent AFib episode required cardioversion. Heart rate limits beta blocker use. No issues with Eliquis. Possible triggers include high caffeine and salt intake. No new chest pain or dyspnea. Preoperative clearance needed for hip replacement surgery. - Proceed with preoperative clearance for hip replacement surgery. No further cardiovascular testing needed  - Continue current medications, including Eliquis. - Hold Eliquis for 3 days prior to surgery. Please resume when medically safe to do so. - Hold vitamins and NSAIDs for 1 week prior to surgery. - Document clearance in note and fax to surgical team." )         Anesthesia Quick Evaluation

## 2023-10-30 DIAGNOSIS — R188 Other ascites: Secondary | ICD-10-CM | POA: Diagnosis not present

## 2023-10-30 DIAGNOSIS — R109 Unspecified abdominal pain: Secondary | ICD-10-CM | POA: Diagnosis not present

## 2023-10-30 DIAGNOSIS — R1013 Epigastric pain: Secondary | ICD-10-CM | POA: Diagnosis not present

## 2023-10-30 DIAGNOSIS — Z9049 Acquired absence of other specified parts of digestive tract: Secondary | ICD-10-CM | POA: Diagnosis not present

## 2023-10-30 DIAGNOSIS — R5383 Other fatigue: Secondary | ICD-10-CM | POA: Diagnosis not present

## 2023-10-30 DIAGNOSIS — K838 Other specified diseases of biliary tract: Secondary | ICD-10-CM | POA: Diagnosis not present

## 2023-10-30 DIAGNOSIS — R9431 Abnormal electrocardiogram [ECG] [EKG]: Secondary | ICD-10-CM | POA: Diagnosis not present

## 2023-10-30 DIAGNOSIS — J029 Acute pharyngitis, unspecified: Secondary | ICD-10-CM | POA: Diagnosis not present

## 2023-10-30 DIAGNOSIS — R509 Fever, unspecified: Secondary | ICD-10-CM | POA: Diagnosis not present

## 2023-10-30 DIAGNOSIS — R7401 Elevation of levels of liver transaminase levels: Secondary | ICD-10-CM | POA: Diagnosis not present

## 2023-10-30 DIAGNOSIS — R300 Dysuria: Secondary | ICD-10-CM | POA: Diagnosis not present

## 2023-10-30 DIAGNOSIS — N390 Urinary tract infection, site not specified: Secondary | ICD-10-CM | POA: Diagnosis not present

## 2023-10-30 DIAGNOSIS — Z87891 Personal history of nicotine dependence: Secondary | ICD-10-CM | POA: Diagnosis not present

## 2023-10-30 DIAGNOSIS — N3001 Acute cystitis with hematuria: Secondary | ICD-10-CM | POA: Diagnosis not present

## 2023-10-30 DIAGNOSIS — R748 Abnormal levels of other serum enzymes: Secondary | ICD-10-CM | POA: Diagnosis not present

## 2023-11-02 NOTE — H&P (Signed)
 TOTAL HIP ADMISSION H&P  Patient is admitted for left total hip arthroplasty.  Subjective:  Chief Complaint: left hip pain  HPI: Robin Christensen, 73 y.o. female, has a history of pain and functional disability in the left hip(s) due to arthritis and patient has failed non-surgical conservative treatments for greater than 12 weeks to include NSAID's and/or analgesics, corticosteriod injections, flexibility and strengthening excercises, supervised PT with diminished ADL's post treatment, use of assistive devices, weight reduction as appropriate, and activity modification.  Onset of symptoms was gradual starting 5 years ago with gradually worsening course since that time.The patient noted no past surgery on the left hip(s).  Patient currently rates pain in the left hip at 10 out of 10 with activity. Patient has night pain, worsening of pain with activity and weight bearing, trendelenberg gait, pain that interfers with activities of daily living, crepitus, and joint swelling. Patient has evidence of subchondral cysts, subchondral sclerosis, periarticular osteophytes, and joint space narrowing by imaging studies. This condition presents safety issues increasing the risk of falls. There is no current active infection.  Patient Active Problem List   Diagnosis Date Noted   Other fatigue increased 06/01/2020   Atrial fibrillation (HCC) 04/23/2020   Vitamin D deficiency 04/08/2020   Hyperglycemia 03/12/2020   Essential hypertension 03/12/2020   Mixed hyperlipidemia 01/14/2020   Lower extremity edema 01/14/2020   Depression 01/14/2020   Class 1 obesity with serious comorbidity and body mass index (BMI) of 34.0 to 34.9 in adult 01/14/2020   Primary osteoarthritis of left knee 08/14/2018   Primary localized osteoarthritis of right knee 05/03/2016   Primary osteoarthritis of right knee 05/03/2016   Abnormal auditory perception of both ears 12/01/2015   Past Medical History:  Diagnosis Date   A-fib  (HCC)    Arthritis    B12 deficiency    Back pain    Breast cancer (HCC)    Cancer (HCC)    Right Breast Cancer   Chronic headaches    Dysrhythmia    AF   Edema, lower extremity    Gallbladder disease    GERD (gastroesophageal reflux disease)    Hyperlipidemia    Hypertension    Hypothyroidism    Joint pain    Obesity    Pneumonia    PONV (postoperative nausea and vomiting)    Sleep apnea    cpcap   Vitamin deficiency     Past Surgical History:  Procedure Laterality Date   BREAST SURGERY     CARDIOVERSION N/A 06/26/2014   Procedure: CARDIOVERSION;  Surgeon: Jessica Morn, MD;  Location: [facility] ENDOSCOPY;  Service: Cardiovascular;  Laterality: N/A;   CARDIOVERSION  09/07/2023   done at [facility]   CATARACT EXTRACTION Bilateral    CHOLECYSTECTOMY     DILATION AND CURETTAGE OF UTERUS     eyelid surgery     TOTAL KNEE ARTHROPLASTY Right 05/03/2016   Procedure: TOTAL KNEE ARTHROPLASTY;  Surgeon: Dayne Even, MD;  Location: [facility] OR;  Service: Orthopedics;  Laterality: Right;   TOTAL KNEE ARTHROPLASTY Left 08/14/2018   Procedure: TOTAL KNEE ARTHROPLASTY;  Surgeon: Dayne Even, MD;  Location: [facility] OR;  Service: Orthopedics;  Laterality: Left;    No current facility-administered medications for this encounter.   Current Outpatient Medications  Medication Sig Dispense Refill Last Dose/Taking   acetaminophen (TYLENOL) 650 MG CR tablet Take 1,300 mg by mouth every 8 (eight) hours.   Taking   apixaban (ELIQUIS) 5 MG TABS tablet Take 1 tablet (5  mg total) by mouth 2 (two) times daily. 180 tablet 0 Taking   Biotin 5000 MCG CAPS Take 5,000 mcg by mouth daily.   Taking   Calcium Carb-Cholecalciferol (CALCIUM 600 + D PO) Take 2 tablets by mouth daily.   Taking   cholecalciferol (VITAMIN D3) 25 MCG (1000 UNIT) tablet Take 1,000 Units by mouth daily.   Taking   furosemide (LASIX) 40 MG tablet Take 1 tablet (40 mg total) by mouth daily. 90 tablet 3 Taking   gabapentin  (NEURONTIN) 300 MG capsule Take 300-600 mg by mouth See admin instructions. 300 mg  in the morning, 300 mg  in afternoon, and 600 mg at bedtime   Taking   levothyroxine (SYNTHROID, LEVOTHROID) 75 MCG tablet Take 75 mcg by mouth daily before breakfast.   Taking   Magnesium 250 MG TABS Take 500 mg by mouth daily.   Taking   oxybutynin (DITROPAN-XL) 10 MG 24 hr tablet Take 10 mg by mouth daily.   Taking   rosuvastatin (CRESTOR) 10 MG tablet Take 1 tablet (10 mg total) by mouth daily. 90 tablet 3 Taking   sertraline (ZOLOFT) 25 MG tablet Take 1 tablet (25 mg total) by mouth daily. (Patient taking differently: Take 25 mg by mouth daily as needed (Headaches).) 30 tablet 5 Taking Differently   vitamin B-12 (CYANOCOBALAMIN) 1000 MCG tablet Take 1,000 mcg by mouth daily.   Taking   HYDROcodone-acetaminophen (NORCO/VICODIN) 5-325 MG tablet Take by mouth. (Patient not taking: Reported on 10/26/2023)   Not Taking   tiZANidine (ZANAFLEX) 4 MG tablet Take by mouth. (Patient not taking: Reported on 10/26/2023)   Not Taking   zonisamide (ZONEGRAN) 25 MG capsule Take 3 capsules (75 mg total) by mouth daily. (Patient not taking: Reported on 10/16/2023) 90 capsule 5 Not Taking   Allergies  Allergen Reactions   Rivaroxaban Other (See Comments)    JOINT ACHES/PAIN     Social History   Tobacco Use   Smoking status: Never   Smokeless tobacco: Never  Substance Use Topics   Alcohol use: Yes    Comment: once a year, maybe    Family History  Problem Relation Age of Onset   High blood pressure Mother    Heart disease Mother    Cancer Mother    Depression Mother    Obesity Mother    Heart disease Father    Kidney disease Father    Cancer Father      Review of Systems  Musculoskeletal:  Positive for arthralgias.       Left Hip  All other systems reviewed and are negative.   Objective:  Physical Exam Constitutional:      Appearance: Normal appearance.  HENT:     Head: Normocephalic and atraumatic.      Nose: Nose normal.     Mouth/Throat:     Pharynx: Oropharynx is clear.  Eyes:     Extraocular Movements: Extraocular movements intact.  Pulmonary:     Effort: Pulmonary effort is normal.  Abdominal:     Palpations: Abdomen is soft.  Musculoskeletal:     Cervical back: Normal range of motion.     Comments: Left hip is extremely painful to internal rotation.  She also has some stiffness.  Straight leg raise is negative.  Leg lengths look equal.  Sensation and motor function are intact distally with palpable pulses in her feet.    Skin:    General: Skin is warm and dry.  Neurological:  General: No focal deficit present.     Mental Status: She is alert and oriented to person, place, and time.  Psychiatric:        Mood and Affect: Mood normal.        Behavior: Behavior normal.        Thought Content: Thought content normal.        Judgment: Judgment normal.     Vital signs in last 24 hours:    Labs:   Estimated body mass index is 37.86 kg/m as calculated from the following:   Height as of 10/26/23: 5\' 8"  (1.727 m).   Weight as of 10/26/23: 112.9 kg.   Imaging Review Plain radiographs demonstrate severe degenerative joint disease of the left hip(s). The bone quality appears to be good for age and reported activity level.   Assessment/Plan:  End stage primary arthritis, left hip(s)  The patient history, physical examination, clinical judgement of the provider and imaging studies are consistent with end stage degenerative joint disease of the left hip(s) and total hip arthroplasty is deemed medically necessary. The treatment options including medical management, injection therapy, arthroscopy and arthroplasty were discussed at length. The risks and benefits of total hip arthroplasty were presented and reviewed. The risks due to aseptic loosening, infection, stiffness, dislocation/subluxation,  thromboembolic complications and other imponderables were discussed.  The patient  acknowledged the explanation, agreed to proceed with the plan and consent was signed. Patient is being admitted for inpatient treatment for surgery, pain control, PT, OT, prophylactic antibiotics, VTE prophylaxis, progressive ambulation and ADL's and discharge planning.The patient is planning to be discharged home with home health services

## 2023-11-07 ENCOUNTER — Encounter (HOSPITAL_COMMUNITY): Admission: RE | Payer: Self-pay | Source: Home / Self Care

## 2023-11-07 ENCOUNTER — Ambulatory Visit (HOSPITAL_COMMUNITY)
Admission: RE | Admit: 2023-11-07 | Payer: Medicare (Managed Care) | Source: Home / Self Care | Admitting: Orthopaedic Surgery

## 2023-11-07 ENCOUNTER — Encounter (HOSPITAL_COMMUNITY): Payer: Self-pay | Admitting: Physician Assistant

## 2023-11-07 SURGERY — ARTHROPLASTY, HIP, TOTAL, ANTERIOR APPROACH
Anesthesia: Spinal | Site: Hip | Laterality: Left

## 2023-11-08 DIAGNOSIS — R300 Dysuria: Secondary | ICD-10-CM | POA: Diagnosis not present

## 2023-11-08 DIAGNOSIS — Z01818 Encounter for other preprocedural examination: Secondary | ICD-10-CM | POA: Diagnosis not present

## 2023-11-08 DIAGNOSIS — R945 Abnormal results of liver function studies: Secondary | ICD-10-CM | POA: Diagnosis not present

## 2023-11-09 DIAGNOSIS — R300 Dysuria: Secondary | ICD-10-CM | POA: Diagnosis not present

## 2023-11-30 DIAGNOSIS — R300 Dysuria: Secondary | ICD-10-CM | POA: Diagnosis not present

## 2023-11-30 DIAGNOSIS — R7303 Prediabetes: Secondary | ICD-10-CM | POA: Diagnosis not present

## 2023-11-30 DIAGNOSIS — R748 Abnormal levels of other serum enzymes: Secondary | ICD-10-CM | POA: Diagnosis not present

## 2023-11-30 DIAGNOSIS — Z01818 Encounter for other preprocedural examination: Secondary | ICD-10-CM | POA: Diagnosis not present

## 2023-11-30 DIAGNOSIS — R945 Abnormal results of liver function studies: Secondary | ICD-10-CM | POA: Diagnosis not present

## 2023-12-14 DIAGNOSIS — Z6836 Body mass index (BMI) 36.0-36.9, adult: Secondary | ICD-10-CM | POA: Diagnosis not present

## 2023-12-14 DIAGNOSIS — E785 Hyperlipidemia, unspecified: Secondary | ICD-10-CM | POA: Diagnosis not present

## 2023-12-14 DIAGNOSIS — E66812 Obesity, class 2: Secondary | ICD-10-CM | POA: Diagnosis not present

## 2023-12-14 DIAGNOSIS — R632 Polyphagia: Secondary | ICD-10-CM | POA: Diagnosis not present

## 2023-12-14 DIAGNOSIS — R7303 Prediabetes: Secondary | ICD-10-CM | POA: Diagnosis not present

## 2023-12-21 DIAGNOSIS — R300 Dysuria: Secondary | ICD-10-CM | POA: Diagnosis not present

## 2023-12-21 DIAGNOSIS — M25559 Pain in unspecified hip: Secondary | ICD-10-CM | POA: Diagnosis not present

## 2023-12-21 DIAGNOSIS — Z01818 Encounter for other preprocedural examination: Secondary | ICD-10-CM | POA: Diagnosis not present

## 2023-12-26 DIAGNOSIS — I10 Essential (primary) hypertension: Secondary | ICD-10-CM | POA: Diagnosis not present

## 2023-12-26 DIAGNOSIS — I4891 Unspecified atrial fibrillation: Secondary | ICD-10-CM | POA: Diagnosis not present

## 2024-01-10 DIAGNOSIS — M1612 Unilateral primary osteoarthritis, left hip: Secondary | ICD-10-CM | POA: Diagnosis not present

## 2024-01-15 DIAGNOSIS — E039 Hypothyroidism, unspecified: Secondary | ICD-10-CM | POA: Diagnosis not present

## 2024-01-15 DIAGNOSIS — E785 Hyperlipidemia, unspecified: Secondary | ICD-10-CM | POA: Diagnosis not present

## 2024-01-15 DIAGNOSIS — I4891 Unspecified atrial fibrillation: Secondary | ICD-10-CM | POA: Diagnosis not present

## 2024-01-15 DIAGNOSIS — I10 Essential (primary) hypertension: Secondary | ICD-10-CM | POA: Diagnosis not present

## 2024-01-23 DIAGNOSIS — E785 Hyperlipidemia, unspecified: Secondary | ICD-10-CM | POA: Diagnosis not present

## 2024-01-23 DIAGNOSIS — Z6836 Body mass index (BMI) 36.0-36.9, adult: Secondary | ICD-10-CM | POA: Diagnosis not present

## 2024-01-23 DIAGNOSIS — R7303 Prediabetes: Secondary | ICD-10-CM | POA: Diagnosis not present

## 2024-01-23 DIAGNOSIS — R632 Polyphagia: Secondary | ICD-10-CM | POA: Diagnosis not present

## 2024-01-23 DIAGNOSIS — E66812 Obesity, class 2: Secondary | ICD-10-CM | POA: Diagnosis not present

## 2024-01-24 DIAGNOSIS — I4891 Unspecified atrial fibrillation: Secondary | ICD-10-CM | POA: Diagnosis not present

## 2024-01-24 DIAGNOSIS — I10 Essential (primary) hypertension: Secondary | ICD-10-CM | POA: Diagnosis not present

## 2024-02-05 ENCOUNTER — Telehealth: Payer: Self-pay | Admitting: Cardiology

## 2024-02-05 NOTE — Telephone Encounter (Signed)
   Pre-operative Risk Assessment    Patient Name: Robin Christensen  DOB: Oct 08, 1950 MRN: [id number]      Request for Surgical Clearance    Procedure:  left total hip replacement  Date of Surgery:  Clearance TBD                                 Surgeon:  Dr Sheril Surgeon's Group or Practice Name:  [facility] Phone number:  [phone]  Fax number:  [phone]   Type of Clearance Requested:   - Medical  - Pharmacy:  Hold    unclear on med   Type of Anesthesia:  Spinal   Additional requests/questions:  None  Signed, April L Harrington   02/05/2024, 11:05 AM

## 2024-02-06 DIAGNOSIS — Z96659 Presence of unspecified artificial knee joint: Secondary | ICD-10-CM | POA: Insufficient documentation

## 2024-02-06 DIAGNOSIS — D6869 Other thrombophilia: Secondary | ICD-10-CM | POA: Insufficient documentation

## 2024-02-06 DIAGNOSIS — E039 Hypothyroidism, unspecified: Secondary | ICD-10-CM | POA: Insufficient documentation

## 2024-02-06 DIAGNOSIS — Z7901 Long term (current) use of anticoagulants: Secondary | ICD-10-CM | POA: Insufficient documentation

## 2024-02-06 DIAGNOSIS — E538 Deficiency of other specified B group vitamins: Secondary | ICD-10-CM | POA: Insufficient documentation

## 2024-02-06 DIAGNOSIS — I700 Atherosclerosis of aorta: Secondary | ICD-10-CM | POA: Insufficient documentation

## 2024-02-06 DIAGNOSIS — G4734 Idiopathic sleep related nonobstructive alveolar hypoventilation: Secondary | ICD-10-CM | POA: Insufficient documentation

## 2024-02-06 DIAGNOSIS — G4733 Obstructive sleep apnea (adult) (pediatric): Secondary | ICD-10-CM | POA: Insufficient documentation

## 2024-02-06 NOTE — Telephone Encounter (Signed)
 1st attempt to reach pt regarding surgical clearance and the need for an TELE appointment.  Left pt a detailed message to call back and get that scheduled.

## 2024-02-06 NOTE — Telephone Encounter (Signed)
 Patient with diagnosis of A Fib on Eliquis  for anticoagulation.    Procedure:  left total hip replacement  Date of procedure: TBD   CHA2DS2-VASc Score = 4  This indicates a 4.8% annual risk of stroke. The patient's score is based upon: CHF History: 0 HTN History: 1 Diabetes History: 0 Stroke History: 0 Vascular Disease History: 1 Age Score: 1 Gender Score: 1     CrCl 91 ml/min Platelet count 190K   Per office protocol, patient can hold Eliquis  for 3 days prior to procedure.    **This guidance is not considered finalized until pre-operative APP has relayed final recommendations.**

## 2024-02-06 NOTE — Telephone Encounter (Signed)
 Primary Cardiologist:None   Preoperative team, please contact this patient and set up a phone call appointment for further preoperative risk assessment. Please obtain consent and complete medication review. Thank you for your help.   I confirm that guidance regarding antiplatelet and oral anticoagulation therapy has been completed and, if necessary, noted below.  Per office protocol, patient can hold Eliquis  for 3 days prior to procedure.   I also confirmed the patient resides in the state of [place] . As per [facility] Medical Board telemedicine laws, the patient must reside in the state in which the provider is licensed.   Robin EMERSON Bane, NP-C  02/06/2024, 4:16 PM [address], [address] [place], [place] [address] Office [phone] Fax [phone]

## 2024-02-07 ENCOUNTER — Telehealth: Payer: Self-pay

## 2024-02-07 NOTE — Telephone Encounter (Signed)
 Patient called back and televisit has been scheduled consent is done     Patient Consent for Virtual Visit         Robin Christensen has provided verbal consent on 02/07/2024 for a virtual visit (video or telephone).   CONSENT FOR VIRTUAL VISIT FOR:  Robin Christensen  By participating in this virtual visit I agree to the following:  I hereby voluntarily request, consent and authorize [facility] and its employed or contracted physicians, physician assistants, nurse practitioners or other licensed health care professionals (the Practitioner), to provide me with telemedicine health care services (the "Services) as deemed necessary by the treating Practitioner. I acknowledge and consent to receive the Services by the Practitioner via telemedicine. I understand that the telemedicine visit will involve communicating with the Practitioner through live audiovisual communication technology and the disclosure of certain medical information by electronic transmission. I acknowledge that I have been given the opportunity to request an in-person assessment or other available alternative prior to the telemedicine visit and am voluntarily participating in the telemedicine visit.  I understand that I have the right to withhold or withdraw my consent to the use of telemedicine in the course of my care at any time, without affecting my right to future care or treatment, and that the Practitioner or I may terminate the telemedicine visit at any time. I understand that I have the right to inspect all information obtained and/or recorded in the course of the telemedicine visit and may receive copies of available information for a reasonable fee.  I understand that some of the potential risks of receiving the Services via telemedicine include:  Delay or interruption in medical evaluation due to technological equipment failure or disruption; Information transmitted may not be sufficient (e.g. poor  resolution of images) to allow for appropriate medical decision making by the Practitioner; and/or  In rare instances, security protocols could fail, causing a breach of personal health information.  Furthermore, I acknowledge that it is my responsibility to provide information about my medical history, conditions and care that is complete and accurate to the best of my ability. I acknowledge that Practitioner's advice, recommendations, and/or decision may be based on factors not within their control, such as incomplete or inaccurate data provided by me or distortions of diagnostic images or specimens that may result from electronic transmissions. I understand that the practice of medicine is not an exact science and that Practitioner makes no warranties or guarantees regarding treatment outcomes. I acknowledge that a copy of this consent can be made available to me via my patient portal [facility] MyChart), or I can request a printed copy by calling the office of [facility].    I understand that my insurance will be billed for this visit.   I have read or had this consent read to me. I understand the contents of this consent, which adequately explains the benefits and risks of the Services being provided via telemedicine.  I have been provided ample opportunity to ask questions regarding this consent and the Services and have had my questions answered to my satisfaction. I give my informed consent for the services to be provided through the use of telemedicine in my medical care

## 2024-02-07 NOTE — Telephone Encounter (Signed)
 Tried contacting patient back to schedule TELEVISIT no answer left a detailed vm to call back and ask to speak to preop team

## 2024-02-07 NOTE — Telephone Encounter (Signed)
 Pt returning call to schedule tele visit.

## 2024-02-07 NOTE — Telephone Encounter (Signed)
 Patient called back and has been scheduled for televisit

## 2024-02-14 ENCOUNTER — Encounter: Payer: Self-pay | Admitting: Neurology

## 2024-02-14 ENCOUNTER — Ambulatory Visit: Payer: Medicare (Managed Care) | Attending: Cardiology | Admitting: Student

## 2024-02-14 ENCOUNTER — Ambulatory Visit: Payer: Medicare (Managed Care) | Admitting: Neurology

## 2024-02-14 VITALS — BP 140/82 | Resp 20 | Ht 68.0 in | Wt 247.0 lb

## 2024-02-14 DIAGNOSIS — G44219 Episodic tension-type headache, not intractable: Secondary | ICD-10-CM

## 2024-02-14 DIAGNOSIS — R519 Headache, unspecified: Secondary | ICD-10-CM

## 2024-02-14 DIAGNOSIS — Z01810 Encounter for preprocedural cardiovascular examination: Secondary | ICD-10-CM

## 2024-02-14 MED ORDER — SERTRALINE HCL 50 MG PO TABS: 50.0000 mg | ORAL_TABLET | Freq: Every day | ORAL | 5 refills | Status: AC

## 2024-02-14 MED ORDER — BACLOFEN 10 MG PO TABS: ORAL_TABLET | ORAL | 5 refills | Status: AC

## 2024-02-14 NOTE — Progress Notes (Signed)
 Virtual Visit via Telephone Note   Because of Robin Christensen's co-morbid illnesses, she is at least at moderate risk for complications without adequate follow up.  This format is felt to be most appropriate for this patient at this time.  The patient did not have access to video technology/had technical difficulties with video requiring transitioning to audio format only (telephone).  All issues noted in this document were discussed and addressed.  No physical exam could be performed with this format.  Please refer to the patient's chart for her consent to telehealth for [facility].  Evaluation Performed:  Preoperative cardiovascular risk assessment _____________   Date:  02/14/2024   Patient ID:  Robin Christensen, DOB 04/03/1951, MRN [id number] Patient Location:  Home Provider location:   Office  Primary Care Provider:  Kip Righter, MD Primary Cardiologist:  Robin Bihari, MD  Chief Complaint / Patient Profile   73 y.o. y/o female with a h/o PAF s/p cardioversion 2015 on anticoagulation, hypertension, hyperlipidemia, OSA, hypothyroidism who is pending left total hip replacement by Dr. Dalldorf and presents today for telephonic preoperative cardiovascular risk assessment.  History of Present Illness    Robin Christensen is a 73 y.o. female who presents via audio/video conferencing for a telehealth visit today.  Pt was last seen in cardiology clinic on 10/03/2023 by Robin Fabry, PA-C.  At that time Robin Christensen was stable from a cardiac standpoint.  The patient is now pending procedure as outlined above. Since her last visit, she is doing well. Patient denies shortness of breath, dyspnea on exertion, lower extremity edema, orthopnea or PND. No chest pain, pressure, or tightness. No palpitations. She is very active swimming for 1 hour 3 days a week at the [facility].   Past Medical History    Past Medical History:  Diagnosis Date   A-fib (HCC)    Arthritis    B12  deficiency    Back pain    Breast cancer (HCC)    Cancer (HCC)    Right Breast Cancer   Chronic headaches    Dysrhythmia    AF   Edema, lower extremity    Gallbladder disease    GERD (gastroesophageal reflux disease)    Hyperlipidemia    Hypertension    Hypothyroidism    Joint pain    Obesity    Pneumonia    PONV (postoperative nausea and vomiting)    Sleep apnea    cpcap   Vitamin deficiency    Past Surgical History:  Procedure Laterality Date   BREAST SURGERY     CARDIOVERSION N/A 06/26/2014   Procedure: CARDIOVERSION;  Surgeon: Erick Robin Bergamo, MD;  Location: [facility] END[facility];  Service: Cardiovascular;  Laterality: N/A;   CARDIOVERSION  09/07/2023   done at [facility]   CATARACT EXTRACTION Bilateral    CHOLECYSTECTOMY     DILATION AND CURETTAGE OF UTERUS     eyelid surgery     TOTAL KNEE ARTHROPLASTY Right 05/03/2016   Procedure: TOTAL KNEE ARTHROPLASTY;  Surgeon: Maude Herald, MD;  Location: [facility] OR;  Service: Orthopedics;  Laterality: Right;   TOTAL KNEE ARTHROPLASTY Left 08/14/2018   Procedure: TOTAL KNEE ARTHROPLASTY;  Surgeon: Herald Maude, MD;  Location: [facility] OR;  Service: Orthopedics;  Laterality: Left;    Allergies  Allergies  Allergen Reactions   Rivaroxaban  Other (See Comments)    JOINT ACHES/PAIN     Home Medications    Prior to Admission medications   Medication Sig Start Date  End Date Taking? Authorizing Provider  acetaminophen  (TYLENOL ) 650 MG CR tablet Take 1,300 mg by mouth every 8 (eight) hours.    [provider]  apixaban  (ELIQUIS ) 5 MG TABS tablet Take 1 tablet (5 mg total) by mouth 2 (two) times daily. 10/03/23   Robin Robin SAILOR, PA-C  Biotin 5000 MCG CAPS Take 5,000 mcg by mouth daily.    [provider]  Calcium  Carb-Cholecalciferol (CALCIUM  600 + D PO) Take 2 tablets by mouth daily.    [provider]  cholecalciferol (VITAMIN D3) 25 MCG (1000 UNIT) tablet Take 1,000 Units by mouth daily.    [provider]  furosemide  (LASIX ) 40 MG tablet Take 1 tablet (40 mg total) by mouth daily. 10/03/23   Robin Robin SAILOR, PA-C  gabapentin  (NEURONTIN ) 300 MG capsule Take 300-600 mg by mouth See admin instructions. 300 mg  in the morning, 300 mg  in afternoon, and 600 mg at bedtime    [provider]  HYDROcodone -acetaminophen  (NORCO/VICODIN) 5-325 MG tablet Take by mouth. Patient not taking: Reported on 10/26/2023 09/05/23   [provider]  levothyroxine  (SYNTHROID , LEVOTHROID) 75 MCG tablet Take 75 mcg by mouth daily before breakfast.    [provider]  Magnesium 250 MG TABS Take 500 mg by mouth daily.    [provider]  oxybutynin (DITROPAN-XL) 10 MG 24 hr tablet Take 10 mg by mouth daily. 07/14/23   [provider]  rosuvastatin  (CRESTOR ) 10 MG tablet Take 1 tablet (10 mg total) by mouth daily. 10/03/23   Robin Robin SAILOR, PA-C  sertraline  (ZOLOFT ) 25 MG tablet Take 1 tablet (25 mg total) by mouth daily. Patient taking differently: Take 25 mg by mouth daily as needed (Headaches). 10/16/23   Robin Juliene SAUNDERS, DO  tiZANidine  (ZANAFLEX ) 4 MG tablet Take by mouth. Patient not taking: Reported on 10/26/2023 09/05/23   [provider]  vitamin B-12 (CYANOCOBALAMIN ) 1000 MCG tablet Take 1,000 mcg by mouth daily.    [provider]  zonisamide  (ZONEGRAN ) 25 MG capsule Take 3 capsules (75 mg total) by mouth daily. Patient not taking: Reported on 10/16/2023 04/17/23   Robin Juliene SAUNDERS, DO    Physical Exam    Vital Signs:  Robin Christensen does not have vital signs available for review today.  Given telephonic nature of communication, physical exam is limited. AAOx3. NAD. Normal affect.  Speech and respirations are unlabored.   Assessment & Plan    Primary Cardiologist: Robin Bihari, MD  Preoperative cardiovascular risk assessment.  Left total hip replacement by Dr. Sheril  Chart reviewed as part of pre-operative protocol coverage. According  to the RCRI, patient has a 0.4% risk of MACE. Patient reports activity equivalent to >4.0 METS (swims for an hour 3 days a week).   Given past medical history and time since last visit, based on ACC/AHA guidelines, Robin Christensen would be at acceptable risk for the planned procedure without further cardiovascular testing.   Patient was advised that if she develops new symptoms prior to surgery to contact our office to arrange a follow-up appointment.  she verbalized understanding.  Per Pharm D, patient  may hold Eliquis  for 3 days prior to procedure.    I will route this recommendation to the requesting party via Epic fax function.  Please call with questions.  Time:   Today, I have spent 5 minutes with the patient with telehealth technology discussing medical history, symptoms, and management plan.     Barnie  Lewi Drost, NP  02/14/2024, 7:38 AM

## 2024-02-14 NOTE — Patient Instructions (Addendum)
 MRI and MRA of brain. We have sent a referral to [facility] for your MRI and they will call you directly to schedule your appointment. They are located at [address] [facility]. If you need to contact them directly please call [phone].  Increase sertraline  to 50mg  daily.  If no improvement in 8 weeks, contact me. Take Extra-strength Tylenol  with/without bacofen 1/2 tab to 1 tab as needed for headache attack Follow up next available.

## 2024-02-14 NOTE — Progress Notes (Signed)
 NEUROLOGY FOLLOW UP OFFICE NOTE  Robin Christensen [id number]  Assessment/Plan:   Episodic tension-type headache, not intractable, worse since off zonisamide . New onset headache with visual disturbance - as it was a severe new headache with new symptoms, further evaluation is warranted.     Check MRI and MRA of head Headache prevention:  increase sertraline  to 50mg  daily.  We can increase dose in 8 weeks if needed. Headache rescue:  Extra-strength Tylenol  with coffee and baclofen  5-10mg  as needed. Limit use of pain relievers to no more than 9 days out of the month to prevent risk of rebound or medication-overuse headache. Keep headache diary Follow up in 8 months.   Subjective:  Robin Christensen is a 72 year old right-handed female with hypothyroidism, hyperlipidemia, hypertension, OSA, atrial fibrillation status post cardioversion and history of breast cancer status post mastectomy who follows up for headache.   UPDATE: Increased zonisamide  to 75mg  daily.  She did very well.  However, her insurance denied further coverage, even with an appeal submitting letter of medical necessity.  Recommended starting tizanidine , however she does not feel comfortable about starting a muscle relaxant that can cause drowsiness.  Started sertraline  25mg  daily.  Has been getting more headaches.  Now occurring 1 to 2 times a week.  Wakes up with the headaches but she is using her CPAP.  Lasts 1 to 2 hours with Extra-strength Tylenol .    A week ago, she woke up from a nap with a new severe headache with blurred vision.  The blurred vision lasted 15 minutes.  The headache was non-throbbing across the forehead but more prominent on the right sided.  She had some mild nausea but no vomiting, photophobia, phonophobia, slurred speech, facial droop, vertigo or unilateral numbness or weakness.  The severe headache lasted 2 days and still had a residual headache for another day before it resolved.  During that  time, she reports that her legs felt a little weak off and on, causing her to feel unsteady on her feet.  She has low back pain and hip pain but no associated radicular pain or numbness in the legs.  No involvement of the upper extremities.    More headaches on sertraline  - more headaches  may go several weeks without - but lately 1-2 times a week.  Wake up with it - takes extra-strength Tylenol  - aborts within an hour or two - has CPAP -   Woke up other day from a nap and blurred vision for 15 minutes and headache lasted 3 days across front severe, pressure, more prominent right frontal - mild nausea, that's it.  Legs felt a little weak off and on during those 3 days.  No back or leg pain or numbness - No issues in the arms  Last week    Intensity:  They are now severe (feels like head is going to explode) Duration:  May last for hours Frequency:   2 days a week but may skip a week. Over the last year, has had about 3 episodes of vertigo. Current NSAIDS: None Current analgesics: Tylenol  Arthritis (takes 3 times a day for her arthritis) Current triptans: None Current ergotamine: None Current anti-emetic: None Current muscle relaxants: None Current anti-anxiolytic: None Current sleep aide: None Current Antihypertensive medications: Lasix  Current Antidepressant medications: sertraline  25mg  daily Current Anticonvulsant medications: gabapentin  300mg /300mg /600mg  (for back pain) Current anti-CGRP: None Current Vitamins/Herbal/Supplements: KCl, B12 Current Antihistamines/Decongestants: None Other therapy: None Other medications: levothyroxine , Eliquis    Caffeine: No Alcohol: No Smoker:  No Diet: Hydrates Exercise: Walks 3 miles 5 days a week Depression: No; Anxiety: No Other pain: Lumbar spinal stenosis.   Sleep hygiene: Sleeps 4 to 5 hours straight, wakes up for 2 hours and then sleeps for another 2 hours.  She has OSA which is successfully treated with CPAP.    HISTORY: Onset:   2018.  At the time, they were severe and daily.  It was found that her OSA was uncontrolled and CPAP was adjusted.  Headaches resolved but returned about 2 months ago, albeit less severe.  CPAP was rechecked and is adequate. Location:  Bifrontal/across top of head Quality:  pressure Initial intensity:  mild.  She denies new headache, thunderclap headache or severe headache that wakes her from sleep. Aura:  no Prodrome:  no Postdrome:  no Associated symptoms:  None. Initially some nausea a year ago.  Now without nausea, vomiting, photophobia, phonophobia, autonomic symptoms or visual disturbance.  She denies associated unilateral numbness or weakness. Initial Duration:  1 hour to all day Initial Frequency:  3 days a week Initial Frequency of abortive medication: Extra-strength Tylenol  3 days a week Triggers:  Emotional stress Relieving factors:  When occupied by other activities Activity:  Does not aggravate She endorses aching pain in her jaw, right worse than left.  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  After several years, she followed up with a dentist who didn't believe that she had TMJ dysfunction.  She has seen ophthalmology and had cataract surgery.  Vision is improved.     MRI of brain without and with contrast from 01/15/18 was personally reviewed and was unremarkable except for incidental partial empty sella.   Past NSAIDS: ibuprofen, naproxen Past analgesics:  no Past abortive triptans:  no Past muscle relaxants:  no Past anti-emetic:  no Past antihypertensive medications:  Toprol  XL Past antidepressant medications:  Nortriptyline  10mg  (hunger/weight gain, sluggishness), Cymbalta  60mg  Past anticonvulsant medications:   Topiramate  25 mg at bedtime (discontinued due to cognitive deficits), zonisamide  75mg  daily (effective but not covered by insurance) Past vitamins/Herbal/Supplements:  no Past antihistamines/decongestants:  no Other past therapies:  no    She has history of headaches off and on throughout her life.  Family history of headache:  No  PAST MEDICAL HISTORY: Past Medical History:  Diagnosis Date   A-fib (HCC)    Arthritis    B12 deficiency    Back pain    Breast cancer (HCC)    Cancer (HCC)    Right Breast Cancer   Chronic headaches    Dysrhythmia    AF   Edema, lower extremity    Gallbladder disease    GERD (gastroesophageal reflux disease)    Hyperlipidemia    Hypertension    Hypothyroidism    Joint pain    Obesity    Pneumonia    PONV (postoperative nausea and vomiting)    Sleep apnea    cpcap   Vitamin deficiency     MEDICATIONS: Current Outpatient Medications on File Prior to Visit  Medication Sig Dispense Refill   acetaminophen  (TYLENOL ) 650 MG CR tablet Take 1,300 mg by mouth every 8 (eight) hours.     apixaban  (ELIQUIS ) 5 MG TABS tablet Take 1 tablet (5 mg total) by mouth 2 (two) times daily. 180 tablet 0   Biotin 5000 MCG CAPS Take 5,000 mcg by mouth daily.     Calcium  Carb-Cholecalciferol (CALCIUM  600 + D PO) Take 2 tablets by mouth daily.  cholecalciferol (VITAMIN D3) 25 MCG (1000 UNIT) tablet Take 1,000 Units by mouth daily.     furosemide  (LASIX ) 40 MG tablet Take 1 tablet (40 mg total) by mouth daily. 90 tablet 3   gabapentin  (NEURONTIN ) 300 MG capsule Take 300-600 mg by mouth See admin instructions. 300 mg  in the morning, 300 mg  in afternoon, and 600 mg at bedtime     levothyroxine  (SYNTHROID , LEVOTHROID) 75 MCG tablet Take 75 mcg by mouth daily before breakfast.     Magnesium 250 MG TABS Take 500 mg by mouth daily.     oxybutynin (DITROPAN-XL) 10 MG 24 hr tablet Take 10 mg by mouth daily.     rosuvastatin  (CRESTOR ) 10 MG tablet Take 1 tablet (10 mg total) by mouth daily. 90 tablet 3   vitamin B-12 (CYANOCOBALAMIN ) 1000 MCG tablet Take 1,000 mcg by mouth daily.     zonisamide  (ZONEGRAN ) 25 MG capsule Take 3 capsules (75 mg total) by mouth daily. (Patient not taking: Reported on 02/14/2024)  90 capsule 5   No current facility-administered medications on file prior to visit.     ALLERGIES: Allergies  Allergen Reactions   Rivaroxaban  Other (See Comments)    JOINT ACHES/PAIN     FAMILY HISTORY: Family History  Problem Relation Age of Onset   High blood pressure Mother    Heart disease Mother    Cancer Mother    Depression Mother    Obesity Mother    Heart disease Father    Kidney disease Father    Cancer Father       Objective:  Blood pressure (!) 140/82, resp. rate 20, height 5' 8 (1.727 m), weight 247 lb (112 kg), SpO2 95%. General: No acute distress.  Patient appears well-groomed.   Head:  Normocephalic/atraumatic Neck:  Supple.  No paraspinal tenderness.  Full range of motion. Heart:  Regular rate and rhythm. Neuro:  Alert and oriented.  Speech fluent and not dysarthric.  Language intact.  CN II-XII intact.  Bulk and tone normal.  Muscle strength 5/5 throughout.  Sensation to pinprick and vibration intact.  Deep tendon reflexes 2+ throughout, toes downgoing.  Gait normal.  Romberg negative.    Juliene Dunnings, DO  CC: [employer], MD

## 2024-02-15 DIAGNOSIS — I10 Essential (primary) hypertension: Secondary | ICD-10-CM | POA: Diagnosis not present

## 2024-02-15 DIAGNOSIS — E785 Hyperlipidemia, unspecified: Secondary | ICD-10-CM | POA: Diagnosis not present

## 2024-02-15 DIAGNOSIS — I4891 Unspecified atrial fibrillation: Secondary | ICD-10-CM | POA: Diagnosis not present

## 2024-02-15 DIAGNOSIS — E039 Hypothyroidism, unspecified: Secondary | ICD-10-CM | POA: Diagnosis not present

## 2024-02-16 ENCOUNTER — Encounter: Payer: Self-pay | Admitting: Neurology

## 2024-02-16 DIAGNOSIS — Z01818 Encounter for other preprocedural examination: Secondary | ICD-10-CM | POA: Diagnosis not present

## 2024-02-16 DIAGNOSIS — I4891 Unspecified atrial fibrillation: Secondary | ICD-10-CM | POA: Diagnosis not present

## 2024-02-16 DIAGNOSIS — M25559 Pain in unspecified hip: Secondary | ICD-10-CM | POA: Diagnosis not present

## 2024-02-16 DIAGNOSIS — I10 Essential (primary) hypertension: Secondary | ICD-10-CM | POA: Diagnosis not present

## 2024-02-16 DIAGNOSIS — R300 Dysuria: Secondary | ICD-10-CM | POA: Diagnosis not present

## 2024-02-23 DIAGNOSIS — I4891 Unspecified atrial fibrillation: Secondary | ICD-10-CM | POA: Diagnosis not present

## 2024-02-23 DIAGNOSIS — I10 Essential (primary) hypertension: Secondary | ICD-10-CM | POA: Diagnosis not present

## 2024-02-26 DIAGNOSIS — E785 Hyperlipidemia, unspecified: Secondary | ICD-10-CM | POA: Diagnosis not present

## 2024-02-26 DIAGNOSIS — R632 Polyphagia: Secondary | ICD-10-CM | POA: Diagnosis not present

## 2024-02-26 DIAGNOSIS — E66812 Obesity, class 2: Secondary | ICD-10-CM | POA: Diagnosis not present

## 2024-02-26 DIAGNOSIS — Z6836 Body mass index (BMI) 36.0-36.9, adult: Secondary | ICD-10-CM | POA: Diagnosis not present

## 2024-02-26 DIAGNOSIS — R7303 Prediabetes: Secondary | ICD-10-CM | POA: Diagnosis not present

## 2024-03-01 ENCOUNTER — Ambulatory Visit
Admission: RE | Admit: 2024-03-01 | Discharge: 2024-03-01 | Disposition: A | Discharge status: Home / Self Care | Payer: Medicare (Managed Care) | Source: Ambulatory Visit | Attending: Neurology | Admitting: Neurology

## 2024-03-01 DIAGNOSIS — R519 Headache, unspecified: Secondary | ICD-10-CM

## 2024-03-02 ENCOUNTER — Other Ambulatory Visit: Payer: Self-pay | Admitting: Orthopaedic Surgery

## 2024-03-06 NOTE — Patient Instructions (Signed)
 SURGICAL WAITING ROOM VISITATION Patients having surgery or a procedure may have no more than 2 support people in the waiting area - these visitors may rotate in the visitor waiting room.   If the patient needs to stay at the hospital during part of their recovery, the visitor guidelines for inpatient rooms apply.   PRE-OP VISITATION  Pre-op nurse will coordinate an appropriate time for 1 support person to accompany the patient in pre-op.  This support person may not rotate.  This visitor will be contacted when the time is appropriate for the visitor to come back in the pre-op area.   Please refer to the [facility] website for the visitor guidelines for Inpatients (after your surgery is over and you are in a regular room).   You are not required to quarantine at this time prior to your surgery. However, you must do this: Hand Hygiene often Do NOT share personal items Notify your provider if you are in close contact with someone who has COVID or you develop fever 100.4 or greater, new onset of sneezing, cough, sore throat, shortness of breath or body aches.  If you test positive for Covid or have been in contact with anyone that has tested positive in the last 10 days please notify you surgeon.     Your procedure is scheduled on:  TUESDAY  March 19, 2024   Report to [facility] Main Entrance: [address] entrance where the [employer] is available.    Report to admitting at: 05:15 AM   Call this number if you have any questions or problems the morning of surgery [phone]   Do not eat or drink anything after Midnight the night prior to your surgery/procedure.     FOLLOW ANY ADDITIONAL PRE OP INSTRUCTIONS YOU RECEIVED FROM YOUR SURGEON'S OFFICE!!!    Oral Hygiene is also important to reduce your risk of infection.        Remember - BRUSH YOUR TEETH THE MORNING OF SURGERY WITH YOUR REGULAR TOOTHPASTE   Do NOT smoke after Midnight the night before surgery.   STOP TAKING  all Vitamins, Herbs and supplements 1 week before your surgery.      Eliquis - Stop taking x 3 days before your surgery. Last Dose will be taken on 03-15-2024   DO NOT TAKE FUROSEMIDE  (LASIX ) THE DAY OF SURGERY  03-19-2024   Take ONLY these medicines the morning of surgery with A SIP OF WATER: Oxybutynin, levothyroxine , sertraline , gabapentin , and Tylenol  if needed.      If You have been diagnosed with Sleep Apnea - Bring CPAP mask and tubing day of surgery. We will provide you with a CPAP machine on the day of your surgery.                   You may not have any metal on your body including hair pins, jewelry, and body piercing   Do not wear make-up, lotions, powders, perfumes or deodorant   Do not wear nail polish including gel and S&S, artificial / acrylic nails, or any other type of covering on natural nails including finger and toenails. If you have artificial nails, gel coating, etc., that needs to be removed by a nail salon, Please have this removed prior to surgery. Not doing so may mean that your surgery could be cancelled or delayed if the Surgeon or anesthesia staff feels like they are unable to monitor you safely.    Do not shave 48 hours prior to surgery  to avoid nicks in your skin which may contribute to postoperative infections.    Contacts, Hearing Aids, dentures or bridgework may not be worn into surgery. DENTURES WILL BE REMOVED PRIOR TO SURGERY PLEASE DO NOT APPLY Poly grip OR ADHESIVES!!!   Patients discharged on the day of surgery will not be allowed to drive home.  Someone NEEDS to stay with you for the first 24 hours after anesthesia.   Do not bring your home medications to the hospital. The Pharmacy will dispense medications listed on your medication list to you during your admission in the Hospital.   Special Instructions: Bring a copy of your healthcare power of attorney and living will documents the day of surgery, if you wish to have them scanned into your  [facility] Records- EPIC   Please read over the following fact sheets you were given: IF YOU HAVE QUESTIONS ABOUT YOUR PRE-OP INSTRUCTIONS, PLEASE CALL [phone].        Pre-operative 5 CHG Bath Instructions    You can play a key role in reducing the risk of infection after surgery. Your skin needs to be as free of germs as possible. You can reduce the number of germs on your skin by washing with CHG (chlorhexidine  gluconate) soap before surgery. CHG is an antiseptic soap that kills germs and continues to kill germs even after washing.    DO NOT use if you have an allergy to chlorhexidine /CHG or antibacterial soaps. If your skin becomes reddened or irritated, stop using the CHG and notify one of our RNs at [phone]   Please shower with the CHG soap starting 4 days before surgery using the following schedule: START SHOWERS ON   FRIDAY  March 15, 2024                                                                                                                                                                               Please keep in mind the following:  DO NOT shave, including legs and underarms, starting the day of your first shower.   You may shave your face at any point before/day of surgery.    Place clean sheets on your bed the day you start using CHG soap. Use a clean washcloth (not used since being washed) for each shower. DO NOT sleep with pets once you start using the CHG.    CHG Shower Instructions:  If you choose to wash your hair and private area, wash first with your normal shampoo/soap.  After you use shampoo/soap, rinse your hair and body thoroughly to remove shampoo/soap residue.  Turn the water OFF and apply about 3 tablespoons (45 ml) of CHG soap to a CLEAN washcloth.  Apply CHG soap ONLY FROM YOUR NECK DOWN TO YOUR TOES (washing for 3-5 minutes)  DO NOT use CHG soap on face, private areas, open wounds, or sores.  Pay special attention to the area  where your surgery is being performed.  If you are having back surgery, having someone wash your back for you may be helpful.   Wait 2 minutes after CHG soap is applied, then you may rinse off the CHG soap.  Pat dry with a clean towel  Put on clean clothes/pajamas   If you choose to wear lotion, please use ONLY the CHG-compatible lotions on the back of this paper.     Additional instructions for the day of surgery: DO NOT APPLY any lotions, deodorants, cologne, or perfumes.   Put on clean/comfortable clothes.  Brush your teeth.  Ask your nurse before applying any prescription medications to the skin.          CHG Compatible Lotions    Aveeno Moisturizing lotion  Cetaphil Moisturizing Cream  Cetaphil Moisturizing Lotion  Clairol Herbal Essence Moisturizing Lotion, Dry Skin  Clairol Herbal Essence Moisturizing Lotion, Extra Dry Skin  Clairol Herbal Essence Moisturizing Lotion, Normal Skin  Curel Age Defying Therapeutic Moisturizing Lotion with Alpha Hydroxy  Curel Extreme Care Body Lotion  Curel Soothing Hands Moisturizing Hand Lotion  Curel Therapeutic Moisturizing Cream, Fragrance-Free  Curel Therapeutic Moisturizing Lotion, Fragrance-Free  Curel Therapeutic Moisturizing Lotion, Original Formula  Eucerin Daily Replenishing Lotion  Eucerin Dry Skin Therapy Plus Alpha Hydroxy Crme  Eucerin Dry Skin Therapy Plus Alpha Hydroxy Lotion  Eucerin Original Crme  Eucerin Original Lotion  Eucerin Plus Crme Eucerin Plus Lotion  Eucerin TriLipid Replenishing Lotion  Keri Anti-Bacterial Hand Lotion  Keri Deep Conditioning Original Lotion Dry Skin Formula Softly Scented  Keri Deep Conditioning Original Lotion, Fragrance Free Sensitive Skin Formula  Keri Lotion Fast Absorbing Fragrance Free Sensitive Skin Formula  Keri Lotion Fast Absorbing Softly Scented Dry Skin Formula  Keri Original Lotion  Keri Skin Renewal Lotion Keri Silky Smooth Lotion  Keri Silky Smooth Sensitive Skin  Lotion  Nivea Body Creamy Conditioning Oil  Nivea Body Extra Enriched Lotion  Nivea Body Original Lotion  Nivea Body Sheer Moisturizing Lotion Nivea Crme  Nivea Skin Firming Lotion  NutraDerm 30 Skin Lotion  NutraDerm Skin Lotion  NutraDerm Therapeutic Skin Cream  NutraDerm Therapeutic Skin Lotion  ProShield Protective Hand Cream  Provon moisturizing lotion     FAILURE TO FOLLOW THESE INSTRUCTIONS MAY RESULT IN THE CANCELLATION OF YOUR SURGERY   PATIENT SIGNATURE_________________________________   NURSE SIGNATURE__________________________________   ________________________________________________________________________              Robin Christensen      An incentive spirometer is a tool that can help keep your lungs clear and active. This tool measures how well you are filling your lungs with each breath. Taking long deep breaths may help reverse or decrease the chance of developing breathing (pulmonary) problems (especially infection) following: A long period of time when you are unable to move or be active. BEFORE THE PROCEDURE  If the spirometer includes an indicator to show your best effort, your nurse or respiratory therapist will set it to a desired goal. If possible, sit up straight or lean slightly forward. Try not to slouch. Hold the incentive spirometer in an upright position. INSTRUCTIONS FOR USE  Sit on the edge of your bed if possible, or sit up as far as you can in bed or on a chair. Hold the  incentive spirometer in an upright position. Breathe out normally. Place the mouthpiece in your mouth and seal your lips tightly around it. Breathe in slowly and as deeply as possible, raising the piston or the ball toward the top of the column. Hold your breath for 3-5 seconds or for as long as possible. Allow the piston or ball to fall to the bottom of the column. Remove the mouthpiece from your mouth and breathe out normally. Rest for a few seconds and repeat  Steps 1 through 7 at least 10 times every 1-2 hours when you are awake. Take your time and take a few normal breaths between deep breaths. The spirometer may include an indicator to show your best effort. Use the indicator as a goal to work toward during each repetition. After each set of 10 deep breaths, practice coughing to be sure your lungs are clear. If you have an incision (the cut made at the time of surgery), support your incision when coughing by placing a pillow or rolled up towels firmly against it. Once you are able to get out of bed, walk around indoors and cough well. You may stop using the incentive spirometer when instructed by your caregiver.  RISKS AND COMPLICATIONS Take your time so you do not get dizzy or light-headed. If you are in pain, you may need to take or ask for pain medication before doing incentive spirometry. It is harder to take a deep breath if you are having pain. AFTER USE Rest and breathe slowly and easily. It can be helpful to keep track of a log of your progress. Your caregiver can provide you with a simple table to help with this. If you are using the spirometer at home, follow these instructions: SEEK MEDICAL CARE IF:  You are having difficultly using the spirometer. You have trouble using the spirometer as often as instructed. Your pain medication is not giving enough relief while using the spirometer. You develop fever of 100.5 F (38.1 C) or higher.                                                                                                    SEEK IMMEDIATE MEDICAL CARE IF:  You cough up bloody sputum that had not been present before. You develop fever of 102 F (38.9 C) or greater. You develop worsening pain at or near the incision site. MAKE SURE YOU:  Understand these instructions. Will watch your condition. Will get help right away if you are not doing well or get worse. Document Released: 11/14/2006 Document Revised: 09/26/2011 Document  Reviewed: 01/15/2007 [facility] Patient Information 2014 [place], [place].               WHAT IS A BLOOD TRANSFUSION? Blood Transfusion Information   A transfusion is the replacement of blood or some of its parts. Blood is made up of multiple cells which provide different functions. Red blood cells carry oxygen and are used for blood loss replacement. White blood cells fight against infection. Platelets control bleeding. Plasma helps clot blood. Other blood products are available for specialized needs,  such as hemophilia or other clotting disorders. BEFORE THE TRANSFUSION  Who gives blood for transfusions?  Healthy volunteers who are fully evaluated to make sure their blood is safe. This is blood bank blood. Transfusion therapy is the safest it has ever been in the practice of medicine. Before blood is taken from a donor, a complete history is taken to make sure that person has no history of diseases nor engages in risky social behavior (examples are intravenous drug use or sexual activity with multiple partners). The donor's travel history is screened to minimize risk of transmitting infections, such as malaria. The donated blood is tested for signs of infectious diseases, such as HIV and hepatitis. The blood is then tested to be sure it is compatible with you in order to minimize the chance of a transfusion reaction. If you or a relative donates blood, this is often done in anticipation of surgery and is not appropriate for emergency situations. It takes many days to process the donated blood. RISKS AND COMPLICATIONS Although transfusion therapy is very safe and saves many lives, the main dangers of transfusion include:  Getting an infectious disease. Developing a transfusion reaction. This is an allergic reaction to something in the blood you were given. Every precaution is taken to prevent this. The decision to have a blood transfusion has been considered carefully by your caregiver before  blood is given. Blood is not given unless the benefits outweigh the risks. AFTER THE TRANSFUSION Right after receiving a blood transfusion, you will usually feel much better and more energetic. This is especially true if your red blood cells have gotten low (anemic). The transfusion raises the level of the red blood cells which carry oxygen, and this usually causes an energy increase. The nurse administering the transfusion will monitor you carefully for complications. HOME CARE INSTRUCTIONS  No special instructions are needed after a transfusion. You may find your energy is better. Speak with your caregiver about any limitations on activity for underlying diseases you may have. SEEK MEDICAL CARE IF:  Your condition is not improving after your transfusion. You develop redness or irritation at the intravenous (IV) site. SEEK IMMEDIATE MEDICAL CARE IF:  Any of the following symptoms occur over the next 12 hours: Shaking chills. You have a temperature by mouth above 102 F (38.9 C), not controlled by medicine. Chest, back, or muscle pain. People around you feel you are not acting correctly or are confused. Shortness of breath or difficulty breathing. Dizziness and fainting. You get a rash or develop hives. You have a decrease in urine output. Your urine turns a dark color or changes to pink, red, or brown. Any of the following symptoms occur over the next 10 days: You have a temperature by mouth above 102 F (38.9 C), not controlled by medicine. Shortness of breath. Weakness after normal activity. The white part of the eye turns yellow (jaundice). You have a decrease in the amount of urine or are urinating less often. Your urine turns a dark color or changes to pink, red, or brown. Document Released: 07/01/2000 Document Revised: 09/26/2011 Document Reviewed: 02/18/2008 [facility] Patient Information 2014 [place], [place].    _______________________________________________________________________               If you would like to see a video about joint replacement:    [email or web address]

## 2024-03-06 NOTE — Progress Notes (Signed)
 COVID Vaccine received:  []  No [x]  Yes Date of any COVID positive Test in last 90 days:  None   PCP - [employer], MD  at [facility]  [phone]   Medical clearance 02-16-24 [facility] note Cardiologist - Wilbert Bihari, MD , Barnie Hila, NP cardiac clearance in 02-14-24 note Neurology-  Juliene Dunnings, MD    Chest x-ray - 08-07-2018  lilla Salines, MD ordered repeat at [facility] EKG -  08-15-2023  Epic Stress Test - 05-27-2011  Epic ECHO - 06-02-2011  Epic Cardiac Cath -    Pacemaker / ICD device [x]  No []  Yes   Spinal Cord Stimulator:[x]  No []  Yes       History of Sleep Apnea? []  No [x]  Yes  Study 2019 CPAP used?- []  No [x]  Yes     Does the patient monitor blood sugar?   []  N/A   [x]  No []  Yes  Patient has: []  NO Hx DM   [x]  Pre-DM   []  DM1  []   DM2 Last A1c was: 5.8  on   02-26-24 at PCP   Blood Thinner / Instructions:   Eliquis   hold x 3 days  Last Dose will be taken on 03-15-2024 Aspirin  Instructions   none   ERAS Protocol Ordered: []  No  [x]  Yes PRE-SURGERY []  ENSURE  [x]  G2   Patient is to be NPO after:   0430    Dental hx: []  Dentures:  [x]  N/A      []  Bridge or Partial:                   []  Loose or Damaged teeth:    Comments: Patient was given the 5 CHG shower / bath instructions for THA surgery along with 2 bottles of the CHG soap. Patient will start this on: 03-15-24   Activity level: Patient is able to climb a flight of stairs without difficulty; [x]  No CP  [x]  No SOB, but would have leg pain.  Patient can perform ADLs without assistance.    Anesthesia review: A.fib (had DCCV 09-12-23 at [facility]), HTN, GERD, PONV, OSA-CPAP, depression, HOH- no HAs, Pre-DM   Patient denies shortness of breath, fever, cough and chest pain at PAT appointment.   Patient verbalized understanding and agreement to the Pre-Surgical Instructions that were given to them at this PAT appointment. Patient was also educated of the need to review these PAT instructions again prior to her surgery.I reviewed the  appropriate phone numbers to call if they have any and questions or concerns.

## 2024-03-08 ENCOUNTER — Encounter (HOSPITAL_COMMUNITY): Payer: Self-pay

## 2024-03-08 ENCOUNTER — Encounter (HOSPITAL_COMMUNITY)
Admission: RE | Admit: 2024-03-08 | Discharge: 2024-03-08 | Disposition: A | Discharge status: Home / Self Care | Payer: Medicare (Managed Care) | Source: Ambulatory Visit | Attending: Orthopaedic Surgery | Admitting: Orthopaedic Surgery

## 2024-03-08 ENCOUNTER — Other Ambulatory Visit: Payer: Self-pay

## 2024-03-08 ENCOUNTER — Ambulatory Visit (HOSPITAL_COMMUNITY)
Admission: RE | Admit: 2024-03-08 | Discharge: 2024-03-08 | Disposition: A | Discharge status: Home / Self Care | Payer: Medicare (Managed Care) | Source: Ambulatory Visit | Attending: Orthopaedic Surgery | Admitting: Orthopaedic Surgery

## 2024-03-08 VITALS — BP 128/62 | HR 52 | Temp 98.6°F | Resp 16 | Ht 68.0 in | Wt 240.0 lb

## 2024-03-08 DIAGNOSIS — I10 Essential (primary) hypertension: Secondary | ICD-10-CM

## 2024-03-08 DIAGNOSIS — M1612 Unilateral primary osteoarthritis, left hip: Secondary | ICD-10-CM | POA: Diagnosis not present

## 2024-03-08 DIAGNOSIS — Z01818 Encounter for other preprocedural examination: Secondary | ICD-10-CM | POA: Diagnosis not present

## 2024-03-08 LAB — CBC
HCT: 41.1 % (ref 36.0–46.0)
Hemoglobin: 13.1 g/dL (ref 12.0–15.0)
MCH: 30.3 pg (ref 26.0–34.0)
MCHC: 31.9 g/dL (ref 30.0–36.0)
MCV: 95.1 fL (ref 80.0–100.0)
Platelets: 173 K/uL (ref 150–400)
RBC: 4.32 MIL/uL (ref 3.87–5.11)
RDW: 13.7 % (ref 11.5–15.5)
WBC: 5 K/uL (ref 4.0–10.5)
nRBC: 0 % (ref 0.0–0.2)

## 2024-03-08 LAB — BASIC METABOLIC PANEL WITH GFR
Anion gap: 9 (ref 5–15)
BUN: 15 mg/dL (ref 8–23)
CO2: 26 mmol/L (ref 22–32)
Calcium: 9.2 mg/dL (ref 8.9–10.3)
Chloride: 105 mmol/L (ref 98–111)
Creatinine, Ser: 0.62 mg/dL (ref 0.44–1.00)
GFR, Estimated: >60 mL/min (ref >60)
Glucose, Bld: 95 mg/dL (ref 70–99)
Potassium: 3.9 mmol/L (ref 3.5–5.1)
Sodium: 140 mmol/L (ref 135–145)

## 2024-03-08 LAB — SURGICAL PCR SCREEN
MRSA, PCR: NEGATIVE
Staphylococcus aureus: NEGATIVE

## 2024-03-11 NOTE — Anesthesia Preprocedure Evaluation (Addendum)
 Anesthesia Evaluation  Patient identified by MRN, date of birth, ID band Patient awake    Reviewed: Allergy & Precautions, H&P , NPO status , Patient's Chart, lab work & pertinent test results  History of Anesthesia Complications (+) PONV and history of anesthetic complications  Airway Mallampati: II   Neck ROM: full    Dental   Pulmonary sleep apnea    breath sounds clear to auscultation       Cardiovascular hypertension, + dysrhythmias Atrial Fibrillation  Rhythm:irregular Rate:Normal     Neuro/Psych  Headaches PSYCHIATRIC DISORDERS  Depression       GI/Hepatic ,GERD  ,,  Endo/Other  Hypothyroidism    Renal/GU      Musculoskeletal  (+) Arthritis ,    Abdominal   Peds  Hematology   Anesthesia Other Findings   Reproductive/Obstetrics                              Anesthesia Physical Anesthesia Plan  ASA: 3  Anesthesia Plan: MAC and Spinal   Post-op Pain Management:    Induction: Intravenous  PONV Risk Score and Plan: 3 and Propofol  infusion, Treatment may vary due to age or medical condition and Ondansetron   Airway Management Planned: Simple Face Mask  Additional Equipment:   Intra-op Plan:   Post-operative Plan:   Informed Consent: I have reviewed the patients History and Physical, chart, labs and discussed the procedure including the risks, benefits and alternatives for the proposed anesthesia with the patient or authorized representative who has indicated his/her understanding and acceptance.     Dental advisory given  Plan Discussed with: CRNA, Anesthesiologist and Surgeon  Anesthesia Plan Comments: (See PAT note 03/08/24)         Anesthesia Quick Evaluation

## 2024-03-11 NOTE — Progress Notes (Signed)
 Anesthesia Chart Review   Case: [id number] Date/Time: 03/19/24 0715   Procedure: ARTHROPLASTY, HIP, TOTAL, ANTERIOR APPROACH (Left: Hip)   Anesthesia type: Spinal   Pre-op diagnosis: LEFT HIP DEGENERATIVE JOINT DISEASE   Location: [facility] ROOM 06 / [facility]   Surgeons: Sheril Coy, MD       DISCUSSION:73 y.o. never smoker with h/o PONV, HTN, sleep apnea uses cpap, hypothyroidism, paroxysmal a-fib, breast cancer, left hip djd scheduled for above procedure 03/19/24 with Dr. Coy Sheril.   Surgery previously postponed due to prolonged episode required cardioversion.   Per cardiology preoperative evaluation 02/14/2024, Chart reviewed as part of pre-operative protocol coverage. According to the RCRI, patient has a 0.4% risk of MACE. Patient reports activity equivalent to >4.0 METS (swims for an hour 3 days a week).    Given past medical history and time since last visit, based on ACC/AHA guidelines, Robin Christensen would be at acceptable risk for the planned procedure without further cardiovascular testing.    Patient was advised that if she develops new symptoms prior to surgery to contact our office to arrange a follow-up appointment.  she verbalized understanding.   Per Pharm D, patient  may hold Eliquis  for 3 days prior to procedure.    Pt reports last dose of Eliquis  03/15/2024.   Echo 06/02/11 (Per Dr. Ladona notes): Normal LVEF. Mild LVH. No significant valvular abnormalities.    Lexiscan stress 05/27/11 (Per Dr. Ladona notes): Normal perfusion. Normal LVEF. No ischemia. VS: BP 124/77   Pulse (!) 59   Temp 36.6 C (Oral)   Resp 16   Ht 5' 8 (1.727 m)   Wt 116.1 kg   SpO2 97%   BMI 38.92 kg/m  VS: BP 128/62 Comment: right arm sitting  Pulse (!) 52   Temp 37 C (Oral)   Resp 16   Ht 5' 8 (1.727 m)   Wt 108.9 kg   SpO2 97%   BMI 36.49 kg/m   PROVIDERS: Kip Righter, MD is PCP   Primary Cardiologist:  Wilbert Bihari, MD Neurology- Juliene Dunnings, MD  LABS: Labs  reviewed: Acceptable for surgery. (all labs ordered are listed, but only abnormal results are displayed)  Labs Reviewed  SURGICAL PCR SCREEN  BASIC METABOLIC PANEL WITH GFR  CBC  TYPE AND SCREEN     IMAGES:   EKG:   CV:  Past Medical History:  Diagnosis Date   A-fib (HCC)    Arthritis    B12 deficiency    Back pain    Breast cancer (HCC)    Cancer (HCC)    Right Breast Cancer   Chronic headaches    Dysrhythmia    AF   Edema, lower extremity    Gallbladder disease    GERD (gastroesophageal reflux disease)    Hyperlipidemia    Hypertension    Hypothyroidism    Joint pain    Obesity    Pneumonia    PONV (postoperative nausea and vomiting)    Sleep apnea    cpcap   Vitamin deficiency     Past Surgical History:  Procedure Laterality Date   BREAST SURGERY     CARDIOVERSION N/A 06/26/2014   Procedure: CARDIOVERSION;  Surgeon: Erick JONELLE Ladona, MD;  Location: [facility] ENDOSCOPY;  Service: Cardiovascular;  Laterality: N/A;   CARDIOVERSION  09/07/2023   done at [facility]   CATARACT EXTRACTION Bilateral    CHOLECYSTECTOMY     DILATION AND CURETTAGE OF UTERUS     eyelid surgery  TOTAL KNEE ARTHROPLASTY Right 05/03/2016   Procedure: TOTAL KNEE ARTHROPLASTY;  Surgeon: Maude Herald, MD;  Location: [facility] OR;  Service: Orthopedics;  Laterality: Right;   TOTAL KNEE ARTHROPLASTY Left 08/14/2018   Procedure: TOTAL KNEE ARTHROPLASTY;  Surgeon: Herald Maude, MD;  Location: [facility] OR;  Service: Orthopedics;  Laterality: Left;    MEDICATIONS:  acetaminophen  (TYLENOL ) 650 MG CR tablet   apixaban  (ELIQUIS ) 5 MG TABS tablet   baclofen  (LIORESAL ) 10 MG tablet   Biotin 5000 MCG CAPS   Calcium  Carb-Cholecalciferol (CALCIUM  600 + D PO)   cholecalciferol (VITAMIN D3) 25 MCG (1000 UNIT) tablet   furosemide  (LASIX ) 40 MG tablet   gabapentin  (NEURONTIN ) 300 MG capsule   levothyroxine  (SYNTHROID , LEVOTHROID) 75 MCG tablet   Magnesium 250 MG TABS   oxybutynin (DITROPAN-XL)  10 MG 24 hr tablet   rosuvastatin  (CRESTOR ) 10 MG tablet   sertraline  (ZOLOFT ) 50 MG tablet   vitamin B-12 (CYANOCOBALAMIN ) 1000 MCG tablet   zonisamide  (ZONEGRAN ) 25 MG capsule   No current facility-administered medications for this encounter.    Harlene Hoots Ward, PA-C [facility] Pre-Surgical Testing [phone]

## 2024-03-11 NOTE — H&P (Signed)
 TOTAL HIP ADMISSION H&P  Patient is admitted for left total hip arthroplasty.  Subjective:  Chief Complaint: left hip pain  HPI: Robin Christensen, 73 y.o. female, has a history of pain and functional disability in the left hip(s) due to arthritis and patient has failed non-surgical conservative treatments for greater than 12 weeks to include NSAID's and/or analgesics, flexibility and strengthening excercises, supervised PT with diminished ADL's post treatment, use of assistive devices, weight reduction as appropriate, and activity modification.  Onset of symptoms was gradual starting 5 years ago with gradually worsening course since that time.The patient noted no past surgery on the left hip(s).  Patient currently rates pain in the left hip at 10 out of 10 with activity. Patient has night pain, worsening of pain with activity and weight bearing, trendelenberg gait, pain that interfers with activities of daily living, and crepitus. Patient has evidence of subchondral cysts, subchondral sclerosis, periarticular osteophytes, and joint space narrowing by imaging studies. This condition presents safety issues increasing the risk of falls. There is no current active infection.  Patient Active Problem List   Diagnosis Date Noted   Acquired thrombophilia (HCC) 02/06/2024   Hardening of the aorta (main artery of the heart) (HCC) 02/06/2024   Hypothyroidism 02/06/2024   Idiopathic sleep related nonobstructive alveolar hypoventilation 02/06/2024   Long term current use of anticoagulant therapy 02/06/2024   Obstructive sleep apnea syndrome 02/06/2024   S/P knee replacement 02/06/2024   Vitamin B12 deficiency (non anemic) 02/06/2024   Other fatigue increased 06/01/2020   Atrial fibrillation (HCC) 04/23/2020   Vitamin D  deficiency 04/08/2020   Hyperglycemia 03/12/2020   Essential hypertension 03/12/2020   Mixed hyperlipidemia 01/14/2020   Lower extremity edema 01/14/2020   Depression 01/14/2020    Class 1 obesity with serious comorbidity and body mass index (BMI) of 34.0 to 34.9 in adult 01/14/2020   Primary osteoarthritis of left knee 08/14/2018   Primary localized osteoarthritis of right knee 05/03/2016   Primary osteoarthritis of right knee 05/03/2016   Abnormal auditory perception of both ears 12/01/2015   Past Medical History:  Diagnosis Date   A-fib (HCC)    Arthritis    B12 deficiency    Back pain    Breast cancer (HCC)    Cancer (HCC)    Right Breast Cancer   Chronic headaches    Dysrhythmia    AF   Edema, lower extremity    Gallbladder disease    GERD (gastroesophageal reflux disease)    Hyperlipidemia    Hypertension    Hypothyroidism    Joint pain    Obesity    Pneumonia    PONV (postoperative nausea and vomiting)    Sleep apnea    cpcap   Vitamin deficiency     Past Surgical History:  Procedure Laterality Date   BREAST SURGERY     CARDIOVERSION N/A 06/26/2014   Procedure: CARDIOVERSION;  Surgeon: Erick JONELLE Bergamo, MD;  Location: [facility] ENDOSCOPY;  Service: Cardiovascular;  Laterality: N/A;   CARDIOVERSION  09/07/2023   done at [facility]   CATARACT EXTRACTION Bilateral    CHOLECYSTECTOMY     DILATION AND CURETTAGE OF UTERUS     eyelid surgery     TOTAL KNEE ARTHROPLASTY Right 05/03/2016   Procedure: TOTAL KNEE ARTHROPLASTY;  Surgeon: Maude Herald, MD;  Location: [facility] OR;  Service: Orthopedics;  Laterality: Right;   TOTAL KNEE ARTHROPLASTY Left 08/14/2018   Procedure: TOTAL KNEE ARTHROPLASTY;  Surgeon: Herald Maude, MD;  Location: [facility] OR;  Service: Orthopedics;  Laterality: Left;    No current facility-administered medications for this encounter.   Current Outpatient Medications  Medication Sig Dispense Refill Last Dose/Taking   acetaminophen  (TYLENOL ) 650 MG CR tablet Take 1,300 mg by mouth every 8 (eight) hours.   Taking   apixaban  (ELIQUIS ) 5 MG TABS tablet Take 1 tablet (5 mg total) by mouth 2 (two) times daily. 180 tablet 0 Taking    baclofen  (LIORESAL ) 10 MG tablet Take 1/2 tablet to 1 tablet up to three times daily as needed for headache. 30 each 5 Taking   Biotin 5000 MCG CAPS Take 10,000 mcg by mouth daily.   Taking   Calcium  Carb-Cholecalciferol (CALCIUM  600 + D PO) Take 2 tablets by mouth daily.   Taking   cholecalciferol (VITAMIN D3) 25 MCG (1000 UNIT) tablet Take 1,000 Units by mouth daily.   Taking   furosemide  (LASIX ) 40 MG tablet Take 1 tablet (40 mg total) by mouth daily. 90 tablet 3 Taking   gabapentin  (NEURONTIN ) 300 MG capsule Take 300-600 mg by mouth See admin instructions. 300 mg  in the morning, 300 mg  in afternoon, and 600 mg at bedtime   Taking   levothyroxine  (SYNTHROID , LEVOTHROID) 75 MCG tablet Take 75 mcg by mouth daily before breakfast.   Taking   Magnesium 250 MG TABS Take 500 mg by mouth daily. Citrate   Taking   oxybutynin (DITROPAN-XL) 10 MG 24 hr tablet Take 10 mg by mouth daily.   Taking   rosuvastatin  (CRESTOR ) 10 MG tablet Take 1 tablet (10 mg total) by mouth daily. 90 tablet 3 Taking   sertraline  (ZOLOFT ) 50 MG tablet Take 1 tablet (50 mg total) by mouth daily. 30 tablet 5 Taking   vitamin B-12 (CYANOCOBALAMIN ) 1000 MCG tablet Take 1,000 mcg by mouth daily.   Taking   zonisamide  (ZONEGRAN ) 25 MG capsule Take 3 capsules (75 mg total) by mouth daily. (Patient not taking: Reported on 02/14/2024) 90 capsule 5    Allergies  Allergen Reactions   Rivaroxaban  Other (See Comments)    JOINT ACHES/PAIN     Social History   Tobacco Use   Smoking status: Never   Smokeless tobacco: Never  Substance Use Topics   Alcohol use: Yes    Comment: once a year, maybe    Family History  Problem Relation Age of Onset   High blood pressure Mother    Heart disease Mother    Cancer Mother    Depression Mother    Obesity Mother    Heart disease Father    Kidney disease Father    Cancer Father      Review of Systems  Musculoskeletal:  Positive for arthralgias.       Left hip  All other systems  reviewed and are negative.   Objective:  Physical Exam Constitutional:      Appearance: Normal appearance.  HENT:     Head: Normocephalic and atraumatic.     Nose: Nose normal.     Mouth/Throat:     Pharynx: Oropharynx is clear.  Eyes:     Extraocular Movements: Extraocular movements intact.  Pulmonary:     Effort: Pulmonary effort is normal.  Abdominal:     Palpations: Abdomen is soft.  Musculoskeletal:     Cervical back: Normal range of motion.     Comments: Left hip is extremely painful to internal rotation.  She also has some stiffness.  Straight leg raise is negative.  Leg lengths look equal.  Sensation and motor function are intact distally with palpable pulses in her feet.    Skin:    General: Skin is warm and dry.  Neurological:     General: No focal deficit present.     Mental Status: She is alert and oriented to person, place, and time.  Psychiatric:        Mood and Affect: Mood normal.        Behavior: Behavior normal.        Thought Content: Thought content normal.        Judgment: Judgment normal.     Vital signs in last 24 hours:    Labs:   Estimated body mass index is 36.49 kg/m as calculated from the following:   Height as of 03/08/24: 5' 8 (1.727 m).   Weight as of 03/08/24: 108.9 kg.   Imaging Review Plain radiographs demonstrate severe degenerative joint disease of the left hip(s). The bone quality appears to be good for age and reported activity level.      Assessment/Plan:  End stage primary arthritis, left hip(s)  The patient history, physical examination, clinical judgement of the provider and imaging studies are consistent with end stage degenerative joint disease of the left hip(s) and total hip arthroplasty is deemed medically necessary. The treatment options including medical management, injection therapy, arthroscopy and arthroplasty were discussed at length. The risks and benefits of total hip arthroplasty were presented and  reviewed. The risks due to aseptic loosening, infection, stiffness, dislocation/subluxation,  thromboembolic complications and other imponderables were discussed.  The patient acknowledged the explanation, agreed to proceed with the plan and consent was signed. Patient is being admitted for inpatient treatment for surgery, pain control, PT, OT, prophylactic antibiotics, VTE prophylaxis, progressive ambulation and ADL's and discharge planning.The patient is planning to be discharged home with home health services

## 2024-03-14 ENCOUNTER — Ambulatory Visit: Payer: Self-pay | Admitting: Neurology

## 2024-03-14 NOTE — Progress Notes (Signed)
 Tried calling patient no answer. LMOVM for patient to call back.

## 2024-03-15 NOTE — Progress Notes (Signed)
 Patient advised.

## 2024-03-15 NOTE — Telephone Encounter (Signed)
 Pt. Cld bk for results

## 2024-03-18 MED ORDER — TRANEXAMIC ACID 1000 MG/10ML IV SOLN
2000.0000 mg | INTRAVENOUS | Status: DC
  Filled 2024-03-18: qty 20

## 2024-03-19 ENCOUNTER — Ambulatory Visit (HOSPITAL_COMMUNITY): Payer: Medicare (Managed Care) | Admitting: Certified Registered Nurse Anesthetist

## 2024-03-19 ENCOUNTER — Other Ambulatory Visit: Payer: Self-pay

## 2024-03-19 ENCOUNTER — Ambulatory Visit (HOSPITAL_COMMUNITY): Payer: Medicare (Managed Care) | Admitting: Physician Assistant

## 2024-03-19 ENCOUNTER — Encounter (HOSPITAL_COMMUNITY): Payer: Self-pay | Admitting: Orthopaedic Surgery

## 2024-03-19 ENCOUNTER — Encounter (HOSPITAL_COMMUNITY)
Admission: AD | Disposition: A | Discharge status: Home / Self Care | Payer: Self-pay | Source: Home / Self Care | Attending: Orthopaedic Surgery

## 2024-03-19 ENCOUNTER — Ambulatory Visit (HOSPITAL_COMMUNITY): Payer: Medicare (Managed Care)

## 2024-03-19 ENCOUNTER — Inpatient Hospital Stay (HOSPITAL_COMMUNITY)
Admission: AD | Admit: 2024-03-19 | Discharge: 2024-03-22 | DRG: 470 | Disposition: A | Discharge status: Home / Self Care | Payer: Medicare (Managed Care) | Attending: Orthopaedic Surgery | Admitting: Orthopaedic Surgery

## 2024-03-19 DIAGNOSIS — Z79899 Other long term (current) drug therapy: Secondary | ICD-10-CM | POA: Diagnosis not present

## 2024-03-19 DIAGNOSIS — I10 Essential (primary) hypertension: Secondary | ICD-10-CM

## 2024-03-19 DIAGNOSIS — D6859 Other primary thrombophilia: Secondary | ICD-10-CM | POA: Diagnosis present

## 2024-03-19 DIAGNOSIS — Z888 Allergy status to other drugs, medicaments and biological substances status: Secondary | ICD-10-CM

## 2024-03-19 DIAGNOSIS — E782 Mixed hyperlipidemia: Secondary | ICD-10-CM | POA: Diagnosis present

## 2024-03-19 DIAGNOSIS — Z96642 Presence of left artificial hip joint: Secondary | ICD-10-CM | POA: Diagnosis present

## 2024-03-19 DIAGNOSIS — Z853 Personal history of malignant neoplasm of breast: Secondary | ICD-10-CM

## 2024-03-19 DIAGNOSIS — R519 Headache, unspecified: Secondary | ICD-10-CM | POA: Diagnosis present

## 2024-03-19 DIAGNOSIS — E039 Hypothyroidism, unspecified: Secondary | ICD-10-CM | POA: Diagnosis present

## 2024-03-19 DIAGNOSIS — K219 Gastro-esophageal reflux disease without esophagitis: Secondary | ICD-10-CM | POA: Diagnosis present

## 2024-03-19 DIAGNOSIS — E538 Deficiency of other specified B group vitamins: Secondary | ICD-10-CM | POA: Diagnosis present

## 2024-03-19 DIAGNOSIS — Z9049 Acquired absence of other specified parts of digestive tract: Secondary | ICD-10-CM

## 2024-03-19 DIAGNOSIS — E559 Vitamin D deficiency, unspecified: Secondary | ICD-10-CM | POA: Diagnosis present

## 2024-03-19 DIAGNOSIS — I4891 Unspecified atrial fibrillation: Secondary | ICD-10-CM | POA: Diagnosis not present

## 2024-03-19 DIAGNOSIS — Z6837 Body mass index (BMI) 37.0-37.9, adult: Secondary | ICD-10-CM

## 2024-03-19 DIAGNOSIS — F32A Depression, unspecified: Secondary | ICD-10-CM | POA: Diagnosis present

## 2024-03-19 DIAGNOSIS — G4733 Obstructive sleep apnea (adult) (pediatric): Secondary | ICD-10-CM | POA: Diagnosis present

## 2024-03-19 DIAGNOSIS — M1612 Unilateral primary osteoarthritis, left hip: Secondary | ICD-10-CM | POA: Diagnosis present

## 2024-03-19 DIAGNOSIS — Z79890 Hormone replacement therapy (postmenopausal): Secondary | ICD-10-CM

## 2024-03-19 DIAGNOSIS — Z818 Family history of other mental and behavioral disorders: Secondary | ICD-10-CM | POA: Diagnosis not present

## 2024-03-19 DIAGNOSIS — Z8249 Family history of ischemic heart disease and other diseases of the circulatory system: Secondary | ICD-10-CM

## 2024-03-19 DIAGNOSIS — E66812 Obesity, class 2: Secondary | ICD-10-CM | POA: Diagnosis present

## 2024-03-19 DIAGNOSIS — Z8701 Personal history of pneumonia (recurrent): Secondary | ICD-10-CM | POA: Diagnosis not present

## 2024-03-19 DIAGNOSIS — Z7901 Long term (current) use of anticoagulants: Secondary | ICD-10-CM | POA: Diagnosis not present

## 2024-03-19 DIAGNOSIS — Z9841 Cataract extraction status, right eye: Secondary | ICD-10-CM | POA: Diagnosis not present

## 2024-03-19 DIAGNOSIS — Z96653 Presence of artificial knee joint, bilateral: Secondary | ICD-10-CM | POA: Diagnosis present

## 2024-03-19 DIAGNOSIS — Z9842 Cataract extraction status, left eye: Secondary | ICD-10-CM | POA: Diagnosis not present

## 2024-03-19 DIAGNOSIS — M25552 Pain in left hip: Secondary | ICD-10-CM | POA: Diagnosis present

## 2024-03-19 HISTORY — PX: TOTAL HIP ARTHROPLASTY: SHX124

## 2024-03-19 LAB — HEMOGLOBIN AND HEMATOCRIT, BLOOD
HCT: 32.5 % — ABNORMAL LOW (ref 36.0–46.0)
Hemoglobin: 10.1 g/dL — ABNORMAL LOW (ref 12.0–15.0)

## 2024-03-19 SURGERY — ARTHROPLASTY, HIP, TOTAL, ANTERIOR APPROACH
Anesthesia: Monitor Anesthesia Care | Site: Hip | Laterality: Left

## 2024-03-19 MED ORDER — OXYCODONE HCL 5 MG PO TABS
5.0000 mg | ORAL_TABLET | Freq: Once | ORAL | Status: AC | PRN
  Administered 2024-03-19: 5 mg via ORAL

## 2024-03-19 MED ORDER — HYDROCODONE-ACETAMINOPHEN 5-325 MG PO TABS: 1.0000 | ORAL_TABLET | Freq: Four times a day (QID) | ORAL | 0 refills | Status: AC | PRN

## 2024-03-19 MED ORDER — TRANEXAMIC ACID 1000 MG/10ML IV SOLN
INTRAVENOUS | Status: DC | PRN
  Administered 2024-03-19: 2000 mg via TOPICAL

## 2024-03-19 MED ORDER — ROCURONIUM BROMIDE 10 MG/ML (PF) SYRINGE
PREFILLED_SYRINGE | INTRAVENOUS | Status: DC | PRN
  Administered 2024-03-19 (×2): 10 mg via INTRAVENOUS
  Administered 2024-03-19: 50 mg via INTRAVENOUS

## 2024-03-19 MED ORDER — CEFAZOLIN SODIUM-DEXTROSE 2-4 GM/100ML-% IV SOLN
2.0000 g | Freq: Four times a day (QID) | INTRAVENOUS | Status: AC
  Administered 2024-03-19 (×2): 2 g via INTRAVENOUS
  Filled 2024-03-19 (×2): qty 100

## 2024-03-19 MED ORDER — DIPHENHYDRAMINE HCL 12.5 MG/5ML PO ELIX: 12.5000 mg | ORAL_SOLUTION | ORAL | Status: DC | PRN

## 2024-03-19 MED ORDER — LACTATED RINGERS IV SOLN: INTRAVENOUS | Status: DC

## 2024-03-19 MED ORDER — ONDANSETRON HCL 4 MG PO TABS: 4.0000 mg | ORAL_TABLET | Freq: Four times a day (QID) | ORAL | Status: DC | PRN

## 2024-03-19 MED ORDER — HYDROMORPHONE HCL 1 MG/ML IJ SOLN
INTRAMUSCULAR | Status: AC
  Filled 2024-03-19: qty 1

## 2024-03-19 MED ORDER — PROPOFOL 10 MG/ML IV BOLUS
INTRAVENOUS | Status: DC | PRN
  Administered 2024-03-19: 140 mg via INTRAVENOUS

## 2024-03-19 MED ORDER — ROSUVASTATIN CALCIUM 10 MG PO TABS
10.0000 mg | ORAL_TABLET | Freq: Every day | ORAL | Status: DC
  Administered 2024-03-19 – 2024-03-22 (×4): 10 mg via ORAL
  Filled 2024-03-19 (×4): qty 1

## 2024-03-19 MED ORDER — POVIDONE-IODINE 10 % EX SWAB: 2.0000 | Freq: Once | CUTANEOUS | Status: DC

## 2024-03-19 MED ORDER — DOCUSATE SODIUM 100 MG PO CAPS
100.0000 mg | ORAL_CAPSULE | Freq: Two times a day (BID) | ORAL | Status: DC
  Administered 2024-03-19 – 2024-03-22 (×6): 100 mg via ORAL
  Filled 2024-03-19 (×6): qty 1

## 2024-03-19 MED ORDER — PHENYLEPHRINE HCL-NACL 20-0.9 MG/250ML-% IV SOLN
INTRAVENOUS | Status: DC | PRN
  Administered 2024-03-19: 40 ug/min via INTRAVENOUS

## 2024-03-19 MED ORDER — HYDROMORPHONE HCL 1 MG/ML IJ SOLN
0.2500 mg | INTRAMUSCULAR | Status: DC | PRN
  Administered 2024-03-19: 0.5 mg via INTRAVENOUS

## 2024-03-19 MED ORDER — ORAL CARE MOUTH RINSE: 15.0000 mL | Freq: Once | OROMUCOSAL | Status: AC

## 2024-03-19 MED ORDER — METOCLOPRAMIDE HCL 5 MG/ML IJ SOLN: 5.0000 mg | Freq: Three times a day (TID) | INTRAMUSCULAR | Status: DC | PRN

## 2024-03-19 MED ORDER — HYDROCODONE-ACETAMINOPHEN 7.5-325 MG PO TABS
1.0000 | ORAL_TABLET | ORAL | Status: DC | PRN
  Administered 2024-03-19 – 2024-03-20 (×5): 2 via ORAL
  Filled 2024-03-19 (×6): qty 2

## 2024-03-19 MED ORDER — LIDOCAINE 2% (20 MG/ML) 5 ML SYRINGE
INTRAMUSCULAR | Status: DC | PRN
  Administered 2024-03-19: 60 mg via INTRAVENOUS

## 2024-03-19 MED ORDER — CEFAZOLIN SODIUM-DEXTROSE 2-4 GM/100ML-% IV SOLN
INTRAVENOUS | Status: AC
  Filled 2024-03-19: qty 100

## 2024-03-19 MED ORDER — FUROSEMIDE 40 MG PO TABS
40.0000 mg | ORAL_TABLET | Freq: Every day | ORAL | Status: DC
  Filled 2024-03-19 (×3): qty 1

## 2024-03-19 MED ORDER — BACLOFEN 10 MG PO TABS
5.0000 mg | ORAL_TABLET | Freq: Three times a day (TID) | ORAL | Status: DC | PRN
  Administered 2024-03-20: 10 mg via ORAL
  Filled 2024-03-19: qty 1

## 2024-03-19 MED ORDER — FENTANYL CITRATE PF 50 MCG/ML IJ SOSY
PREFILLED_SYRINGE | INTRAMUSCULAR | Status: AC
  Filled 2024-03-19: qty 1

## 2024-03-19 MED ORDER — MAGNESIUM OXIDE -MG SUPPLEMENT 400 (240 MG) MG PO TABS
400.0000 mg | ORAL_TABLET | Freq: Every day | ORAL | Status: DC
  Administered 2024-03-20 – 2024-03-22 (×3): 400 mg via ORAL
  Filled 2024-03-19 (×3): qty 1

## 2024-03-19 MED ORDER — BUPIVACAINE LIPOSOME 1.3 % IJ SUSP
INTRAMUSCULAR | Status: AC
  Filled 2024-03-19: qty 20

## 2024-03-19 MED ORDER — ONDANSETRON HCL 4 MG/2ML IJ SOLN
4.0000 mg | Freq: Four times a day (QID) | INTRAMUSCULAR | Status: DC | PRN
  Administered 2024-03-21: 4 mg via INTRAVENOUS
  Filled 2024-03-19: qty 2

## 2024-03-19 MED ORDER — FENTANYL CITRATE (PF) 100 MCG/2ML IJ SOLN
INTRAMUSCULAR | Status: DC | PRN
  Administered 2024-03-19 (×2): 50 ug via INTRAVENOUS
  Administered 2024-03-19: 100 ug via INTRAVENOUS

## 2024-03-19 MED ORDER — SUGAMMADEX SODIUM 200 MG/2ML IV SOLN
INTRAVENOUS | Status: DC | PRN
Start: 2024-03-19 — End: 2024-03-19
  Administered 2024-03-19: 200 mg via INTRAVENOUS

## 2024-03-19 MED ORDER — PHENYLEPHRINE HCL (PRESSORS) 10 MG/ML IV SOLN
INTRAVENOUS | Status: AC
  Filled 2024-03-19: qty 1

## 2024-03-19 MED ORDER — LEVOTHYROXINE SODIUM 75 MCG PO TABS
75.0000 ug | ORAL_TABLET | Freq: Every day | ORAL | Status: DC
  Administered 2024-03-20 – 2024-03-22 (×3): 75 ug via ORAL
  Filled 2024-03-19 (×3): qty 1

## 2024-03-19 MED ORDER — 0.9 % SODIUM CHLORIDE (POUR BTL) OPTIME
TOPICAL | Status: DC | PRN
  Administered 2024-03-19: 1000 mL

## 2024-03-19 MED ORDER — MENTHOL 3 MG MT LOZG: 1.0000 | LOZENGE | OROMUCOSAL | Status: DC | PRN

## 2024-03-19 MED ORDER — TRANEXAMIC ACID-NACL 1000-0.7 MG/100ML-% IV SOLN
1000.0000 mg | Freq: Once | INTRAVENOUS | Status: AC
  Administered 2024-03-19: 1000 mg via INTRAVENOUS
  Filled 2024-03-19: qty 100

## 2024-03-19 MED ORDER — PHENOL 1.4 % MT LIQD: 1.0000 | OROMUCOSAL | Status: DC | PRN

## 2024-03-19 MED ORDER — ACETAMINOPHEN 325 MG PO TABS
325.0000 mg | ORAL_TABLET | Freq: Four times a day (QID) | ORAL | Status: DC | PRN
  Administered 2024-03-21: 650 mg via ORAL
  Filled 2024-03-19: qty 2

## 2024-03-19 MED ORDER — ONDANSETRON HCL 4 MG/2ML IJ SOLN
INTRAMUSCULAR | Status: DC | PRN
  Administered 2024-03-19: 4 mg via INTRAVENOUS

## 2024-03-19 MED ORDER — BISACODYL 5 MG PO TBEC
5.0000 mg | DELAYED_RELEASE_TABLET | Freq: Every day | ORAL | Status: DC | PRN
  Administered 2024-03-21 – 2024-03-22 (×2): 5 mg via ORAL
  Filled 2024-03-19 (×2): qty 1

## 2024-03-19 MED ORDER — FENTANYL CITRATE PF 50 MCG/ML IJ SOSY
25.0000 ug | PREFILLED_SYRINGE | INTRAMUSCULAR | Status: DC | PRN
  Administered 2024-03-19 (×3): 50 ug via INTRAVENOUS

## 2024-03-19 MED ORDER — ALBUMIN HUMAN 5 % IV SOLN
INTRAVENOUS | Status: AC
  Filled 2024-03-19: qty 500

## 2024-03-19 MED ORDER — KETOROLAC TROMETHAMINE 15 MG/ML IJ SOLN
7.5000 mg | Freq: Four times a day (QID) | INTRAMUSCULAR | Status: AC
  Administered 2024-03-19 – 2024-03-20 (×4): 7.5 mg via INTRAVENOUS
  Filled 2024-03-19 (×4): qty 1

## 2024-03-19 MED ORDER — HYDROCODONE-ACETAMINOPHEN 5-325 MG PO TABS
1.0000 | ORAL_TABLET | ORAL | Status: DC | PRN
  Administered 2024-03-21 – 2024-03-22 (×5): 1 via ORAL
  Administered 2024-03-22: 2 via ORAL
  Filled 2024-03-19 (×5): qty 1
  Filled 2024-03-19: qty 2

## 2024-03-19 MED ORDER — TRANEXAMIC ACID-NACL 1000-0.7 MG/100ML-% IV SOLN
1000.0000 mg | INTRAVENOUS | Status: AC
  Administered 2024-03-19: 1000 mg via INTRAVENOUS
  Filled 2024-03-19: qty 100

## 2024-03-19 MED ORDER — FENTANYL CITRATE (PF) 100 MCG/2ML IJ SOLN
INTRAMUSCULAR | Status: AC
Start: 2024-03-19 — End: 2024-03-19
  Filled 2024-03-19: qty 2

## 2024-03-19 MED ORDER — OXYCODONE HCL 5 MG/5ML PO SOLN: 5.0000 mg | Freq: Once | ORAL | Status: AC | PRN

## 2024-03-19 MED ORDER — OXYBUTYNIN CHLORIDE ER 10 MG PO TB24
10.0000 mg | ORAL_TABLET | Freq: Every day | ORAL | Status: DC
  Administered 2024-03-20 – 2024-03-22 (×3): 10 mg via ORAL
  Filled 2024-03-19 (×3): qty 1

## 2024-03-19 MED ORDER — METHOCARBAMOL 500 MG PO TABS
ORAL_TABLET | ORAL | Status: AC
  Filled 2024-03-19: qty 1

## 2024-03-19 MED ORDER — OXYCODONE HCL 5 MG PO TABS
ORAL_TABLET | ORAL | Status: AC
  Filled 2024-03-19: qty 1

## 2024-03-19 MED ORDER — METHOCARBAMOL 500 MG PO TABS
500.0000 mg | ORAL_TABLET | Freq: Four times a day (QID) | ORAL | Status: DC | PRN
  Administered 2024-03-19 – 2024-03-22 (×4): 500 mg via ORAL
  Filled 2024-03-19 (×3): qty 1

## 2024-03-19 MED ORDER — LACTATED RINGERS IV BOLUS
250.0000 mL | Freq: Once | INTRAVENOUS | Status: AC
  Administered 2024-03-19: 250 mL via INTRAVENOUS

## 2024-03-19 MED ORDER — FENTANYL CITRATE PF 50 MCG/ML IJ SOSY
PREFILLED_SYRINGE | INTRAMUSCULAR | Status: AC
Start: 2024-03-19 — End: 2024-03-19
  Filled 2024-03-19: qty 2

## 2024-03-19 MED ORDER — ALUM & MAG HYDROXIDE-SIMETH 200-200-20 MG/5ML PO SUSP: 30.0000 mL | ORAL | Status: DC | PRN

## 2024-03-19 MED ORDER — CHLORHEXIDINE GLUCONATE 0.12 % MT SOLN
15.0000 mL | Freq: Once | OROMUCOSAL | Status: AC
  Administered 2024-03-19: 15 mL via OROMUCOSAL

## 2024-03-19 MED ORDER — GABAPENTIN 300 MG PO CAPS
600.0000 mg | ORAL_CAPSULE | Freq: Every day | ORAL | Status: DC
  Administered 2024-03-19 – 2024-03-21 (×3): 600 mg via ORAL
  Filled 2024-03-19 (×3): qty 2

## 2024-03-19 MED ORDER — GABAPENTIN 300 MG PO CAPS
300.0000 mg | ORAL_CAPSULE | Freq: Two times a day (BID) | ORAL | Status: DC
  Administered 2024-03-19 – 2024-03-22 (×5): 300 mg via ORAL
  Filled 2024-03-19 (×5): qty 1

## 2024-03-19 MED ORDER — PHENYLEPHRINE 80 MCG/ML (10ML) SYRINGE FOR IV PUSH (FOR BLOOD PRESSURE SUPPORT)
PREFILLED_SYRINGE | INTRAVENOUS | Status: DC | PRN
  Administered 2024-03-19: 80 ug via INTRAVENOUS
  Administered 2024-03-19: 120 ug via INTRAVENOUS
  Administered 2024-03-19: 160 ug via INTRAVENOUS

## 2024-03-19 MED ORDER — MORPHINE SULFATE (PF) 2 MG/ML IV SOLN: 0.5000 mg | INTRAVENOUS | Status: DC | PRN

## 2024-03-19 MED ORDER — GLYCOPYRROLATE 0.2 MG/ML IJ SOLN
INTRAMUSCULAR | Status: DC | PRN
  Administered 2024-03-19: .2 mg via INTRAVENOUS

## 2024-03-19 MED ORDER — STERILE WATER FOR IRRIGATION IR SOLN
Status: DC | PRN
  Administered 2024-03-19: 2000 mL

## 2024-03-19 MED ORDER — PROPOFOL 10 MG/ML IV BOLUS
INTRAVENOUS | Status: AC
  Filled 2024-03-19: qty 20

## 2024-03-19 MED ORDER — METOCLOPRAMIDE HCL 5 MG PO TABS: 5.0000 mg | ORAL_TABLET | Freq: Three times a day (TID) | ORAL | Status: DC | PRN

## 2024-03-19 MED ORDER — ACETAMINOPHEN 500 MG PO TABS
500.0000 mg | ORAL_TABLET | Freq: Four times a day (QID) | ORAL | Status: AC
  Administered 2024-03-19: 500 mg via ORAL
  Filled 2024-03-19: qty 1

## 2024-03-19 MED ORDER — TIZANIDINE HCL 4 MG PO TABS
4.0000 mg | ORAL_TABLET | Freq: Four times a day (QID) | ORAL | 1 refills | Status: AC | PRN
Start: 2024-03-19 — End: 2025-03-19

## 2024-03-19 MED ORDER — METHOCARBAMOL 1000 MG/10ML IJ SOLN: 500.0000 mg | Freq: Four times a day (QID) | INTRAMUSCULAR | Status: DC | PRN

## 2024-03-19 MED ORDER — ONDANSETRON HCL 4 MG/2ML IJ SOLN: 4.0000 mg | Freq: Four times a day (QID) | INTRAMUSCULAR | Status: DC | PRN

## 2024-03-19 MED ORDER — CEFAZOLIN SODIUM-DEXTROSE 2-4 GM/100ML-% IV SOLN
2.0000 g | INTRAVENOUS | Status: AC
  Administered 2024-03-19: 3 g via INTRAVENOUS
  Filled 2024-03-19: qty 100

## 2024-03-19 MED ORDER — ALBUMIN HUMAN 5 % IV SOLN: INTRAVENOUS | Status: DC | PRN

## 2024-03-19 MED ORDER — BUPIVACAINE-EPINEPHRINE (PF) 0.25% -1:200000 IJ SOLN
INTRAMUSCULAR | Status: AC
  Filled 2024-03-19: qty 30

## 2024-03-19 MED ORDER — PROMETHAZINE HCL 12.5 MG PO TABS: 12.5000 mg | ORAL_TABLET | Freq: Four times a day (QID) | ORAL | 1 refills | Status: AC | PRN

## 2024-03-19 MED ORDER — BUPIVACAINE LIPOSOME 1.3 % IJ SUSP: 10.0000 mL | Freq: Once | INTRAMUSCULAR | Status: DC

## 2024-03-19 MED ORDER — SERTRALINE HCL 50 MG PO TABS
50.0000 mg | ORAL_TABLET | Freq: Every day | ORAL | Status: DC
Start: 2024-03-19 — End: 2024-03-22
  Administered 2024-03-19 – 2024-03-22 (×4): 50 mg via ORAL
  Filled 2024-03-19 (×5): qty 1

## 2024-03-19 MED ORDER — LACTATED RINGERS IV BOLUS: 500.0000 mL | Freq: Once | INTRAVENOUS | Status: DC

## 2024-03-19 MED ORDER — APIXABAN 5 MG PO TABS
5.0000 mg | ORAL_TABLET | Freq: Two times a day (BID) | ORAL | Status: DC
  Administered 2024-03-20 – 2024-03-22 (×5): 5 mg via ORAL
  Filled 2024-03-19 (×5): qty 1

## 2024-03-19 MED ORDER — BUPIVACAINE-EPINEPHRINE 0.25% -1:200000 IJ SOLN
INTRAMUSCULAR | Status: DC | PRN
  Administered 2024-03-19: 50 mL

## 2024-03-19 MED ORDER — DEXAMETHASONE SODIUM PHOSPHATE 10 MG/ML IJ SOLN
INTRAMUSCULAR | Status: DC | PRN
  Administered 2024-03-19: 10 mg via INTRAVENOUS

## 2024-03-19 MED ORDER — AMISULPRIDE (ANTIEMETIC) 5 MG/2ML IV SOLN
INTRAVENOUS | Status: AC
  Filled 2024-03-19: qty 4

## 2024-03-19 MED ORDER — AMISULPRIDE (ANTIEMETIC) 5 MG/2ML IV SOLN
10.0000 mg | Freq: Once | INTRAVENOUS | Status: AC
  Administered 2024-03-19: 10 mg via INTRAVENOUS

## 2024-03-19 SURGICAL SUPPLY — 44 items
BAG COUNTER SPONGE SURGICOUNT (BAG) IMPLANT
BAG DECANTER FOR FLEXI CONT (MISCELLANEOUS) ×1 IMPLANT
BLADE SAW SGTL 18X1.27X75 (BLADE) ×1 IMPLANT
BOOTIES KNEE HIGH SLOAN (MISCELLANEOUS) ×1 IMPLANT
CELLS DAT CNTRL 66122 CELL SVR (MISCELLANEOUS) ×1 IMPLANT
COVER PERINEAL POST (MISCELLANEOUS) ×1 IMPLANT
COVER SURGICAL LIGHT HANDLE (MISCELLANEOUS) ×1 IMPLANT
CUP ACETAB W/GRIPTION 54 (Plate) IMPLANT
DRAPE FOOT SWITCH (DRAPES) ×1 IMPLANT
DRAPE IMP U-DRAPE 54X76 (DRAPES) ×1 IMPLANT
DRAPE STERI IOBAN 125X83 (DRAPES) ×1 IMPLANT
DRAPE U-SHAPE 47X51 STRL (DRAPES) ×1 IMPLANT
DRESSING AQUACEL AG SP 3.5X6 (GAUZE/BANDAGES/DRESSINGS) IMPLANT
DRSG AQUACEL AG ADV 3.5X 6 (GAUZE/BANDAGES/DRESSINGS) ×1 IMPLANT
DURAPREP 26ML APPLICATOR (WOUND CARE) ×1 IMPLANT
ELECT PENCIL ROCKER SW 15FT (MISCELLANEOUS) ×1 IMPLANT
ELECT REM PT RETURN 15FT ADLT (MISCELLANEOUS) ×1 IMPLANT
ELIMINATOR HOLE APEX DEPUY (Hips) IMPLANT
GLOVE BIO SURGEON STRL SZ8 (GLOVE) ×2 IMPLANT
GLOVE BIOGEL PI IND STRL 7.0 (GLOVE) ×1 IMPLANT
GLOVE BIOGEL PI IND STRL 8 (GLOVE) ×2 IMPLANT
GLOVE SURG SYN 7.0 PF PI (GLOVE) ×1 IMPLANT
GOWN SRG XL LVL 4 BRTHBL STRL (GOWNS) ×1 IMPLANT
GOWN STRL REUS W/ TWL XL LVL3 (GOWN DISPOSABLE) ×2 IMPLANT
HEAD ARTICULEZE (Hips) IMPLANT
HOLDER FOLEY CATH W/STRAP (MISCELLANEOUS) ×1 IMPLANT
KIT TURNOVER KIT A (KITS) ×1 IMPLANT
LINER NEUTRAL 54X36MM PLUS 4 (Hips) IMPLANT
MANIFOLD NEPTUNE II (INSTRUMENTS) ×1 IMPLANT
NDL HYPO 22X1.5 SAFETY MO (MISCELLANEOUS) ×1 IMPLANT
NEEDLE HYPO 22X1.5 SAFETY MO (MISCELLANEOUS) ×1 IMPLANT
NS IRRIG 1000ML POUR BTL (IV SOLUTION) ×1 IMPLANT
PACK ANTERIOR HIP CUSTOM (KITS) ×1 IMPLANT
RETRACTOR WND ALEXIS 18 MED (MISCELLANEOUS) ×1 IMPLANT
SCREW 6.5MMX25MM (Screw) IMPLANT
SPIKE FLUID TRANSFER (MISCELLANEOUS) ×1 IMPLANT
STEM FEM ACTIS STD SZ7 (Nail) IMPLANT
SUT ETHIBOND NAB CT1 #1 30IN (SUTURE) ×2 IMPLANT
SUT VIC AB 1 CT1 36 (SUTURE) ×1 IMPLANT
SUT VIC AB 2-0 CT1 TAPERPNT 27 (SUTURE) ×1 IMPLANT
SUT VICRYL AB 3-0 FS1 BRD 27IN (SUTURE) ×1 IMPLANT
SUTURE STRATFX 0 PDS 27 VIOLET (SUTURE) ×1 IMPLANT
TRAY FOLEY MTR SLVR 16FR STAT (SET/KITS/TRAYS/PACK) ×1 IMPLANT
YANKAUER SUCT BULB TIP NO VENT (SUCTIONS) ×1 IMPLANT

## 2024-03-19 NOTE — Op Note (Signed)
 PRE-OP DIAGNOSIS:  LEFT HIP DEGENERATIVE JOINT DISEASE POST-OP DIAGNOSIS: same PROCEDURE:  LEFT TOTAL HIP ARTHROPLASTY ANTERIOR APPROACH ANESTHESIA:  Spinal and MAC SURGEON:  Maude Herald MD ASSISTANT:  Prentice Earl PA-C   INDICATIONS FOR PROCEDURE:  The patient is a 73 y.o. female with a long history of a painful hip.  This has persisted despite multiple conservative measures.  The patient has persisted with pain and dysfunction making rest and activity difficult.  A total hip replacement is offered as surgical treatment.  Informed operative consent was obtained after discussion of possible complications including reaction to anesthesia, infection, neurovascular injury, dislocation, DVT, PE, and death.  The importance of the postoperative rehab program to optimize result was stressed with the patient.  SUMMARY OF FINDINGS AND PROCEDURE:  Under the above anesthesia through a anterior approach an the Hana table a left THR was performed.  The patient had severe degenerative change and poor bone quality.  We used DePuy components to replace the hip and these were size 7 Actis femur capped with a +5 36mm metal hip ball.  On the acetabular side we used a size 54 Gription shell with a plus 4 neutral polyethylene liner.  I also placed one 25mm acetabular screw due to her poor bone quality. We also did use a hole eliminator.  Prentice Earl PA-C assisted throughout and was invaluable to the completion of the case in that he helped position and retract while I performed the procedure.  He also closed simultaneously to help minimize OR time.  I used fluoroscopy throughout the case to check position of components and leg lengths and read all these views myself.  DESCRIPTION OF PROCEDURE:  The patient was taken to the OR suite where the above anesthetic was applied.  The patient was then positioned on the Hana table supine.  All bony prominences were appropriately padded.  Prep and drape was then performed in normal  sterile fashion.  The patient was given kefzol  preoperative antibiotic and an appropriate time out was performed.  We then took an anterior approach to the left hip.  Dissection was taken through adipose to the tensor fascia lata fascia.  This structure was incised longitudinally and we dissected in the intermuscular interval just medial to this muscle.  Cobra retractors were placed superior and inferior to the femoral neck superficial to the capsule.  A capsular incision was then made and the retractors were placed along the femoral neck.  Xray was brought in to get a good level for the femoral neck cut which was made with an oscillating saw and osteotome.  The femoral head was removed with a corkscrew.  The acetabulum was exposed and some labral tissues were excised. Reaming was taken to the inside wall of the pelvis and sequentially up to 1 mm smaller than the actual component.  A trial of components was done and then the aforementioned acetabular shell was placed in appropriate tilt and anteversion confirmed by fluoroscopy. The liner was placed along with the hole eliminator and attention was turned to the femur.  The leg was brought down and over into adduction and the elevator bar was used to raise the femur up gently in the wound.  The piriformis was released with care taken to preserve the obturator internus attachment and all of the posterior capsule. The femur was reamed and then broached to the appropriate size.  A trial reduction was done and the aforementioned head and neck assembly gave us  the best stability in extension  with external rotation.  Leg lengths were felt to be about equal by fluoroscopic exam.  The trial components were removed and the wound irrigated.  We then placed the femoral component in appropriate anteversion.  The head was applied to a dry stem neck and the hip again reduced.  It was again stable in the aforementioned position.  The would was irrigated again followed by  re-approximation of anterior capsule with ethibond suture. Tensor fascia was repaired with V-loc suture  followed by deep closure with #O and #2 undyed vicryl.  Skin was closed with subQ stitch and steristrips followed by a sterile dressing.  EBL and IOF can be obtained from anesthesia records.  DISPOSITION:  The patient was taken to PACU to potentially go home same day depending on ability to walk and tolerate liquids.

## 2024-03-19 NOTE — Evaluation (Signed)
 Physical Therapy Evaluation Patient Details Name: Robin Christensen MRN: [id number] DOB: 1950-07-25 Today's Date: 03/19/2024  History of Present Illness  73 yo female presents to therapy s/p L THA, anterior approach on 03/18/2024 due to failure of conservative measures. Pt PMH includes but is not limited to: hypothyroidism, OSA, HTN, HLD, depression, B LE edema, OA, hardening of arota, LBP, R ba Ca, A-fib, GERD, and B TKA.  Clinical Impression  Pt is s/p THA resulting in the deficits listed below (see PT Problem List).  Majority of session spent addressing toileting needs as pt did not have foley catheter and declined to attempt with nursing staff; sat EOB with goal of BSC as pt needed to void. pt with c/o dizziness sitting EOB, worsened with pt stating she felt as if she would have LOC; assisted back to supine. pt then able to roll R for bed pan; sheets became soaked with urine, assisted RN with full bed linen change--pt able to roll R/L with cues to self assist;  BP stable at 128/65 in supine, HR 60, SpO2=96% on RA --anticipate dizziness likely d/t copious amounts of pain medicine received post surgery, pt states pain is 9/10 EOS and RN states that is an improvement from earlier.    Pt will benefit from acute skilled PT to increase their independence and safety with mobility to facilitate discharge.          If plan is discharge home, recommend the following: A lot of help with bathing/dressing/bathroom;A lot of help with walking and/or transfers;Help with stairs or ramp for entrance;Assist for transportation;Assistance with cooking/housework   Can travel by private vehicle        Equipment Recommendations None recommended by PT  Recommendations for Other Services       Functional Status Assessment Patient has had a recent decline in their functional status and demonstrates the ability to make significant improvements in function in a reasonable and predictable amount of time.      Precautions / Restrictions Precautions Precautions: Fall Restrictions Weight Bearing Restrictions Per Provider Order: No Other Position/Activity Restrictions: WBAT      Mobility  Bed Mobility Overal bed mobility: Needs Assistance Bed Mobility: Supine to Sit, Sit to Supine, Rolling Rolling: Min assist   Supine to sit: Min assist Sit to supine: Mod assist, +2 for safety/equipment   General bed mobility comments: sat EOB with goal of BSC as pt needed to void. pt with c/o dizziness sitting EOB, worsened with pt stating she felt as if she would have LOC; assisted back to supine. pt then able to roll R for bed pan; sheets became soaked with urine, assisted RN with full bed linen change--pt able to roll R/L with cues to self assist; BP stable at 128/65 insupine, HR 60, SpO2=96% on RA    Transfers                   General transfer comment: deferred d/t severe dizziness in sitting-likely d/t multiple pain meds given    Ambulation/Gait               General Gait Details: unable  Stairs            Wheelchair Mobility     Tilt Bed    Modified Rankin (Stroke Patients Only)       Balance Overall balance assessment: Needs assistance Sitting-balance support: Feet supported, No upper extremity supported Sitting balance-Leahy Scale: Fair Sitting balance - Comments: unchalleged d/t dizziness  Standing balance comment: NT                             Pertinent Vitals/Pain Pain Assessment Pain Assessment: 0-10 Pain Score: 9  Pain Location: L hip Pain Descriptors / Indicators: Aching, Discomfort, Sore, Guarding, Grimacing Pain Intervention(s): Limited activity within patient's tolerance, Monitored during session, Premedicated before session, RN gave pain meds during session, Repositioned (had all allowable meds per RN/heavily medicated in PACU)    Home Living Family/patient expects to be discharged to:: Private residence Living  Arrangements: Alone Available Help at Discharge: Family Type of Home: House Home Access: Stairs to enter Entrance Stairs-Rails: None Entrance Stairs-Number of Steps: 1   Home Layout: Two level;Able to live on main level with bedroom/bathroom Home Equipment: Rolling Walker (2 wheels);Cane - single point Additional Comments: hurrycane-SPC; sister sharlet will be assisting at home    Prior Function Prior Level of Function : Independent/Modified Independent             Mobility Comments: ind ADLs Comments: ind     Extremity/Trunk Assessment   Upper Extremity Assessment Upper Extremity Assessment: Overall WFL for tasks assessed    Lower Extremity Assessment Lower Extremity Assessment: LLE deficits/detail LLE Deficits / Details: ankle WFL< knee and hip at least 2+/5, MMT limited by pain LLE: Unable to fully assess due to pain       Communication   Communication Communication: No apparent difficulties    Cognition Arousal: Alert Behavior During Therapy: WFL for tasks assessed/performed   PT - Cognitive impairments: No apparent impairments                         Following commands: Intact       Cueing Cueing Techniques: Verbal cues, Gestural cues     General Comments      Exercises Total Joint Exercises Ankle Circles/Pumps: AROM, Both, 5 reps   Assessment/Plan    PT Assessment Patient needs continued PT services  PT Problem List Decreased strength;Decreased mobility;Decreased activity tolerance;Decreased balance;Decreased knowledge of use of DME;Decreased range of motion;Pain       PT Treatment Interventions DME instruction;Therapeutic exercise;Gait training;Functional mobility training;Therapeutic activities;Patient/family education;Stair training    PT Goals (Current goals can be found in the Care Plan section)  Acute Rehab PT Goals PT Goal Formulation: With patient Time For Goal Achievement: 03/26/24 Potential to Achieve Goals: Good     Frequency 7X/week     Co-evaluation               AM-PAC PT 6 Clicks Mobility  Outcome Measure Help needed turning from your back to your side while in a flat bed without using bedrails?: A Little Help needed moving from lying on your back to sitting on the side of a flat bed without using bedrails?: A Little Help needed moving to and from a bed to a chair (including a wheelchair)?: Total Help needed standing up from a chair using your arms (e.g., wheelchair or bedside chair)?: Total Help needed to walk in hospital room?: Total Help needed climbing 3-5 steps with a railing? : Total 6 Click Score: 10    End of Session   Activity Tolerance: Treatment limited secondary to medical complications (Comment);Patient limited by pain (dizziness/pain) Patient left: in bed;with call bell/phone within reach;with bed alarm set;with family/visitor present Nurse Communication: Mobility status PT Visit Diagnosis: Other abnormalities of gait and mobility (R26.89)    Time:  8561-8493 PT Time Calculation (min) (ACUTE ONLY): 28 min   Charges:   PT Evaluation $PT Eval Low Complexity: 1 Low PT Treatments $Therapeutic Activity: 8-22 mins PT General Charges $$ ACUTE PT VISIT: 1 Visit         Malyia Moro, PT  Acute Rehab Dept [facility]) [phone]  03/19/2024   [facility] 03/19/2024, 3:59 PM

## 2024-03-19 NOTE — Anesthesia Procedure Notes (Signed)
 Procedure Name: Intubation Date/Time: 03/19/2024 8:00 AM  Performed by: Cindie Charleen PARAS, CRNAPre-anesthesia Checklist: Patient identified, Emergency Drugs available, Suction available, Patient being monitored and Timeout performed Patient Re-evaluated:Patient Re-evaluated prior to induction Oxygen Delivery Method: Circle system utilized Preoxygenation: Pre-oxygenation with 100% oxygen Induction Type: IV induction Ventilation: Mask ventilation without difficulty Laryngoscope Size: Mac and 3 Grade View: Grade II Tube type: Oral Tube size: 7.0 mm Number of attempts: 1 Airway Equipment and Method: Stylet Placement Confirmation: ETT inserted through vocal cords under direct vision, positive ETCO2 and breath sounds checked- equal and bilateral Secured at: 21 cm Tube secured with: Tape Dental Injury: Teeth and Oropharynx as per pre-operative assessment

## 2024-03-19 NOTE — Progress Notes (Signed)
   03/19/24 2205  BiPAP/CPAP/SIPAP  $ Non-Invasive Home Ventilator  Initial  BiPAP/CPAP/SIPAP Pt Type Adult  BiPAP/CPAP/SIPAP Resmed  Mask Type Nasal pillows  Dentures removed? Not applicable  Respiratory Rate 16 breaths/min  FiO2 (%) 21 %  Patient Home Machine No  Patient Home Mask Yes  Patient Home Tubing Yes  Auto Titrate Yes  Minimum cmH2O 5 cmH2O  Maximum cmH2O 20 cmH2O  Device Plugged into RED Power Outlet Yes

## 2024-03-19 NOTE — Transfer of Care (Signed)
 Immediate Anesthesia Transfer of Care Note  Patient: Robin Christensen  Procedure(s) Performed: ARTHROPLASTY, HIP, TOTAL, ANTERIOR APPROACH (Left: Hip)  Patient Location: PACU  Anesthesia Type:General  Level of Consciousness: sedated, patient cooperative, and responds to stimulation  Airway & Oxygen Therapy: Patient Spontanous Breathing and Patient connected to face mask oxygen  Post-op Assessment: Report given to RN and Post -op Vital signs reviewed and stable  Post vital signs: Reviewed and stable  Last Vitals:  Vitals Value Taken Time  BP 158/68 03/19/24 09:51  Temp 36.5 C 03/19/24 09:51  Pulse 71 03/19/24 09:53  Resp 15 03/19/24 09:53  SpO2 100 % 03/19/24 09:53  Vitals shown include unfiled device data.  Last Pain:  Vitals:   03/19/24 0951  TempSrc:   PainSc: 9          Complications: No notable events documented.

## 2024-03-19 NOTE — Anesthesia Procedure Notes (Signed)
 Spinal  Patient location during procedure: OR Start time: 03/19/2024 7:38 AM End time: 03/19/2024 7:48 AM Reason for block: surgical anesthesia Staffing Performed: anesthesiologist  Anesthesiologist: Maryclare Cornet, MD Performed by: Maryclare Cornet, MD Authorized by: Maryclare Cornet, MD   Preanesthetic Checklist Completed: patient identified, IV checked, risks and benefits discussed, surgical consent, monitors and equipment checked, pre-op evaluation and timeout performed Spinal Block Patient position: sitting Prep: DuraPrep Patient monitoring: cardiac monitor, continuous pulse ox and blood pressure Approach: midline Location: L3-4 Injection technique: single-shot Needle Needle type: Pencan  Needle gauge: 24 G Needle length: 9 cm Assessment Events: failed spinal Additional Notes Functioning IV was confirmed and monitors were applied. Sterile prep and drape, including hand hygiene and sterile gloves were used. The patient was positioned and the spine was prepped. The skin was anesthetized with lidocaine .  Multiple attempts were made at L3/4 and L2/3. Unable to locate subarachnoid space. The patient tolerated the procedure well.

## 2024-03-19 NOTE — Interval H&P Note (Signed)
 History and Physical Interval Note:  03/19/2024 7:28 AM  Robin Christensen  has presented today for surgery, with the diagnosis of LEFT HIP DEGENERATIVE JOINT DISEASE.  The various methods of treatment have been discussed with the patient and family. After consideration of risks, benefits and other options for treatment, the patient has consented to  Procedure(s): ARTHROPLASTY, HIP, TOTAL, ANTERIOR APPROACH (Left) as a surgical intervention.  The patient's history has been reviewed, patient examined, no change in status, stable for surgery.  I have reviewed the patient's chart and labs.  Questions were answered to the patient's satisfaction.     Robin Christensen

## 2024-03-19 NOTE — Anesthesia Postprocedure Evaluation (Signed)
 Anesthesia Post Note  Patient: Robin Christensen  Procedure(s) Performed: ARTHROPLASTY, HIP, TOTAL, ANTERIOR APPROACH (Left: Hip)     Patient location during evaluation: PACU Anesthesia Type: General Level of consciousness: awake and alert Pain management: pain level controlled Vital Signs Assessment: post-procedure vital signs reviewed and stable Respiratory status: spontaneous breathing, nonlabored ventilation, respiratory function stable and patient connected to nasal cannula oxygen Cardiovascular status: blood pressure returned to baseline and stable Postop Assessment: no apparent nausea or vomiting Anesthetic complications: no   No notable events documented.  Last Vitals:  Vitals:   03/19/24 1145 03/19/24 1219  BP: (!) 134/58 (!) 127/58  Pulse: (!) 58 (!) 56  Resp: 14 14  Temp: (!) 36.1 C 36.4 C  SpO2: 96% 96%    Last Pain:  Vitals:   03/19/24 1219  TempSrc: Oral  PainSc:                  Jahni Nazar S

## 2024-03-19 NOTE — Plan of Care (Signed)

## 2024-03-20 ENCOUNTER — Ambulatory Visit: Payer: Medicare (Managed Care)

## 2024-03-20 ENCOUNTER — Encounter (HOSPITAL_COMMUNITY): Payer: Self-pay | Admitting: Orthopaedic Surgery

## 2024-03-20 DIAGNOSIS — K219 Gastro-esophageal reflux disease without esophagitis: Secondary | ICD-10-CM | POA: Diagnosis present

## 2024-03-20 DIAGNOSIS — I10 Essential (primary) hypertension: Secondary | ICD-10-CM | POA: Diagnosis present

## 2024-03-20 DIAGNOSIS — Z9842 Cataract extraction status, left eye: Secondary | ICD-10-CM | POA: Diagnosis not present

## 2024-03-20 DIAGNOSIS — Z79899 Other long term (current) drug therapy: Secondary | ICD-10-CM | POA: Diagnosis not present

## 2024-03-20 DIAGNOSIS — F32A Depression, unspecified: Secondary | ICD-10-CM | POA: Diagnosis present

## 2024-03-20 DIAGNOSIS — R519 Headache, unspecified: Secondary | ICD-10-CM | POA: Diagnosis present

## 2024-03-20 DIAGNOSIS — Z9841 Cataract extraction status, right eye: Secondary | ICD-10-CM | POA: Diagnosis not present

## 2024-03-20 DIAGNOSIS — Z79890 Hormone replacement therapy (postmenopausal): Secondary | ICD-10-CM | POA: Diagnosis not present

## 2024-03-20 DIAGNOSIS — D6859 Other primary thrombophilia: Secondary | ICD-10-CM | POA: Diagnosis present

## 2024-03-20 DIAGNOSIS — Z853 Personal history of malignant neoplasm of breast: Secondary | ICD-10-CM | POA: Diagnosis not present

## 2024-03-20 DIAGNOSIS — Z8701 Personal history of pneumonia (recurrent): Secondary | ICD-10-CM | POA: Diagnosis not present

## 2024-03-20 DIAGNOSIS — G4733 Obstructive sleep apnea (adult) (pediatric): Secondary | ICD-10-CM | POA: Diagnosis present

## 2024-03-20 DIAGNOSIS — Z6837 Body mass index (BMI) 37.0-37.9, adult: Secondary | ICD-10-CM | POA: Diagnosis not present

## 2024-03-20 DIAGNOSIS — Z7901 Long term (current) use of anticoagulants: Secondary | ICD-10-CM | POA: Diagnosis not present

## 2024-03-20 DIAGNOSIS — E538 Deficiency of other specified B group vitamins: Secondary | ICD-10-CM | POA: Diagnosis present

## 2024-03-20 DIAGNOSIS — Z818 Family history of other mental and behavioral disorders: Secondary | ICD-10-CM | POA: Diagnosis not present

## 2024-03-20 DIAGNOSIS — M25552 Pain in left hip: Secondary | ICD-10-CM | POA: Diagnosis present

## 2024-03-20 DIAGNOSIS — M1612 Unilateral primary osteoarthritis, left hip: Secondary | ICD-10-CM | POA: Diagnosis present

## 2024-03-20 DIAGNOSIS — E559 Vitamin D deficiency, unspecified: Secondary | ICD-10-CM | POA: Diagnosis present

## 2024-03-20 DIAGNOSIS — Z96653 Presence of artificial knee joint, bilateral: Secondary | ICD-10-CM | POA: Diagnosis present

## 2024-03-20 DIAGNOSIS — Z8249 Family history of ischemic heart disease and other diseases of the circulatory system: Secondary | ICD-10-CM | POA: Diagnosis not present

## 2024-03-20 DIAGNOSIS — E782 Mixed hyperlipidemia: Secondary | ICD-10-CM | POA: Diagnosis present

## 2024-03-20 DIAGNOSIS — Z9049 Acquired absence of other specified parts of digestive tract: Secondary | ICD-10-CM | POA: Diagnosis not present

## 2024-03-20 DIAGNOSIS — E66812 Obesity, class 2: Secondary | ICD-10-CM | POA: Diagnosis present

## 2024-03-20 DIAGNOSIS — E039 Hypothyroidism, unspecified: Secondary | ICD-10-CM | POA: Diagnosis present

## 2024-03-20 LAB — CBC
HCT: 25.6 % — ABNORMAL LOW (ref 36.0–46.0)
Hemoglobin: 8.2 g/dL — ABNORMAL LOW (ref 12.0–15.0)
MCH: 30.7 pg (ref 26.0–34.0)
MCHC: 32 g/dL (ref 30.0–36.0)
MCV: 95.9 fL (ref 80.0–100.0)
Platelets: 142 K/uL — ABNORMAL LOW (ref 150–400)
RBC: 2.67 MIL/uL — ABNORMAL LOW (ref 3.87–5.11)
RDW: 13.9 % (ref 11.5–15.5)
WBC: 11 K/uL — ABNORMAL HIGH (ref 4.0–10.5)
nRBC: 0 % (ref 0.0–0.2)

## 2024-03-20 LAB — BASIC METABOLIC PANEL WITH GFR
Anion gap: 10 (ref 5–15)
BUN: 17 mg/dL (ref 8–23)
CO2: 26 mmol/L (ref 22–32)
Calcium: 8.6 mg/dL — ABNORMAL LOW (ref 8.9–10.3)
Chloride: 101 mmol/L (ref 98–111)
Creatinine, Ser: 0.85 mg/dL (ref 0.44–1.00)
GFR, Estimated: >60 mL/min (ref >60)
Glucose, Bld: 136 mg/dL — ABNORMAL HIGH (ref 70–99)
Potassium: 4.8 mmol/L (ref 3.5–5.1)
Sodium: 136 mmol/L (ref 135–145)

## 2024-03-20 LAB — PREPARE RBC (CROSSMATCH)

## 2024-03-20 MED ORDER — SODIUM CHLORIDE 0.9% IV SOLUTION: Freq: Once | INTRAVENOUS | Status: AC

## 2024-03-20 NOTE — Care Management Obs Status (Signed)
 MEDICARE OBSERVATION STATUS NOTIFICATION   Patient Details  Name: Robin Christensen MRN: [id number] Date of Birth: 20-Jul-1950   Medicare Observation Status Notification Given:  [name], LCSW 03/20/2024, 12:18 PM

## 2024-03-20 NOTE — TOC Transition Note (Signed)
 Transition of Care [facility]) - Discharge Note   Patient Details  Name: Robin Christensen MRN: [id number] Date of Birth: [birth year]  Transition of [facility]) CM/SW Contact:  NORMAN ASPEN, LCSW Phone Number: 03/20/2024, 10:46 AM   Clinical Narrative:     Met with pt who confirms she has needed DME in the home.  OPPT already arranged with [facility] ([place]).  No further IP CM needs.  Final next level of care: OP Rehab Barriers to Discharge: No Barriers Identified   Patient Goals and CMS Choice Patient states their goals for this hospitalization and ongoing recovery are:: return home          Discharge Placement                       Discharge Plan and Services Additional resources added to the After Visit Summary for                  DME Arranged: N/A DME Agency: NA                  Social Drivers of Health (SDOH) Interventions SDOH Screenings   Food Insecurity: Patient Declined (03/19/2024)  Housing: Unknown (03/19/2024)  Transportation Needs: No Transportation Needs (03/19/2024)  Utilities: Not At Risk (03/19/2024)  Depression (PHQ2-9): Medium Risk (07/25/2019)  Social Connections: Patient Declined (03/19/2024)  Tobacco Use: Low Risk  (03/19/2024)     Readmission Risk Interventions     No data to display

## 2024-03-20 NOTE — Progress Notes (Signed)
 Physical Therapy Treatment Patient Details Name: Robin Christensen MRN: [id number] DOB: 02/01/1951 Today's Date: 03/20/2024   History of Present Illness 73 yo female presents to therapy s/p L THA, anterior approach on 03/18/2024 due to failure of conservative measures. Pt PMH includes but is not limited to: hypothyroidism, OSA, HTN, HLD, depression, B LE edema, OA, hardening of arota, LBP, R ba Ca, A-fib, GERD, and B TKA.    PT Comments  Pt initially reports feeling much better today at arrival. However, with transfers pt becoming lightheaded and was limited by orthostatic hypotension (see comments).  She did move much better and pain controlled but only able to get to chair due to BP.  Pt likely to mobilize well once orthostatic hypotension improves.     If plan is discharge home, recommend the following: Help with stairs or ramp for entrance;Assist for transportation;Assistance with cooking/housework;A little help with walking and/or transfers;A little help with bathing/dressing/bathroom   Can travel by private vehicle        Equipment Recommendations  None recommended by PT    Recommendations for Other Services       Precautions / Restrictions Precautions Precautions: Fall Restrictions Other Position/Activity Restrictions: WBAT     Mobility  Bed Mobility Overal bed mobility: Needs Assistance Bed Mobility: Supine to Sit     Supine to sit: Contact guard, Used rails     General bed mobility comments: CGA for safety but no physical assist needed    Transfers Overall transfer level: Needs assistance Equipment used: Rolling walker (2 wheels) Transfers: Sit to/from Stand, Bed to chair/wheelchair/BSC Sit to Stand: Contact guard assist Stand pivot transfers: Contact guard assist         General transfer comment: CGA and cues for safety; only tolerating tx to chair due to orthostatic hypotension; see comments    Ambulation/Gait                   Stairs              Wheelchair Mobility     Tilt Bed    Modified Rankin (Stroke Patients Only)       Balance Overall balance assessment: Needs assistance Sitting-balance support: Feet supported, No upper extremity supported Sitting balance-Leahy Scale: Good     Standing balance support: Bilateral upper extremity supported, Reliant on assistive device for balance Standing balance-Leahy Scale: Poor Standing balance comment: steady with RW                            Communication    Cognition Arousal: Alert Behavior During Therapy: WFL for tasks assessed/performed   PT - Cognitive impairments: No apparent impairments                                Cueing    Exercises Total Joint Exercises Ankle Circles/Pumps: AROM, 20 reps, Both, Seated Heel Slides: AAROM, Left, 10 reps, Supine Long Arc Quad: AROM, Both, 20 reps, Seated Other Exercises Other Exercises: UE shoulder press AROM x 20 -attempt to improve orthostatic hypotension    General Comments General comments (skin integrity, edema, etc.): Pt reports feeling much better upon PT arrival.  BP prior to session was 105/65.  Upon sitting pt reports some lightheadedness and nausea. BP was 103/47.  Sat EOB at least 10 mins and performed exercises. BP remained at 99/48 [facility]). Pt motivated , reports feeling slightly  better, and wants to try chair.  CHair close for pivot.  Increased symptoms in standing so quickly pivoted to chair and sat.  BP down to 71/35 (MAP46) and pt pale. Notifiied nursing via call bell. Pt reclined in chair and up to 100/47 in 2 mins and 100/52 in 5 mins.  Pt feeling better and color improved.  Sat back of chair up and BP remained stable at 3 mins.  Nurse not present - found nurse after session, notified of BP and symptoms, potential need for fluids if MD feels appropriate      Pertinent Vitals/Pain Pain Assessment Pain Assessment: 0-10 Pain Score: 3  Pain Location: L hip Pain Descriptors /  Indicators: Discomfort Pain Intervention(s): Limited activity within patient's tolerance, Monitored during session, Premedicated before session, Repositioned, Ice applied (pain is tolerable)    Home Living                          Prior Function            PT Goals (current goals can now be found in the care plan section) Progress towards PT goals: Progressing toward goals    Frequency    7X/week      PT Plan      Co-evaluation              AM-PAC PT 6 Clicks Mobility   Outcome Measure  Help needed turning from your back to your side while in a flat bed without using bedrails?: A Little Help needed moving from lying on your back to sitting on the side of a flat bed without using bedrails?: A Little Help needed moving to and from a bed to a chair (including a wheelchair)?: A Little Help needed standing up from a chair using your arms (e.g., wheelchair or bedside chair)?: A Little Help needed to walk in hospital room?: Total Help needed climbing 3-5 steps with a railing? : Total 6 Click Score: 14    End of Session Equipment Utilized During Treatment: Gait belt Activity Tolerance: Treatment limited secondary to medical complications (Comment) Patient left: with call bell/phone within reach;with family/visitor present;in chair Nurse Communication: Mobility status (orthostatic hypotension) PT Visit Diagnosis: Other abnormalities of gait and mobility (R26.89);Muscle weakness (generalized) (M62.81)     Time: 8877-8841 PT Time Calculation (min) (ACUTE ONLY): 36 min  Charges:    $Therapeutic Exercise: 8-22 mins $Therapeutic Activity: 8-22 mins PT General Charges $$ ACUTE PT VISIT: 1 Visit                     Benjiman, PT Acute Rehab [facility] [phone]    Benjiman [name] 03/20/2024, 12:13 PM

## 2024-03-20 NOTE — Progress Notes (Signed)
 Subjective: 1 Day Post-Op Procedure(s) (LRB): ARTHROPLASTY, HIP, TOTAL, ANTERIOR APPROACH (Left)  Patient doing well. She is hoping to go home today.   Activity level:  wbat Diet tolerance:  ok Voiding:  ok Patient reports pain as mild.    Objective: Vital signs in last 24 hours: Temp:  [97 F (36.1 C)-98.1 F (36.7 C)] 97.9 F (36.6 C) (09/03 0523) Pulse Rate:  [56-81] 64 (09/03 0523) Resp:  [10-18] 18 (09/03 0523) BP: (105-158)/(56-111) 105/65 (09/03 0523) SpO2:  [89 %-100 %] 93 % (09/03 0523) FiO2 (%):  [21 %] 21 % (09/02 2205)  Labs: Recent Labs    03/19/24 1038 03/20/24 0329  HGB 10.1* 8.2*   Recent Labs    03/19/24 1038 03/20/24 0329  WBC  --  11.0*  RBC  --  2.67*  HCT 32.5* 25.6*  PLT  --  142*   Recent Labs    03/20/24 0329  NA 136  K 4.8  CL 101  CO2 26  BUN 17  CREATININE 0.85  GLUCOSE 136*  CALCIUM  8.6*   No results for input(s): LABPT, INR in the last 72 hours.  Physical Exam:  Neurologically intact ABD soft Neurovascular intact Sensation intact distally Intact pulses distally Dorsiflexion/Plantar flexion intact Incision: dressing C/D/I and no drainage No cellulitis present Compartment soft  Assessment/Plan:  1 Day Post-Op Procedure(s) (LRB): ARTHROPLASTY, HIP, TOTAL, ANTERIOR APPROACH (Left) Advance diet Up with therapy D/C IV fluids D/c home today if cleared by PT and doing well. Hgb is 8.2 this morning but patient is feeling well. If she starts to have any light headed ness or dizziness with PT then we may transfuse her but she would like to see how she does. I will continue to monitor closely. If she does well and is asymptomatic then she will d/c home today.    Prentice [place] Bump 03/20/2024, 8:32 AM

## 2024-03-20 NOTE — Discharge Summary (Addendum)
 Patient ID: Robin Christensen MRN: [id number] DOB/AGE: 10/19/1950 73 y.o.  Admit date: 03/19/2024 Discharge date: 03/22/2024  Admission Diagnoses:  Principal Problem:   Primary localized osteoarthritis of left hip Active Problems:   S/P total left hip arthroplasty   Discharge Diagnoses:  Same  Past Medical History:  Diagnosis Date   A-fib (HCC)    Arthritis    B12 deficiency    Back pain    Breast cancer (HCC)    Cancer (HCC)    Right Breast Cancer   Chronic headaches    Dysrhythmia    AF   Edema, lower extremity    Gallbladder disease    GERD (gastroesophageal reflux disease)    Hyperlipidemia    Hypertension    Hypothyroidism    Joint pain    Obesity    Pneumonia    PONV (postoperative nausea and vomiting)    Sleep apnea    cpcap   Vitamin deficiency     Surgeries: Procedure(s): ARTHROPLASTY, HIP, TOTAL, ANTERIOR APPROACH on 03/19/2024   Consultants:   Discharged Condition: Improved  Hospital Course: Robin Christensen is an 73 y.o. female who was admitted 03/19/2024 for operative treatment ofPrimary localized osteoarthritis of left hip. Patient has severe unremitting pain that affects sleep, daily activities, and work/hobbies. After pre-op clearance the patient was taken to the operating room on 03/19/2024 and underwent  Procedure(s): ARTHROPLASTY, HIP, TOTAL, ANTERIOR APPROACH.    Patient was given perioperative antibiotics:  Anti-infectives (From admission, onward)    Start     Dose/Rate Route Frequency Ordered Stop   03/19/24 1400  ceFAZolin  (ANCEF ) IVPB 2g/100 mL premix        2 g 200 mL/hr over 30 Minutes Intravenous Every 6 hours 03/19/24 1203 03/19/24 2034   03/19/24 0825  ceFAZolin  (ANCEF ) 2-4 GM/100ML-% IVPB       Note to Pharmacy: Kateri Raisin K: cabinet override      03/19/24 0825 03/19/24 0836   03/19/24 0600  ceFAZolin  (ANCEF ) IVPB 2g/100 mL premix        2 g 200 mL/hr over 30 Minutes Intravenous On call to O.R. 03/19/24 0531 03/19/24 0809         Patient was given sequential compression devices, early ambulation, and chemoprophylaxis to prevent DVT.  Patient had a Hgb of 8.2 post op day one. She received 2 units of blood and felt much better after that.   Patient benefited maximally from hospital stay and there were no complications.    Recent vital signs: Patient Vitals for the past 24 hrs:  BP Temp Temp src Pulse Resp SpO2  03/20/24 0523 105/65 97.9 F (36.6 C) Oral 64 18 93 %  03/20/24 0130 106/62 98 F (36.7 C) Oral 63 18 94 %  03/19/24 1756 (!) 107/57 98.1 F (36.7 C) Oral 61 17 92 %  03/19/24 1452 (!) 128/57 -- -- 64 16 97 %  03/19/24 1415 119/62 -- -- (!) 59 17 100 %  03/19/24 1219 (!) 127/58 97.6 F (36.4 C) Oral (!) 56 14 96 %  03/19/24 1145 (!) 134/58 (!) 97 F (36.1 C) -- (!) 58 14 96 %  03/19/24 1130 138/60 -- -- (!) 58 10 95 %  03/19/24 1115 (!) 131/56 -- -- 64 12 (!) 89 %  03/19/24 1100 (!) 139/56 -- -- 60 11 98 %  03/19/24 1045 (!) 153/63 -- -- 65 17 93 %  03/19/24 1030 (!) 137/111 -- -- 63 11 97 %  03/19/24 1015 135/67 -- --  62 12 97 %  03/19/24 1008 -- -- -- 70 15 94 %  03/19/24 1000 (!) 141/87 -- -- 62 18 100 %  03/19/24 0951 (!) 158/68 97.7 F (36.5 C) -- 81 13 100 %     Recent laboratory studies:  Recent Labs    03/19/24 1038 03/20/24 0329  WBC  --  11.0*  HGB 10.1* 8.2*  HCT 32.5* 25.6*  PLT  --  142*  NA  --  136  K  --  4.8  CL  --  101  CO2  --  26  BUN  --  17  CREATININE  --  0.85  GLUCOSE  --  136*  CALCIUM   --  8.6*     Discharge Medications:   Allergies as of 03/20/2024       Reactions   Rivaroxaban  Other (See Comments)   JOINT ACHES/PAIN        Medication List     TAKE these medications    acetaminophen  650 MG CR tablet Commonly known as: TYLENOL  Take 1,300 mg by mouth every 8 (eight) hours.   apixaban  5 MG Tabs tablet Commonly known as: Eliquis  Take 1 tablet (5 mg total) by mouth 2 (two) times daily.   baclofen  10 MG tablet Commonly known as:  LIORESAL  Take 1/2 tablet to 1 tablet up to three times daily as needed for headache.   Biotin 5000 MCG Caps Take 10,000 mcg by mouth daily.   CALCIUM  600 + D PO Take 2 tablets by mouth daily.   cholecalciferol 25 MCG (1000 UNIT) tablet Commonly known as: VITAMIN D3 Take 1,000 Units by mouth daily.   cyanocobalamin  1000 MCG tablet Commonly known as: VITAMIN B12 Take 1,000 mcg by mouth daily.   furosemide  40 MG tablet Commonly known as: LASIX  Take 1 tablet (40 mg total) by mouth daily.   gabapentin  300 MG capsule Commonly known as: NEURONTIN  Take 300-600 mg by mouth See admin instructions. 300 mg  in the morning, 300 mg  in afternoon, and 600 mg at bedtime   HYDROcodone -acetaminophen  5-325 MG tablet Commonly known as: NORCO/VICODIN Take 1-2 tablets by mouth every 6 (six) hours as needed for moderate pain (pain score 4-6) or severe pain (pain score 7-10) (post op pain max 6 pills per day.).   levothyroxine  75 MCG tablet Commonly known as: SYNTHROID  Take 75 mcg by mouth daily before breakfast.   Magnesium  250 MG Tabs Take 500 mg by mouth daily. Citrate   oxybutynin  10 MG 24 hr tablet Commonly known as: DITROPAN -XL Take 10 mg by mouth daily.   promethazine  12.5 MG tablet Commonly known as: PHENERGAN  Take 1-2 tablets (12.5-25 mg total) by mouth every 6 (six) hours as needed for nausea or vomiting.   rosuvastatin  10 MG tablet Commonly known as: CRESTOR  Take 1 tablet (10 mg total) by mouth daily.   sertraline  50 MG tablet Commonly known as: Zoloft  Take 1 tablet (50 mg total) by mouth daily.   tiZANidine  4 MG tablet Commonly known as: Zanaflex  Take 1 tablet (4 mg total) by mouth every 6 (six) hours as needed for muscle spasms.   zonisamide  25 MG capsule Commonly known as: ZONEGRAN  Take 3 capsules (75 mg total) by mouth daily.               Durable Medical Equipment  (From admission, onward)           Start     Ordered   03/19/24 1204  DME Vannie  rolling  Once       Question:  Patient needs a walker to treat with the following condition  Answer:  Primary osteoarthritis of left hip   03/19/24 1203   03/19/24 1204  DME 3 n 1  Once        03/19/24 1203   03/19/24 1204  DME Bedside commode  Once       Question:  Patient needs a bedside commode to treat with the following condition  Answer:  Primary osteoarthritis of left hip   03/19/24 1203            Diagnostic Studies: DG HIP UNILAT WITH PELVIS 1V LEFT Result Date: 03/19/2024 CLINICAL DATA:  Elective surgery. EXAM: DG HIP (WITH OR WITHOUT PELVIS) 1V*L* COMPARISON:  Preoperative imaging FINDINGS: Fourteen fluoroscopic spot views of the pelvis and left hip obtained in the operating room. Sequential images during hip arthroplasty. Fluoroscopy time 23 seconds. Dose 4.05 mGy. IMPRESSION: Intraoperative fluoroscopy during left hip arthroplasty. Electronically Signed   By: Andrea Gasman M.D.   On: 03/19/2024 11:23   DG C-Arm 1-60 Min-No Report Result Date: 03/19/2024 Fluoroscopy was utilized by the requesting physician.  No radiographic interpretation.   DG C-Arm 1-60 Min-No Report Result Date: 03/19/2024 Fluoroscopy was utilized by the requesting physician.  No radiographic interpretation.   MR BRAIN WO CONTRAST Result Date: 03/14/2024 EXAM: MRI BRAIN WITHOUT CONTRAST 03/01/2024 07:49:23 AM TECHNIQUE: Multiplanar multisequence MRI of the head/brain was performed without the administration of intravenous contrast. COMPARISON: 05/29/2023 CLINICAL HISTORY: Headache, increasing frequency or severity; Headache, new onset (Age >= 51y). Pt with headaches with new sudden vision loss for years but getting worse; Hx of breast cancer in 1992; No surgery, no injury. FINDINGS: BRAIN AND VENTRICLES: No acute infarct. No intracranial hemorrhage. No mass. No midline shift. No hydrocephalus. The sella is unremarkable. Normal flow voids. Multifocal hyperintense T2-weighted signal within the cerebral white  matter, most commonly due to chronic small vessel disease. ORBITS: No acute abnormality. SINUSES AND MASTOIDS: No acute abnormality. BONES AND SOFT TISSUES: Normal marrow signal. No acute soft tissue abnormality. IMPRESSION: 1. No acute intracranial abnormality. 2. Unchanged multifocal hyperintense T2-weighted signal within the cerebral white matter, most commonly due to chronic small vessel disease. Electronically signed by: Franky Stanford MD 03/14/2024 12:33 PM EDT RP Workstation: [id number]   MR ANGIO HEAD WO CONTRAST Result Date: 03/14/2024 EXAM: MR Angiography Head without intravenous Contrast. 03/01/2024 07:42:32 AM TECHNIQUE: Magnetic resonance angiography images of the head without intravenous contrast. Multiplanar 2D and 3D reformatted images are provided for review. COMPARISON: None provided. CLINICAL HISTORY: Headache, new onset (Age >= 51y). FINDINGS: ANTERIOR CIRCULATION: No significant stenosis of the internal carotid arteries. No significant stenosis of the anterior cerebral arteries. No significant stenosis of the middle cerebral arteries. No aneurysm. POSTERIOR CIRCULATION: No significant stenosis of the posterior cerebral arteries. No significant stenosis of the basilar artery. No significant stenosis of the vertebral arteries. No aneurysm. IMPRESSION: 1. No significant stenosis of the intracranial vasculature. Electronically signed by: Franky Stanford MD 03/14/2024 12:30 PM EDT RP Workstation: [id number]   DG Chest 2 View Result Date: 03/08/2024 CLINICAL DATA:  Preop chest exam. Per electronic records, upcoming total hip arthroplasty. EXAM: CHEST - 2 VIEW COMPARISON:  Radiograph 08/07/2018 FINDINGS: Stable heart size and mediastinal contours. The descending aorta is tortuous. No pulmonary edema. No focal airspace disease, pleural effusion, or pneumothorax. Multiple surgical clips project over the right chest wall and left upper abdomen. No acute osseous abnormalities. IMPRESSION: No acute  chest  findings. Electronically Signed   By: Andrea Gasman M.D.   On: 03/08/2024 15:31    Disposition: Discharge disposition: 01-Home or Self Care       Discharge Instructions     Call MD / Call 911   Complete by: As directed    If you experience chest pain or shortness of breath, CALL 911 and be transported to the hospital emergency room.  If you develope a fever above 101 F, pus (white drainage) or increased drainage or redness at the wound, or calf pain, call your surgeon's office.   Call MD / Call 911   Complete by: As directed    If you experience chest pain or shortness of breath, CALL 911 and be transported to the hospital emergency room.  If you develope a fever above 101 F, pus (white drainage) or increased drainage or redness at the wound, or calf pain, call your surgeon's office.   Constipation Prevention   Complete by: As directed    Drink plenty of fluids.  Prune juice may be helpful.  You may use a stool softener, such as Colace (over the counter) 100 mg twice a day.  Use MiraLax (over the counter) for constipation as needed.   Constipation Prevention   Complete by: As directed    Drink plenty of fluids.  Prune juice may be helpful.  You may use a stool softener, such as Colace (over the counter) 100 mg twice a day.  Use MiraLax (over the counter) for constipation as needed.   Diet - low sodium heart healthy   Complete by: As directed    Diet - low sodium heart healthy   Complete by: As directed    Discharge instructions   Complete by: As directed    INSTRUCTIONS AFTER JOINT REPLACEMENT   Remove items at home which could result in a fall. This includes throw rugs or furniture in walking pathways ICE to the affected joint every three hours while awake for 30 minutes at a time, for at least the first 3-5 days, and then as needed for pain and swelling.  Continue to use ice for pain and swelling. You may notice swelling that will progress down to the foot and ankle.  This is  normal after surgery.  Elevate your leg when you are not up walking on it.   Continue to use the breathing machine you got in the hospital (incentive spirometer) which will help keep your temperature down.  It is common for your temperature to cycle up and down following surgery, especially at night when you are not up moving around and exerting yourself.  The breathing machine keeps your lungs expanded and your temperature down.   DIET:  As you were doing prior to hospitalization, we recommend a well-balanced diet.  DRESSING / WOUND CARE / SHOWERING  You may shower 3 days after surgery, but keep the wounds dry during showering.  You may use an occlusive plastic wrap (Press'n Seal for example), NO SOAKING/SUBMERGING IN THE BATHTUB.  If the bandage gets wet, change with a clean dry gauze.  If the incision gets wet, pat the wound dry with a clean towel.  ACTIVITY  Increase activity slowly as tolerated, but follow the weight bearing instructions below.   No driving for 6 weeks or until further direction given by your physician.  You cannot drive while taking narcotics.  No lifting or carrying greater than 10 lbs. until further directed by your surgeon. Avoid periods of  inactivity such as sitting longer than an hour when not asleep. This helps prevent blood clots.  You may return to work once you are authorized by your doctor.     WEIGHT BEARING   Weight bearing as tolerated with assist device (walker, cane, etc) as directed, use it as long as suggested by your surgeon or therapist, typically at least 4-6 weeks.   EXERCISES  Results after joint replacement surgery are often greatly improved when you follow the exercise, range of motion and muscle strengthening exercises prescribed by your doctor. Safety measures are also important to protect the joint from further injury. Any time any of these exercises cause you to have increased pain or swelling, decrease what you are doing until you are  comfortable again and then slowly increase them. If you have problems or questions, call your caregiver or physical therapist for advice.   Rehabilitation is important following a joint replacement. After just a few days of immobilization, the muscles of the leg can become weakened and shrink (atrophy).  These exercises are designed to build up the tone and strength of the thigh and leg muscles and to improve motion. Often times heat used for twenty to thirty minutes before working out will loosen up your tissues and help with improving the range of motion but do not use heat for the first two weeks following surgery (sometimes heat can increase post-operative swelling).   These exercises can be done on a training (exercise) mat, on the floor, on a table or on a bed. Use whatever works the best and is most comfortable for you.    Use music or television while you are exercising so that the exercises are a pleasant break in your day. This will make your life better with the exercises acting as a break in your routine that you can look forward to.   Perform all exercises about fifteen times, three times per day or as directed.  You should exercise both the operative leg and the other leg as well.  Exercises include:   Quad Sets - Tighten up the muscle on the front of the thigh (Quad) and hold for 5-10 seconds.   Straight Leg Raises - With your knee straight (if you were given a brace, keep it on), lift the leg to 60 degrees, hold for 3 seconds, and slowly lower the leg.  Perform this exercise against resistance later as your leg gets stronger.  Leg Slides: Lying on your back, slowly slide your foot toward your buttocks, bending your knee up off the floor (only go as far as is comfortable). Then slowly slide your foot back down until your leg is flat on the floor again.  Angel Wings: Lying on your back spread your legs to the side as far apart as you can without causing discomfort.  Hamstring Strength:   Lying on your back, push your heel against the floor with your leg straight by tightening up the muscles of your buttocks.  Repeat, but this time bend your knee to a comfortable angle, and push your heel against the floor.  You may put a pillow under the heel to make it more comfortable if necessary.   A rehabilitation program following joint replacement surgery can speed recovery and prevent re-injury in the future due to weakened muscles. Contact your doctor or a physical therapist for more information on knee rehabilitation.    CONSTIPATION  Constipation is defined medically as fewer than three stools per week and severe constipation  as less than one stool per week.  Even if you have a regular bowel pattern at home, your normal regimen is likely to be disrupted due to multiple reasons following surgery.  Combination of anesthesia, postoperative narcotics, change in appetite and fluid intake all can affect your bowels.   YOU MUST use at least one of the following options; they are listed in order of increasing strength to get the job done.  They are all available over the counter, and you may need to use some, POSSIBLY even all of these options:    Drink plenty of fluids (prune juice may be helpful) and high fiber foods Colace 100 mg by mouth twice a day  Senokot for constipation as directed and as needed Dulcolax (bisacodyl ), take with full glass of water   Miralax (polyethylene glycol) once or twice a day as needed.  If you have tried all these things and are unable to have a bowel movement in the first 3-4 days after surgery call either your surgeon or your primary doctor.    If you experience loose stools or diarrhea, hold the medications until you stool forms back up.  If your symptoms do not get better within 1 week or if they get worse, check with your doctor.  If you experience the worst abdominal pain ever or develop nausea or vomiting, please contact the office immediately for further  recommendations for treatment.   ITCHING:  If you experience itching with your medications, try taking only a single pain pill, or even half a pain pill at a time.  You can also use Benadryl  over the counter for itching or also to help with sleep.   TED HOSE STOCKINGS:  Use stockings on both legs until for at least 2 weeks or as directed by physician office. They may be removed at night for sleeping.  MEDICATIONS:  See your medication summary on the After Visit Summary that nursing will review with you.  You may have some home medications which will be placed on hold until you complete the course of blood thinner medication.  It is important for you to complete the blood thinner medication as prescribed.  PRECAUTIONS:  If you experience chest pain or shortness of breath - call 911 immediately for transfer to the hospital emergency department.   If you develop a fever greater that 101 F, purulent drainage from wound, increased redness or drainage from wound, foul odor from the wound/dressing, or calf pain - CONTACT YOUR SURGEON.                                                   FOLLOW-UP APPOINTMENTS:  If you do not already have a post-op appointment, please call the office for an appointment to be seen by your surgeon.  Guidelines for how soon to be seen are listed in your After Visit Summary, but are typically between 1-4 weeks after surgery.  OTHER INSTRUCTIONS:   Knee Replacement:  Do not place pillow under knee, focus on keeping the knee straight while resting. CPM instructions: 0-90 degrees, 2 hours in the morning, 2 hours in the afternoon, and 2 hours in the evening. Place foam block, curve side up under heel at all times except when in CPM or when walking.  DO NOT modify, tear, cut, or change the foam block in any way.  POST-OPERATIVE OPIOID TAPER INSTRUCTIONS: It is important to wean off of your opioid medication as soon as possible. If you do not need pain medication after your  surgery it is ok to stop day one. Opioids include: Codeine, Hydrocodone (Norco, Vicodin), Oxycodone (Percocet, oxycontin ) and hydromorphone  amongst others.  Long term and even short term use of opiods can cause: Increased pain response Dependence Constipation Depression Respiratory depression And more.  Withdrawal symptoms can include Flu like symptoms Nausea, vomiting And more Techniques to manage these symptoms Hydrate well Eat regular healthy meals Stay active Use relaxation techniques(deep breathing, meditating, yoga) Do Not substitute Alcohol to help with tapering If you have been on opioids for less than two weeks and do not have pain than it is ok to stop all together.  Plan to wean off of opioids This plan should start within one week post op of your joint replacement. Maintain the same interval or time between taking each dose and first decrease the dose.  Cut the total daily intake of opioids by one tablet each day Next start to increase the time between doses. The last dose that should be eliminated is the evening dose.     MAKE SURE YOU:  Understand these instructions.  Get help right away if you are not doing well or get worse.    Thank you for letting us  be a part of your medical care team.  It is a privilege we respect greatly.  We hope these instructions will help you stay on track for a fast and full recovery!   Discharge instructions   Complete by: As directed    INSTRUCTIONS AFTER JOINT REPLACEMENT   Remove items at home which could result in a fall. This includes throw rugs or furniture in walking pathways ICE to the affected joint every three hours while awake for 30 minutes at a time, for at least the first 3-5 days, and then as needed for pain and swelling.  Continue to use ice for pain and swelling. You may notice swelling that will progress down to the foot and ankle.  This is normal after surgery.  Elevate your leg when you are not up walking on it.    Continue to use the breathing machine you got in the hospital (incentive spirometer) which will help keep your temperature down.  It is common for your temperature to cycle up and down following surgery, especially at night when you are not up moving around and exerting yourself.  The breathing machine keeps your lungs expanded and your temperature down.   DIET:  As you were doing prior to hospitalization, we recommend a well-balanced diet.  DRESSING / WOUND CARE / SHOWERING  You may shower 3 days after surgery, but keep the wounds dry during showering.  You may use an occlusive plastic wrap (Press'n Seal for example), NO SOAKING/SUBMERGING IN THE BATHTUB.  If the bandage gets wet, change with a clean dry gauze.  If the incision gets wet, pat the wound dry with a clean towel.  ACTIVITY  Increase activity slowly as tolerated, but follow the weight bearing instructions below.   No driving for 6 weeks or until further direction given by your physician.  You cannot drive while taking narcotics.  No lifting or carrying greater than 10 lbs. until further directed by your surgeon. Avoid periods of inactivity such as sitting longer than an hour when not asleep. This helps prevent blood clots.  You may return to work once you are authorized by your  doctor.     WEIGHT BEARING   Weight bearing as tolerated with assist device (walker, cane, etc) as directed, use it as long as suggested by your surgeon or therapist, typically at least 4-6 weeks.   EXERCISES  Results after joint replacement surgery are often greatly improved when you follow the exercise, range of motion and muscle strengthening exercises prescribed by your doctor. Safety measures are also important to protect the joint from further injury. Any time any of these exercises cause you to have increased pain or swelling, decrease what you are doing until you are comfortable again and then slowly increase them. If you have problems or  questions, call your caregiver or physical therapist for advice.   Rehabilitation is important following a joint replacement. After just a few days of immobilization, the muscles of the leg can become weakened and shrink (atrophy).  These exercises are designed to build up the tone and strength of the thigh and leg muscles and to improve motion. Often times heat used for twenty to thirty minutes before working out will loosen up your tissues and help with improving the range of motion but do not use heat for the first two weeks following surgery (sometimes heat can increase post-operative swelling).   These exercises can be done on a training (exercise) mat, on the floor, on a table or on a bed. Use whatever works the best and is most comfortable for you.    Use music or television while you are exercising so that the exercises are a pleasant break in your day. This will make your life better with the exercises acting as a break in your routine that you can look forward to.   Perform all exercises about fifteen times, three times per day or as directed.  You should exercise both the operative leg and the other leg as well.  Exercises include:   Quad Sets - Tighten up the muscle on the front of the thigh (Quad) and hold for 5-10 seconds.   Straight Leg Raises - With your knee straight (if you were given a brace, keep it on), lift the leg to 60 degrees, hold for 3 seconds, and slowly lower the leg.  Perform this exercise against resistance later as your leg gets stronger.  Leg Slides: Lying on your back, slowly slide your foot toward your buttocks, bending your knee up off the floor (only go as far as is comfortable). Then slowly slide your foot back down until your leg is flat on the floor again.  Angel Wings: Lying on your back spread your legs to the side as far apart as you can without causing discomfort.  Hamstring Strength:  Lying on your back, push your heel against the floor with your leg straight  by tightening up the muscles of your buttocks.  Repeat, but this time bend your knee to a comfortable angle, and push your heel against the floor.  You may put a pillow under the heel to make it more comfortable if necessary.   A rehabilitation program following joint replacement surgery can speed recovery and prevent re-injury in the future due to weakened muscles. Contact your doctor or a physical therapist for more information on knee rehabilitation.    CONSTIPATION  Constipation is defined medically as fewer than three stools per week and severe constipation as less than one stool per week.  Even if you have a regular bowel pattern at home, your normal regimen is likely to be disrupted due to  multiple reasons following surgery.  Combination of anesthesia, postoperative narcotics, change in appetite and fluid intake all can affect your bowels.   YOU MUST use at least one of the following options; they are listed in order of increasing strength to get the job done.  They are all available over the counter, and you may need to use some, POSSIBLY even all of these options:    Drink plenty of fluids (prune juice may be helpful) and high fiber foods Colace 100 mg by mouth twice a day  Senokot for constipation as directed and as needed Dulcolax (bisacodyl ), take with full glass of water   Miralax (polyethylene glycol) once or twice a day as needed.  If you have tried all these things and are unable to have a bowel movement in the first 3-4 days after surgery call either your surgeon or your primary doctor.    If you experience loose stools or diarrhea, hold the medications until you stool forms back up.  If your symptoms do not get better within 1 week or if they get worse, check with your doctor.  If you experience the worst abdominal pain ever or develop nausea or vomiting, please contact the office immediately for further recommendations for treatment.   ITCHING:  If you experience itching with  your medications, try taking only a single pain pill, or even half a pain pill at a time.  You can also use Benadryl  over the counter for itching or also to help with sleep.   TED HOSE STOCKINGS:  Use stockings on both legs until for at least 2 weeks or as directed by physician office. They may be removed at night for sleeping.  MEDICATIONS:  See your medication summary on the After Visit Summary that nursing will review with you.  You may have some home medications which will be placed on hold until you complete the course of blood thinner medication.  It is important for you to complete the blood thinner medication as prescribed.  PRECAUTIONS:  If you experience chest pain or shortness of breath - call 911 immediately for transfer to the hospital emergency department.   If you develop a fever greater that 101 F, purulent drainage from wound, increased redness or drainage from wound, foul odor from the wound/dressing, or calf pain - CONTACT YOUR SURGEON.                                                   FOLLOW-UP APPOINTMENTS:  If you do not already have a post-op appointment, please call the office for an appointment to be seen by your surgeon.  Guidelines for how soon to be seen are listed in your After Visit Summary, but are typically between 1-4 weeks after surgery.  OTHER INSTRUCTIONS:   Knee Replacement:  Do not place pillow under knee, focus on keeping the knee straight while resting. CPM instructions: 0-90 degrees, 2 hours in the morning, 2 hours in the afternoon, and 2 hours in the evening. Place foam block, curve side up under heel at all times except when in CPM or when walking.  DO NOT modify, tear, cut, or change the foam block in any way.  POST-OPERATIVE OPIOID TAPER INSTRUCTIONS: It is important to wean off of your opioid medication as soon as possible. If you do not need pain medication after your surgery  it is ok to stop day one. Opioids include: Codeine, Hydrocodone (Norco,  Vicodin), Oxycodone (Percocet, oxycontin ) and hydromorphone  amongst others.  Long term and even short term use of opiods can cause: Increased pain response Dependence Constipation Depression Respiratory depression And more.  Withdrawal symptoms can include Flu like symptoms Nausea, vomiting And more Techniques to manage these symptoms Hydrate well Eat regular healthy meals Stay active Use relaxation techniques(deep breathing, meditating, yoga) Do Not substitute Alcohol to help with tapering If you have been on opioids for less than two weeks and do not have pain than it is ok to stop all together.  Plan to wean off of opioids This plan should start within one week post op of your joint replacement. Maintain the same interval or time between taking each dose and first decrease the dose.  Cut the total daily intake of opioids by one tablet each day Next start to increase the time between doses. The last dose that should be eliminated is the evening dose.     MAKE SURE YOU:  Understand these instructions.  Get help right away if you are not doing well or get worse.    Thank you for letting us  be a part of your medical care team.  It is a privilege we respect greatly.  We hope these instructions will help you stay on track for a fast and full recovery!   Increase activity slowly as tolerated   Complete by: As directed    Increase activity slowly as tolerated   Complete by: As directed    Post-operative opioid taper instructions:   Complete by: As directed    POST-OPERATIVE OPIOID TAPER INSTRUCTIONS: It is important to wean off of your opioid medication as soon as possible. If you do not need pain medication after your surgery it is ok to stop day one. Opioids include: Codeine, Hydrocodone (Norco, Vicodin), Oxycodone (Percocet, oxycontin ) and hydromorphone  amongst others.  Long term and even short term use of opiods can cause: Increased pain  response Dependence Constipation Depression Respiratory depression And more.  Withdrawal symptoms can include Flu like symptoms Nausea, vomiting And more Techniques to manage these symptoms Hydrate well Eat regular healthy meals Stay active Use relaxation techniques(deep breathing, meditating, yoga) Do Not substitute Alcohol to help with tapering If you have been on opioids for less than two weeks and do not have pain than it is ok to stop all together.  Plan to wean off of opioids This plan should start within one week post op of your joint replacement. Maintain the same interval or time between taking each dose and first decrease the dose.  Cut the total daily intake of opioids by one tablet each day Next start to increase the time between doses. The last dose that should be eliminated is the evening dose.      Post-operative opioid taper instructions:   Complete by: As directed    POST-OPERATIVE OPIOID TAPER INSTRUCTIONS: It is important to wean off of your opioid medication as soon as possible. If you do not need pain medication after your surgery it is ok to stop day one. Opioids include: Codeine, Hydrocodone (Norco, Vicodin), Oxycodone (Percocet, oxycontin ) and hydromorphone  amongst others.  Long term and even short term use of opiods can cause: Increased pain response Dependence Constipation Depression Respiratory depression And more.  Withdrawal symptoms can include Flu like symptoms Nausea, vomiting And more Techniques to manage these symptoms Hydrate well Eat regular healthy meals Stay active Use relaxation techniques(deep breathing, meditating, yoga) Do Not substitute Alcohol  to help with tapering If you have been on opioids for less than two weeks and do not have pain than it is ok to stop all together.  Plan to wean off of opioids This plan should start within one week post op of your joint replacement. Maintain the same interval or time between taking  each dose and first decrease the dose.  Cut the total daily intake of opioids by one tablet each day Next start to increase the time between doses. The last dose that should be eliminated is the evening dose.           Follow-up Information     Sheril Coy, MD. Schedule an appointment as soon as possible for a visit in 2 week(s).   Specialty: Orthopedic Surgery Contact information: [address] [place] [address] [phone]                  Signed: Prentice [place] 03/20/2024, 8:35 AM

## 2024-03-20 NOTE — Progress Notes (Signed)
 Physical Therapy Treatment Patient Details Name: Robin Christensen MRN: [id number] DOB: 05/08/1951 Today's Date: 03/20/2024   History of Present Illness 73 yo female presents to therapy s/p L THA, anterior approach on 03/18/2024 due to failure of conservative measures. Pt PMH includes but is not limited to: hypothyroidism, OSA, HTN, HLD, depression, B LE edema, OA, hardening of arota, LBP, R ba Ca, A-fib, GERD, and B TKA.    PT Comments  Pt limited this afternoon due to medical complications - orthostatic earlier, back to bed, going to get PRBC but IV had infiltrated (unsure if she got a bolus of fluid earlier) , and just feels wiped out.  Pt agreeable to bed exercises and tolerated well with good strength and ROM.  Expect pt to progress well once medical issues resolve.     If plan is discharge home, recommend the following: Help with stairs or ramp for entrance;Assist for transportation;Assistance with cooking/housework;A little help with walking and/or transfers;A little help with bathing/dressing/bathroom   Can travel by private vehicle        Equipment Recommendations  None recommended by PT    Recommendations for Other Services       Precautions / Restrictions Precautions Precautions: Fall Restrictions Other Position/Activity Restrictions: WBAT     Mobility  Bed Mobility      General bed mobility comments: Pt back in bed.  Held OOB as pt reports still lightheaded when returned to bed, wiped out, she is to get PRBC, and that IV had infiltrated (unsure if she was able to get bolus). Discussed sitting EOB but pt reports fatigued and then IV team arrived.     Transfers  Ambulation/Gait                   Stairs             Wheelchair Mobility     Tilt Bed    Modified Rankin (Stroke Patients Only)       Balance                     Communication    Cognition Arousal: Alert Behavior During Therapy: WFL for tasks assessed/performed   PT  - Cognitive impairments: No apparent impairments                                Cueing    Exercises Total Joint Exercises Ankle Circles/Pumps: AROM, 20 reps, Both, Supine Quad Sets: AROM, Both, 20 reps, Supine Heel Slides: AROM, Right, AAROM, Left, 20 reps, Supine Hip ABduction/ADduction: AROM, Right, AAROM, Left, 20 reps Long Arc Quad: AROM, Both, 20 reps, Seated Other Exercises Other Exercises: UE shoulder press AROM x 20 -attempt to improve orthostatic hypotension    General Comments      Pertinent Vitals/Pain Pain Assessment Pain Assessment: 0-10 Pain Score: 3  Pain Location: L hip Pain Descriptors / Indicators: Discomfort Pain Intervention(s): Limited activity within patient's tolerance, Monitored during session, Premedicated before session, Repositioned    Home Living                          Prior Function            PT Goals (current goals can now be found in the care plan section) Progress towards PT goals: Progressing toward goals    Frequency    7X/week      PT Plan  Co-evaluation              AM-PAC PT 6 Clicks Mobility   Outcome Measure  Help needed turning from your back to your side while in a flat bed without using bedrails?: A Little Help needed moving from lying on your back to sitting on the side of a flat bed without using bedrails?: A Little Help needed moving to and from a bed to a chair (including a wheelchair)?: A Little Help needed standing up from a chair using your arms (e.g., wheelchair or bedside chair)?: A Little Help needed to walk in hospital room?: Total Help needed climbing 3-5 steps with a railing? : Total 6 Click Score: 14    End of Session Equipment Utilized During Treatment: Gait belt Activity Tolerance: Treatment limited secondary to medical complications (Comment) Patient left: with call bell/phone within reach;in bed;with nursing/sitter in room;with bed alarm set Nurse  Communication: Mobility status PT Visit Diagnosis: Other abnormalities of gait and mobility (R26.89);Muscle weakness (generalized) (M62.81)     Time: 8556-8545 PT Time Calculation (min) (ACUTE ONLY): 11 min  Charges:    1 Therapeutic Exercise  PT General Charges $$ ACUTE PT VISIT: 1 Visit                     Robin Christensen, PT Acute Rehab Services [facility] [phone]    Robin Christensen Robin Christensen 03/20/2024, 3:03 PM

## 2024-03-21 LAB — CBC
HCT: 27.3 % — ABNORMAL LOW (ref 36.0–46.0)
Hemoglobin: 8.9 g/dL — ABNORMAL LOW (ref 12.0–15.0)
MCH: 30.1 pg (ref 26.0–34.0)
MCHC: 32.6 g/dL (ref 30.0–36.0)
MCV: 92.2 fL (ref 80.0–100.0)
Platelets: 119 K/uL — ABNORMAL LOW (ref 150–400)
RBC: 2.96 MIL/uL — ABNORMAL LOW (ref 3.87–5.11)
RDW: 15.1 % (ref 11.5–15.5)
WBC: 7.9 K/uL (ref 4.0–10.5)
nRBC: 0 % (ref 0.0–0.2)

## 2024-03-21 LAB — TYPE AND SCREEN
ABO/RH(D): O POS
Antibody Screen: NEGATIVE
Unit division: 0
Unit division: 0

## 2024-03-21 LAB — BPAM RBC
Blood Product Expiration Date: 202510022359
Blood Product Expiration Date: 202510022359
ISSUE DATE / TIME: 202509031532
ISSUE DATE / TIME: 202509031849
Unit Type and Rh: 5100
Unit Type and Rh: 5100

## 2024-03-21 MED ORDER — SODIUM CHLORIDE 0.9 % IV BOLUS
500.0000 mL | Freq: Once | INTRAVENOUS | Status: AC
  Administered 2024-03-21: 500 mL via INTRAVENOUS

## 2024-03-21 NOTE — Plan of Care (Signed)
  Problem: Activity: Goal: Risk for activity intolerance will decrease Outcome: Progressing   Problem: Elimination: Goal: Will not experience complications related to bowel motility Outcome: Progressing   Problem: Pain Management: Goal: Pain level will decrease with appropriate interventions Outcome: Progressing   

## 2024-03-21 NOTE — Progress Notes (Signed)
 Physical Therapy Treatment Patient Details Name: Robin Christensen MRN: [id number] DOB: 21-Feb-1951 Today's Date: 03/21/2024   History of Present Illness 73 yo female presents to therapy s/p L THA, anterior approach on 03/18/2024 due to failure of conservative measures. Pt PMH includes but is not limited to: hypothyroidism, OSA, HTN, HLD, depression, B LE edema, OA, hardening of arota, LBP, R ba Ca, A-fib, GERD, and B TKA.    PT Comments  POD2 2nd session, pt denies dizziness, nausea and SOB throughout session. Pt able to ambulate 12 ft, 16 ft, 25 ft and 28 ft all with seated rest break between each, CGA +2 for safety, slow step to pattern, LLE stance time limited, progressing to step through at time with further gait distances. Pt with 1 step to enter home, unable to attempt stair training due to pain and fatigue with limited gait distances on level ground. Plan to progress mobility with plan to d/c home with family/friend support tomorrow.   If plan is discharge home, recommend the following: Help with stairs or ramp for entrance;Assist for transportation;Assistance with cooking/housework;A little help with walking and/or transfers;A little help with bathing/dressing/bathroom   Can travel by private vehicle        Equipment Recommendations       Recommendations for Other Services       Precautions / Restrictions Precautions Precautions: Fall Restrictions Weight Bearing Restrictions Per Provider Order: No Other Position/Activity Restrictions: WBAT     Mobility  Bed Mobility Overal bed mobility: Needs Assistance Bed Mobility: Supine to Sit     Supine to sit: Contact guard, HOB elevated, Used rails     General bed mobility comments: increased time and effort, pt using bedrails and self assisting LLE as needed to come to EOB    Transfers Overall transfer level: Needs assistance Equipment used: Rolling walker (2 wheels) Transfers: Sit to/from Stand Sit to Stand: Min assist,  Contact guard assist   Step pivot transfers: Min assist       General transfer comment: min A to CGA depending on fatigue and pain to power up from recliner and bedside, cues for hand placement    Ambulation/Gait Ambulation/Gait assistance: Contact guard assist, +2 safety/equipment Gait Distance (Feet):  (62ft, 16 ft, 26 ft and 28 ft) Assistive device: Rolling walker (2 wheels) Gait Pattern/deviations: Step-to pattern, Trunk flexed, Step-through pattern Gait velocity: decreased     General Gait Details: step to progressing to slight step through gait pattern, trunk forward flexed, minimal bil foot clearance, maintains body within RW, minimal LLE weightbearing, recliner follow with seated rest break between each amb bout   Stairs             Wheelchair Mobility     Tilt Bed    Modified Rankin (Stroke Patients Only)       Balance Overall balance assessment: Needs assistance Sitting-balance support: Feet supported, No upper extremity supported Sitting balance-Leahy Scale: Good     Standing balance support: Reliant on assistive device for balance, Bilateral upper extremity supported, During functional activity Standing balance-Leahy Scale: Poor Standing balance comment: steady with RW                            Communication Communication Communication: No apparent difficulties  Cognition Arousal: Alert Behavior During Therapy: WFL for tasks assessed/performed   PT - Cognitive impairments: No apparent impairments  Following commands: Intact      Cueing Cueing Techniques: Verbal cues  Exercises Total Joint Exercises Long Arc Quad: AROM, Both, 20 reps, Seated    General Comments        Pertinent Vitals/Pain Pain Assessment Pain Assessment: 0-10 Pain Score: 5  Pain Location: L hip Pain Descriptors / Indicators: Discomfort Pain Intervention(s): Limited activity within patient's tolerance, Monitored during  session, Repositioned, Patient requesting pain meds-RN notified, Ice applied    Home Living                          Prior Function            PT Goals (current goals can now be found in the care plan section) Progress towards PT goals: Progressing toward goals    Frequency    7X/week      PT Plan      Co-evaluation              AM-PAC PT 6 Clicks Mobility   Outcome Measure  Help needed turning from your back to your side while in a flat bed without using bedrails?: A Little Help needed moving from lying on your back to sitting on the side of a flat bed without using bedrails?: A Little Help needed moving to and from a bed to a chair (including a wheelchair)?: A Little Help needed standing up from a chair using your arms (e.g., wheelchair or bedside chair)?: A Little Help needed to walk in hospital room?: A Little Help needed climbing 3-5 steps with a railing? : Total 6 Click Score: 16    End of Session Equipment Utilized During Treatment: Gait belt Activity Tolerance: Patient tolerated treatment well;Patient limited by pain Patient left: in chair;with call bell/phone within reach;with chair alarm set;with family/visitor present Nurse Communication: Mobility status;Patient requests pain meds PT Visit Diagnosis: Other abnormalities of gait and mobility (R26.89);Muscle weakness (generalized) (M62.81)     Time: 8641-8576 PT Time Calculation (min) (ACUTE ONLY): 25 min  Charges:    $Gait Training: 8-22 mins $Therapeutic Activity: 8-22 mins PT General Charges $$ ACUTE PT VISIT: 1 Visit                     Tori Augie Vane PT, DPT 03/21/24, 2:30 PM

## 2024-03-21 NOTE — Progress Notes (Signed)
   03/21/24 1945  BiPAP/CPAP/SIPAP  BiPAP/CPAP/SIPAP Pt Type Adult  BiPAP/CPAP/SIPAP Resmed  Mask Type Nasal pillows  Dentures removed? Not applicable  Mask Size Medium  FiO2 (%) 21 %  Patient Home Machine No  Patient Home Mask Yes  Patient Home Tubing Yes  Auto Titrate Yes  Minimum cmH2O 5 cmH2O  Maximum cmH2O 20 cmH2O  Device Plugged into RED Power Outlet Yes  BiPAP/CPAP /SiPAP Vitals  Pulse Rate 82  Resp 16  SpO2 93 %  MEWS Score/Color  MEWS Score 0  MEWS Score Color Landy

## 2024-03-21 NOTE — Progress Notes (Signed)
   03/20/24 1951  BiPAP/CPAP/SIPAP  BiPAP/CPAP/SIPAP Pt Type Adult  BiPAP/CPAP/SIPAP Resmed  Mask Type Nasal pillows  Dentures removed? Not applicable  Mask Size Medium  FiO2 (%) 21 %  Patient Home Machine No  Patient Home Mask Yes  Patient Home Tubing Yes  Auto Titrate Yes  Minimum cmH2O 5 cmH2O  Maximum cmH2O 20 cmH2O  Device Plugged into RED Power Outlet Yes  BiPAP/CPAP /SiPAP Vitals  Pulse Rate 68  Resp 16  SpO2 97 %  MEWS Score/Color  MEWS Score 0  MEWS Score Color Landy

## 2024-03-21 NOTE — Progress Notes (Signed)
 Subjective: 2 Days Post-Op Procedure(s) (LRB): ARTHROPLASTY, HIP, TOTAL, ANTERIOR APPROACH (Left)  Patient feels much better after receiving blood yesterday. She is hoping to go home today.  Activity level:  wbat Diet tolerance:  ok Voiding:  ok Patient reports pain as mild.    Objective: Vital signs in last 24 hours: Temp:  [97.7 F (36.5 C)-98.4 F (36.9 C)] 98.2 F (36.8 C) (09/04 0508) Pulse Rate:  [59-73] 63 (09/04 0508) Resp:  [14-16] 16 (09/04 0508) BP: (95-120)/(45-101) 107/52 (09/04 0508) SpO2:  [86 %-99 %] 98 % (09/04 0508) FiO2 (%):  [21 %] 21 % (09/03 1951)  Labs: Recent Labs    03/19/24 1038 03/20/24 0329 03/21/24 0330  HGB 10.1* 8.2* 8.9*   Recent Labs    03/20/24 0329 03/21/24 0330  WBC 11.0* 7.9  RBC 2.67* 2.96*  HCT 25.6* 27.3*  PLT 142* 119*   Recent Labs    03/20/24 0329  NA 136  K 4.8  CL 101  CO2 26  BUN 17  CREATININE 0.85  GLUCOSE 136*  CALCIUM  8.6*   No results for input(s): LABPT, INR in the last 72 hours.  Physical Exam:  Neurologically intact ABD soft Neurovascular intact Sensation intact distally Intact pulses distally Dorsiflexion/Plantar flexion intact Incision: dressing C/D/I and no drainage No cellulitis present Compartment soft  Assessment/Plan:  2 Days Post-Op Procedure(s) (LRB): ARTHROPLASTY, HIP, TOTAL, ANTERIOR APPROACH (Left) Advance diet Up with therapy D/C IV fluids D/c home today if cleared by PT and doing well.  Follow up in office 2 weeks post op. Continue on baseline eliquis .   Robin Christensen 03/21/2024, 7:17 AM

## 2024-03-21 NOTE — Plan of Care (Signed)
  Problem: Clinical Measurements: Goal: Ability to maintain clinical measurements within normal limits will improve Outcome: Progressing   Problem: Activity: Goal: Risk for activity intolerance will decrease Outcome: Progressing   Problem: Elimination: Goal: Will not experience complications related to bowel motility Outcome: Progressing   Problem: Education: Goal: Knowledge of the prescribed therapeutic regimen will improve Outcome: Progressing Goal: Understanding of discharge needs will improve Outcome: Progressing   Problem: Pain Management: Goal: Pain level will decrease with appropriate interventions Outcome: Adequate for Discharge

## 2024-03-21 NOTE — Progress Notes (Signed)
 Physical Therapy Treatment Patient Details Name: Robin Christensen MRN: [id number] DOB: 03-25-1951 Today's Date: 03/21/2024   History of Present Illness 73 yo female presents to therapy s/p L THA, anterior approach on 03/18/2024 due to failure of conservative measures. Pt PMH includes but is not limited to: hypothyroidism, OSA, HTN, HLD, depression, B LE edema, OA, hardening of arota, LBP, R ba Ca, A-fib, GERD, and B TKA.    PT Comments  POD 2, pt continues to be limited by nausea, dizziness, SOB. Pt comes to sitting EOB with heavy bedrail use, slow to mobilize with increased effort, CGA for safety. Pt performs STS reps from bedside and recliner, min A to power up, strong BUE assisting to power up, slow to rise. Pt complete 5 STS reps total with first 3 pt needing to sit after ~30 sec, able to make it to 45 sec on 4th STS and maintains upright for ~90 sec on 5th time while amb forward ~4 ft with close recliner follow, min A for steadying assist. Pt noted to become pale, reports dizziness, SOB and nausea for all STS reps, though BP noted to remain stable, HR in 60s and SpO2 100%. Pt has 1 step to get into home and unable to ambulate on level ground significant distance at this time. Pt verbalizes arranging 24/7 support at home with a combination of family members and friends, but only details assistance through Sunday. Notified MD and RN of session. Plan to progress mobility as able in 2nd session.   If plan is discharge home, recommend the following: Help with stairs or ramp for entrance;Assist for transportation;Assistance with cooking/housework;A little help with walking and/or transfers;A little help with bathing/dressing/bathroom   Can travel by private vehicle        Equipment Recommendations       Recommendations for Other Services       Precautions / Restrictions Precautions Precautions: Fall Restrictions Other Position/Activity Restrictions: WBAT     Mobility  Bed Mobility Overal  bed mobility: Needs Assistance Bed Mobility: Supine to Sit     Supine to sit: Contact guard, HOB elevated, Used rails     General bed mobility comments: CGA to come to sitting EOB, pt pulling on bedrails and elevated HOB, increased time and effort    Transfers Overall transfer level: Needs assistance Equipment used: Rolling walker (2 wheels) Transfers: Sit to/from Stand, Bed to chair/wheelchair/BSC Sit to Stand: Min assist   Step pivot transfers: Min assist       General transfer comment: min A to power to stand from recliner and bed, slow to rise up with heavy BUE assisting, min A for step pivot to recliner at bedside    Ambulation/Gait Ambulation/Gait assistance: Min assist, +2 safety/equipment Gait Distance (Feet): 4 Feet Assistive device: Rolling walker (2 wheels) Gait Pattern/deviations: Step-to pattern, Trunk flexed, Shuffle       General Gait Details: short, slow step to gait pattern with minimal bil foot clearance, pt leaning forearms onto RW to support trunk while reporting dizziness, SOB and nausea, noted to have increased trunk sway and encouraged to return to sitting in recliner that was following to prevent fall - BP 125/61, HR 60s and SpO2 100% upon return to sitting, notified RN   Stairs             Wheelchair Mobility     Tilt Bed    Modified Rankin (Stroke Patients Only)       Balance Overall balance assessment: Needs assistance Sitting-balance support:  Feet supported, No upper extremity supported Sitting balance-Leahy Scale: Good     Standing balance support: Bilateral upper extremity supported, Reliant on assistive device for balance Standing balance-Leahy Scale: Poor Standing balance comment: steady with RW                            Communication Communication Communication: No apparent difficulties  Cognition Arousal: Alert Behavior During Therapy: WFL for tasks assessed/performed   PT - Cognitive impairments: No  apparent impairments                         Following commands: Intact      Cueing Cueing Techniques: Verbal cues  Exercises Total Joint Exercises Long Arc Quad: AROM, Both, 20 reps, Seated    General Comments General comments (skin integrity, edema, etc.): Pt BP 112/51 in supine, 115/88 in sitting and reports no dizziness. Upon standing, pt reports dizziness, nausea, SOB and attempted to check BP in standing but pt needed to return to sitting with BP read 112/68. Pt able to stand for 90 sec at best with minimal shuffling steps for 4 feet, noted to be pale, closing eyes, trunk sway, returns to sitting with vitals 125/61, HR 60s and SpO2 100% on RA- notified RN. Pt with improved color, alertness, resolving symptoms in sitting withing 1 minute.      Pertinent Vitals/Pain Pain Assessment Pain Assessment: 0-10 Pain Score: 4  Pain Location: L hip Pain Descriptors / Indicators: Discomfort Pain Intervention(s): Limited activity within patient's tolerance, Monitored during session, Repositioned, Ice applied, Premedicated before session    Home Living                          Prior Function            PT Goals (current goals can now be found in the care plan section) Progress towards PT goals: Progressing toward goals    Frequency    7X/week      PT Plan      Co-evaluation              AM-PAC PT 6 Clicks Mobility   Outcome Measure  Help needed turning from your back to your side while in a flat bed without using bedrails?: A Little Help needed moving from lying on your back to sitting on the side of a flat bed without using bedrails?: A Little Help needed moving to and from a bed to a chair (including a wheelchair)?: A Little Help needed standing up from a chair using your arms (e.g., wheelchair or bedside chair)?: A Little Help needed to walk in hospital room?: Total Help needed climbing 3-5 steps with a railing? : Total 6 Click Score: 14     End of Session Equipment Utilized During Treatment: Gait belt Activity Tolerance: Treatment limited secondary to medical complications (Comment) Patient left: in chair;with call bell/phone within reach;with chair alarm set;with family/visitor present Nurse Communication: Mobility status;Other (comment) (vitals, dizziness/nausea/SOB) PT Visit Diagnosis: Other abnormalities of gait and mobility (R26.89);Muscle weakness (generalized) (M62.81)     Time: 9064-8986 PT Time Calculation (min) (ACUTE ONLY): 38 min  Charges:    $Gait Training: 8-22 mins $Therapeutic Activity: 23-37 mins                       Tori Ellysa Parrack PT, DPT 03/21/24, 10:34 AM

## 2024-03-22 ENCOUNTER — Ambulatory Visit: Payer: Medicare (Managed Care)

## 2024-03-22 NOTE — Progress Notes (Signed)
 Physical Therapy Treatment Patient Details Name: Robin Christensen MRN: [id number] DOB: [birth year] Today's Date: 03/22/2024   History of Present Illness 73 yo female presents to therapy s/p L THA, anterior approach on 03/18/2024 due to failure of conservative measures. Pt PMH includes but is not limited to: hypothyroidism, OSA, HTN, HLD, depression, B LE edema, OA, hardening of aorta, LBP, R ba Ca, A-fib, GERD, and B TKA.    PT Comments  Pt is progressing well with mobility, she tolerated increased ambulation distance of 27' with RW, distance limited by fatigue. Stair training completed. Pt demonstrated good understanding of HEP. She is ready to DC home from a PT standpoint.     If plan is discharge home, recommend the following: Help with stairs or ramp for entrance;Assist for transportation;Assistance with cooking/housework;A little help with walking and/or transfers;A little help with bathing/dressing/bathroom   Can travel by private vehicle        Equipment Recommendations       Recommendations for Other Services       Precautions / Restrictions Precautions Precautions: Fall Recall of Precautions/Restrictions: Intact Restrictions Weight Bearing Restrictions Per Provider Order: No Other Position/Activity Restrictions: WBAT     Mobility  Bed Mobility   Bed Mobility: Supine to Sit     Supine to sit: Supervision, HOB elevated, Used rails     General bed mobility comments: VCs for technique using gait belt as LLE lifter, increased time    Transfers Overall transfer level: Needs assistance Equipment used: Rolling walker (2 wheels) Transfers: Sit to/from Stand Sit to Stand: Supervision           General transfer comment: VCs hand placement    Ambulation/Gait   Gait Distance (Feet): 47 Feet Assistive device: Rolling walker (2 wheels) Gait Pattern/deviations: Step-to pattern, Trunk flexed, Step-through pattern, Decreased step length - right, Decreased step length -  left Gait velocity: decreased     General Gait Details: VCs to lift head, steady, no loss of balance, distance limited by fatigue   Stairs Stairs: Yes Stairs assistance: Min assist Stair Management: No rails, Backwards, With walker Number of Stairs: 1 General stair comments: 1 step backwards x 2 trials, min A to steady RW, no loss of balance, VCs sequencing, handout for technique issued   Wheelchair Mobility     Tilt Bed    Modified Rankin (Stroke Patients Only)       Balance Overall balance assessment: Needs assistance Sitting-balance support: Feet supported, No upper extremity supported Sitting balance-Leahy Scale: Good     Standing balance support: Reliant on assistive device for balance, Bilateral upper extremity supported, During functional activity Standing balance-Leahy Scale: Poor Standing balance comment: steady with RW                            Communication Communication Communication: No apparent difficulties  Cognition Arousal: Alert Behavior During Therapy: WFL for tasks assessed/performed   PT - Cognitive impairments: No apparent impairments                         Following commands: Intact      Cueing Cueing Techniques: Verbal cues  Exercises Total Joint Exercises Ankle Circles/Pumps: AROM, Both, Supine, 15 reps Quad Sets: AROM, Both, Supine, 5 reps Short Arc Quad: AROM, Left, 5 reps, Supine Heel Slides: AAROM, Left, Supine, 5 reps Hip ABduction/ADduction: AAROM, Left, 5 reps, Supine Long Arc Quad: AROM, Left, 5 reps, Seated  General Comments        Pertinent Vitals/Pain Pain Assessment Pain Score: 4  Pain Location: L hip Pain Descriptors / Indicators: Discomfort, Operative site guarding, Sore Pain Intervention(s): Limited activity within patient's tolerance, Monitored during session, RN gave pain meds during session, Ice applied, Repositioned    Home Living                          Prior  Function            PT Goals (current goals can now be found in the care plan section) Acute Rehab PT Goals PT Goal Formulation: With patient Time For Goal Achievement: 03/26/24 Potential to Achieve Goals: Good Progress towards PT goals: Progressing toward goals    Frequency    7X/week      PT Plan      Co-evaluation              AM-PAC PT 6 Clicks Mobility   Outcome Measure  Help needed turning from your back to your side while in a flat bed without using bedrails?: A Little Help needed moving from lying on your back to sitting on the side of a flat bed without using bedrails?: A Little Help needed moving to and from a bed to a chair (including a wheelchair)?: None Help needed standing up from a chair using your arms (e.g., wheelchair or bedside chair)?: None Help needed to walk in hospital room?: None Help needed climbing 3-5 steps with a railing? : A Little 6 Click Score: 21    End of Session Equipment Utilized During Treatment: Gait belt Activity Tolerance: Patient tolerated treatment well Patient left: in chair;with call bell/phone within reach;with chair alarm set Nurse Communication: Mobility status PT Visit Diagnosis: Other abnormalities of gait and mobility (R26.89);Muscle weakness (generalized) (M62.81);Pain Pain - Right/Left: Left Pain - part of body: Hip     Time: 9047-8967 PT Time Calculation (min) (ACUTE ONLY): 40 min  Charges:    $Gait Training: 8-22 mins $Therapeutic Exercise: 8-22 mins $Therapeutic Activity: 8-22 mins PT General Charges $$ ACUTE PT VISIT: 1 Visit                     Sylvan Delon Copp PT 03/22/2024  Acute Rehabilitation Services  Office [phone]

## 2024-03-22 NOTE — Progress Notes (Signed)
 Subjective: 3 Days Post-Op Procedure(s) (LRB): ARTHROPLASTY, HIP, TOTAL, ANTERIOR APPROACH (Left)  Patient is feeling much better today and is wanting to go home.  Activity level:  wbat Diet tolerance:  ok Voiding:  ok Patient reports pain as mild.    Objective: Vital signs in last 24 hours: Temp:  [98.1 F (36.7 C)-98.5 F (36.9 C)] 98.2 F (36.8 C) (09/05 0611) Pulse Rate:  [63-82] 65 (09/05 0611) Resp:  [14-18] 18 (09/05 0611) BP: (94-131)/(42-68) 115/45 (09/05 0611) SpO2:  [92 %-98 %] 97 % (09/05 0611) FiO2 (%):  [21 %] 21 % (09/04 1945)  Labs: Recent Labs    03/19/24 1038 03/20/24 0329 03/21/24 0330  HGB 10.1* 8.2* 8.9*   Recent Labs    03/20/24 0329 03/21/24 0330  WBC 11.0* 7.9  RBC 2.67* 2.96*  HCT 25.6* 27.3*  PLT 142* 119*   Recent Labs    03/20/24 0329  NA 136  K 4.8  CL 101  CO2 26  BUN 17  CREATININE 0.85  GLUCOSE 136*  CALCIUM  8.6*   No results for input(s): LABPT, INR in the last 72 hours.  Physical Exam:  Neurologically intact ABD soft Neurovascular intact Sensation intact distally Intact pulses distally Dorsiflexion/Plantar flexion intact Incision: dressing C/D/I and no drainage No cellulitis present Compartment soft  Assessment/Plan:  3 Days Post-Op Procedure(s) (LRB): ARTHROPLASTY, HIP, TOTAL, ANTERIOR APPROACH (Left) Advance diet Up with therapy D/c home today if cleared by PT. Continue on baseline eliquis . Follow up in office 2 weeks post op.    Prentice [place] Severt 03/22/2024, 8:15 AM

## 2024-03-24 DIAGNOSIS — I10 Essential (primary) hypertension: Secondary | ICD-10-CM | POA: Diagnosis not present

## 2024-03-24 DIAGNOSIS — I4891 Unspecified atrial fibrillation: Secondary | ICD-10-CM | POA: Diagnosis not present

## 2024-03-27 ENCOUNTER — Other Ambulatory Visit: Payer: Self-pay

## 2024-03-27 ENCOUNTER — Ambulatory Visit: Payer: Medicare (Managed Care) | Attending: Orthopaedic Surgery | Admitting: Physical Therapy

## 2024-03-27 ENCOUNTER — Encounter: Payer: Self-pay | Admitting: Physical Therapy

## 2024-03-27 DIAGNOSIS — M6281 Muscle weakness (generalized): Secondary | ICD-10-CM | POA: Insufficient documentation

## 2024-03-27 DIAGNOSIS — R262 Difficulty in walking, not elsewhere classified: Secondary | ICD-10-CM | POA: Insufficient documentation

## 2024-03-27 DIAGNOSIS — Z96642 Presence of left artificial hip joint: Secondary | ICD-10-CM | POA: Diagnosis not present

## 2024-03-27 NOTE — Therapy (Signed)
 OUTPATIENT PHYSICAL THERAPY LOWER EXTREMITY EVALUATION   Patient Name: Robin Christensen MRN: [id number] DOB:1950/12/25, 73 y.o., female Today's Date: 03/27/2024  END OF SESSION:  PT End of Session - 03/27/24 1320     Visit Number 1    Number of Visits 16    Date for PT Re-Evaluation 05/22/24    Authorization Type [employer]age    Progress Note Due on Visit 10    PT Start Time 0939    PT Stop Time 1009    PT Time Calculation (min) 30 min    Activity Tolerance Patient tolerated treatment well    Behavior During Therapy WFL for tasks assessed/performed          Past Medical History:  Diagnosis Date   A-fib (HCC)    Arthritis    B12 deficiency    Back pain    Breast cancer (HCC)    Cancer (HCC)    Right Breast Cancer   Chronic headaches    Dysrhythmia    AF   Edema, lower extremity    Gallbladder disease    GERD (gastroesophageal reflux disease)    Hyperlipidemia    Hypertension    Hypothyroidism    Joint pain    Obesity    Pneumonia    PONV (postoperative nausea and vomiting)    Sleep apnea    cpcap   Vitamin deficiency    Past Surgical History:  Procedure Laterality Date   BREAST SURGERY     CARDIOVERSION N/A 06/26/2014   Procedure: CARDIOVERSION;  Surgeon: Erick JONELLE Bergamo, MD;  Location: [facility] ENDOSCOPY;  Service: Cardiovascular;  Laterality: N/A;   CARDIOVERSION  09/07/2023   done at [facility]   CATARACT EXTRACTION Bilateral    CHOLECYSTECTOMY     DILATION AND CURETTAGE OF UTERUS     eyelid surgery     TOTAL HIP ARTHROPLASTY Left 03/19/2024   Procedure: ARTHROPLASTY, HIP, TOTAL, ANTERIOR APPROACH;  Surgeon: Sheril Coy, MD;  Location: [facility] ORS;  Service: Orthopedics;  Laterality: Left;   TOTAL KNEE ARTHROPLASTY Right 05/03/2016   Procedure: TOTAL KNEE ARTHROPLASTY;  Surgeon: Coy Sheril, MD;  Location: [facility] OR;  Service: Orthopedics;  Laterality: Right;   TOTAL KNEE ARTHROPLASTY Left 08/14/2018   Procedure: TOTAL KNEE  ARTHROPLASTY;  Surgeon: Sheril Coy, MD;  Location: [facility] OR;  Service: Orthopedics;  Laterality: Left;   Patient Active Problem List   Diagnosis Date Noted   Primary localized osteoarthritis of left hip 03/19/2024   S/P total left hip arthroplasty 03/19/2024   Acquired thrombophilia (HCC) 02/06/2024   Hardening of the aorta (main artery of the heart) (HCC) 02/06/2024   Hypothyroidism 02/06/2024   Idiopathic sleep related nonobstructive alveolar hypoventilation 02/06/2024   Long term current use of anticoagulant therapy 02/06/2024   Obstructive sleep apnea syndrome 02/06/2024   S/P knee replacement 02/06/2024   Vitamin B12 deficiency (non anemic) 02/06/2024   Other fatigue increased 06/01/2020   Atrial fibrillation (HCC) 04/23/2020   Vitamin D  deficiency 04/08/2020   Hyperglycemia 03/12/2020   Essential hypertension 03/12/2020   Mixed hyperlipidemia 01/14/2020   Lower extremity edema 01/14/2020   Depression 01/14/2020   Class 1 obesity with serious comorbidity and body mass index (BMI) of 34.0 to 34.9 in adult 01/14/2020   Primary osteoarthritis of left knee 08/14/2018   Primary localized osteoarthritis of right knee 05/03/2016   Primary osteoarthritis of right knee 05/03/2016   Abnormal auditory perception of both ears 12/01/2015    PCP: Kip Righter  REFERRING  PROVIDER: Sheril Coy  REFERRING DIAG: s/p Lt THA  THERAPY DIAG:  Status post left hip replacement  Muscle weakness (generalized)  Difficulty in walking, not elsewhere classified  Rationale for Evaluation and Treatment: Rehabilitation  ONSET DATE: 03/19/24  SUBJECTIVE:   SUBJECTIVE STATEMENT: Pt is s/p Lt THA on 03/19/24. She ended up spending 3 days in the hospital due to blood pressure issues. She got home on 03/22/24. She ambulates into clinic with RW. She states she has had severe Lt hip pain. She states pain is all the time, pain increases with sit to stand and lifting Lt LE. Pain eases with  meds.  PERTINENT HISTORY: Bilat TKA, afib PAIN:  Are you having pain? Yes: NPRS scale: 5/10 currently, up to 9/10 at worst Pain location: Lt hip Pain description: severe Aggravating factors: transfers, lifting LT LE Relieving factors: meds  PRECAUTIONS: Fall  RED FLAGS: None   WEIGHT BEARING RESTRICTIONS: No  FALLS:  Has patient fallen in last 6 months? No  LIVING ENVIRONMENT: Lives with: lives alone Lives in: House/apartment Stairs: 1/2 step to enter Has following equipment at home: Environmental consultant - 2 wheeled  OCCUPATION: retired, enjoys walking  PLOF: Independent  PATIENT GOALS: walk without RW, decrease pain, drive  NEXT MD VISIT: 0/84/74  OBJECTIVE:  Note: Objective measures were completed at Evaluation unless otherwise noted.  PATIENT SURVEYS:  LEFS 17/80    EDEMA:  Edema Lt LE as expected post surgery    POSTURE: weight shift right  PALPATION: Bandage intact over incision  LOWER EXTREMITY ROM:  Lt hip ROM with deficits as expected s/p Lt THA  LOWER EXTREMITY MMT:  MMT Right eval Left eval  Hip flexion  3-  Hip extension    Hip abduction    Hip adduction    Hip internal rotation    Hip external rotation    Knee flexion  3  Knee extension  3  Ankle dorsiflexion  3+  Ankle plantarflexion    Ankle inversion    Ankle eversion     (Blank rows = not tested)   FUNCTIONAL TESTS:  30 seconds chair stand test = 1 with UE support  GAIT: Distance walked: 50' Assistive device utilized: Environmental consultant - 2 wheeled Level of assistance: Modified independence Comments: pt able to perform step through pattern, decreased stance time on Lt                                                                                                                                TREATMENT DATE: 03/27/24 See HEP PT educated pt on HEP, PT POC and goals, usual progression of treatment    PATIENT EDUCATION:  Education details: PT POC and goals, HEP Person educated:  Patient Education method: Explanation, Demonstration, and Handouts Education comprehension: verbalized understanding and returned demonstration  HOME EXERCISE PROGRAM: Access Code: [email or web address] URL: [email or web address] Date: 03/27/2024 Prepared by: Darice Conine  Exercises - Standing Hip Abduction with Counter Support  -  1 x daily - 7 x weekly - 3 sets - 10 reps - Standing Hip Extension with Counter Support  - 1 x daily - 7 x weekly - 3 sets - 10 reps - Standing March with Counter Support  - 1 x daily - 7 x weekly - 3 sets - 10 reps - Supine Hip Abduction  - 1 x daily - 7 x weekly - 3 sets - 10 reps - Supine Heel Slide with Strap  - 1 x daily - 7 x weekly - 3 sets - 10 reps - Seated Long Arc Quad  - 1 x daily - 7 x weekly - 3 sets - 10 reps  ASSESSMENT:  CLINICAL IMPRESSION: Patient is a 73 y.o. female who was seen today for physical therapy evaluation and treatment for Lt THA on 03/19/24. She had complications post op and was d/c'd from the hospital 03/22/24. She presents with impaired gait and functional mobility, impaired strength and balance and will benefit from skilled PT to address deficits and improve functional mobility and independence.   OBJECTIVE IMPAIRMENTS: Abnormal gait, decreased activity tolerance, decreased balance, decreased mobility, difficulty walking, decreased ROM, decreased strength, increased edema, increased muscle spasms, and pain.   ACTIVITY LIMITATIONS: standing, squatting, stairs, transfers, bed mobility, bathing, dressing, and locomotion level  PARTICIPATION LIMITATIONS: meal prep, cleaning, laundry, driving, shopping, and community activity  PERSONAL FACTORS: Time since onset of injury/illness/exacerbation and 1-2 comorbidities: history of bilat TKA, afib are also affecting patient's functional outcome.   REHAB POTENTIAL: Good  CLINICAL DECISION MAKING: Evolving/moderate complexity  EVALUATION COMPLEXITY: Moderate   GOALS: Goals  reviewed with patient? Yes  SHORT TERM GOALS: Target date: 04/24/2024   Pt will be independent with initial HEP Baseline: Goal status: INITIAL  2.  Pt will improve Lt LE strength to 4/5 to improve standing and walking tolerance Baseline:  Goal status: INITIAL    LONG TERM GOALS: Target date: 05/22/2024   Pt will be independent with advanced HEP Baseline:  Goal status: INITIAL  2.  Pt will improve LEFS to >= 27/80 to demo improved functional mobility Baseline:  Goal status: INITIAL  3.  Pt will perform car transfers without use of leg lifter strap Baseline:  Goal status: INITIAL  4.  Pt will walk x 150' without AD with pain <= 2/10 Baseline:  Goal status: INITIAL     PLAN:  PT FREQUENCY: 2x/week  PT DURATION: 8 weeks  PLANNED INTERVENTIONS: 97164- PT Re-evaluation, 97110-Therapeutic exercises, 97530- Therapeutic activity, 97112- Neuromuscular re-education, 97535- Self Care, 02859- Manual therapy, [phone]- Gait training, [phone]- Aquatic Therapy, [phone]- Electrical stimulation (unattended), [phone] (1-2 muscles), 20561 (3+ muscles)- Dry Needling, Patient/Family education, Balance training, Stair training, Taping, Cryotherapy, and Moist heat  PLAN FOR NEXT SESSION: hip strength, gait, balance   Gurkirat Basher, PT 03/27/2024, 2:02 PM

## 2024-04-01 ENCOUNTER — Ambulatory Visit (HOSPITAL_COMMUNITY)
Admission: RE | Admit: 2024-04-01 | Discharge: 2024-04-01 | Disposition: A | Discharge status: Home / Self Care | Payer: Medicare (Managed Care) | Source: Ambulatory Visit | Attending: Surgery | Admitting: Surgery

## 2024-04-01 ENCOUNTER — Other Ambulatory Visit (HOSPITAL_COMMUNITY): Payer: Self-pay | Admitting: Orthopaedic Surgery

## 2024-04-01 DIAGNOSIS — M79605 Pain in left leg: Secondary | ICD-10-CM | POA: Diagnosis not present

## 2024-04-01 DIAGNOSIS — M7989 Other specified soft tissue disorders: Secondary | ICD-10-CM | POA: Diagnosis not present

## 2024-04-01 DIAGNOSIS — M1612 Unilateral primary osteoarthritis, left hip: Secondary | ICD-10-CM | POA: Diagnosis not present

## 2024-04-01 NOTE — Therapy (Signed)
 OUTPATIENT PHYSICAL THERAPY TREATMENT   Patient Name: Robin Christensen MRN: [id number] DOB:02-28-1951, 73 y.o., female Today's Date: 04/01/2024  END OF SESSION:    Past Medical History:  Diagnosis Date   A-fib (HCC)    Arthritis    B12 deficiency    Back pain    Breast cancer (HCC)    Cancer (HCC)    Right Breast Cancer   Chronic headaches    Dysrhythmia    AF   Edema, lower extremity    Gallbladder disease    GERD (gastroesophageal reflux disease)    Hyperlipidemia    Hypertension    Hypothyroidism    Joint pain    Obesity    Pneumonia    PONV (postoperative nausea and vomiting)    Sleep apnea    cpcap   Vitamin deficiency    Past Surgical History:  Procedure Laterality Date   BREAST SURGERY     CARDIOVERSION N/A 06/26/2014   Procedure: CARDIOVERSION;  Surgeon: Erick JONELLE Bergamo, MD;  Location: [facility] ENDOSCOPY;  Service: Cardiovascular;  Laterality: N/A;   CARDIOVERSION  09/07/2023   done at [facility]   CATARACT EXTRACTION Bilateral    CHOLECYSTECTOMY     DILATION AND CURETTAGE OF UTERUS     eyelid surgery     TOTAL HIP ARTHROPLASTY Left 03/19/2024   Procedure: ARTHROPLASTY, HIP, TOTAL, ANTERIOR APPROACH;  Surgeon: Sheril Coy, MD;  Location: [facility] ORS;  Service: Orthopedics;  Laterality: Left;   TOTAL KNEE ARTHROPLASTY Right 05/03/2016   Procedure: TOTAL KNEE ARTHROPLASTY;  Surgeon: Coy Sheril, MD;  Location: [facility] OR;  Service: Orthopedics;  Laterality: Right;   TOTAL KNEE ARTHROPLASTY Left 08/14/2018   Procedure: TOTAL KNEE ARTHROPLASTY;  Surgeon: Sheril Coy, MD;  Location: [facility] OR;  Service: Orthopedics;  Laterality: Left;   Patient Active Problem List   Diagnosis Date Noted   Primary localized osteoarthritis of left hip 03/19/2024   S/P total left hip arthroplasty 03/19/2024   Acquired thrombophilia (HCC) 02/06/2024   Hardening of the aorta (main artery of the heart) (HCC) 02/06/2024   Hypothyroidism 02/06/2024   Idiopathic sleep related  nonobstructive alveolar hypoventilation 02/06/2024   Long term current use of anticoagulant therapy 02/06/2024   Obstructive sleep apnea syndrome 02/06/2024   S/P knee replacement 02/06/2024   Vitamin B12 deficiency (non anemic) 02/06/2024   Other fatigue increased 06/01/2020   Atrial fibrillation (HCC) 04/23/2020   Vitamin D  deficiency 04/08/2020   Hyperglycemia 03/12/2020   Essential hypertension 03/12/2020   Mixed hyperlipidemia 01/14/2020   Lower extremity edema 01/14/2020   Depression 01/14/2020   Class 1 obesity with serious comorbidity and body mass index (BMI) of 34.0 to 34.9 in adult 01/14/2020   Primary osteoarthritis of left knee 08/14/2018   Primary localized osteoarthritis of right knee 05/03/2016   Primary osteoarthritis of right knee 05/03/2016   Abnormal auditory perception of both ears 12/01/2015    PCP: Kip Righter  REFERRING PROVIDER: Sheril Coy  REFERRING DIAG: s/p Lt THA  THERAPY DIAG:  No diagnosis found.  Rationale for Evaluation and Treatment: Rehabilitation  ONSET DATE: 03/19/24  SUBJECTIVE:   Per eval: Pt is s/p Lt THA on 03/19/24. She ended up spending 3 days in the hospital due to blood pressure issues. She got home on 03/22/24. She ambulates into clinic with RW. She states she has had severe Lt hip pain. She states pain is all the time, pain increases with sit to stand and lifting Lt LE. Pain eases with meds.  SUBJECTIVE  STATEMENT: 04/01/2024: ***  PERTINENT HISTORY: Bilat TKA, afib PAIN:  Are you having pain? 04/01/2024 see subjective above  Per eval:  Yes: NPRS scale: 5/10 currently, up to 9/10 at worst Pain location: Lt hip Pain description: severe Aggravating factors: transfers, lifting LT LE Relieving factors: meds  PRECAUTIONS: Fall  RED FLAGS: None   WEIGHT BEARING RESTRICTIONS: No  FALLS:  Has patient fallen in last 6 months? No  LIVING ENVIRONMENT: Lives with: lives alone Lives in: House/apartment Stairs: 1/2  step to enter Has following equipment at home: Environmental consultant - 2 wheeled  OCCUPATION: retired, enjoys walking  PLOF: Independent  PATIENT GOALS: walk without RW, decrease pain, drive  NEXT MD VISIT: 0/84/74 ***   OBJECTIVE:  Note: Objective measures were completed at Evaluation unless otherwise noted.  PATIENT SURVEYS:  LEFS 17/80    EDEMA:  Edema Lt LE as expected post surgery    POSTURE: weight shift right  PALPATION: Bandage intact over incision  LOWER EXTREMITY ROM:  Lt hip ROM with deficits as expected s/p Lt THA  LOWER EXTREMITY MMT:  MMT Right eval Left eval  Hip flexion  3-  Hip extension    Hip abduction    Hip adduction    Hip internal rotation    Hip external rotation    Knee flexion  3  Knee extension  3  Ankle dorsiflexion  3+  Ankle plantarflexion    Ankle inversion    Ankle eversion     (Blank rows = not tested)   FUNCTIONAL TESTS:  30 seconds chair stand test = 1 with UE support  GAIT: Distance walked: 50' Assistive device utilized: Environmental consultant - 2 wheeled Level of assistance: Modified independence Comments: pt able to perform step through pattern, decreased stance time on Lt                                                                                                                                TREATMENT DATE:  [facility] Adult PT Treatment:                                                DATE: 04/02/24 Therapeutic Exercise: *** Manual Therapy: *** Neuromuscular re-ed: *** Therapeutic Activity: *** Modalities: *** Self Care: ***    03/27/24 See HEP PT educated pt on HEP, PT POC and goals, usual progression of treatment    PATIENT EDUCATION:  Education details: PT POC and goals, HEP Person educated: Patient Education method: Explanation, Demonstration, and Handouts Education comprehension: verbalized understanding and returned demonstration  HOME EXERCISE PROGRAM: Access Code: [email or web address] URL:  [email or web address] Date: 03/27/2024 Prepared by: Darice Conine  Exercises - Standing Hip Abduction with Counter Support  - 1 x daily - 7 x weekly - 3 sets - 10 reps - Standing Hip Extension with Counter Support  -  1 x daily - 7 x weekly - 3 sets - 10 reps - Standing March with Counter Support  - 1 x daily - 7 x weekly - 3 sets - 10 reps - Supine Hip Abduction  - 1 x daily - 7 x weekly - 3 sets - 10 reps - Supine Heel Slide with Strap  - 1 x daily - 7 x weekly - 3 sets - 10 reps - Seated Long Arc Quad  - 1 x daily - 7 x weekly - 3 sets - 10 reps  ASSESSMENT:  CLINICAL IMPRESSION: 04/01/2024: ***  Per eval: Patient is a 73 y.o. female who was seen today for physical therapy evaluation and treatment for Lt THA on 03/19/24. She had complications post op and was d/c'd from the hospital 03/22/24. She presents with impaired gait and functional mobility, impaired strength and balance and will benefit from skilled PT to address deficits and improve functional mobility and independence.   OBJECTIVE IMPAIRMENTS: Abnormal gait, decreased activity tolerance, decreased balance, decreased mobility, difficulty walking, decreased ROM, decreased strength, increased edema, increased muscle spasms, and pain.   ACTIVITY LIMITATIONS: standing, squatting, stairs, transfers, bed mobility, bathing, dressing, and locomotion level  PARTICIPATION LIMITATIONS: meal prep, cleaning, laundry, driving, shopping, and community activity  PERSONAL FACTORS: Time since onset of injury/illness/exacerbation and 1-2 comorbidities: history of bilat TKA, afib are also affecting patient's functional outcome.   REHAB POTENTIAL: Good  CLINICAL DECISION MAKING: Evolving/moderate complexity  EVALUATION COMPLEXITY: Moderate   GOALS: Goals reviewed with patient? Yes  SHORT TERM GOALS: Target date: 04/24/2024   Pt will be independent with initial HEP Baseline: Goal status: INITIAL  2.  Pt will improve Lt LE  strength to 4/5 to improve standing and walking tolerance Baseline:  Goal status: INITIAL    LONG TERM GOALS: Target date: 05/22/2024   Pt will be independent with advanced HEP Baseline:  Goal status: INITIAL  2.  Pt will improve LEFS to >= 27/80 to demo improved functional mobility Baseline:  Goal status: INITIAL  3.  Pt will perform car transfers without use of leg lifter strap Baseline:  Goal status: INITIAL  4.  Pt will walk x 150' without AD with pain <= 2/10 Baseline:  Goal status: INITIAL     PLAN:  PT FREQUENCY: 2x/week  PT DURATION: 8 weeks  PLANNED INTERVENTIONS: 97164- PT Re-evaluation, 97110-Therapeutic exercises, 97530- Therapeutic activity, 97112- Neuromuscular re-education, 97535- Self Care, 02859- Manual therapy, [phone]- Gait training, [phone]- Aquatic Therapy, [phone]- Electrical stimulation (unattended), [phone] (1-2 muscles), [phone] (3+ muscles)- Dry Needling, Patient/Family education, Balance training, Stair training, Taping, Cryotherapy, and Moist heat  PLAN FOR NEXT SESSION: hip strength, gait, balance   Alm DELENA Jenny PT, DPT 04/01/2024 7:12 AM

## 2024-04-02 ENCOUNTER — Encounter: Payer: Self-pay | Admitting: Physical Therapy

## 2024-04-02 ENCOUNTER — Ambulatory Visit: Payer: Medicare (Managed Care) | Admitting: Physical Therapy

## 2024-04-02 DIAGNOSIS — R600 Localized edema: Secondary | ICD-10-CM | POA: Diagnosis not present

## 2024-04-02 DIAGNOSIS — Z7902 Long term (current) use of antithrombotics/antiplatelets: Secondary | ICD-10-CM | POA: Diagnosis not present

## 2024-04-02 DIAGNOSIS — E538 Deficiency of other specified B group vitamins: Secondary | ICD-10-CM | POA: Diagnosis not present

## 2024-04-02 DIAGNOSIS — Z7409 Other reduced mobility: Secondary | ICD-10-CM | POA: Diagnosis not present

## 2024-04-02 DIAGNOSIS — M6281 Muscle weakness (generalized): Secondary | ICD-10-CM

## 2024-04-02 DIAGNOSIS — R262 Difficulty in walking, not elsewhere classified: Secondary | ICD-10-CM

## 2024-04-02 DIAGNOSIS — R32 Unspecified urinary incontinence: Secondary | ICD-10-CM | POA: Diagnosis not present

## 2024-04-02 DIAGNOSIS — I509 Heart failure, unspecified: Secondary | ICD-10-CM | POA: Diagnosis not present

## 2024-04-02 DIAGNOSIS — R55 Syncope and collapse: Secondary | ICD-10-CM | POA: Diagnosis not present

## 2024-04-02 DIAGNOSIS — R609 Edema, unspecified: Secondary | ICD-10-CM | POA: Diagnosis not present

## 2024-04-02 DIAGNOSIS — Z79890 Hormone replacement therapy (postmenopausal): Secondary | ICD-10-CM | POA: Diagnosis not present

## 2024-04-02 DIAGNOSIS — E559 Vitamin D deficiency, unspecified: Secondary | ICD-10-CM | POA: Diagnosis not present

## 2024-04-02 DIAGNOSIS — E039 Hypothyroidism, unspecified: Secondary | ICD-10-CM | POA: Diagnosis not present

## 2024-04-02 DIAGNOSIS — D649 Anemia, unspecified: Secondary | ICD-10-CM | POA: Diagnosis not present

## 2024-04-02 DIAGNOSIS — Z6838 Body mass index (BMI) 38.0-38.9, adult: Secondary | ICD-10-CM | POA: Diagnosis not present

## 2024-04-02 DIAGNOSIS — G4733 Obstructive sleep apnea (adult) (pediatric): Secondary | ICD-10-CM | POA: Diagnosis not present

## 2024-04-02 DIAGNOSIS — Z96642 Presence of left artificial hip joint: Secondary | ICD-10-CM

## 2024-04-02 DIAGNOSIS — R531 Weakness: Secondary | ICD-10-CM | POA: Diagnosis not present

## 2024-04-02 DIAGNOSIS — I10 Essential (primary) hypertension: Secondary | ICD-10-CM | POA: Diagnosis not present

## 2024-04-02 DIAGNOSIS — M7989 Other specified soft tissue disorders: Secondary | ICD-10-CM | POA: Diagnosis not present

## 2024-04-02 DIAGNOSIS — Z79899 Other long term (current) drug therapy: Secondary | ICD-10-CM | POA: Diagnosis not present

## 2024-04-02 DIAGNOSIS — I951 Orthostatic hypotension: Secondary | ICD-10-CM | POA: Diagnosis not present

## 2024-04-02 DIAGNOSIS — R7989 Other specified abnormal findings of blood chemistry: Secondary | ICD-10-CM | POA: Diagnosis not present

## 2024-04-02 DIAGNOSIS — Z6839 Body mass index (BMI) 39.0-39.9, adult: Secondary | ICD-10-CM | POA: Diagnosis not present

## 2024-04-02 DIAGNOSIS — I4891 Unspecified atrial fibrillation: Secondary | ICD-10-CM | POA: Diagnosis not present

## 2024-04-02 DIAGNOSIS — E785 Hyperlipidemia, unspecified: Secondary | ICD-10-CM | POA: Diagnosis not present

## 2024-04-02 DIAGNOSIS — I959 Hypotension, unspecified: Secondary | ICD-10-CM | POA: Diagnosis not present

## 2024-04-02 NOTE — ED Notes (Signed)
 Attempted to call report, was told we would have to give report after shift change at [facility]

## 2024-04-02 NOTE — H&P (Signed)
 [facility] HEALTH [facility] NHICS History & Physical   Ethics: Full Code    PCP: Beverley [employer], MD [phone]   Assessment & Plan:   Active Hospital Problems   # Near syncope-possibly secondary to orthostatic hypotension. Will check orthostatic vital signs. Currently pt is hypertensive and appears fluid overloaded. Will proceed with diuresis while monitoring blood pressure and orthostatics.     # Hyperlipidemia-continue statin     # Morbid obesity-affects all aspects of care.     # Urine incontinence-continue oxybutynin .     # S/P total left hip arthroplasty-had hip replacement on Sept 2, 2025. Continue PT.     # Hypothyroidism-continue synthroid .     # Obstructive sleep apnea syndrome-cpap ordered for nighttime use.     # Vitamin B12 deficiency (non anemic)-continue B12 supplements.    # Atrial fibrillation-continue eliquis  for secondary stroke prevention. HR low at 54. Not on any BB or CCB.     # Vitamin D  deficiency-continue vitamin d  supplementation     # Essential hypertension-continue prn IV hydralazine. Not on any home meds for hypertension per med list.     # Lower extremity edema-continue IV lasix . Will check Echo. Monitor strict ins/outs and daily weights.     Fluid and electrolyte disorder: Not present Plan: Repeat BMP in AM  Abnormal blood counts: Anemia, present on admission Plan: Repeat CBC in AM  Nutritional status: Body mass index is 39.53 kg/m. Obesity Plan: PCP follow up Regular Diet 2 gm NA  Coagulation defect: Not present Plan: N/A  Elevated LFTs/transaminitis due to: Not present, present on admission Plan: N/A  Debility: Impaired mobility due to medical condition, present on admission Plan: Consult PT  I expect Robin Christensen to require hospitalization and INPATIENT ADMISSION for at least two (2) midnights due to:  need for IV diuresis   Subjective   CC: lightheadedness, near syncope    History of Present Illness:    Robin Christensen is a 73 y.o. White or Caucasian [1] female with PMHx of OSA, hypothyroidism, a fib, hypertension, s/p hip replacement at [facility] on Sept 2nd who presented to [facility] ED for evaluation of weakness. Patient notes that on the way to PT and while at PT she was feeling lightheaded and as if she was going to pass out, having difficulty holding herself up. Staff checked her BP and noticed that she was roughly 70/50 so they recommended she present to the emergency department. States she has not had complications or symptoms up to this point. She did have LE doppler ultrasound which did not show any evidence of clots per the patient. She states they had a low suspicion for DVT given she is compliant with her Eliquis  and has been on it for many years for her A-fib. She denies chest pain or shortness of breath at this time but continues to have some generalized weakness. States that she has restarted her Lasix  recently but she was out of it for a couple months. She has noticed some more swelling of her lower extremities. Weight is up from baseline 240 lbs to 267 today.   Reviewed OP cardiology visit from yesterday.   In ED, vital signs notable for elevated BP of 165/71. Lab evaluation notable for elevated BNP of 3588. Initial troponin T was high at 14 with 1 hr repeat of 17 and delta 1 hr of 3. CBC showed low hemoglobin of 8.5 with hematocrit of 27.4. CXR with no acute findings. ECG showed sinus bradycardia.  Pt will be admitted to med/tele floor for further workup and treatment.      Review of Systems:  Constitutional: Denies fevers, chills, weight loss, anorexia. + fatigue + weight gain   HEENT: Denies earaches, epistaxis, or sore throat.  Respiratory: Denies dyspnea, cough, or wheezing.  Cardiovascular: Denies chest pain, palpitations, or syncope. + lower extremity swelling   Gastrointestinal: Denies abdominal pain, constipation, diarrhea, nausea or vomiting.  Musculoskeletal: Denies  arthralgias, back pain or myalgias.  Neurological: Denies headaches or weakness. + dizziness + lightheadedness   Behavioral/Psych: Denies suicidal or homicidal ideation.  Skin: Deniesr rash.  Heme: Denies bruises.    Past Medical History:  Diagnosis Date  . Atrial fibrillation (*)   . Breast cancer (*)     Past Surgical History:  Procedure Laterality Date  . Breast surgery    . Cholecystectomy    . Total hip arthroplasty      No family history on file.  Social History: Social History[1] Surrogate decision maker is son  Code Status: Full Code  Allergies: Allergies[2]  Medications: Prior to Admission medications  Medication Sig Start Date End Date Taking? Authorizing Provider  apixaban  (ELIQUIS ) 5 mg tablet Take one tablet (5 mg dose) by mouth 2 (two) times daily. 10/03/23  Yes Historical Provider, MD  baclofen  (LIORESAL ) 10 mg tablet Take one tablet (10 mg dose) by mouth 3 (three) times a day. 02/14/24  Yes Historical Provider, MD  Biotin 5000 MCG CAPS Take two capsules (10 mg dose) by mouth daily.    Historical Provider, MD  Calcium  Carb-Cholecalciferol 600-10 MG-MCG CHEW Chew 2 tablets by mouth daily.    Historical Provider, MD  cholecalciferol 1,000 units (25 mcg) tablet Take one tablet (1,000 Units dose) by mouth daily.    Historical Provider, MD  furosemide  (LASIX ) 40 mg tablet Take one tablet (40 mg dose) by mouth daily. 10/03/23  Yes Historical Provider, MD  gabapentin  (NEURONTIN ) 300 mg capsule Take one capsule (300 mg dose) by mouth 3 (three) times a day. 01/23/24  Yes Historical Provider, MD  HYDROcodone -acetaminophen  (NORCO) 5-325 mg per tablet Take one tablet to two tablets by mouth every 6 (six) hours as needed. 03/19/24 03/19/25 Yes Historical Provider, MD  levothyroxine  sodium (SYNTHROID ,LEVOTHROID,LEVOXYL ) 75 mcg tablet Take one tablet (75 mcg dose) by mouth daily.    Historical Provider, MD  Magnesium  250 MG TABS Take two tablets by mouth daily.    Historical  Provider, MD  magnesium  oxide (MAGNESIUM ) 400 TABS Take one tablet (400 mg dose) by mouth daily.    Historical Provider, MD  ondansetron  (ZOFRAN -ODT) 4 mg disintegrating tablet Take one tablet (4 mg dose) by mouth every 6 (six) hours as needed. 10/30/23   Powell JONETTA Bellman, DO  oxybutynin  (DITROPAN -XL) 10 MG 24 hr tablet Take one tablet (10 mg dose) by mouth daily. 07/14/23  Yes Historical Provider, MD  promethazine  (PHENERGAN ) 12.5 MG tablet Take one tablet to two tablets (12.5-25 mg dose) by mouth every 6 (six) hours as needed. 03/19/24  Yes Historical Provider, MD  rosuvastatin  calcium  (CRESTOR ) 10 mg tablet Take one tablet (10 mg dose) by mouth daily. 10/03/23  Yes Historical Provider, MD  sertraline  (ZOLOFT ) 25 mg tablet Take one tablet (25 mg dose) by mouth daily. 01/29/24  Yes Historical Provider, MD  sertraline  (ZOLOFT ) 50 mg tablet Take one tablet (50 mg dose) by mouth daily. 02/14/24  Yes Historical Provider, MD  tiZANidine  (ZANAFLEX ) 4 mg tablet Take one tablet (4 mg dose) by mouth every  6 (six) hours as needed. 03/19/24 03/19/25 Yes Historical Provider, MD  vitamin B-12 (CYANOCOBALAMIN ) 1000 mcg tablet Take one tablet (1,000 mcg dose) by mouth daily.    Historical Provider, MD  zonisamide  (ZONEGRAN ) 25 mg capsule Take three capsules (75 mg dose) by mouth daily. 04/17/23  Yes Historical Provider, MD     Objective:  Physical Exam: Temp:  [97.8 F (36.6 C)-98.7 F (37.1 C)]  Heart Rate:  [49-67]  Resp:  [9-43]  BP: (89-176)/(43-89)  SpO2:  [84 %-99 %]  General: Morbidly obese White or Caucasian [1] female resting comfortably in bed, in no acute distress.  Lymphatic: No cervical, axillary, inguinal lymphadenopathy.  Skin: No rashes.  HEENT: Normocephalic, atraumatic.  Pupils equally round and reactive to light & accomodation. Extraocular movements intact. Oropharynx moist & pink. Nasopharynx clear.   Neck: Supple. No meningismus, lymphadenopathy, carotid bruits, or jugular venous  distention.  Chest: Clear to auscultation. No rubs, rales, rhonchi.  Heart: Normal S1S2, no rubs, gallups, murmurs.  Abdomen: Soft, non-tender, non-distended. Normoactive bowel sounds. No hepatosplenomegaly. No masses.   Breasts, GU, Rectal: Deferred  Extremities: No clubbing, cyanosis. +1 pedal edema in bilateral lower extremities, worse on L side  Neurologic: Alert & oriented to person, place, time & situation. Cranial nerves II-XII intact.  No focal neurologic deficits. Deep tendon reflexes symmetrical. Finger to nose & Heel to shin normal.   Labs: Recent Results (from the past 24 hours)  CBC and Differential   Collection Time: 04/02/24 11:05 AM  Result Value Ref Range   WBC 7.4 4.0 - 10.5 thou/mcL   RBC 2.85 (L) 3.93 - 5.22 million/mcL   HGB 8.5 (L) 11.2 - 15.7 gm/dL   HCT 72.5 (L) 65.8 - 55.0 %   MCV 96.1 (H) 79.4 - 94.8 fL   MCH 29.8 25.6 - 32.2 pg   MCHC 31.0 (L) 32.2 - 35.5 gm/dL   Plt Ct 654 849 - 599 thou/mcL   RDW SD 52.8 (H) 35.1 - 46.3 fL   MPV 10.0 9.4 - 12.4 fL   NRBC% 0.0 0.0 - 0.2 /100WBC   Absolute NRBC Count 0.00 0.00 - 0.01 thou/mcL   NEUTROPHIL % 73.5 %   LYMPHOCYTE % 14.8 %   MONOCYTE % 8.2 %   Eosinophil % 2.3 %   BASOPHIL % 0.4 %   IG% 0.8 %   ABSOLUTE NEUTROPHIL COUNT 5.45 1.50 - 7.50 thou/mcL   ABSOLUTE LYMPHOCYTE COUNT 1.10 1.00 - 4.50 thou/mcL   Absolute Monocyte Count 0.61 0.10 - 0.80 thou/mcL   Absolute Eosinophil Count 0.17 0.00 - 0.50 thou/mcL   Absolute Basophil Count 0.03 0.00 - 0.20 thou/mcL   Absolute Immature Granulocyte Count 0.06 (H) 0.00 - 0.03 thou/mcL   Comprehensive Metabolic Panel   Collection Time: 04/02/24 11:05 AM  Result Value Ref Range   Na 140 136 - 146 mmol/L   Potassium 3.9 3.7 - 5.4 mmol/L   Cl 103 97 - 108 mmol/L   CO2 26 20 - 32 mmol/L   AGAP 11 7 - 16 mmol/L   Glucose 107 (H) 65 - 99 mg/dL   BUN 15 8 - 27 mg/dL   Creatinine 9.28 9.42 - 1.00 mg/dL   Ca 9.1 8.6 - 89.7 mg/dL   ALK PHOS 852 25 - 834 U/L   T Bili  1.2 0.0 - 1.2 mg/dL   Total Protein 5.9 (L) 6.0 - 8.5 gm/dL   Alb 3.3 (L) 3.5 - 4.8 gm/dL   GLOBULIN 2.6 1.5 -  4.5 gm/dL   ALBUMIN /GLOBULIN RATIO 1.3 1.1 - 2.5   BUN/CREAT RATIO 21.1 11.0 - 26.0   ALT 10 0 - 40 U/L   AST 16 0 - 40 U/L   eGFR 89 >=60 mL/min/1.57m2  Gen5 Cardiac Troponin T (TnT5) Baseline Series at: baseline, 1 hour, and 3 hour; Onset of symptoms: Less than 3 hours or undetermined   Collection Time: 04/02/24 11:05 AM  Result Value Ref Range   TnT-Gen5 (0hr) 14 (H) <14 ng/L  NT-proBNP   Collection Time: 04/02/24 11:05 AM  Result Value Ref Range   NT-ProBNP 3,588 (H) <=899 pg/mL  Gen5 Cardiac Troponin T (TnT5) 1H   Collection Time: 04/02/24 12:02 PM  Result Value Ref Range   TnT-Gen5 (1hr) 17 (H) <14 ng/L   Delta 1 Hour 3 <5 ng/L  Gen5 Cardiac Troponin T (TnT5) 3H   Collection Time: 04/02/24  2:02 PM  Result Value Ref Range   TnT-Gen5 (3hr) 15 (H) <14 ng/L   Delta 3 Hour 1 <7 ng/L    Imaging: XR Chest Ap Portable Result Date: 04/02/2024 History:  Other: Weakness hypotension, weakness, near syncope, recent hip replacement Comparison:  09/13/2023 Technique: Portable chest Interpretation: The lungs are clear. There is cardiomegaly. Clips project over the right chest.   Impression: No acute findings. Electronically Signed by: Lynwood Portugal on 04/02/2024 11:00 AM   EKG: ECG 12 lead Result Date: 04/02/2024 Diagnosis Class Abnormal Acquisition Device D3K Systolic BP 157 Diastolic BP 67 Ventricular Rate 53 Atrial Rate 53 P-R Interval 206 QRS Duration 86 Q-T Interval 480 QTC Calculation(Bazett) 450 Calculated P Axis 71 Calculated R Axis 50 Calculated T Axis 55 Diagnosis Sinus bradycardia Septal infarct , age undetermined Abnormal ECG When compared with ECG of 02-Apr-2024 12:12, Septal infarct is now present  ECG 12 lead Result Date: 04/02/2024 Diagnosis Class Borderline Abnormal Acquisition Device D3K Systolic BP 150 Diastolic BP 66 Ventricular Rate 57 Atrial Rate 57 P-R  Interval 202 QRS Duration 82 Q-T Interval 436 QTC Calculation(Bazett) 424 Calculated P Axis 84 Calculated R Axis 73 Calculated T Axis 58 Diagnosis Sinus bradycardia Otherwise normal ECG When compared with ECG of 02-Apr-2024 09:47, No significant change was found  ECG 12 lead Result Date: 04/02/2024 Diagnosis Class Borderline Abnormal Acquisition Device D3K Systolic BP 110 Diastolic BP 56 Ventricular Rate 52 Atrial Rate 52 P-R Interval 194 QRS Duration 98 Q-T Interval 466 QTC Calculation(Bazett) 433 Calculated P Axis 78 Calculated R Axis 69 Calculated T Axis 56 Diagnosis Sinus bradycardia Otherwise normal ECG When compared with ECG of 30-Oct-2023 12:44, Sinus rhythm has replaced Ectopic atrial rhythm Nonspecific T wave abnormality no longer evident in Inferior leads Nonspecific T wave abnormality, improved in Anterolateral leads    Time spent on this admission: 75 minutes   Balinda GORMAN Short, DO 04/02/2024 3:34 PM      [1] Social History Socioeconomic History  . Marital status: Divorced  Tobacco Use  . Smoking status: Never  . Smokeless tobacco: Never  [2] Allergies Allergen Reactions  . Rivaroxaban  Other    JOINT ACHES/PAIN

## 2024-04-03 DIAGNOSIS — R55 Syncope and collapse: Secondary | ICD-10-CM | POA: Diagnosis not present

## 2024-04-03 DIAGNOSIS — Z0189 Encounter for other specified special examinations: Secondary | ICD-10-CM | POA: Diagnosis not present

## 2024-04-03 NOTE — Progress Notes (Signed)
 Respiratory Evaluation Today's Date: April 03, 2024  Problem List: Problem List[1] Past Medical History: Past Medical History:  Diagnosis Date  . Atrial fibrillation (*)   . Breast cancer (*)     Home Treatment: cpap hs  New Plan of Care: continue cpap hs, no other therapy is indicated at this time.   Re-Evaluation?  no  CPAP @HS  ? yes   RCAT Assessment Type of Assessment: Initial Assessment RCAT Pulmonary History: No history of smoking RCAT Surgical Status: No surgery this admission RCAT Chest x-ray: Clear or NA RCAT Respiratory Pattern: Regular pattern RR 12-20 RCAT Mental Status: Alert, oriented, cooperative RCAT Oxygen: None or patient at home regimen RCAT Breath Sounds: Clear to ascultation RCAT Cough: Strong, spontaneous, non-productive RCAT Level of Activity: Ambulatory RCAT Total Points: 0 RCAT Severity Level: Severity  5 (0-6 points)   Respiratory and Oxygen Assessment Heart Rate: (!) 133 Respiratory (WDL): Within Defined Limits   Imaging Results: XR Chest Ap Portable Result Date: 04/02/2024 History:  Other: Weakness hypotension, weakness, near syncope, recent hip replacement Comparison:  09/13/2023 Technique: Portable chest Interpretation: The lungs are clear. There is cardiomegaly. Clips project over the right chest.   Impression: No acute findings. Electronically Signed by: Lynwood Portugal on 04/02/2024 11:00 AM  VAS US  LOWER EXTREMITY VENOUS (DVT) Result Date: 04/01/2024 This result has an attachment that is not available.  Lower Venous DVT Study Patient Name:  Robin Christensen  Date of Exam:   04/01/2024 Medical Rec #: [id number]           Accession #:    [id number] Date of Birth: Feb 23, 1951           Patient Gender: F Patient Age:   5 years Exam Location:  [address] Procedure:      VAS US  LOWER EXTREMITY VENOUS (DVT) Referring Phys: MAUDE DALLDORF -------------------------------------------------------------------------------- Indications: Pain. Risk  Factors: Surgery Left hip replacement 5 weeks prior obesity and past pregnancy. Limitations: Body habitus. Comparison Study: None. Performing Technologist: Garnette Rockers Examination Guidelines: A complete evaluation includes B-mode imaging, spectral Doppler, color Doppler, and power Doppler as needed of all accessible portions of each vessel. Bilateral testing is considered an integral part of a complete examination. Limited examinations for reoccurring indications may be performed as noted. The reflux portion of the exam is performed with the patient in reverse Trendelenburg. +-----+---------------+---------+-----------+----------+--------------+ RIGHTCompressibilityPhasicitySpontaneityPropertiesThrombus Aging +-----+---------------+---------+-----------+----------+--------------+ CFV  Full           Yes      Yes                                 +-----+---------------+---------+-----------+----------+--------------+ +---------+---------------+---------+-----------+----------+-------------------+ LEFT     CompressibilityPhasicitySpontaneityPropertiesThrombus Aging      +---------+---------------+---------+-----------+----------+-------------------+ CFV      Full           Yes      Yes                                      +---------+---------------+---------+-----------+----------+-------------------+ SFJ      Full                                                             +---------+---------------+---------+-----------+----------+-------------------+  FV Prox  Full                                                             +---------+---------------+---------+-----------+----------+-------------------+ FV Mid   Full                                                             +---------+---------------+---------+-----------+----------+-------------------+ FV Distal                        Yes                  Not well visualized  +---------+---------------+---------+-----------+----------+-------------------+ PFV      Full                                                             +---------+---------------+---------+-----------+----------+-------------------+ POP      Full           Yes      Yes                                      +---------+---------------+---------+-----------+----------+-------------------+ PTV                              Yes                  Not well visualized +---------+---------------+---------+-----------+----------+-------------------+ PERO                                                  Not Visualized      +---------+---------------+---------+-----------+----------+-------------------+ Summary: RIGHT: - No evidence of common femoral vein obstruction. LEFT: - There is no evidence of deep vein thrombosis in the lower extremity. - No cystic structure found in the popliteal fossa. *See table(s) above for measurements and observations. Electronically signed by Gaile New MD on 04/01/2024 at 3:41:46 PM.   Final    Xr Hip 1 View Left Result Date: 03/19/2024 CLINICAL DATA:  Elective surgery. EXAM: DG HIP (WITH OR WITHOUT PELVIS) 1V*L* COMPARISON:  Preoperative imaging FINDINGS: Fourteen fluoroscopic spot views of the pelvis and left hip obtained in the operating room. Sequential images during hip arthroplasty. Fluoroscopy time 23 seconds. Dose 4.05 mGy. IMPRESSION: Intraoperative fluoroscopy during left hip arthroplasty. Electronically Signed   By: Andrea Gasman M.D.   On: 03/19/2024 11:23  XR Chest Pa And Lateral Result Date: 03/08/2024 CLINICAL DATA:  Preop chest exam. Per electronic records, upcoming total hip arthroplasty. EXAM: CHEST - 2 VIEW COMPARISON:  Radiograph 08/07/2018 FINDINGS: Stable heart size and mediastinal contours. The descending aorta is tortuous. No pulmonary edema. No focal airspace disease,  pleural effusion, or pneumothorax. Multiple surgical clips project  over the right chest wall and left upper abdomen. No acute osseous abnormalities. IMPRESSION: No acute chest findings. Electronically Signed   By: Andrea Gasman M.D.   On: 03/08/2024 15:31   Respiratory Therapy will continue to follow.  Nena Null, RRT 04/03/2024 8:25 AM       [1] Patient Active Problem List Diagnosis  . Near syncope  . Essential hypertension  . Hypothyroidism  . Lower extremity edema  . Hyperlipidemia  . Morbid obesity (*)  . Obstructive sleep apnea syndrome  . S/P total left hip arthroplasty  . Urine incontinence  . Vitamin B12 deficiency (non anemic)  . Vitamin D  deficiency  . Atrial fibrillation (*)

## 2024-04-03 NOTE — Progress Notes (Signed)
 [facility] Inpatient Care Specialist Progress Note [facility] Medicine Progress Note  Date of Admission:  04/02/2024 Length of Stay:  0 Days  Assessment/Plan:  Robin Christensen is a 73 y.o. White or Caucasian [1] female with:  Assessment: Principal Problem:   Near syncope Active Problems:   Essential hypertension   Hypothyroidism   Lower extremity edema   Hyperlipidemia   Obstructive sleep apnea syndrome   Atrial fibrillation (*)  Robin Christensen is a 73 y.o. White or Caucasian [1] female with PMHx of OSA, hypothyroidism, a fib, hypertension, s/p hip replacement at [facility] on Sept 2nd who presented to [facility] ED for evaluation of weakness and near syncope with hypotension  Near syncope Possibly secondary to orthostatic hypotension.  Multifactorial in the setting of poor p.o. intake and opioid with muscle relaxant use. - Obtain orthostatic vital - Advised patient on cutting back on her muscle relaxant after discharge  Possible decompensated heart failure Left lower extremity edema Her main symptom was left lower extremity edema which could be affected by her recent hip surgery.  Chest x-ray without signs of pulmonary edema or pleural effusion.  No evidence of renal or liver failure.  DVT was ruled out on recent ultrasound on 9/15 -Her weight was 242 lbs on 9/2 before hip surgery.  - S/p IV Lasix  40 mg BID.  She appears euvolemic on exam right now.  Hold off on additional diuresis. - Pending echocardiogram  Paroxysmal A-fib Her heart rate stays in the 60s without any AV nodal blocker.  Heart rate was elevated in the morning of 9/17 and repeat returned to normal rate. - Continue Eliquis  for secondary stroke prevention   Hyperlipidemia - continue statin    Morbid obesity - affects all aspects of care.    Urine incontinence - continue oxybutynin .    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 - Check TSH    Obstructive sleep apnea syndrome - cpap ordered for nighttime use.    Vitamin B12 deficiency (non anemic) - continue B12 supplements.   Vitamin D  deficiency - continue vitamin d  supplementation   DVT prophylaxis: Eliquis     Fluids, electrolytes, nutrition Fluids: KVO, monitor lytes and replete prn, Diet: Regular Diet 2 gm NA Percent Meals Eaten (%): 100 %  Prophylaxis DVT Pharmacological Prophylaxis (From admission, onward)    None      DVT Mechanical Prophylaxis (From admission, onward)    None         GI:  Not indicated Foley catheter:no Central line:no Telemetry:yes, remains medically necessary     General  Code status: Full code  Surrogate decision maker   PCP: Beverley [employer], MD [phone] Specialists:        Disposition transfer to floor  Discussed plan of care with  nurse and case manager I have discussed the diagnoses and care plan with the patient.     Subjective/Events Overnight:   Patient reports doing well this morning.  She denies chest pain endorses mild shortness of breath without abdominal pain.  She reports feeling lightheaded and when she was going to her PT session.  States that her systolic blood pressure was as low as 70.  States that she did not eat or drink that day and did take 1 dose of muscle relaxant before.  She is also concerned about heart failure and swelling of her left leg.  States that she just had a  left hip surgery about 2 weeks ago.  Objective   Vital signs in last 24 hours: Intake/Output:  Temp:  [97.9 F (36.6 C)-99.1 F (37.3 C)] 98.3 F (36.8 C) Heart Rate:  [51-136] 69 Resp:  [12-25] 18 BP: (83-165)/(48-97) 155/63 SpO2:  [82 %-99 %] 98 %    Wt Readings from Last 1 Encounters:  04/03/24 117.9 kg (260 lb)    Weight change:   Intake/Output Summary (Last 24 hours) at 04/03/2024 1411 Last data filed at 04/03/2024 1303 Gross per 24 hour  Intake 480 ml   Output 451 ml  Net 29 ml         Physical Exam   Constitutional - resting comfortably Eyes - EOM intact Nose - no gross deformity or drainage    CV - (+)S1S2, no murmurs, trace peripheral edema, no JVD Resp - CTA bilaterally, no wheezing or crackles; normal effort. On RA GI - (+)BS, soft, non-tender, non-distended MSK- ROM normal  Skin - no rashes  Psych - normal affect and mood    Recent Labs    Units 04/03/24 0228  WBC thou/mcL 7.2  HGB gm/dL 9.4*  HCT % 69.8*  PLT thou/mcL 351   Recent Labs    Units 04/03/24 0228  NA mmol/L 141  K mmol/L 3.1*  CL mmol/L 101  CO2 mmol/L 28  BUN mg/dL 13  CREATININE mg/dL 9.39  CALCIUM  mg/dL 8.8   No results for input(s): MAGNESIUM  in the last 168 hours. No results for input(s): TSH, T4, HGBA1C in the last 168 hours. Recent Labs    Units 04/02/24 1105  BILITOT mg/dL 1.2  AST U/L 16  ALT U/L 10  ALKPHOS U/L 147  ALBUMIN  gm/dL 3.3*   No results for input(s): LABPROT, INR, PTT in the last 168 hours. No results for input(s): CHOL, LDL, HDL, TRIG in the last 168 hours. Recent Labs    Units 04/03/24 0228 04/02/24 1105  GLUCOSE mg/dL 95 892*   No results for input(s): TROPONIN, CK in the last 168 hours.  Invalid input(s): CK-MB Recent Labs    Units 04/02/24 1105  BNP pg/mL 3,588*      . apixaban   5 mg Oral BID  . calcium  carbonate-vitamin D   2 tablet Oral Daily  . [Held by Provider] furosemide   40 mg IntraVENous BID  . gabapentin   300 mg Oral TID  . levothyroxine  sodium  75 mcg Oral Daily  . NaCl  10 mL Intracatheter Q12H SCH  . oxybutynin   10 mg Oral Daily  . rosuvastatin  calcium   10 mg Oral Daily  . vitamin B-12  1,000 mcg Oral Daily   . NaCl     acetaminophen  **OR** acetaminophen , acetaminophen  **OR** acetaminophen , albuterol sulfate, guaiFENesin, hydrALAZINE HCl, NaCl, NaCl **AND** NaCl, nitroGLYCERIN, ondansetron  **OR** ondansetron , oxyCODONE  HCl, oxyCODONE  HCl,  polyethylene glycol    Documentation for time-based billing:  Total time spent of date of service was 50 minutes.  Patient care activities included preparing to see the patient such as reviewing the patient record, obtaining and/or reviewing separately obtained history, performing a medically appropriate history and physical examination, counseling and educating the patient, family, and/or caregiver, ordering prescription medications, tests, or procedures, referring and communicating with other health care providers when not separately reported during the visit, documenting clinical information in the electronic or other health record, independently interpreting results when not separately reported, communicating results to the patient/family/caregiver, and coordinating the care of the patient when not separately reported.     Raiford DASEN  Nguyen, DO 04/03/2024 2:11 PM

## 2024-04-03 NOTE — Care Plan (Signed)
  Problem: Deep Venous Thrombosis, Risk of Goal: Absence of deep venous thrombosis Outcome: Progressing   Problem: Bleeding, Risk of Goal: Absence of active bleeding Outcome: Progressing Goal: Absence of impaired coagulation signs and symptoms Outcome: Progressing

## 2024-04-03 NOTE — Care Plan (Signed)
  Problem: Deep Venous Thrombosis, Risk of Goal: Absence of deep venous thrombosis Outcome: Progressing   Problem: Bleeding, Risk of Goal: Absence of active bleeding Outcome: Progressing Goal: Absence of impaired coagulation signs and symptoms Outcome: Progressing   Problem: Fall Prevention Goal: Absence of falls Outcome: Progressing

## 2024-04-03 NOTE — Progress Notes (Signed)
 Patient or surrogate decision-maker refused VPAP as ordered by the Dorothey Oetken. The risks, benefits, and any alternative device/therapy (if applicable) was discussed with the patient or surrogate decision-maker.  Patient or surrogate decision-maker appeared to comprehend the decision: Yes. Caroll Cunnington notified: No.

## 2024-04-04 ENCOUNTER — Ambulatory Visit: Payer: Medicare (Managed Care) | Admitting: Physical Therapy

## 2024-04-04 DIAGNOSIS — R55 Syncope and collapse: Secondary | ICD-10-CM | POA: Diagnosis not present

## 2024-04-04 NOTE — Progress Notes (Signed)
 Heart Failure Navigator Note  DATE OF ADMISSION:04/02/2024  ADMITTING DIAGNOSIS:Near syncope [R55]   CARDIOLOGIST:  [place] ERE:[employer], MD   Consult received prior to echo. After pt's echo done, spoke to Dr. Leontine, the Left LE swelling is related to hip surgery. Fluid overload is not heart failure related.   HF consult cleared. Pt will not be seen for HF education. Pt does not have a history of HF.    Time spent in chart review/Heart Failure education/Care coordination:45 minutes  Sonya R Riling, RN, BSN, MLS Heart Failure Nurse Navigator

## 2024-04-04 NOTE — Discharge Summary (Signed)
 [facility]  Discharge Summary  PCP: Beverley [employer], MD Discharge Details   Admit date:         04/02/2024 Discharge date and time:       04/04/2024 Hospital LOS:    3  days  Active Hospital Problems   Diagnosis Date Noted POA  . *Near syncope 04/02/2024 Yes  . Hyperlipidemia 04/02/2024 Yes  . Hypothyroidism 02/06/2024 Yes  . Obstructive sleep apnea syndrome 02/06/2024 Yes  . Atrial fibrillation (*) 04/23/2020 Yes  . Essential hypertension 03/12/2020 Yes  . Lower extremity edema 01/14/2020 Yes    Resolved Hospital Problems  No resolved problems to display.      Current Discharge Medication List     CONTINUE these medications which have CHANGED      Details  furosemide  40 mg tablet Commonly known as: LASIX  What changed:  when to take this reasons to take this  Take one tablet (40 mg dose) by mouth daily as needed.       CONTINUE these medications which have NOT CHANGED      Details  apixaban  5 mg tablet Commonly known as: ELIQUIS   Take one tablet (5 mg dose) by mouth 2 (two) times daily.   Calcium  Carb-Cholecalciferol 600-10 MG-MCG Chew  Chew 2 tablets by mouth daily.   cholecalciferol 1,000 units (25 mcg) tablet Commonly known as: VITAMIN D -3  Take one tablet (1,000 Units dose) by mouth daily.   gabapentin  300 mg capsule Commonly known as: NEURONTIN   Take one capsule (300 mg dose) by mouth 3 (three) times a day.   HYDROcodone -acetaminophen  5-325 mg per tablet Commonly known as: NORCO  Take one tablet to two tablets by mouth every 6 (six) hours as needed.   levothyroxine  sodium 75 mcg tablet Commonly known as: SYNTHROID ,LEVOTHROID,LEVOXYL   Take one tablet (75 mcg dose) by mouth daily.   magnesium  oxide 400 Tabs Commonly known as: MAG-OX  Take one tablet (400 mg dose) by mouth daily.   ondansetron  4 mg disintegrating tablet Commonly known as: ZOFRAN -ODT  Take one tablet (4 mg dose) by  mouth every 6 (six) hours as needed. Quantity: 20 tablet   oxybutynin  10 MG 24 hr tablet Commonly known as: DITROPAN -XL  Take one tablet (10 mg dose) by mouth daily.   promethazine  12.5 MG tablet Commonly known as: PHENERGAN   Take one tablet to two tablets (12.5-25 mg dose) by mouth every 6 (six) hours as needed.   rosuvastatin  calcium  10 mg tablet Commonly known as: CRESTOR   Take one tablet (10 mg dose) by mouth daily.   tiZANidine  4 mg tablet Commonly known as: ZANAFLEX   Take one tablet (4 mg dose) by mouth every 6 (six) hours as needed.   vitamin B-12 1000 mcg tablet Commonly known as: CYANOCOBALAMIN   Take one tablet (1,000 mcg dose) by mouth daily.   zonisamide  25 mg capsule Commonly known as: ZONEGRAN   Take three capsules (75 mg dose) by mouth daily.      * You might also be taking other medications not listed above. If you have questions about any of your other medications, talk to the person who prescribed them or your Primary Care Provider.          STOP taking these medications    baclofen  10 mg tablet Commonly known as: LIORESAL    Biotin 5000 MCG Caps   Magnesium  250 MG Tabs   sertraline  25 mg tablet Commonly known as: ZOLOFT    sertraline  50 mg  tablet Commonly known as: ZOLOFT          Reason for medication changes: details below   Hospital Course   Indication for Admission/chief complaint: lightheadedness, near syncope   History of Present Illness:    Robin Christensen is a 73 y.o. White or Caucasian [1] female with PMHx of OSA, hypothyroidism, a fib, hypertension, s/p hip replacement at [facility] on Sept 2nd who presented to [facility] ED for evaluation of weakness. Patient notes that on the way to PT and while at PT she was feeling lightheaded and as if she was going to pass out, having difficulty holding herself up. Staff checked her BP and noticed that she was roughly 70/50 so they recommended she present to the emergency department. States she  has not had complications or symptoms up to this point. She did have LE doppler ultrasound which did not show any evidence of clots per the patient. She states they had a low suspicion for DVT given she is compliant with her Eliquis  and has been on it for many years for her A-fib. She denies chest pain or shortness of breath at this time but continues to have some generalized weakness. States that she has restarted her Lasix  recently but she was out of it for a couple months. She has noticed some more swelling of her lower extremities. Weight is up from baseline 240 lbs to 267 today.    Reviewed OP cardiology visit from yesterday.    In ED, vital signs notable for elevated BP of 165/71. Lab evaluation notable for elevated BNP of 3588. Initial troponin T was high at 14 with 1 hr repeat of 17 and delta 1 hr of 3. CBC showed low hemoglobin of 8.5 with hematocrit of 27.4. CXR with no acute findings. ECG showed sinus bradycardia. Pt will be admitted to med/tele floor for further workup and treatment.  (From H&P)   Hospital Course:        Robin Christensen is a 73 y.o. White or Caucasian [1] female with PMHx of OSA, hypothyroidism, a fib, hypertension, s/p hip replacement at [facility] on Sept 2nd who presented to [facility] ED for evaluation of weakness and near syncope with hypotension   Near syncope Possibly secondary to orthostatic hypotension.  Multifactorial in the setting of poor p.o. intake and opioid with muscle relaxant use.  Orthostatic vitals on 9/18 was negative.  Patient denies any symptoms with vitals checked. Advised patient on cutting back on her muscle relaxant after discharge   Left lower extremity edema Her main symptom was left lower extremity edema which could be affected by her recent hip surgery.  Chest x-ray without signs of pulmonary edema or pleural effusion.  No evidence of renal or liver failure.  DVT was ruled out on recent ultrasound on 9/15.  TTE showed normal EF 60-65%, normal  diastolic function and normal RV function.  No significant valvular disease noted.  I do not think that patient has heart failure.  Her left lower extremity edema is likely due to her recent hip surgery.  Her weight was 251 lbs at discharge.  Patient will continue her Lasix  40 mg as needed at home for worsening lower extremity swelling.  Patient will follow-up with her cardiologist outpatient   Paroxysmal A-fib Her heart rate stays in the 60s without any AV nodal blocker.  Heart rate was elevated in the morning of 9/17 and repeat returned to normal rate. Continue Eliquis  for secondary stroke prevention   Hyperlipidemia - continue statin  Morbid obesity - affects all aspects of care.    Urine incontinence - continue oxybutynin .    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   Obstructive sleep apnea syndrome - cpap ordered for nighttime use.    Vitamin B12 deficiency (non anemic) - continue B12 supplements.   Vitamin D  deficiency - continue vitamin d  supplementation   Recommendations to physicians/followup needed:  - Follow-up with PCP in 1 week - Repeat CBC and BMP at follow-up - Follow-up with cardiology outpatient  Subjective Patient reports doing well this morning.  She denies any palpitation, chest pain, shortness of breath or abdominal pain.  Denies any symptoms with orthostatic vital checked this morning.  Patient states that she follows with a cardiologist for A-fib and she will schedule an appointment to follow-up with them.  States that she no longer takes Zoloft .  Physical Exam: BP (!) 138/93 (BP Location: Right Lower Arm, Patient Position: Standing)   Pulse 82   Temp 98.5 F (36.9 C) (Oral)   Resp 20   Ht 1.727 m (5' 8)   Wt 114 kg (251 lb 4.8 oz)   SpO2 98%   BMI 38.21 kg/m   Wt Readings from Last 1 Encounters:  04/04/24 114 kg (251 lb 4.8 oz)    Physical Exam Constitutional:      General:  She is not in acute distress.    Appearance: She is not ill-appearing.  HENT:     Head: Normocephalic.  Eyes:     General:        Right eye: No discharge.        Left eye: No discharge.     Conjunctiva/sclera: Conjunctivae normal.  Cardiovascular:     Rate and Rhythm: Normal rate and regular rhythm.     Heart sounds: Normal heart sounds. No murmur heard.    Comments: Trace lower extremity edema on the left side Musculoskeletal:     Cervical back: Normal range of motion.  Pulmonary:     Effort: Pulmonary effort is normal. No respiratory distress.     Breath sounds: Normal breath sounds. No wheezing.  Abdominal:     General: Bowel sounds are normal. There is no distension.     Palpations: Abdomen is soft.     Tenderness: There is no abdominal tenderness.  Skin:    General: Skin is warm.  Neurological:     Mental Status: She is alert. Mental status is at baseline.  Psychiatric:        Mood and Affect: Mood normal.     Labs on Discharge:  Recent Labs    Units 04/04/24 0205 04/03/24 0228 04/02/24 1105  WBC thou/mcL 7.1 7.2 7.4  HGB gm/dL 9.2* 9.4* 8.5*  HCT % 70.1* 30.1* 27.4*  PLT thou/mcL 349 351 345   Recent Labs    Units 04/04/24 0205 04/03/24 0228 04/02/24 1105  NA mmol/L 138 141 140  K mmol/L 3.2* 3.1* 3.9  CL mmol/L 101 101 103  CO2 mmol/L 28 28 26   BUN mg/dL 17 13 15   CREATININE mg/dL 9.39 9.39 9.28  CALCIUM  mg/dL 9.1 8.8 9.1   Recent Labs    Units 04/02/24 1105  BILITOT mg/dL 1.2  AST U/L 16  ALT U/L 10  ALKPHOS U/L 147  ALBUMIN  gm/dL 3.3*   No results for input(s): TSH, HGBA1C in the last 168 hours. No results for input(s): LABPROT, INR, PTT in the last 168 hours. No results  for input(s): CHOL, LDL, HDL, TRIG in the last 168 hours. No results for input(s): TROPONIN, CK in the last 168 hours.  Invalid input(s): CK-MB  Diagnostics:   Echocardiogram Complete W Enhancing Agent Result Date: 04/03/2024 .  Left  Ventricle: EF: 60-65%.   XR Chest Ap Portable Result Date: 04/02/2024 History:  Other: Weakness hypotension, weakness, near syncope, recent hip replacement Comparison:  09/13/2023 Technique: Portable chest Interpretation: The lungs are clear. There is cardiomegaly. Clips project over the right chest.   Impression: No acute findings. Electronically Signed by: Lynwood Portugal on 04/02/2024 11:00 AM  VAS US  LOWER EXTREMITY VENOUS (DVT) Result Date: 04/01/2024 This result has an attachment that is not available.  Lower Venous DVT Study Patient Name:  Robin Christensen  Date of Exam:   04/01/2024 Medical Rec #: [id number]           Accession #:    [id number] Date of Birth: 12/30/1950           Patient Gender: F Patient Age:   77 years Exam Location:  [address] Procedure:      VAS US  LOWER EXTREMITY VENOUS (DVT) Referring Phys: MAUDE DALLDORF -------------------------------------------------------------------------------- Indications: Pain. Risk Factors: Surgery Left hip replacement 5 weeks prior obesity and past pregnancy. Limitations: Body habitus. Comparison Study: None. Performing Technologist: Garnette Rockers Examination Guidelines: A complete evaluation includes B-mode imaging, spectral Doppler, color Doppler, and power Doppler as needed of all accessible portions of each vessel. Bilateral testing is considered an integral part of a complete examination. Limited examinations for reoccurring indications may be performed as noted. The reflux portion of the exam is performed with the patient in reverse Trendelenburg. +-----+---------------+---------+-----------+----------+--------------+ RIGHTCompressibilityPhasicitySpontaneityPropertiesThrombus Aging +-----+---------------+---------+-----------+----------+--------------+ CFV  Full           Yes      Yes                                 +-----+---------------+---------+-----------+----------+--------------+  +---------+---------------+---------+-----------+----------+-------------------+ LEFT     CompressibilityPhasicitySpontaneityPropertiesThrombus Aging      +---------+---------------+---------+-----------+----------+-------------------+ CFV      Full           Yes      Yes                                      +---------+---------------+---------+-----------+----------+-------------------+ SFJ      Full                                                             +---------+---------------+---------+-----------+----------+-------------------+ FV Prox  Full                                                             +---------+---------------+---------+-----------+----------+-------------------+ FV Mid   Full                                                             +---------+---------------+---------+-----------+----------+-------------------+  FV Distal                        Yes                  Not well visualized +---------+---------------+---------+-----------+----------+-------------------+ PFV      Full                                                             +---------+---------------+---------+-----------+----------+-------------------+ POP      Full           Yes      Yes                                      +---------+---------------+---------+-----------+----------+-------------------+ PTV                              Yes                  Not well visualized +---------+---------------+---------+-----------+----------+-------------------+ PERO                                                  Not Visualized      +---------+---------------+---------+-----------+----------+-------------------+ Summary: RIGHT: - No evidence of common femoral vein obstruction. LEFT: - There is no evidence of deep vein thrombosis in the lower extremity. - No cystic structure found in the popliteal fossa. *See table(s) above for measurements and  observations. Electronically signed by Gaile New MD on 04/01/2024 at 3:41:46 PM.   Final       Post Hospital Care   Discharge Procedure Orders  Notify Physician for swelling in your ankles, feet, hands, neck or face (Heart Failure)   Notify Physician for palpitations (Fluttering in your chest)   Notify Physician for nausea, vomiting, or sweating combined with chest pain   Notify Physician for increase shortness of breath with activity or at rest   Notify Physician for chest pain / heart pain / chest heaviness   Notify Physician for dizziness when trying to stand   Notify Physician for Weight gain  Order Comments: 2-3 lbs overnight, 5 lbs in 5 days   Activity as tolerated   Discharge instructions  Order Comments: - Please follow-up with your primary care doctor in 1 week for repeat blood work - Please follow-up with your cardiologist outpatient - You can take Lasix  as needed for any signs of worsening swelling of your left leg - Please continue checking your blood pressure at home and avoid using muscle relaxant or pain medication if your blood pressure is low    Diet: Regular Diet 2 gm NA  Potential for Rehab:        Good Code Status:   Full Code Disposition: Home Consults: NA  Followup appointments: No future appointments.   Time spent in discharge process:  total time spent 60 minutes   Electronically signed: Raiford ONEIDA Miyamoto, DO 04/04/2024 / 12:19 PM   *Some images could not be shown.

## 2024-04-09 ENCOUNTER — Encounter: Payer: Self-pay | Admitting: Physical Therapy

## 2024-04-09 ENCOUNTER — Ambulatory Visit: Payer: Medicare (Managed Care) | Admitting: Physical Therapy

## 2024-04-09 DIAGNOSIS — R262 Difficulty in walking, not elsewhere classified: Secondary | ICD-10-CM

## 2024-04-09 DIAGNOSIS — Z96642 Presence of left artificial hip joint: Secondary | ICD-10-CM

## 2024-04-09 DIAGNOSIS — M6281 Muscle weakness (generalized): Secondary | ICD-10-CM

## 2024-04-09 NOTE — Therapy (Signed)
 OUTPATIENT PHYSICAL THERAPY TREATMENT    Patient Name: Robin Christensen MRN: [id number] DOB:08/13/1950, 73 y.o., female Today's Date: 04/09/2024  END OF SESSION:  PT End of Session - 04/09/24 1021     Visit Number 2    Number of Visits 16    Date for Recertification  05/22/24    Authorization Type [employer]age    Progress Note Due on Visit 10    PT Start Time 0930    PT Stop Time 1010    PT Time Calculation (min) 40 min    Activity Tolerance Patient tolerated treatment well    Behavior During Therapy WFL for tasks assessed/performed            Past Medical History:  Diagnosis Date   A-fib (HCC)    Arthritis    B12 deficiency    Back pain    Breast cancer (HCC)    Cancer (HCC)    Right Breast Cancer   Chronic headaches    Dysrhythmia    AF   Edema, lower extremity    Gallbladder disease    GERD (gastroesophageal reflux disease)    Hyperlipidemia    Hypertension    Hypothyroidism    Joint pain    Obesity    Pneumonia    PONV (postoperative nausea and vomiting)    Sleep apnea    cpcap   Vitamin deficiency    Past Surgical History:  Procedure Laterality Date   BREAST SURGERY     CARDIOVERSION N/A 06/26/2014   Procedure: CARDIOVERSION;  Surgeon: Erick JONELLE Bergamo, MD;  Location: [facility] ENDOSCOPY;  Service: Cardiovascular;  Laterality: N/A;   CARDIOVERSION  09/07/2023   done at [facility]   CATARACT EXTRACTION Bilateral    CHOLECYSTECTOMY     DILATION AND CURETTAGE OF UTERUS     eyelid surgery     TOTAL HIP ARTHROPLASTY Left 03/19/2024   Procedure: ARTHROPLASTY, HIP, TOTAL, ANTERIOR APPROACH;  Surgeon: Sheril Coy, MD;  Location: [facility] ORS;  Service: Orthopedics;  Laterality: Left;   TOTAL KNEE ARTHROPLASTY Right 05/03/2016   Procedure: TOTAL KNEE ARTHROPLASTY;  Surgeon: Coy Sheril, MD;  Location: [facility] OR;  Service: Orthopedics;  Laterality: Right;   TOTAL KNEE ARTHROPLASTY Left 08/14/2018   Procedure: TOTAL KNEE ARTHROPLASTY;   Surgeon: Sheril Coy, MD;  Location: [facility] OR;  Service: Orthopedics;  Laterality: Left;   Patient Active Problem List   Diagnosis Date Noted   Primary localized osteoarthritis of left hip 03/19/2024   S/P total left hip arthroplasty 03/19/2024   Acquired thrombophilia 02/06/2024   Hardening of the aorta (main artery of the heart) 02/06/2024   Hypothyroidism 02/06/2024   Idiopathic sleep related nonobstructive alveolar hypoventilation 02/06/2024   Long term current use of anticoagulant therapy 02/06/2024   Obstructive sleep apnea syndrome 02/06/2024   S/P knee replacement 02/06/2024   Vitamin B12 deficiency (non anemic) 02/06/2024   Other fatigue increased 06/01/2020   Atrial fibrillation (HCC) 04/23/2020   Vitamin D  deficiency 04/08/2020   Hyperglycemia 03/12/2020   Essential hypertension 03/12/2020   Mixed hyperlipidemia 01/14/2020   Lower extremity edema 01/14/2020   Depression 01/14/2020   Class 1 obesity with serious comorbidity and body mass index (BMI) of 34.0 to 34.9 in adult 01/14/2020   Primary osteoarthritis of left knee 08/14/2018   Primary localized osteoarthritis of right knee 05/03/2016   Primary osteoarthritis of right knee 05/03/2016   Abnormal auditory perception of both ears 12/01/2015    PCP: Kip Righter  REFERRING PROVIDER:  Dalldorf, Peter  REFERRING DIAG: s/p Lt THA  THERAPY DIAG:  Status post left hip replacement  Muscle weakness (generalized)  Difficulty in walking, not elsewhere classified  Rationale for Evaluation and Treatment: Rehabilitation  ONSET DATE: 03/19/24  SUBJECTIVE:   Per eval: Pt is s/p Lt THA on 03/19/24. She ended up spending 3 days in the hospital due to blood pressure issues. She got home on 03/22/24. She ambulates into clinic with RW. She states she has had severe Lt hip pain. She states pain is all the time, pain increases with sit to stand and lifting Lt LE. Pain eases with meds.  SUBJECTIVE STATEMENT: 04/09/2024:Pt  arrives to clinic ambulating with SPC. She states she feels much better since hospitalization. Pt was hospitalized for BP issues. Per MD d/c summary pt to continue PT.  PERTINENT HISTORY: Bilat TKA, afib PAIN:  Are you having pain? 04/09/2024 deferred today, see assessment below  Per eval:  Yes: NPRS scale: 2/10 currently, up to 9/10 at worst Pain location: Lt hip Pain description: severe Aggravating factors: transfers, lifting LT LE Relieving factors: meds  PRECAUTIONS: Fall  RED FLAGS: None   WEIGHT BEARING RESTRICTIONS: No  FALLS:  Has patient fallen in last 6 months? No  LIVING ENVIRONMENT: Lives with: lives alone Lives in: House/apartment Stairs: 1/2 step to enter Has following equipment at home: Environmental consultant - 2 wheeled  OCCUPATION: retired, enjoys walking  PLOF: Independent  PATIENT GOALS: walk without RW, decrease pain, drive  NEXT MD VISIT: TBD  OBJECTIVE:  Note: Objective measures were completed at Evaluation unless otherwise noted.  PATIENT SURVEYS:  LEFS 17/80    EDEMA:  Edema Lt LE as expected post surgery    POSTURE: weight shift right  PALPATION: Bandage intact over incision  LOWER EXTREMITY ROM:  Lt hip ROM with deficits as expected s/p Lt THA  LOWER EXTREMITY MMT:  MMT Right eval Left eval  Hip flexion  3-  Hip extension    Hip abduction    Hip adduction    Hip internal rotation    Hip external rotation    Knee flexion  3  Knee extension  3  Ankle dorsiflexion  3+  Ankle plantarflexion    Ankle inversion    Ankle eversion     (Blank rows = not tested)   FUNCTIONAL TESTS:  30 seconds chair stand test = 1 with UE support 3 minute walk test (04/09/24): 272' GAIT: Distance walked: 67' Assistive device utilized: Environmental consultant - 2 wheeled Level of assistance: Modified independence Comments: pt able to perform step through pattern, decreased stance time on Lt                                                                                                                                 TREATMENT DATE:  [facility] Adult PT Treatment:  DATE: 04/09/24 Therapeutic Exercise/Activity: Standing Hip abd 2 x 10 Hip ext 2 x 10 HS curl 2 x 10 Hip flex 2 x 10 Step ups 6'' x 5 with 2 UE support Tandem stance 2 x 30 sec intermittent UE support 3 minute walk with SPC = 272' Seated LAQ red TB Hip abd red TB   [facility] Adult PT Treatment:                                                DATE: 04/02/24 Treatment deferred, see assessment   03/27/24 See HEP PT educated pt on HEP, PT POC and goals, usual progression of treatment    PATIENT EDUCATION:  Education details: PT POC and goals, HEP Person educated: Patient Education method: Explanation, Demonstration, and Handouts Education comprehension: verbalized understanding and returned demonstration  HOME EXERCISE PROGRAM: Access Code: [email or web address] URL: [email or web address] Date: 04/09/2024 Prepared by: Darice Conine  Exercises - Standing Hip Abduction with Counter Support  - 1 x daily - 7 x weekly - 3 sets - 10 reps - Standing Hip Extension with Counter Support  - 1 x daily - 7 x weekly - 3 sets - 10 reps - Standing March with Counter Support  - 1 x daily - 7 x weekly - 3 sets - 10 reps - Supine Hip Abduction  - 1 x daily - 7 x weekly - 3 sets - 10 reps - Supine Heel Slide with Strap  - 1 x daily - 7 x weekly - 3 sets - 10 reps - Seated Long Arc Quad  - 1 x daily - 7 x weekly - 3 sets - 10 reps - Standing Tandem Balance with Counter Support  - 1 x daily - 7 x weekly - 1 sets - 3 reps - 30 seconds hold  ASSESSMENT:  CLINICAL IMPRESSION: 04/09/2024:Pt feeling much better since recent hospitalization. She is able to perform standing exercises today with frequent seated rest breaks. Able to complete full 3 minute walk test but requires prolonged seated rest afterwards. She has been checking BP daily at home and states her BP was  147/71 prior to PT visit. Pt with no increased symptoms throughout treatment.  Per eval: Patient is a 73 y.o. female who was seen today for physical therapy evaluation and treatment for Lt THA on 03/19/24. She had complications post op and was d/c'd from the hospital 03/22/24. She presents with impaired gait and functional mobility, impaired strength and balance and will benefit from skilled PT to address deficits and improve functional mobility and independence.   OBJECTIVE IMPAIRMENTS: Abnormal gait, decreased activity tolerance, decreased balance, decreased mobility, difficulty walking, decreased ROM, decreased strength, increased edema, increased muscle spasms, and pain.     GOALS: Goals reviewed with patient? Yes  SHORT TERM GOALS: Target date: 04/24/2024   Pt will be independent with initial HEP Baseline: Goal status: INITIAL  2.  Pt will improve Lt LE strength to 4/5 to improve standing and walking tolerance Baseline:  Goal status: INITIAL    LONG TERM GOALS: Target date: 05/22/2024   Pt will be independent with advanced HEP Baseline:  Goal status: INITIAL  2.  Pt will improve LEFS to >= 27/80 to demo improved functional mobility Baseline:  Goal status: INITIAL  3.  Pt will perform car transfers without use of leg lifter strap Baseline:  Goal status: INITIAL  4.  Pt will walk x 150' without AD with pain <= 2/10 Baseline:  Goal status: INITIAL     PLAN:  PT FREQUENCY: 2x/week  PT DURATION: 8 weeks  PLANNED INTERVENTIONS: 02835- PT Re-evaluation, 97110-Therapeutic exercises, 97530- Therapeutic activity, 97112- Neuromuscular re-education, 97535- Self Care, 02859- Manual therapy, [phone]- Gait training, [phone]- Aquatic Therapy, [phone]- Electrical stimulation (unattended), [phone] (1-2 muscles), [phone] (3+ muscles)- Dry Needling, Patient/Family education, Balance training, Stair training, Taping, Cryotherapy, and Moist heat  PLAN FOR NEXT SESSION: Functional mobility,  gait/balance as able/appropriate    Darice Conine, PT,DPT09/23/2510:22 AM

## 2024-04-11 ENCOUNTER — Ambulatory Visit: Payer: Medicare (Managed Care) | Admitting: Physical Therapy

## 2024-04-11 ENCOUNTER — Encounter: Payer: Self-pay | Admitting: Physical Therapy

## 2024-04-11 DIAGNOSIS — Z96642 Presence of left artificial hip joint: Secondary | ICD-10-CM

## 2024-04-11 DIAGNOSIS — M6281 Muscle weakness (generalized): Secondary | ICD-10-CM

## 2024-04-11 DIAGNOSIS — R262 Difficulty in walking, not elsewhere classified: Secondary | ICD-10-CM

## 2024-04-11 NOTE — Therapy (Signed)
 OUTPATIENT PHYSICAL THERAPY TREATMENT    Patient Name: Robin Christensen MRN: [id number] DOB:February 23, 1951, 73 y.o., female Today's Date: 04/11/2024  END OF SESSION:  PT End of Session - 04/11/24 1056     Visit Number 3    Number of Visits 16    Date for Recertification  05/22/24    Authorization Type [employer] Advantage    Progress Note Due on Visit 10    PT Start Time 1058    PT Stop Time 1140    PT Time Calculation (min) 42 min             Past Medical History:  Diagnosis Date   A-fib (HCC)    Arthritis    B12 deficiency    Back pain    Breast cancer (HCC)    Cancer (HCC)    Right Breast Cancer   Chronic headaches    Dysrhythmia    AF   Edema, lower extremity    Gallbladder disease    GERD (gastroesophageal reflux disease)    Hyperlipidemia    Hypertension    Hypothyroidism    Joint pain    Obesity    Pneumonia    PONV (postoperative nausea and vomiting)    Sleep apnea    cpcap   Vitamin deficiency    Past Surgical History:  Procedure Laterality Date   BREAST SURGERY     CARDIOVERSION N/A 06/26/2014   Procedure: CARDIOVERSION;  Surgeon: Erick JONELLE Bergamo, MD;  Location: [facility] ENDOSCOPY;  Service: Cardiovascular;  Laterality: N/A;   CARDIOVERSION  09/07/2023   done at [facility]   CATARACT EXTRACTION Bilateral    CHOLECYSTECTOMY     DILATION AND CURETTAGE OF UTERUS     eyelid surgery     TOTAL HIP ARTHROPLASTY Left 03/19/2024   Procedure: ARTHROPLASTY, HIP, TOTAL, ANTERIOR APPROACH;  Surgeon: Sheril Coy, MD;  Location: [facility] ORS;  Service: Orthopedics;  Laterality: Left;   TOTAL KNEE ARTHROPLASTY Right 05/03/2016   Procedure: TOTAL KNEE ARTHROPLASTY;  Surgeon: Coy Sheril, MD;  Location: [facility] OR;  Service: Orthopedics;  Laterality: Right;   TOTAL KNEE ARTHROPLASTY Left 08/14/2018   Procedure: TOTAL KNEE ARTHROPLASTY;  Surgeon: Sheril Coy, MD;  Location: [facility] OR;  Service: Orthopedics;  Laterality: Left;   Patient Active Problem List    Diagnosis Date Noted   Primary localized osteoarthritis of left hip 03/19/2024   S/P total left hip arthroplasty 03/19/2024   Acquired thrombophilia 02/06/2024   Hardening of the aorta (main artery of the heart) 02/06/2024   Hypothyroidism 02/06/2024   Idiopathic sleep related nonobstructive alveolar hypoventilation 02/06/2024   Long term current use of anticoagulant therapy 02/06/2024   Obstructive sleep apnea syndrome 02/06/2024   S/P knee replacement 02/06/2024   Vitamin B12 deficiency (non anemic) 02/06/2024   Other fatigue increased 06/01/2020   Atrial fibrillation (HCC) 04/23/2020   Vitamin D  deficiency 04/08/2020   Hyperglycemia 03/12/2020   Essential hypertension 03/12/2020   Mixed hyperlipidemia 01/14/2020   Lower extremity edema 01/14/2020   Depression 01/14/2020   Class 1 obesity with serious comorbidity and body mass index (BMI) of 34.0 to 34.9 in adult 01/14/2020   Primary osteoarthritis of left knee 08/14/2018   Primary localized osteoarthritis of right knee 05/03/2016   Primary osteoarthritis of right knee 05/03/2016   Abnormal auditory perception of both ears 12/01/2015    PCP: Kip Righter  REFERRING PROVIDER: Sheril Coy  REFERRING DIAG: s/p Lt THA  THERAPY DIAG:  Status post left hip replacement  Muscle weakness (generalized)  Difficulty in walking, not elsewhere classified  Rationale for Evaluation and Treatment: Rehabilitation  ONSET DATE: 03/19/24  SUBJECTIVE:   Per eval: Pt is s/p Lt THA on 03/19/24. She ended up spending 3 days in the hospital due to blood pressure issues. She got home on 03/22/24. She ambulates into clinic with RW. She states she has had severe Lt hip pain. She states pain is all the time, pain increases with sit to stand and lifting Lt LE. Pain eases with meds.  SUBJECTIVE STATEMENT: 04/11/2024: pt states she did a lot of cleaning around the house yesterday and her hip is a bit irritated with it. States she also was pretty  busy around the house this morning. Has still been checking BP at home, sees her PCP tomorrow afternoon and cardiology in a couple weeks.    PERTINENT HISTORY: Bilat TKA, afib PAIN:  Are you having pain? 04/11/2024 3/10 in L hip at present, has been up to 8-9/10 in past week  Per eval:  Yes: NPRS scale: 2/10 currently, up to 9/10 at worst Pain location: Lt hip Pain description: severe Aggravating factors: transfers, lifting LT LE Relieving factors: meds  PRECAUTIONS: Fall (Per acute care MD notes in care everywhere, recommend continuing OP PT s/p hospitalization)  RED FLAGS: None   WEIGHT BEARING RESTRICTIONS: No  FALLS:  Has patient fallen in last 6 months? No  LIVING ENVIRONMENT: Lives with: lives alone Lives in: House/apartment Stairs: 1/2 step to enter Has following equipment at home: Environmental consultant - 2 wheeled  OCCUPATION: retired, enjoys walking  PLOF: Independent  PATIENT GOALS: walk without RW, decrease pain, drive  NEXT MD VISIT: TBD  OBJECTIVE:  Note: Objective measures were completed at Evaluation unless otherwise noted.  PATIENT SURVEYS:  LEFS 17/80    EDEMA:  Edema Lt LE as expected post surgery    POSTURE: weight shift right  PALPATION: Bandage intact over incision  LOWER EXTREMITY ROM:  Lt hip ROM with deficits as expected s/p Lt THA  LOWER EXTREMITY MMT:  MMT Right eval Left eval  Hip flexion  3-  Hip extension    Hip abduction    Hip adduction    Hip internal rotation    Hip external rotation    Knee flexion  3  Knee extension  3  Ankle dorsiflexion  3+  Ankle plantarflexion    Ankle inversion    Ankle eversion     (Blank rows = not tested)   FUNCTIONAL TESTS:  30 seconds chair stand test = 1 with UE support 3 minute walk test (04/09/24): 272' GAIT: Distance walked: 22' Assistive device utilized: Environmental consultant - 2 wheeled Level of assistance: Modified independence Comments: pt able to perform step through pattern, decreased  stance time on Lt  TREATMENT DATE:  [facility] Adult PT Treatment:                                                DATE: 04/11/24 Therapeutic Exercise: Hooklying marches (<90 deg) 2x8 BIL cues for pace/velocity Seated RB hamstring curl 3x8 Seated LAQ RB 2x8  Standing heel raises x12 w UE support   Neuromuscular re-ed: Seated TKE push into ball 2x10 LLE cues for comfortable quad contraction Standing marches x15 BIL cues for posture and control  Fwd/retro stepping RLE w/ LLE as stance - x8 cues for comfortable ROM and stance limb stability, CGA and UE support PRN  Self Care: Education/discussion re: gradual progression of household activities based on tolerance and activity modification PRN   [facility] Adult PT Treatment:                                                DATE: 04/09/24 Therapeutic Exercise/Activity: Standing Hip abd 2 x 10 Hip ext 2 x 10 HS curl 2 x 10 Hip flex 2 x 10 Step ups 6'' x 5 with 2 UE support Tandem stance 2 x 30 sec intermittent UE support 3 minute walk with SPC = 272' Seated LAQ red TB Hip abd red TB    PATIENT EDUCATION:  Education details: PT POC and goals, HEP Person educated: Patient Education method: Explanation, Demonstration, and Handouts Education comprehension: verbalized understanding and returned demonstration  HOME EXERCISE PROGRAM: Access Code: [email or web address] URL: [email or web address] Date: 04/09/2024 Prepared by: Darice Conine  Exercises - Standing Hip Abduction with Counter Support  - 1 x daily - 7 x weekly - 3 sets - 10 reps - Standing Hip Extension with Counter Support  - 1 x daily - 7 x weekly - 3 sets - 10 reps - Standing March with Counter Support  - 1 x daily - 7 x weekly - 3 sets - 10 reps - Supine Hip Abduction  - 1 x daily - 7 x weekly - 3 sets - 10 reps - Supine Heel Slide with Strap  - 1 x  daily - 7 x weekly - 3 sets - 10 reps - Seated Long Arc Quad  - 1 x daily - 7 x weekly - 3 sets - 10 reps - Standing Tandem Balance with Counter Support  - 1 x daily - 7 x weekly - 1 sets - 3 reps - 30 seconds hold  ASSESSMENT:  CLINICAL IMPRESSION: 04/11/2024: Pt arrives w/ report of hip soreness after increased household tasks but overall continuing to do well, checking BP at home and seeing PCP tomorrow. We begin session with focal strengthening which she tolerates well with muscular fatigue as expected. Closed chain tasks limited more so by postural stability rather than pain. No adverse events, tolerates session well with primary report of muscular fatigue. Recommend continuing along current POC in order to address relevant deficits and improve functional tolerance. Pt departs today's session in no acute distress, all voiced questions/concerns addressed appropriately from PT perspective.    Per eval: Patient is a 73 y.o. female who was seen today for physical therapy evaluation and treatment for Lt THA on 03/19/24. She had complications post op and was d/c'd from the hospital 03/22/24. She presents with  impaired gait and functional mobility, impaired strength and balance and will benefit from skilled PT to address deficits and improve functional mobility and independence.   OBJECTIVE IMPAIRMENTS: Abnormal gait, decreased activity tolerance, decreased balance, decreased mobility, difficulty walking, decreased ROM, decreased strength, increased edema, increased muscle spasms, and pain.     GOALS: Goals reviewed with patient? Yes  SHORT TERM GOALS: Target date: 04/24/2024   Pt will be independent with initial HEP Baseline: Goal status: INITIAL  2.  Pt will improve Lt LE strength to 4/5 to improve standing and walking tolerance Baseline:  Goal status: INITIAL    LONG TERM GOALS: Target date: 05/22/2024   Pt will be independent with advanced HEP Baseline:  Goal status: INITIAL  2.  Pt  will improve LEFS to >= 27/80 to demo improved functional mobility Baseline:  Goal status: INITIAL  3.  Pt will perform car transfers without use of leg lifter strap Baseline:  Goal status: INITIAL  4.  Pt will walk x 150' without AD with pain <= 2/10 Baseline:  Goal status: INITIAL     PLAN:  PT FREQUENCY: 2x/week  PT DURATION: 8 weeks  PLANNED INTERVENTIONS: 97164- PT Re-evaluation, 97110-Therapeutic exercises, 97530- Therapeutic activity, 97112- Neuromuscular re-education, 97535- Self Care, 02859- Manual therapy, [phone]- Gait training, [phone]- Aquatic Therapy, [phone]- Electrical stimulation (unattended), [phone] (1-2 muscles), 20561 (3+ muscles)- Dry Needling, Patient/Family education, Balance training, Stair training, Taping, Cryotherapy, and Moist heat  PLAN FOR NEXT SESSION: Functional mobility, gait/balance as able/appropriate    Alm DELENA Jenny PT, DPT 04/11/2024 11:46 AM

## 2024-04-12 DIAGNOSIS — I4891 Unspecified atrial fibrillation: Secondary | ICD-10-CM | POA: Diagnosis not present

## 2024-04-12 DIAGNOSIS — Z09 Encounter for follow-up examination after completed treatment for conditions other than malignant neoplasm: Secondary | ICD-10-CM | POA: Diagnosis not present

## 2024-04-12 DIAGNOSIS — E039 Hypothyroidism, unspecified: Secondary | ICD-10-CM | POA: Diagnosis not present

## 2024-04-12 DIAGNOSIS — Z96649 Presence of unspecified artificial hip joint: Secondary | ICD-10-CM | POA: Diagnosis not present

## 2024-04-12 DIAGNOSIS — R55 Syncope and collapse: Secondary | ICD-10-CM | POA: Diagnosis not present

## 2024-04-12 DIAGNOSIS — R600 Localized edema: Secondary | ICD-10-CM | POA: Diagnosis not present

## 2024-04-15 NOTE — Therapy (Unsigned)
 OUTPATIENT PHYSICAL THERAPY TREATMENT    Patient Name: Robin Christensen MRN: [id number] DOB:Apr 11, 1951, 73 y.o., female Today's Date: 04/16/2024  END OF SESSION:  PT End of Session - 04/16/24 1138     Visit Number 4    Number of Visits 16    Date for Recertification  05/22/24    Authorization Type [employer]vantage    Progress Note Due on Visit 10    PT Start Time 0930    PT Stop Time 1015    PT Time Calculation (min) 45 min    Activity Tolerance Patient tolerated treatment well    Behavior During Therapy WFL for tasks assessed/performed              Past Medical History:  Diagnosis Date   A-fib (HCC)    Arthritis    B12 deficiency    Back pain    Breast cancer (HCC)    Cancer (HCC)    Right Breast Cancer   Chronic headaches    Dysrhythmia    AF   Edema, lower extremity    Gallbladder disease    GERD (gastroesophageal reflux disease)    Hyperlipidemia    Hypertension    Hypothyroidism    Joint pain    Obesity    Pneumonia    PONV (postoperative nausea and vomiting)    Sleep apnea    cpcap   Vitamin deficiency    Past Surgical History:  Procedure Laterality Date   BREAST SURGERY     CARDIOVERSION N/A 06/26/2014   Procedure: CARDIOVERSION;  Surgeon: Erick JONELLE Bergamo, MD;  Location: [facility] ENDOSCOPY;  Service: Cardiovascular;  Laterality: N/A;   CARDIOVERSION  09/07/2023   done at [facility]   CATARACT EXTRACTION Bilateral    CHOLECYSTECTOMY     DILATION AND CURETTAGE OF UTERUS     eyelid surgery     TOTAL HIP ARTHROPLASTY Left 03/19/2024   Procedure: ARTHROPLASTY, HIP, TOTAL, ANTERIOR APPROACH;  Surgeon: Sheril Coy, MD;  Location: [facility] ORS;  Service: Orthopedics;  Laterality: Left;   TOTAL KNEE ARTHROPLASTY Right 05/03/2016   Procedure: TOTAL KNEE ARTHROPLASTY;  Surgeon: Coy Sheril, MD;  Location: [facility] OR;  Service: Orthopedics;  Laterality: Right;   TOTAL KNEE ARTHROPLASTY Left 08/14/2018   Procedure: TOTAL KNEE ARTHROPLASTY;   Surgeon: Sheril Coy, MD;  Location: [facility] OR;  Service: Orthopedics;  Laterality: Left;   Patient Active Problem List   Diagnosis Date Noted   Primary localized osteoarthritis of left hip 03/19/2024   S/P total left hip arthroplasty 03/19/2024   Acquired thrombophilia 02/06/2024   Hardening of the aorta (main artery of the heart) 02/06/2024   Hypothyroidism 02/06/2024   Idiopathic sleep related nonobstructive alveolar hypoventilation 02/06/2024   Long term current use of anticoagulant therapy 02/06/2024   Obstructive sleep apnea syndrome 02/06/2024   S/P knee replacement 02/06/2024   Vitamin B12 deficiency (non anemic) 02/06/2024   Other fatigue increased 06/01/2020   Atrial fibrillation (HCC) 04/23/2020   Vitamin D  deficiency 04/08/2020   Hyperglycemia 03/12/2020   Essential hypertension 03/12/2020   Mixed hyperlipidemia 01/14/2020   Lower extremity edema 01/14/2020   Depression 01/14/2020   Class 1 obesity with serious comorbidity and body mass index (BMI) of 34.0 to 34.9 in adult 01/14/2020   Primary osteoarthritis of left knee 08/14/2018   Primary localized osteoarthritis of right knee 05/03/2016   Primary osteoarthritis of right knee 05/03/2016   Abnormal auditory perception of both ears 12/01/2015    PCP: Kip Righter  REFERRING PROVIDER: Sheril Coy  REFERRING DIAG: s/p Lt THA  THERAPY DIAG:  Muscle weakness (generalized)  Difficulty in walking, not elsewhere classified  Rationale for Evaluation and Treatment: Rehabilitation  ONSET DATE: 03/19/24  SUBJECTIVE:   Per eval: Pt is s/p Lt THA on 03/19/24. She ended up spending 3 days in the hospital due to blood pressure issues. She got home on 03/22/24. She ambulates into clinic with RW. She states she has had severe Lt hip pain. She states pain is all the time, pain increases with sit to stand and lifting Lt LE. Pain eases with meds.  SUBJECTIVE STATEMENT: Pt reports she was fine yesterday, however last night  was cooking dinner and doing her second round of exercises, all the sudden started having muscle spasms and sharp pains in left leg, she states taking a muscle relaxer that helped. Pt states she started driving this past Friday and drove here today for session.  She has sharp 3/10 pain today esp left knee.    PERTINENT HISTORY: Bilat TKA, afib PAIN:  Are you having pain?  3/10 in L hip at present, has been up to 8-9/10 in past week  Per eval:  Yes: NPRS scale: 3/10 currently, up to 9/10 at worst Pain location: Lt hip Pain description: severe Aggravating factors: transfers, lifting LT LE Relieving factors: meds  PRECAUTIONS: Fall (Per acute care MD notes in care everywhere, recommend continuing OP PT s/p hospitalization)  RED FLAGS: None   WEIGHT BEARING RESTRICTIONS: No  FALLS:  Has patient fallen in last 6 months? No  LIVING ENVIRONMENT: Lives with: lives alone Lives in: House/apartment Stairs: 1/2 step to enter Has following equipment at home: Environmental consultant - 2 wheeled  OCCUPATION: retired, enjoys walking  PLOF: Independent  PATIENT GOALS: walk without RW, decrease pain, drive  NEXT MD VISIT: TBD  OBJECTIVE:  Note: Objective measures were completed at Evaluation unless otherwise noted.  PATIENT SURVEYS:  LEFS 17/80    EDEMA:  Edema Lt LE as expected post surgery    POSTURE: weight shift right  PALPATION: Bandage intact over incision  LOWER EXTREMITY ROM:  Lt hip ROM with deficits as expected s/p Lt THA  LOWER EXTREMITY MMT:  MMT Right eval Left eval  Hip flexion  3-  Hip extension    Hip abduction    Hip adduction    Hip internal rotation    Hip external rotation    Knee flexion  3  Knee extension  3  Ankle dorsiflexion  3+  Ankle plantarflexion    Ankle inversion    Ankle eversion     (Blank rows = not tested)   FUNCTIONAL TESTS:  30 seconds chair stand test = 1 with UE support 3 minute walk test (04/09/24): 272' GAIT: Distance walked:  46' Assistive device utilized: Environmental consultant - 2 wheeled Level of assistance: Modified independence Comments: pt able to perform step through pattern, decreased stance time on Lt  TREATMENT DATE:  [facility] Adult PT Treatment:                                                DATE: 04/16/24 Therapeutic Exercise: Hooklying marches 2x5- difficult Seated LAQ 2x10   Manual Therapy: STM to left IT band using roller- pt tender to light pressure, fluid build up in lateral thigh Neuromuscular re-ed: Standing TKE using towel against wall 2x10 left knee Posterior pelvic tilts in supine x10   Therapeutic Activity: Mini squats at stairs 2x 10   Self Care: HEP review  Taught pt how to self massage lateral leg alongside TFL/ IT band Discussed overdoing it during an exercise, how to practice mindfulness. Discussed how to create mind to muscle connections and being cognizant of how body feels     [facility] Adult PT Treatment:                                                DATE: 04/11/24 Therapeutic Exercise: Hooklying marches (<90 deg) 2x8 BIL cues for pace/velocity Seated RB hamstring curl 3x8 Seated LAQ RB 2x8  Standing heel raises x12 w UE support   Neuromuscular re-ed: Seated TKE push into ball 2x10 LLE cues for comfortable quad contraction Standing marches x15 BIL cues for posture and control  Fwd/retro stepping RLE w/ LLE as stance - x8 cues for comfortable ROM and stance limb stability, CGA and UE support PRN  Self Care: Education/discussion re: gradual progression of household activities based on tolerance and activity modification PRN   [facility] Adult PT Treatment:                                                DATE: 04/09/24 Therapeutic Exercise/Activity: Standing Hip abd 2 x 10 Hip ext 2 x 10 HS curl 2 x 10 Hip flex 2 x 10 Step ups 6'' x 5 with 2 UE support Tandem stance  2 x 30 sec intermittent UE support 3 minute walk with SPC = 272' Seated LAQ red TB Hip abd red TB    PATIENT EDUCATION:  Education details: See treatment  Person educated: Patient Education method: Explanation, Demonstration, and Handouts Education comprehension: verbalized understanding and returned demonstration  HOME EXERCISE PROGRAM: Access Code: [email or web address] URL: [email or web address] Date: 04/09/2024 Prepared by: Darice Conine  Exercises - Standing Hip Abduction with Counter Support  - 1 x daily - 7 x weekly - 3 sets - 10 reps - Standing Hip Extension with Counter Support  - 1 x daily - 7 x weekly - 3 sets - 10 reps - Standing March with Counter Support  - 1 x daily - 7 x weekly - 3 sets - 10 reps - Supine Hip Abduction  - 1 x daily - 7 x weekly - 3 sets - 10 reps - Supine Heel Slide with Strap  - 1 x daily - 7 x weekly - 3 sets - 10 reps - Seated Long Arc Quad  - 1 x daily - 7 x weekly - 3 sets - 10 reps - Standing Tandem Balance with Counter Support  - 1 x  daily - 7 x weekly - 1 sets - 3 reps - 30 seconds hold  ASSESSMENT:  CLINICAL IMPRESSION: Ms Goda presents to skilled therapy with complaints of left knee pain she experienced after performing HEP last night, stating she thinks it could have been that she overdid the marches. Pt verbally stated having left leg pain during concentric motion of LAQ; examined her TFL/IT band noting tightness and tenderness in the area thus performed STM to reduce tissue tension. Pt verbally stated it reduced the pain.  Pt taught how to self STM the area using a roller. Pt had difficulty performing marches in supine, esp in left leg most likely due to hip flexor muscle weakness. Pt was modified during standing hip abduction movement to maintain neutral hips, requiring only one verbal cue and demonstration before performing motion correctly. Pt will benefit from continuing skilled therapy to address the deficits below.   Per  eval: Patient is a 73 y.o. female who was seen today for physical therapy evaluation and treatment for Lt THA on 03/19/24. She had complications post op and was d/c'd from the hospital 03/22/24. She presents with impaired gait and functional mobility, impaired strength and balance and will benefit from skilled PT to address deficits and improve functional mobility and independence.   OBJECTIVE IMPAIRMENTS: Abnormal gait, decreased activity tolerance, decreased balance, decreased mobility, difficulty walking, decreased ROM, decreased strength, increased edema, increased muscle spasms, and pain.     GOALS: Goals reviewed with patient? Yes  SHORT TERM GOALS: Target date: 04/24/2024   Pt will be independent with initial HEP Baseline: Goal status: MET on 04/16/24  2.  Pt will improve Lt LE strength to 4/5 to improve standing and walking tolerance Baseline:  Goal status: Progressing     LONG TERM GOALS: Target date: 05/22/2024   Pt will be independent with advanced HEP Baseline:  Goal status: INITIAL  2.  Pt will improve LEFS to >= 27/80 to demo improved functional mobility Baseline:  Goal status: INITIAL  3.  Pt will perform car transfers without use of leg lifter strap Baseline:  Goal status: INITIAL  4.  Pt will walk x 150' without AD with pain <= 2/10 Baseline:  Goal status: INITIAL     PLAN:  PT FREQUENCY: 2x/week  PT DURATION: 8 weeks  PLANNED INTERVENTIONS: 97164- PT Re-evaluation, 97110-Therapeutic exercises, 97530- Therapeutic activity, 97112- Neuromuscular re-education, 97535- Self Care, 02859- Manual therapy, [id number]- Gait training, [phone]- Aquatic Therapy, [phone]- Electrical stimulation (unattended), 20560 (1-2 muscles), 20561 (3+ muscles)- Dry Needling, Patient/Family education, Balance training, Stair training, Taping, Cryotherapy, and Moist heat  PLAN FOR NEXT SESSION: Functional mobility, gait/balance as able/appropriate, IT band STM and or stretching, Terminal knee  extensions

## 2024-04-16 ENCOUNTER — Encounter: Payer: Self-pay | Admitting: Physical Therapy

## 2024-04-16 ENCOUNTER — Ambulatory Visit: Payer: Medicare (Managed Care) | Admitting: Physical Therapy

## 2024-04-16 DIAGNOSIS — R262 Difficulty in walking, not elsewhere classified: Secondary | ICD-10-CM

## 2024-04-16 DIAGNOSIS — I10 Essential (primary) hypertension: Secondary | ICD-10-CM | POA: Diagnosis not present

## 2024-04-16 DIAGNOSIS — I4891 Unspecified atrial fibrillation: Secondary | ICD-10-CM | POA: Diagnosis not present

## 2024-04-16 DIAGNOSIS — M6281 Muscle weakness (generalized): Secondary | ICD-10-CM

## 2024-04-16 DIAGNOSIS — Z96642 Presence of left artificial hip joint: Secondary | ICD-10-CM | POA: Diagnosis not present

## 2024-04-17 DIAGNOSIS — E785 Hyperlipidemia, unspecified: Secondary | ICD-10-CM | POA: Diagnosis not present

## 2024-04-17 DIAGNOSIS — E66812 Obesity, class 2: Secondary | ICD-10-CM | POA: Diagnosis not present

## 2024-04-17 DIAGNOSIS — Z6837 Body mass index (BMI) 37.0-37.9, adult: Secondary | ICD-10-CM | POA: Diagnosis not present

## 2024-04-17 DIAGNOSIS — R632 Polyphagia: Secondary | ICD-10-CM | POA: Diagnosis not present

## 2024-04-17 DIAGNOSIS — R7303 Prediabetes: Secondary | ICD-10-CM | POA: Diagnosis not present

## 2024-04-18 ENCOUNTER — Ambulatory Visit: Payer: Medicare (Managed Care) | Attending: Orthopaedic Surgery

## 2024-04-18 DIAGNOSIS — Z96642 Presence of left artificial hip joint: Secondary | ICD-10-CM | POA: Diagnosis not present

## 2024-04-18 DIAGNOSIS — R262 Difficulty in walking, not elsewhere classified: Secondary | ICD-10-CM | POA: Diagnosis not present

## 2024-04-18 DIAGNOSIS — M6281 Muscle weakness (generalized): Secondary | ICD-10-CM | POA: Insufficient documentation

## 2024-04-18 NOTE — Therapy (Addendum)
 OUTPATIENT PHYSICAL THERAPY TREATMENT    Patient Name: Robin Christensen MRN: [id number] DOB:[birth year], 73 y.o., female Today's Date: 04/18/2024  END OF SESSION:  PT End of Session - 04/18/24 1033     Visit Number 5    Number of Visits 16    Date for Recertification  05/22/24    Authorization Type [employer] Advantage    Progress Note Due on Visit 10    PT Start Time 0933    PT Stop Time 1013    PT Time Calculation (min) 40 min    Activity Tolerance Patient tolerated treatment well    Behavior During Therapy WFL for tasks assessed/performed               Past Medical History:  Diagnosis Date   A-fib (HCC)    Arthritis    B12 deficiency    Back pain    Breast cancer (HCC)    Cancer (HCC)    Right Breast Cancer   Chronic headaches    Dysrhythmia    AF   Edema, lower extremity    Gallbladder disease    GERD (gastroesophageal reflux disease)    Hyperlipidemia    Hypertension    Hypothyroidism    Joint pain    Obesity    Pneumonia    PONV (postoperative nausea and vomiting)    Sleep apnea    cpcap   Vitamin deficiency    Past Surgical History:  Procedure Laterality Date   BREAST SURGERY     CARDIOVERSION N/A 06/26/2014   Procedure: CARDIOVERSION;  Surgeon: Erick JONELLE Bergamo, MD;  Location: [facility] ENDOSCOPY;  Service: Cardiovascular;  Laterality: N/A;   CARDIOVERSION  09/07/2023   done at [facility]   CATARACT EXTRACTION Bilateral    CHOLECYSTECTOMY     DILATION AND CURETTAGE OF UTERUS     eyelid surgery     TOTAL HIP ARTHROPLASTY Left 03/19/2024   Procedure: ARTHROPLASTY, HIP, TOTAL, ANTERIOR APPROACH;  Surgeon: Sheril Coy, MD;  Location: [facility] ORS;  Service: Orthopedics;  Laterality: Left;   TOTAL KNEE ARTHROPLASTY Right 05/03/2016   Procedure: TOTAL KNEE ARTHROPLASTY;  Surgeon: Coy Sheril, MD;  Location: [facility] OR;  Service: Orthopedics;  Laterality: Right;   TOTAL KNEE ARTHROPLASTY Left 08/14/2018   Procedure: TOTAL KNEE ARTHROPLASTY;   Surgeon: Sheril Coy, MD;  Location: [facility] OR;  Service: Orthopedics;  Laterality: Left;   Patient Active Problem List   Diagnosis Date Noted   Primary localized osteoarthritis of left hip 03/19/2024   S/P total left hip arthroplasty 03/19/2024   Acquired thrombophilia 02/06/2024   Hardening of the aorta (main artery of the heart) 02/06/2024   Hypothyroidism 02/06/2024   Idiopathic sleep related nonobstructive alveolar hypoventilation 02/06/2024   Long term current use of anticoagulant therapy 02/06/2024   Obstructive sleep apnea syndrome 02/06/2024   S/P knee replacement 02/06/2024   Vitamin B12 deficiency (non anemic) 02/06/2024   Other fatigue increased 06/01/2020   Atrial fibrillation (HCC) 04/23/2020   Vitamin D  deficiency 04/08/2020   Hyperglycemia 03/12/2020   Essential hypertension 03/12/2020   Mixed hyperlipidemia 01/14/2020   Lower extremity edema 01/14/2020   Depression 01/14/2020   Class 1 obesity with serious comorbidity and body mass index (BMI) of 34.0 to 34.9 in adult 01/14/2020   Primary osteoarthritis of left knee 08/14/2018   Primary localized osteoarthritis of right knee 05/03/2016   Primary osteoarthritis of right knee 05/03/2016   Abnormal auditory perception of both ears 12/01/2015    PCP: Kip Righter  REFERRING PROVIDER: Sheril Coy  REFERRING DIAG: s/p Lt THA  THERAPY DIAG:  Muscle weakness (generalized)  Difficulty in walking, not elsewhere classified  Status post left hip replacement  Rationale for Evaluation and Treatment: Rehabilitation  ONSET DATE: 03/19/24  SUBJECTIVE:   Per eval: Pt is s/p Lt THA on 03/19/24. She ended up spending 3 days in the hospital due to blood pressure issues. She got home on 03/22/24. She ambulates into clinic with RW. She states she has had severe Lt hip pain. She states pain is all the time, pain increases with sit to stand and lifting Lt LE. Pain eases with meds.  SUBJECTIVE STATEMENT:   Pt states  she's in a better frame of mind since her last session, she had to put her cat of 15 years down after her last session. She states feeling ' a hair better today'.   PERTINENT HISTORY: Bilat TKA, afib PAIN:  Are you having pain?  Currently 3/10 in L hip at present, has been up to 8-9/10 in past week  Per eval:  Yes: NPRS scale: 3/10 currently, up to 9/10 at worst Pain location: Lt hip Pain description: severe Aggravating factors: transfers, lifting LT LE Relieving factors: meds  PRECAUTIONS: Fall (Per acute care MD notes in care everywhere, recommend continuing OP PT s/p hospitalization)  RED FLAGS: None   WEIGHT BEARING RESTRICTIONS: No  FALLS:  Has patient fallen in last 6 months? No  LIVING ENVIRONMENT: Lives with: lives alone Lives in: House/apartment Stairs: 1/2 step to enter Has following equipment at home: Environmental consultant - 2 wheeled  OCCUPATION: retired, enjoys walking  PLOF: Independent  PATIENT GOALS: walk without RW, decrease pain, drive  NEXT MD VISIT: TBD  OBJECTIVE:  Note: Objective measures were completed at Evaluation unless otherwise noted.  PATIENT SURVEYS:  LEFS 17/80    EDEMA:  Edema Lt LE as expected post surgery    POSTURE: weight shift right  PALPATION: Bandage intact over incision  LOWER EXTREMITY ROM:  Lt hip ROM with deficits as expected s/p Lt THA  LOWER EXTREMITY MMT:  MMT Right eval Left eval  Hip flexion  3-  Hip extension    Hip abduction    Hip adduction    Hip internal rotation    Hip external rotation    Knee flexion  3  Knee extension  3  Ankle dorsiflexion  3+  Ankle plantarflexion    Ankle inversion    Ankle eversion     (Blank rows = not tested)   FUNCTIONAL TESTS:  30 seconds chair stand test = 1 with UE support 3 minute walk test (04/09/24): 272' GAIT: Distance walked: 36' Assistive device utilized: Environmental consultant - 2 wheeled Level of assistance: Modified independence Comments: pt able to perform step through  pattern, decreased stance time on Lt  [facility] Adult PT Treatment:                                                DATE: 04/18/24 Therapeutic Exercise: AROM shoulder retraction x10 each side  Seated heel raises 2x10  Manual Therapy: STM using roller to decrease excess fluid and tissue restriction in left thigh Neuromuscular re-ed: Ball squeeze on lap for TA activation 2x10 - verbal cues for breathing properly; for postural strengthening LAQ 2x10 each side for quad activation  Standing hip abduction 2x10 Standing hamstring curl to  neutral 2x5 each side Therapeutic Activity: Standing marching x20 Sit to stands x15; 5 of those were from table set 23 inches high off floor Self Care: Discussed death of her cat, and how to perform exercises that relieve stress from the body                                                                                                                                 TREATMENT DATE:  [facility] Adult PT Treatment:                                                DATE: 04/16/24 Therapeutic Exercise: Hooklying marches 2x5- difficult Seated LAQ 2x10   Manual Therapy: STM to left IT band using roller- pt tender to light pressure, fluid build up in lateral thigh Neuromuscular re-ed: Standing TKE using towel against wall 2x10 left knee Posterior pelvic tilts in supine x10   Therapeutic Activity: Mini squats at stairs 2x 10   Self Care: HEP review  Taught pt how to self massage lateral leg alongside TFL/ IT band Discussed overdoing it during an exercise, how to practice mindfulness. Discussed how to create mind to muscle connections and being cognizant of how body feels     [facility] Adult PT Treatment:                                                DATE: 04/11/24 Therapeutic Exercise: Hooklying marches (<90 deg) 2x8 BIL cues for pace/velocity Seated RB hamstring curl 3x8 Seated LAQ RB 2x8  Standing heel raises x12 w UE support   Neuromuscular re-ed: Seated TKE push  into ball 2x10 LLE cues for comfortable quad contraction Standing marches x15 BIL cues for posture and control  Fwd/retro stepping RLE w/ LLE as stance - x8 cues for comfortable ROM and stance limb stability, CGA and UE support PRN  Self Care: Education/discussion re: gradual progression of household activities based on tolerance and activity modification PRN   [facility] Adult PT Treatment:                                                DATE: 04/09/24 Therapeutic Exercise/Activity: Standing Hip abd 2 x 10 Hip ext 2 x 10 HS curl 2 x 10 Hip flex 2 x 10 Step ups 6'' x 5 with 2 UE support Tandem stance 2 x 30 sec intermittent UE support 3 minute walk with SPC = 272' Seated LAQ red TB Hip abd red TB    PATIENT EDUCATION:  Education details: See  treatment  Person educated: Patient Education method: Explanation and Demonstration Education comprehension: verbalized understanding and returned demonstration  HOME EXERCISE PROGRAM: Access Code: [email or web address] URL: [email or web address] Date: 04/09/2024 Prepared by: Darice Conine  Exercises - Standing Hip Abduction with Counter Support  - 1 x daily - 7 x weekly - 3 sets - 10 reps - Standing Hip Extension with Counter Support  - 1 x daily - 7 x weekly - 3 sets - 10 reps - Standing March with Counter Support  - 1 x daily - 7 x weekly - 3 sets - 10 reps - Supine Hip Abduction  - 1 x daily - 7 x weekly - 3 sets - 10 reps - Supine Heel Slide with Strap  - 1 x daily - 7 x weekly - 3 sets - 10 reps - Seated Long Arc Quad  - 1 x daily - 7 x weekly - 3 sets - 10 reps - Standing Tandem Balance with Counter Support  - 1 x daily - 7 x weekly - 1 sets - 3 reps - 30 seconds hold  ASSESSMENT:  CLINICAL IMPRESSION:  Ms Fukuda is progressing well since her left hip surgery. She admits due to personal grievances that recently occurred, she has not been as active with performing her HEP as she had hoped, however is determined to return to  exercises this week. Pt demonstrated improvement with sit to stands, trialing no use of UE support from table at 23 inches high off floor. Pt still requires verbal cues to engage core during exercises, esp standing activities. Pt verbally stated the soft tissue mobilization using the roller especially helped to alleviate left thigh/hip pain. Pt still has difficulty with coordination during sit to stands, requiring several verbal cues consistently with correct muscle activation. Pt will benefit from continuing skilled therapy to address the deficits below.     Per eval: Patient is a 73 y.o. female who was seen today for physical therapy evaluation and treatment for Lt THA on 03/19/24. She had complications post op and was d/c'd from the hospital 03/22/24. She presents with impaired gait and functional mobility, impaired strength and balance and will benefit from skilled PT to address deficits and improve functional mobility and independence.   OBJECTIVE IMPAIRMENTS: Abnormal gait, decreased activity tolerance, decreased balance, decreased mobility, difficulty walking, decreased ROM, decreased strength, increased edema, increased muscle spasms, and pain.     GOALS: Goals reviewed with patient? Yes  SHORT TERM GOALS: Target date: 04/24/2024   Pt will be independent with initial HEP Baseline: Goal status: MET on 04/16/24  2.  Pt will improve Lt LE strength to 4/5 to improve standing and walking tolerance Baseline:  Goal status: MET on 04/18/24    LONG TERM GOALS: Target date: 05/22/2024   Pt will be independent with advanced HEP Baseline:  Goal status: INITIAL  2.  Pt will improve LEFS to >= 27/80 to demo improved functional mobility Baseline:  Goal status: INITIAL  3.  Pt will perform car transfers without use of leg lifter strap Baseline:  Goal status: INITIAL  4.  Pt will walk x 150' without AD with pain <= 2/10 Baseline:  Goal status: INITIAL     PLAN:  PT FREQUENCY:  2x/week  PT DURATION: 8 weeks  PLANNED INTERVENTIONS: 97164- PT Re-evaluation, 97110-Therapeutic exercises, 97530- Therapeutic activity, 97112- Neuromuscular re-education, 97535- Self Care, 02859- Manual therapy, [phone]- Gait training, [phone]- Aquatic Therapy, [phone]- Electrical stimulation (unattended), [phone] (1-2 muscles), 20561 (3+ muscles)- Dry Needling, Patient/Family education,  Balance training, Stair training, Taping, Cryotherapy, and Moist heat  PLAN FOR NEXT SESSION: Functional mobility, gait/balance as able/appropriate, IT band STM and or stretching, Terminal knee extensions, sit to stands from higher surface over 23 inches high     Lavanda Cleverly, SPT 04/18/24 12:48 PM Lavanda Cleverly, SPT 04/18/24 12:50 PM

## 2024-04-22 NOTE — Therapy (Deleted)
 OUTPATIENT PHYSICAL THERAPY TREATMENT    Patient Name: Robin Christensen MRN: [id number] DOB:Jun 17, 1951, 73 y.o., female Today's Date: 04/22/2024  END OF SESSION:         Past Medical History:  Diagnosis Date   A-fib [facility])    Arthritis    B12 deficiency    Back pain    Breast cancer (HCC)    Cancer (HCC)    Right Breast Cancer   Chronic headaches    Dysrhythmia    AF   Edema, lower extremity    Gallbladder disease    GERD (gastroesophageal reflux disease)    Hyperlipidemia    Hypertension    Hypothyroidism    Joint pain    Obesity    Pneumonia    PONV (postoperative nausea and vomiting)    Sleep apnea    cpcap   Vitamin deficiency    Past Surgical History:  Procedure Laterality Date   BREAST SURGERY     CARDIOVERSION N/A 06/26/2014   Procedure: CARDIOVERSION;  Surgeon: Erick JONELLE Bergamo, MD;  Location: [facility] ENDOSCOPY;  Service: Cardiovascular;  Laterality: N/A;   CARDIOVERSION  09/07/2023   done at [facility]   CATARACT EXTRACTION Bilateral    CHOLECYSTECTOMY     DILATION AND CURETTAGE OF UTERUS     eyelid surgery     TOTAL HIP ARTHROPLASTY Left 03/19/2024   Procedure: ARTHROPLASTY, HIP, TOTAL, ANTERIOR APPROACH;  Surgeon: Sheril Coy, MD;  Location: [facility] ORS;  Service: Orthopedics;  Laterality: Left;   TOTAL KNEE ARTHROPLASTY Right 05/03/2016   Procedure: TOTAL KNEE ARTHROPLASTY;  Surgeon: Coy Sheril, MD;  Location: [facility] OR;  Service: Orthopedics;  Laterality: Right;   TOTAL KNEE ARTHROPLASTY Left 08/14/2018   Procedure: TOTAL KNEE ARTHROPLASTY;  Surgeon: Sheril Coy, MD;  Location: [facility] OR;  Service: Orthopedics;  Laterality: Left;   Patient Active Problem List   Diagnosis Date Noted   Primary localized osteoarthritis of left hip 03/19/2024   S/P total left hip arthroplasty 03/19/2024   Acquired thrombophilia 02/06/2024   Hardening of the aorta (main artery of the heart) 02/06/2024   Hypothyroidism 02/06/2024   Idiopathic sleep related  nonobstructive alveolar hypoventilation 02/06/2024   Long term current use of anticoagulant therapy 02/06/2024   Obstructive sleep apnea syndrome 02/06/2024   S/P knee replacement 02/06/2024   Vitamin B12 deficiency (non anemic) 02/06/2024   Other fatigue increased 06/01/2020   Atrial fibrillation (HCC) 04/23/2020   Vitamin D  deficiency 04/08/2020   Hyperglycemia 03/12/2020   Essential hypertension 03/12/2020   Mixed hyperlipidemia 01/14/2020   Lower extremity edema 01/14/2020   Depression 01/14/2020   Class 1 obesity with serious comorbidity and body mass index (BMI) of 34.0 to 34.9 in adult 01/14/2020   Primary osteoarthritis of left knee 08/14/2018   Primary localized osteoarthritis of right knee 05/03/2016   Primary osteoarthritis of right knee 05/03/2016   Abnormal auditory perception of both ears 12/01/2015    PCP: Kip Righter  REFERRING PROVIDER: Sheril Coy  REFERRING DIAG: s/p Lt THA  THERAPY DIAG:  No diagnosis found.  Rationale for Evaluation and Treatment: Rehabilitation  ONSET DATE: 03/19/24  SUBJECTIVE:   Per eval: Pt is s/p Lt THA on 03/19/24. She ended up spending 3 days in the hospital due to blood pressure issues. She got home on 03/22/24. She ambulates into clinic with RW. She states she has had severe Lt hip pain. She states pain is all the time, pain increases with sit to stand and lifting Lt LE. Pain eases  with meds.  SUBJECTIVE STATEMENT:  Pt reports   PERTINENT HISTORY: Bilat TKA, afib PAIN:  Are you having pain?  Currently 3/10 in L hip at present, has been up to 8-9/10 in past week  Per eval:  Yes: NPRS scale: 3/10 currently, up to 9/10 at worst Pain location: Lt hip Pain description: severe Aggravating factors: transfers, lifting LT LE Relieving factors: meds  PRECAUTIONS: Fall (Per acute care MD notes in care everywhere, recommend continuing OP PT s/p hospitalization)  RED FLAGS: None   WEIGHT BEARING RESTRICTIONS: No  FALLS:   Has patient fallen in last 6 months? No  LIVING ENVIRONMENT: Lives with: lives alone Lives in: House/apartment Stairs: 1/2 step to enter Has following equipment at home: Environmental consultant - 2 wheeled  OCCUPATION: retired, enjoys walking  PLOF: Independent  PATIENT GOALS: walk without RW, decrease pain, drive  NEXT MD VISIT: TBD  OBJECTIVE:  Note: Objective measures were completed at Evaluation unless otherwise noted.  PATIENT SURVEYS:  LEFS 17/80    EDEMA:  Edema Lt LE as expected post surgery    POSTURE: weight shift right  PALPATION: Bandage intact over incision  LOWER EXTREMITY ROM:  Lt hip ROM with deficits as expected s/p Lt THA  LOWER EXTREMITY MMT:  MMT Right eval Left eval  Hip flexion  3-  Hip extension    Hip abduction    Hip adduction    Hip internal rotation    Hip external rotation    Knee flexion  3  Knee extension  3  Ankle dorsiflexion  3+  Ankle plantarflexion    Ankle inversion    Ankle eversion     (Blank rows = not tested)   FUNCTIONAL TESTS:  30 seconds chair stand test = 1 with UE support 3 minute walk test (04/09/24): 272' GAIT: Distance walked: 68' Assistive device utilized: Environmental consultant - 2 wheeled Level of assistance: Modified independence Comments: pt able to perform step through pattern, decreased stance time on Lt                                                                                                                                  TREATMENT DATE:   [facility] Adult PT Treatment:                                                DATE: 04/23/24 Therapeutic Exercise: *** Manual Therapy: *** Neuromuscular re-ed: *** Therapeutic Activity: *** Modalities: *** Self Care: [facility] [employer] Adult PT Treatment:                                                DATE: 04/18/24 Therapeutic Exercise: AROM  shoulder retraction x10 each side  Seated heel raises 2x10  Manual Therapy: STM using roller to decrease excess fluid and tissue  restriction in left thigh Neuromuscular re-ed: Ball squeeze on lap for TA activation 2x10 - verbal cues for breathing properly; for postural strengthening LAQ 2x10 each side for quad activation  Standing hip abduction 2x10 Standing hamstring curl to neutral 2x5 each side Therapeutic Activity: Standing marching x20 Sit to stands x15; 5 of those were from table set 23 inches high off floor Self Care: Discussed death of her cat, and how to perform exercises that relieve stress from the body  [facility] Adult PT Treatment:                                                DATE: 04/16/24 Therapeutic Exercise: Hooklying marches 2x5- difficult Seated LAQ 2x10   Manual Therapy: STM to left IT band using roller- pt tender to light pressure, fluid build up in lateral thigh Neuromuscular re-ed: Standing TKE using towel against wall 2x10 left knee Posterior pelvic tilts in supine x10   Therapeutic Activity: Mini squats at stairs 2x 10   Self Care: HEP review  Taught pt how to self massage lateral leg alongside TFL/ IT band Discussed overdoing it during an exercise, how to practice mindfulness. Discussed how to create mind to muscle connections and being cognizant of how body feels     [facility] Adult PT Treatment:                                                DATE: 04/11/24 Therapeutic Exercise: Hooklying marches (<90 deg) 2x8 BIL cues for pace/velocity Seated RB hamstring curl 3x8 Seated LAQ RB 2x8  Standing heel raises x12 w UE support   Neuromuscular re-ed: Seated TKE push into ball 2x10 LLE cues for comfortable quad contraction Standing marches x15 BIL cues for posture and control  Fwd/retro stepping RLE w/ LLE as stance - x8 cues for comfortable ROM and stance limb stability, CGA and UE support PRN  Self Care: Education/discussion re: gradual progression of household activities based on tolerance and activity modification PRN   [facility] Adult PT Treatment:                                                 DATE: 04/09/24 Therapeutic Exercise/Activity: Standing Hip abd 2 x 10 Hip ext 2 x 10 HS curl 2 x 10 Hip flex 2 x 10 Step ups 6'' x 5 with 2 UE support Tandem stance 2 x 30 sec intermittent UE support 3 minute walk with SPC = 272' Seated LAQ red TB Hip abd red TB    PATIENT EDUCATION:  Education details: See treatment  Person educated: Patient Education method: Explanation and Demonstration Education comprehension: verbalized understanding and returned demonstration  HOME EXERCISE PROGRAM: Access Code: [email or web address] URL: [email or web address] Date: 04/09/2024 Prepared by: Darice Conine  Exercises - Standing Hip Abduction with Counter Support  - 1 x daily - 7 x weekly - 3 sets - 10 reps - Standing Hip Extension with Counter Support  - 1 x daily -  7 x weekly - 3 sets - 10 reps - Standing March with Counter Support  - 1 x daily - 7 x weekly - 3 sets - 10 reps - Supine Hip Abduction  - 1 x daily - 7 x weekly - 3 sets - 10 reps - Supine Heel Slide with Strap  - 1 x daily - 7 x weekly - 3 sets - 10 reps - Seated Long Arc Quad  - 1 x daily - 7 x weekly - 3 sets - 10 reps - Standing Tandem Balance with Counter Support  - 1 x daily - 7 x weekly - 1 sets - 3 reps - 30 seconds hold  ASSESSMENT:  CLINICAL IMPRESSION:  Ms Boutin is progressing well since her left hip surgery. She admits due to personal grievances that recently occurred, she has not been as active with performing her HEP as she had hoped, however is determined to return to exercises this week. Pt demonstrated improvement with sit to stands, trialing no use of UE support from table at 23 inches high off floor. Pt still requires verbal cues to engage core during exercises, esp standing activities. Pt verbally stated the soft tissue mobilization using the roller especially helped to alleviate left thigh/hip pain. Pt still has difficulty with coordination during sit to stands, requiring several verbal cues  consistently with correct muscle activation. Pt will benefit from continuing skilled therapy to address the deficits below.     Per eval: Patient is a 73 y.o. female who was seen today for physical therapy evaluation and treatment for Lt THA on 03/19/24. She had complications post op and was d/c'd from the hospital 03/22/24. She presents with impaired gait and functional mobility, impaired strength and balance and will benefit from skilled PT to address deficits and improve functional mobility and independence.   OBJECTIVE IMPAIRMENTS: Abnormal gait, decreased activity tolerance, decreased balance, decreased mobility, difficulty walking, decreased ROM, decreased strength, increased edema, increased muscle spasms, and pain.     GOALS: Goals reviewed with patient? Yes  SHORT TERM GOALS: Target date: 04/24/2024   Pt will be independent with initial HEP Baseline: Goal status: MET on 04/16/24  2.  Pt will improve Lt LE strength to 4/5 to improve standing and walking tolerance Baseline:  Goal status: MET on 04/18/24    LONG TERM GOALS: Target date: 05/22/2024   Pt will be independent with advanced HEP Baseline:  Goal status: INITIAL  2.  Pt will improve LEFS to >= 27/80 to demo improved functional mobility Baseline:  Goal status: INITIAL  3.  Pt will perform car transfers without use of leg lifter strap Baseline:  Goal status: INITIAL  4.  Pt will walk x 150' without AD with pain <= 2/10 Baseline:  Goal status: INITIAL     PLAN:  PT FREQUENCY: 2x/week  PT DURATION: 8 weeks  PLANNED INTERVENTIONS: 97164- PT Re-evaluation, 97110-Therapeutic exercises, 97530- Therapeutic activity, 97112- Neuromuscular re-education, 97535- Self Care, 02859- Manual therapy, [phone]- Gait training, [phone]- Aquatic Therapy, [phone]- Electrical stimulation (unattended), [phone] (1-2 muscles), [phone] (3+ muscles)- Dry Needling, Patient/Family education, Balance training, Stair training, Taping, Cryotherapy,  and Moist heat  PLAN FOR NEXT SESSION: Functional mobility, gait/balance as able/appropriate, IT band STM and or stretching, Terminal knee extensions, sit to stands from higher surface over 23 inches high

## 2024-04-23 ENCOUNTER — Ambulatory Visit: Payer: Medicare (Managed Care) | Admitting: Physical Therapy

## 2024-04-23 ENCOUNTER — Ambulatory Visit: Payer: Medicare (Managed Care) | Admitting: Neurology

## 2024-04-23 DIAGNOSIS — I4891 Unspecified atrial fibrillation: Secondary | ICD-10-CM | POA: Diagnosis not present

## 2024-04-23 DIAGNOSIS — I10 Essential (primary) hypertension: Secondary | ICD-10-CM | POA: Diagnosis not present

## 2024-04-24 DIAGNOSIS — L988 Other specified disorders of the skin and subcutaneous tissue: Secondary | ICD-10-CM | POA: Diagnosis not present

## 2024-04-24 DIAGNOSIS — D1801 Hemangioma of skin and subcutaneous tissue: Secondary | ICD-10-CM | POA: Diagnosis not present

## 2024-04-24 DIAGNOSIS — L720 Epidermal cyst: Secondary | ICD-10-CM | POA: Diagnosis not present

## 2024-04-24 DIAGNOSIS — L821 Other seborrheic keratosis: Secondary | ICD-10-CM | POA: Diagnosis not present

## 2024-04-24 DIAGNOSIS — D492 Neoplasm of unspecified behavior of bone, soft tissue, and skin: Secondary | ICD-10-CM | POA: Diagnosis not present

## 2024-04-24 DIAGNOSIS — L218 Other seborrheic dermatitis: Secondary | ICD-10-CM | POA: Diagnosis not present

## 2024-04-24 DIAGNOSIS — L814 Other melanin hyperpigmentation: Secondary | ICD-10-CM | POA: Diagnosis not present

## 2024-04-24 NOTE — Therapy (Signed)
 OUTPATIENT PHYSICAL THERAPY TREATMENT    Patient Name: Robin Christensen MRN: [id number] DOB:1951/01/03, 73 y.o., female Today's Date: 04/25/2024  END OF SESSION:  PT End of Session - 04/25/24 0930     Visit Number 6    Number of Visits 16    Date for Recertification  05/22/24    Authorization Type [employer]    Progress Note Due on Visit 10    PT Start Time 0933    PT Stop Time 1011    PT Time Calculation (min) 38 min                Past Medical History:  Diagnosis Date   A-fib (HCC)    Arthritis    B12 deficiency    Back pain    Breast cancer (HCC)    Cancer (HCC)    Right Breast Cancer   Chronic headaches    Dysrhythmia    AF   Edema, lower extremity    Gallbladder disease    GERD (gastroesophageal reflux disease)    Hyperlipidemia    Hypertension    Hypothyroidism    Joint pain    Obesity    Pneumonia    PONV (postoperative nausea and vomiting)    Sleep apnea    cpcap   Vitamin deficiency    Past Surgical History:  Procedure Laterality Date   BREAST SURGERY     CARDIOVERSION N/A 06/26/2014   Procedure: CARDIOVERSION;  Surgeon: Erick JONELLE Bergamo, MD;  Location: [facility] ENDOSCOPY;  Service: Cardiovascular;  Laterality: N/A;   CARDIOVERSION  09/07/2023   done at [facility]   CATARACT EXTRACTION Bilateral    CHOLECYSTECTOMY     DILATION AND CURETTAGE OF UTERUS     eyelid surgery     TOTAL HIP ARTHROPLASTY Left 03/19/2024   Procedure: ARTHROPLASTY, HIP, TOTAL, ANTERIOR APPROACH;  Surgeon: Sheril Coy, MD;  Location: [facility] ORS;  Service: Orthopedics;  Laterality: Left;   TOTAL KNEE ARTHROPLASTY Right 05/03/2016   Procedure: TOTAL KNEE ARTHROPLASTY;  Surgeon: Coy Sheril, MD;  Location: [facility] OR;  Service: Orthopedics;  Laterality: Right;   TOTAL KNEE ARTHROPLASTY Left 08/14/2018   Procedure: TOTAL KNEE ARTHROPLASTY;  Surgeon: Sheril Coy, MD;  Location: [facility] OR;  Service: Orthopedics;  Laterality: Left;   Patient Active  Problem List   Diagnosis Date Noted   Primary localized osteoarthritis of left hip 03/19/2024   S/P total left hip arthroplasty 03/19/2024   Acquired thrombophilia 02/06/2024   Hardening of the aorta (main artery of the heart) 02/06/2024   Hypothyroidism 02/06/2024   Idiopathic sleep related nonobstructive alveolar hypoventilation 02/06/2024   Long term current use of anticoagulant therapy 02/06/2024   Obstructive sleep apnea syndrome 02/06/2024   S/P knee replacement 02/06/2024   Vitamin B12 deficiency (non anemic) 02/06/2024   Other fatigue increased 06/01/2020   Atrial fibrillation (HCC) 04/23/2020   Vitamin D  deficiency 04/08/2020   Hyperglycemia 03/12/2020   Essential hypertension 03/12/2020   Mixed hyperlipidemia 01/14/2020   Lower extremity edema 01/14/2020   Depression 01/14/2020   Class 1 obesity with serious comorbidity and body mass index (BMI) of 34.0 to 34.9 in adult 01/14/2020   Primary osteoarthritis of left knee 08/14/2018   Primary localized osteoarthritis of right knee 05/03/2016   Primary osteoarthritis of right knee 05/03/2016   Abnormal auditory perception of both ears 12/01/2015    PCP: Kip Righter  REFERRING PROVIDER: Sheril Coy  REFERRING DIAG: s/p Lt THA  THERAPY DIAG:  Muscle weakness (generalized)  Difficulty in walking, not elsewhere classified  Rationale for Evaluation and Treatment: Rehabilitation  ONSET DATE: 03/19/24  SUBJECTIVE:   Per eval: Pt is s/p Lt THA on 03/19/24. She ended up spending 3 days in the hospital due to blood pressure issues. She got home on 03/22/24. She ambulates into clinic with RW. She states she has had severe Lt hip pain. She states pain is all the time, pain increases with sit to stand and lifting Lt LE. Pain eases with meds.  SUBJECTIVE STATEMENT:   04/25/2024: pt states she was a bit under the weather but feeling better today. Hip bothering her a bit more today 6-7/10, which she attributes to reduced  activity. Saw surgeon Monday - states it went well, encouraged to continue w/ PT, incision healing well. No other new updates.    PERTINENT HISTORY: Bilat TKA, afib PAIN:  Are you having pain?  Currently 6-7/10 in L hip at present, has been up to 8-9/10 in past week  Per eval:  Yes: NPRS scale: 3/10 currently, up to 9/10 at worst Pain location: Lt hip Pain description: severe Aggravating factors: transfers, lifting LT LE Relieving factors: meds  PRECAUTIONS: Fall (Per acute care MD notes in care everywhere, recommend continuing OP PT s/p hospitalization)  RED FLAGS: None   WEIGHT BEARING RESTRICTIONS: No  FALLS:  Has patient fallen in last 6 months? No  LIVING ENVIRONMENT: Lives with: lives alone Lives in: House/apartment Stairs: 1/2 step to enter Has following equipment at home: Environmental consultant - 2 wheeled  OCCUPATION: retired, enjoys walking  PLOF: Independent  PATIENT GOALS: walk without RW, decrease pain, drive  NEXT MD VISIT: TBD  OBJECTIVE:  Note: Objective measures were completed at Evaluation unless otherwise noted.  PATIENT SURVEYS:  LEFS 17/80    EDEMA:  Edema Lt LE as expected post surgery    POSTURE: weight shift right  PALPATION: Bandage intact over incision  LOWER EXTREMITY ROM:  Lt hip ROM with deficits as expected s/p Lt THA  LOWER EXTREMITY MMT:  MMT Right eval Left eval  Hip flexion  3-  Hip extension    Hip abduction    Hip adduction    Hip internal rotation    Hip external rotation    Knee flexion  3  Knee extension  3  Ankle dorsiflexion  3+  Ankle plantarflexion    Ankle inversion    Ankle eversion     (Blank rows = not tested)   FUNCTIONAL TESTS:  30 seconds chair stand test = 1 with UE support 3 minute walk test (04/09/24): 272' GAIT: Distance walked: 22' Assistive device utilized: Environmental consultant - 2 wheeled Level of assistance: Modified independence Comments: pt able to perform step through pattern, decreased stance time  on Lt    [facility] Adult PT Treatment:                                                DATE: 04/25/24  Neuromuscular re-ed: Standing cone taps 2x8 LLE, x8 RLE (deferred second set d/t fatigue) cues for motor control and posture, BIL/unilat UE support  Lateral stepping at counter 3 laps weaning UE support cues for comfortable step length, posture, weight shifting Fwd stepping at counter 2 laps no UE support, emphasis on closed chain stability and sequencing  Therapeutic Activity: Standing marches 2x10 BIL w/ UE support cues for posture/pacing Standing  heel raises 2x10 cues for symmetrical WB  Standing hip abduction 2x10 BIL cues for posture  Education on continuing AD use for stability/mechanics, activity modification, pacing of tasks     [facility] Adult PT Treatment:                                                DATE: 04/18/24 Therapeutic Exercise: AROM shoulder retraction x10 each side  Seated heel raises 2x10  Manual Therapy: STM using roller to decrease excess fluid and tissue restriction in left thigh Neuromuscular re-ed: Ball squeeze on lap for TA activation 2x10 - verbal cues for breathing properly; for postural strengthening LAQ 2x10 each side for quad activation  Standing hip abduction 2x10 Standing hamstring curl to neutral 2x5 each side Therapeutic Activity: Standing marching x20 Sit to stands x15; 5 of those were from table set 23 inches high off floor Self Care: Discussed death of her cat, and how to perform exercises that relieve stress from the body    [facility] Adult PT Treatment:                                                DATE: 04/16/24 Therapeutic Exercise: Hooklying marches 2x5- difficult Seated LAQ 2x10   Manual Therapy: STM to left IT band using roller- pt tender to light pressure, fluid build up in lateral thigh Neuromuscular re-ed: Standing TKE using towel against wall 2x10 left knee Posterior pelvic tilts in supine x10   Therapeutic Activity: Mini squats at  stairs 2x 10   Self Care: HEP review  Taught pt how to self massage lateral leg alongside TFL/ IT band Discussed overdoing it during an exercise, how to practice mindfulness. Discussed how to create mind to muscle connections and being cognizant of how body feels     [facility] Adult PT Treatment:                                                DATE: 04/11/24 Therapeutic Exercise: Hooklying marches (<90 deg) 2x8 BIL cues for pace/velocity Seated RB hamstring curl 3x8 Seated LAQ RB 2x8  Standing heel raises x12 w UE support   Neuromuscular re-ed: Seated TKE push into ball 2x10 LLE cues for comfortable quad contraction Standing marches x15 BIL cues for posture and control  Fwd/retro stepping RLE w/ LLE as stance - x8 cues for comfortable ROM and stance limb stability, CGA and UE support PRN  Self Care: Education/discussion re: gradual progression of household activities based on tolerance and activity modification PRN     PATIENT EDUCATION:  Education details: See treatment  Person educated: Patient Education method: Medical illustrator Education comprehension: verbalized understanding and returned demonstration  HOME EXERCISE PROGRAM: Access Code: [email or web address] URL: [email or web address] Date: 04/09/2024 Prepared by: Darice Conine  Exercises - Standing Hip Abduction with Counter Support  - 1 x daily - 7 x weekly - 3 sets - 10 reps - Standing Hip Extension with Counter Support  - 1 x daily - 7 x weekly - 3 sets - 10 reps - Standing March with Counter Support  - 1 x  daily - 7 x weekly - 3 sets - 10 reps - Supine Hip Abduction  - 1 x daily - 7 x weekly - 3 sets - 10 reps - Supine Heel Slide with Strap  - 1 x daily - 7 x weekly - 3 sets - 10 reps - Seated Long Arc Quad  - 1 x daily - 7 x weekly - 3 sets - 10 reps - Standing Tandem Balance with Counter Support  - 1 x daily - 7 x weekly - 1 sets - 3 reps - 30 seconds hold  ASSESSMENT:  CLINICAL  IMPRESSION:  04/25/2024: Pt arrives w/ increase in pain she attributes to reduced activity - states surgeon follow up went well. Today we increase time spent w/ postural stability and closed chain work, which she tolerates well although does require intermittent rest breaks. Reduced pain on departure (4/10 compared to 6-7/10 on arrival) and no adverse events. Recommend continuing along current POC in order to address relevant deficits and improve functional tolerance. Pt departs today's session in no acute distress, all voiced questions/concerns addressed appropriately from PT perspective.       Per eval: Patient is a 73 y.o. female who was seen today for physical therapy evaluation and treatment for Lt THA on 03/19/24. She had complications post op and was d/c'd from the hospital 03/22/24. She presents with impaired gait and functional mobility, impaired strength and balance and will benefit from skilled PT to address deficits and improve functional mobility and independence.   OBJECTIVE IMPAIRMENTS: Abnormal gait, decreased activity tolerance, decreased balance, decreased mobility, difficulty walking, decreased ROM, decreased strength, increased edema, increased muscle spasms, and pain.     GOALS: Goals reviewed with patient? Yes  SHORT TERM GOALS: Target date: 04/24/2024   Pt will be independent with initial HEP Baseline: Goal status: MET on 04/16/24  2.  Pt will improve Lt LE strength to 4/5 to improve standing and walking tolerance Baseline:  Goal status: MET on 04/18/24    LONG TERM GOALS: Target date: 05/22/2024   Pt will be independent with advanced HEP Baseline:  Goal status: INITIAL  2.  Pt will improve LEFS to >= 27/80 to demo improved functional mobility Baseline:  Goal status: INITIAL  3.  Pt will perform car transfers without use of leg lifter strap Baseline:  Goal status: INITIAL  4.  Pt will walk x 150' without AD with pain <= 2/10 Baseline:  Goal status:  INITIAL     PLAN:  PT FREQUENCY: 2x/week  PT DURATION: 8 weeks  PLANNED INTERVENTIONS: 97164- PT Re-evaluation, 97110-Therapeutic exercises, 97530- Therapeutic activity, 97112- Neuromuscular re-education, 97535- Self Care, 02859- Manual therapy, [phone]- Gait training, [phone]- Aquatic Therapy, [phone]- Electrical stimulation (unattended), [phone] (1-2 muscles), 20561 (3+ muscles)- Dry Needling, Patient/Family education, Balance training, Stair training, Taping, Cryotherapy, and Moist heat  PLAN FOR NEXT SESSION: Functional mobility, gait/balance as able/appropriate   Alm DELENA Jenny PT, DPT 04/25/2024 10:14 AM

## 2024-04-25 ENCOUNTER — Ambulatory Visit: Payer: Medicare (Managed Care) | Admitting: Physical Therapy

## 2024-04-25 ENCOUNTER — Encounter: Payer: Self-pay | Admitting: Physical Therapy

## 2024-04-25 DIAGNOSIS — M6281 Muscle weakness (generalized): Secondary | ICD-10-CM

## 2024-04-25 DIAGNOSIS — R262 Difficulty in walking, not elsewhere classified: Secondary | ICD-10-CM

## 2024-04-30 ENCOUNTER — Encounter: Payer: Self-pay | Admitting: Physical Therapy

## 2024-04-30 ENCOUNTER — Ambulatory Visit: Payer: Medicare (Managed Care) | Admitting: Physical Therapy

## 2024-04-30 DIAGNOSIS — R262 Difficulty in walking, not elsewhere classified: Secondary | ICD-10-CM

## 2024-04-30 DIAGNOSIS — M6281 Muscle weakness (generalized): Secondary | ICD-10-CM

## 2024-04-30 DIAGNOSIS — Z1231 Encounter for screening mammogram for malignant neoplasm of breast: Secondary | ICD-10-CM | POA: Diagnosis not present

## 2024-04-30 NOTE — Therapy (Signed)
 OUTPATIENT PHYSICAL THERAPY TREATMENT    Patient Name: Robin Christensen MRN: [id number] DOB:1951-04-09, 73 y.o., female Today's Date: 04/30/2024  END OF SESSION:  PT End of Session - 04/30/24 0930     Visit Number 7    Number of Visits 16    Date for Recertification  05/22/24    Authorization Type [employer]    Progress Note Due on Visit 10    PT Start Time 0930    PT Stop Time 1012    PT Time Calculation (min) 42 min    Activity Tolerance Patient tolerated treatment well    Behavior During Therapy WFL for tasks assessed/performed                 Past Medical History:  Diagnosis Date   A-fib (HCC)    Arthritis    B12 deficiency    Back pain    Breast cancer (HCC)    Cancer (HCC)    Right Breast Cancer   Chronic headaches    Dysrhythmia    AF   Edema, lower extremity    Gallbladder disease    GERD (gastroesophageal reflux disease)    Hyperlipidemia    Hypertension    Hypothyroidism    Joint pain    Obesity    Pneumonia    PONV (postoperative nausea and vomiting)    Sleep apnea    cpcap   Vitamin deficiency    Past Surgical History:  Procedure Laterality Date   BREAST SURGERY     CARDIOVERSION N/A 06/26/2014   Procedure: CARDIOVERSION;  Surgeon: Erick JONELLE Bergamo, MD;  Location: [facility] ENDOSCOPY;  Service: Cardiovascular;  Laterality: N/A;   CARDIOVERSION  09/07/2023   done at [facility]   CATARACT EXTRACTION Bilateral    CHOLECYSTECTOMY     DILATION AND CURETTAGE OF UTERUS     eyelid surgery     TOTAL HIP ARTHROPLASTY Left 03/19/2024   Procedure: ARTHROPLASTY, HIP, TOTAL, ANTERIOR APPROACH;  Surgeon: Sheril Coy, MD;  Location: [facility] ORS;  Service: Orthopedics;  Laterality: Left;   TOTAL KNEE ARTHROPLASTY Right 05/03/2016   Procedure: TOTAL KNEE ARTHROPLASTY;  Surgeon: Coy Sheril, MD;  Location: [facility] OR;  Service: Orthopedics;  Laterality: Right;   TOTAL KNEE ARTHROPLASTY Left 08/14/2018   Procedure: TOTAL KNEE  ARTHROPLASTY;  Surgeon: Sheril Coy, MD;  Location: [facility] OR;  Service: Orthopedics;  Laterality: Left;   Patient Active Problem List   Diagnosis Date Noted   Primary localized osteoarthritis of left hip 03/19/2024   S/P total left hip arthroplasty 03/19/2024   Acquired thrombophilia 02/06/2024   Hardening of the aorta (main artery of the heart) 02/06/2024   Hypothyroidism 02/06/2024   Idiopathic sleep related nonobstructive alveolar hypoventilation 02/06/2024   Long term current use of anticoagulant therapy 02/06/2024   Obstructive sleep apnea syndrome 02/06/2024   S/P knee replacement 02/06/2024   Vitamin B12 deficiency (non anemic) 02/06/2024   Other fatigue increased 06/01/2020   Atrial fibrillation (HCC) 04/23/2020   Vitamin D  deficiency 04/08/2020   Hyperglycemia 03/12/2020   Essential hypertension 03/12/2020   Mixed hyperlipidemia 01/14/2020   Lower extremity edema 01/14/2020   Depression 01/14/2020   Class 1 obesity with serious comorbidity and body mass index (BMI) of 34.0 to 34.9 in adult 01/14/2020   Primary osteoarthritis of left knee 08/14/2018   Primary localized osteoarthritis of right knee 05/03/2016   Primary osteoarthritis of right knee 05/03/2016   Abnormal auditory perception of both ears 12/01/2015    PCP:  Kip Righter  REFERRING PROVIDER: Sheril Coy  REFERRING DIAG: s/p Lt THA  THERAPY DIAG:  No diagnosis found.  Rationale for Evaluation and Treatment: Rehabilitation  ONSET DATE: 03/19/24  SUBJECTIVE:   Per eval: Pt is s/p Lt THA on 03/19/24. She ended up spending 3 days in the hospital due to blood pressure issues. She got home on 03/22/24. She ambulates into clinic with RW. She states she has had severe Lt hip pain. She states pain is all the time, pain increases with sit to stand and lifting Lt LE. Pain eases with meds.  SUBJECTIVE STATEMENT:   Pt reports left knee hurting, hasn't been out of bed in a while. She walks around the house  without her cane. She knows that's why it's hurting more, is from not using the cane.    PERTINENT HISTORY: Bilat TKA, afib PAIN:  Are you having pain?  Currently 6-7/10 in L hip at present, has been up to 8-9/10 in past week  Per eval:  Yes: NPRS scale: 5-6/10 currently, up to 8/10 at worst Pain location: Lt hip and left knee  Pain description: severe Aggravating factors: transfers, lifting LT LE Relieving factors: meds  PRECAUTIONS: Fall (Per acute care MD notes in care everywhere, recommend continuing OP PT s/p hospitalization)  RED FLAGS: None   WEIGHT BEARING RESTRICTIONS: No  FALLS:  Has patient fallen in last 6 months? No  LIVING ENVIRONMENT: Lives with: lives alone Lives in: House/apartment Stairs: 1/2 step to enter Has following equipment at home: Environmental consultant - 2 wheeled  OCCUPATION: retired, enjoys walking  PLOF: Independent  PATIENT GOALS: walk without RW, decrease pain, drive  NEXT MD VISIT: TBD  OBJECTIVE:  Note: Objective measures were completed at Evaluation unless otherwise noted.  PATIENT SURVEYS:  LEFS 17/80    EDEMA:  Edema Lt LE as expected post surgery    POSTURE: weight shift right  PALPATION: Bandage intact over incision  LOWER EXTREMITY ROM:  Lt hip ROM with deficits as expected s/p Lt THA  LOWER EXTREMITY MMT:  MMT Right eval Left eval  Hip flexion  3-  Hip extension    Hip abduction    Hip adduction    Hip internal rotation    Hip external rotation    Knee flexion  3  Knee extension  3  Ankle dorsiflexion  3+  Ankle plantarflexion    Ankle inversion    Ankle eversion     (Blank rows = not tested)   FUNCTIONAL TESTS:  30 seconds chair stand test = 1 with UE support 3 minute walk test (04/09/24): 272' GAIT: Distance walked: 84' Assistive device utilized: Environmental consultant - 2 wheeled Level of assistance: Modified independence Comments: pt able to perform step through pattern, decreased stance time on Lt  [facility] Adult PT  Treatment:                                                DATE: 04/30/24  Manual Therapy: Side lying STM to lateral side of left thigh, calf, glute med area- medium pressure due to tender points Neuromuscular re-ed: Standing cone taps 2x10  LLE, 2x10 RLE cues for motor control and posture, BIL/unilat UE support  Lateral stepping at counter 3 laps weaning UE support; second set instructed take small steps, third set instructed take large steps then compare weight shifting sensation between the  two Fwd stepping at counter 2 laps no UE support, emphasis on closed chain stability and sequencing Therapeutic Activity:  Standing hip abduction 2x10 BIL cues for posture  Seated ball press on lap for core activation 2x10 -explained correct times to inhale/exhale during an exercise  Self Care: Discussed mental rehearsal and using it to help with motivation for using cane while at home    [facility] Adult PT Treatment:                                                DATE: 04/25/24  Neuromuscular re-ed: Standing cone taps 2x10 LLE, 2x10 LE cues for motor control and posture, BIL/unilat UE support  Lateral stepping at counter 3 laps weaning UE support cues for comfortable step length, posture, weight shifting   Therapeutic Activity: Standing marches 2x10 BIL w/ UE support cues for posture/pacing Standing heel raises 2x10 cues for symmetrical WB  Standing hip abduction 2x10 BIL cues for posture  Education on continuing AD use for stability/mechanics, activity modification, pacing of tasks     [facility] Adult PT Treatment:                                                DATE: 04/18/24 Therapeutic Exercise: AROM shoulder retraction x10 each side  Seated heel raises 2x10  Manual Therapy: STM using roller to decrease excess fluid and tissue restriction in left thigh Neuromuscular re-ed: Ball squeeze on lap for TA activation 2x10 - verbal cues for breathing properly; for postural strengthening LAQ 2x10 each  side for quad activation  Standing hip abduction 2x10 Standing hamstring curl to neutral 2x5 each side Therapeutic Activity: Standing marching x20 Sit to stands x15; 5 of those were from table set 23 inches high off floor Self Care: Discussed death of her cat, and how to perform exercises that relieve stress from the body    [facility] Adult PT Treatment:                                                DATE: 04/16/24 Therapeutic Exercise: Hooklying marches 2x5- difficult Seated LAQ 2x10   Manual Therapy: STM to left IT band using roller- pt tender to light pressure, fluid build up in lateral thigh Neuromuscular re-ed: Standing TKE using towel against wall 2x10 left knee Posterior pelvic tilts in supine x10   Therapeutic Activity: Mini squats at stairs 2x 10   Self Care: HEP review  Taught pt how to self massage lateral leg alongside TFL/ IT band Discussed overdoing it during an exercise, how to practice mindfulness. Discussed how to create mind to muscle connections and being cognizant of how body feels     PATIENT EDUCATION:  Education details: See treatment  Person educated: Patient Education method: Explanation and Demonstration Education comprehension: verbalized understanding and returned demonstration  HOME EXERCISE PROGRAM: Access Code: [email or web address] URL: [email or web address] Date: 04/09/2024 Prepared by: Darice Conine  Exercises - Standing Hip Abduction with Counter Support  - 1 x daily - 7 x weekly - 3 sets - 10 reps - Standing Hip Extension with Counter Support  -  1 x daily - 7 x weekly - 3 sets - 10 reps - Standing March with Counter Support  - 1 x daily - 7 x weekly - 3 sets - 10 reps - Supine Hip Abduction  - 1 x daily - 7 x weekly - 3 sets - 10 reps - Supine Heel Slide with Strap  - 1 x daily - 7 x weekly - 3 sets - 10 reps - Seated Long Arc Quad  - 1 x daily - 7 x weekly - 3 sets - 10 reps - Standing Tandem Balance with Counter Support  - 1 x  daily - 7 x weekly - 1 sets - 3 reps - 30 seconds hold  ASSESSMENT:  CLINICAL IMPRESSION:    Pt admits her high pain level today is due from doing household chores involving walking and standing, without her cane. Incorporated patient education explaining how to implement mental rehearsal for connecting mind to desired activity with using the cane. Pt progressed balancing exercises, able to tolerate higher reps while still maintaining proper posture, however required several verbal cues to engage core. Manual therapy pt has tenderness in lateral left thigh, however stated the soft tissue massage helped to alleviate her left hip pain.    Per eval: Patient is a 73 y.o. female who was seen today for physical therapy evaluation and treatment for Lt THA on 03/19/24. She had complications post op and was d/c'd from the hospital 03/22/24. She presents with impaired gait and functional mobility, impaired strength and balance and will benefit from skilled PT to address deficits and improve functional mobility and independence.   OBJECTIVE IMPAIRMENTS: Abnormal gait, decreased activity tolerance, decreased balance, decreased mobility, difficulty walking, decreased ROM, decreased strength, increased edema, increased muscle spasms, and pain.     GOALS: Goals reviewed with patient? Yes  SHORT TERM GOALS: Target date: 04/24/2024   Pt will be independent with initial HEP Baseline: Goal status: MET on 04/16/24  2.  Pt will improve Lt LE strength to 4/5 to improve standing and walking tolerance Baseline:  Goal status: MET on 04/18/24    LONG TERM GOALS: Target date: 05/22/2024   Pt will be independent with advanced HEP Baseline:  Goal status: INITIAL  2.  Pt will improve LEFS to >= 27/80 to demo improved functional mobility Baseline:  Goal status: INITIAL  3.  Pt will perform car transfers without use of leg lifter strap Baseline:  Goal status: INITIAL  4.  Pt will walk x 150' without AD with  pain <= 2/10 Baseline:  Goal status: INITIAL     PLAN:  PT FREQUENCY: 2x/week  PT DURATION: 8 weeks  PLANNED INTERVENTIONS: 97164- PT Re-evaluation, 97110-Therapeutic exercises, 97530- Therapeutic activity, 97112- Neuromuscular re-education, 97535- Self Care, [id number]- Manual therapy, [phone]- Gait training, [phone]- Aquatic Therapy, [phone]- Electrical stimulation (unattended), [phone] (1-2 muscles), [phone] (3+ muscles)- Dry Needling, Patient/Family education, Balance training, Stair training, Taping, Cryotherapy, and Moist heat  PLAN FOR NEXT SESSION: Functional mobility, gait/balance as able/appropriate, balancing   Lavanda Cleverly, SPT 04/30/24 10:49 AM

## 2024-05-02 ENCOUNTER — Ambulatory Visit: Payer: Medicare (Managed Care)

## 2024-05-02 DIAGNOSIS — M6281 Muscle weakness (generalized): Secondary | ICD-10-CM

## 2024-05-02 DIAGNOSIS — Z96642 Presence of left artificial hip joint: Secondary | ICD-10-CM

## 2024-05-02 DIAGNOSIS — R262 Difficulty in walking, not elsewhere classified: Secondary | ICD-10-CM

## 2024-05-02 NOTE — Therapy (Signed)
 OUTPATIENT PHYSICAL THERAPY TREATMENT    Patient Name: Robin Christensen MRN: [id number] DOB:Jul 30, 1950, 73 y.o., female Today's Date: 05/02/2024  END OF SESSION:  PT End of Session - 05/02/24 0933     Visit Number 8    Number of Visits 16    Date for Recertification  05/22/24    Authorization Type [employer]    Progress Note Due on Visit 10    PT Start Time 0933    PT Stop Time 1013    PT Time Calculation (min) 40 min    Activity Tolerance Patient tolerated treatment well    Behavior During Therapy WFL for tasks assessed/performed         Past Medical History:  Diagnosis Date   A-fib (HCC)    Arthritis    B12 deficiency    Back pain    Breast cancer (HCC)    Cancer (HCC)    Right Breast Cancer   Chronic headaches    Dysrhythmia    AF   Edema, lower extremity    Gallbladder disease    GERD (gastroesophageal reflux disease)    Hyperlipidemia    Hypertension    Hypothyroidism    Joint pain    Obesity    Pneumonia    PONV (postoperative nausea and vomiting)    Sleep apnea    cpcap   Vitamin deficiency    Past Surgical History:  Procedure Laterality Date   BREAST SURGERY     CARDIOVERSION N/A 06/26/2014   Procedure: CARDIOVERSION;  Surgeon: Erick JONELLE Bergamo, MD;  Location: [facility] ENDOSCOPY;  Service: Cardiovascular;  Laterality: N/A;   CARDIOVERSION  09/07/2023   done at [facility]   CATARACT EXTRACTION Bilateral    CHOLECYSTECTOMY     DILATION AND CURETTAGE OF UTERUS     eyelid surgery     TOTAL HIP ARTHROPLASTY Left 03/19/2024   Procedure: ARTHROPLASTY, HIP, TOTAL, ANTERIOR APPROACH;  Surgeon: Sheril Coy, MD;  Location: [facility] ORS;  Service: Orthopedics;  Laterality: Left;   TOTAL KNEE ARTHROPLASTY Right 05/03/2016   Procedure: TOTAL KNEE ARTHROPLASTY;  Surgeon: Coy Sheril, MD;  Location: [facility] OR;  Service: Orthopedics;  Laterality: Right;   TOTAL KNEE ARTHROPLASTY Left 08/14/2018   Procedure: TOTAL KNEE ARTHROPLASTY;  Surgeon:  Sheril Coy, MD;  Location: [facility] OR;  Service: Orthopedics;  Laterality: Left;   Patient Active Problem List   Diagnosis Date Noted   Primary localized osteoarthritis of left hip 03/19/2024   S/P total left hip arthroplasty 03/19/2024   Acquired thrombophilia 02/06/2024   Hardening of the aorta (main artery of the heart) 02/06/2024   Hypothyroidism 02/06/2024   Idiopathic sleep related nonobstructive alveolar hypoventilation 02/06/2024   Long term current use of anticoagulant therapy 02/06/2024   Obstructive sleep apnea syndrome 02/06/2024   S/P knee replacement 02/06/2024   Vitamin B12 deficiency (non anemic) 02/06/2024   Other fatigue increased 06/01/2020   Atrial fibrillation (HCC) 04/23/2020   Vitamin D  deficiency 04/08/2020   Hyperglycemia 03/12/2020   Essential hypertension 03/12/2020   Mixed hyperlipidemia 01/14/2020   Lower extremity edema 01/14/2020   Depression 01/14/2020   Class 1 obesity with serious comorbidity and body mass index (BMI) of 34.0 to 34.9 in adult 01/14/2020   Primary osteoarthritis of left knee 08/14/2018   Primary localized osteoarthritis of right knee 05/03/2016   Primary osteoarthritis of right knee 05/03/2016   Abnormal auditory perception of both ears 12/01/2015    PCP: Kip Righter  REFERRING PROVIDER: Sheril Coy  REFERRING DIAG: s/p Lt THA  THERAPY DIAG:  Muscle weakness (generalized)  Difficulty in walking, not elsewhere classified  Status post left hip replacement  Rationale for Evaluation and Treatment: Rehabilitation  ONSET DATE: 03/19/24  SUBJECTIVE:   Per eval: Pt is s/p Lt THA on 03/19/24. She ended up spending 3 days in the hospital due to blood pressure issues. She got home on 03/22/24. She ambulates into clinic with RW. She states she has had severe Lt hip pain. She states pain is all the time, pain increases with sit to stand and lifting Lt LE. Pain eases with meds.  SUBJECTIVE STATEMENT: Patient reports 3/10 pain  in hip, states sleeping is the worst, can't get a comfortable position.   PERTINENT HISTORY: Bilat TKA, afib PAIN:  Are you having pain?  Currently 3/10 in L hip at present, 7-8/10 at worst  Per eval:  Yes: NPRS scale: 5-6/10 currently, up to 8/10 at worst Pain location: Lt hip and left knee  Pain description: severe Aggravating factors: transfers, lifting LT LE Relieving factors: meds  PRECAUTIONS: Fall (Per acute care MD notes in care everywhere, recommend continuing OP PT s/p hospitalization)  RED FLAGS: None   WEIGHT BEARING RESTRICTIONS: No  FALLS:  Has patient fallen in last 6 months? No  LIVING ENVIRONMENT: Lives with: lives alone Lives in: House/apartment Stairs: 1/2 step to enter Has following equipment at home: Environmental consultant - 2 wheeled  OCCUPATION: retired, enjoys walking  PLOF: Independent  PATIENT GOALS: walk without RW, decrease pain, drive  NEXT MD VISIT: Beginning of November  OBJECTIVE:  Note: Objective measures were completed at Evaluation unless otherwise noted.  PATIENT SURVEYS:  LEFS 17/80   EDEMA:  Edema Lt LE as expected post surgery   POSTURE: weight shift right  PALPATION: Bandage intact over incision  LOWER EXTREMITY ROM:  Lt hip ROM with deficits as expected s/p Lt THA  LOWER EXTREMITY MMT:  MMT Right eval Left eval  Hip flexion  3-  Hip extension    Hip abduction    Hip adduction    Hip internal rotation    Hip external rotation    Knee flexion  3  Knee extension  3  Ankle dorsiflexion  3+  Ankle plantarflexion    Ankle inversion    Ankle eversion     (Blank rows = not tested)   FUNCTIONAL TESTS:  30 seconds chair stand test = 1 with UE support 3 minute walk test (04/09/24): 272' GAIT: Distance walked: 66' Assistive device utilized: Environmental consultant - 2 wheeled Level of assistance: Modified independence Comments: pt able to perform step through pattern, decreased stance time on Lt   [facility] Adult PT Treatment:                                                 DATE: 05/02/2024 Manual Therapy: Roller stick Lt glutes Neuromuscular re-ed: Side Lying: Clamshells + red TB 10 x 3 sec  Straight leg raises 2x5 --> feet stacked on blocks Seated hip add ball squeezes 10 x 5 sec  Therapeutic Activity: Standing: Mini squats + hip hinge x10 Knee flexion x10 (bil) --> fingertip touch Hip abd x10 (bil) Hip extension x10 (bil) Walking with SPC Side stepping + chest press with cane Backwards walking Lt single leg balance + fingertip touch x15 sec, x 30 sec 4 step up/down Ascend/descend  4 steps + bilateral --> unilateral railing   [facility] Adult PT Treatment:                                                DATE: 04/30/24  Manual Therapy: Side lying STM to lateral side of left thigh, calf, glute med area- medium pressure due to tender points Neuromuscular re-ed: Standing cone taps 2x10  LLE, 2x10 RLE cues for motor control and posture, BIL/unilat UE support  Lateral stepping at counter 3 laps weaning UE support; second set instructed take small steps, third set instructed take large steps then compare weight shifting sensation between the two Fwd stepping at counter 2 laps no UE support, emphasis on closed chain stability and sequencing Therapeutic Activity:  Standing hip abduction 2x10 BIL cues for posture  Seated ball press on lap for core activation 2x10 -explained correct times to inhale/exhale during an exercise  Self Care: Discussed mental rehearsal and using it to help with motivation for using cane while at home    [facility] Adult PT Treatment:                                                DATE: 04/25/24 Neuromuscular re-ed: Standing cone taps 2x10 LLE, 2x10 LE cues for motor control and posture, BIL/unilat UE support  Lateral stepping at counter 3 laps weaning UE support cues for comfortable step length, posture, weight shifting   Therapeutic Activity: Standing marches 2x10 BIL w/ UE support cues for  posture/pacing Standing heel raises 2x10 cues for symmetrical WB  Standing hip abduction 2x10 BIL cues for posture  Education on continuing AD use for stability/mechanics, activity modification, pacing of tasks    PATIENT EDUCATION:  Education details: Updated HEP Person educated: Patient Education method: Medical illustrator Education comprehension: verbalized understanding and returned demonstration  HOME EXERCISE PROGRAM: Access Code: [email or web address] URL: [email or web address] Date: 05/02/2024 Prepared by: Lamarr Price  Exercises - Standing Hip Abduction with Counter Support  - 1 x daily - 7 x weekly - 3 sets - 10 reps - Standing Hip Extension with Counter Support  - 1 x daily - 7 x weekly - 3 sets - 10 reps - Standing March with Counter Support  - 1 x daily - 7 x weekly - 3 sets - 10 reps - Seated Long Arc Quad  - 1 x daily - 7 x weekly - 3 sets - 10 reps - Standing Tandem Balance with Counter Support  - 1 x daily - 7 x weekly - 1 sets - 3 reps - 30 seconds hold - Seated Iliotibial Band Mobilization With Massage Stick  - 1 x daily - 7 x weekly - 1 sets - 1 reps - 2 minutes hold - Standing Terminal Knee Extension at Wall with Ball  - 1 x daily - 7 x weekly - 3 sets - 10 reps - Clamshell  - 2 x daily - 7 x weekly - 1 sets - 10 reps - Sidelying Hip Abduction  - 2 x daily - 7 x weekly - 1 sets - 10 reps  ASSESSMENT:  CLINICAL IMPRESSION:  Patient challenged with side lying glute strengthening exercises; moderate fatigue noted with straight leg hip abduction (exercises added to HEP).  Multi-directional walking incorporated to progress dynamic balance. Incorporated stair navigation at low steps with reciprocal stepping to progress patient tolerance and endurance to perform at home; patient most challenged when descending stairs.   Pt admits her high pain level today is due from doing household chores involving walking and standing, without her cane. Incorporated  patient education explaining how to implement mental rehearsal for connecting mind to desired activity with using the cane. Pt progressed balancing exercises, able to tolerate higher reps while still maintaining proper posture, however required several verbal cues to engage core. Manual therapy pt has tenderness in lateral left thigh, however stated the soft tissue massage helped to alleviate her left hip pain.    Per eval: Patient is a 73 y.o. female who was seen today for physical therapy evaluation and treatment for Lt THA on 03/19/24. She had complications post op and was d/c'd from the hospital 03/22/24. She presents with impaired gait and functional mobility, impaired strength and balance and will benefit from skilled PT to address deficits and improve functional mobility and independence.   OBJECTIVE IMPAIRMENTS: Abnormal gait, decreased activity tolerance, decreased balance, decreased mobility, difficulty walking, decreased ROM, decreased strength, increased edema, increased muscle spasms, and pain.     GOALS: Goals reviewed with patient? Yes  SHORT TERM GOALS: Target date: 04/24/2024  Pt will be independent with initial HEP Baseline: Goal status: MET on 04/16/24  2.  Pt will improve Lt LE strength to 4/5 to improve standing and walking tolerance Baseline:  Goal status: MET on 04/18/24   LONG TERM GOALS: Target date: 05/22/2024  Pt will be independent with advanced HEP Baseline:  Goal status: INITIAL  2.  Pt will improve LEFS to >= 27/80 to demo improved functional mobility Baseline:  Goal status: INITIAL  3.  Pt will perform car transfers without use of leg lifter strap Baseline:  Goal status: MET  4.  Pt will walk x 150' without AD with pain <= 2/10 Baseline:  Goal status: INITIAL     PLAN:  PT FREQUENCY: 2x/week  PT DURATION: 8 weeks  PLANNED INTERVENTIONS: 97164- PT Re-evaluation, 97110-Therapeutic exercises, 97530- Therapeutic activity, 97112- Neuromuscular  re-education, 97535- Self Care, [id number]- Manual therapy, [phone]- Gait training, [phone]- Aquatic Therapy, [phone]- Electrical stimulation (unattended), 20560 (1-2 muscles), 20561 (3+ muscles)- Dry Needling, Patient/Family education, Balance training, Stair training, Taping, Cryotherapy, and Moist heat  PLAN FOR NEXT SESSION: Stair navigation. Functional mobility, gait/balance as able/appropriate, balancing   Lamarr Price, PTA 05/02/2024, 10:24 AM

## 2024-05-07 ENCOUNTER — Ambulatory Visit: Payer: Medicare (Managed Care)

## 2024-05-07 DIAGNOSIS — M6281 Muscle weakness (generalized): Secondary | ICD-10-CM | POA: Diagnosis not present

## 2024-05-07 DIAGNOSIS — Z96642 Presence of left artificial hip joint: Secondary | ICD-10-CM

## 2024-05-07 DIAGNOSIS — R262 Difficulty in walking, not elsewhere classified: Secondary | ICD-10-CM

## 2024-05-07 NOTE — Progress Notes (Unsigned)
 Cardiology Office Note:  .   Date:  05/08/2024  ID:  Robin Christensen, DOB August 31, 1950, MRN [id number] PCP: Kip Righter, MD  [facility] Providers Cardiologist:  Wilbert Bihari, MD   History of Present Illness: .   Robin Christensen is a 73 y.o. female with a past medical history of atrial fibrillation had breast cancer, GERD, hypertension, hyperlipidemia, sleep apnea on CPAP, and PAF here for follow-up appointment.  She was last seen August 07, 2023 by Dr. Bihari for preoperative clearance.  She was seen by Dr. Ladona for atrial fibrillation and apparently had a cardioversion in 2015.  She denied any chest pain/pressure, SOB, DOE, PND, orthopnea, lower extreme edema, dizziness, or syncope at her last appointment.  At times she does have palpitations but they only last for a few seconds and then resolved.  No further atrial fibrillation.  Compliant with medications.  I saw her March 2025, she presents, with a history of atrial fibrillation (AFib), is seeking preoperative clearance for a left hip replacement. The surgery was previously scheduled but was cancelled due to an episode of AFib. The patient experienced a prolonged AFib attack that required cardioversion in the emergency room. Following the episode, the patient reported brief periods of AFib lasting up to five minutes, but these resolved after a week. The patient's medications were not changed following the AFib episode.  The patient also reported a possible trigger for the AFib episode, including a meal high in bacon (salt) and caffeinated diet coke. The patient typically avoids caffeinated drinks due to a previous AFib episode.  [name] is significantly limited due to hip pain, and she is unable to walk one to two blocks or climb stairs. She is able to perform household tasks with difficulty and is not currently engaging in recreational activities. She does meet 4 mets on the DASI  The patient also mentioned a  potential interest in a coronary calcium  score test offered during a mammogram, as her father had coronary disease.  Reports no shortness of breath nor dyspnea on exertion. Reports no chest pain, pressure, or tightness. No edema, orthopnea, PND. Reports no palpitations.   Discussed the use of AI scribe software for clinical note transcription with the patient, who gave verbal consent to proceed.  Robin Christensen is a 73 year old female with atrial fibrillation who presents with concerns about fluid retention and swelling. She was referred by Dr. Kip for follow-up after hospitalization for suspected heart failure.  She experienced hypotension with a blood pressure of 70/50 mmHg during physical therapy, leading to an emergency room visit. This episode was associated with significant unilateral leg swelling (she is s/p left hip surgery).  Diuretics were administered for fluid retention. Initial tests suggested heart failure, but further evaluation at [facility] indicated no heart failure, with a normal echocardiogram. She was hospitalized for two and a half days.  She has atrial fibrillation, previously managed with cardioversion, and experiences occasional palpitations without recent sustained episodes. She is on Eliquis  for anticoagulation and avoids NSAIDs due to bleeding risk. She manages her condition by avoiding caffeine and not overeating.  Her weight increased from 242 to 250 pounds post-hip replacement surgery, and she is under weight management care. Her leg remains slightly swollen, attributed to post-surgical inflammation. She reports no shortness of breath or chest pain but has decreased exercise tolerance due to recent hip surgery. Her family history includes coronary artery disease. Her potassium levels, initially low after diuretic treatment, have  normalized.  Reports no shortness of breath nor dyspnea on exertion. Reports no chest pain, pressure, or tightness. No orthopnea,  PND.  Discussed the use of AI scribe software for clinical note transcription with the patient, who gave verbal consent to proceed.   ROS: Pertinent ROS in HPI  Studies Reviewed: .        None. Echo from hospital reviewed.       Physical Exam:   VS:  BP (!) 113/53 (BP Location: Left Arm, Patient Position: Sitting, Cuff Size: Large)   Pulse (!) 54   Ht 5' 8 (1.727 m)   Wt 250 lb 12.8 oz (113.8 kg)   SpO2 97%   BMI 38.13 kg/m    Wt Readings from Last 3 Encounters:  05/08/24 250 lb 12.8 oz (113.8 kg)  03/19/24 242 lb (109.8 kg)  03/08/24 240 lb (108.9 kg)    GEN: Well nourished, well developed in no acute distress NECK: No JVD; No carotid bruits CARDIAC: RRR, no murmurs, rubs, gallops RESPIRATORY:  Clear to auscultation without rales, wheezing or rhonchi  ABDOMEN: Soft, non-tender, non-distended EXTREMITIES:  + left leg edema; No deformity   ASSESSMENT AND PLAN: .    Paroxysmal atrial fibrillation, status post cardioversion No recurrence post-cardioversion. Occasional palpitations resolve spontaneously. - Advise avoidance of caffeine and large meals.  Essential hypertension Recent hypotensive episode during physical therapy.  Left hip osteoarthritis status post hip replacement with persistent left leg pain and swelling Persistent pain and swelling post-replacement. Swelling likely from fluid retention and inflammation. No clot on ultrasound. Pain worsens with excessive walking. - Continue Tylenol  arthritis for pain. - Avoid NSAIDs due to Eliquis .  Family history of coronary artery disease Discussed calcium  score options due to family history. Explained blood vs. arterial calcium . She chose self-pay CT scan. - Schedule self-pay CT calcium  score.  Dispo: She can follow-up in 3 months with Dr. Shlomo.  Signed, Orren LOISE Fabry, PA-C

## 2024-05-07 NOTE — Therapy (Signed)
 OUTPATIENT PHYSICAL THERAPY TREATMENT    Patient Name: Robin Christensen MRN: [id number] DOB:Aug 19, 1950, 73 y.o., female Today's Date: 05/07/2024  END OF SESSION:  PT End of Session - 05/07/24 0935     Visit Number 9    Number of Visits 16    Date for Recertification  05/22/24    Authorization Type [employer]    Progress Note Due on Visit 10    PT Start Time 0935    PT Stop Time 1015    PT Time Calculation (min) 40 min    Activity Tolerance Patient tolerated treatment well    Behavior During Therapy WFL for tasks assessed/performed         Past Medical History:  Diagnosis Date   A-fib (HCC)    Arthritis    B12 deficiency    Back pain    Breast cancer (HCC)    Cancer (HCC)    Right Breast Cancer   Chronic headaches    Dysrhythmia    AF   Edema, lower extremity    Gallbladder disease    GERD (gastroesophageal reflux disease)    Hyperlipidemia    Hypertension    Hypothyroidism    Joint pain    Obesity    Pneumonia    PONV (postoperative nausea and vomiting)    Sleep apnea    cpcap   Vitamin deficiency    Past Surgical History:  Procedure Laterality Date   BREAST SURGERY     CARDIOVERSION N/A 06/26/2014   Procedure: CARDIOVERSION;  Surgeon: Erick JONELLE Bergamo, MD;  Location: [facility] ENDOSCOPY;  Service: Cardiovascular;  Laterality: N/A;   CARDIOVERSION  09/07/2023   done at [facility]   CATARACT EXTRACTION Bilateral    CHOLECYSTECTOMY     DILATION AND CURETTAGE OF UTERUS     eyelid surgery     TOTAL HIP ARTHROPLASTY Left 03/19/2024   Procedure: ARTHROPLASTY, HIP, TOTAL, ANTERIOR APPROACH;  Surgeon: Sheril Coy, MD;  Location: [facility] ORS;  Service: Orthopedics;  Laterality: Left;   TOTAL KNEE ARTHROPLASTY Right 05/03/2016   Procedure: TOTAL KNEE ARTHROPLASTY;  Surgeon: Coy Sheril, MD;  Location: [facility] OR;  Service: Orthopedics;  Laterality: Right;   TOTAL KNEE ARTHROPLASTY Left 08/14/2018   Procedure: TOTAL KNEE ARTHROPLASTY;  Surgeon:  Sheril Coy, MD;  Location: [facility] OR;  Service: Orthopedics;  Laterality: Left;   Patient Active Problem List   Diagnosis Date Noted   Primary localized osteoarthritis of left hip 03/19/2024   S/P total left hip arthroplasty 03/19/2024   Acquired thrombophilia 02/06/2024   Hardening of the aorta (main artery of the heart) 02/06/2024   Hypothyroidism 02/06/2024   Idiopathic sleep related nonobstructive alveolar hypoventilation 02/06/2024   Long term current use of anticoagulant therapy 02/06/2024   Obstructive sleep apnea syndrome 02/06/2024   S/P knee replacement 02/06/2024   Vitamin B12 deficiency (non anemic) 02/06/2024   Other fatigue increased 06/01/2020   Atrial fibrillation (HCC) 04/23/2020   Vitamin D  deficiency 04/08/2020   Hyperglycemia 03/12/2020   Essential hypertension 03/12/2020   Mixed hyperlipidemia 01/14/2020   Lower extremity edema 01/14/2020   Depression 01/14/2020   Class 1 obesity with serious comorbidity and body mass index (BMI) of 34.0 to 34.9 in adult 01/14/2020   Primary osteoarthritis of left knee 08/14/2018   Primary localized osteoarthritis of right knee 05/03/2016   Primary osteoarthritis of right knee 05/03/2016   Abnormal auditory perception of both ears 12/01/2015    PCP: Kip Righter  REFERRING PROVIDER: Sheril Coy  REFERRING DIAG: s/p Lt THA  THERAPY DIAG:  Difficulty in walking, not elsewhere classified  Status post left hip replacement  Muscle weakness (generalized)  Rationale for Evaluation and Treatment: Rehabilitation  ONSET DATE: 03/19/24  SUBJECTIVE:   Per eval: Pt is s/p Lt THA on 03/19/24. She ended up spending 3 days in the hospital due to blood pressure issues. She got home on 03/22/24. She ambulates into clinic with RW. She states she has had severe Lt hip pain. She states pain is all the time, pain increases with sit to stand and lifting Lt LE. Pain eases with meds.  SUBJECTIVE STATEMENT: Patient reports a  radiating, nagging pain that runs from Rt hip down to knee, sometimes going all the way down to the ankle; states se had 10/10 over the weekend after a long shopping trip. Patient reports 6/10 pain today.   PERTINENT HISTORY: Bilat TKA, afib PAIN:  Are you having pain?  Currently 6/10 in L hip at present  Per eval:  Yes: NPRS scale: 5-6/10 currently, up to 8/10 at worst Pain location: Lt hip and left knee  Pain description: severe Aggravating factors: transfers, lifting LT LE Relieving factors: meds  PRECAUTIONS: Fall (Per acute care MD notes in care everywhere, recommend continuing OP PT s/p hospitalization)  RED FLAGS: None   WEIGHT BEARING RESTRICTIONS: No  FALLS:  Has patient fallen in last 6 months? No  LIVING ENVIRONMENT: Lives with: lives alone Lives in: House/apartment Stairs: 1/2 step to enter Has following equipment at home: Environmental consultant - 2 wheeled  OCCUPATION: retired, enjoys walking  PLOF: Independent  PATIENT GOALS: walk without RW, decrease pain, drive  NEXT MD VISIT: Beginning of November  OBJECTIVE:  Note: Objective measures were completed at Evaluation unless otherwise noted.  PATIENT SURVEYS:  LEFS 17/80   EDEMA:  Edema Lt LE as expected post surgery   POSTURE: weight shift right  PALPATION: Bandage intact over incision  LOWER EXTREMITY ROM:  Lt hip ROM with deficits as expected s/p Lt THA  LOWER EXTREMITY MMT:  MMT Right eval Left eval  Hip flexion  3-  Hip extension    Hip abduction    Hip adduction    Hip internal rotation    Hip external rotation    Knee flexion  3  Knee extension  3  Ankle dorsiflexion  3+  Ankle plantarflexion    Ankle inversion    Ankle eversion     (Blank rows = not tested)   FUNCTIONAL TESTS:  30 seconds chair stand test = 1 with UE support 3 minute walk test (04/09/24): 272' GAIT: Distance walked: 51' Assistive device utilized: Environmental consultant - 2 wheeled Level of assistance: Modified  independence Comments: pt able to perform step through pattern, decreased stance time on Lt   [facility] Adult PT Treatment:                                                DATE: 05/07/2024 Manual Therapy: STM/TPR (Lt) lumbar paraspinals, lateral coccyx, gluteals, piriformis Roller stick gluteals (Lt) Neuromuscular re-ed: Standing: Resisted hip abd + red TB 2x10  Resisted hip extension 2x10  Seated LAQ + hip add ball squeeze Side lying: Straight leg hip abd with foot propped on blocks Hip abd to extension leg raises Resisted clamshells + red TB --> recommended pillow b/w knees when doing at home Therapeutic  Activity: Prone prop on elbows --> pillow underneath stomach    [facility] Adult PT Treatment:                                                DATE: 05/02/2024 Manual Therapy: Roller stick Lt glutes Neuromuscular re-ed: Side Lying: Clamshells + red TB 10 x 3 sec  Straight leg raises 2x5 --> feet stacked on blocks Seated hip add ball squeezes 10 x 5 sec  Therapeutic Activity: Standing: Mini squats + hip hinge x10 Knee flexion x10 (bil) --> fingertip touch Hip abd x10 (bil) Hip extension x10 (bil) Walking with SPC Side stepping + chest press with cane Backwards walking Lt single leg balance + fingertip touch x15 sec, x 30 sec 4 step up/down Ascend/descend 4 steps + bilateral --> unilateral railing   [facility] Adult PT Treatment:                                                DATE: 04/30/24  Manual Therapy: Side lying STM to lateral side of left thigh, calf, glute med area- medium pressure due to tender points Neuromuscular re-ed: Standing cone taps 2x10  LLE, 2x10 RLE cues for motor control and posture, BIL/unilat UE support  Lateral stepping at counter 3 laps weaning UE support; second set instructed take small steps, third set instructed take large steps then compare weight shifting sensation between the two Fwd stepping at counter 2 laps no UE support, emphasis on closed chain  stability and sequencing Therapeutic Activity:  Standing hip abduction 2x10 BIL cues for posture  Seated ball press on lap for core activation 2x10 -explained correct times to inhale/exhale during an exercise  Self Care: Discussed mental rehearsal and using it to help with motivation for using cane while at home     PATIENT EDUCATION:  Education details: Updated HEP Person educated: Patient Education method: Medical illustrator Education comprehension: verbalized understanding and returned demonstration  HOME EXERCISE PROGRAM: Access Code: [email or web address] URL: [email or web address] Date: 05/02/2024 Prepared by: Lamarr Price  Exercises - Standing Hip Abduction with Counter Support  - 1 x daily - 7 x weekly - 3 sets - 10 reps - Standing Hip Extension with Counter Support  - 1 x daily - 7 x weekly - 3 sets - 10 reps - Standing March with Counter Support  - 1 x daily - 7 x weekly - 3 sets - 10 reps - Seated Long Arc Quad  - 1 x daily - 7 x weekly - 3 sets - 10 reps - Standing Tandem Balance with Counter Support  - 1 x daily - 7 x weekly - 1 sets - 3 reps - 30 seconds hold - Seated Iliotibial Band Mobilization With Massage Stick  - 1 x daily - 7 x weekly - 1 sets - 1 reps - 2 minutes hold - Standing Terminal Knee Extension at Wall with Ball  - 1 x daily - 7 x weekly - 3 sets - 10 reps - Clamshell  - 2 x daily - 7 x weekly - 1 sets - 10 reps - Sidelying Hip Abduction  - 2 x daily - 7 x weekly - 1 sets - 10 reps   ASSESSMENT:  CLINICAL IMPRESSION:  Significant tenderness with palpitation along left-side lumbar paraspinals; manual intervention and prop prop decreased pain symptoms (pillow placed underneath stomach with prone prop). Continued hip strengthening to tolerance to address glute weakness. Recommended patient place pillow between knees when doing clamshells at home to alleviate anterior hip discomfort.   Pt admits her high pain level today is due from doing  household chores involving walking and standing, without her cane. Incorporated patient education explaining how to implement mental rehearsal for connecting mind to desired activity with using the cane. Pt progressed balancing exercises, able to tolerate higher reps while still maintaining proper posture, however required several verbal cues to engage core. Manual therapy pt has tenderness in lateral left thigh, however stated the soft tissue massage helped to alleviate her left hip pain.    Per eval: Patient is a 73 y.o. female who was seen today for physical therapy evaluation and treatment for Lt THA on 03/19/24. She had complications post op and was d/c'd from the hospital 03/22/24. She presents with impaired gait and functional mobility, impaired strength and balance and will benefit from skilled PT to address deficits and improve functional mobility and independence.   OBJECTIVE IMPAIRMENTS: Abnormal gait, decreased activity tolerance, decreased balance, decreased mobility, difficulty walking, decreased ROM, decreased strength, increased edema, increased muscle spasms, and pain.     GOALS: Goals reviewed with patient? Yes  SHORT TERM GOALS: Target date: 04/24/2024  Pt will be independent with initial HEP Baseline: Goal status: MET on 04/16/24  2.  Pt will improve Lt LE strength to 4/5 to improve standing and walking tolerance Baseline:  Goal status: MET on 04/18/24   LONG TERM GOALS: Target date: 05/22/2024  Pt will be independent with advanced HEP Baseline:  Goal status: INITIAL  2.  Pt will improve LEFS to >= 27/80 to demo improved functional mobility Baseline:  Goal status: INITIAL  3.  Pt will perform car transfers without use of leg lifter strap Baseline:  Goal status: MET  4.  Pt will walk x 150' without AD with pain <= 2/10 Baseline:  Goal status: INITIAL     PLAN:  PT FREQUENCY: 2x/week  PT DURATION: 8 weeks  PLANNED INTERVENTIONS: 97164- PT Re-evaluation,  97110-Therapeutic exercises, 97530- Therapeutic activity, 97112- Neuromuscular re-education, 97535- Self Care, [id number]- Manual therapy, [phone]- Gait training, [phone]- Aquatic Therapy, [phone]- Electrical stimulation (unattended), 20560 (1-2 muscles), 20561 (3+ muscles)- Dry Needling, Patient/Family education, Balance training, Stair training, Taping, Cryotherapy, and Moist heat  PLAN FOR NEXT SESSION: Stair navigation. Functional mobility, gait/balance as able/appropriate, balancing   Lamarr Price, PTA 05/07/2024, 10:15 AM

## 2024-05-08 ENCOUNTER — Other Ambulatory Visit: Payer: Self-pay

## 2024-05-08 ENCOUNTER — Ambulatory Visit: Payer: Medicare (Managed Care) | Attending: Cardiology | Admitting: Physician Assistant

## 2024-05-08 ENCOUNTER — Encounter: Payer: Self-pay | Admitting: Physician Assistant

## 2024-05-08 VITALS — BP 113/53 | HR 54 | Ht 68.0 in | Wt 250.8 lb

## 2024-05-08 DIAGNOSIS — Z79899 Other long term (current) drug therapy: Secondary | ICD-10-CM

## 2024-05-08 DIAGNOSIS — I480 Paroxysmal atrial fibrillation: Secondary | ICD-10-CM

## 2024-05-08 DIAGNOSIS — M7989 Other specified soft tissue disorders: Secondary | ICD-10-CM | POA: Diagnosis not present

## 2024-05-08 DIAGNOSIS — M79605 Pain in left leg: Secondary | ICD-10-CM

## 2024-05-08 DIAGNOSIS — M1612 Unilateral primary osteoarthritis, left hip: Secondary | ICD-10-CM

## 2024-05-08 DIAGNOSIS — I10 Essential (primary) hypertension: Secondary | ICD-10-CM

## 2024-05-08 NOTE — Patient Instructions (Addendum)
 Medication Instructions:  Your physician recommends that you continue on your current medications as directed. Please refer to the Current Medication list given to you today. *If you need a refill on your cardiac medications before your next appointment, please call your pharmacy*  Lab Work: FASTING LIPID PANEL AT YOUR CONVENIENCE  If you have labs (blood work) drawn today and your tests are completely normal, you will receive your results only by: MyChart Message (if you have MyChart) OR A paper copy in the mail If you have any lab test that is abnormal or we need to change your treatment, we will call you to review the results.  Testing/Procedures: CT CALCIUM  SCORE  Follow-Up: At [facility], you and your health needs are our priority.  As part of our continuing mission to provide you with exceptional heart care, our providers are all part of one team.  This team includes your primary Cardiologist (physician) and Advanced Practice Providers or APPs (Physician Assistants and Nurse Practitioners) who all work together to provide you with the care you need, when you need it.  Your next appointment:   3 month(s)  Provider:   Wilbert Bihari, MD or Orren Fabry, PA   We recommend signing up for the patient portal called MyChart.  Sign up information is provided on this After Visit Summary.  MyChart is used to connect with patients for Virtual Visits (Telemedicine).  Patients are able to view lab/test results, encounter notes, upcoming appointments, etc.  Non-urgent messages can be sent to your provider as well.   To learn more about what you can do with MyChart, go to [email or web address].   Other Instructions

## 2024-05-10 ENCOUNTER — Ambulatory Visit: Payer: Medicare (Managed Care)

## 2024-05-10 DIAGNOSIS — R262 Difficulty in walking, not elsewhere classified: Secondary | ICD-10-CM

## 2024-05-10 DIAGNOSIS — M6281 Muscle weakness (generalized): Secondary | ICD-10-CM

## 2024-05-10 DIAGNOSIS — Z96642 Presence of left artificial hip joint: Secondary | ICD-10-CM

## 2024-05-10 NOTE — Therapy (Cosign Needed Addendum)
 OUTPATIENT PHYSICAL THERAPY TREATMENT  Progress Note Reporting Period 03/27/2024 to 05/10/2024  See note below for Objective Data and Assessment of Progress/Goals.       Patient Name: Robin Christensen MRN: [id number] DOB:June 10, 1951, 73 y.o., female Today's Date: 05/10/2024  END OF SESSION:  PT End of Session - 05/10/24 1024     Visit Number 10    Number of Visits 16    Date for Recertification  05/22/24    Authorization Type [employer]    Progress Note Due on Visit 10    PT Start Time 1022    PT Stop Time 1103    PT Time Calculation (min) 41 min    Activity Tolerance Patient tolerated treatment well    Behavior During Therapy WFL for tasks assessed/performed         Past Medical History:  Diagnosis Date   A-fib (HCC)    Arthritis    B12 deficiency    Back pain    Breast cancer (HCC)    Cancer (HCC)    Right Breast Cancer   Chronic headaches    Dysrhythmia    AF   Edema, lower extremity    Gallbladder disease    GERD (gastroesophageal reflux disease)    Hyperlipidemia    Hypertension    Hypothyroidism    Joint pain    Obesity    Pneumonia    PONV (postoperative nausea and vomiting)    Sleep apnea    cpcap   Vitamin deficiency    Past Surgical History:  Procedure Laterality Date   BREAST SURGERY     CARDIOVERSION N/A 06/26/2014   Procedure: CARDIOVERSION;  Surgeon: Erick JONELLE Bergamo, MD;  Location: [facility] ENDOSCOPY;  Service: Cardiovascular;  Laterality: N/A;   CARDIOVERSION  09/07/2023   done at [facility]   CATARACT EXTRACTION Bilateral    CHOLECYSTECTOMY     DILATION AND CURETTAGE OF UTERUS     eyelid surgery     TOTAL HIP ARTHROPLASTY Left 03/19/2024   Procedure: ARTHROPLASTY, HIP, TOTAL, ANTERIOR APPROACH;  Surgeon: Sheril Coy, MD;  Location: [facility] ORS;  Service: Orthopedics;  Laterality: Left;   TOTAL KNEE ARTHROPLASTY Right 05/03/2016   Procedure: TOTAL KNEE ARTHROPLASTY;  Surgeon: Coy Sheril, MD;  Location: [facility] OR;   Service: Orthopedics;  Laterality: Right;   TOTAL KNEE ARTHROPLASTY Left 08/14/2018   Procedure: TOTAL KNEE ARTHROPLASTY;  Surgeon: Sheril Coy, MD;  Location: [facility] OR;  Service: Orthopedics;  Laterality: Left;   Patient Active Problem List   Diagnosis Date Noted   Primary localized osteoarthritis of left hip 03/19/2024   S/P total left hip arthroplasty 03/19/2024   Acquired thrombophilia 02/06/2024   Hardening of the aorta (main artery of the heart) 02/06/2024   Hypothyroidism 02/06/2024   Idiopathic sleep related nonobstructive alveolar hypoventilation 02/06/2024   Long term current use of anticoagulant therapy 02/06/2024   Obstructive sleep apnea syndrome 02/06/2024   S/P knee replacement 02/06/2024   Vitamin B12 deficiency (non anemic) 02/06/2024   Other fatigue increased 06/01/2020   Atrial fibrillation (HCC) 04/23/2020   Vitamin D  deficiency 04/08/2020   Hyperglycemia 03/12/2020   Essential hypertension 03/12/2020   Mixed hyperlipidemia 01/14/2020   Lower extremity edema 01/14/2020   Depression 01/14/2020   Class 1 obesity with serious comorbidity and body mass index (BMI) of 34.0 to 34.9 in adult 01/14/2020   Primary osteoarthritis of left knee 08/14/2018   Primary localized osteoarthritis of right knee 05/03/2016   Primary osteoarthritis of right knee  05/03/2016   Abnormal auditory perception of both ears 12/01/2015    PCP: Kip Righter  REFERRING PROVIDER: Sheril Coy  REFERRING DIAG: s/p Lt THA  THERAPY DIAG:  Difficulty in walking, not elsewhere classified  Status post left hip replacement  Muscle weakness (generalized)  Rationale for Evaluation and Treatment: Rehabilitation  ONSET DATE: 03/19/24  SUBJECTIVE:   Per eval: Pt is s/p Lt THA on 03/19/24. She ended up spending 3 days in the hospital due to blood pressure issues. She got home on 03/22/24. She ambulates into clinic with RW. She states she has had severe Lt hip pain. She states pain is all the  time, pain increases with sit to stand and lifting Lt LE. Pain eases with meds.  SUBJECTIVE STATEMENT: Patient reports her back and hip are feeling much better compared to last visit; reports occasional twinges in hip but no pain worth mentioning.  PERTINENT HISTORY: Bilat TKA, afib PAIN:  Are you having pain?  Currently 1/10 in L hip at present  Per eval:  Yes: NPRS scale: 5-6/10 currently, up to 8/10 at worst Pain location: Lt hip and left knee  Pain description: severe Aggravating factors: transfers, lifting LT LE Relieving factors: meds  PRECAUTIONS: Fall (Per acute care MD notes in care everywhere, recommend continuing OP PT s/p hospitalization)  RED FLAGS: None   WEIGHT BEARING RESTRICTIONS: No  FALLS:  Has patient fallen in last 6 months? No  LIVING ENVIRONMENT: Lives with: lives alone Lives in: House/apartment Stairs: 1/2 step to enter Has following equipment at home: Environmental consultant - 2 wheeled  OCCUPATION: retired, enjoys walking  PLOF: Independent  PATIENT GOALS: walk without RW, decrease pain, drive  NEXT MD VISIT: Beginning of November  OBJECTIVE:  Note: Objective measures were completed at Evaluation unless otherwise noted.  PATIENT SURVEYS:  LEFS 17/80   EDEMA:  Edema Lt LE as expected post surgery   POSTURE: weight shift right  PALPATION: Bandage intact over incision  LOWER EXTREMITY ROM:  Lt hip ROM with deficits as expected s/p Lt THA  LOWER EXTREMITY MMT:  MMT Right eval Left eval  Hip flexion  3-  Hip extension    Hip abduction    Hip adduction    Hip internal rotation    Hip external rotation    Knee flexion  3  Knee extension  3  Ankle dorsiflexion  3+  Ankle plantarflexion    Ankle inversion    Ankle eversion     (Blank rows = not tested)   FUNCTIONAL TESTS:  30 seconds chair stand test = 1 with UE support 3 minute walk test (04/09/24): 272' GAIT: Distance walked: 79' Assistive device utilized: Environmental consultant - 2  wheeled Level of assistance: Modified independence Comments: pt able to perform step through pattern, decreased stance time on Lt    [facility] Adult PT Treatment:                                                DATE: 05/10/2024 Manual Therapy: Roller stick lateral thigh, ITB, gluteals Neuromuscular re-ed: Small range SLR 10 x 3 sec Hooklying hip add (ball squeeze) & abd (blue TB) isometrics 10 x 5 sec Bridges + blue TB 3x5 Side Lying: Resisted clamshell + red TB x10 Hip abduction x10  Hip abd to extension leg raises x10  Therapeutic Activity: (Lt) HS/ITB active stretches with  strap Ascend/descend 4 --> 6 step with reciprocal stepping Standing: Resisted hip abd + red TB 2x10  Resisted front <--> side foot taps on dots + red TB crossed at ankles (bil) Walking x 2 laps with no AD (160') --> no pain    [facility] Adult PT Treatment:                                                DATE: 05/07/2024 Manual Therapy: STM/TPR (Lt) lumbar paraspinals, lateral coccyx, gluteals, piriformis Roller stick gluteals (Lt) Neuromuscular re-ed: Standing: Resisted hip abd + red TB 2x10  Resisted hip extension 2x10  Seated LAQ + hip add ball squeeze Side lying: Straight leg hip abd with foot propped on blocks Hip abd to extension leg raises Resisted clamshells + red TB --> recommended pillow b/w knees when doing at home Therapeutic Activity: Prone prop on elbows --> pillow underneath stomach    [facility] Adult PT Treatment:                                                DATE: 05/02/2024 Manual Therapy: Roller stick Lt glutes Neuromuscular re-ed: Side Lying: Clamshells + red TB 10 x 3 sec  Straight leg raises 2x5 --> feet stacked on blocks Seated hip add ball squeezes 10 x 5 sec  Therapeutic Activity: Standing: Mini squats + hip hinge x10 Knee flexion x10 (bil) --> fingertip touch Hip abd x10 (bil) Hip extension x10 (bil) Walking with SPC Side stepping + chest press with cane Backwards  walking Lt single leg balance + fingertip touch x15 sec, x 30 sec 4 step up/down Ascend/descend 4 steps + bilateral --> unilateral railing   PATIENT EDUCATION:  Education details: Updated HEP Person educated: Patient Education method: Medical illustrator Education comprehension: verbalized understanding and returned demonstration  HOME EXERCISE PROGRAM: Access Code: [email or web address] URL: [email or web address] Date: 05/10/2024 Prepared by: Lamarr Price  Exercises - Standing Hip Abduction with Counter Support  - 1 x daily - 7 x weekly - 3 sets - 10 reps - Standing Hip Extension with Counter Support  - 1 x daily - 7 x weekly - 3 sets - 10 reps - Standing March with Counter Support  - 1 x daily - 7 x weekly - 3 sets - 10 reps - Seated Long Arc Quad  - 1 x daily - 7 x weekly - 3 sets - 10 reps - Standing Tandem Balance with Counter Support  - 1 x daily - 7 x weekly - 1 sets - 3 reps - 30 seconds hold - Seated Iliotibial Band Mobilization With Massage Stick  - 1 x daily - 7 x weekly - 1 sets - 1 reps - 2 minutes hold - Standing Terminal Knee Extension at Wall with Ball  - 1 x daily - 7 x weekly - 3 sets - 10 reps - Sidelying Hip Abduction  - 2 x daily - 7 x weekly - 1 sets - 10 reps - Clam with Resistance  - 1 x daily - 7 x weekly - 3 sets - 10 reps   ASSESSMENT:  CLINICAL IMPRESSION:  Patient is making good progress with goals within POC, demonstrating steady progress with hip strengthening and significant decrease in pain.  Patient has met 3/4 LTGs and is progressing HEP with added resistance and increased repetitions. Tactile cues provided to improve pelvic stability in standing with resisted hip abduction; tendency to compensate with external rotation. Patient will continue to benefit from skilled therapy to progress strength and balance deficits, endurance when walking long distances, and ability to safely navigate community.     Per eval: Patient is a 73 y.o.  female who was seen today for physical therapy evaluation and treatment for Lt THA on 03/19/24. She had complications post op and was d/c'd from the hospital 03/22/24. She presents with impaired gait and functional mobility, impaired strength and balance and will benefit from skilled PT to address deficits and improve functional mobility and independence.   OBJECTIVE IMPAIRMENTS: Abnormal gait, decreased activity tolerance, decreased balance, decreased mobility, difficulty walking, decreased ROM, decreased strength, increased edema, increased muscle spasms, and pain.     GOALS: Goals reviewed with patient? Yes  SHORT TERM GOALS: Target date: 04/24/2024  Pt will be independent with initial HEP Baseline: Goal status: MET on 04/16/24  2.  Pt will improve Lt LE strength to 4/5 to improve standing and walking tolerance Baseline:  Goal status: MET on 04/18/24   LONG TERM GOALS: Target date: 05/22/2024  Pt will be independent with advanced HEP Baseline:  Goal status: INITIAL  2.  Pt will improve LEFS to >= 27/80 to demo improved functional mobility Baseline:  05/10/24: 35/80 = 43.8% Goal status: MET  3.  Pt will perform car transfers without use of leg lifter strap Baseline:  Goal status: MET  4.  Pt will walk x 150' without AD with pain <= 2/10 Baseline:  05/10/24: 160' no pain Goal status: MET     PLAN:  PT FREQUENCY: 2x/week  PT DURATION: 8 weeks  PLANNED INTERVENTIONS: 97164- PT Re-evaluation, 97110-Therapeutic exercises, 97530- Therapeutic activity, 97112- Neuromuscular re-education, 97535- Self Care, [id number]- Manual therapy, [id number]- Gait training, [phone]- Aquatic Therapy, [phone]- Electrical stimulation (unattended), 20560 (1-2 muscles), 20561 (3+ muscles)- Dry Needling, Patient/Family education, Balance training, Stair training, Taping, Cryotherapy, and Moist heat  PLAN FOR NEXT SESSION: Functional mobility, gait/balance as able/appropriate, balancing. Hip strengthening, single  leg stability.  Lamarr Price, PTA 05/10/2024, 11:14 AM  Darice Conine, PT,DPT10/24/2512:10 PM

## 2024-05-14 ENCOUNTER — Ambulatory Visit: Payer: Medicare (Managed Care) | Admitting: Physical Therapy

## 2024-05-15 ENCOUNTER — Ambulatory Visit (HOSPITAL_COMMUNITY): Payer: Medicare (Managed Care)

## 2024-05-16 ENCOUNTER — Ambulatory Visit: Payer: Medicare (Managed Care) | Admitting: Physical Therapy

## 2024-05-16 DIAGNOSIS — N6322 Unspecified lump in the left breast, upper inner quadrant: Secondary | ICD-10-CM | POA: Diagnosis not present

## 2024-05-16 DIAGNOSIS — R928 Other abnormal and inconclusive findings on diagnostic imaging of breast: Secondary | ICD-10-CM | POA: Diagnosis not present

## 2024-05-17 ENCOUNTER — Ambulatory Visit: Payer: Medicare (Managed Care) | Admitting: Physical Therapy

## 2024-05-17 ENCOUNTER — Encounter: Payer: Self-pay | Admitting: Physical Therapy

## 2024-05-17 DIAGNOSIS — Z96642 Presence of left artificial hip joint: Secondary | ICD-10-CM

## 2024-05-17 DIAGNOSIS — R262 Difficulty in walking, not elsewhere classified: Secondary | ICD-10-CM

## 2024-05-17 DIAGNOSIS — I10 Essential (primary) hypertension: Secondary | ICD-10-CM | POA: Diagnosis not present

## 2024-05-17 DIAGNOSIS — M6281 Muscle weakness (generalized): Secondary | ICD-10-CM | POA: Diagnosis not present

## 2024-05-17 DIAGNOSIS — I4891 Unspecified atrial fibrillation: Secondary | ICD-10-CM | POA: Diagnosis not present

## 2024-05-17 NOTE — Therapy (Addendum)
 OUTPATIENT PHYSICAL THERAPY TREATMENT AND DISCHARGE     Patient Name: Robin Christensen MRN: [id number] DOB:[birth year], 73 y.o., female Today's Date: 05/17/2024  END OF SESSION:  PT End of Session - 05/17/24 1015     Visit Number 11    Number of Visits 16    Date for Recertification  05/22/24    Authorization Type [employer]age    Progress Note Due on Visit 20    PT Start Time 1016    PT Stop Time 1056    PT Time Calculation (min) 40 min          Past Medical History:  Diagnosis Date   A-fib (HCC)    Arthritis    B12 deficiency    Back pain    Breast cancer (HCC)    Cancer (HCC)    Right Breast Cancer   Chronic headaches    Dysrhythmia    AF   Edema, lower extremity    Gallbladder disease    GERD (gastroesophageal reflux disease)    Hyperlipidemia    Hypertension    Hypothyroidism    Joint pain    Obesity    Pneumonia    PONV (postoperative nausea and vomiting)    Sleep apnea    cpcap   Vitamin deficiency    Past Surgical History:  Procedure Laterality Date   BREAST SURGERY     CARDIOVERSION N/A 06/26/2014   Procedure: CARDIOVERSION;  Surgeon: Erick JONELLE Bergamo, MD;  Location: [facility] ENDOSCOPY;  Service: Cardiovascular;  Laterality: N/A;   CARDIOVERSION  09/07/2023   done at [facility]   CATARACT EXTRACTION Bilateral    CHOLECYSTECTOMY     DILATION AND CURETTAGE OF UTERUS     eyelid surgery     TOTAL HIP ARTHROPLASTY Left 03/19/2024   Procedure: ARTHROPLASTY, HIP, TOTAL, ANTERIOR APPROACH;  Surgeon: Sheril Coy, MD;  Location: [facility] ORS;  Service: Orthopedics;  Laterality: Left;   TOTAL KNEE ARTHROPLASTY Right 05/03/2016   Procedure: TOTAL KNEE ARTHROPLASTY;  Surgeon: Coy Sheril, MD;  Location: [facility] OR;  Service: Orthopedics;  Laterality: Right;   TOTAL KNEE ARTHROPLASTY Left 08/14/2018   Procedure: TOTAL KNEE ARTHROPLASTY;  Surgeon: Sheril Coy, MD;  Location: [facility] OR;  Service: Orthopedics;  Laterality: Left;   Patient Active  Problem List   Diagnosis Date Noted   Primary localized osteoarthritis of left hip 03/19/2024   S/P total left hip arthroplasty 03/19/2024   Acquired thrombophilia 02/06/2024   Hardening of the aorta (main artery of the heart) 02/06/2024   Hypothyroidism 02/06/2024   Idiopathic sleep related nonobstructive alveolar hypoventilation 02/06/2024   Long term current use of anticoagulant therapy 02/06/2024   Obstructive sleep apnea syndrome 02/06/2024   S/P knee replacement 02/06/2024   Vitamin B12 deficiency (non anemic) 02/06/2024   Other fatigue increased 06/01/2020   Atrial fibrillation (HCC) 04/23/2020   Vitamin D  deficiency 04/08/2020   Hyperglycemia 03/12/2020   Essential hypertension 03/12/2020   Mixed hyperlipidemia 01/14/2020   Lower extremity edema 01/14/2020   Depression 01/14/2020   Class 1 obesity with serious comorbidity and body mass index (BMI) of 34.0 to 34.9 in adult 01/14/2020   Primary osteoarthritis of left knee 08/14/2018   Primary localized osteoarthritis of right knee 05/03/2016   Primary osteoarthritis of right knee 05/03/2016   Abnormal auditory perception of both ears 12/01/2015    PCP: Kip Righter  REFERRING PROVIDER: Sheril Coy  REFERRING DIAG: s/p Lt THA  THERAPY DIAG:  Difficulty in walking, not elsewhere classified  Status post left hip replacement  Muscle weakness (generalized)  Rationale for Evaluation and Treatment: Rehabilitation  ONSET DATE: 03/19/24  SUBJECTIVE:   Per eval: Pt is s/p Lt THA on 03/19/24. She ended up spending 3 days in the hospital due to blood pressure issues. She got home on 03/22/24. She ambulates into clinic with RW. She states she has had severe Lt hip pain. She states pain is all the time, pain increases with sit to stand and lifting Lt LE. Pain eases with meds.  SUBJECTIVE STATEMENT: 05/17/2024: Pt states she is doing pretty well. No pain at present, arrives w/o cane. States most things around the house are  feeling pretty comfortable at present. Has also started to try picking up small objects from floor (newspaper, dog bowl). Still hasn't done stair navigation aside from home entry.     PERTINENT HISTORY: Bilat TKA, afib PAIN:  Are you having pain?  No pain at present  Per eval:  Yes: NPRS scale: 5-6/10 currently, up to 8/10 at worst Pain location: Lt hip and left knee  Pain description: severe Aggravating factors: transfers, lifting LT LE Relieving factors: meds  PRECAUTIONS: Fall (Per acute care MD notes in care everywhere, recommend continuing OP PT s/p hospitalization)  RED FLAGS: None   WEIGHT BEARING RESTRICTIONS: No  FALLS:  Has patient fallen in last 6 months? No  LIVING ENVIRONMENT: Lives with: lives alone Lives in: House/apartment Stairs: 1/2 step to enter Has following equipment at home: Environmental Consultant - 2 wheeled  OCCUPATION: retired, enjoys walking  PLOF: Independent  PATIENT GOALS: walk without RW, decrease pain, drive  NEXT MD VISIT: Beginning of November  OBJECTIVE:  Note: Objective measures were completed at Evaluation unless otherwise noted.  PATIENT SURVEYS:  LEFS 17/80   EDEMA:  Edema Lt LE as expected post surgery   POSTURE: weight shift right  PALPATION: Bandage intact over incision  LOWER EXTREMITY ROM:  Lt hip ROM with deficits as expected s/p Lt THA  LOWER EXTREMITY MMT:  MMT Right eval Left eval  Hip flexion  3-  Hip extension    Hip abduction    Hip adduction    Hip internal rotation    Hip external rotation    Knee flexion  3  Knee extension  3  Ankle dorsiflexion  3+  Ankle plantarflexion    Ankle inversion    Ankle eversion     (Blank rows = not tested)   FUNCTIONAL TESTS:  30 seconds chair stand test = 1 with UE support 3 minute walk test (04/09/24): 272' GAIT: Distance walked: 34' Assistive device utilized: Environmental Consultant - 2 wheeled Level of assistance: Modified independence Comments: pt able to perform step through  pattern, decreased stance time on Lt    [facility] Adult PT Treatment:                                                DATE: 05/17/24 Therapeutic Exercise: 8 inch step weight shifts x8 (deferred full step ups given nonpainful difficulty controlling) 4 inch step up x8 (emphasis on inc WB through LLE) HEP update + education/handout  Neuromuscular re-ed: Small ROM SLR x10 BIL  Mini bridge + blue band x8 cues for breath control and lateral hip activation Hooklying blue band hip abd 2x10 cues for form and comfortable ROM  Sidelying hip abd LLE; 2x10 cues for hip  positioning  Therapeutic Activity: Education/discussion re: functional progress, pt goals and functional mobility around the home; strategies for safe/appropriate AD weaning, utilizing step up variations as strategies to work towards goal of increased confidence w/ stair navigation    [facility] Adult PT Treatment:                                                DATE: 05/10/2024 Manual Therapy: Roller stick lateral thigh, ITB, gluteals Neuromuscular re-ed: Small range SLR 10 x 3 sec Hooklying hip add (ball squeeze) & abd (blue TB) isometrics 10 x 5 sec Bridges + blue TB 3x5 Side Lying: Resisted clamshell + red TB x10 Hip abduction x10  Hip abd to extension leg raises x10  Therapeutic Activity: (Lt) HS/ITB active stretches with strap Ascend/descend 4 --> 6 step with reciprocal stepping Standing: Resisted hip abd + red TB 2x10  Resisted front <--> side foot taps on dots + red TB crossed at ankles (bil) Walking x 2 laps with no AD (160') --> no pain    [facility] Adult PT Treatment:                                                DATE: 05/07/2024 Manual Therapy: STM/TPR (Lt) lumbar paraspinals, lateral coccyx, gluteals, piriformis Roller stick gluteals (Lt) Neuromuscular re-ed: Standing: Resisted hip abd + red TB 2x10  Resisted hip extension 2x10  Seated LAQ + hip add ball squeeze Side lying: Straight leg hip abd with foot propped on  blocks Hip abd to extension leg raises Resisted clamshells + red TB --> recommended pillow b/w knees when doing at home Therapeutic Activity: Prone prop on elbows --> pillow underneath stomach     PATIENT EDUCATION:  Education details: Updated HEP, see treatment above Person educated: Patient Education method: Explanation and Demonstration Education comprehension: verbalized understanding and returned demonstration  HOME EXERCISE PROGRAM: Access Code: [email or web address] URL: [email or web address] Date: 05/17/2024 Prepared by: Alm Jenny  Exercises - Standing Hip Abduction with Counter Support  - 1 x daily - 7 x weekly - 3 sets - 10 reps - Standing Hip Extension with Counter Support  - 1 x daily - 7 x weekly - 3 sets - 10 reps - Standing March with Counter Support  - 1 x daily - 7 x weekly - 3 sets - 10 reps - Seated Long Arc Quad  - 1 x daily - 7 x weekly - 3 sets - 10 reps - Standing Tandem Balance with Counter Support  - 1 x daily - 7 x weekly - 1 sets - 3 reps - 30 seconds hold - Standing Terminal Knee Extension at Wall with Ball  - 1 x daily - 7 x weekly - 3 sets - 10 reps - Sidelying Hip Abduction  - 2 x daily - 7 x weekly - 1 sets - 10 reps - Hooklying Clamshell with Resistance  - 1 x daily - 7 x weekly - 2 sets - 8-10 reps - Forward Step Up with Counter Support  - 1 x daily - 7 x weekly - 2 sets - 6-10 reps   ASSESSMENT:  CLINICAL IMPRESSION:  05/17/2024: Pt arrives w/ report of continued progress, no pain. Arrives w/o AD. States she wants  to trial independent HEP before formally discharging, so today we focus on maximizing self efficacy with management of familiar exercise emphasizing hip strength and mechanics. She also reports not having returned to stair navigation - unable to appropriately control 8 inch step up (consistent w her steps at home), but does well with precursor exercises as above. Education on modification PRN is provided. Pt tolerates session well  with some muscular fatigue as expected, no pain or adverse events. Per discussion w/ pt, will trial independent HEP for now but did discuss discharge policy and encouraged return should symptoms/mobility regress or not continue along current positive trajectory. Pt departs today's session in no acute distress, all voiced questions/concerns addressed appropriately from PT perspective.     Per eval: Patient is a 73 y.o. female who was seen today for physical therapy evaluation and treatment for Lt THA on 03/19/24. She had complications post op and was d/c'd from the hospital 03/22/24. She presents with impaired gait and functional mobility, impaired strength and balance and will benefit from skilled PT to address deficits and improve functional mobility and independence.   OBJECTIVE IMPAIRMENTS: Abnormal gait, decreased activity tolerance, decreased balance, decreased mobility, difficulty walking, decreased ROM, decreased strength, increased edema, increased muscle spasms, and pain.     GOALS: Goals reviewed with patient? Yes  SHORT TERM GOALS: Target date: 04/24/2024  Pt will be independent with initial HEP Baseline: Goal status: MET on 04/16/24  2.  Pt will improve Lt LE strength to 4/5 to improve standing and walking tolerance Baseline:  Goal status: MET on 04/18/24   LONG TERM GOALS: Target date: 05/22/2024  Pt will be independent with advanced HEP Baseline:  05/17/24: reports feeling confident with current HEP, updated today  Goal status: PARTIALLY MET   2.  Pt will improve LEFS to >= 27/80 to demo improved functional mobility Baseline:  05/10/24: 35/80 = 43.8% Goal status: MET  3.  Pt will perform car transfers without use of leg lifter strap Baseline:  Goal status: MET  4.  Pt will walk x 150' without AD with pain <= 2/10 Baseline:  05/10/24: 160' no pain Goal status: MET     PLAN:  PT FREQUENCY: 2x/week  PT DURATION: 8 weeks  PLANNED INTERVENTIONS: 97164- PT  Re-evaluation, 97110-Therapeutic exercises, 97530- Therapeutic activity, 97112- Neuromuscular re-education, 97535- Self Care, [id number]- Manual therapy, [phone]- Gait training, [phone]- Aquatic Therapy, [phone]- Electrical stimulation (unattended), [phone] (1-2 muscles), [phone] (3+ muscles)- Dry Needling, Patient/Family education, Balance training, Stair training, Taping, Cryotherapy, and Moist heat  PLAN FOR NEXT SESSION: per discussion w/ pt, on temporary hold for now, trial HEP and she will follow up as indicated  Alm DELENA Jenny PT, DPT 05/17/2024 11:51 AM  PHYSICAL THERAPY DISCHARGE SUMMARY  Visits from Start of Care: 11  Current functional level related to goals / functional outcomes: Improved functional mobility   Remaining deficits: See above   Education / Equipment: HEP   Patient agrees to discharge. Patient goals were met. Patient is being discharged due to being pleased with the current functional level.  Darice Conine, PT,DPT11/25/251:48 PM

## 2024-05-20 ENCOUNTER — Other Ambulatory Visit: Payer: Self-pay | Admitting: Radiology

## 2024-05-20 DIAGNOSIS — N6322 Unspecified lump in the left breast, upper inner quadrant: Secondary | ICD-10-CM | POA: Diagnosis not present

## 2024-05-22 LAB — SURGICAL PATHOLOGY

## 2024-05-31 DIAGNOSIS — R350 Frequency of micturition: Secondary | ICD-10-CM | POA: Diagnosis not present

## 2024-05-31 DIAGNOSIS — N3281 Overactive bladder: Secondary | ICD-10-CM | POA: Diagnosis not present

## 2024-05-31 DIAGNOSIS — R351 Nocturia: Secondary | ICD-10-CM | POA: Diagnosis not present

## 2024-05-31 DIAGNOSIS — N390 Urinary tract infection, site not specified: Secondary | ICD-10-CM | POA: Diagnosis not present

## 2024-06-16 DIAGNOSIS — E785 Hyperlipidemia, unspecified: Secondary | ICD-10-CM | POA: Diagnosis not present

## 2024-06-16 DIAGNOSIS — E039 Hypothyroidism, unspecified: Secondary | ICD-10-CM | POA: Diagnosis not present

## 2024-06-16 DIAGNOSIS — I4891 Unspecified atrial fibrillation: Secondary | ICD-10-CM | POA: Diagnosis not present

## 2024-06-16 DIAGNOSIS — I10 Essential (primary) hypertension: Secondary | ICD-10-CM | POA: Diagnosis not present

## 2024-06-17 DIAGNOSIS — E559 Vitamin D deficiency, unspecified: Secondary | ICD-10-CM | POA: Diagnosis not present

## 2024-06-17 DIAGNOSIS — E039 Hypothyroidism, unspecified: Secondary | ICD-10-CM | POA: Diagnosis not present

## 2024-06-17 DIAGNOSIS — Z0000 Encounter for general adult medical examination without abnormal findings: Secondary | ICD-10-CM | POA: Diagnosis not present

## 2024-06-17 DIAGNOSIS — Z23 Encounter for immunization: Secondary | ICD-10-CM | POA: Diagnosis not present

## 2024-06-17 DIAGNOSIS — E785 Hyperlipidemia, unspecified: Secondary | ICD-10-CM | POA: Diagnosis not present

## 2024-06-17 DIAGNOSIS — R300 Dysuria: Secondary | ICD-10-CM | POA: Diagnosis not present

## 2024-06-17 DIAGNOSIS — R7303 Prediabetes: Secondary | ICD-10-CM | POA: Diagnosis not present

## 2024-06-17 DIAGNOSIS — E538 Deficiency of other specified B group vitamins: Secondary | ICD-10-CM | POA: Diagnosis not present

## 2024-06-17 DIAGNOSIS — D6869 Other thrombophilia: Secondary | ICD-10-CM | POA: Diagnosis not present

## 2024-06-17 DIAGNOSIS — I10 Essential (primary) hypertension: Secondary | ICD-10-CM | POA: Diagnosis not present

## 2024-06-17 DIAGNOSIS — M8580 Other specified disorders of bone density and structure, unspecified site: Secondary | ICD-10-CM | POA: Diagnosis not present

## 2024-06-17 DIAGNOSIS — I4891 Unspecified atrial fibrillation: Secondary | ICD-10-CM | POA: Diagnosis not present

## 2024-06-25 ENCOUNTER — Other Ambulatory Visit: Payer: Self-pay | Admitting: Family Medicine

## 2024-06-25 DIAGNOSIS — M8580 Other specified disorders of bone density and structure, unspecified site: Secondary | ICD-10-CM

## 2024-07-14 DIAGNOSIS — Z7982 Long term (current) use of aspirin: Secondary | ICD-10-CM | POA: Diagnosis not present

## 2024-07-14 DIAGNOSIS — Z853 Personal history of malignant neoplasm of breast: Secondary | ICD-10-CM | POA: Diagnosis not present

## 2024-07-14 DIAGNOSIS — Z79890 Hormone replacement therapy (postmenopausal): Secondary | ICD-10-CM | POA: Diagnosis not present

## 2024-07-14 DIAGNOSIS — R42 Dizziness and giddiness: Secondary | ICD-10-CM | POA: Diagnosis not present

## 2024-07-14 DIAGNOSIS — Z79899 Other long term (current) drug therapy: Secondary | ICD-10-CM | POA: Diagnosis not present

## 2024-07-14 DIAGNOSIS — I4891 Unspecified atrial fibrillation: Secondary | ICD-10-CM | POA: Diagnosis not present

## 2024-07-14 DIAGNOSIS — S8012XA Contusion of left lower leg, initial encounter: Secondary | ICD-10-CM | POA: Diagnosis not present

## 2024-08-02 NOTE — Progress Notes (Signed)
 " Cardiology Office Note:  .   Date:  08/05/2024  ID:  Robin Christensen, DOB 1951/02/20, MRN [id number] PCP: Robin Righter, MD  [facility] Providers Cardiologist:  Wilbert Bihari, MD   History of Present Illness: .   Robin Christensen is a 74 y.o. female with a past medical history of atrial fibrillation had breast cancer, GERD, hypertension, hyperlipidemia, sleep apnea on CPAP, and PAF here for follow-up appointment.  She was last seen August 07, 2023 by Dr. Bihari for preoperative clearance.  She was seen by Dr. Ladona for atrial fibrillation and apparently had a cardioversion in 2015.  She denied any chest pain/pressure, SOB, DOE, PND, orthopnea, lower extreme edema, dizziness, or syncope at her last appointment.  At times she does have palpitations but they only last for a few seconds and then resolved.  No further atrial fibrillation.  Compliant with medications.  I saw her March 2025, she presents, with a history of atrial fibrillation (AFib), is seeking preoperative clearance for a left hip replacement. The surgery was previously scheduled but was cancelled due to an episode of AFib. The patient experienced a prolonged AFib attack that required cardioversion in the emergency room. Following the episode, the patient reported brief periods of AFib lasting up to five minutes, but these resolved after a week. The patient's medications were not changed following the AFib episode.  The patient also reported a possible trigger for the AFib episode, including a meal high in bacon (salt) and caffeinated diet coke. The patient typically avoids caffeinated drinks due to a previous AFib episode.  [name] is significantly limited due to hip pain, and she is unable to walk one to two blocks or climb stairs. She is able to perform household tasks with difficulty and is not currently engaging in recreational activities. She does meet 4 mets on the DASI  The patient also mentioned a  potential interest in a coronary calcium  score test offered during a mammogram, as her father had coronary disease.  Reports no shortness of breath nor dyspnea on exertion. Reports no chest pain, pressure, or tightness. No edema, orthopnea, PND. Reports no palpitations.   Discussed the use of AI scribe software for clinical note transcription with the patient, who gave verbal consent to proceed.  Robin Christensen is a 74 year old female with atrial fibrillation who presents with concerns about fluid retention and swelling. She was referred by Dr. Kip for follow-up after hospitalization for suspected heart failure.  She experienced hypotension with a blood pressure of 70/50 mmHg during physical therapy, leading to an emergency room visit. This episode was associated with significant unilateral leg swelling (she is s/p left hip surgery).  Diuretics were administered for fluid retention. Initial tests suggested heart failure, but further evaluation at [facility] indicated no heart failure, with a normal echocardiogram. She was hospitalized for two and a half days.  She has atrial fibrillation, previously managed with cardioversion, and experiences occasional palpitations without recent sustained episodes. She is on Eliquis  for anticoagulation and avoids NSAIDs due to bleeding risk. She manages her condition by avoiding caffeine and not overeating.  Her weight increased from 242 to 250 pounds post-hip replacement surgery, and she is under weight management care. Her leg remains slightly swollen, attributed to post-surgical inflammation. She reports no shortness of breath or chest pain but has decreased exercise tolerance due to recent hip surgery. Her family history includes coronary artery disease. Her potassium levels, initially low after diuretic treatment,  have normalized.  Reports no shortness of breath nor dyspnea on exertion. Reports no chest pain, pressure, or tightness. No orthopnea,  PND.  Discussed the use of AI scribe software for clinical note transcription with the patient, who gave verbal consent to proceed.  Today, she presents with a hc of Afib, HTN and HLD with a hematoma on her left lower leg following a fall from bed.  On December 28, she fell from bed at 6:00 AM while dreaming, struck a bedside table, and landed on the floor, resulting in a large, painful hematoma on her left leg. She used compression and ice at home. The hematoma has slowly decreased in size and pain. She was told the clot was in the tissue rather than a vein. X-ray showed no fracture. She is seeing an orthopedic doctor.  She had hip replacement surgery complicated by severe pain, fluid retention with 30-pound weight gain requiring hospitalization for fluid management and physical therapy. She takes Eliquis  without bleeding issues (stable hemoglobin on recent labs) and low-dose Crestor  10 mg without adverse effects. Recent December labs showed A1c 5.7 and LDL 72. She has no chest fluttering or fluid retention currently.  Reports no shortness of breath nor dyspnea on exertion. Reports no chest pain, pressure, or tightness. No orthopnea, PND. Reports no palpitations.   Discussed the use of AI scribe software for clinical note transcription with the patient, who gave verbal consent to proceed.  ROS: Pertinent ROS in HPI  Studies Reviewed: .        None. Echo from hospital reviewed.       Physical Exam:   VS:  BP 126/84   Pulse 66   Wt 267 lb 3.2 oz (121.2 kg)   SpO2 96%   BMI 40.63 kg/m    Wt Readings from Last 3 Encounters:  08/05/24 267 lb 3.2 oz (121.2 kg)  05/08/24 250 lb 12.8 oz (113.8 kg)  03/19/24 242 lb (109.8 kg)    GEN: Well nourished, well developed in no acute distress NECK: No JVD; No carotid bruits CARDIAC: RRR, + soft systolic murmur, rubs, gallops RESPIRATORY:  Clear to auscultation without rales, wheezing or rhonchi  ABDOMEN: Soft, non-tender,  non-distended EXTREMITIES:  + left leg edema and visible hematoma   ASSESSMENT AND PLAN: .    Subcutaneous hematoma of lower extremity Subcutaneous hematoma on the left lower extremity is reducing in size, indicating reabsorption. No fractures or signs of deep vein thrombosis. Conservative management is effective. - Continue conservative management with heat, ice, and compression. - Encouraged walking or swimming for low-impact exercise.  Paroxysmal atrial fibrillation Managed with Eliquis . Blood pressure well-controlled. EKG shows normal sinus rhythm. - Continue Eliquis  as prescribed.  Essential hypertension Well-controlled with current medication regimen. Recent blood pressure readings satisfactory. - Continue current antihypertensive medications.  Family history of coronary artery disease Discussed calcium  score options due to family history. She chose self-pay CT scan. - Schedule self-pay CT calcium  score in the near future once her hematoma has resolved  Dispo: She can follow-up in 6 months with Dr. Shlomo  Signed, Robin LOISE Fabry, PA-C   "

## 2024-08-05 ENCOUNTER — Ambulatory Visit: Payer: Medicare (Managed Care) | Admitting: Physician Assistant

## 2024-08-05 ENCOUNTER — Ambulatory Visit: Payer: Medicare (Managed Care) | Attending: Physician Assistant | Admitting: Physician Assistant

## 2024-08-05 ENCOUNTER — Encounter: Payer: Self-pay | Admitting: Physician Assistant

## 2024-08-05 VITALS — BP 126/84 | HR 66 | Wt 267.2 lb

## 2024-08-05 DIAGNOSIS — Z01810 Encounter for preprocedural cardiovascular examination: Secondary | ICD-10-CM | POA: Diagnosis not present

## 2024-08-05 DIAGNOSIS — I480 Paroxysmal atrial fibrillation: Secondary | ICD-10-CM

## 2024-08-05 DIAGNOSIS — Z79899 Other long term (current) drug therapy: Secondary | ICD-10-CM

## 2024-08-05 DIAGNOSIS — I10 Essential (primary) hypertension: Secondary | ICD-10-CM

## 2024-08-05 DIAGNOSIS — Z01818 Encounter for other preprocedural examination: Secondary | ICD-10-CM | POA: Diagnosis not present

## 2024-08-05 MED ORDER — APIXABAN 5 MG PO TABS: 5.0000 mg | ORAL_TABLET | Freq: Two times a day (BID) | ORAL | 0 refills | Status: AC

## 2024-08-05 MED ORDER — ROSUVASTATIN CALCIUM 10 MG PO TABS: 10.0000 mg | ORAL_TABLET | Freq: Every day | ORAL | 3 refills | Status: AC

## 2024-08-05 MED ORDER — FUROSEMIDE 40 MG PO TABS: 40.0000 mg | ORAL_TABLET | Freq: Every day | ORAL | 3 refills | Status: AC

## 2024-08-05 NOTE — Patient Instructions (Signed)
 Medication Instructions:  None  *If you need a refill on your cardiac medications before your next appointment, please call your pharmacy*  Lab Work: None  If you have labs (blood work) drawn today and your tests are completely normal, you will receive your results only by: MyChart Message (if you have MyChart) OR A paper copy in the mail If you have any lab test that is abnormal or we need to change your treatment, we will call you to review the results.  Testing/Procedures: None   Follow-Up: At [facility], you and your health needs are our priority.  As part of our continuing mission to provide you with exceptional heart care, our providers are all part of one team.  This team includes your primary Cardiologist (physician) and Advanced Practice Providers or APPs (Physician Assistants and Nurse Practitioners) who all work together to provide you with the care you need, when you need it.  Your next appointment:   6 month(s)  Provider:   Wilbert Bihari, MD    We recommend signing up for the patient portal called MyChart.  Sign up information is provided on this After Visit Summary.  MyChart is used to connect with patients for Virtual Visits (Telemedicine).  Patients are able to view lab/test results, encounter notes, upcoming appointments, etc.  Non-urgent messages can be sent to your provider as well.   To learn more about what you can do with MyChart, go to [email or web address].   Other Instructions None

## 2024-10-14 ENCOUNTER — Ambulatory Visit: Payer: Medicare (Managed Care) | Admitting: Neurology
